# Patient Record
Sex: Male | Born: 1960 | Race: White | Hispanic: No | Marital: Single | Smoking: Never smoker
Health system: Southern US, Community
[De-identification: ages and names within clinical notes are randomized; demographics above are authoritative.]

## PROBLEM LIST (undated history)

## (undated) DIAGNOSIS — K859 Acute pancreatitis without necrosis or infection, unspecified: Secondary | ICD-10-CM

## (undated) DIAGNOSIS — Z86711 Personal history of pulmonary embolism: Secondary | ICD-10-CM

## (undated) DIAGNOSIS — E119 Type 2 diabetes mellitus without complications: Secondary | ICD-10-CM

## (undated) DIAGNOSIS — E785 Hyperlipidemia, unspecified: Secondary | ICD-10-CM

## (undated) DIAGNOSIS — N289 Disorder of kidney and ureter, unspecified: Secondary | ICD-10-CM

## (undated) DIAGNOSIS — R748 Abnormal levels of other serum enzymes: Secondary | ICD-10-CM

## (undated) DIAGNOSIS — I509 Heart failure, unspecified: Secondary | ICD-10-CM

## (undated) DIAGNOSIS — K219 Gastro-esophageal reflux disease without esophagitis: Secondary | ICD-10-CM

## (undated) DIAGNOSIS — I219 Acute myocardial infarction, unspecified: Secondary | ICD-10-CM

## (undated) DIAGNOSIS — E1142 Type 2 diabetes mellitus with diabetic polyneuropathy: Secondary | ICD-10-CM

## (undated) DIAGNOSIS — G4733 Obstructive sleep apnea (adult) (pediatric): Secondary | ICD-10-CM

## (undated) DIAGNOSIS — I10 Essential (primary) hypertension: Secondary | ICD-10-CM

## (undated) HISTORY — PX: VASECTOMY: SHX75

## (undated) HISTORY — DX: Disorder of kidney and ureter, unspecified: N28.9

## (undated) HISTORY — DX: Obstructive sleep apnea (adult) (pediatric): G47.33

## (undated) HISTORY — DX: Type 2 diabetes mellitus without complications: E11.9

## (undated) HISTORY — DX: Hyperlipidemia, unspecified: E78.5

## (undated) HISTORY — PX: CARDIAC SURGERY: SHX584

## (undated) HISTORY — DX: Personal history of pulmonary embolism: Z86.711

## (undated) HISTORY — DX: Acute pancreatitis without necrosis or infection, unspecified: K85.90

## (undated) HISTORY — DX: Gastro-esophageal reflux disease without esophagitis: K21.9

## (undated) HISTORY — DX: Type 2 diabetes mellitus with diabetic polyneuropathy: E11.42

## (undated) HISTORY — DX: Abnormal levels of other serum enzymes: R74.8

## (undated) HISTORY — DX: Essential (primary) hypertension: I10

---

## 2004-03-04 ENCOUNTER — Other Ambulatory Visit: Payer: Self-pay

## 2004-03-26 ENCOUNTER — Other Ambulatory Visit: Payer: Self-pay

## 2004-03-26 ENCOUNTER — Inpatient Hospital Stay: Payer: Self-pay | Admitting: Internal Medicine

## 2004-03-28 ENCOUNTER — Other Ambulatory Visit: Payer: Self-pay

## 2004-05-22 ENCOUNTER — Inpatient Hospital Stay: Payer: Self-pay

## 2004-06-01 ENCOUNTER — Inpatient Hospital Stay: Payer: Self-pay | Admitting: Psychiatry

## 2004-11-21 ENCOUNTER — Ambulatory Visit: Payer: Self-pay

## 2004-12-22 ENCOUNTER — Ambulatory Visit: Payer: Self-pay

## 2005-04-21 ENCOUNTER — Other Ambulatory Visit: Payer: Self-pay

## 2005-04-21 ENCOUNTER — Inpatient Hospital Stay: Payer: Self-pay | Admitting: Psychiatry

## 2005-04-30 ENCOUNTER — Ambulatory Visit: Payer: Self-pay | Admitting: Unknown Physician Specialty

## 2005-05-24 ENCOUNTER — Ambulatory Visit: Payer: Self-pay | Admitting: Unknown Physician Specialty

## 2005-06-24 HISTORY — PX: TRACHEOSTOMY: SUR1362

## 2005-07-05 ENCOUNTER — Inpatient Hospital Stay: Payer: Self-pay | Admitting: Internal Medicine

## 2005-10-03 ENCOUNTER — Ambulatory Visit: Payer: Self-pay | Admitting: Family Medicine

## 2005-10-15 ENCOUNTER — Ambulatory Visit: Payer: Self-pay | Admitting: Specialist

## 2005-10-22 ENCOUNTER — Ambulatory Visit: Payer: Self-pay | Admitting: Family Medicine

## 2005-10-31 ENCOUNTER — Ambulatory Visit: Payer: Self-pay | Admitting: Family Medicine

## 2006-03-12 ENCOUNTER — Inpatient Hospital Stay: Payer: Self-pay

## 2006-03-12 ENCOUNTER — Other Ambulatory Visit: Payer: Self-pay

## 2006-03-25 ENCOUNTER — Emergency Department: Payer: Self-pay | Admitting: Emergency Medicine

## 2006-04-09 ENCOUNTER — Ambulatory Visit: Payer: Self-pay | Admitting: Cardiothoracic Surgery

## 2006-05-29 ENCOUNTER — Ambulatory Visit: Payer: Self-pay | Admitting: Internal Medicine

## 2006-06-03 ENCOUNTER — Ambulatory Visit: Payer: Self-pay | Admitting: Unknown Physician Specialty

## 2006-06-09 ENCOUNTER — Inpatient Hospital Stay: Payer: Self-pay | Admitting: *Deleted

## 2006-06-30 ENCOUNTER — Ambulatory Visit: Payer: Self-pay | Admitting: Unknown Physician Specialty

## 2006-07-12 ENCOUNTER — Ambulatory Visit: Payer: Self-pay | Admitting: Otolaryngology

## 2006-10-27 ENCOUNTER — Other Ambulatory Visit: Payer: Self-pay

## 2006-10-28 ENCOUNTER — Observation Stay: Payer: Self-pay | Admitting: Internal Medicine

## 2007-09-06 ENCOUNTER — Inpatient Hospital Stay: Payer: Self-pay | Admitting: Internal Medicine

## 2007-09-30 ENCOUNTER — Ambulatory Visit: Payer: Self-pay | Admitting: Family Medicine

## 2007-10-05 ENCOUNTER — Ambulatory Visit: Payer: Self-pay | Admitting: Family Medicine

## 2008-01-29 ENCOUNTER — Ambulatory Visit: Payer: Self-pay | Admitting: Family Medicine

## 2008-06-11 ENCOUNTER — Emergency Department: Payer: Self-pay | Admitting: Emergency Medicine

## 2008-10-17 ENCOUNTER — Encounter: Payer: Self-pay | Admitting: Internal Medicine

## 2008-10-20 ENCOUNTER — Encounter: Payer: Self-pay | Admitting: Pulmonary Disease

## 2008-10-22 ENCOUNTER — Encounter: Payer: Self-pay | Admitting: Internal Medicine

## 2009-01-27 ENCOUNTER — Encounter: Payer: Self-pay | Admitting: Family Medicine

## 2009-02-01 ENCOUNTER — Inpatient Hospital Stay: Payer: Self-pay | Admitting: *Deleted

## 2009-02-01 ENCOUNTER — Ambulatory Visit: Payer: Self-pay | Admitting: Cardiovascular Disease

## 2009-08-13 ENCOUNTER — Emergency Department: Payer: Self-pay | Admitting: Unknown Physician Specialty

## 2010-01-22 ENCOUNTER — Ambulatory Visit: Payer: Self-pay

## 2010-05-01 ENCOUNTER — Inpatient Hospital Stay: Payer: Self-pay | Admitting: Internal Medicine

## 2011-12-24 ENCOUNTER — Ambulatory Visit: Payer: Self-pay | Admitting: Pain Medicine

## 2012-01-29 ENCOUNTER — Ambulatory Visit: Payer: Self-pay | Admitting: Pain Medicine

## 2012-04-03 ENCOUNTER — Inpatient Hospital Stay: Payer: Self-pay | Admitting: Internal Medicine

## 2012-04-03 ENCOUNTER — Inpatient Hospital Stay: Payer: Self-pay | Admitting: Psychiatry

## 2012-04-03 DIAGNOSIS — I10 Essential (primary) hypertension: Secondary | ICD-10-CM

## 2012-04-03 LAB — BEHAVIORAL MEDICINE 1 PANEL
Albumin: 3.5 g/dL (ref 3.4–5.0)
Alkaline Phosphatase: 58 U/L (ref 50–136)
BUN: 16 mg/dL (ref 7–18)
Basophil #: 0.1 10*3/uL (ref 0.0–0.1)
Calcium, Total: 8.1 mg/dL — ABNORMAL LOW (ref 8.5–10.1)
EGFR (Non-African Amer.): >60
Eosinophil #: 0.2 10*3/uL (ref 0.0–0.7)
Eosinophil %: 3.1 %
Glucose: 110 mg/dL — ABNORMAL HIGH (ref 65–99)
Lymphocyte #: 2 10*3/uL (ref 1.0–3.6)
MCHC: 35 g/dL (ref 32.0–36.0)
MCV: 95 fL (ref 80–100)
Monocyte %: 8.8 %
Neutrophil %: 58 %
Osmolality: 274 (ref 275–301)
Platelet: 218 10*3/uL (ref 150–440)
Potassium: 4.2 mmol/L (ref 3.5–5.1)
RBC: 3.56 10*6/uL — ABNORMAL LOW (ref 4.40–5.90)
SGOT(AST): 41 U/L — ABNORMAL HIGH (ref 15–37)
SGPT (ALT): 32 U/L (ref 12–78)
Sodium: 136 mmol/L (ref 136–145)
Thyroid Stimulating Horm: 1.24 u[IU]/mL
Total Protein: 6.6 g/dL (ref 6.4–8.2)
WBC: 6.8 10*3/uL (ref 3.8–10.6)

## 2012-04-03 LAB — CARBAMAZEPINE LEVEL, TOTAL: Carbamazepine: 10.8 ug/mL (ref 4.0–12.0)

## 2012-04-03 LAB — ETHANOL: Ethanol %: 0.099 % — ABNORMAL HIGH (ref 0.000–0.080)

## 2013-08-14 ENCOUNTER — Emergency Department: Payer: Self-pay | Admitting: Emergency Medicine

## 2013-08-14 LAB — COMPREHENSIVE METABOLIC PANEL
ALBUMIN: 3.6 g/dL (ref 3.4–5.0)
ALT: 34 U/L (ref 12–78)
AST: 26 U/L (ref 15–37)
Alkaline Phosphatase: 130 U/L — ABNORMAL HIGH
Anion Gap: 7 (ref 7–16)
BUN: 13 mg/dL (ref 7–18)
Bilirubin,Total: 0.3 mg/dL (ref 0.2–1.0)
CALCIUM: 8.8 mg/dL (ref 8.5–10.1)
CO2: 25 mmol/L (ref 21–32)
CREATININE: 0.68 mg/dL (ref 0.60–1.30)
Chloride: 95 mmol/L — ABNORMAL LOW (ref 98–107)
EGFR (African American): >60
Glucose: 267 mg/dL — ABNORMAL HIGH (ref 65–99)
Osmolality: 265 (ref 275–301)
POTASSIUM: 4.4 mmol/L (ref 3.5–5.1)
Sodium: 127 mmol/L — ABNORMAL LOW (ref 136–145)
Total Protein: 7.3 g/dL (ref 6.4–8.2)

## 2013-08-14 LAB — URINALYSIS, COMPLETE
BLOOD: NEGATIVE
Bacteria: NONE SEEN
Bilirubin,UR: NEGATIVE
Glucose,UR: >=500 mg/dL (ref 0–75)
KETONE: NEGATIVE
LEUKOCYTE ESTERASE: NEGATIVE
NITRITE: NEGATIVE
Ph: 5 (ref 4.5–8.0)
Protein: NEGATIVE
RBC,UR: <1 /HPF (ref 0–5)
Specific Gravity: 1.013 (ref 1.003–1.030)
Squamous Epithelial: NONE SEEN
WBC UR: 1 /HPF (ref 0–5)

## 2013-08-14 LAB — CBC
HCT: 38 % — ABNORMAL LOW (ref 40.0–52.0)
HGB: 13.5 g/dL (ref 13.0–18.0)
MCH: 30.1 pg (ref 26.0–34.0)
MCHC: 35.4 g/dL (ref 32.0–36.0)
MCV: 85 fL (ref 80–100)
PLATELETS: 223 10*3/uL (ref 150–440)
RBC: 4.48 10*6/uL (ref 4.40–5.90)
RDW: 13.4 % (ref 11.5–14.5)
WBC: 6.7 10*3/uL (ref 3.8–10.6)

## 2013-08-14 LAB — PRO B NATRIURETIC PEPTIDE: B-Type Natriuretic Peptide: 257 pg/mL — ABNORMAL HIGH (ref 0–125)

## 2013-08-14 LAB — TROPONIN I

## 2013-09-15 ENCOUNTER — Ambulatory Visit: Payer: Self-pay

## 2014-03-29 ENCOUNTER — Ambulatory Visit: Payer: Self-pay | Admitting: Family Medicine

## 2014-04-18 ENCOUNTER — Ambulatory Visit: Payer: Self-pay | Admitting: Internal Medicine

## 2014-05-02 ENCOUNTER — Institutional Professional Consult (permissible substitution): Payer: Medicare Other | Admitting: Internal Medicine

## 2014-05-13 ENCOUNTER — Encounter: Payer: Self-pay | Admitting: Pulmonary Disease

## 2014-05-16 ENCOUNTER — Encounter: Payer: Self-pay | Admitting: Pulmonary Disease

## 2014-05-16 ENCOUNTER — Ambulatory Visit (INDEPENDENT_AMBULATORY_CARE_PROVIDER_SITE_OTHER): Payer: Medicare Other | Admitting: Pulmonary Disease

## 2014-05-16 VITALS — BP 122/70 | HR 67 | Temp 98.2°F | Ht 75.0 in | Wt 360.6 lb

## 2014-05-16 DIAGNOSIS — J398 Other specified diseases of upper respiratory tract: Secondary | ICD-10-CM | POA: Insufficient documentation

## 2014-05-16 DIAGNOSIS — J9610 Chronic respiratory failure, unspecified whether with hypoxia or hypercapnia: Secondary | ICD-10-CM

## 2014-05-16 DIAGNOSIS — E662 Morbid (severe) obesity with alveolar hypoventilation: Secondary | ICD-10-CM | POA: Insufficient documentation

## 2014-05-16 DIAGNOSIS — Z9911 Dependence on respirator [ventilator] status: Secondary | ICD-10-CM | POA: Insufficient documentation

## 2014-05-16 NOTE — Assessment & Plan Note (Signed)
The patient has a history of tracheal stenosis dating back to his episode of chronic vent dependence after acute respiratory failure. He has been evaluated [facility] at [facility], and is felt to need a permanent trach unless he wishes to try more aggressive tracheal resection surgery.

## 2014-05-16 NOTE — Assessment & Plan Note (Signed)
I do not have specific records on the patient, but he did have an elevated serum bicarbonate in August of this year, and has the body habitus that would predispose him to obesity hypoventilation. I suspect this is why he requires mechanical ventilation at night. I would like to get his settings from his home care company, and also check an arterial blood gas first thing in the morning to see if we need to make any adjustments. I suspect he will no longer need the ventilator if he is able to lose significant weight. He obviously is going to need aggressive diuresis, and I will leave that to his primary care physician in [place] who can better monitor his renal function and weights.

## 2014-05-16 NOTE — Progress Notes (Signed)
   Subjective:    Patient ID: [name], male    DOB: 02-Nov-1960, 53 y.o.   MRN: [id number]  HPI The patient is a 53 year old male who I've been asked to see for management of chronic respiratory failure. The patient has a history of severe obstructive sleep apnea, and approximately 7 years ago had an episode of acute respiratory failure that required endotracheal intubation and mechanical ventilation. Those records are not available for my review, and the patient was ventilator dependent 4 months and sent to a chronic care facility. He was ultimately sent home without a tracheostomy, and was evaluated by otolaryngology in [place] and found to have tracheal stenosis. It was felt that he would require a permanent tracheostomy. He was ultimately seen at [facility] where the possibility of a tracheal resection was discussed. These records are unavailable as well, but the patient states they decided on more conservative surgery first. These were unsuccessful, and now the patient is left with a permanent trach unless he decides to go with aggressive tracheal resection surgery. During this time he developed acute on chronic respiratory failure, and was started on mechanical ventilation during sleep only. From his description, it sounds like he was diagnosed with obesity hypoventilation syndrome, and he did have an elevated serum bicarbonate to support this. There are no arterial blood gases available. He has had a recent echocardiogram that showed a normal ejection fraction with left ventricular hypertrophy. He was found to have a normal right ventricle, but the report mentions a membranous VSD. He had mild left atrial enlargement and mitral regurgitation. The patient has never smoked. He gets his ventilator care through [facility], and we will get his current settings sent to us.   Review of Systems  Constitutional: Positive for unexpected weight change. Negative for fever.  HENT: Negative for congestion, dental  problem, ear pain, nosebleeds, postnasal drip, rhinorrhea, sinus pressure, sneezing, sore throat and trouble swallowing.   Eyes: Negative for redness and itching.  Respiratory: Positive for shortness of breath. Negative for cough, chest tightness and wheezing.   Cardiovascular: Negative for palpitations and leg swelling.  Gastrointestinal: Negative for nausea and vomiting.  Genitourinary: Negative for dysuria.  Musculoskeletal: Negative for joint swelling.  Skin: Negative for rash.  Neurological: Negative for headaches.  Hematological: Does not bruise/bleed easily.  Psychiatric/Behavioral: Positive for dysphoric mood. The patient is not nervous/anxious.        Objective:   Physical Exam Constitutional:  Morbidly obese male, no acute distress  HENT:  Nares patent without discharge  Oropharynx without exudate, palate and uvula are thick and elongated.  Eyes:  Perrla, eomi, no scleral icterus  Neck:  No JVD, no TMG, clean trach site with no drainage.   Cardiovascular:  Normal rate, regular rhythm, no rubs or gallops.  No murmurs        Intact distal pulses but decreased.   Pulmonary :  Normal breath sounds, no stridor or respiratory distress   No rales, rhonchi, or wheezing  Abdominal:  Soft, nondistended, bowel sounds present.  No tenderness noted.   Musculoskeletal:  3+ lower extremity edema noted.  Lymph Nodes:  No cervical lymphadenopathy noted  Skin:  No cyanosis noted  Neurologic:  Alert, appropriate, moves all 4 extremities without obvious deficit.         Assessment & Plan:

## 2014-05-16 NOTE — Patient Instructions (Signed)
Will get your information from [facility] regarding your ventilator settings. Will check early morning blood gas at [facility] to see if your carbon dioxide level is overly elevated.  If so, we can make appropriate ventilator changes. Will send a note to your primary physician recommending more aggressive fluid removal, but will need to watch your kidney function closely.  Work on weight loss.  If you are able to lose weight, you will not require the ventilator at night.  followup with me again in 6mos.

## 2014-05-18 ENCOUNTER — Telehealth: Payer: Self-pay | Admitting: Pulmonary Disease

## 2014-05-18 NOTE — Telephone Encounter (Signed)
Noted  

## 2014-05-25 ENCOUNTER — Ambulatory Visit: Payer: Self-pay | Admitting: Pulmonary Disease

## 2014-05-27 ENCOUNTER — Telehealth: Payer: Self-pay | Admitting: Pulmonary Disease

## 2014-05-27 NOTE — Telephone Encounter (Signed)
Let pt know that his ABG looks very good.  No vent setting changes are needed.

## 2014-05-30 ENCOUNTER — Telehealth: Payer: Self-pay | Admitting: Pulmonary Disease

## 2014-05-30 NOTE — Telephone Encounter (Signed)
Pt states he needs to know where he stands as far as [facility] contacting his PCP to discuss fluid retention treatment and to be more aggressive with it to get and keep off the extra fluids. Pt states he has not heard from his PCP and would like to know if and when [facility] contacted the PCP. Pt aware I will send message to [facility] to advise.

## 2014-05-30 NOTE — Telephone Encounter (Signed)
A note has been sent to PCP.  He needs to keep track of his fluid status with daily weights as we discussed, and let his PCP know when weight or swelling is increasing.  Needs to CALL PCP

## 2014-05-30 NOTE — Telephone Encounter (Signed)
Called, spoke with pt.  Discussed below per Dr. Shelle Ironlance with pt.  He verbalized understanding, is in agreement with plan, and reports he will call PCP and voiced no further questions or concerns at this time.

## 2014-05-30 NOTE — Telephone Encounter (Signed)
I spoke with patient about results and he verbalized understanding and had no questions 

## 2014-07-27 ENCOUNTER — Ambulatory Visit: Payer: Self-pay | Admitting: Family Medicine

## 2014-07-29 ENCOUNTER — Emergency Department: Payer: Self-pay | Admitting: Emergency Medicine

## 2014-07-29 LAB — CBC
HCT: 38.3 % — ABNORMAL LOW (ref 40.0–52.0)
HGB: 12.8 g/dL — AB (ref 13.0–18.0)
MCH: 29.6 pg (ref 26.0–34.0)
MCHC: 33.4 g/dL (ref 32.0–36.0)
MCV: 88 fL (ref 80–100)
PLATELETS: 234 10*3/uL (ref 150–440)
RBC: 4.33 10*6/uL — ABNORMAL LOW (ref 4.40–5.90)
RDW: 13.4 % (ref 11.5–14.5)
WBC: 6.5 10*3/uL (ref 3.8–10.6)

## 2014-07-29 LAB — BASIC METABOLIC PANEL
ANION GAP: 10 (ref 7–16)
BUN: 27 mg/dL — ABNORMAL HIGH (ref 7–18)
CHLORIDE: 93 mmol/L — AB (ref 98–107)
CO2: 27 mmol/L (ref 21–32)
CREATININE: 1.36 mg/dL — AB (ref 0.60–1.30)
Calcium, Total: 9.1 mg/dL (ref 8.5–10.1)
EGFR (African American): >60
EGFR (Non-African Amer.): 58 — ABNORMAL LOW
Glucose: 335 mg/dL — ABNORMAL HIGH (ref 65–99)
Osmolality: 279 (ref 275–301)
POTASSIUM: 4.4 mmol/L (ref 3.5–5.1)
Sodium: 130 mmol/L — ABNORMAL LOW (ref 136–145)

## 2014-07-29 LAB — TROPONIN I: Troponin-I: <0.02 ng/mL

## 2014-07-29 LAB — PRO B NATRIURETIC PEPTIDE: B-TYPE NATIURETIC PEPTID: 110 pg/mL (ref 0–125)

## 2014-07-29 LAB — D-DIMER(ARMC): D-DIMER: 252 ng/mL

## 2014-10-11 NOTE — H&P (Signed)
PATIENT NAME:  [name], [name] MR#:  [id number] DATE OF BIRTH:  [birth year]  DATE OF ADMISSION:  04/03/2012  PRIMARY CARE PHYSICIAN: [name] Aldo, MD  HISTORY OF PRESENT ILLNESS: [name] is a 54 year old morbidly obese Caucasian gentleman with multiple medical problems who comes to the Emergency Room for alcohol detox. He was admitted on the Behavior Medicine unit, however, after admission, Internal Medicine was called and requested to transfer the patient to the medical floor given his multiple medical problems including chronic tracheostomy requiring suctioning as needed and felt the patient would be served better on the medical floor. Hence, the patient is being transferred to the medical floor for alcohol detox which will be managed by Dr. Toni [name], his psychiatrist. The patient currently denies any medical symptoms. He did have a couple of falls after he had drank too much alcohol. He is complaining of some aches and pains. He is now being admitted on the medical floor.   PAST MEDICAL HISTORY:  1. Chronic obstructive pulmonary disease with chronic respiratory failure, chronic tracheostomy.  2. Obstructive sleep apnea and history of tracheal stenosis status post tracheostomy. The patient uses BiPAP at home.  3. Diabetes type 2, insulin requiring.  4. Gastroesophageal reflux disease.  5. Bipolar disorder and depression.  6. History of methicillin-resistant Staphylococcus aureus.  7. History of pancreatitis.  8. Coronary artery disease status post stent placement.  9. History of congestive heart failure and pulmonary hypertension.  10. Chronic pain. The patient was seen at the pain clinic by [name] in June of 2013. Opioids are not recommended for him.  ALLERGIES:  Keflex, hydrochlorothiazide, metolazone, penicillin, pine nuts.  MEDICATIONS:  1. Venlafaxine extended-release 1 capsule once a day.  2. Omeprazole 20 mg daily.  3. Insulin sliding scale.  4. Insulin N long-acting 45 units twice  a day. 5. Metoprolol tartrate 100 mg twice a day. 6. Metformin 1000 mg extended-release twice a day.  7. Ibuprofen 200 mg 1 tablet as needed. 8. Gabapentin 1600 mg twice a day.  9. Furosemide 80 mg twice a day and as needed.  10. Enalapril 10 mg twice a day. 11. Crestor 10 mg at bedtime.  12. Plavix 75 mg daily.  13. Carbamazepine 200 mg once in the morning and 2 tablets at bedtime.   SOCIAL HISTORY: The patient lives at home with his girlfriend. He does not smoke but drinks alcohol routinely He did not quantify. He does not use any other drugs.   FAMILY HISTORY: Positive for hypertension.   REVIEW OF SYSTEMS: CONSTITUTIONAL: No fever, fatigue, or weakness. EYES: No blurred or double vision. ENT: No tinnitus, ear pain, or hearing loss. RESPIRATORY: No cough, wheeze, or hemoptysis. Chronic obstructive pulmonary disease/chronic respiratory failure status post tracheostomy. CARDIOVASCULAR: No chest pain, orthopnea, or edema. Positive for hypertension. GI: No nausea, vomiting, diarrhea, or abdominal pain. GU: No dysuria or hematuria. ENDOCRINE: No polyuria or nocturia. HEMATOLOGY: No anemia or easy bruising. SKIN: No acne or rash. MUSCULOSKELETAL: Positive for arthritis. NEUROLOGIC: No cerebrovascular accident or transient ischemic attack. PSYCH: No anxiety. Positive for depression. All other systems reviewed and negative.   RESULTS: White count 6.8, hemoglobin and hematocrit 11.9 and 33.9, and platelet count 218. Glucose 110, BUN 16, creatinine 1.05, sodium 136, potassium 4.2, chloride 97, and bicarbonate 274. Bilirubin is 8.1, SGOT 41, and albumin 3.5. TSH 1.24. Serum Tegretol 10.8.   CT of the head without contrast shows no evidence of acute or focal abnormality.  Serum ethanol level was 0.099.  ASSESSMENT: 54 year old [name] with multiple medical problems who comes to the Emergency Room with alcohol abuse and voluntarily was admitted for alcohol detox. He was initially on the Behavioral  Medicine unit, however, given his comorbidities and medical issues it was felt the patient would be better served on the medicine floor. He is being admitted with:  1. Chronic respiratory failure, with tracheostomy, remains stable. We will continue the patient's p.r.n. suctioning as needed.  2. Obstructive sleep apnea, on BiPAP at home at bedtime. I discussed with a respiratory therapist. We will set up BiPAP as per the patient's home settings. The patient is asked to have a family member bring his own BiPAP machine.  3. Hypertension. Continue home medications.  4. Type 2 diabetes, on Novolin N and sliding scale. We will continue along with metformin.  5. Alcohol intoxication and dependence. Orders to be placed by Dr. Toni Amendlapacs. Initially the patient was admitted on the Behavioral Medicine unit. I did inform the psych supervisor, [name], to inform Dr. Toni Amendlapacs to place alcohol detox orders.  6. History of bipolar disorder, depression, and obsessive-compulsive disorder. We will continue inpatient psych medications which are Tegretol and Effexor.  7. Morbid Obesity. 8. Deep vein thrombosis prophylaxis with heparin subcutaneous three times daily.  Further work-up according to the patient's clinical course. No family members were present in the hospital.   TIME SPENT: 50 minutes.  ____________________________ [name] HailSona A. Allena KatzPatel, MD sap:slb D: 04/03/2012 16:42:25 ET T: 04/03/2012 17:18:26 ET JOB#: [id number]  cc: [name] Zieske A. Allena KatzPatel, MD, <Dictator> [name] "Maryruth HancockSallie" Ean Gettel, MD [name] OraSONA A Roney Youtz MD ELECTRONICALLY SIGNED 04/03/2012 21:00

## 2014-10-11 NOTE — Consult Note (Signed)
Details:    - Psychiatry: Met with patient again after speaking with his girlfriend. Patient is through with detox. I have explained that we do not have rehab services in our hospital and that at this point his goal should be to continue to say off alcohol, which can be done with outpt assistance. This would be best in his case given his continued physical limitations and needs which will make it very diffacult for him to be cared for except in a nursing facility or in his home where they have home health and his home equipment. Patient states he is motivated to stop drinking and that he understands that continued drinking puts him at risk of dying. He has been urged to participate in [facility] programs and to get help through [facility] [place].They have an intensive outpatient program or regular counceling for SA treatment. Patient denies any suicidal ideation and is calm and lucid and expresses optimism about staaying sober.   Electronic Signatures: Gonzella Lex (MD)  (Signed 15-Oct-13 17:40)  Authored: Details   Last Updated: 15-Oct-13 17:40 by Gonzella Lex (MD)

## 2014-10-11 NOTE — Consult Note (Signed)
Brief Consult Note: Diagnosis: alcohol dep.   Patient was seen by consultant.   Recommend further assessment or treatment.   Orders entered.   Discussed with Attending MD.   Comments: Psychiatry: Thank you to Dr Allena KatzPatel for taking this patient to medical service. Primarily in the hospital for alcohol withdrawl but with multiple severe medical problems making management difficult on behavior health. Patient denies suicidal ideation and is not psychotic. Insight is fairly good and he is cooperative with treatment. No evidence at this time to change his baaseline medications for treatment of his bipolar disorder . Patient has a place to live and outpt medical care in place. Unlikely to need [facility]. Will follow. Please call us if needed during hospitalization. Detox orders for CIWA protocol done.  Electronic Signatures: Audery Amellapacs, Jahmere Bramel T (MD)  (Signed 12-Oct-13 02:34)  Authored: Brief Consult Note   Last Updated: 12-Oct-13 02:34 by Audery Amellapacs, Arvis Zwahlen T (MD)

## 2014-10-11 NOTE — Consult Note (Signed)
Brief Consult Note: Diagnosis: alcohol dep.   Patient was seen by consultant.   Consult note dictated.   Recommend further assessment or treatment.   Orders entered.   Discussed with Attending MD.   Comments: Psychiatry: Pt seen. Note done for H&P. Admit voluntary to psych for alcohol detox.  Electronic Signatures: Audery Amellapacs, John T (MD)  (Signed 11-Oct-13 11:06)  Authored: Brief Consult Note   Last Updated: 11-Oct-13 11:06 by Audery Amellapacs, John T (MD)

## 2014-10-11 NOTE — H&P (Signed)
PATIENT NAME:  [name], [name] MR#:  [id number] DATE OF BIRTH:  06-26-1960  DATE OF ADMISSION:  04/03/2012  IDENTIFYING INFORMATION AND CHIEF COMPLAINT: This is a 54 year old man with a history of multiple medical problems and substance abuse and possible bipolar disorder who is admitted to the Emergency Room initially for treatment of falls. Evaluation for alcohol abuse.   CHIEF COMPLAINT: "Yeah, I am an alcoholic."   HISTORY OF PRESENT ILLNESS: Information obtained from the patient and from the chart. Patient reports that his drinking has been increasing recently. He says that he drinks about a pint of gin every day. He feels like it is causing increasing problem for him, making it more difficult for him to walk and take care of himself. He feels like it is probably making his mood worse. He has felt helpless to try and stop it. Recently he has had several falls at home and he admits that the alcohol is probably at least partially to blame. He came into the Emergency Room today because he had fallen while trying to get up and go to the bathroom at home.   PAST PSYCHIATRIC HISTORY: Patient has a long history of alcohol dependence. He denies having had seizures or delirium tremens but reports having had sickness, nausea and tremor with stopping. He says he has been able to stop for up to three years at a time and does have a past history of residential treatment. He denies that he abuses other drugs. Additionally, he has a history of mood disorder. Possibly bipolar disorder. He claims that he had a full-blown psychotic manic episode many years ago when [place]. Since then he has not had a repeat of a manic episode but has had episodes of depression. He has been treated by Dr. Sherin QuarryWeissman and currently is on Tegretol for his mood stabilization as well as Effexor. He denies a history of suicide attempts, denies a history of violence. He has been on Depakote in the past as well.   PAST MEDICAL  HISTORY: Multiple medical problems. Patient is morbidly obese. Has diabetes, insulin-dependent. Also has a history of coronary artery disease status post stents. He has gastric reflux symptoms and chronic pain and dyslipidemia. He also has a history of tracheal stenosis which has resulted in his having a permanent tracheostomy. He needs a BiPAP machine to sleep at night. Patient lives with his girlfriend and says that at home they are able to do all of his home care. He says normally he is able to get around with a walker at home although he has gotten weaker recently.   SOCIAL HISTORY: Lives with girlfriend. Not employed. Social life currently minimal.   MEDICATIONS ON ADMISSION:  1. Effexor plain 75 mg per day.  2. Omeprazole 20 mg per day.  3. Novolin insulin 45 units twice a day.  4. Metoprolol 100 mg twice a day.  5. Metformin 500 mg twice a day.  6. Motrin 200 mg per day as needed for pain.  7. Gabapentin 1600 mg twice a day.  8. Lasix 80 mg twice a day.  9. Enalapril 10 mg twice a day.  10. Crestor 10 mg per day.  11. Plavix 75 mg per day.  12. Carbamazepine 200 mg per day.   ALLERGIES: Cephalexin, hydrochlorothiazide, metolazone, penicillin.   REVIEW OF SYSTEMS: Patient complains of chronic pain primarily in his hands and feet. He says that he has a burning pain and also a numbness in a stocking and glove  distribution. It has been gradually getting worse. He has been going to the pain clinic and now more recently going for narcotic pain management through the [facility]. He says his mood is mildly depressed. Denies suicidal ideation. Denies psychotic symptoms. Says his sleep is poor and interrupted. He feels weak. Chronic numbness and pain in his hands and feet. Feels somewhat hopeless.   MENTAL STATUS EXAM: Chronically ill looking man interviewed in the Emergency Room. He was easy to awake and was cooperative for the most part with the interview. He is only able to speak soft  and with some difficulty covering up his tracheostomy but made a good attempt. Psychomotor activity normal given his medical condition. Affect slightly blunted. Mood stated as being down. Thoughts are lucid with no evidence of loosening of associations or delusional thinking. Denies hallucinations. Denies suicidal or homicidal thinking. Short and long-term memory grossly intact. Insight and judgment adequate. Baseline intelligence normal.   PHYSICAL EXAMINATION:  GENERAL: Patient is overweight at 335 pounds. He is chronically ill appearing. Moves with a great deal of difficulty. He has no identifiable acute skin lesions. He obviously has a tracheostomy in place, it does not appear to be infected.   HEENT: Pupils are equal and reactive. Face is symmetric. Oral mucosa dry.   NECK: Nontender.   MUSCULOSKELETAL: Patient has decreased range of motion at all extremities. Upper extremities are a little bit better. He has really minimal active use of his legs. His strength in his upper extremities is largely normal. Reflexes difficult to obtain. In lower extremities he has decreased muscle strength throughout. Decreased to absent reflexes. He has decreased to minimal sensation to touch in both of his hands and his feet midway up through the calf in a symmetric pattern.    NEUROLOGICAL: His cranial nerves are symmetric and normal.   LUNGS: Clear with no wheezes.   HEART: Regular rate and rhythm.   ABDOMEN: Soft, nontender, normal bowel sounds.   CURRENT VITAL SIGNS: Temperature 98.9, pulse 77, respirations 24, blood pressure 145/83.   ASSESSMENT: This is a 54 year old man with a history of alcohol dependence who has been escalating his drinking. He also is using narcotic pain medicine and while it is clear that he is prescribed it I also see some information in his old chart suggesting that the pain clinic doctors have felt that he probably should not be using narcotics. He is having more difficulty  functioning at home with frequent falls. In addition to his medical problems this is probably at least partially related to his substance abuse. Needs hospitalization for detox and stabilization.   TREATMENT PLAN: Admit to psychiatry. Continue all of his previous medicines. After thinking about it I have decided to cut down on his narcotics to 5 mg Percocet every four hours instead of the 10 mg that he was taking. He will be continued on other psychiatric medicine. Detox protocol is in place. Patient currently is requiring a great deal of nursing care. Nursing staff on the behavioral health unit are concerned about the safety and appropriateness of his treatment on behavioral health. We will get a consult from medicine to evaluate whether he would be better served on the medical ward. Because of his history of MRSA and his limited physical activity he will probably not be able to participate actively in groups outside the unit. Daily therapy and evaluation will be done and we work then on discharge planning.   DIAGNOSIS PRINCIPLE AND PRIMARY:  AXIS I:  Alcohol dependence.   SECONDARY DIAGNOSES:  AXIS I: Bipolar disorder, not otherwise specified    AXIS II: Deferred.   AXIS III:  1. Obesity. 2. Coronary artery disease. 3. Diabetes on insulin. 4. Chronic pain. 5. High blood pressure. 6. Dyslipidemia.  7. Gastric reflux symptoms.  8. Tracheostomy. 9. History of being MRSA positive.   AXIS IV: Severe from his burden of multiple illnesses and limited support.   AXIS V: Functioning at time of evaluation is 40.  ____________________________ Audery Amel, MD jtc:cms D: 04/03/2012 14:50:03 ET T: 04/03/2012 15:07:53 ET JOB#: [id number]  cc: Audery Amel, MD, <Dictator> Audery Amel MD ELECTRONICALLY SIGNED 04/03/2012 17:02

## 2014-10-11 NOTE — Consult Note (Signed)
Details:    - Psychiatry: Patient seen. He is showing normal vital signs and no sign of delirium. Not trenulous. He has a slightly blunted affect but does not appear severely depressed. Denies any suicidal ideation. Is able to sit out of med.   Patient says he does not feel like he is ready to be discharged. He feels like he needs to stay in the hospital longer "to recover from my injuries". He complains of still being sore all over and not able to ambulate as he normally would. I do note that he is still getting fairly frequent doses of prn ativan given per the detox protocol. The protocol runs through the end of today.  As he is still getting detox meds he is not ready for discharge home yet. After being off ativan there will be no psychiatric need for him to still be in the hospital. If he really can't physically care for himself normally in a safe manner at home I'd suggest consideration be given to sending him to a rehab facility. No other acute change to psych treatment. Showing no sign of mania or depression.   Electronic Signatures: Audery Amellapacs, John T (MD)  (Signed 15-Oct-13 11:26)  Authored: Details   Last Updated: 15-Oct-13 11:26 by Audery Amellapacs, John T (MD)

## 2014-10-11 NOTE — Discharge Summary (Signed)
PATIENT NAME:  [name], [name] MR#:  [id number] DATE OF BIRTH:  [birth year]  DATE OF ADMISSION:  04/03/2012 DATE OF DISCHARGE:  04/07/2012  PRIMARY CARE PHYSICIAN: None local.  CONSULTANTS: [name] RasmussenJohn Clapacs, MD - Behavior Medicine.   DISCHARGE DIAGNOSES:  1. Alcohol intoxication and dependence status post detox.  2. Obstructive sleep apnea.  3. Chronic respiratory failure with tracheostomy. 4. Obesity. 5. Weakness.   CONDITION: Stable.   HOME MEDICATIONS:  1. Gabapentin 800 mg p.o. 2 tablets twice a day. 2. Lasix 80 mg p.o. twice a day p.r.n. for weight gain.  3. Metformin 500 mg p.o. 2 tablets twice a day. 4. Enalapril 10 mg p.o. twice a day. 5. Venlafaxine 75 mg p.o. daily. 6. Ibuprofen 200 mg p.o. p.r.n.  7. Crestor 10 mg p.o. at bedtime.  8. Lopressor 100 mg p.o. twice a day. 9. Carbamazepine 200 mg p.o. one tablet in the morning and two tablets at bedtime.  10. Plavix 75 mg p.o. daily.  11. Omeprazole 20 mg p.o. daily.  12. Novolin N 45 units subcutaneous in the morning and at bedtime.  13. Percocet 10/325 mg p.o. 5 times a day p.r.n. for pain.  REFERRALS: The patient needs home physical therapy due to weakness and enabling him to walk.  DIET: Low fat, low cholesterol diet.   ACTIVITY: As tolerated.   FOLLOW-UP CARE: Follow-up with PCP within 1 to 2 weeks. Follow-up with Dr. Toni [name] within 1 week.   HOSPITAL COURSE: The patient is a 54 year old Caucasian male with history of morbid obesity and obstructive sleep apnea who came to the ED for alcohol detox. He was admitted to the Behavior Medicine unit; however, after admission internal medicine was called and requested to transfer the patient to the medical floor due to his multiple medical problems including chronic tracheostomy requiring suctioning p.r.n. So the patient was admitted to the medical service. After admission the patient was treated for detox by Dr. Toni [name]. The patient also has chronic respiratory failure with  tracheostomy, which is stable. The patient has OSA and is on BiPAP at home at bedtime and the patient was treated with the same.  Hypertension has been stable.   Diabetes has been treated with Novolin and sliding scale and metformin.   After admission, the patient complained of weakness and difficulty walking. Physical therapy evaluated the patient and suggested home physical therapy. The patient is clinically stable, is status post detox. I talked with Dr. Toni [name]. He suggested the patient can be discharged today. The patient will be discharged to home with home PT today.   I discussed the patient's discharge plan with the patient, case manager, and nurse.   TIME SPENT: About 36 minutes.  ____________________________ [name] PollackQing Delpha Perko, MD qc:slb D: 04/07/2012 15:18:00 ET     T: 04/08/2012 10:50:09 ET      JOB#: [id number] cc: [name] PollackQing Mikalia Fessel, MD, <Dictator> [name] PollackQING Kellie Murrill MD ELECTRONICALLY SIGNED 04/08/2012 13:56

## 2014-10-11 NOTE — H&P (Signed)
PATIENT NAME:  [name], [name] MR#:  [id number] DATE OF BIRTH:  08/19/1960  DATE OF ADMISSION:  04/03/2012  PRIMARY CARE PHYSICIAN: Sarah Alexa Golebiewski, MD  HISTORY OF PRESENT ILLNESS: [name] is a 54-year-old morbidly obese Caucasian gentleman with multiple medical problems who comes to the Emergency Room for alcohol detox. He was admitted on the Behavior Medicine unit, however, after admission, Internal Medicine was called and requested to transfer the patient to the medical floor given his multiple medical problems including chronic tracheostomy requiring suctioning as needed and felt the patient would be served better on the medical floor. Hence, the patient is being transferred to the medical floor for alcohol detox which will be managed by Dr. Clapacs, his psychiatrist. The patient currently denies any medical symptoms. He did have a couple of falls after he had drank too much alcohol. He is complaining of some aches and pains. He is now being admitted on the medical floor.   PAST MEDICAL HISTORY:  1. Chronic obstructive pulmonary disease with chronic respiratory failure, chronic tracheostomy.  2. Obstructive sleep apnea and history of tracheal stenosis status post tracheostomy. The patient uses BiPAP at home.  3. Diabetes type 2, insulin requiring.  4. Gastroesophageal reflux disease.  5. Bipolar disorder and depression.  6. History of methicillin-resistant Staphylococcus aureus.  7. History of pancreatitis.  8. Coronary artery disease status post stent placement.  9. History of congestive heart failure and pulmonary hypertension.  10. Chronic pain. The patient was seen at the pain clinic by Dr. Naveira in June of 2013. Opioids are not recommended for him.  ALLERGIES:  Keflex, hydrochlorothiazide, metolazone, penicillin, pine nuts.  MEDICATIONS:  1. Venlafaxine extended-release 1 capsule once a day.  2. Omeprazole 20 mg daily.  3. Insulin sliding scale.  4. Insulin N long-acting 45 units twice  a day. 5. Metoprolol tartrate 100 mg twice a day. 6. Metformin 1000 mg extended-release twice a day.  7. Ibuprofen 200 mg 1 tablet as needed. 8. Gabapentin 1600 mg twice a day.  9. Furosemide 80 mg twice a day and as needed.  10. Enalapril 10 mg twice a day. 11. Crestor 10 mg at bedtime.  12. Plavix 75 mg daily.  13. Carbamazepine 200 mg once in the morning and 2 tablets at bedtime.   SOCIAL HISTORY: The patient lives at home with his girlfriend. He does not smoke but drinks alcohol routinely He did not quantify. He does not use any other drugs.   FAMILY HISTORY: Positive for hypertension.   REVIEW OF SYSTEMS: CONSTITUTIONAL: No fever, fatigue, or weakness. EYES: No blurred or double vision. ENT: No tinnitus, ear pain, or hearing loss. RESPIRATORY: No cough, wheeze, or hemoptysis. Chronic obstructive pulmonary disease/chronic respiratory failure status post tracheostomy. CARDIOVASCULAR: No chest pain, orthopnea, or edema. Positive for hypertension. GI: No nausea, vomiting, diarrhea, or abdominal pain. GU: No dysuria or hematuria. ENDOCRINE: No polyuria or nocturia. HEMATOLOGY: No anemia or easy bruising. SKIN: No acne or rash. MUSCULOSKELETAL: Positive for arthritis. NEUROLOGIC: No cerebrovascular accident or transient ischemic attack. PSYCH: No anxiety. Positive for depression. All other systems reviewed and negative.   RESULTS: White count 6.8, hemoglobin and hematocrit 11.9 and 33.9, and platelet count 218. Glucose 110, BUN 16, creatinine 1.05, sodium 136, potassium 4.2, chloride 97, and bicarbonate 274. Bilirubin is 8.1, SGOT 41, and albumin 3.5. TSH 1.24. Serum Tegretol 10.8.   CT of the head without contrast shows no evidence of acute or focal abnormality.  Serum ethanol level was 0.099.     ASSESSMENT: 54-year-old [name] with multiple medical problems who comes to the Emergency Room with alcohol abuse and voluntarily was admitted for alcohol detox. He was initially on the Behavioral  Medicine unit, however, given his comorbidities and medical issues it was felt the patient would be better served on the medicine floor. He is being admitted with:  1. Chronic respiratory failure, with tracheostomy, remains stable. We will continue the patient's p.r.n. suctioning as needed.  2. Obstructive sleep apnea, on BiPAP at home at bedtime. I discussed with a respiratory therapist. We will set up BiPAP as per the patient's home settings. The patient is asked to have a family member bring his own BiPAP machine.  3. Hypertension. Continue home medications.  4. Type 2 diabetes, on Novolin N and sliding scale. We will continue along with metformin.  5. Alcohol intoxication and dependence. Orders to be placed by Dr. Clapacs. Initially the patient was admitted on the Behavioral Medicine unit. I did inform the psych supervisor, Marie, to inform Dr. Clapacs to place alcohol detox orders.  6. History of bipolar disorder, depression, and obsessive-compulsive disorder. We will continue inpatient psych medications which are Tegretol and Effexor.  7. Morbid Obesity. 8. Deep vein thrombosis prophylaxis with heparin subcutaneous three times daily.  Further work-up according to the patient's clinical course. No family members were present in the hospital.   TIME SPENT: 50 minutes.  ____________________________ Kelleen Stolze A. Sherine Cortese, MD sap:slb D: 04/03/2012 16:42:25 ET T: 04/03/2012 17:18:26 ET JOB#: [id number]  cc: Ouida Abeyta A. Amesha Bailey, MD, <Dictator> Sarah "Sallie" Kolden Dupee, MD Adore Kithcart A Zander Ingham MD ELECTRONICALLY SIGNED 04/03/2012 21:00 

## 2014-11-14 ENCOUNTER — Ambulatory Visit: Payer: Medicare Other | Admitting: Pulmonary Disease

## 2015-06-08 ENCOUNTER — Encounter: Payer: Medicare Other | Attending: Surgery | Admitting: Surgery

## 2015-06-08 ENCOUNTER — Other Ambulatory Visit: Payer: Self-pay | Admitting: Surgery

## 2015-06-08 ENCOUNTER — Ambulatory Visit
Admission: RE | Admit: 2015-06-08 | Discharge: 2015-06-08 | Disposition: A | Discharge status: Home / Self Care | Payer: Medicare Other | Source: Ambulatory Visit | Attending: Surgery | Admitting: Surgery

## 2015-06-08 DIAGNOSIS — X58XXXA Exposure to other specified factors, initial encounter: Secondary | ICD-10-CM | POA: Diagnosis not present

## 2015-06-08 DIAGNOSIS — I10 Essential (primary) hypertension: Secondary | ICD-10-CM | POA: Diagnosis not present

## 2015-06-08 DIAGNOSIS — Z794 Long term (current) use of insulin: Secondary | ICD-10-CM | POA: Insufficient documentation

## 2015-06-08 DIAGNOSIS — M7989 Other specified soft tissue disorders: Secondary | ICD-10-CM | POA: Diagnosis present

## 2015-06-08 DIAGNOSIS — E11622 Type 2 diabetes mellitus with other skin ulcer: Secondary | ICD-10-CM | POA: Insufficient documentation

## 2015-06-08 DIAGNOSIS — T25292A Burn of second degree of multiple sites of left ankle and foot, initial encounter: Secondary | ICD-10-CM | POA: Diagnosis not present

## 2015-06-08 DIAGNOSIS — J398 Other specified diseases of upper respiratory tract: Secondary | ICD-10-CM | POA: Insufficient documentation

## 2015-06-08 DIAGNOSIS — I509 Heart failure, unspecified: Secondary | ICD-10-CM | POA: Insufficient documentation

## 2015-06-08 DIAGNOSIS — T25222A Burn of second degree of left foot, initial encounter: Secondary | ICD-10-CM | POA: Insufficient documentation

## 2015-06-08 DIAGNOSIS — E1140 Type 2 diabetes mellitus with diabetic neuropathy, unspecified: Secondary | ICD-10-CM | POA: Insufficient documentation

## 2015-06-08 DIAGNOSIS — J449 Chronic obstructive pulmonary disease, unspecified: Secondary | ICD-10-CM | POA: Diagnosis not present

## 2015-06-08 DIAGNOSIS — Z930 Tracheostomy status: Secondary | ICD-10-CM | POA: Diagnosis not present

## 2015-06-08 DIAGNOSIS — I824Y2 Acute embolism and thrombosis of unspecified deep veins of left proximal lower extremity: Secondary | ICD-10-CM | POA: Diagnosis not present

## 2015-06-08 DIAGNOSIS — I82402 Acute embolism and thrombosis of unspecified deep veins of left lower extremity: Secondary | ICD-10-CM

## 2015-06-08 DIAGNOSIS — M79605 Pain in left leg: Secondary | ICD-10-CM

## 2015-06-09 NOTE — Progress Notes (Signed)
[name], [name] ([id number]) Visit Report for 06/08/2015 Allergy List Details Patient Name: [name], [name]. Date of Service: 06/08/2015 3:00 PM Medical Record Number: [id number] Patient Account Number: [id number] Date of Birth/Sex: 1961-01-13 (54 y.o. Male) Treating RN: Clover Mealy, RN, BSN, [place] Sink Primary Care Physician: Hillery Aldo Other Clinician: Referring Physician: Fulton Mole Treating Physician/Extender: Rudene Re in Treatment: 0 Allergies Active Allergies PCN Severity: Severe cephalexin Severity: Severe hydrochlorothiazide Severity: Moderate Allergy Notes Electronic Signature(s) Signed: 06/08/2015 4:59:39 PM By: Elpidio Eric BSN, RN Entered By: Elpidio Eric on 06/08/2015 16:59:39 [name], [name] ([id number]) -------------------------------------------------------------------------------- Arrival Information Details Patient Name: [name], [name] Date of Service: 06/08/2015 3:00 PM Medical Record Number: [id number] Patient Account Number: [id number] Date of Birth/Sex: [birth year] (54 y.o. Male) Treating RN: Clover Mealy, RN, BSN, [place] Sink Primary Care Physician: Hillery Aldo Other Clinician: Referring Physician: Fulton Mole Treating Physician/Extender: Rudene Re in Treatment: 0 Visit Information Patient Arrived: Ambulatory Arrival Time: 15:15 Accompanied By: SELF Transfer Assistance: None Patient Identification Verified: Yes Secondary Verification Process Yes Completed: Patient Requires Transmission- No Based Precautions: Patient Has Alerts: Yes Patient Alerts: Patient on Blood Thinner plavix, DM 2 ABI [place] BILATERAL >220 Electronic Signature(s) Signed: 06/08/2015 3:46:50 PM By: Curtis Sites Entered By: Curtis Sites on 06/08/2015 15:46:50 [name], [name] ([id number]) -------------------------------------------------------------------------------- Clinic Level of Care Assessment Details Patient Name: [name], [name] Date of Service: 06/08/2015 3:00  PM Medical Record Number: [id number] Patient Account Number: [id number] Date of Birth/Sex: July 08, 1960 (54 y.o. Male) Treating RN: Clover Mealy, RN, BSN, [place] Sink Primary Care Physician: Hillery Aldo Other Clinician: Referring Physician: Fulton Mole Treating Physician/Extender: Rudene Re in Treatment: 0 Clinic Level of Care Assessment Items TOOL 2 Quantity Score  - Use when only an EandM is performed on the INITIAL visit 0 ASSESSMENTS - Nursing Assessment / Reassessment X - General Physical Exam (combine w/ comprehensive assessment (listed just 1 20 below) when performed on new pt. evals) X - Comprehensive Assessment (HX, ROS, Risk Assessments, Wounds Hx, etc.) 1 25 ASSESSMENTS - Wound and Skin Assessment / Reassessment  - Simple Wound Assessment / Reassessment - one wound 0 X - Complex Wound Assessment / Reassessment - multiple wounds 10 5  - Dermatologic / Skin Assessment (not related to wound area) 0 ASSESSMENTS - Ostomy and/or Continence Assessment and Care  - Incontinence Assessment and Management 0  - Ostomy Care Assessment and Management (repouching, etc.) 0 PROCESS - Coordination of Care X - Simple Patient / Family Education for ongoing care 1 15  - Complex (extensive) Patient / Family Education for ongoing care 0  - Staff obtains Chiropractor, Records, Test Results / Process Orders 0  - Staff telephones HHA, Nursing Homes / Clarify orders / etc 0  - Routine Transfer to another Facility (non-emergent condition) 0  - Routine Hospital Admission (non-emergent condition) 0 X - New Admissions / Manufacturing engineer / Ordering NPWT, Apligraf, etc. 1 15  - Emergency Hospital Admission (emergent condition) 0  - Simple Discharge Coordination 0 [name], [name] ([id number])  - Complex (extensive) Discharge Coordination 0 PROCESS - Special Needs  - Pediatric / Minor Patient Management 0  - Isolation Patient Management 0  - Hearing / Language / Visual  special needs 0  - Assessment of Community assistance (transportation, D/C planning, etc.) 0  - Additional assistance / Altered mentation 0  - Support Surface(s) Assessment (bed, cushion, seat, etc.) 0 INTERVENTIONS - Wound Cleansing / Measurement X - Wound Imaging (photographs - any number of wounds) 1 5  - Wound Tracing (  instead of photographs) 0 []  - Simple Wound Measurement - one wound 0 X - Complex Wound Measurement - multiple wounds 10 5 []  - Simple Wound Cleansing - one wound 0 X - Complex Wound Cleansing - multiple wounds 10 5 INTERVENTIONS - Wound Dressings []  - Small Wound Dressing one or multiple wounds 0 X - Medium Wound Dressing one or multiple wounds 10 15 []  - Large Wound Dressing one or multiple wounds 0 []  - Application of Medications - injection 0 INTERVENTIONS - Miscellaneous []  - External ear exam 0 []  - Specimen Collection (cultures, biopsies, blood, body fluids, etc.) 0 []  - Specimen(s) / Culture(s) sent or taken to Lab for analysis 0 []  - Patient Transfer (multiple staff / Nurse, adult / Similar devices) 0 []  - Simple Staple / Suture removal (25 or less) 0 []  - Complex Staple / Suture removal (26 or more) 0 [name], [name] ([id number]) []  - Hypo / Hyperglycemic Management (close monitor of Blood Glucose) 0 X - Ankle / Brachial Index (ABI) - do not check if billed separately 1 15 Has the patient been seen at the hospital within the last three years: Yes Total Score: 395 Level Of Care: New/Established - Level 5 Electronic Signature(s) Signed: 06/08/2015 5:19:40 PM By: Elpidio Eric BSN, RN Entered By: Elpidio Eric on 06/08/2015 16:13:42 [name], [name] ([id number]) -------------------------------------------------------------------------------- Encounter Discharge Information Details Patient Name: [name], [name] Date of Service: 06/08/2015 3:00 PM Medical Record Number: [id number] Patient Account Number: [id number] Date of Birth/Sex: [birth year] (55 y.o.  Male) Treating RN: Clover Mealy, RN, BSN, [place] Sink Primary Care Physician: Hillery Aldo Other Clinician: Referring Physician: Fulton Mole Treating Physician/Extender: Rudene Re in Treatment: 0 Encounter Discharge Information Items Discharge Pain Level: 0 Discharge Condition: Stable Ambulatory Status: Ambulatory Discharge Destination: Home Transportation: Private Auto Accompanied By: self Schedule Follow-up Appointment: No Medication Reconciliation completed and provided to Patient/Care No [name]: Provided on Clinical Summary of Care: 06/08/2015 Form Type Recipient Paper Patient JC Electronic Signature(s) Signed: 06/08/2015 5:19:40 PM By: Elpidio Eric BSN, RN Previous Signature: 06/08/2015 4:13:48 PM Version By: Gwenlyn Perking Entered By: Elpidio Eric on 06/08/2015 16:17:02 Cuellar, [name] ([id number]) -------------------------------------------------------------------------------- Lower Extremity Assessment Details Patient Name: Fangman, Tydus P. Date of Service: 06/08/2015 3:00 PM Medical Record Number: [id number] Patient Account Number: [id number] Date of Birth/Sex: 07-13-1960 (54 y.o. Male) Treating RN: Curtis Sites Primary Care Physician: Hillery Aldo Other Clinician: Referring Physician: Fulton Mole Treating Physician/Extender: Rudene Re in Treatment: 0 Edema Assessment Assessed: [Left: No] [Right: No] Edema: [Left: Yes] [Right: Yes] Calf Left: Right: Point of Measurement: 37 cm From Medial Instep 51 cm 44.5 cm Ankle Left: Right: Point of Measurement: 12 cm From Medial Instep 34.3 cm 26.5 cm Vascular Assessment Pulses: Posterior Tibial Palpable: [Left:No] [Right:No] Doppler: [Left:Inaudible] [Right:Monophasic] Dorsalis Pedis Palpable: [Left:No] [Right:Yes] Doppler: [Left:Monophasic] Extremity colors, hair growth, and conditions: Extremity Color: [Left:Red] [Right:Normal] Hair Growth on Extremity: [Left:No] [Right:No] Temperature of Extremity:  [Left:Warm] [Right:Warm] Capillary Refill: [Left:< 3 seconds] [Right:< 3 seconds] Toe Nail Assessment Left: Right: Thick: No No Discolored: No No Deformed: No No Improper Length and Hygiene: No No Notes ABI [place] BILATERAL >220 [name], Koray P. ([id number]) Electronic Signature(s) Signed: 06/08/2015 3:47:10 PM By: Curtis Sites Previous Signature: 06/08/2015 3:46:28 PM Version By: Curtis Sites Entered By: Curtis Sites on 06/08/2015 15:47:10 [name], [name] ([id number]) -------------------------------------------------------------------------------- Multi Wound Chart Details Patient Name: [name], [name] Logan P. Date of Service: 06/08/2015 3:00 PM Medical Record Number: [id number] Patient Account Number: [id number] Date of Birth/Sex: Oct 26, 1960 (54 y.o.  Male) Treating RN: Afful, RN, BSN, [place] Sink Primary Care Physician: Hillery Aldo Other Clinician: Referring Physician: Fulton Mole Treating Physician/Extender: Rudene Re in Treatment: 0 Vital Signs Height(in): 75 Pulse(bpm): 62 Weight(lbs): 321 Blood Pressure 126/66 (mmHg): Body Mass Index(BMI): 40 Temperature(F): 98.1 Respiratory Rate 20 (breaths/min): Photos: [1:No Photos] [10:No Photos] [2:No Photos] Wound Location: [1:Left Lower Leg - Proximal Left Lower Leg - Lateral Left Lower Leg - Distal] Wounding Event: [1:Thermal Burn] [10:Thermal Burn] [2:Thermal Burn] Primary Etiology: [1:2nd degree Burn] [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] Comorbid History: [1:Arrhythmia, Hypertension, Arrhythmia, Hypertension, Arrhythmia, Hypertension, Type II Diabetes, Neuropathy] [10:Type II Diabetes, Neuropathy] [2:Type II Diabetes, Neuropathy] Date Acquired: [1:05/29/2015] [10:05/29/2015] [2:05/29/2015] Weeks of Treatment: [1:0] [10:0] [2:0] Wound Status: [1:Open] [10:Open] [2:Open] Measurements L x W x D 2.4x3.6x0.1 [10:5x1.5x0.1] [2:3.4x12.2x0.2] (cm) Area (cm) : [1:6.786] [10:5.89] [2:32.578] Volume (cm)  : [1:0.679] [10:0.589] [2:6.516] Classification: [1:Full Thickness Without Exposed Support Structures] [10:Grade 1] [2:Full Thickness Without Exposed Support Structures] HBO Classification: [1:Grade 1] [10:N/A] [2:Grade 1] Exudate Amount: [1:Medium] [10:Medium] [2:Medium] Exudate Type: [1:Serosanguineous] [10:Serosanguineous] [2:Serosanguineous] Exudate Color: [1:red, brown] [10:red, brown] [2:red, brown] Wound Margin: [1:Distinct, outline attached Distinct, outline attached Distinct, outline attached] Granulation Amount: [1:Medium (34-66%)] [10:Small (1-33%)] [2:Large (67-100%)] Granulation Quality: [1:Pink, Pale] [10:Pink, Pale] [2:Red, Pink] Necrotic Amount: [1:Medium (34-66%)] [10:Medium (34-66%)] [2:None Present (0%)] Necrotic Tissue: [1:Adherent Slough] [10:Adherent Slough] [2:N/A] Exposed Structures: [1:Fascia: No Fat: No Tendon: No] [10:Fascia: No Fat: No Tendon: No] [2:Fascia: No Fat: No Tendon: No] Muscle: No Muscle: No Muscle: No Joint: No Joint: No Joint: No Bone: No Bone: No Bone: No Limited to Skin Limited to Skin Limited to Skin Breakdown Breakdown Breakdown Epithelialization: None None None Periwound Skin Texture: Edema: Yes Edema: No Edema: No Excoriation: No Excoriation: No Excoriation: No Induration: No Induration: No Induration: No Callus: No Callus: No Callus: No Crepitus: No Crepitus: No Crepitus: No Fluctuance: No Fluctuance: No Fluctuance: No Friable: No Friable: No Friable: No Rash: No Rash: No Rash: No Scarring: No Scarring: No Scarring: No Periwound Skin Moist: Yes Moist: Yes Moist: Yes Moisture: Maceration: No Maceration: No Maceration: No Dry/Scaly: No Dry/Scaly: No Dry/Scaly: No Periwound Skin Color: Erythema: Yes Atrophie Blanche: No Atrophie Blanche: No Atrophie Blanche: No Cyanosis: No Cyanosis: No Cyanosis: No Ecchymosis: No Ecchymosis: No Ecchymosis: No Erythema: No Erythema: No Hemosiderin Staining:  No Hemosiderin Staining: No Hemosiderin Staining: No Mottled: No Mottled: No Mottled: No Pallor: No Pallor: No Pallor: No Rubor: No Rubor: No Rubor: No Erythema Location: Circumferential N/A N/A Erythema Measurement: Marked N/A N/A Temperature: No Abnormality No Abnormality No Abnormality Tenderness on No No No Palpation: Wound Preparation: Ulcer Cleansing: Ulcer Cleansing: Ulcer Cleansing: Rinsed/Irrigated with Rinsed/Irrigated with Rinsed/Irrigated with Saline Saline Saline Topical Anesthetic Topical Anesthetic Topical Anesthetic Applied: Other: lidocaine Applied: Other: lidocaine Applied: Other: lidocaine 4% 4% 4% Wound Number: [id number]  Photos: No Photos No Photos No Photos Wound Location: Left Toe Great Left Toe Second Left Toe Third Wounding Event: Thermal Burn Thermal Burn Thermal Burn Primary Etiology: Diabetic Wound/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 ([id number]) Wound Status: Open Open Open Measurements L x W x D 3.4x2.2x0.1 1.2x1.3x0.1 3x1.5x0.1 (cm) Area (cm) : 5.875 1.225 3.534 Volume (cm) : 0.587 0.123 0.353 Classification: Grade 1  Grade 1 Grade 1 HBO Classification: N/A N/A N/A Exudate Amount: Small Medium Medium Exudate Type: Serosanguineous N/A Serosanguineous Exudate Color: red, brown N/A red, brown Wound Margin: Distinct, outline attached Distinct, outline attached Distinct, outline attached Granulation Amount: Medium (34-66%) None Present (0%) None Present (0%) Granulation Quality: Pink, Pale N/A N/A Necrotic Amount: Medium (34-66%) Large (67-100%) Large (67-100%) Necrotic Tissue: Adherent Slough  Adherent [employer] Exposed Structures: Fascia: No Fascia: No Fascia: No Fat: No Fat: No Fat: No Tendon: No Tendon: No Tendon: No Muscle: No Muscle: No Muscle: No Joint: No Joint: No Joint: No Bone: No Bone: No Bone: No Limited to Skin Limited to Skin Limited to Skin Breakdown Breakdown Breakdown Epithelialization: None None None Periwound Skin Texture: Edema: Yes Edema: No Edema: No Excoriation: No Excoriation: No Excoriation: No Induration: No Induration: No Induration: No Callus: No Callus: No Callus: No Crepitus: No Crepitus: No Crepitus: No Fluctuance: No Fluctuance: No Fluctuance: No Friable: No Friable: No Friable: No Rash: No Rash: No Rash: No Scarring: No Scarring: No Scarring: No Periwound Skin Moist: Yes Moist: Yes Moist: Yes Moisture: Maceration: No Maceration: No Maceration: No Dry/Scaly: No Dry/Scaly: No Dry/Scaly: No Periwound Skin Color: Atrophie Blanche: No Atrophie Blanche: No Atrophie Blanche: No Cyanosis: No Cyanosis: No Cyanosis: No Ecchymosis: No Ecchymosis: No Ecchymosis: No Erythema: No Erythema: No Erythema: No Hemosiderin Staining: No Hemosiderin Staining: No Hemosiderin Staining: No Mottled: No Mottled: No Mottled: No Pallor: No Pallor: No Pallor: No Rubor: No Rubor: No Rubor: No Erythema Location: N/A N/A N/A Erythema Measurement: N/A N/A N/A Temperature: No Abnormality No Abnormality No Abnormality Tenderness on No No No Palpation: Wound Preparation: Liz, Herson P. 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 the Lower Extremity the Lower Extremity Comorbid History: Arrhythmia, Hypertension, Arrhythmia, Hypertension, Arrhythmia, Hypertension, Type II Diabetes, Type II Diabetes, Type II Diabetes, Neuropathy Neuropathy Neuropathy Date Acquired: 05/29/2015 05/29/2015 05/29/2015 Weeks of Treatment: 0 0 0 Wound Status: Open Open Open Measurements L x W x D 2x1.1x0.1 2x0.4x0.1 1.1x0.3x0.1 (cm) Area (cm) : 1.728 0.628 0.259 Volume (cm) : 0.173 0.063 0.026 Classification: Grade 1 Grade 1 Grade 1 HBO Classification: N/A N/A N/A Exudate Amount: Small None Present None Present Exudate Type: Serosanguineous N/A N/A Exudate Color: red, brown N/A N/A Wound Margin: Distinct, outline attached Distinct, outline attached Distinct, outline attached Granulation Amount: None Present (0%) None Present (0%) None Present (0%) Granulation Quality: N/A N/A N/A Necrotic Amount: Large (67-100%) Large (67-100%) Large (67-100%) Necrotic Tissue: Adherent Slough Eschar Eschar Exposed Structures: Fascia: No Fascia: No Fascia: No Fat: No Fat: No Fat: No Tendon: No Tendon: No Tendon: No Muscle: No Muscle: No Muscle: No Joint: No Joint: No Joint: No Bone: No Bone: No Bone: No Limited to Skin Limited to Skin Limited to Skin Breakdown Breakdown Breakdown Epithelialization: None None None Periwound Skin Texture: Edema: Yes Edema: No Edema: No Excoriation: No Excoriation: No Excoriation: No Induration: No Induration: No Induration: No Callus: No Callus: No Callus: No Crepitus: No Crepitus: No Crepitus: No Fluctuance: No Fluctuance: No Fluctuance: No Friable: No Friable: No Friable:  No Wisener, Jeramine P. ([id number]) Rash: No Rash: No Rash: No Scarring: No Scarring: No Scarring: No Periwound Skin Moist: Yes Dry/Scaly:  Yes Dry/Scaly: Yes Moisture: Maceration: No Maceration: No Maceration: No Dry/Scaly: No Moist: No Moist: No Periwound Skin Color: Atrophie Blanche: No Atrophie Blanche: No Atrophie Blanche: No Cyanosis: No Cyanosis: No Cyanosis: No Ecchymosis: No Ecchymosis: No Ecchymosis: No Erythema: No Erythema: No Erythema: No Hemosiderin Staining: No Hemosiderin Staining: No Hemosiderin Staining: No Mottled: No Mottled: No Mottled: No Pallor: No Pallor: No Pallor: No Rubor: No Rubor: No Rubor: No Erythema Location: N/A N/A N/A Erythema Measurement: N/A N/A N/A Temperature: No Abnormality No Abnormality No Abnormality Tenderness on No No No Palpation: Wound Preparation: Ulcer Cleansing: Ulcer Cleansing: Ulcer Cleansing: Rinsed/Irrigated with Rinsed/Irrigated with Rinsed/Irrigated with Saline Saline Saline Topical Anesthetic Topical Anesthetic Topical Anesthetic Applied: Other: lidocaine Applied: Other: Applied: Other: 4% LIDOCAINE 4% LIDOCAINE 4% Wound Number: 9 N/A N/A Photos: No Photos N/A N/A Wound Location: Right Toe Fourth N/A N/A Wounding Event: Thermal Burn N/A N/A Primary Etiology: Diabetic Wound/Ulcer of N/A N/A the Lower Extremity Comorbid History: Arrhythmia, Hypertension, N/A N/A Type II Diabetes, Neuropathy Date Acquired: 05/28/2015 N/A N/A Weeks of Treatment: 0 N/A N/A Wound Status: Open N/A N/A Measurements L x W x D 1.3x0.4x0.1 N/A N/A (cm) Area (cm) : 0.408 N/A N/A Volume (cm) : 0.041 N/A N/A Classification: Grade 1 N/A N/A HBO Classification: N/A N/A N/A Exudate Amount: None Present N/A N/A Exudate Type: N/A N/A N/A Exudate Color: N/A N/A N/A Wound Margin: Distinct, outline attached N/A N/A Granulation Amount: None Present (0%) N/A N/A Boatman, Terel P. ([id number]) Granulation Quality: N/A N/A N/A Necrotic Amount: Large (67-100%) N/A N/A Necrotic Tissue: Eschar N/A N/A Exposed Structures: Fascia: No N/A N/A Fat: No Tendon: No Muscle:  No Joint: No Bone: No Limited to Skin Breakdown Epithelialization: None N/A N/A Periwound Skin Texture: Edema: No N/A N/A Excoriation: No Induration: No Callus: No Crepitus: No Fluctuance: No Friable: No Rash: No Scarring: No Periwound Skin Dry/Scaly: Yes N/A N/A Moisture: Maceration: No Moist: No Periwound Skin Color: Atrophie Blanche: No N/A N/A Cyanosis: No Ecchymosis: No Erythema: No Hemosiderin Staining: No Mottled: No Pallor: No Rubor: No Erythema Location: N/A N/A N/A Erythema Measurement: N/A N/A N/A Temperature: No Abnormality N/A N/A Tenderness on No N/A N/A Palpation: Wound Preparation: Ulcer Cleansing: N/A N/A Rinsed/Irrigated with Saline Topical Anesthetic Applied: Other: lidocaine 4% Treatment Notes Electronic Signature(s) Signed: 06/08/2015 5:19:40 PM By: Elpidio Eric BSN, RN Errickson, Jorden Demetrius Charity ([id number]) Entered By: Elpidio Eric on 06/08/2015 16:01:44 [name], [name] ([id number]) -------------------------------------------------------------------------------- Multi-Disciplinary Care Plan Details Patient Name: Minus, Rhian P. Date of Service: 06/08/2015 3:00 PM Medical Record Number: [id number] Patient Account Number: [id number] Date of Birth/Sex: 1960/07/14 (54 y.o. Male) Treating RN: Clover Mealy, RN, BSN, [place] Sink Primary Care Physician: Hillery Aldo Other Clinician: Referring Physician: Fulton Mole Treating Physician/Extender: Rudene Re in Treatment: 0 Active Inactive Orientation to the Wound Care Program Nursing Diagnoses: Knowledge deficit related to the wound healing center program Goals: Patient/caregiver will verbalize understanding of the Wound Healing Center Program Date Initiated: 06/08/2015 Goal Status: Active Interventions: Provide education on orientation to the wound center Notes: Wound/Skin Impairment Nursing Diagnoses: Impaired tissue integrity Knowledge deficit related to ulceration/compromised skin  integrity Goals: Patient/caregiver will verbalize understanding of skin care regimen Date Initiated: 06/08/2015 Goal Status: Active Ulcer/skin breakdown will have a volume reduction of 30% by week 4 Date Initiated: 06/08/2015 Goal Status: Active Ulcer/skin breakdown will have a volume reduction of 50%  by week 8 Date Initiated: 06/08/2015 Goal Status: Active Ulcer/skin breakdown will have a volume reduction of 80% by week 12 Date Initiated: 06/08/2015 Goal Status: Active Ulcer/skin breakdown will heal within 14 weeks Date Initiated: 06/08/2015 KAMERIN, GRUMBINE ([id number]) Goal Status: Active Interventions: Assess patient/caregiver ability to obtain necessary supplies Assess patient/caregiver ability to perform ulcer/skin care regimen upon admission and as needed Assess ulceration(s) every visit Provide education on smoking Provide education on ulcer and skin care Notes: Electronic Signature(s) Signed: 06/08/2015 5:19:40 PM By: Elpidio Eric BSN, RN Entered By: Elpidio Eric on 06/08/2015 16:01:31 Metsker, [name] ([id number]) -------------------------------------------------------------------------------- Pain Assessment Details Patient Name: Fendrick, Raynor P. Date of Service: 06/08/2015 3:00 PM Medical Record Number: [id number] Patient Account Number: [id number] Date of Birth/Sex: 06/16/1961 (54 y.o. Male) Treating RN: Clover Mealy, RN, BSN, [place] Sink Primary Care Physician: Hillery Aldo Other Clinician: Referring Physician: Fulton Mole Treating Physician/Extender: Rudene Re in Treatment: 0 Active Problems Location of Pain Severity and Description of Pain Patient Has Paino No Site Locations Pain Management and Medication Current Pain Management: Electronic Signature(s) Signed: 06/08/2015 5:19:40 PM By: Elpidio Eric BSN, RN Entered By: Elpidio Eric on 06/08/2015 15:18:41 Stetson, [name]  ([id number]) -------------------------------------------------------------------------------- Patient/Caregiver Education Details Patient Name: [name]. Date of Service: 06/08/2015 3:00 PM Medical Record Number: [id number] Patient Account Number: [id number] Date of Birth/Gender: [birth year] (54 y.o. Male) Treating RN: Clover Mealy, RN, BSN, [place] Sink Primary Care Physician: Hillery Aldo Other Clinician: Referring Physician: Fulton Mole Treating Physician/Extender: Rudene Re in Treatment: 0 Education Assessment Education Provided To: Patient Education Topics Provided Basic Hygiene: Methods: Explain/Verbal Responses: State content correctly Smoking and Wound Healing: Methods: Explain/Verbal Responses: State content correctly Welcome To The [facility]: Methods: Explain/Verbal Responses: State content correctly Wound/Skin Impairment: Methods: Explain/Verbal Responses: State content correctly Electronic Signature(s) Signed: 06/08/2015 5:19:40 PM By: Elpidio Eric BSN, RN Entered By: Elpidio Eric on 06/08/2015 16:17:23 Costlow, [name] ([id number]) -------------------------------------------------------------------------------- Wound Assessment Details Patient Name: Renz, [name] P. Date of Service: 06/08/2015 3:00 PM Medical Record Number: [id number] Patient Account Number: [id number] Date of Birth/Sex: 1961/03/25 (54 y.o. Male) Treating RN: Clover Mealy, RN, BSN, [place] Sink Primary Care Physician: Hillery Aldo Other Clinician: Referring Physician: Fulton Mole Treating Physician/Extender: Rudene Re in Treatment: 0 Wound Status Wound Number: 1 Primary 2nd degree Burn Etiology: Wound Location: Left Lower Leg - Proximal Wound Open Wounding Event: Thermal Burn Status: Date Acquired: 05/29/2015 Comorbid Arrhythmia, Hypertension, Type II Weeks Of Treatment: 0 History: Diabetes, Neuropathy Clustered Wound: No Photos Photo Uploaded By: Curtis Sites on 06/08/2015  16:50:50 Wound Measurements Length: (cm) 2.4 Width: (cm) 3.6 Depth: (cm) 0.1 Area: (cm) 6.786 Volume: (cm) 0.679 % Reduction in Area: % Reduction in Volume: Epithelialization: None Tunneling: No Undermining: No Wound Description Full Thickness Without Exposed Foul Odor Classification: Support Structures Diabetic Severity Grade 1 (Wagner): Wound Margin: Distinct, outline attached Exudate Amount: Medium Exudate Type: Serosanguineous Exudate Color: red, brown After Cleansing: No Wound Bed Granulation Amount: Medium (34-66%) Exposed Structure Granulation Quality: Pink, Pale Fascia Exposed: No Weilbacher, Dandra P. ([id number]) Necrotic Amount: Medium (34-66%) Fat Layer Exposed: No Necrotic Quality: Adherent Slough Tendon Exposed: No Muscle Exposed: No Joint Exposed: No Bone Exposed: No Limited to Skin Breakdown Periwound Skin Texture Texture Color No Abnormalities Noted: No No Abnormalities Noted: No Callus: No Atrophie Blanche: No Crepitus: No Cyanosis: No Excoriation: No Ecchymosis: No Fluctuance: No Erythema: Yes Friable: No Erythema Location: Circumferential Induration: No Erythema Measurement: Marked Localized Edema: Yes Hemosiderin Staining: No Rash: No Mottled: No Scarring: No Pallor:  No Rubor: No Moisture No Abnormalities Noted: No Temperature / Pain Dry / Scaly: No Temperature: No Abnormality Maceration: No Moist: Yes Wound Preparation Ulcer Cleansing: Rinsed/Irrigated with Saline Topical Anesthetic Applied: Other: lidocaine 4%, Treatment Notes Wound #1 (Left, Proximal Lower Leg) 1. Cleansed with: Clean wound with Normal Saline 4. Dressing Applied: Silvadene Cream 5. Secondary Dressing Applied Gauze and Kerlix/Conform 7. Secured with Secretary/administrator) Signed: 06/08/2015 5:19:40 PM By: Elpidio Eric BSN, RN Entered By: Elpidio Eric on 06/08/2015 15:33:28 Bussey, [name]  ([id number]) -------------------------------------------------------------------------------- Wound Assessment Details Patient Name: Dimarco, Rachard P. Date of Service: 06/08/2015 3:00 PM Medical Record Number: [id number] Patient Account Number: [id number] Date of Birth/Sex: 06-27-1960 (54 y.o. Male) Treating RN: Clover Mealy, RN, BSN, [place] Sink Primary Care Physician: Hillery Aldo Other Clinician: Referring Physician: Fulton Mole Treating Physician/Extender: Rudene Re in Treatment: 0 Wound Status Wound Number: 10 Primary Diabetic Wound/Ulcer of the Lower Etiology: Extremity Wound Location: Left Lower Leg - Lateral Wound Open Wounding Event: Thermal Burn Status: Date Acquired: 05/29/2015 Comorbid Arrhythmia, Hypertension, Type II Weeks Of Treatment: 0 History: Diabetes, Neuropathy Clustered Wound: No Photos Photo Uploaded By: Curtis Sites on 06/08/2015 16:50:51 Wound Measurements Length: (cm) 5 Width: (cm) 1.5 Depth: (cm) 0.1 Area: (cm) 5.89 Volume: (cm) 0.589 % Reduction in Area: % Reduction in Volume: Epithelialization: None Tunneling: No Undermining: No Wound Description Classification: Grade 1 Wound Margin: Distinct, outline attached Exudate Amount: Medium Exudate Type: Serosanguineous Exudate Color: red, brown Foul Odor After Cleansing: No Wound Bed Granulation Amount: Small (1-33%) Exposed Structure Granulation Quality: Pink, Pale Fascia Exposed: No Necrotic Amount: Medium (34-66%) Fat Layer Exposed: No Necrotic Quality: Adherent Slough Tendon Exposed: No Varnell, Kavi P. ([id number]) Muscle Exposed: No Joint Exposed: No Bone Exposed: No Limited to Skin Breakdown Periwound Skin Texture Texture Color No Abnormalities Noted: No No Abnormalities Noted: No Callus: No Atrophie Blanche: No Crepitus: No Cyanosis: No Excoriation: No Ecchymosis: No Fluctuance: No Erythema: No Friable: No Hemosiderin Staining: No Induration: No Mottled: No Localized  Edema: No Pallor: No Rash: No Rubor: No Scarring: No Temperature / Pain Moisture Temperature: No Abnormality No Abnormalities Noted: No Dry / Scaly: No Maceration: No Moist: Yes Wound Preparation Ulcer Cleansing: Rinsed/Irrigated with Saline Topical Anesthetic Applied: Other: lidocaine 4%, Treatment Notes Wound #10 (Left, Lateral Lower Leg) 1. Cleansed with: Clean wound with Normal Saline 4. Dressing Applied: Silvadene Cream 5. Secondary Dressing Applied Gauze and Kerlix/Conform 7. Secured with Secretary/administrator) Signed: 06/08/2015 5:19:40 PM By: Elpidio Eric BSN, RN Entered By: Elpidio Eric on 06/08/2015 15:59:57 Castellana, [name] ([id number]) -------------------------------------------------------------------------------- Wound Assessment Details Patient Name: Gerald, Amaru P. Date of Service: 06/08/2015 3:00 PM Medical Record Number: [id number] Patient Account Number: [id number] Date of Birth/Sex: Jun 28, 1960 (54 y.o. Male) Treating RN: Clover Mealy, RN, BSN,  Sink Primary Care Physician: Hillery Aldo Other Clinician: Referring Physician: Fulton Mole Treating Physician/Extender: Rudene Re in Treatment: 0 Wound Status Wound Number: 2 Primary 2nd degree Burn Etiology: Wound Location: Left Lower Leg - Distal Wound Open Wounding Event: Thermal Burn Status: Date Acquired: 05/29/2015 Comorbid Arrhythmia, Hypertension, Type II Weeks Of Treatment: 0 History: Diabetes, Neuropathy Clustered Wound: No Photos Photo Uploaded By: Curtis Sites on 06/08/2015 16:51:25 Wound Measurements Length: (cm) 3.4 Width: (cm) 12.2 Depth: (cm) 0.2 Area: (cm) 32.578 Volume: (cm) 6.516 % Reduction in Area: % Reduction in Volume: Epithelialization: None Tunneling: No Undermining: No Wound Description Full Thickness Without Exposed Foul Odor Classification: Support Structures Diabetic Severity Grade 1 (Wagner): Wound Margin: Distinct, outline attached Exudate  Amount:  Medium Exudate Type: Serosanguineous Exudate Color: red, brown After Cleansing: No Wound Bed Granulation Amount: Large (67-100%) Exposed Structure Granulation Quality: Red, Pink Fascia Exposed: No Vonderhaar, Deklin P. ([id number]) Necrotic Amount: None Present (0%) Fat Layer Exposed: No Tendon Exposed: No Muscle Exposed: No Joint Exposed: No Bone Exposed: No Limited to Skin Breakdown Periwound Skin Texture Texture Color No Abnormalities Noted: No No Abnormalities Noted: No Callus: No Atrophie Blanche: No Crepitus: No Cyanosis: No Excoriation: No Ecchymosis: No Fluctuance: No Erythema: No Friable: No Hemosiderin Staining: No Induration: No Mottled: No Localized Edema: No Pallor: No Rash: No Rubor: No Scarring: No Temperature / Pain Moisture Temperature: No Abnormality No Abnormalities Noted: No Dry / Scaly: No Maceration: No Moist: Yes Wound Preparation Ulcer Cleansing: Rinsed/Irrigated with Saline Topical Anesthetic Applied: Other: lidocaine 4%, Treatment Notes Wound #2 (Left, Distal Lower Leg) 1. Cleansed with: Clean wound with Normal Saline 4. Dressing Applied: Silvadene Cream 5. Secondary Dressing Applied Gauze and Kerlix/Conform 7. Secured with Secretary/administrator) Signed: 06/08/2015 5:19:40 PM By: Elpidio Eric BSN, RN Entered By: Elpidio Eric on 06/08/2015 15:34:42 Holsapple, [name] ([id number]) -------------------------------------------------------------------------------- Wound Assessment Details Patient Name: Morten, Jaicion P. Date of Service: 06/08/2015 3:00 PM Medical Record Number: [id number] Patient Account Number: [id number] Date of Birth/Sex: 03/06/1961 (54 y.o. Male) Treating RN: Clover Mealy, RN, BSN, [place] Sink Primary Care Physician: Hillery Aldo Other Clinician: Referring Physician: Fulton Mole Treating Physician/Extender: Rudene Re in Treatment: 0 Wound Status Wound Number: 3 Primary Diabetic Wound/Ulcer of the  Lower Etiology: Extremity Wound Location: Left Toe Great Wound Open Wounding Event: Thermal Burn Status: Date Acquired: 05/29/2015 Comorbid Arrhythmia, Hypertension, Type II Weeks Of Treatment: 0 History: Diabetes, Neuropathy Clustered Wound: No Photos Photo Uploaded By: Curtis Sites on 06/08/2015 16:51:25 Wound Measurements Length: (cm) 3.4 Width: (cm) 2.2 Depth: (cm) 0.1 Area: (cm) 5.875 Volume: (cm) 0.587 % Reduction in Area: % Reduction in Volume: Epithelialization: None Tunneling: No Undermining: No Wound Description Classification: Grade 1 Wound Margin: Distinct, outline attached Exudate Amount: Small Exudate Type: Serosanguineous Exudate Color: red, brown Foul Odor After Cleansing: No Wound Bed Granulation Amount: Medium (34-66%) Exposed Structure Granulation Quality: Pink, Pale Fascia Exposed: No Necrotic Amount: Medium (34-66%) Fat Layer Exposed: No Necrotic Quality: Adherent Slough Tendon Exposed: No Housewright, Wilberth P. ([id number]) Muscle Exposed: No Joint Exposed: No Bone Exposed: No Limited to Skin Breakdown Periwound Skin Texture Texture Color No Abnormalities Noted: No No Abnormalities Noted: No Callus: No Atrophie Blanche: No Crepitus: No Cyanosis: No Excoriation: No Ecchymosis: No Fluctuance: No Erythema: No Friable: No Hemosiderin Staining: No Induration: No Mottled: No Localized Edema: Yes Pallor: No Rash: No Rubor: No Scarring: No Temperature / Pain Moisture Temperature: No Abnormality No Abnormalities Noted: No Dry / Scaly: No Maceration: No Moist: Yes Wound Preparation Ulcer Cleansing: Rinsed/Irrigated with Saline Topical Anesthetic Applied: Other: lidocaine 4%, Treatment Notes Wound #3 (Left Toe Great) 1. Cleansed with: Clean wound with Normal Saline 4. Dressing Applied: Silvadene Cream 5. Secondary Dressing Applied Gauze and Kerlix/Conform 7. Secured with Secretary/administrator) Signed: 06/08/2015 5:19:40  PM By: Elpidio Eric BSN, RN Entered By: Elpidio Eric on 06/08/2015 15:36:28 Rooke, [name] ([id number]) -------------------------------------------------------------------------------- Wound Assessment Details Patient Name: Moser, Tynell P. Date of Service: 06/08/2015 3:00 PM Medical Record Number: [id number] Patient Account Number: [id number] Date of Birth/Sex: Aug 16, 1960 (54 y.o. Male) Treating RN: Clover Mealy, RN, BSN, [place] Sink Primary Care Physician: Hillery Aldo Other Clinician: Referring Physician: Fulton Mole Treating Physician/Extender: Rudene Re in Treatment: 0 Wound Status Wound Number: 4 Primary Diabetic  Wound/Ulcer of the Lower Etiology: Extremity Wound Location: Left Toe Second Wound Open Wounding Event: Thermal Burn Status: Date Acquired: 05/29/2015 Comorbid Arrhythmia, Hypertension, Type II Weeks Of Treatment: 0 History: Diabetes, Neuropathy Clustered Wound: No Photos Photo Uploaded By: Curtis Sitesorthy, Joanna on 06/08/2015 16:51:54 Wound Measurements Length: (cm) 1.2 Width: (cm) 1.3 Depth: (cm) 0.1 Area: (cm) 1.225 Volume: (cm) 0.123 % Reduction in Area: % Reduction in Volume: Epithelialization: None Tunneling: No Undermining: No Wound Description Classification: Grade 1 Wound Margin: Distinct, outline attached Exudate Amount: Medium Foul Odor After Cleansing: No Wound Bed Granulation Amount: None Present (0%) Exposed Structure Necrotic Amount: Large (67-100%) Fascia Exposed: No Necrotic Quality: Adherent Slough Fat Layer Exposed: No Tendon Exposed: No Muscle Exposed: No Joint Exposed: No Fichera, Nymir P. ([id number]) Bone Exposed: No Limited to Skin Breakdown Periwound Skin Texture Texture Color No Abnormalities Noted: No No Abnormalities Noted: No Callus: No Atrophie Blanche: No Crepitus: No Cyanosis: No Excoriation: No Ecchymosis: No Fluctuance: No Erythema: No Friable: No Hemosiderin Staining: No Induration: No Mottled: No Localized  Edema: No Pallor: No Rash: No Rubor: No Scarring: No Temperature / Pain Moisture Temperature: No Abnormality No Abnormalities Noted: No Dry / Scaly: No Maceration: No Moist: Yes Wound Preparation Ulcer Cleansing: Rinsed/Irrigated with Saline Topical Anesthetic Applied: Other: lidocaine 4%, Treatment Notes Wound #4 (Left Toe Second) 1. Cleansed with: Clean wound with Normal Saline 4. Dressing Applied: Silvadene Cream 5. Secondary Dressing Applied Gauze and Kerlix/Conform 7. Secured with Secretary/administratorTape Electronic Signature(s) Signed: 06/08/2015 5:19:40 PM By: Elpidio EricAfful, [name] BSN, RN Entered By: Elpidio EricAfful, [name] on 06/08/2015 15:38:02 Lajara, [name] EvertsJIM P. ([id number]) -------------------------------------------------------------------------------- Wound Assessment Details Patient Name: Norwood, Xabi P. Date of Service: 06/08/2015 3:00 PM Medical Record Number: [id number] Patient Account Number: [id number] Date of Birth/Sex: 1961-03-28 32(54 y.o. Male) Treating RN: Clover MealyAfful, RN, BSN, [place]inkita Primary Care Physician: Hillery AldoPATEL, SARAH Other Clinician: Referring Physician: Fulton MoleBurns, Harriett Treating Physician/Extender: Rudene ReBritto, Errol Weeks in Treatment: 0 Wound Status Wound Number: 5 Primary Diabetic Wound/Ulcer of the Lower Etiology: Extremity Wound Location: Left Toe Third Wound Open Wounding Event: Thermal Burn Status: Date Acquired: 05/29/2015 Comorbid Arrhythmia, Hypertension, Type II Weeks Of Treatment: 0 History: Diabetes, Neuropathy Clustered Wound: No Photos Photo Uploaded By: Curtis Sitesorthy, Joanna on 06/08/2015 16:51:55 Wound Measurements Length: (cm) 3 Width: (cm) 1.5 Depth: (cm) 0.1 Area: (cm) 3.534 Volume: (cm) 0.353 % Reduction in Area: % Reduction in Volume: Epithelialization: None Tunneling: No Undermining: No Wound Description Classification: Grade 1 Wound Margin: Distinct, outline attached Exudate Amount: Medium Exudate Type: Serosanguineous Exudate Color: red, brown Foul Odor After  Cleansing: No Wound Bed Granulation Amount: None Present (0%) Exposed Structure Necrotic Amount: Large (67-100%) Fascia Exposed: No Necrotic Quality: Adherent Slough Fat Layer Exposed: No Tendon Exposed: No Westergren, Sanav P. ([id number]) Muscle Exposed: No Joint Exposed: No Bone Exposed: No Limited to Skin Breakdown Periwound Skin Texture Texture Color No Abnormalities Noted: No No Abnormalities Noted: No Callus: No Atrophie Blanche: No Crepitus: No Cyanosis: No Excoriation: No Ecchymosis: No Fluctuance: No Erythema: No Friable: No Hemosiderin Staining: No Induration: No Mottled: No Localized Edema: No Pallor: No Rash: No Rubor: No Scarring: No Temperature / Pain Moisture Temperature: No Abnormality No Abnormalities Noted: No Dry / Scaly: No Maceration: No Moist: Yes Wound Preparation Ulcer Cleansing: Rinsed/Irrigated with Saline Topical Anesthetic Applied: Other: lidocaine 4%, Treatment Notes Wound #5 (Left Toe Third) 1. Cleansed with: Clean wound with Normal Saline 4. Dressing Applied: Silvadene Cream 5. Secondary Dressing Applied Gauze and Kerlix/Conform 7. Secured with Secretary/administratorTape Electronic Signature(s) Signed: 06/08/2015 5:19:40  PM By: Elpidio Eric BSN, RN Entered By: Elpidio Eric on 06/08/2015 15:39:14 Jalloh, [name] ([id number]) -------------------------------------------------------------------------------- Wound Assessment Details Patient Name: Nickles, Josiyah P. Date of Service: 06/08/2015 3:00 PM Medical Record Number: [id number] Patient Account Number: [id number] Date of Birth/Sex: 08-Apr-1961 (54 y.o. Male) Treating RN: Clover Mealy, RN, BSN, [place] Sink Primary Care Physician: Hillery Aldo Other Clinician: Referring Physician: Fulton Mole Treating Physician/Extender: Rudene Re in Treatment: 0 Wound Status Wound Number: 6 Primary Diabetic Wound/Ulcer of the Lower Etiology: Extremity Wound Location: Left Toe Fourth Wound Open Wounding Event: Thermal  Burn Status: Date Acquired: 05/29/2015 Comorbid Arrhythmia, Hypertension, Type II Weeks Of Treatment: 0 History: Diabetes, Neuropathy Clustered Wound: No Photos Photo Uploaded By: Curtis Sites on 06/08/2015 16:52:28 Wound Measurements Length: (cm) 2 Width: (cm) 1.1 Depth: (cm) 0.1 Area: (cm) 1.728 Volume: (cm) 0.173 % Reduction in Area: % Reduction in Volume: Epithelialization: None Tunneling: No Undermining: No Wound Description Classification: Grade 1 Wound Margin: Distinct, outline attached Exudate Amount: Small Exudate Type: Serosanguineous Exudate Color: red, brown Foul Odor After Cleansing: No Wound Bed Granulation Amount: None Present (0%) Exposed Structure Necrotic Amount: Large (67-100%) Fascia Exposed: No Necrotic Quality: Adherent Slough Fat Layer Exposed: No Tendon Exposed: No Mangine, Vedansh P. ([id number]) Muscle Exposed: No Joint Exposed: No Bone Exposed: No Limited to Skin Breakdown Periwound Skin Texture Texture Color No Abnormalities Noted: No No Abnormalities Noted: No Callus: No Atrophie Blanche: No Crepitus: No Cyanosis: No Excoriation: No Ecchymosis: No Fluctuance: No Erythema: No Friable: No Hemosiderin Staining: No Induration: No Mottled: No Localized Edema: Yes Pallor: No Rash: No Rubor: No Scarring: No Temperature / Pain Moisture Temperature: No Abnormality No Abnormalities Noted: No Dry / Scaly: No Maceration: No Moist: Yes Wound Preparation Ulcer Cleansing: Rinsed/Irrigated with Saline Topical Anesthetic Applied: Other: lidocaine 4%, Treatment Notes Wound #6 (Left Toe Fourth) 1. Cleansed with: Clean wound with Normal Saline 4. Dressing Applied: Silvadene Cream 5. Secondary Dressing Applied Gauze and Kerlix/Conform 7. Secured with Secretary/administrator) Signed: 06/08/2015 5:19:40 PM By: Elpidio Eric BSN, RN Entered By: Elpidio Eric on 06/08/2015 15:40:30 Roehrs, [name]  ([id number]) -------------------------------------------------------------------------------- Wound Assessment Details Patient Name: Sheffer, Rien P. Date of Service: 06/08/2015 3:00 PM Medical Record Number: [id number] Patient Account Number: [id number] Date of Birth/Sex: 19-Sep-1960 (54 y.o. Male) Treating RN: Clover Mealy, RN, BSN, [name] Sink Primary Care Physician: Hillery Aldo Other Clinician: Referring Physician: Fulton Mole Treating Physician/Extender: Rudene Re in Treatment: 0 Wound Status Wound Number: 7 Primary Diabetic Wound/Ulcer of the Lower Etiology: Extremity Wound Location: Right Toe Second Wound Open Wounding Event: Thermal Burn Status: Date Acquired: 05/29/2015 Comorbid Arrhythmia, Hypertension, Type II Weeks Of Treatment: 0 History: Diabetes, Neuropathy Clustered Wound: No Photos Photo Uploaded By: Curtis Sites on 06/08/2015 16:52:30 Wound Measurements Length: (cm) 2 Width: (cm) 0.4 Depth: (cm) 0.1 Area: (cm) 0.628 Volume: (cm) 0.063 % Reduction in Area: % Reduction in Volume: Epithelialization: None Tunneling: No Undermining: No Wound Description Classification: Grade 1 Wound Margin: Distinct, outline attached Exudate Amount: None Present Foul Odor After Cleansing: No Wound Bed Granulation Amount: None Present (0%) Exposed Structure Necrotic Amount: Large (67-100%) Fascia Exposed: No Necrotic Quality: Eschar Fat Layer Exposed: No Tendon Exposed: No Muscle Exposed: No Joint Exposed: No Nieland, Chaitanya P. ([id number]) Bone Exposed: No Limited to Skin Breakdown Periwound Skin Texture Texture Color No Abnormalities Noted: No No Abnormalities Noted: No Callus: No Atrophie Blanche: No Crepitus: No Cyanosis: No Excoriation: No Ecchymosis: No Fluctuance: No Erythema: No Friable: No Hemosiderin Staining: No Induration: No Mottled: No  Localized Edema: No Pallor: No Rash: No Rubor: No Scarring: No Temperature / Pain Moisture Temperature:  No Abnormality No Abnormalities Noted: No Dry / Scaly: Yes Maceration: No Moist: No Wound Preparation Ulcer Cleansing: Rinsed/Irrigated with Saline Topical Anesthetic Applied: Other: LIDOCAINE 4%, Treatment Notes Wound #7 (Right Toe Second) 1. Cleansed with: Clean wound with Normal Saline 4. Dressing Applied: Silvadene Cream 5. Secondary Dressing Applied Gauze and Kerlix/Conform 7. Secured with Secretary/administrator) Signed: 06/08/2015 5:19:40 PM By: Elpidio Eric BSN, RN Entered By: Elpidio Eric on 06/08/2015 15:42:36 Pollet, [name] ([id number]) -------------------------------------------------------------------------------- Wound Assessment Details Patient Name: Fouse, Markcus P. Date of Service: 06/08/2015 3:00 PM Medical Record Number: [id number] Patient Account Number: [id number] Date of Birth/Sex: January 30, 1961 (54 y.o. Male) Treating RN: Clover Mealy, RN, BSN, [place] Sink Primary Care Physician: Hillery Aldo Other Clinician: Referring Physician: Fulton Mole Treating Physician/Extender: Rudene Re in Treatment: 0 Wound Status Wound Number: 8 Primary Diabetic Wound/Ulcer of the Lower Etiology: Extremity Wound Location: Right Toe Third Wound Open Wounding Event: Thermal Burn Status: Date Acquired: 05/29/2015 Comorbid Arrhythmia, Hypertension, Type II Weeks Of Treatment: 0 History: Diabetes, Neuropathy Clustered Wound: No Photos Photo Uploaded By: Curtis Sites on 06/08/2015 16:53:01 Wound Measurements Length: (cm) 1.1 Width: (cm) 0.3 Depth: (cm) 0.1 Area: (cm) 0.259 Volume: (cm) 0.026 % Reduction in Area: % Reduction in Volume: Epithelialization: None Tunneling: No Undermining: No Wound Description Classification: Grade 1 Wound Margin: Distinct, outline attached Exudate Amount: None Present Foul Odor After Cleansing: No Wound Bed Granulation Amount: None Present (0%) Exposed Structure Necrotic Amount: Large (67-100%) Fascia Exposed: No Necrotic  Quality: Eschar Fat Layer Exposed: No Tendon Exposed: No Muscle Exposed: No Joint Exposed: No Harbaugh, Shonte P. ([id number]) Bone Exposed: No Limited to Skin Breakdown Periwound Skin Texture Texture Color No Abnormalities Noted: No No Abnormalities Noted: No Callus: No Atrophie Blanche: No Crepitus: No Cyanosis: No Excoriation: No Ecchymosis: No Fluctuance: No Erythema: No Friable: No Hemosiderin Staining: No Induration: No Mottled: No Localized Edema: No Pallor: No Rash: No Rubor: No Scarring: No Temperature / Pain Moisture Temperature: No Abnormality No Abnormalities Noted: No Dry / Scaly: Yes Maceration: No Moist: No Wound Preparation Ulcer Cleansing: Rinsed/Irrigated with Saline Topical Anesthetic Applied: Other: LIDOCAINE 4%, Treatment Notes Wound #8 (Right Toe Third) 1. Cleansed with: Clean wound with Normal Saline 4. Dressing Applied: Silvadene Cream 5. Secondary Dressing Applied Gauze and Kerlix/Conform 7. Secured with Secretary/administrator) Signed: 06/08/2015 5:19:40 PM By: Elpidio Eric BSN, RN Entered By: Elpidio Eric on 06/08/2015 15:43:45 Charpentier, [name] ([id number]) -------------------------------------------------------------------------------- Wound Assessment Details Patient Name: Treto, Ankush P. Date of Service: 06/08/2015 3:00 PM Medical Record Number: [id number] Patient Account Number: [id number] Date of Birth/Sex: Dec 14, 1960 (54 y.o. Male) Treating RN: Clover Mealy, RN, BSN, [place] Sink Primary Care Physician: Hillery Aldo Other Clinician: Referring Physician: Fulton Mole Treating Physician/Extender: Rudene Re in Treatment: 0 Wound Status Wound Number: 9 Primary Diabetic Wound/Ulcer of the Lower Etiology: Extremity Wound Location: Right Toe Fourth Wound Open Wounding Event: Thermal Burn Status: Date Acquired: 05/28/2015 Comorbid Arrhythmia, Hypertension, Type II Weeks Of Treatment: 0 History: Diabetes, Neuropathy Clustered Wound:  No Photos Photo Uploaded By: Curtis Sites on 06/08/2015 16:53:02 Wound Measurements Length: (cm) 1.3 Width: (cm) 0.4 Depth: (cm) 0.1 Area: (cm) 0.408 Volume: (cm) 0.041 % Reduction in Area: % Reduction in Volume: Epithelialization: None Tunneling: No Undermining: No Wound Description Classification: Grade 1 Wound Margin: Distinct, outline attached Exudate Amount: None Present Foul Odor After Cleansing: No Wound Bed Granulation Amount: None Present (0%) Exposed Structure  Necrotic Amount: Large (67-100%) Fascia Exposed: No Necrotic Quality: Eschar Fat Layer Exposed: No Tendon Exposed: No Muscle Exposed: No Joint Exposed: No Persad, Diangelo P. ([id number]) Bone Exposed: No Limited to Skin Breakdown Periwound Skin Texture Texture Color No Abnormalities Noted: No No Abnormalities Noted: No Callus: No Atrophie Blanche: No Crepitus: No Cyanosis: No Excoriation: No Ecchymosis: No Fluctuance: No Erythema: No Friable: No Hemosiderin Staining: No Induration: No Mottled: No Localized Edema: No Pallor: No Rash: No Rubor: No Scarring: No Temperature / Pain Moisture Temperature: No Abnormality No Abnormalities Noted: No Dry / Scaly: Yes Maceration: No Moist: No Wound Preparation Ulcer Cleansing: Rinsed/Irrigated with Saline Topical Anesthetic Applied: Other: lidocaine 4%, Treatment Notes Wound #9 (Right Toe Fourth) 1. Cleansed with: Clean wound with Normal Saline 4. Dressing Applied: Silvadene Cream 5. Secondary Dressing Applied Gauze and Kerlix/Conform 7. Secured with Secretary/administrator) Signed: 06/08/2015 5:19:40 PM By: Elpidio Eric BSN, RN Entered By: Elpidio Eric on 06/08/2015 15:45:02 Hamberger, [name] ([id number]) -------------------------------------------------------------------------------- Vitals Details Patient Name: Forbess, Isaish P. Date of Service: 06/08/2015 3:00 PM Medical Record Number: [id number] Patient Account Number:  [id number] Date of Birth/Sex: [birth year] (54 y.o. Male) Treating RN: Clover Mealy, RN, BSN, [name] Primary Care Physician: Hillery Aldo Other Clinician: Referring Physician: Fulton Mole Treating Physician/Extender: Rudene Re in Treatment: 0 Vital Signs Time Taken: 15:19 Temperature (F): 98.1 Height (in): 75 Pulse (bpm): 62 Source: Stated Respiratory Rate (breaths/min): 20 Weight (lbs): 321 Blood Pressure (mmHg): 126/66 Source: Measured Reference Range: 80 - 120 mg / dl Body Mass Index (BMI): 40.1 Electronic Signature(s) Signed: 06/08/2015 5:19:40 PM By: Elpidio Eric BSN, RN Entered By: Elpidio Eric on 06/08/2015 15:19:50

## 2015-06-09 NOTE — Progress Notes (Addendum)
[name], [name] ([id number]) Visit Report for 06/08/2015 Chief Complaint Document Details Patient Name: [name], [name]. Date of Service: 06/08/2015 3:00 PM Medical Record Number: [id number] Patient Account Number: [id number] Date of Birth/Sex: 1961/03/21 (54 y.o. Male) Treating RN: Clover Mealy, RN, BSN, [place] Sink Primary Care Physician: Hillery Aldo Other Clinician: Referring Physician: Fulton Mole Treating Physician/Extender: Rudene Re in Treatment: 0 Information Obtained from: Patient Chief Complaint Patient presents to the wound care center with burn wound(s)left lower extremity ankle and foot for one week Electronic Signature(s) Signed: 06/08/2015 4:11:50 PM By: Evlyn Kanner MD, FACS Entered By: Evlyn Kanner on 06/08/2015 16:11:49 Mungin, [name] ([id number]) -------------------------------------------------------------------------------- HPI Details Patient Name: [name], [name] Date of Service: 06/08/2015 3:00 PM Medical Record Number: [id number] Patient Account Number: [id number] Date of Birth/Sex: Jan 26, 1961 (54 y.o. Male) Treating RN: Clover Mealy, RN, BSN, [place] Sink Primary Care Physician: Hillery Aldo Other Clinician: Referring Physician: Fulton Mole Treating Physician/Extender: Rudene Re in Treatment: 0 History of Present Illness Location: left lower extremity ankle and foot Quality: Patient reports No Pain. Severity: Patient states wound are getting worse. Duration: Patient has had the wound for < 1 weeks prior to presenting for treatment Context: The wound occurred when the patient was having a pedicure and due to his neuropathy didn't feel the temperature of the hot water Modifying Factors: Other treatment(s) tried include:on Bactrim and Silvadene Associated Signs and Symptoms: Patient reports having increase swelling office left lower extremity.Marland Kitchen HPI Description: 54 year old gentleman who was referred to as for a burn of the left foot which she has had for about  a week. He apparently was getting a pedicure at a salon and they used water which was hot and the patient did not feel it because of his diabetic neuropathy. After he got home he developed blisters on the left foot which then opened up and continued to lose fluid. Past medical history is significant for diabetes mellitus, alcohol abuse, neuropathy, depression, COPD, CHF, hypertension, tracheal stenosis, status post cardiac stent placement, history of PE. He has never been a smoker and as noted has been an alcoholic in the past but given up alcohol for the last 3 years. He was put on Bactrim, Silvadene and asked to go to either a burn center wound center. of note he has not had a significant discrepancy in the size of his left lower extremity and right lower extremity in the past. Electronic Signature(s) Signed: 06/08/2015 4:13:33 PM By: Evlyn Kanner MD, FACS Previous Signature: 06/08/2015 3:42:31 PM Version By: Evlyn Kanner MD, FACS Entered By: Evlyn Kanner on 06/08/2015 16:13:33 [name], [name] ([id number]) -------------------------------------------------------------------------------- Physical Exam Details Patient Name: [name], [name] Date of Service: 06/08/2015 3:00 PM Medical Record Number: [id number] Patient Account Number: [id number] Date of Birth/Sex: 01-16-1961 (54 y.o. Male) Treating RN: Clover Mealy, RN, BSN, [place] Sink Primary Care Physician: Hillery Aldo Other Clinician: Referring Physician: Fulton Mole Treating Physician/Extender: Rudene Re in Treatment: 0 Eyes Nonicteric. Reactive to light. Ears, Nose, Mouth, and Throat Lips, teeth, and gums WNL.Marland Kitchen Moist mucosa without lesions . Neck supple and nontender. No palpable supraclavicular or cervical adenopathy. Normal sized without goiter. Respiratory he has a tracheostomy in place. Cardiovascular Pedal Pulses WNL. ABIs noncompressible. his left calf is 7 cm larger than the right and his left ankle is 9 cm larger than the  right. Chest Breasts symmetical and no nipple discharge.. Breast tissue WNL, no masses, lumps, or tenderness.. Gastrointestinal (GI) Abdomen without masses or tenderness.. No liver or spleen enlargement or tenderness.. Lymphatic No adneopathy.  No adenopathy. No adenopathy. Musculoskeletal Adexa without tenderness or enlargement.. Digits and nails w/o clubbing, cyanosis, infection, petechiae, ischemia, or inflammatory conditions.. Integumentary (Hair, Skin) No suspicious lesions. No crepitus or fluctuance. No peri-wound warmth or erythema. No masses.Marland Kitchen Psychiatric Judgement and insight Intact.. No evidence of depression, anxiety, or agitation.. Notes He has second-degree burns of the toes on his left foot and ankle region and the lower distal lower extremity. Most significant is the massive increase in size of his calf and ankle as compared to the right. Homans sign is negative. Electronic Signature(s) Signed: 06/08/2015 4:15:14 PM By: Evlyn Kanner MD, FACS Entered By: Evlyn Kanner on 06/08/2015 16:15:13 [name], [name] ([id number]) -------------------------------------------------------------------------------- Physician Orders Details Patient Name: [name], [name] Date of Service: 06/08/2015 3:00 PM Medical Record Number: [id number] Patient Account Number: [id number] Date of Birth/Sex: [birth year] (54 y.o. Male) Treating RN: Clover Mealy, RN, BSN, [place] Sink Primary Care Physician: Hillery Aldo Other Clinician: Referring Physician: Fulton Mole Treating Physician/Extender: Rudene Re in Treatment: 0 Verbal / Phone Orders: Yes Clinician: Afful, RN, BSN, [name] Read Back and Verified: Yes Diagnosis Coding Wound Cleansing Wound #1 Left,Proximal Lower Leg o Cleanse wound with mild soap and water o May Shower, gently pat wound dry prior to applying new dressing. o May shower with protection. Wound #10 Left,Lateral Lower Leg o Cleanse wound with mild soap and water o May  Shower, gently pat wound dry prior to applying new dressing. o May shower with protection. Wound #2 Left,Distal Lower Leg o Cleanse wound with mild soap and water o May Shower, gently pat wound dry prior to applying new dressing. o May shower with protection. Wound #3 Left Toe Great o Cleanse wound with mild soap and water o May Shower, gently pat wound dry prior to applying new dressing. o May shower with protection. Wound #4 Left Toe Second o Cleanse wound with mild soap and water o May Shower, gently pat wound dry prior to applying new dressing. o May shower with protection. Wound #5 Left Toe Third o Cleanse wound with mild soap and water o May Shower, gently pat wound dry prior to applying new dressing. o May shower with protection. Wound #6 Left Toe Fourth o Cleanse wound with mild soap and water o May Shower, gently pat wound dry prior to applying new dressing. o May shower with protection. Wound #7 Right Toe Second o Cleanse wound with mild soap and water [place], Danyael P. ([id number]) o May Shower, gently pat wound dry prior to applying new dressing. o May shower with protection. Wound #8 Right Toe Third o Cleanse wound with mild soap and water o May Shower, gently pat wound dry prior to applying new dressing. o May shower with protection. Wound #9 Right Toe Fourth o Cleanse wound with mild soap and water o May Shower, gently pat wound dry prior to applying new dressing. o May shower with protection. Skin Barriers/Peri-Wound Care Wound #1 Left,Proximal Lower Leg o Moisturizing lotion Wound #10 Left,Lateral Lower Leg o Moisturizing lotion Wound #2 Left,Distal Lower Leg o Moisturizing lotion Wound #3 Left Toe Great o Moisturizing lotion Wound #4 Left Toe Second o Moisturizing lotion Wound #5 Left Toe Third o Moisturizing lotion Wound #6 Left Toe Fourth o Moisturizing lotion Wound #7 Right Toe  Second o Moisturizing lotion Wound #8 Right Toe Third o Moisturizing lotion Wound #9 Right Toe Fourth o Moisturizing lotion Primary Wound Dressing Wound #1 Left,Proximal Lower Leg o Silvadene Cream Wound #10 Left,Lateral Lower Leg [name], [name] ([id number]) o  Silvadene Cream Wound #2 Left,Distal Lower Leg o Silvadene Cream Wound #3 Left Toe Great o Silvadene Cream Wound #4 Left Toe Second o Silvadene Cream Wound #5 Left Toe Third o Silvadene Cream Wound #6 Left Toe Fourth o Silvadene Cream Wound #7 Right Toe Second o Silvadene Cream Wound #8 Right Toe Third o Silvadene Cream Wound #9 Right Toe Fourth o Silvadene Cream Secondary Dressing Wound #1 Left,Proximal Lower Leg o Gauze and Kerlix/Conform Wound #10 Left,Lateral Lower Leg o Gauze and Kerlix/Conform Wound #2 Left,Distal Lower Leg o Gauze and Kerlix/Conform Wound #3 Left Toe Great o Gauze and Kerlix/Conform Wound #4 Left Toe Second o Gauze and Kerlix/Conform Wound #5 Left Toe Third o Gauze and Kerlix/Conform Wound #6 Left Toe Fourth o Gauze and Kerlix/Conform Wound #7 Right Toe Second [name], [name] ([id number]) o Gauze and Kerlix/Conform Wound #8 Right Toe Third o Gauze and Kerlix/Conform Wound #9 Right Toe Fourth o Gauze and Kerlix/Conform Dressing Change Frequency Wound #1 Left,Proximal Lower Leg o Change dressing every day. Wound #10 Left,Lateral Lower Leg o Change dressing every day. Wound #2 Left,Distal Lower Leg o Change dressing every day. Wound #3 Left Toe Great o Change dressing every day. Wound #4 Left Toe Second o Change dressing every day. Wound #5 Left Toe Third o Change dressing every day. Wound #6 Left Toe Fourth o Change dressing every day. Wound #7 Right Toe Second o Change dressing every day. Wound #8 Right Toe Third o Change dressing every day. Wound #9 Right Toe Fourth o Change dressing every day. Follow-up  Appointments Wound #1 Left,Proximal Lower Leg o Return Appointment in 1 week. Wound #10 Left,Lateral Lower Leg o Return Appointment in 1 week. Wound #2 Left,Distal Lower Leg o Return Appointment in 1 week. [name], [name] ([id number]) Wound #3 Left Toe Great o Return Appointment in 1 week. Wound #4 Left Toe Second o Return Appointment in 1 week. Wound #5 Left Toe Third o Return Appointment in 1 week. Wound #6 Left Toe Fourth o Return Appointment in 1 week. Wound #7 Right Toe Second o Return Appointment in 1 week. Wound #8 Right Toe Third o Return Appointment in 1 week. Wound #9 Right Toe Fourth o Return Appointment in 1 week. Additional Orders / Instructions Wound #1 Left,Proximal Lower Leg o Other: - Patient to go to hospital ASAP for a scan to rule out DVT. Per MD instructions, if scan is positive, he is supposed to seek medical attention in the ER. Wound #10 Left,Lateral Lower Leg o Other: - Patient to go to hospital ASAP for a scan to rule out DVT. Per MD instructions, if scan is positive, he is supposed to seek medical attention in the ER. Wound #2 Left,Distal Lower Leg o Other: - Patient to go to hospital ASAP for a scan to rule out DVT. Per MD instructions, if scan is positive, he is supposed to seek medical attention in the ER. Wound #3 Left Toe Great o Other: - Patient to go to hospital ASAP for a scan to rule out DVT. Per MD instructions, if scan is positive, he is supposed to seek medical attention in the ER. Wound #4 Left Toe Second o Other: - Patient to go to hospital ASAP for a scan to rule out DVT. Per MD instructions, if scan is positive, he is supposed to seek medical attention in the ER. Wound #5 Left Toe Third o Other: - Patient to go to hospital ASAP for a scan to rule out DVT. Per MD instructions,  if scan is positive, he is supposed to seek medical attention in the ER. Wound #6 Left Toe Fourth [name], Elijah P. ([id number]) o  Other: - Patient to go to hospital ASAP for a scan to rule out DVT. Per MD instructions, if scan is positive, he is supposed to seek medical attention in the ER. Wound #7 Right Toe Second o Other: - Patient to go to hospital ASAP for a scan to rule out DVT. Per MD instructions, if scan is positive, he is supposed to seek medical attention in the ER. Wound #8 Right Toe Third o Other: - Patient to go to hospital ASAP for a scan to rule out DVT. Per MD instructions, if scan is positive, he is supposed to seek medical attention in the ER. Wound #9 Right Toe Fourth o Other: - Patient to go to hospital ASAP for a scan to rule out DVT. Per MD instructions, if scan is positive, he is supposed to seek medical attention in the ER. Electronic Signature(s) Signed: 06/08/2015 4:24:56 PM By: Evlyn Kanner MD, FACS Signed: 06/08/2015 5:19:40 PM By: Elpidio Eric BSN, RN Entered By: Elpidio Eric on 06/08/2015 16:05:51 [name], [name] ([id number]) -------------------------------------------------------------------------------- Problem List Details Patient Name: Masoud, Jasmond P. Date of Service: 06/08/2015 3:00 PM Medical Record Number: [id number] Patient Account Number: [id number] Date of Birth/Sex: May 24, 1961 (54 y.o. Male) Treating RN: Clover Mealy, RN, BSN, [place] Sink Primary Care Physician: Hillery Aldo Other Clinician: Referring Physician: Fulton Mole Treating Physician/Extender: Rudene Re in Treatment: 0 Active Problems ICD-10 Encounter Code Description Active Date Diagnosis E11.622 Type 2 diabetes mellitus with other skin ulcer 06/08/2015 Yes T25.222A Burn of second degree of left foot, initial encounter 06/08/2015 Yes T25.292A Burn of second degree of multiple sites of left ankle and 06/08/2015 Yes foot, initial encounter I82.4Y2 Acute embolism and thrombosis of unspecified deep veins 06/08/2015 Yes of left proximal lower extremity Inactive Problems Resolved Problems Electronic  Signature(s) Signed: 06/08/2015 4:11:25 PM By: Evlyn Kanner MD, FACS Previous Signature: 06/08/2015 4:11:18 PM Version By: Evlyn Kanner MD, FACS Entered By: Evlyn Kanner on 06/08/2015 16:11:25 [name], [name] ([id number]) -------------------------------------------------------------------------------- Progress Note Details Patient Name: Buske, Garner P. Date of Service: 06/08/2015 3:00 PM Medical Record Number: [id number] Patient Account Number: [id number] Date of Birth/Sex: 29-Jan-1961 (54 y.o. Male) Treating RN: Clover Mealy, RN, BSN, [place] Sink Primary Care Physician: Hillery Aldo Other Clinician: Referring Physician: Fulton Mole Treating Physician/Extender: Rudene Re in Treatment: 0 Subjective Chief Complaint Information obtained from Patient Patient presents to the wound care center with burn wound(s)left lower extremity ankle and foot for one week History of Present Illness (HPI) The following HPI elements were documented for the patient's wound: Location: left lower extremity ankle and foot Quality: Patient reports No Pain. Severity: Patient states wound are getting worse. Duration: Patient has had the wound for < 1 weeks prior to presenting for treatment Context: The wound occurred when the patient was having a pedicure and due to his neuropathy didn't feel the temperature of the hot water Modifying Factors: Other treatment(s) tried include:on Bactrim and Silvadene Associated Signs and Symptoms: Patient reports having increase swelling office left lower extremity.. 54 year old gentleman who was referred to as for a burn of the left foot which she has had for about a week. He apparently was getting a pedicure at a salon and they used water which was hot and the patient did not feel it because of his diabetic neuropathy. After he got home he developed blisters on the left foot which then opened up  and continued to lose fluid. Past medical history is significant for diabetes  mellitus, alcohol abuse, neuropathy, depression, COPD, CHF, hypertension, tracheal stenosis, status post cardiac stent placement, history of PE. He has never been a smoker and as noted has been an alcoholic in the past but given up alcohol for the last 3 years. He was put on Bactrim, Silvadene and asked to go to either a burn center wound center. of note he has not had a significant discrepancy in the size of his left lower extremity and right lower extremity in the past. Wound History Patient presents with 9 open wounds that have been present for approximately 1 week. Patient has been treating wounds in the following manner: gauze, zinc oxide. Laboratory tests have not been performed in the last month. Patient reportedly has not tested positive for an antibiotic resistant organism. Patient reportedly has not tested positive for osteomyelitis. Patient reportedly has not had testing performed to evaluate circulation in the legs. Patient experiences the following problems associated with their wounds: infection, swelling. Patient History GOPAL, MALTER ([id number]) Information obtained from Patient, Chart. Allergies PCN (Severity: Severe), cephalexin (Severity: Severe), hydrochlorothiazide (Severity: Moderate) Family History Cancer - Siblings, Heart Disease - Siblings, Father, Mother, Paternal Grandparents, Maternal Grandparents, Hypertension - Child, Siblings, Father, Paternal Grandparents, Maternal Grandparents, Tuberculosis - Maternal Grandparents, No family history of Diabetes, Hereditary Spherocytosis, Kidney Disease, Lung Disease, Seizures, Stroke, Thyroid Problems. Social History Never smoker, Marital Status - Divorced, Alcohol Use - Never, Drug Use - No History, Caffeine Use - Daily - coffee. Medical History Eyes Denies history of Cataracts, Glaucoma, Optic Neuritis Ear/Nose/Mouth/Throat Denies history of Chronic sinus problems/congestion, Middle ear  problems Hematologic/Lymphatic Denies history of Anemia, Hemophilia, Human Immunodeficiency Virus, Lymphedema, Sickle Cell Disease Respiratory Denies history of Aspiration, Asthma, Chronic Obstructive Pulmonary Disease (COPD), Pneumothorax, Sleep Apnea, Tuberculosis Cardiovascular Patient has history of Arrhythmia, Hypertension Denies history of Hypotension, Myocardial Infarction, Peripheral Arterial Disease, Peripheral Venous Disease, Phlebitis Gastrointestinal Denies history of Cirrhosis , Colitis, Crohn s, Hepatitis A, Hepatitis B, Hepatitis C Endocrine Patient has history of Type II Diabetes Genitourinary Denies history of End Stage Renal Disease Immunological Denies history of Lupus Erythematosus, Raynaud s, Scleroderma Musculoskeletal Denies history of Gout, Rheumatoid Arthritis, Osteoarthritis, Osteomyelitis Neurologic Patient has history of Neuropathy Denies history of Quadriplegia, Paraplegia, Seizure Disorder Oncologic Denies history of Received Chemotherapy, Received Radiation Psychiatric Denies history of Anorexia/bulimia, Confinement Anxiety Patient is treated with Insulin. Blood sugar is not tested. Blood sugar results noted at the following times: Lunch - 271. Drewes, Jeven P. ([id number]) Review of Systems (ROS) Constitutional Symptoms (General Health) The patient has no complaints or symptoms. Eyes Complains or has symptoms of Glasses / Contacts. Ear/Nose/Mouth/Throat The patient has no complaints or symptoms. Hematologic/Lymphatic The patient has no complaints or symptoms. Respiratory The patient has no complaints or symptoms, Tracheostomy secondary to ARF Cardiovascular The patient has no complaints or symptoms. Gastrointestinal The patient has no complaints or symptoms. Endocrine The patient has no complaints or symptoms. Genitourinary The patient has no complaints or symptoms. Immunological The patient has no complaints or symptoms. Integumentary  (Skin) Complains or has symptoms of Wounds. Musculoskeletal The patient has no complaints or symptoms. Neurologic Complains or has symptoms of Numbness/parasthesias. Oncologic The patient has no complaints or symptoms. Psychiatric The patient has no complaints or symptoms. Objective Constitutional Vitals Time Taken: 3:19 PM, Height: 75 in, Source: Stated, Weight: 321 lbs, Source: Measured, BMI: 40.1, Temperature: 98.1 F, Pulse: 62 bpm, Respiratory Rate: 20 breaths/min, Blood Pressure: 126/66  mmHg. Eyes Nonicteric. Reactive to light. Ears, Nose, Mouth, and Throat Lips, teeth, and gums WNL.Marland Kitchen Moist mucosa without lesions . W[place]t, Arrin P. ([id number]) Neck supple and nontender. No palpable supraclavicular or cervical adenopathy. Normal sized without goiter. Respiratory he has a tracheostomy in place. Cardiovascular Pedal Pulses WNL. ABIs noncompressible. his left calf is 7 cm larger than the right and his left ankle is 9 cm larger than the right. Chest Breasts symmetical and no nipple discharge.. Breast tissue WNL, no masses, lumps, or tenderness.. Gastrointestinal (GI) Abdomen without masses or tenderness.. No liver or spleen enlargement or tenderness.. Lymphatic No adneopathy. No adenopathy. No adenopathy. Musculoskeletal Adexa without tenderness or enlargement.. Digits and nails w/o clubbing, cyanosis, infection, petechiae, ischemia, or inflammatory conditions.Marland Kitchen Psychiatric Judgement and insight Intact.. No evidence of depression, anxiety, or agitation.. General Notes: He has second-degree burns of the toes on his left foot and ankle region and the lower distal lower extremity. Most significant is the massive increase in size of his calf and ankle as compared to the right. Homans sign is negative. Integumentary (Hair, Skin) No suspicious lesions. No crepitus or fluctuance. No peri-wound warmth or erythema. No masses.. Wound #1 status is Open. Original cause of wound was  Thermal Burn. The wound is located on the Left,Proximal Lower Leg. The wound measures 2.4cm length x 3.6cm width x 0.1cm depth; 6.786cm^2 area and 0.679cm^3 volume. The wound is limited to skin breakdown. There is no tunneling or undermining noted. There is a medium amount of serosanguineous drainage noted. The wound margin is distinct with the outline attached to the wound base. There is medium (34-66%) pink, pale granulation within the wound bed. There is a medium (34-66%) amount of necrotic tissue within the wound bed including Adherent Slough. The periwound skin appearance exhibited: Localized Edema, Moist, Erythema. The periwound skin appearance did not exhibit: Callus, Crepitus, Excoriation, Fluctuance, Friable, Induration, Rash, Scarring, Dry/Scaly, Maceration, Atrophie Blanche, Cyanosis, Ecchymosis, Hemosiderin Staining, Mottled, Pallor, Rubor. The surrounding wound skin color is noted with erythema which is circumferential. Erythema is marked. Periwound temperature was noted as No Abnormality. Wound #10 status is Open. Original cause of wound was Thermal Burn. The wound is located on the Left,Lateral Lower Leg. The wound measures 5cm length x 1.5cm width x 0.1cm depth; 5.89cm^2 area and 0.589cm^3 volume. The wound is limited to skin breakdown. There is no tunneling or undermining noted. There is a medium amount of serosanguineous drainage noted. The wound margin is distinct with the Parisien, Plummer P. ([id number]) outline attached to the wound base. There is small (1-33%) pink, pale granulation within the wound bed. There is a medium (34-66%) amount of necrotic tissue within the wound bed including Adherent Slough. The periwound skin appearance exhibited: Moist. The periwound skin appearance did not exhibit: Callus, Crepitus, Excoriation, Fluctuance, Friable, Induration, Localized Edema, Rash, Scarring, Dry/Scaly, Maceration, Atrophie Blanche, Cyanosis, Ecchymosis, Hemosiderin Staining,  Mottled, Pallor, Rubor, Erythema. Periwound temperature was noted as No Abnormality. Wound #2 status is Open. Original cause of wound was Thermal Burn. The wound is located on the Left,Distal Lower Leg. The wound measures 3.4cm length x 12.2cm width x 0.2cm depth; 32.578cm^2 area and 6.516cm^3 volume. The wound is limited to skin breakdown. There is no tunneling or undermining noted. There is a medium amount of serosanguineous drainage noted. The wound margin is distinct with the outline attached to the wound base. There is large (67-100%) red, pink granulation within the wound bed. There is no necrotic tissue within the wound bed. The periwound  skin appearance exhibited: Moist. The periwound skin appearance did not exhibit: Callus, Crepitus, Excoriation,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

## 2015-06-09 NOTE — Progress Notes (Signed)
[name], [name] ([id number]) Visit Report for 06/08/2015 Abuse/Suicide Risk Screen Details Patient Name: [name], [name] EvertsJIM P. Date of Service: 06/08/2015 3:00 PM Medical Record Number: [id number] Patient Account Number: [id number] Date of Birth/Sex: 30-Jan-1961 [age] y.o. Male) Treating RN: [name], [place] [name] Primary Care Physician: [name] Other Clinician: Referring Physician: Fulton MoleBurns, [name] Treating Physician/Extender: [name] in Treatment: 0 Abuse/Suicide Risk Screen Items Answer ABUSE/SUICIDE RISK SCREEN: Has anyone close to you tried to hurt or harm you recentlyo No Do you feel uncomfortable with anyone in your familyo No Has anyone forced you do things that you didnot want to doo No Do you have any thoughts of harming yourselfo No Patient displays signs or symptoms of abuse and/or neglect. No Electronic Signature(s) Signed: 06/08/2015 5:19:40 PM By: [name] BSN, RN Entered By: [name] on 06/08/2015 15:21:23 [name], [name] EvertsJIM P. ([id number]) -------------------------------------------------------------------------------- Activities of Daily Living Details Patient Name: [name], [name] Date of Service: 06/08/2015 3:00 PM Medical Record Number: [id number] Patient Account Number: [id number] Date of Birth/Sex: 30-Jan-1961 [age] y.o. Male) Treating RN: [name], [place] [name] Primary Care Physician: [name] Other Clinician: Referring Physician: Fulton MoleBurns, [name] Treating Physician/Extender: [name] in Treatment: 0 Activities of Daily Living Items Answer Activities of Daily Living (Please select one for each item) Drive Automobile Completely Able Take Medications Completely Able Use Telephone Completely Able Care for Appearance Completely Able Use Toilet Completely Able Bath / Shower Completely Able Dress Self Completely Able Feed Self Completely Able Walk Completely Able Get In / Out Bed Completely Able Housework Completely Able Prepare Meals Completely  Able Handle Money Completely Able Shop for Self Completely Able Electronic Signature(s) Signed: 06/08/2015 5:19:40 PM By: [name] BSN, RN Entered By: [name] on 06/08/2015 15:22:00 [name], [name] EvertsJIM P. ([id number]) -------------------------------------------------------------------------------- Education Assessment Details Patient Name: [name], [name] Date of Service: 06/08/2015 3:00 PM Medical Record Number: [id number] Patient Account Number: [id number] Date of Birth/Sex: 30-Jan-1961 [age] y.o. Male) Treating RN: [name], [name] Primary Care Physician: [name] Other Clinician: Referring Physician: Fulton MoleBurns, [name] Treating Physician/Extender: [name] in Treatment: 0 Primary Learner Assessed: Patient Learning Preferences/Education Level/Primary Language Learning Preference: Explanation Highest Education Level: College or Above Preferred Language: English Cognitive Barrier Assessment/Beliefs Language Barrier: No Physical Barrier Assessment Impaired Vision: Yes Glasses Impaired Hearing: No Decreased Hand dexterity: No Knowledge/Comprehension Assessment Knowledge Level: High Comprehension Level: High Ability to understand written High instructions: Ability to understand verbal High instructions: Motivation Assessment Anxiety Level: Calm Cooperation: Cooperative Education Importance: Acknowledges Need Interest in Health Problems: Asks Questions Perception: Coherent Willingness to Engage in Self- High Management Activities: Readiness to Engage in Self- High Management Activities: Electronic Signature(s) Signed: 06/08/2015 5:19:40 PM By: [name] BSN, RN Entered By: [name] on 06/08/2015 15:22:28 [name], [name] EvertsJIM P. ([id number]) -------------------------------------------------------------------------------- Fall Risk Assessment Details Patient Name: [name], [name] Date of Service: 06/08/2015 3:00 PM Medical Record Number: [id number] Patient  Account Number: [id number] Date of Birth/Sex: 30-Jan-1961 [age] y.o. Male) Treating RN: [name], [name] Primary Care Physician: [name] Other Clinician: Referring Physician: Fulton MoleBurns, [name] Treating Physician/Extender: [name] in Treatment: 0 Fall Risk Assessment Items Have you had 2 or more falls in the last 12 monthso 0 No Have you had any fall that resulted in injury in the last 12 monthso 0 No FALL RISK ASSESSMENT: History of falling - immediate or within 3 months 0 No Secondary diagnosis 0 No Ambulatory aid None/bed rest/wheelchair/nurse 0 Yes Crutches/cane/walker 0 No Furniture 0 No IV Access/Saline Lock  0 No Gait/Training Normal/bed rest/immobile 0 Yes Weak 0 No Impaired 0 No Mental Status Oriented to own ability 0 Yes Electronic Signature(s) Signed: 06/08/2015 5:19:40 PM By: [name] Eric BSN, RN Entered By: [name] Eric on 06/08/2015 15:22:56 [name], [name] ([id number]) -------------------------------------------------------------------------------- Foot Assessment Details Patient Name: [name], [name] Date of Service: 06/08/2015 3:00 PM Medical Record Number: [id number] Patient Account Number: [id number] Date of Birth/Sex: [birth year] (54 y.o. Male) Treating RN: [name] Mealy, RN, [name], [place] Primary Care Physician: [name] Aldo Other Clinician: Referring Physician: Fulton Mole Treating Physician/Extender: [name] Re in Treatment: 0 Foot Assessment Items Site Locations + = Sensation present, - = Sensation absent, C = Callus, U = Ulcer R = Redness, W = Warmth, M = Maceration, PU = Pre-ulcerative lesion F = Fissure, S = Swelling, D = Dryness Assessment Right: Left: Other Deformity: No No Prior Foot Ulcer: No No Prior Amputation: No No Charcot Joint: No No Ambulatory Status: Ambulatory Without Help Gait: Steady Electronic Signature(s) Signed: 06/08/2015 5:19:40 PM By: [name] Eric BSN, RN Entered By: [name] Eric on 06/08/2015 15:23:18 [name],  [name] ([id number]) -------------------------------------------------------------------------------- Nutrition Risk Assessment Details Patient Name: Noorani, Kein P. Date of Service: 06/08/2015 3:00 PM Medical Record Number: [id number] Patient Account Number: [id number] Date of Birth/Sex: 05-Jun-1961 (54 y.o. Male) Treating RN: [name] Mealy, RN, [name], [name] Primary Care Physician: [name] Aldo Other Clinician: Referring Physician: Fulton Mole Treating Physician/Extender: [name] Re in Treatment: 0 Height (in): 75 Weight (lbs): 321 Body Mass Index (BMI): 40.1 Nutrition Risk Assessment Items NUTRITION RISK SCREEN: I have an illness or condition that made me change the kind and/or 0 No amount of food I eat I eat fewer than two meals per day 0 No I eat few fruits and vegetables, or milk products 0 No I have three or more drinks of beer, liquor or wine almost every day 0 No I have tooth or mouth problems that make it hard for me to eat 0 No I don't always have enough money to buy the food I need 0 No I eat alone most of the time 0 No I take three or more different prescribed or over-the-counter drugs a 0 No day Without wanting to, I have lost or gained 10 pounds in the last six 0 No months I am not always physically able to shop, cook and/or feed myself 0 No Nutrition Protocols Good Risk Protocol 0 No interventions needed Moderate Risk Protocol Electronic Signature(s) Signed: 06/08/2015 5:19:40 PM By: [name] Eric BSN, RN Entered By: [name] Eric on 06/08/2015 15:23:02

## 2015-06-15 ENCOUNTER — Encounter: Payer: Medicare Other | Admitting: Surgery

## 2015-06-15 DIAGNOSIS — E11622 Type 2 diabetes mellitus with other skin ulcer: Secondary | ICD-10-CM | POA: Diagnosis not present

## 2015-06-15 NOTE — Progress Notes (Addendum)
Hilliard ClarkCAHILL, Sabien P. ([id number]) Visit Report for 06/15/2015 Chief Complaint Document Details Patient Name: [name], [name] EvertsJIM P. Date of Service: 06/15/2015 9:00 AM Medical Record Number: [id number] Patient Account Number: [id number] Date of Birth/Sex: 1960-12-23 31(54 y.o. Male) Treating RN: Phillis HaggisPinkerton, Debi Primary Care Physician: Hillery AldoPATEL, SARAH Other Clinician: Referring Physician: Hillery AldoPATEL, SARAH Treating Physician/Extender: Rudene ReBritto, Gentri Guardado Weeks in Treatment: 1 Information Obtained from: Patient Chief Complaint Patient presents to the wound care center with burn wound(s)left lower extremity ankle and foot for one week Electronic Signature(s) Signed: 06/15/2015 9:46:34 AM By: Evlyn KannerBritto, Donne Baley MD, FACS Entered By: Evlyn KannerBritto, Huxton Glaus on 06/15/2015 09:46:34 Ouellet, [name] EvertsJIM P. ([id number]) -------------------------------------------------------------------------------- HPI Details Patient Name: [name], [name] Date of Service: 06/15/2015 9:00 AM Medical Record Number: [id number] Patient Account Number: [id number] Date of Birth/Sex: 1960-12-23 46(54 y.o. Male) Treating RN: Phillis HaggisPinkerton, Debi Primary Care Physician: Hillery AldoPATEL, SARAH Other Clinician: Referring Physician: Hillery AldoPATEL, SARAH Treating Physician/Extender: Rudene ReBritto, Akhil Piscopo Weeks in Treatment: 1 History of Present Illness Location: left lower extremity ankle and foot Quality: Patient reports No Pain. Severity: Patient states wound are getting worse. Duration: Patient has had the wound for < 1 weeks prior to presenting for treatment Context: The wound occurred when the patient was having a pedicure and due to his neuropathy didn't feel the temperature of the hot water Modifying Factors: Other treatment(s) tried include:on Bactrim and Silvadene Associated Signs and Symptoms: Patient reports having increase swelling office left lower extremity.Marland Kitchen. HPI Description: 54 year old gentleman who was referred to as for a burn of the left foot which she has had for about a week. He  apparently was getting a pedicure at a salon and they used water which was hot and the patient did not feel it because of his diabetic neuropathy. After he got home he developed blisters on the left foot which then opened up and continued to lose fluid. Past medical history is significant for diabetes mellitus, alcohol abuse, neuropathy, depression, COPD, CHF, hypertension, tracheal stenosis, status post cardiac stent placement, history of PE. He has never been a smoker and as noted has been an alcoholic in the past but given up alcohol for the last 3 years. He was put on Bactrim, Silvadene and asked to go to either a burn center wound center. of note he has not had a significant discrepancy in the size of his left lower extremity and right lower extremity in the past. 06/15/2015 -- he had a left lower extremity venous Dopplers ultrasound done on 06/08/2015 -- IMPRESSION:1. No evidence of lower extremity deep vein thrombosis, LEFT. he still continues to have redness and swelling of his left lower extremity and has moderate amount of discharge. Electronic Signature(s) Signed: 06/15/2015 9:47:03 AM By: Evlyn KannerBritto, Jager Koska MD, FACS Previous Signature: 06/15/2015 9:18:39 AM Version By: Evlyn KannerBritto, Minka Knight MD, FACS Entered By: Evlyn KannerBritto, Kolson Chovanec on 06/15/2015 09:47:03 Benally, [name] EvertsJIM P. ([id number]) -------------------------------------------------------------------------------- Physical Exam Details Patient Name: [name], [name] Date of Service: 06/15/2015 9:00 AM Medical Record Number: [id number] Patient Account Number: [id number] Date of Birth/Sex: 1960-12-23 6(54 y.o. Male) Treating RN: Phillis HaggisPinkerton, Debi Primary Care Physician: Hillery AldoPATEL, SARAH Other Clinician: Referring Physician: Hillery AldoPATEL, SARAH Treating Physician/Extender: Rudene ReBritto, Mikki Ziff Weeks in Treatment: 1 Constitutional . Pulse regular. Respirations normal and unlabored. Afebrile. . Eyes Nonicteric. Reactive to light. Ears, Nose, Mouth, and Throat Lips, teeth,  and gums WNL.Marland Kitchen. Moist mucosa without lesions. Neck supple and nontender. No palpable supraclavicular or cervical adenopathy. Normal sized without goiter. Respiratory WNL. No retractions.. Cardiovascular Pedal Pulses WNL. No clubbing, cyanosis or edema. Genitourinary (GU) No hydrocele, spermatocele, tenderness  of the cord, or testicular mass.Marland Kitchen Penis without lesions.Renetta Chalk without lesions. No cystocele, or rectocele. Pelvic support intact, no discharge.Marland Kitchen Urethra without masses, tenderness or scarring.Marland Kitchen Lymphatic No adneopathy. No adenopathy. No adenopathy. Musculoskeletal Adexa without tenderness or enlargement.. Digits and nails w/o clubbing, cyanosis, infection, petechiae, ischemia, or inflammatory conditions.. Integumentary (Hair, Skin) No suspicious lesions. No crepitus or fluctuance. No peri-wound warmth or erythema. No masses.Marland Kitchen Psychiatric Judgement and insight Intact.. No evidence of depression, anxiety, or agitation.. Notes the second-degree burns on his toes are looking fairly clean and the area on the ankle is clean to. The swelling has gone down significantly but continues to have some amount of cellulitis. Electronic Signature(s) Signed: 06/15/2015 9:47:39 AM By: Evlyn Kanner MD, FACS Entered By: Evlyn Kanner on 06/15/2015 09:47:37 Klingensmith, [name] ([id number]) -------------------------------------------------------------------------------- Physician Orders Details Patient Name: [name], [name] Date of Service: 06/15/2015 9:00 AM Medical Record Number: [id number] Patient Account Number: [id number] Date of Birth/Sex: 1961/04/14 (54 y.o. Male) Treating RN: Phillis Haggis Primary Care Physician: Hillery Aldo Other Clinician: Referring Physician: Hillery Aldo Treating Physician/Extender: Rudene Re in Treatment: 1 Verbal / Phone Orders: Yes Clinician: Pinkerton, Debi Read Back and Verified: Yes Diagnosis Coding Wound Cleansing Wound #1 Left,Proximal Lower  Leg o Cleanse wound with mild soap and water o May Shower, gently pat wound dry prior to applying new dressing. o May shower with protection. Wound #10 Left,Lateral Lower Leg o Cleanse wound with mild soap and water o May Shower, gently pat wound dry prior to applying new dressing. o May shower with protection. Wound #2 Left,Distal Lower Leg o Cleanse wound with mild soap and water o May Shower, gently pat wound dry prior to applying new dressing. o May shower with protection. Wound #3 Left Toe Great o Cleanse wound with mild soap and water o May Shower, gently pat wound dry prior to applying new dressing. o May shower with protection. Wound #4 Left Toe Second o Cleanse wound with mild soap and water o May Shower, gently pat wound dry prior to applying new dressing. o May shower with protection. Wound #5 Left Toe Third o Cleanse wound with mild soap and water o May Shower, gently pat wound dry prior to applying new dressing. o May shower with protection. Wound #6 Left Toe Fourth o Cleanse wound with mild soap and water o May Shower, gently pat wound dry prior to applying new dressing. o May shower with protection. Wound #7 Right Toe Second o Cleanse wound with mild soap and water Whitford, Barron P. ([id number]) o May Shower, gently pat wound dry prior to applying new dressing. o May shower with protection. Wound #8 Right Toe Third o Cleanse wound with mild soap and water o May Shower, gently pat wound dry prior to applying new dressing. o May shower with protection. Wound #9 Right Toe Fourth o Cleanse wound with mild soap and water o May Shower, gently pat wound dry prior to applying new dressing. o May shower with protection. Skin Barriers/Peri-Wound Care Wound #1 Left,Proximal Lower Leg o Moisturizing lotion Wound #10 Left,Lateral Lower Leg o Moisturizing lotion Wound #2 Left,Distal Lower Leg o Moisturizing  lotion Wound #3 Left Toe Great o Moisturizing lotion Wound #4 Left Toe Second o Moisturizing lotion Wound #5 Left Toe Third o Moisturizing lotion Wound #6 Left Toe Fourth o Moisturizing lotion Wound #7 Right Toe Second o Moisturizing lotion Wound #8 Right Toe Third o Moisturizing lotion Wound #9 Right Toe Fourth o Moisturizing lotion Primary Wound Dressing Wound #  1 Left,Proximal Lower Leg o Silvadene Cream Wound #10 Left,Lateral Lower Leg Morro, Julias P. ([id number]) o Silvadene Cream Wound #2 Left,Distal Lower Leg o Silvadene Cream Wound #3 Left Toe Great o Silvadene Cream Wound #4 Left Toe Second o Silvadene Cream Wound #5 Left Toe Third o Silvadene Cream Wound #6 Left Toe Fourth o Silvadene Cream Wound #7 Right Toe Second o Silvadene Cream Wound #8 Right Toe Third o Silvadene Cream Wound #9 Right Toe Fourth o Silvadene Cream Secondary Dressing Wound #1 Left,Proximal Lower Leg o Gauze and Kerlix/Conform Wound #10 Left,Lateral Lower Leg o Gauze and Kerlix/Conform Wound #2 Left,Distal Lower Leg o Gauze and Kerlix/Conform Wound #3 Left Toe Great o Gauze and Kerlix/Conform Wound #4 Left Toe Second o Gauze and Kerlix/Conform Wound #5 Left Toe Third o Gauze and Kerlix/Conform Wound #6 Left Toe Fourth o Gauze and Kerlix/Conform Wound #7 Right Toe Second Faries, Trevin P. ([id number]) o Gauze and Kerlix/Conform Wound #8 Right Toe Third o Gauze and Kerlix/Conform Wound #9 Right Toe Fourth o Gauze and Kerlix/Conform Dressing Change Frequency Wound #1 Left,Proximal Lower Leg o Change dressing every day. Wound #10 Left,Lateral Lower Leg o Change dressing every day. Wound #2 Left,Distal Lower Leg o Change dressing every day. Wound #3 Left Toe Great o Change dressing every day. Wound #4 Left Toe Second o Change dressing every day. Wound #5 Left Toe Third o Change dressing every day. Wound #6 Left  Toe Fourth o Change dressing every day. Wound #7 Right Toe Second o Change dressing every day. Wound #8 Right Toe Third o Change dressing every day. Wound #9 Right Toe Fourth o Change dressing every day. Follow-up Appointments Wound #1 Left,Proximal Lower Leg o Return Appointment in 1 week. Wound #10 Left,Lateral Lower Leg o Return Appointment in 1 week. Wound #2 Left,Distal Lower Leg o Return Appointment in 1 week. Grieshop, Jonavin P. ([id number]) Wound #3 Left Toe Great o Return Appointment in 1 week. Wound #4 Left Toe Second o Return Appointment in 1 week. Wound #5 Left Toe Third o Return Appointment in 1 week. Wound #6 Left Toe Fourth o Return Appointment in 1 week. Wound #7 Right Toe Second o Return Appointment in 1 week. Wound #8 Right Toe Third o Return Appointment in 1 week. Wound #9 Right Toe Fourth o Return Appointment in 1 week. Additional Orders / Instructions Wound #1 Left,Proximal Lower Leg o Other: - Patient to go to hospital ASAP for a scan to rule out DVT. Per MD instructions, if scan is positive, he is supposed to seek medical attention in the ER. Wound #10 Left,Lateral Lower Leg o Other: - Patient to go to hospital ASAP for a scan to rule out DVT. Per MD instructions, if scan is positive, he is supposed to seek medical attention in the ER. Wound #2 Left,Distal Lower Leg o Other: - Patient to go to hospital ASAP for a scan to rule out DVT. Per MD instructions, if scan is positive, he is supposed to seek medical attention in the ER. Wound #3 Left Toe Great o Other: - Patient to go to hospital ASAP for a scan to rule out DVT. Per MD instructions, if scan is positive, he is supposed to seek medical attention in the ER. Wound #4 Left Toe Second o Other: - Patient to go to hospital ASAP for a scan to rule out DVT. Per MD instructions, if scan is positive, he is supposed to seek medical attention in the ER. Wound #5 Left Toe  Third o  Other: - Patient to go to hospital ASAP for a scan to rule out DVT. Per MD instructions, if scan is positive, he is supposed to seek medical attention in the ER. Wound #6 Left Toe Fourth Doughman, Phuoc P. ([id number]) o Other: - Patient to go to hospital ASAP for a scan to rule out DVT. Per MD instructions, if scan is positive, he is supposed to seek medical attention in the ER. Wound #7 Right Toe Second o Other: - Patient to go to hospital ASAP for a scan to rule out DVT. Per MD instructions, if scan is positive, he is supposed to seek medical attention in the ER. Wound #8 Right Toe Third o Other: - Patient to go to hospital ASAP for a scan to rule out DVT. Per MD instructions, if scan is positive, he is supposed to seek medical attention in the ER. Wound #9 Right Toe Fourth o Other: - Patient to go to hospital ASAP for a scan to rule out DVT. Per MD instructions, if scan is positive, he is supposed to seek medical attention in the ER. Medications-please add to medication list. Wound #1 Left,Proximal Lower Leg o DominguezO. Antibiotics - Doxycycline Wound #10 Left,Lateral Lower Leg o DominguezO. Antibiotics - Doxycycline Wound #2 Left,Distal Lower Leg o DominguezO. Antibiotics - Doxycycline Wound #3 Left Toe Great o DominguezO. Antibiotics - Doxycycline Wound #4 Left Toe Second o DominguezO. Antibiotics - Doxycycline Wound #5 Left Toe Third o DominguezO. Antibiotics - Doxycycline Wound #6 Left Toe Fourth o DominguezO. Antibiotics - Doxycycline Wound #7 Right Toe Second o DominguezO. Antibiotics - Doxycycline Wound #8 Right Toe Third o DominguezO. Antibiotics - Doxycycline Wound #9 Right Toe Fourth o DominguezO. Antibiotics - Doxycycline Patient Medications Allergies: PCN, cephalexin, hydrochlorothiazide Doetsch, Axiel P. ([id number]) Notifications Medication Indication Start End doxycycline hyclate 06/15/2015 DOSE 1 - oral 100 mg capsule - 1 capsule oral twice a day Electronic Signature(s) Signed: 06/15/2015  5:05:23 PM By: Evlyn Kanner MD, FACS Signed: 06/15/2015 5:45:13 PM By: Alejandro Mulling Previous Signature: 06/15/2015 9:46:03 AM Version By: Evlyn Kanner MD, FACS Entered By: Alejandro Mulling on 06/15/2015 10:11:00 Ikeda, [name] ([id number]) -------------------------------------------------------------------------------- Problem List Details Patient Name: Starkman, Chloe P. Date of Service: 06/15/2015 9:00 AM Medical Record Number: [id number] Patient Account Number: [id number] Date of Birth/Sex: 06-Jun-1961 (54 y.o. Male) Treating RN: Phillis Haggis Primary Care Physician: Hillery Aldo Other Clinician: Referring Physician: Hillery Aldo Treating Physician/Extender: Rudene Re in Treatment: 1 Active Problems ICD-10 Encounter Code Description Active Date Diagnosis E11.622 Type 2 diabetes mellitus with other skin ulcer 06/08/2015 Yes T25.222A Burn of second degree of left foot, initial encounter 06/08/2015 Yes T25.292A Burn of second degree of multiple sites of left ankle and 06/08/2015 Yes foot, initial encounter I82.4Y2 Acute embolism and thrombosis of unspecified deep veins 06/08/2015 Yes of left proximal lower extremity Inactive Problems Resolved Problems Electronic Signature(s) Signed: 06/15/2015 9:46:28 AM By: Evlyn Kanner MD, FACS Entered By: Evlyn Kanner on 06/15/2015 09:46:28 Chow, [name] ([id number]) -------------------------------------------------------------------------------- Progress Note Details Patient Name: Godbee, Rayane P. Date of Service: 06/15/2015 9:00 AM Medical Record Number: [id number] Patient Account Number: [id number] Date of Birth/Sex: 1961/02/01 (54 y.o. Male) Treating RN: Phillis Haggis Primary Care Physician: Hillery Aldo Other Clinician: Referring Physician: Hillery Aldo Treating Physician/Extender: Rudene Re in Treatment: 1 Subjective Chief Complaint Information obtained from Patient Patient presents to the wound care center  with burn wound(s)left lower extremity ankle and foot for one week History of Present Illness (HPI) The following HPI elements were documented  for the patient's wound: Location: left lower extremity ankle and foot Quality: Patient reports No Pain. Severity: Patient states wound are getting worse. Duration: Patient has had the wound for < 1 weeks prior to presenting for treatment Context: The wound occurred when the patient was having a pedicure and due to his neuropathy didn't feel the temperature of the hot water Modifying Factors: Other treatment(s) tried include:on Bactrim and Silvadene Associated Signs and Symptoms: Patient reports having increase swelling office left lower extremity.. 54 year old gentleman who was referred to as for a burn of the left foot which she has had for about a week. He apparently was getting a pedicure at a salon and they used water which was hot and the patient did not feel it because of his diabetic neuropathy. After he got home he developed blisters on the left foot which then opened up and continued to lose fluid. Past medical history is significant for diabetes mellitus, alcohol abuse, neuropathy, depression, COPD, CHF, hypertension, tracheal stenosis, status post cardiac stent placement, history of PE. He has never been a smoker and as noted has been an alcoholic in the past but given up alcohol for the last 3 years. He was put on Bactrim, Silvadene and asked to go to either a burn center wound center. of note he has not had a significant discrepancy in the size of his left lower extremity and right lower extremity in the past. 06/15/2015 -- he had a left lower extremity venous Dopplers ultrasound done on 06/08/2015 -- IMPRESSION:1. No evidence of lower extremity deep vein thrombosis, LEFT. he still continues to have redness and swelling of his left lower extremity and has moderate amount of discharge. Objective Jesus, Emeka P.  ([id number]) Constitutional Pulse regular. Respirations normal and unlabored. Afebrile. Vitals Time Taken: 9:04 AM, Height: 75 in, Weight: 321 lbs, BMI: 40.1, Temperature: 98.1 F, Pulse: 88 bpm, Respiratory Rate: 20 breaths/min, Blood Pressure: 136/86 mmHg. Eyes Nonicteric. Reactive to light. Ears, Nose, Mouth, and Throat Lips, teeth, and gums WNL.Marland Kitchen Moist mucosa without lesions. Neck supple and nontender. No palpable supraclavicular or cervical adenopathy. Normal sized without goiter. Respiratory WNL. No retractions.. Cardiovascular Pedal Pulses WNL. No clubbing, cyanosis or edema. Genitourinary (GU) No hydrocele, spermatocele, tenderness of the cord, or testicular mass.Marland Kitchen Penis without lesions.Renetta Chalk without lesions. No cystocele, or rectocele. Pelvic support intact, no discharge.Marland Kitchen Urethra without masses, tenderness or scarring.Marland Kitchen Lymphatic No adneopathy. No adenopathy. No adenopathy. Musculoskeletal Adexa without tenderness or enlargement.. Digits and nails w/o clubbing, cyanosis, infection, petechiae, ischemia, or inflammatory conditions.Marland Kitchen Psychiatric Judgement and insight Intact.. No evidence of depression, anxiety, or agitation.. General Notes: the second-degree burns on his toes are looking fairly clean and the area on the ankle is clean to. The swelling has gone down significantly but continues to have some amount of cellulitis. Integumentary (Hair, Skin) No suspicious lesions. No crepitus or fluctuance. No peri-wound warmth or erythema. No masses.. Wound #1 status is Open. Original cause of wound was Thermal Burn. The wound is located on the Left,Proximal Lower Leg. The wound measures 2cm length x 3.1cm width x 0.1cm depth; 4.869cm^2 area and 0.487cm^3 volume. The wound is limited to skin breakdown. There is no tunneling or undermining noted. There is a medium amount of serosanguineous drainage noted. The wound margin is distinct with the outline attached to the wound  base. There is no granulation within the wound bed. There is a large (67- 100%) amount of necrotic tissue within the wound bed including Adherent Slough. The periwound skin  Fontanez, Ganesh P. ([id number]) appearance exhibited: Localized Edema, Erythema. The periwound skin appearance did not exhibit: Callus, Crepitus, Excoriation, Fluctuance, Friable, Induration, Rash, Scarring, Dry/Scaly, Maceration, Moist, Atrophie Blanche, Cyanosis, Ecchymosis, Hemosiderin Staining, Mottled, Pallor, Rubor. The surrounding wound skin color is noted with erythema which is circumferential. Erythema is marked. Periwound temperature was noted as No Abnormality. Wound #10 status is Open. Original cause of wound was Thermal Burn. The wound is located on the Left,Lateral Lower Leg. The wound measures 4.1cm length x 1.5cm width x 0.1cm depth; 4.83cm^2 area and 0.483cm^3 volume. The wound is limited to skin breakdown. There is no tunneling or undermining noted. There is a medium amount of serosanguineous drainage noted. The wound margin is distinct with the outline attached to the wound base. There is small (1-33%) pink, pale granulation within the wound bed. There is a medium (34-66%) amount of necrotic tissue within the wound bed including Adherent Slough. The periwound skin appearance exhibited: Localized Edema, Moist. The periwound skin appearance did not exhibit: Callus, Crepitus, Excoriation, Fluctuance, Friable, Induration, Rash, Scarring, Dry/Scaly, Maceration, Atrophie Blanche, Cyanosis, Ecchymosis, Hemosiderin Staining, Mottled, Pallor, Rubor, Erythema. Periwound temperature was noted as No Abnormality. Wound #2 status is Open. Original cause of wound was Thermal Burn. The wound is located on the Left,Distal Lower Leg. The wound measures 4.3cm length x 12.8cm width x 0.2cm depth; 43.228cm^2 area and 8.646cm^3 volume. The wound is limited to skin breakdown. There is no tunneling or undermining noted. There is a  medium amount of serosanguineous drainage noted. The wound margin is distinct with the outline attached to the wound base. There is small (1-33%) red, pink granulation within the wound bed. There is a large (67-100%) amount of necrotic tissue within the wound bed including Adherent Slough. The periwound skin appearance exhibited: Moist. The periwound skin appearance did not exhibit: Callus, Crepitus, Excoriation, Fluctuance, Friable, Induration, Localized Edema, Rash, Scarring, Dry/Scaly, Maceration, Atrophie Blanche, Cyanosis, Ecchymosis, Hemosiderin Staining, Mottled, Pallor, Rubor, Erythema. Periwound temperature was noted as No Abnormality. Wound #3 status is Open. Original cause of wound was Thermal Burn. The wound is located on the Left Toe Great. The wound measures 2.8cm length x 1.8cm width x 0.1cm depth; 3.958cm^2 area and 0.396cm^3 volume. The wound is limited to skin breakdown. There is no tunneling or undermining noted. There is a small amount of serosanguineous drainage noted. The wound margin is distinct with the outline attached to the wound base. There is small (1-33%) pink, pale granulation within the wound bed. There is a large (67-100%) amount of necrotic tissue within the wound bed including Adherent Slough. The periwound skin appearance exhibited: Localized Edema, Moist. The periwound skin appearance did not exhibit: Callus, Crepitus, Excoriation, Fluctuance, Friable, Induration, Rash, Scarring, Dry/Scaly, Maceration, Atrophie Blanche, Cyanosis, Ecchymosis, Hemosiderin Staining, Mottled, Pallor, Rubor, Erythema. Periwound temperature was noted as No Abnormality. Wound #4 status is Open. Original cause of wound was Thermal Burn. The wound is located on the Left Toe Second. The wound measures 0.9cm length x 1.1cm width x 0.1cm depth; 0.778cm^2 area and 0.078cm^3 volume. The wound is limited to skin breakdown. There is no tunneling or undermining noted. There is a medium amount  of drainage noted. The wound margin is distinct with the outline attached to the wound base. There is small (1-33%) red granulation within the wound bed. There is a large (67-100%) amount of necrotic tissue within the wound bed including Adherent Slough. The periwound skin appearance exhibited: Moist. The periwound skin appearance did not exhibit: Callus, Crepitus,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

## 2015-06-16 NOTE — Progress Notes (Signed)
BRAXSTON, QUINTER ([id number]) Visit Report for 06/15/2015 Arrival Information Details Patient Name: [name], [name]. Date of Service: 06/15/2015 9:00 AM Medical Record Number: [id number] Patient Account Number: [id number] Date of Birth/Sex: Mar 02, 1961 (54 y.o. Male) Treating RN: Phillis Haggis Primary Care Physician: Hillery Aldo Other Clinician: Referring Physician: Hillery Aldo Treating Physician/Extender: Rudene Re in Treatment: 1 Visit Information History Since Last Visit All ordered tests and consults were completed: No Patient Arrived: Ambulatory Added or deleted any medications: No Arrival Time: 09:02 Signs or symptoms of abuse/neglect since last No Accompanied By: self visito Transfer Assistance: None Hospitalized since last visit: No Patient Identification Verified: Yes Pain Present Now: No Secondary Verification Process Yes Completed: Patient Requires Transmission- No Based Precautions: Patient Has Alerts: Yes Patient Alerts: Patient on Blood Thinner plavix, DM 2 ABI [place] BILATERAL >220 Electronic Signature(s) Signed: 06/15/2015 5:45:13 PM By: Alejandro Mulling Entered By: Alejandro Mulling on 06/15/2015 09:04:34 Carandang, [name] ([id number]) -------------------------------------------------------------------------------- Clinic Level of Care Assessment Details Patient Name: [name]. Date of Service: 06/15/2015 9:00 AM Medical Record Number: [id number] Patient Account Number: [id number] Date of Birth/Sex: 11-24-1960 (54 y.o. Male) Treating RN: Phillis Haggis Primary Care Physician: Hillery Aldo Other Clinician: Referring Physician: Hillery Aldo Treating Physician/Extender: Rudene Re in Treatment: 1 Clinic Level of Care Assessment Items TOOL 4 Quantity Score  - Use when only an EandM is performed on FOLLOW-UP visit 0 ASSESSMENTS - Nursing Assessment / Reassessment  - Reassessment of Co-morbidities (includes updates in patient status) 0 X  - Reassessment of Adherence to Treatment Plan 1 5 ASSESSMENTS - Wound and Skin Assessment / Reassessment  - Simple Wound Assessment / Reassessment - one wound 0 X - Complex Wound Assessment / Reassessment - multiple wounds 1 5  - Dermatologic / Skin Assessment (not related to wound area) 0 ASSESSMENTS - Focused Assessment  - Circumferential Edema Measurements - multi extremities 0  - Nutritional Assessment / Counseling / Intervention 0  - Lower Extremity Assessment (monofilament, tuning fork, pulses) 0  - Peripheral Arterial Disease Assessment (using hand held doppler) 0 ASSESSMENTS - Ostomy and/or Continence Assessment and Care  - Incontinence Assessment and Management 0  - Ostomy Care Assessment and Management (repouching, etc.) 0 PROCESS - Coordination of Care X - Simple Patient / Family Education for ongoing care 1 15  - Complex (extensive) Patient / Family Education for ongoing care 0  - Staff obtains Chiropractor, Records, Test Results / Process Orders 0  - Staff telephones HHA, Nursing Homes / Clarify orders / etc 0  - Routine Transfer to another Facility (non-emergent condition) 0 Luff, Subhan P. ([id number])  - Routine Hospital Admission (non-emergent condition) 0  - New Admissions / Manufacturing engineer / Ordering NPWT, Apligraf, etc. 0  - Emergency Hospital Admission (emergent condition) 0 X - Simple Discharge Coordination 1 10  - Complex (extensive) Discharge Coordination 0 PROCESS - Special Needs  - Pediatric / Minor Patient Management 0  - Isolation Patient Management 0  - Hearing / Language / Visual special needs 0  - Assessment of Community assistance (transportation, D/C planning, etc.) 0  - Additional assistance / Altered mentation 0  - Support Surface(s) Assessment (bed, cushion, seat, etc.) 0 INTERVENTIONS - Wound Cleansing / Measurement  - Simple Wound Cleansing - one wound 0 X - Complex Wound Cleansing - multiple  wounds 1 5 X - Wound Imaging (photographs - any number of wounds) 1 5  - Wound Tracing (instead of photographs) 0  - Simple Wound Measurement -  one wound 0 []  - Complex Wound Measurement - multiple wounds 0 INTERVENTIONS - Wound Dressings X - Small Wound Dressing one or multiple wounds 1 10 X - Medium Wound Dressing one or multiple wounds 1 15 []  - Large Wound Dressing one or multiple wounds 0 []  - Application of Medications - topical 0 []  - Application of Medications - injection 0 INTERVENTIONS - Miscellaneous []  - External ear exam 0 Mcmillion, Martez P. ([id number]) []  - Specimen Collection (cultures, biopsies, blood, body fluids, etc.) 0 []  - Specimen(s) / Culture(s) sent or taken to Lab for analysis 0 []  - Patient Transfer (multiple staff / Michiel Sites Lift / Similar devices) 0 []  - Simple Staple / Suture removal (25 or less) 0 []  - Complex Staple / Suture removal (26 or more) 0 []  - Hypo / Hyperglycemic Management (close monitor of Blood Glucose) 0 []  - Ankle / Brachial Index (ABI) - do not check if billed separately 0 X - Vital Signs 1 5 Has the patient been seen at the hospital within the last three years: Yes Total Score: 75 Level Of Care: New/Established - Level 2 Electronic Signature(s) Signed: 06/15/2015 5:45:13 PM By: Alejandro Mulling Entered By: Alejandro Mulling on 06/15/2015 09:57:59 Fentress, [name] ([id number]) -------------------------------------------------------------------------------- Encounter Discharge Information Details Patient Name: [name], [name] Date of Service: 06/15/2015 9:00 AM Medical Record Number: [id number] Patient Account Number: [id number] Date of Birth/Sex: 11/18/1960 (54 y.o. Male) Treating RN: Phillis Haggis Primary Care Physician: Hillery Aldo Other Clinician: Referring Physician: Hillery Aldo Treating Physician/Extender: Rudene Re in Treatment: 1 Encounter Discharge Information Items Discharge Pain Level: 0 Discharge Condition:  Stable Ambulatory Status: Ambulatory Discharge Destination: Home Transportation: Private Auto Accompanied By: self Schedule Follow-up Appointment: Yes Medication Reconciliation completed and provided to Patient/Care Yes Brand Siever: Provided on Clinical Summary of Care: 06/15/2015 Form Type Recipient Paper Patient JC Electronic Signature(s) Signed: 06/15/2015 5:45:13 PM By: Alejandro Mulling Previous Signature: 06/15/2015 9:58:20 AM Version By: Gwenlyn Perking Entered By: Alejandro Mulling on 06/15/2015 09:59:27 Aguayo, [name] ([id number]) -------------------------------------------------------------------------------- Lower Extremity Assessment Details Patient Name: Sobecki, Malin P. Date of Service: 06/15/2015 9:00 AM Medical Record Number: [id number] Patient Account Number: [id number] Date of Birth/Sex: 08-11-1960 (54 y.o. Male) Treating RN: Phillis Haggis Primary Care Physician: Hillery Aldo Other Clinician: Referring Physician: Hillery Aldo Treating Physician/Extender: Rudene Re in Treatment: 1 Edema Assessment Assessed: [Left: No] [Right: No] [Left: Edema] [Right: :] Calf Left: Right: Point of Measurement: cm From Medial Instep 52.8 cm 45 cm Ankle Left: Right: Point of Measurement: cm From Medial Instep 33.4 cm 27 cm Vascular Assessment Pulses: Posterior Tibial Dorsalis Pedis Palpable: [Left:No] [Right:No] Doppler: [Left:Multiphasic] [Right:Multiphasic] Extremity colors, hair growth, and conditions: Extremity Color: [Left:Red] [Right:Normal] Hair Growth on Extremity: [Left:No] [Right:Yes] Temperature of Extremity: [Left:Warm] [Right:Warm] Capillary Refill: [Left:< 3 seconds] [Right:< 3 seconds] Toe Nail Assessment Left: Right: Thick: No No Discolored: No No Deformed: No No Improper Length and Hygiene: No No Electronic Signature(s) Signed: 06/15/2015 5:45:13 PM By: Alejandro Mulling Entered By: Alejandro Mulling on 06/15/2015 09:15:32 Polhamus, [name]  ([id number]) Renk, Jovon P. ([id number]) -------------------------------------------------------------------------------- Multi Wound Chart Details Patient Name: Sulton, Elin P. Date of Service: 06/15/2015 9:00 AM Medical Record Number: [id number] Patient Account Number: [id number] Date of Birth/Sex: 31-Oct-1960 (54 y.o. Male) Treating RN: Phillis Haggis Primary Care Physician: Hillery Aldo Other Clinician: Referring Physician: Hillery Aldo Treating Physician/Extender: Rudene Re in Treatment: 1 Vital Signs Height(in): 75 Pulse(bpm): 88 Weight(lbs): 321 Blood Pressure 136/86 (mmHg): Body Mass Index(BMI): 40 Temperature(F): 98.1 Respiratory Rate  20 (breaths/min): Photos: [1:No Photos] [10:No Photos] [2:No Photos] Wound Location: [1:Left Lower Leg - Proximal Left Lower Leg - Lateral Left, Distal Lower Leg] Wounding Event: [1:Thermal Burn] [10:Thermal Burn] [2:Thermal Burn] Primary Etiology: [1:2nd degree Burn] [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] Comorbid History: [1:Arrhythmia, Hypertension, Arrhythmia, Hypertension, Arrhythmia, Hypertension, Type II Diabetes, Neuropathy] [10:Type II Diabetes, Neuropathy] [2:Type II Diabetes, Neuropathy] Date Acquired: [1:05/29/2015] [10:05/29/2015] [2:05/29/2015] Weeks of Treatment: [1:1] [10:1] [2:1] Wound Status: [1:Open] [10:Open] [2:Open] Measurements L x W x D 2x3.1x0.1 [10:4.1x1.5x0.1] [2:4.3x12.8x0.2] (cm) Area (cm) : [1:4.869] [10:4.83] [2:43.228] Volume (cm) : [1:0.487] [10:0.483] [2:8.646] % Reduction in Area: [1:28.20%] [10:18.00%] [2:-32.70%] % Reduction in Volume: 28.30% [10:18.00%] [2:-32.70%] Classification: [1:Full Thickness Without Exposed Support Structures] [10:Grade 1] [2:Full Thickness Without Exposed Support Structures] HBO Classification: [1:Grade 1] [10:N/A] [2:Grade 1] Exudate Amount: [1:Medium] [10:Medium] [2:Medium] Exudate Type: [1:Serosanguineous] [10:Serosanguineous]  [2:Serosanguineous] Exudate Color: [1:red, brown] [10:red, brown] [2:red, brown] Wound Margin: [1:Distinct, outline attached Distinct, outline attached Distinct, outline attached] Granulation Amount: [1:None Present (0%)] [10:Small (1-33%)] [2:Small (1-33%)] Granulation Quality: [1:N/A] [10:Pink, Pale] [2:Red, Pink] Necrotic Amount: [1:Large (67-100%)] [10:Medium (34-66%)] [2:Large (67-100%)] Necrotic Tissue: [1:Adherent Slough] [10:Adherent Slough] [2:Adherent Slough] Exposed Structures: Konen, Koichi P. ([id number]) Fascia: No Fascia: No Fascia: No Fat: No Fat: No Fat: No Tendon: No Tendon: No Tendon: No Muscle: No Muscle: No Muscle: No Joint: No Joint: No Joint: No Bone: No Bone: No Bone: No Limited to Skin Limited to Skin Limited to Skin Breakdown Breakdown Breakdown Epithelialization: None None None Periwound Skin Texture: Edema: Yes Edema: Yes Edema: No Excoriation: No Excoriation: No Excoriation: No Induration: No Induration: No Induration: No Callus: No Callus: No Callus: No Crepitus: No Crepitus: No Crepitus: No Fluctuance: No Fluctuance: No Fluctuance: No Friable: No Friable: No Friable: No Rash: No Rash: No Rash: No Scarring: No Scarring: No Scarring: No Periwound Skin Maceration: No Moist: Yes Moist: Yes Moisture: Moist: No Maceration: No Maceration: No Dry/Scaly: No Dry/Scaly: No Dry/Scaly: No Periwound Skin Color: Erythema: Yes Atrophie Blanche: No Atrophie Blanche: No Atrophie Blanche: No Cyanosis: No Cyanosis: No Cyanosis: No Ecchymosis: No Ecchymosis: No Ecchymosis: No Erythema: No Erythema: No Hemosiderin Staining: No Hemosiderin Staining: No Hemosiderin Staining: No Mottled: No Mottled: No Mottled: No Pallor: No Pallor: No Pallor: No Rubor: No Rubor: No Rubor: No Erythema Location: Circumferential N/A N/A Erythema Measurement: Marked N/A N/A Temperature: No Abnormality No Abnormality No Abnormality Tenderness on  No No No Palpation: Wound Preparation: Ulcer Cleansing: Ulcer Cleansing: Ulcer Cleansing: Rinsed/Irrigated with Rinsed/Irrigated with Rinsed/Irrigated with Saline Saline Saline Topical Anesthetic Topical Anesthetic Topical Anesthetic Applied: Other: lidocaine Applied: Other: lidocaine Applied: Other: lidocaine 4% 4% 4% Wound Number: [id number]  Photos: No Photos No Photos No Photos Wound Location: Left Toe Great Left Toe Second Left Toe Third Wounding Event: Thermal Burn Thermal Burn Thermal Burn Primary Etiology: Diabetic Wound/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 the Lower Extremity the Lower Extremity Comorbid History: CAHILLDwon, Sky ([id number]) Arrhythmia, Hypertension, Arrhythmia, Hypertension, Arrhythmia, Hypertension, Type II Diabetes, Type II Diabetes, Type II Diabetes, Neuropathy Neuropathy Neuropathy Date Acquired: 05/29/2015 05/29/2015 05/29/2015 Weeks of Treatment: 1 1 1  Wound Status: Open Open Open Measurements L x W x D 2.8x1.8x0.1 0.9x1.1x0.1 2.5x1.8x0.1 (cm) Area (cm) : 3.958 0.778 3.534 Volume (cm) : 0.396 0.078 0.353 % Reduction in Area: 32.60% 36.50% 0.00% % Reduction in Volume: 32.50% 36.60% 0.00% Classification: Grade 1 Grade 1 Grade 1 HBO Classification: N/A N/A N/A Exudate Amount: Small Medium Medium Exudate  Type: Serosanguineous N/A Serosanguineous Exudate Color: red, brown N/A red, brown Wound Margin: Distinct, outline attached Distinct, outline attached Distinct, outline attached Granulation Amount: Small (1-33%) Small (1-33%) None Present (0%) Granulation Quality: Pink, Pale Red N/A Necrotic Amount: Large (67-100%) Large (67-100%) Large (67-100%) Necrotic Tissue: Adherent Slough Adherent [employer] Exposed Structures: Fascia: No Fascia: No Fascia: No Fat: No Fat: No Fat: No Tendon: No Tendon: No Tendon: No Muscle: No Muscle: No Muscle: No Joint: No Joint: No Joint: No Bone: No Bone:  No Bone: No Limited to Skin Limited to Skin Limited to Skin Breakdown Breakdown Breakdown Epithelialization: None None None Periwound Skin Texture: Edema: Yes Edema: No Edema: No Excoriation: No Excoriation: No Excoriation: No Induration: No Induration: No Induration: No Callus: No Callus: No Callus: No Crepitus: No Crepitus: No Crepitus: No Fluctuance: No Fluctuance: No Fluctuance: No Friable: No Friable: No Friable: No Rash: No Rash: No Rash: No Scarring: No Scarring: No Scarring: No Periwound Skin Moist: Yes Moist: Yes Moist: Yes Moisture: Maceration: No Maceration: No Maceration: No Dry/Scaly: No Dry/Scaly: No Dry/Scaly: No Periwound Skin Color: Atrophie Blanche: No Atrophie Blanche: No Atrophie Blanche: No Cyanosis: No Cyanosis: No Cyanosis: No Ecchymosis: No Ecchymosis: No Ecchymosis: No Erythema: No Erythema: No Erythema: No Hemosiderin Staining: No Hemosiderin Staining: No Hemosiderin Staining: No Mottled: No Mottled: No Mottled: No Leisinger, Sota P. ([id number]) Pallor: No Pallor: No Pallor: No Rubor: No Rubor: No Rubor: No Erythema Location: N/A N/A N/A Erythema Measurement: N/A N/A N/A Temperature: No Abnormality No Abnormality No Abnormality Tenderness on No No No Palpation: Wound Preparation: Ulcer Cleansing: Ulcer Cleansing: Ulcer Cleansing: Rinsed/Irrigated with Rinsed/Irrigated with Rinsed/Irrigated with Saline Saline Saline Topical Anesthetic Topical Anesthetic Topical Anesthetic Applied: Other: lidocaine Applied: Other: lidocaine Applied: Other: lidocaine 4% 4% 4% Wound Number: [id number]  Photos: No Photos No Photos No Photos Wound Location: Left Toe Fourth Right Toe Second Right Toe Third Wounding Event: Thermal Burn Thermal Burn Thermal Burn Primary Etiology: Diabetic Wound/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 the Lower Extremity the Lower Extremity Comorbid History: Arrhythmia,  Hypertension, Arrhythmia, Hypertension, Arrhythmia, Hypertension, Type II Diabetes, Type II Diabetes, Type II Diabetes, Neuropathy Neuropathy Neuropathy Date Acquired: 05/29/2015 05/29/2015 05/29/2015 Weeks of Treatment: 1 1 1  Wound Status: Open Open Open Measurements L x W x D 1.6x1x0.1 1.8x0.4x0.1 1.3x0.3x0.1 (cm) Area (cm) : 1.257 0.565 0.306 Volume (cm) : 0.126 0.057 0.031 % Reduction in Area: 27.30% 10.00% -18.10% % Reduction in Volume: 27.20% 9.50% -19.20% Classification: Grade 1 Grade 1 Grade 1 HBO Classification: N/A N/A N/A Exudate Amount: Small None Present None Present Exudate Type: Serosanguineous N/A N/A Exudate Color: red, brown N/A N/A Wound Margin: Distinct, outline attached Distinct, outline attached Distinct, outline attached Granulation Amount: None Present (0%) None Present (0%) None Present (0%) Granulation Quality: N/A N/A N/A Necrotic Amount: Large (67-100%) Large (67-100%) Large (67-100%) Necrotic Tissue: Eschar, Adherent Slough Eschar Eschar Exposed Structures: Fascia: No Fascia: No Fascia: No Fat: No Fat: No Fat: No Tendon: No Tendon: No Tendon: No Muscle: No Muscle: No Muscle: No Joint: No Joint: No Joint: No Bone: No Bone: No Bone: No Simmonds, Blayke P. ([id number]) Limited to Skin Limited to Skin Limited to Skin Breakdown Breakdown Breakdown Epithelialization: None None None Periwound Skin Texture: Edema: Yes Edema: No Edema: No Excoriation: No Excoriation: No Excoriation: No Induration: No Induration: No Induration: No Callus: No Callus: No Callus: No Crepitus: No Crepitus: No Crepitus: No Fluctuance: No Fluctuance: No Fluctuance: No Friable: No  Friable: No Friable: No Rash: No Rash: No Rash: No Scarring: No Scarring: No Scarring: No Periwound Skin Moist: Yes Dry/Scaly: Yes Dry/Scaly: Yes Moisture: Maceration: No Maceration: No Maceration: No Dry/Scaly: No Moist: No Moist: No Periwound Skin Color: Atrophie  Blanche: No Atrophie Blanche: No Atrophie Blanche: No Cyanosis: No Cyanosis: No Cyanosis: No Ecchymosis: No Ecchymosis: No Ecchymosis: No Erythema: No Erythema: No Erythema: No Hemosiderin Staining: No Hemosiderin Staining: No Hemosiderin Staining: No Mottled: No Mottled: No Mottled: No Pallor: No Pallor: No Pallor: No Rubor: No Rubor: No Rubor: No Erythema Location: N/A N/A N/A Erythema Measurement: N/A N/A N/A Temperature: No Abnormality No Abnormality No Abnormality Tenderness on No No No Palpation: Wound Preparation: Ulcer Cleansing: Ulcer Cleansing: Ulcer Cleansing: Rinsed/Irrigated with Rinsed/Irrigated with Rinsed/Irrigated with Saline Saline Saline Topical Anesthetic Topical Anesthetic Topical Anesthetic Applied: Other: lidocaine Applied: Other: Applied: Other: 4% LIDOCAINE 4% LIDOCAINE 4% Wound Number: 9 N/A N/A Photos: No Photos N/A N/A Wound Location: Right Toe Fourth N/A N/A Wounding Event: Thermal Burn N/A N/A Primary Etiology: Diabetic Wound/Ulcer of N/A N/A the Lower Extremity Comorbid History: Arrhythmia, Hypertension, N/A N/A Type II Diabetes, Neuropathy Date Acquired: 05/28/2015 N/A N/A Weeks of Treatment: 1 N/A N/A Wound Status: Open N/A N/A Measurements L x W x D 1.3x0.3x0.1 N/A N/A (cm) Remedios, Sherill P. ([id number]) Area (cm) : 0.306 N/A N/A Volume (cm) : 0.031 N/A N/A % Reduction in Area: 25.00% N/A N/A % Reduction in Volume: 24.40% N/A N/A Classification: Grade 1 N/A N/A HBO Classification: N/A N/A N/A Exudate Amount: None Present N/A N/A Exudate Type: N/A N/A N/A Exudate Color: N/A N/A N/A Wound Margin: Distinct, outline attached N/A N/A Granulation Amount: None Present (0%) N/A N/A Granulation Quality: N/A N/A N/A Necrotic Amount: Large (67-100%) N/A N/A Necrotic Tissue: Eschar N/A N/A Exposed Structures: Fascia: No N/A N/A Fat: No Tendon: No Muscle: No Joint: No Bone: No Limited to Skin Breakdown Epithelialization:  None N/A N/A Periwound Skin Texture: Edema: No N/A N/A Excoriation: No Induration: No Callus: No Crepitus: No Fluctuance: No Friable: No Rash: No Scarring: No Periwound Skin Dry/Scaly: Yes N/A N/A Moisture: Maceration: No Moist: No Periwound Skin Color: Atrophie Blanche: No N/A N/A Cyanosis: No Ecchymosis: No Erythema: No Hemosiderin Staining: No Mottled: No Pallor: No Rubor: No Erythema Location: N/A N/A N/A Erythema Measurement: N/A N/A N/A Temperature: No Abnormality N/A N/A Tenderness on No N/A N/A Palpation: Wound Preparation: Ulcer Cleansing: N/A N/A Rinsed/Irrigated with Snapp, Osvaldo P. ([id number]) Saline Topical Anesthetic Applied: Other: lidocaine 4% Treatment Notes Electronic Signature(s) Signed: 06/15/2015 5:45:13 PM By: Alejandro Mulling Entered By: Alejandro Mulling on 06/15/2015 09:31:50 Sida, [name] ([id number]) -------------------------------------------------------------------------------- Multi-Disciplinary Care Plan Details Patient Name: Alvizo, Aqib P. Date of Service: 06/15/2015 9:00 AM Medical Record Number: [id number] Patient Account Number: [id number] Date of Birth/Sex: 26-Jul-1960 (54 y.o. Male) Treating RN: Phillis Haggis Primary Care Physician: Hillery Aldo Other Clinician: Referring Physician: Hillery Aldo Treating Physician/Extender: Rudene Re in Treatment: 1 Active Inactive Orientation to the Wound Care Program Nursing Diagnoses: Knowledge deficit related to the wound healing center program Goals: Patient/caregiver will verbalize understanding of the Wound Healing Center Program Date Initiated: 06/08/2015 Goal Status: Active Interventions: Provide education on orientation to the wound center Notes: Wound/Skin Impairment Nursing Diagnoses: Impaired tissue integrity Knowledge deficit related to ulceration/compromised skin integrity Goals: Patient/caregiver will verbalize understanding of skin care regimen Date  Initiated: 06/08/2015 Goal Status: Active Ulcer/skin breakdown will have a volume reduction of 30% by week 4 Date Initiated: 06/08/2015 Goal Status: Active  Ulcer/skin breakdown will have a volume reduction of 50% by week 8 Date Initiated: 06/08/2015 Goal Status: Active Ulcer/skin breakdown will have a volume reduction of 80% by week 12 Date Initiated: 06/08/2015 Goal Status: Active Ulcer/skin breakdown will heal within 14 weeks Date Initiated: 06/08/2015 NEEDHAM, BIGGINS ([id number]) Goal Status: Active Interventions: Assess patient/caregiver ability to obtain necessary supplies Assess patient/caregiver ability to perform ulcer/skin care regimen upon admission and as needed Assess ulceration(s) every visit Provide education on smoking Provide education on ulcer and skin care Notes: Electronic Signature(s) Signed: 06/15/2015 5:45:13 PM By: Alejandro Mulling Entered By: Alejandro Mulling on 06/15/2015 09:31:42 Hollar, Henery P. ([id number]) -------------------------------------------------------------------------------- Pain Assessment Details Patient Name: Deltoro, Muzamil P. Date of Service: 06/15/2015 9:00 AM Medical Record Number: [id number] Patient Account Number: [id number] Date of Birth/Sex: 05/19/1961 (54 y.o. Male) Treating RN: Phillis Haggis Primary Care Physician: Hillery Aldo Other Clinician: Referring Physician: Hillery Aldo Treating Physician/Extender: Rudene Re in Treatment: 1 Active Problems Location of Pain Severity and Description of Pain Patient Has Paino No Site Locations Pain Management and Medication Current Pain Management: Electronic Signature(s) Signed: 06/15/2015 5:45:13 PM By: Alejandro Mulling Entered By: Alejandro Mulling on 06/15/2015 09:04:41 Geraci, [name] ([id number]) -------------------------------------------------------------------------------- Patient/Caregiver Education Details Patient Name: [name]. Date of Service: 06/15/2015 9:00  AM Medical Record Number: [id number] Patient Account Number: [id number] Date of Birth/Gender: 1960-11-28 (54 y.o. Male) Treating RN: Phillis Haggis Primary Care Physician: Hillery Aldo Other Clinician: Referring Physician: Hillery Aldo Treating Physician/Extender: Rudene Re in Treatment: 1 Education Assessment Education Provided To: Patient Education Topics Provided Wound/Skin Impairment: Handouts: Other: change dressing as ordered Methods: Demonstration, Explain/Verbal Responses: State content correctly Electronic Signature(s) Signed: 06/15/2015 5:45:13 PM By: Alejandro Mulling Entered By: Alejandro Mulling on 06/15/2015 09:59:44 Spearman, Sanjiv P. ([id number]) -------------------------------------------------------------------------------- Wound Assessment Details Patient Name: Yeatman, Dawid P. Date of Service: 06/15/2015 9:00 AM Medical Record Number: [id number] Patient Account Number: [id number] Date of Birth/Sex: Nov 17, 1960 (54 y.o. Male) Treating RN: Phillis Haggis Primary Care Physician: Hillery Aldo Other Clinician: Referring Physician: Hillery Aldo Treating Physician/Extender: Rudene Re in Treatment: 1 Wound Status Wound Number: 1 Primary 2nd degree Burn Etiology: Wound Location: Left Lower Leg - Proximal Wound Open Wounding Event: Thermal Burn Status: Date Acquired: 05/29/2015 Comorbid Arrhythmia, Hypertension, Type II Weeks Of Treatment: 1 History: Diabetes, Neuropathy Clustered Wound: No Photos Photo Uploaded By: Alejandro Mulling on 06/15/2015 16:47:35 Wound Measurements Length: (cm) 2 Width: (cm) 3.1 Depth: (cm) 0.1 Area: (cm) 4.869 Volume: (cm) 0.487 % Reduction in Area: 28.2% % Reduction in Volume: 28.3% Epithelialization: None Tunneling: No Undermining: No Wound Description Full Thickness Without Exposed Foul Odor Classification: Support Structures Diabetic Severity Grade 1 (Wagner): Wound Margin: Distinct, outline  attached Exudate Amount: Medium Exudate Type: Serosanguineous Exudate Color: red, brown After Cleansing: No Wound Bed Granulation Amount: None Present (0%) Exposed Structure Necrotic Amount: Large (67-100%) Fascia Exposed: No Kinn, Colbin P. ([id number]) Necrotic Quality: Adherent Slough Fat Layer Exposed: No Tendon Exposed: No Muscle Exposed: No Joint Exposed: No Bone Exposed: No Limited to Skin Breakdown Periwound Skin Texture Texture Color No Abnormalities Noted: No No Abnormalities Noted: No Callus: No Atrophie Blanche: No Crepitus: No Cyanosis: No Excoriation: No Ecchymosis: No Fluctuance: No Erythema: Yes Friable: No Erythema Location: Circumferential Induration: No Erythema Measurement: Marked Localized Edema: Yes Hemosiderin Staining: No Rash: No Mottled: No Scarring: No Pallor: No Rubor: No Moisture No Abnormalities Noted: No Temperature / Pain Dry / Scaly: No Temperature: No Abnormality Maceration: No Moist: No Wound Preparation Ulcer Cleansing: Rinsed/Irrigated  with Saline Topical Anesthetic Applied: Other: lidocaine 4%, Treatment Notes Wound #1 (Left, Proximal Lower Leg) 1. Cleansed with: Clean wound with Normal Saline 2. Anesthetic Topical Lidocaine 4% cream to wound bed prior to debridement 4. Dressing Applied: Silvadene Cream 5. Secondary Dressing Applied Gauze and Kerlix/Conform Notes netting Electronic Signature(s) Signed: 06/15/2015 5:45:13 PM By: Alejandro Mulling Entered By: Alejandro Mulling on 06/15/2015 09:18:16 Rosemeyer, [name] ([id number]) Iwan, [name] ([id number]) -------------------------------------------------------------------------------- Wound Assessment Details Patient Name: Winger, Brandonlee P. Date of Service: 06/15/2015 9:00 AM Medical Record Number: [id number] Patient Account Number: [id number] Date of Birth/Sex: 06-Mar-1961 (54 y.o. Male) Treating RN: Phillis Haggis Primary Care Physician: Hillery Aldo Other  Clinician: Referring Physician: Hillery Aldo Treating Physician/Extender: Rudene Re in Treatment: 1 Wound Status Wound Number: 10 Primary Diabetic Wound/Ulcer of the Lower Etiology: Extremity Wound Location: Left Lower Leg - Lateral Wound Open Wounding Event: Thermal Burn Status: Date Acquired: 05/29/2015 Comorbid Arrhythmia, Hypertension, Type II Weeks Of Treatment: 1 History: Diabetes, Neuropathy Clustered Wound: No Photos Photo Uploaded By: Alejandro Mulling on 06/15/2015 16:48:09 Wound Measurements Length: (cm) 4.1 Width: (cm) 1.5 Depth: (cm) 0.1 Area: (cm) 4.83 Volume: (cm) 0.483 % Reduction in Area: 18% % Reduction in Volume: 18% Epithelialization: None Tunneling: No Undermining: No Wound Description Classification: Grade 1 Wound Margin: Distinct, outline attached Exudate Amount: Medium Exudate Type: Serosanguineous Exudate Color: red, brown Foul Odor After Cleansing: No Wound Bed Granulation Amount: Small (1-33%) Exposed Structure Granulation Quality: Pink, Pale Fascia Exposed: No Necrotic Amount: Medium (34-66%) Fat Layer Exposed: No Necrotic Quality: Adherent Slough Tendon Exposed: No Bellino, Leiland P. ([id number]) Muscle Exposed: No Joint Exposed: No Bone Exposed: No Limited to Skin Breakdown Periwound Skin Texture Texture Color No Abnormalities Noted: No No Abnormalities Noted: No Callus: No Atrophie Blanche: No Crepitus: No Cyanosis: No Excoriation: No Ecchymosis: No Fluctuance: No Erythema: No Friable: No Hemosiderin Staining: No Induration: No Mottled: No Localized Edema: Yes Pallor: No Rash: No Rubor: No Scarring: No Temperature / Pain Moisture Temperature: No Abnormality No Abnormalities Noted: No Dry / Scaly: No Maceration: No Moist: Yes Wound Preparation Ulcer Cleansing: Rinsed/Irrigated with Saline Topical Anesthetic Applied: Other: lidocaine 4%, Treatment Notes Wound #10 (Left, Lateral Lower Leg) 1. Cleansed  with: Clean wound with Normal Saline 2. Anesthetic Topical Lidocaine 4% cream to wound bed prior to debridement 4. Dressing Applied: Silvadene Cream 5. Secondary Dressing Applied Gauze and Kerlix/Conform Notes netting Electronic Signature(s) Signed: 06/15/2015 5:45:13 PM By: Alejandro Mulling Entered By: Alejandro Mulling on 06/15/2015 09:23:27 Hauk, Aztlan Demetrius Charity ([id number]) -------------------------------------------------------------------------------- Wound Assessment Details Patient Name: Dino, Daton P. Date of Service: 06/15/2015 9:00 AM Medical Record Number: [id number] Patient Account Number: [id number] Date of Birth/Sex: 28-Oct-1960 (54 y.o. Male) Treating RN: Phillis Haggis Primary Care Physician: Hillery Aldo Other Clinician: Referring Physician: Hillery Aldo Treating Physician/Extender: Rudene Re in Treatment: 1 Wound Status Wound Number: 2 Primary 2nd degree Burn Etiology: Wound Location: Left, Distal Lower Leg Wound Open Wounding Event: Thermal Burn Status: Date Acquired: 05/29/2015 Comorbid Arrhythmia, Hypertension, Type II Weeks Of Treatment: 1 History: Diabetes, Neuropathy Clustered Wound: No Photos Photo Uploaded By: Alejandro Mulling on 06/15/2015 16:48:09 Wound Measurements Length: (cm) 4.3 Width: (cm) 12.8 Depth: (cm) 0.2 Area: (cm) 43.228 Volume: (cm) 8.646 % Reduction in Area: -32.7% % Reduction in Volume: -32.7% Epithelialization: None Tunneling: No Undermining: No Wound Description Full Thickness Without Exposed Foul Odor Classification: Support Structures Diabetic Severity Grade 1 (Wagner): Wound Margin: Distinct, outline attached Exudate Amount: Medium Exudate Type: Serosanguineous Exudate Color: red, brown After  Cleansing: No Wound Bed Granulation Amount: Small (1-33%) Exposed Structure Granulation Quality: Red, Pink Fascia Exposed: No Gerald, Shonte P. ([id number]) Necrotic Amount: Large (67-100%) Fat Layer Exposed:  No Necrotic Quality: Adherent Slough Tendon Exposed: No Muscle Exposed: No Joint Exposed: No Bone Exposed: No Limited to Skin Breakdown Periwound Skin Texture Texture Color No Abnormalities Noted: No No Abnormalities Noted: No Callus: No Atrophie Blanche: No Crepitus: No Cyanosis: No Excoriation: No Ecchymosis: No Fluctuance: No Erythema: No Friable: No Hemosiderin Staining: No Induration: No Mottled: No Localized Edema: No Pallor: No Rash: No Rubor: No Scarring: No Temperature / Pain Moisture Temperature: No Abnormality No Abnormalities Noted: No Dry / Scaly: No Maceration: No Moist: Yes Wound Preparation Ulcer Cleansing: Rinsed/Irrigated with Saline Topical Anesthetic Applied: Other: lidocaine 4%, Treatment Notes Wound #2 (Left, Distal Lower Leg) 1. Cleansed with: Clean wound with Normal Saline 2. Anesthetic Topical Lidocaine 4% cream to wound bed prior to debridement 4. Dressing Applied: Silvadene Cream 5. Secondary Dressing Applied Gauze and Kerlix/Conform Notes netting Electronic Signature(s) Signed: 06/15/2015 5:45:13 PM By: Alejandro Mulling Entered By: Alejandro Mulling on 06/15/2015 09:26:26 Raisch, [name] ([id number]) Vantrease, [name] ([id number]) -------------------------------------------------------------------------------- Wound Assessment Details Patient Name: Blanford, Doug P. Date of Service: 06/15/2015 9:00 AM Medical Record Number: [id number] Patient Account Number: [id number] Date of Birth/Sex: Nov 23, 1960 (54 y.o. Male) Treating RN: Phillis Haggis Primary Care Physician: Hillery Aldo Other Clinician: Referring Physician: Hillery Aldo Treating Physician/Extender: Rudene Re in Treatment: 1 Wound Status Wound Number: 3 Primary Diabetic Wound/Ulcer of the Lower Etiology: Extremity Wound Location: Left Toe Great Wound Open Wounding Event: Thermal Burn Status: Date Acquired: 05/29/2015 Comorbid Arrhythmia, Hypertension, Type  II Weeks Of Treatment: 1 History: Diabetes, Neuropathy Clustered Wound: No Photos Photo Uploaded By: Alejandro Mulling on 06/15/2015 16:48:28 Wound Measurements Length: (cm) 2.8 Width: (cm) 1.8 Depth: (cm) 0.1 Area: (cm) 3.958 Volume: (cm) 0.396 % Reduction in Area: 32.6% % Reduction in Volume: 32.5% Epithelialization: None Tunneling: No Undermining: No Wound Description Classification: Grade 1 Wound Margin: Distinct, outline attached Exudate Amount: Small Exudate Type: Serosanguineous Exudate Color: red, brown Foul Odor After Cleansing: No Wound Bed Granulation Amount: Small (1-33%) Exposed Structure Granulation Quality: Pink, Pale Fascia Exposed: No Necrotic Amount: Large (67-100%) Fat Layer Exposed: No Necrotic Quality: Adherent Slough Tendon Exposed: No Muela, Lexus P. ([id number]) Muscle Exposed: No Joint Exposed: No Bone Exposed: No Limited to Skin Breakdown Periwound Skin Texture Texture Color No Abnormalities Noted: No No Abnormalities Noted: No Callus: No Atrophie Blanche: No Crepitus: No Cyanosis: No Excoriation: No Ecchymosis: No Fluctuance: No Erythema: No Friable: No Hemosiderin Staining: No Induration: No Mottled: No Localized Edema: Yes Pallor: No Rash: No Rubor: No Scarring: No Temperature / Pain Moisture Temperature: No Abnormality No Abnormalities Noted: No Dry / Scaly: No Maceration: No Moist: Yes Wound Preparation Ulcer Cleansing: Rinsed/Irrigated with Saline Topical Anesthetic Applied: Other: lidocaine 4%, Treatment Notes Wound #3 (Left Toe Great) 1. Cleansed with: Clean wound with Normal Saline 2. Anesthetic Topical Lidocaine 4% cream to wound bed prior to debridement 4. Dressing Applied: Silvadene Cream 5. Secondary Dressing Applied Gauze and Kerlix/Conform Notes netting Electronic Signature(s) Signed: 06/15/2015 5:45:13 PM By: Alejandro Mulling Entered By: Alejandro Mulling on 06/15/2015 09:24:06 Kerwood, [name]  ([id number]) -------------------------------------------------------------------------------- Wound Assessment Details Patient Name: Huttner, Machael P. Date of Service: 06/15/2015 9:00 AM Medical Record Number: [id number] Patient Account Number: [id number] Date of Birth/Sex: 23-Jul-1960 (54 y.o. Male) Treating RN: Phillis Haggis Primary Care Physician: Hillery Aldo Other Clinician: Referring Physician: Hillery Aldo Treating Physician/Extender:  Britto, Errol Weeks in Treatment: 1 Wound Status Wound Number: 4 Primary Diabetic Wound/Ulcer of the Lower Etiology: Extremity Wound Location: Left Toe Second Wound Open Wounding Event: Thermal Burn Status: Date Acquired: 05/29/2015 Comorbid Arrhythmia, Hypertension, Type II Weeks Of Treatment: 1 History: Diabetes, Neuropathy Clustered Wound: No Photos Photo Uploaded By: Alejandro MullingPinkerton, Debra on 06/15/2015 16:48:28 Wound Measurements Length: (cm) 0.9 Width: (cm) 1.1 Depth: (cm) 0.1 Area: (cm) 0.778 Volume: (cm) 0.078 % Reduction in Area: 36.5% % Reduction in Volume: 36.6% Epithelialization: None Tunneling: No Undermining: No Wound Description Classification: Grade 1 Wound Margin: Distinct, outline attached Exudate Amount: Medium Foul Odor After Cleansing: No Wound Bed Granulation Amount: Small (1-33%) Exposed Structure Granulation Quality: Red Fascia Exposed: No Necrotic Amount: Large (67-100%) Fat Layer Exposed: No Necrotic Quality: Adherent Slough Tendon Exposed: No Muscle Exposed: No Joint Exposed: No Lefkowitz, Shia P. ([id number]) Bone Exposed: No Limited to Skin Breakdown Periwound Skin Texture Texture Color No Abnormalities Noted: No No Abnormalities Noted: No Callus: No Atrophie Blanche: No Crepitus: No Cyanosis: No Excoriation: No Ecchymosis: No Fluctuance: No Erythema: No Friable: No Hemosiderin Staining: No Induration: No Mottled: No Localized Edema: No Pallor: No Rash: No Rubor: No Scarring: No  Temperature / Pain Moisture Temperature: No Abnormality No Abnormalities Noted: No Dry / Scaly: No Maceration: No Moist: Yes Wound Preparation Ulcer Cleansing: Rinsed/Irrigated with Saline Topical Anesthetic Applied: Other: lidocaine 4%, Treatment Notes Wound #4 (Left Toe Second) 1. Cleansed with: Clean wound with Normal Saline 2. Anesthetic Topical Lidocaine 4% cream to wound bed prior to debridement 4. Dressing Applied: Silvadene Cream 5. Secondary Dressing Applied Gauze and Kerlix/Conform Notes netting Electronic Signature(s) Signed: 06/15/2015 5:45:13 PM By: Alejandro MullingPinkerton, Debra Entered By: Alejandro MullingPinkerton, Debra on 06/15/2015 09:24:39 Mizrahi, Laren EvertsJIM P. ([id number]) -------------------------------------------------------------------------------- Wound Assessment Details Patient Name: Ponciano, Markes P. Date of Service: 06/15/2015 9:00 AM Medical Record Number: [id number] Patient Account Number: [id number] Date of Birth/Sex: 1961/04/01 51(54 y.o. Male) Treating RN: Phillis HaggisPinkerton, Debi Primary Care Physician: Hillery AldoPATEL, SARAH Other Clinician: Referring Physician: Hillery AldoPATEL, SARAH Treating Physician/Extender: Rudene ReBritto, Errol Weeks in Treatment: 1 Wound Status Wound Number: 5 Primary Diabetic Wound/Ulcer of the Lower Etiology: Extremity Wound Location: Left Toe Third Wound Open Wounding Event: Thermal Burn Status: Date Acquired: 05/29/2015 Comorbid Arrhythmia, Hypertension, Type II Weeks Of Treatment: 1 History: Diabetes, Neuropathy Clustered Wound: No Photos Photo Uploaded By: Alejandro MullingPinkerton, Debra on 06/15/2015 16:48:54 Wound Measurements Length: (cm) 2.5 Width: (cm) 1.8 Depth: (cm) 0.1 Area: (cm) 3.534 Volume: (cm) 0.353 % Reduction in Area: 0% % Reduction in Volume: 0% Epithelialization: None Tunneling: No Undermining: No Wound Description Classification: Grade 1 Wound Margin: Distinct, outline attached Exudate Amount: Medium Exudate Type: Serosanguineous Exudate Color: red,  brown Foul Odor After Cleansing: No Wound Bed Granulation Amount: None Present (0%) Exposed Structure Necrotic Amount: Large (67-100%) Fascia Exposed: No Necrotic Quality: Adherent Slough Fat Layer Exposed: No Tendon Exposed: No Howze, Denney P. ([id number]) Muscle Exposed: No Joint Exposed: No Bone Exposed: No Limited to Skin Breakdown Periwound Skin Texture Texture Color No Abnormalities Noted: No No Abnormalities Noted: No Callus: No Atrophie Blanche: No Crepitus: No Cyanosis: No Excoriation: No Ecchymosis: No Fluctuance: No Erythema: No Friable: No Hemosiderin Staining: No Induration: No Mottled: No Localized Edema: No Pallor: No Rash: No Rubor: No Scarring: No Temperature / Pain Moisture Temperature: No Abnormality No Abnormalities Noted: No Dry / Scaly: No Maceration: No Moist: Yes Wound Preparation Ulcer Cleansing: Rinsed/Irrigated with Saline Topical Anesthetic Applied: Other: lidocaine 4%, Treatment Notes Wound #5 (Left Toe Third) 1. Cleansed with: Clean  wound with Normal Saline 2. Anesthetic Topical Lidocaine 4% cream to wound bed prior to debridement 4. Dressing Applied: Silvadene Cream 5. Secondary Dressing Applied Gauze and Kerlix/Conform Notes netting Electronic Signature(s) Signed: 06/15/2015 5:45:13 PM By: Alejandro Mulling Entered By: Alejandro Mulling on 06/15/2015 09:25:13 Degraffenreid, [name] ([id number]) -------------------------------------------------------------------------------- Wound Assessment Details Patient Name: Zaugg, [name] Date of Service: 06/15/2015 9:00 AM Medical Record Number: [id number] Patient Account Number: [id number] Date of Birth/Sex: [birth year] (54 y.o. Male) Treating RN: Phillis Haggis Primary Care Physician: Hillery Aldo Other Clinician: Referring Physician: Hillery Aldo Treating Physician/Extender: Rudene Re in Treatment: 1 Wound Status Wound Number: 6 Primary Diabetic Wound/Ulcer of the  Lower Etiology: Extremity Wound Location: Left Toe Fourth Wound Open Wounding Event: Thermal Burn Status: Date Acquired: 05/29/2015 Comorbid Arrhythmia, Hypertension, Type II Weeks Of Treatment: 1 History: Diabetes, Neuropathy Clustered Wound: No Photos Photo Uploaded By: Alejandro Mulling on 06/15/2015 16:48:54 Wound Measurements Length: (cm) 1.6 Width: (cm) 1 Depth: (cm) 0.1 Area: (cm) 1.257 Volume: (cm) 0.126 % Reduction in Area: 27.3% % Reduction in Volume: 27.2% Epithelialization: None Tunneling: No Undermining: No Wound Description Classification: Grade 1 Wound Margin: Distinct, outline attached Exudate Amount: Small Exudate Type: Serosanguineous Exudate Color: red, brown Foul Odor After Cleansing: No Wound Bed Granulation Amount: None Present (0%) Exposed Structure Necrotic Amount: Large (67-100%) Fascia Exposed: No Necrotic Quality: Eschar, Adherent Slough Fat Layer Exposed: No Tendon Exposed: No Teall, Densil P. ([id number]) Muscle Exposed: No Joint Exposed: No Bone Exposed: No Limited to Skin Breakdown Periwound Skin Texture Texture Color No Abnormalities Noted: No No Abnormalities Noted: No Callus: No Atrophie Blanche: No Crepitus: No Cyanosis: No Excoriation: No Ecchymosis: No Fluctuance: No Erythema: No Friable: No Hemosiderin Staining: No Induration: No Mottled: No Localized Edema: Yes Pallor: No Rash: No Rubor: No Scarring: No Temperature / Pain Moisture Temperature: No Abnormality No Abnormalities Noted: No Dry / Scaly: No Maceration: No Moist: Yes Wound Preparation Ulcer Cleansing: Rinsed/Irrigated with Saline Topical Anesthetic Applied: Other: lidocaine 4%, Treatment Notes Wound #6 (Left Toe Fourth) 1. Cleansed with: Clean wound with Normal Saline 2. Anesthetic Topical Lidocaine 4% cream to wound bed prior to debridement 4. Dressing Applied: Silvadene Cream 5. Secondary Dressing Applied Gauze and  Kerlix/Conform Notes netting Electronic Signature(s) Signed: 06/15/2015 5:45:13 PM By: Alejandro Mulling Entered By: Alejandro Mulling on 06/15/2015 09:27:01 Edelstein, [name] ([id number]) -------------------------------------------------------------------------------- Wound Assessment Details Patient Name: Kamp, Carnie P. Date of Service: 06/15/2015 9:00 AM Medical Record Number: [id number] Patient Account Number: [id number] Date of Birth/Sex: [birth year] (54 y.o. Male) Treating RN: Phillis Haggis Primary Care Physician: Hillery Aldo Other Clinician: Referring Physician: Hillery Aldo Treating Physician/Extender: Rudene Re in Treatment: 1 Wound Status Wound Number: 7 Primary Diabetic Wound/Ulcer of the Lower Etiology: Extremity Wound Location: Right Toe Second Wound Open Wounding Event: Thermal Burn Status: Date Acquired: 05/29/2015 Comorbid Arrhythmia, Hypertension, Type II Weeks Of Treatment: 1 History: Diabetes, Neuropathy Clustered Wound: No Photos Photo Uploaded By: Alejandro Mulling on 06/15/2015 16:49:16 Wound Measurements Length: (cm) 1.8 Width: (cm) 0.4 Depth: (cm) 0.1 Area: (cm) 0.565 Volume: (cm) 0.057 % Reduction in Area: 10% % Reduction in Volume: 9.5% Epithelialization: None Tunneling: No Undermining: No Wound Description Classification: Grade 1 Wound Margin: Distinct, outline attached Exudate Amount: None Present Foul Odor After Cleansing: No Wound Bed Granulation Amount: None Present (0%) Exposed Structure Necrotic Amount: Large (67-100%) Fascia Exposed: No Necrotic Quality: Eschar Fat Layer Exposed: No Tendon Exposed: No Muscle Exposed: No Joint Exposed: No Mcafee, Kimber P. ([id number]) Bone Exposed: No Limited  to Skin Breakdown Periwound Skin Texture Texture Color No Abnormalities Noted: No No Abnormalities Noted: No Callus: No Atrophie Blanche: No Crepitus: No Cyanosis: No Excoriation: No Ecchymosis: No Fluctuance: No Erythema:  No Friable: No Hemosiderin Staining: No Induration: No Mottled: No Localized Edema: No Pallor: No Rash: No Rubor: No Scarring: No Temperature / Pain Moisture Temperature: No Abnormality No Abnormalities Noted: No Dry / Scaly: Yes Maceration: No Moist: No Wound Preparation Ulcer Cleansing: Rinsed/Irrigated with Saline Topical Anesthetic Applied: Other: LIDOCAINE 4%, Treatment Notes Wound #7 (Right Toe Second) 1. Cleansed with: Clean wound with Normal Saline 2. Anesthetic Topical Lidocaine 4% cream to wound bed prior to debridement 4. Dressing Applied: Silvadene Cream 5. Secondary Dressing Applied Gauze and Kerlix/Conform Notes netting Electronic Signature(s) Signed: 06/15/2015 5:45:13 PM By: Alejandro Mulling Entered By: Alejandro Mulling on 06/15/2015 09:27:29 Sandate, [name] ([id number]) -------------------------------------------------------------------------------- Wound Assessment Details Patient Name: Morency, Keyontay P. Date of Service: 06/15/2015 9:00 AM Medical Record Number: [id number] Patient Account Number: [id number] Date of Birth/Sex: [birth year] (54 y.o. Male) Treating RN: Phillis Haggis Primary Care Physician: Hillery Aldo Other Clinician: Referring Physician: Hillery Aldo Treating Physician/Extender: Rudene Re in Treatment: 1 Wound Status Wound Number: 8 Primary Diabetic Wound/Ulcer of the Lower Etiology: Extremity Wound Location: Right Toe Third Wound Open Wounding Event: Thermal Burn Status: Date Acquired: 05/29/2015 Comorbid Arrhythmia, Hypertension, Type II Weeks Of Treatment: 1 History: Diabetes, Neuropathy Clustered Wound: No Photos Photo Uploaded By: Alejandro Mulling on 06/15/2015 16:49:17 Wound Measurements Length: (cm) 1.3 Width: (cm) 0.3 Depth: (cm) 0.1 Area: (cm) 0.306 Volume: (cm) 0.031 % Reduction in Area: -18.1% % Reduction in Volume: -19.2% Epithelialization: None Tunneling: No Undermining: No Wound  Description Classification: Grade 1 Wound Margin: Distinct, outline attached Exudate Amount: None Present Foul Odor After Cleansing: No Wound Bed Granulation Amount: None Present (0%) Exposed Structure Necrotic Amount: Large (67-100%) Fascia Exposed: No Necrotic Quality: Eschar Fat Layer Exposed: No Tendon Exposed: No Muscle Exposed: No Joint Exposed: No Orrego, Sudeep P. ([id number]) Bone Exposed: No Limited to Skin Breakdown Periwound Skin Texture Texture Color No Abnormalities Noted: No No Abnormalities Noted: No Callus: No Atrophie Blanche: No Crepitus: No Cyanosis: No Excoriation: No Ecchymosis: No Fluctuance: No Erythema: No Friable: No Hemosiderin Staining: No Induration: No Mottled: No Localized Edema: No Pallor: No Rash: No Rubor: No Scarring: No Temperature / Pain Moisture Temperature: No Abnormality No Abnormalities Noted: No Dry / Scaly: Yes Maceration: No Moist: No Wound Preparation Ulcer Cleansing: Rinsed/Irrigated with Saline Topical Anesthetic Applied: Other: LIDOCAINE 4%, Treatment Notes Wound #8 (Right Toe Third) 1. Cleansed with: Clean wound with Normal Saline 2. Anesthetic Topical Lidocaine 4% cream to wound bed prior to debridement 4. Dressing Applied: Silvadene Cream 5. Secondary Dressing Applied Gauze and Kerlix/Conform Notes netting Electronic Signature(s) Signed: 06/15/2015 5:45:13 PM By: Alejandro Mulling Entered By: Alejandro Mulling on 06/15/2015 09:27:53 Mysliwiec, [name] ([id number]) -------------------------------------------------------------------------------- Wound Assessment Details Patient Name: Ramthun, Nayden P. Date of Service: 06/15/2015 9:00 AM Medical Record Number: [id number] Patient Account Number: [id number] Date of Birth/Sex: 01/26/1961 (54 y.o. Male) Treating RN: Phillis Haggis Primary Care Physician: Hillery Aldo Other Clinician: Referring Physician: Hillery Aldo Treating Physician/Extender: Rudene Re  in Treatment: 1 Wound Status Wound Number: 9 Primary Diabetic Wound/Ulcer of the Lower Etiology: Extremity Wound Location: Right Toe Fourth Wound Open Wounding Event: Thermal Burn Status: Date Acquired: 05/28/2015 Comorbid Arrhythmia, Hypertension, Type II Weeks Of Treatment: 1 History: Diabetes, Neuropathy Clustered Wound: No Photos Photo Uploaded By: Alejandro Mulling on 06/15/2015 16:49:27 Wound Measurements Length: (  cm) 1.3 Width: (cm) 0.3 Depth: (cm) 0.1 Area: (cm) 0.306 Volume: (cm) 0.031 % Reduction in Area: 25% % Reduction in Volume: 24.4% Epithelialization: None Tunneling: No Undermining: No Wound Description Classification: Grade 1 Wound Margin: Distinct, outline attached Exudate Amount: None Present Foul Odor After Cleansing: No Wound Bed Granulation Amount: None Present (0%) Exposed Structure Necrotic Amount: Large (67-100%) Fascia Exposed: No Necrotic Quality: Eschar Fat Layer Exposed: No Tendon Exposed: No Muscle Exposed: No Joint Exposed: No Dilks, Jervon P. ([id number]) Bone Exposed: No Limited to Skin Breakdown Periwound Skin Texture Texture Color No Abnormalities Noted: No No Abnormalities Noted: No Callus: No Atrophie Blanche: No Crepitus: No Cyanosis: No Excoriation: No Ecchymosis: No Fluctuance: No Erythema: No Friable: No Hemosiderin Staining: No Induration: No Mottled: No Localized Edema: No Pallor: No Rash: No Rubor: No Scarring: No Temperature / Pain Moisture Temperature: No Abnormality No Abnormalities Noted: No Dry / Scaly: Yes Maceration: No Moist: No Wound Preparation Ulcer Cleansing: Rinsed/Irrigated with Saline Topical Anesthetic Applied: Other: lidocaine 4%, Treatment Notes Wound #9 (Right Toe Fourth) 1. Cleansed with: Clean wound with Normal Saline 2. Anesthetic Topical Lidocaine 4% cream to wound bed prior to debridement 4. Dressing Applied: Silvadene Cream 5. Secondary Dressing Applied Gauze and  Kerlix/Conform Notes netting Electronic Signature(s) Signed: 06/15/2015 5:45:13 PM By: Alejandro Mulling Entered By: Alejandro Mulling on 06/15/2015 09:28:15 Zelenak, [name] ([id number]) -------------------------------------------------------------------------------- Vitals Details Patient Name: Mullet, Breion P. Date of Service: 06/15/2015 9:00 AM Medical Record Number: [id number] Patient Account Number: [id number] Date of Birth/Sex: 10-09-1960 (54 y.o. Male) Treating RN: Phillis Haggis Primary Care Physician: Hillery Aldo Other Clinician: Referring Physician: Hillery Aldo Treating Physician/Extender: Rudene Re in Treatment: 1 Vital Signs Time Taken: 09:04 Temperature (F): 98.1 Height (in): 75 Pulse (bpm): 88 Weight (lbs): 321 Respiratory Rate (breaths/min): 20 Body Mass Index (BMI): 40.1 Blood Pressure (mmHg): 136/86 Reference Range: 80 - 120 mg / dl Electronic Signature(s) Signed: 06/15/2015 5:45:13 PM By: Alejandro Mulling Entered By: Alejandro Mulling on 06/15/2015 09:07:10

## 2015-06-22 ENCOUNTER — Encounter: Payer: Self-pay | Admitting: General Surgery

## 2015-06-22 ENCOUNTER — Encounter (HOSPITAL_BASED_OUTPATIENT_CLINIC_OR_DEPARTMENT_OTHER): Payer: Medicare Other | Admitting: General Surgery

## 2015-06-22 DIAGNOSIS — E11622 Type 2 diabetes mellitus with other skin ulcer: Secondary | ICD-10-CM | POA: Diagnosis not present

## 2015-06-22 DIAGNOSIS — L97821 Non-pressure chronic ulcer of other part of left lower leg limited to breakdown of skin: Secondary | ICD-10-CM

## 2015-06-22 NOTE — Progress Notes (Signed)
seeiheal 

## 2015-06-23 NOTE — Progress Notes (Signed)
GUINN, DELAROSA ([id number]) Visit Report for 06/22/2015 Chief Complaint Document Details Patient Name: [name], [name]. Date of Service: 06/22/2015 2:15 PM Medical Record Number: [id number] Patient Account Number: [id number] Date of Birth/Sex: 1960/09/24 (54 y.o. Male) Treating RN: Phillis Haggis Primary Care Physician: Hillery Aldo Other Clinician: Referring Physician: Hillery Aldo Treating Physician/Extender: Elayne Snare in Treatment: 2 Information Obtained from: Patient Chief Complaint Patient presents to the wound care center with burn wound(s)left lower extremity ankle and foot for one week Electronic Signature(s) Signed: 06/22/2015 2:53:44 PM By: Ardath Sax MD Entered By: Ardath Sax on 06/22/2015 14:53:44 [name], [name] ([id number]) -------------------------------------------------------------------------------- Debridement Details Patient Name: [name], [name] Date of Service: 06/22/2015 2:15 PM Medical Record Number: [id number] Patient Account Number: [id number] Date of Birth/Sex: 1961/03/16 (54 y.o. Male) Treating RN: Phillis Haggis Primary Care Physician: Hillery Aldo Other Clinician: Referring Physician: Hillery Aldo Treating Physician/Extender: Elayne Snare in Treatment: 2 Debridement Performed for Wound #4 Left Toe Second Assessment: Performed By: Physician Ardath Sax, MD Debridement: Debridement Pre-procedure Yes Verification/Time Out Taken: Start Time: 14:45 Pain Control: Other : lidocaine 4% cream Level: Skin/Subcutaneous Tissue Total Area Debrided (L x 0.7 (cm) x 1.5 (cm) = 1.05 (cm) W): Tissue and other Viable, Non-Viable, Exudate, Fibrin/Slough, Subcutaneous material debrided: Instrument: Forceps Bleeding: Minimum Hemostasis Achieved: Pressure End Time: 14:55 Procedural Pain: 0 Post Procedural Pain: 0 Response to Treatment: Procedure was tolerated well Post Debridement Measurements of Total Wound Length: (cm) 0.1 Width: (cm)  0.1 Depth: (cm) 0.1 Volume: (cm) 0.001 Post Procedure Diagnosis Same as Pre-procedure Electronic Signature(s) Signed: 06/22/2015 4:28:27 PM By: Alejandro Mulling Signed: 06/23/2015 8:01:58 AM By: Ardath Sax MD Entered By: Alejandro Mulling on 06/22/2015 14:52:56 [name], [name] ([id number]) -------------------------------------------------------------------------------- Debridement Details Patient Name: [name], [name] Date of Service: 06/22/2015 2:15 PM Medical Record Number: [id number] Patient Account Number: [id number] Date of Birth/Sex: 10-06-1960 (54 y.o. Male) Treating RN: Phillis Haggis Primary Care Physician: Hillery Aldo Other Clinician: Referring Physician: Hillery Aldo Treating Physician/Extender: Elayne Snare in Treatment: 2 Debridement Performed for Wound #5 Left Toe Third Assessment: Performed By: Physician Ardath Sax, MD Debridement: Debridement Pre-procedure Yes Verification/Time Out Taken: Start Time: 14:45 Pain Control: Other : lidocaine 4% cream Level: Skin/Subcutaneous Tissue Total Area Debrided (L x 1.6 (cm) x 1.8 (cm) = 2.88 (cm) W): Tissue and other Viable, Non-Viable, Exudate, Fibrin/Slough, Subcutaneous material debrided: Instrument: Forceps Bleeding: Minimum Hemostasis Achieved: Pressure End Time: 14:55 Procedural Pain: 0 Post Procedural Pain: 0 Response to Treatment: Procedure was tolerated well Post Debridement Measurements of Total Wound Length: (cm) 0.5 Width: (cm) 0.5 Depth: (cm) 0.1 Volume: (cm) 0.02 Post Procedure Diagnosis Same as Pre-procedure Electronic Signature(s) Signed: 06/22/2015 4:28:27 PM By: Alejandro Mulling Signed: 06/23/2015 8:01:58 AM By: Ardath Sax MD Entered By: Alejandro Mulling on 06/22/2015 14:54:04 [name], [name] ([id number]) -------------------------------------------------------------------------------- Debridement Details Patient Name: [name], [name] Date of Service: 06/22/2015 2:15  PM Medical Record Number: [id number] Patient Account Number: [id number] Date of Birth/Sex: 09/30/1960 (54 y.o. Male) Treating RN: Phillis Haggis Primary Care Physician: Hillery Aldo Other Clinician: Referring Physician: Hillery Aldo Treating Physician/Extender: Elayne Snare in Treatment: 2 Debridement Performed for Wound #1 Left,Proximal Lower Leg Assessment: Performed By: Physician Ardath Sax, MD Debridement: Debridement Pre-procedure Yes Verification/Time Out Taken: Start Time: 14:45 Pain Control: Other : lidocaine 4% cream Level: Skin/Subcutaneous Tissue Total Area Debrided (L x 1.9 (cm) x 3 (cm) = 5.7 (cm) W): Tissue and other Viable, Non-Viable, Exudate, Fibrin/Slough, Subcutaneous material debrided: Instrument: Forceps Bleeding: Minimum Hemostasis Achieved: Pressure  End Time: 14:55 Procedural Pain: 0 Post Procedural Pain: 0 Response to Treatment: Procedure was tolerated well Post Debridement Measurements of Total Wound Length: (cm) 0.1 Width: (cm) 0.1 Depth: (cm) 0.1 Volume: (cm) 0.001 Post Procedure Diagnosis Same as Pre-procedure Electronic Signature(s) Signed: 06/22/2015 4:28:27 PM By: Alejandro Mulling Signed: 06/23/2015 8:01:58 AM By: Ardath Sax MD Entered By: Alejandro Mulling on 06/22/2015 14:57:05 [name], [name] ([id number]) -------------------------------------------------------------------------------- Debridement Details Patient Name: [name], [name] Date of Service: 06/22/2015 2:15 PM Medical Record Number: [id number] Patient Account Number: [id number] Date of Birth/Sex: August 29, 1960 (54 y.o. Male) Treating RN: Phillis Haggis Primary Care Physician: Hillery Aldo Other Clinician: Referring Physician: Hillery Aldo Treating Physician/Extender: Elayne Snare in Treatment: 2 Debridement Performed for Wound #7 Right Toe Second Assessment: Performed By: Physician Ardath Sax, MD Debridement: Debridement Pre-procedure  Yes Verification/Time Out Taken: Start Time: 14:45 Pain Control: Other : lidocaine 4% cream Level: Skin/Subcutaneous Tissue Total Area Debrided (L x 2 (cm) x 0.6 (cm) = 1.2 (cm) W): Tissue and other Viable, Non-Viable, Exudate, Fibrin/Slough, Subcutaneous material debrided: Instrument: Forceps Bleeding: Minimum Hemostasis Achieved: Pressure End Time: 14:55 Procedural Pain: 0 Post Procedural Pain: 0 Response to Treatment: Procedure was tolerated well Post Debridement Measurements of Total Wound Length: (cm) 2 Width: (cm) 0.6 Depth: (cm) 0.2 Volume: (cm) 0.188 Post Procedure Diagnosis Same as Pre-procedure Electronic Signature(s) Signed: 06/22/2015 4:28:27 PM By: Alejandro Mulling Signed: 06/23/2015 8:01:58 AM By: Ardath Sax MD Entered By: Alejandro Mulling on 06/22/2015 14:58:01 [name], [name] ([id number]) -------------------------------------------------------------------------------- Debridement Details Patient Name: [name], [name] Date of Service: 06/22/2015 2:15 PM Medical Record Number: [id number] Patient Account Number: [id number] Date of Birth/Sex: 01/23/1961 (54 y.o. Male) Treating RN: Phillis Haggis Primary Care Physician: Hillery Aldo Other Clinician: Referring Physician: Hillery Aldo Treating Physician/Extender: Elayne Snare in Treatment: 2 Debridement Performed for Wound #9 Right Toe Fourth Assessment: Performed By: Physician Ardath Sax, MD Debridement: Debridement Pre-procedure Yes Verification/Time Out Taken: Start Time: 14:45 Pain Control: Other : lidocaine 4% cream Level: Skin/Subcutaneous Tissue Total Area Debrided (L x 1.3 (cm) x 0.4 (cm) = 0.52 (cm) W): Tissue and other Viable, Non-Viable, Exudate, Fibrin/Slough, Subcutaneous material debrided: Instrument: Forceps Bleeding: Minimum Hemostasis Achieved: Pressure End Time: 14:55 Procedural Pain: 0 Post Procedural Pain: 0 Response to Treatment: Procedure was tolerated  well Post Debridement Measurements of Total Wound Length: (cm) 1.3 Width: (cm) 0.4 Depth: (cm) 0.2 Volume: (cm) 0.082 Post Procedure Diagnosis Same as Pre-procedure Electronic Signature(s) Signed: 06/22/2015 4:28:27 PM By: Alejandro Mulling Signed: 06/23/2015 8:01:58 AM By: Ardath Sax MD Entered By: Alejandro Mulling on 06/22/2015 14:58:42 [name], [name] ([id number]) -------------------------------------------------------------------------------- HPI Details Patient Name: [name], [name] Date of Service: 06/22/2015 2:15 PM Medical Record Number: [id number] Patient Account Number: [id number] Date of Birth/Sex: Jan 13, 1961 (54 y.o. Male) Treating RN: Phillis Haggis Primary Care Physician: Hillery Aldo Other Clinician: Referring Physician: Hillery Aldo Treating Physician/Extender: Elayne Snare in Treatment: 2 History of Present Illness Location: left lower extremity ankle and foot Quality: Patient reports No Pain. Severity: Patient states wound are getting worse. Duration: Patient has had the wound for < 1 weeks prior to presenting for treatment Context: The wound occurred when the patient was having a pedicure and due to his neuropathy didn't feel the temperature of the hot water Modifying Factors: Other treatment(s) tried include:on Bactrim and Silvadene Associated Signs and Symptoms: Patient reports having increase swelling office left lower extremity.Marland Kitchen HPI Description: 54 year old gentleman who was referred to as for a burn of the left foot which she has  had for about a week. He apparently was getting a pedicure at a salon and they used water which was hot and the patient did not feel it because of his diabetic neuropathy. After he got home he developed blisters on the left foot which then opened up and continued to lose fluid. Past medical history is significant for diabetes mellitus, alcohol abuse, neuropathy, depression, COPD, CHF, hypertension, tracheal stenosis,  status post cardiac stent placement, history of PE. He has never been a smoker and as noted has been an alcoholic in the past but given up alcohol for the last 3 years. He was put on Bactrim, Silvadene and asked to go to either a burn center wound center. of note he has not had a significant discrepancy in the size of his left lower extremity and right lower extremity in the past. 06/15/2015 -- he had a left lower extremity venous Dopplers ultrasound done on 06/08/2015 -- IMPRESSION:1. No evidence of lower extremity deep vein thrombosis, LEFT. he still continues to have redness and swelling of his left lower extremity and has moderate amount of discharge. Electronic Signature(s) Signed: 06/22/2015 2:53:52 PM By: Ardath Sax MD Entered By: Ardath Sax on 06/22/2015 14:53:52 [name], [name] ([id number]) -------------------------------------------------------------------------------- Physical Exam Details Patient Name: [name], [name] Date of Service: 06/22/2015 2:15 PM Medical Record Number: [id number] Patient Account Number: [id number] Date of Birth/Sex: [birth year] (54 y.o. Male) Treating RN: Phillis Haggis Primary Care Physician: Hillery Aldo Other Clinician: Referring Physician: Hillery Aldo Treating Physician/Extender: Elayne Snare in Treatment: 2 Electronic Signature(s) Signed: 06/22/2015 2:54:01 PM By: Ardath Sax MD Entered By: Ardath Sax on 06/22/2015 14:54:00 [name], [name] ([id number]) -------------------------------------------------------------------------------- Physician Orders Details Patient Name: Hulsey, Abed P. Date of Service: 06/22/2015 2:15 PM Medical Record Number: [id number] Patient Account Number: [id number] Date of Birth/Sex: 1960-09-03 (54 y.o. Male) Treating RN: Phillis Haggis Primary Care Physician: Hillery Aldo Other Clinician: Referring Physician: Hillery Aldo Treating Physician/Extender: Elayne Snare in Treatment: 2 Verbal / Phone  Orders: Yes ClinicianAshok Cordia, Debi Read Back and Verified: Yes Diagnosis Coding ICD-10 Coding Code Description E11.622 Type 2 diabetes mellitus with other skin ulcer T25.222A Burn of second degree of left foot, initial encounter T25.292A Burn of second degree of multiple sites of left ankle and foot, initial encounter I82.4Y2 Acute embolism and thrombosis of unspecified deep veins of left proximal lower extremity Wound Cleansing Wound #1 Left,Proximal Lower Leg o Cleanse wound with mild soap and water o May Shower, gently pat wound dry prior to applying new dressing. o May shower with protection. Wound #10 Left,Lateral Lower Leg o Cleanse wound with mild soap and water o May Shower, gently pat wound dry prior to applying new dressing. o May shower with protection. Wound #2 Left,Distal Lower Leg o Cleanse wound with mild soap and water o May Shower, gently pat wound dry prior to applying new dressing. o May shower with protection. Wound #3 Left Toe Great o Cleanse wound with mild soap and water o May Shower, gently pat wound dry prior to applying new dressing. o May shower with protection. Wound #4 Left Toe Second o Cleanse wound with mild soap and water o May Shower, gently pat wound dry prior to applying new dressing. o May shower with protection. Wound #5 Left Toe Third o Cleanse wound with mild soap and water o May Shower, gently pat wound dry prior to applying new dressing. o May shower with protection. Scherzinger, Dimas P. ([id number]) Wound #6 Left Toe Fourth o Cleanse wound with  mild soap and water o May Shower, gently pat wound dry prior to applying new dressing. o May shower with protection. Wound #7 Right Toe Second o Cleanse wound with mild soap and water o May Shower, gently pat wound dry prior to applying new dressing. o May shower with protection. Wound #8 Right Toe Third o Cleanse wound with mild soap and  water o May Shower, gently pat wound dry prior to applying new dressing. o May shower with protection. Wound #9 Right Toe Fourth o Cleanse wound with mild soap and water o May Shower, gently pat wound dry prior to applying new dressing. o May shower with protection. Skin Barriers/Peri-Wound Care Wound #1 Left,Proximal Lower Leg o Moisturizing lotion Wound #10 Left,Lateral Lower Leg o Moisturizing lotion Wound #2 Left,Distal Lower Leg o Moisturizing lotion Wound #3 Left Toe Great o Moisturizing lotion Wound #4 Left Toe Second o Moisturizing lotion Wound #5 Left Toe Third o Moisturizing lotion Wound #6 Left Toe Fourth o Moisturizing lotion Wound #7 Right Toe Second o Moisturizing lotion Wound #8 Right Toe Third o Moisturizing lotion Scholes, Lamin P. ([id number]) Wound #9 Right Toe Fourth o Moisturizing lotion Primary Wound Dressing Wound #1 Left,Proximal Lower Leg o Silvadene Cream Wound #10 Left,Lateral Lower Leg o Silvadene Cream Wound #2 Left,Distal Lower Leg o Silvadene Cream Wound #3 Left Toe Great o Silvadene Cream Wound #4 Left Toe Second o Silvadene Cream Wound #5 Left Toe Third o Silvadene Cream Wound #6 Left Toe Fourth o Silvadene Cream Wound #7 Right Toe Second o Silvadene Cream Wound #8 Right Toe Third o Silvadene Cream Wound #9 Right Toe Fourth o Silvadene Cream Secondary Dressing Wound #1 Left,Proximal Lower Leg o Gauze and Kerlix/Conform Wound #10 Left,Lateral Lower Leg o Gauze and Kerlix/Conform Wound #2 Left,Distal Lower Leg o Gauze and Kerlix/Conform Wound #3 Left Toe Great o Gauze and Kerlix/Conform Wound #4 Left Toe Second Whiteman, Jsoeph P. ([id number]) o Gauze and Kerlix/Conform Wound #5 Left Toe Third o Gauze and Kerlix/Conform Wound #6 Left Toe Fourth o Gauze and Kerlix/Conform Wound #7 Right Toe Second o Gauze and Kerlix/Conform Wound #8 Right Toe Third o Gauze and  Kerlix/Conform Wound #9 Right Toe Fourth o Gauze and Kerlix/Conform Dressing Change Frequency Wound #1 Left,Proximal Lower Leg o Change dressing every day. Wound #10 Left,Lateral Lower Leg o Change dressing every day. Wound #2 Left,Distal Lower Leg o Change dressing every day. Wound #3 Left Toe Great o Change dressing every day. Wound #4 Left Toe Second o Change dressing every day. Wound #5 Left Toe Third o Change dressing every day. Wound #6 Left Toe Fourth o Change dressing every day. Wound #7 Right Toe Second o Change dressing every day. Wound #8 Right Toe Third o Change dressing every day. Wound #9 Right Toe Fourth o Change dressing every day. Follow-up Appointments Fay, [name] EvertsJIM P. ([id number]) Wound #1 Left,Proximal Lower Leg o Return Appointment in 1 week. Wound #10 Left,Lateral Lower Leg o Return Appointment in 1 week. Wound #2 Left,Distal Lower Leg o Return Appointment in 1 week. Wound #3 Left Toe Great o Return Appointment in 1 week. Wound #4 Left Toe Second o Return Appointment in 1 week. Wound #5 Left Toe Third o Return Appointment in 1 week. Wound #6 Left Toe Fourth o Return Appointment in 1 week. Wound #7 Right Toe Second o Return Appointment in 1 week. Wound #8 Right Toe Third o Return Appointment in 1 week. Wound #9 Right Toe Fourth o Return Appointment in 1 week. Additional Orders /  Instructions Wound #1 Left,Proximal Lower Leg o Other: - Patient to go to hospital ASAP for a scan to rule out DVT. Per MD instructions, if scan is positive, he is supposed to seek medical attention in the ER. Wound #10 Left,Lateral Lower Leg o Other: - Patient to go to hospital ASAP for a scan to rule out DVT. Per MD instructions, if scan is positive, he is supposed to seek medical attention in the ER. Wound #2 Left,Distal Lower Leg o Other: - Patient to go to hospital ASAP for a scan to rule out DVT. Per MD instructions,  if scan is positive, he is supposed to seek medical attention in the ER. Wound #3 Left Toe Great o Other: - Patient to go to hospital ASAP for a scan to rule out DVT. Per MD instructions, if scan is positive, he is supposed to seek medical attention in the ER. Wound #4 Left Toe Second Magwood, Fadel P. ([id number]) o Other: - Patient to go to hospital ASAP for a scan to rule out DVT. Per MD instructions, if scan is positive, he is supposed to seek medical attention in the ER. Wound #5 Left Toe Third o Other: - Patient to go to hospital ASAP for a scan to rule out DVT. Per MD instructions, if scan is positive, he is supposed to seek medical attention in the ER. Wound #6 Left Toe Fourth o Other: - Patient to go to hospital ASAP for a scan to rule out DVT. Per MD instructions, if scan is positive, he is supposed to seek medical attention in the ER. Wound #7 Right Toe Second o Other: - Patient to go to hospital ASAP for a scan to rule out DVT. Per MD instructions, if scan is positive, he is supposed to seek medical attention in the ER. Wound #8 Right Toe Third o Other: - Patient to go to hospital ASAP for a scan to rule out DVT. Per MD instructions, if scan is positive, he is supposed to seek medical attention in the ER. Wound #9 Right Toe Fourth o Other: - Patient to go to hospital ASAP for a scan to rule out DVT. Per MD instructions, if scan is positive, he is supposed to seek medical attention in the ER. Medications-please add to medication list. Wound #1 Left,Proximal Lower Leg o DominguezO. Antibiotics - Doxycycline Wound #10 Left,Lateral Lower Leg o DominguezO. Antibiotics - Doxycycline Wound #2 Left,Distal Lower Leg o DominguezO. Antibiotics - Doxycycline Wound #3 Left Toe Great o DominguezO. Antibiotics - Doxycycline Wound #4 Left Toe Second o DominguezO. Antibiotics - Doxycycline Wound #5 Left Toe Third o DominguezO. Antibiotics - Doxycycline Wound #6 Left Toe Fourth o DominguezO. Antibiotics -  Doxycycline Wound #7 Right Toe Second o DominguezO. Antibiotics - Doxycycline Wound #8 Right Toe Third Lyster, Tydarius P. ([id number]) o DominguezO. Antibiotics - Doxycycline Wound #9 Right Toe Fourth o DominguezO. Antibiotics - Doxycycline Electronic Signature(s) Signed: 06/22/2015 4:28:27 PM By: Alejandro Mulling Signed: 06/23/2015 8:01:58 AM By: Ardath Sax MD Entered By: Alejandro Mulling on 06/22/2015 15:16:32 Ojala, [name] ([id number]) -------------------------------------------------------------------------------- Problem List Details Patient Name: Acuna, Mutasim P. Date of Service: 06/22/2015 2:15 PM Medical Record Number: [id number] Patient Account Number: [id number] Date of Birth/Sex: 06/29/1960 (54 y.o. Male) Treating RN: Phillis Haggis Primary Care Physician: Hillery Aldo Other Clinician: Referring Physician: Hillery Aldo Treating Physician/Extender: Elayne Snare in Treatment: 2 Active Problems ICD-10 Encounter Code Description Active Date Diagnosis E11.622 Type 2 diabetes mellitus with other skin ulcer 06/08/2015 Yes T25.222A Burn of  second degree of left foot, initial encounter 06/08/2015 Yes T25.292A Burn of second degree of multiple sites of left ankle and 06/08/2015 Yes foot, initial encounter I82.4Y2 Acute embolism and thrombosis of unspecified deep veins 06/08/2015 Yes of left proximal lower extremity Inactive Problems Resolved Problems Electronic Signature(s) Signed: 06/22/2015 2:53:32 PM By: Ardath Sax MD Entered By: Ardath Sax on 06/22/2015 14:53:31 Fenstermaker, [name] ([id number]) -------------------------------------------------------------------------------- Progress Note Details Patient Name: Protzman, Eugenio P. Date of Service: 06/22/2015 2:15 PM Medical Record Number: [id number] Patient Account Number: [id number] Date of Birth/Sex: 1961-03-15 (54 y.o. Male) Treating RN: Phillis Haggis Primary Care Physician: Hillery Aldo Other Clinician: Referring Physician:  Hillery Aldo Treating Physician/Extender: Elayne Snare in Treatment: 2 Subjective Chief Complaint Information obtained from Patient Patient presents to the wound care center with burn wound(s)left lower extremity ankle and foot for one week History of Present Illness (HPI) The following HPI elements were documented for the patient's wound: Location: left lower extremity ankle and foot Quality: Patient reports No Pain. Severity: Patient states wound are getting worse. Duration: Patient has had the wound for < 1 weeks prior to presenting for treatment Context: The wound occurred when the patient was having a pedicure and due to his neuropathy didn't feel the temperature of the hot water Modifying Factors: Other treatment(s) tried include:on Bactrim and Silvadene Associated Signs and Symptoms: Patient reports having increase swelling office left lower extremity.. 54 year old gentleman who was referred to as for a burn of the left foot which she has had for about a week. He apparently was getting a pedicure at a salon and they used water which was hot and the patient did not feel it because of his diabetic neuropathy. After he got home he developed blisters on the left foot which then opened up and continued to lose fluid. Past medical history is significant for diabetes mellitus, alcohol abuse, neuropathy, depression, COPD, CHF, hypertension, tracheal stenosis, status post cardiac stent placement, history of PE. He has never been a smoker and as noted has been an alcoholic in the past but given up alcohol for the last 3 years. He was put on Bactrim, Silvadene and asked to go to either a burn center wound center. of note he has not had a significant discrepancy in the size of his left lower extremity and right lower extremity in the past. 06/15/2015 -- he had a left lower extremity venous Dopplers ultrasound done on 06/08/2015 -- IMPRESSION:1. No evidence of lower extremity deep vein  thrombosis, LEFT. he still continues to have redness and swelling of his left lower extremity and has moderate amount of discharge. Objective Dhingra, Emin P. ([id number]) Constitutional Vitals Time Taken: 2:19 PM, Height: 75 in, Weight: 321 lbs, BMI: 40.1, Temperature: 98.1 F, Pulse: 112 bpm, Respiratory Rate: 20 breaths/min, Blood Pressure: 122/86 mmHg, Pulse Oximetry: 96 %. Integumentary (Hair, Skin) Wound #1 status is Open. Original cause of wound was Thermal Burn. The wound is located on the Left,Proximal Lower Leg. The wound measures 4.8cm length x 12.2cm width x 0.1cm depth; 45.993cm^2 area and 4.599cm^3 volume. The wound is limited to skin breakdown. There is no tunneling or undermining noted. There is a medium amount of serosanguineous drainage noted. The wound margin is distinct with the outline attached to the wound base.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

## 2015-06-23 NOTE — Progress Notes (Signed)
DWANE, ANDRES ([id number]) Visit Report for 06/22/2015 Arrival Information Details Patient Name: [name], [name]. Date of Service: 06/22/2015 2:15 PM Medical Record Number: [id number] Patient Account Number: [id number] Date of Birth/Sex: 01/06/1961 (54 y.o. Male) Treating RN: Phillis Haggis Primary Care Physician: Hillery Aldo Other Clinician: Referring Physician: Hillery Aldo Treating Physician/Extender: Elayne Snare in Treatment: 2 Visit Information History Since Last Visit All ordered tests and consults were completed: No Patient Arrived: Ambulatory Added or deleted any medications: No Arrival Time: 14:18 Any new allergies or adverse reactions: No Accompanied By: self Had a fall or experienced change in No Transfer Assistance: None activities of daily living that may affect Patient Identification Verified: Yes risk of falls: Secondary Verification Process Yes Signs or symptoms of abuse/neglect since last No Completed: visito Patient Requires Transmission- No Hospitalized since last visit: No Based Precautions: Pain Present Now: No Patient Has Alerts: Yes Patient Alerts: Patient on Blood Thinner plavix, DM 2 ABI [name] BILATERAL >220 Electronic Signature(s) Signed: 06/22/2015 4:28:27 PM By: Alejandro Mulling Entered By: Alejandro Mulling on 06/22/2015 14:19:16 [name], [name] ([id number]) -------------------------------------------------------------------------------- Encounter Discharge Information Details Patient Name: [name], [name] Date of Service: 06/22/2015 2:15 PM Medical Record Number: [id number] Patient Account Number: [id number] Date of Birth/Sex: 03-May-1961 (54 y.o. Male) Treating RN: Phillis Haggis Primary Care Physician: Hillery Aldo Other Clinician: Referring Physician: Hillery Aldo Treating Physician/Extender: Elayne Snare in Treatment: 2 Encounter Discharge Information Items Discharge Pain Level: 0 Discharge Condition: Stable Ambulatory  Status: Ambulatory Discharge Destination: Home Private Transportation: Auto Accompanied By: self Schedule Follow-up Appointment: Yes Medication Reconciliation completed and Yes provided to Patient/Care Samael Blades: Clinical Summary of Care: Electronic Signature(s) Signed: 06/22/2015 4:28:27 PM By: Alejandro Mulling Previous Signature: 06/22/2015 2:56:24 PM Version By: Ardath Sax MD Entered By: Alejandro Mulling on 06/22/2015 15:17:35 [name] ([id number]) -------------------------------------------------------------------------------- Lower Extremity Assessment Details Patient Name: Holohan, [name] Date of Service: 06/22/2015 2:15 PM Medical Record Number: [id number] Patient Account Number: [id number] Date of Birth/Sex: 01-Jul-1960 (54 y.o. Male) Treating RN: Phillis Haggis Primary Care Physician: Hillery Aldo Other Clinician: Referring Physician: Hillery Aldo Treating Physician/Extender: Elayne Snare in Treatment: 2 Edema Assessment Assessed: [Left: No] [Right: No] [Left: Edema] [Right: :] Calf Left: Right: Point of Measurement: 37 cm From Medial Instep 53 cm 45.8 cm Ankle Left: Right: Point of Measurement: 12 cm From Medial Instep 34.7 cm 28.5 cm Vascular Assessment Pulses: Posterior Tibial Dorsalis Pedis Palpable: [Left:Yes] [Right:Yes] Extremity colors, hair growth, and conditions: Extremity Color: [Left:Red] [Right:Red] Hair Growth on Extremity: [Left:Yes] [Right:Yes] Temperature of Extremity: [Left:Warm] [Right:Warm] Capillary Refill: [Left:< 3 seconds] [Right:< 3 seconds] Toe Nail Assessment Left: Right: Thick: No No Discolored: No No Deformed: No No Improper Length and Hygiene: No No Electronic Signature(s) Signed: 06/22/2015 4:28:27 PM By: Alejandro Mulling Entered By: Alejandro Mulling on 06/22/2015 14:25:00 [name] ([id number]) -------------------------------------------------------------------------------- Multi Wound Chart  Details Patient Name: [name], [name] Date of Service: 06/22/2015 2:15 PM Medical Record Number: [id number] Patient Account Number: [id number] Date of Birth/Sex: [birth year] (54 y.o. Male) Treating RN: Phillis Haggis Primary Care Physician: Hillery Aldo Other Clinician: Referring Physician: Hillery Aldo Treating Physician/Extender: Elayne Snare in Treatment: 2 Vital Signs Height(in): 75 Pulse(bpm): 112 Weight(lbs): 321 Blood Pressure 122/86 (mmHg): Body Mass Index(BMI): 40 Temperature(F): 98.1 Respiratory Rate 20 (breaths/min): Photos: [1:No Photos] [10:No Photos] [2:No Photos] Wound Location: [1:Left Lower Leg - Proximal Left Lower Leg - Lateral Left Lower Leg - Distal] Wounding Event: [1:Thermal Burn] [10:Thermal Burn] [2:Thermal Burn] Primary Etiology: [1:2nd degree Burn] [  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] Comorbid History: [1:Arrhythmia, Hypertension, Arrhythmia, Hypertension, Arrhythmia, Hypertension, Type II Diabetes, Neuropathy] [10:Type II Diabetes, Neuropathy] [2:Type II Diabetes, Neuropathy] Date Acquired: [1:05/29/2015] [10:05/29/2015] [2:05/29/2015] Weeks of Treatment: [1:2] [10:2] [2:2] Wound Status: [1:Open] [10:Open] [2:Open] Measurements L x W x D 4.8x12.2x0.1 [10:4.8x1.8x0.1] [2:1.9x3x0.1] (cm) Area (cm) : [1:45.993] [10:6.786] [2:4.477] Volume (cm) : [1:4.599] [10:0.679] [2:0.448] % Reduction in Area: [1:-577.80%] [10:-15.20%] [2:86.30%] % Reduction in Volume: -577.30% [10:-15.30%] [2:93.10%] Classification: [1:Full Thickness Without Exposed Support Structures] [10:Grade 1] [2:Full Thickness Without Exposed Support Structures] HBO Classification: [1:Grade 1] [10:N/A] [2:Grade 1] Exudate Amount: [1:Medium] [10:Medium] [2:Medium] Exudate Type: [1:Serosanguineous] [10:Serosanguineous] [2:Serosanguineous] Exudate Color: [1:red, brown] [10:red, brown] [2:red, brown] Wound Margin: [1:Distinct, outline attached Distinct, outline  attached Distinct, outline attached] Granulation Amount: [1:Medium (34-66%)] [10:Large (67-100%)] [2:None Present (0%)] Granulation Quality: [1:Red, Pink] [10:Pink, Pale] [2:N/A] Necrotic Amount: [1:Medium (34-66%)] [10:Small (1-33%)] [2:Large (67-100%)] Necrotic Tissue: [1:Adherent Slough] [10:Adherent Slough] [2:Adherent Slough] Exposed Structures: Mccubbin, Advith P. ([id number]) Fascia: No Fascia: No Fascia: No Fat: No Fat: No Fat: No Tendon: No Tendon: No Tendon: No Muscle: No Muscle: No Muscle: No Joint: No Joint: No Joint: No Bone: No Bone: No Bone: No Limited to Skin Limited to Skin Limited to Skin Breakdown Breakdown Breakdown Epithelialization: None None None Periwound Skin Texture: Edema: Yes Edema: Yes Edema: No Excoriation: No Excoriation: No Excoriation: No Induration: No Induration: No Induration: No Callus: No Callus: No Callus: No Crepitus: No Crepitus: No Crepitus: No Fluctuance: No Fluctuance: No Fluctuance: No Friable: No Friable: No Friable: No Rash: No Rash: No Rash: No Scarring: No Scarring: No Scarring: No Periwound Skin Maceration: No Moist: Yes Moist: Yes Moisture: Moist: No Maceration: No Maceration: No Dry/Scaly: No Dry/Scaly: No Dry/Scaly: No Periwound Skin Color: Erythema: Yes Atrophie Blanche: No Atrophie Blanche: No Atrophie Blanche: No Cyanosis: No Cyanosis: No Cyanosis: No Ecchymosis: No Ecchymosis: No Ecchymosis: No Erythema: No Erythema: No Hemosiderin Staining: No Hemosiderin Staining: No Hemosiderin Staining: No Mottled: No Mottled: No Mottled: No Pallor: No Pallor: No Pallor: No Rubor: No Rubor: No Rubor: No Erythema Location: Circumferential N/A N/A Erythema Measurement: Marked N/A N/A Temperature: No Abnormality No Abnormality No Abnormality Tenderness on No No No Palpation: Wound Preparation: Ulcer Cleansing: Ulcer Cleansing: Ulcer Cleansing: Rinsed/Irrigated with Rinsed/Irrigated with  Rinsed/Irrigated with Saline Saline Saline Topical Anesthetic Topical Anesthetic Topical Anesthetic Applied: Other: lidocaine Applied: Other: lidocaine Applied: Other: lidocaine 4% 4% 4% Wound Number: Photos: No Photos No Photos No Photos Wound Location: Left Toe Great Left Toe Second Left Toe Third Wounding Event: Thermal Burn Thermal Burn Thermal Burn Primary Etiology: Diabetic Wound/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 ([id number]) Arrhythmia, Hypertension, Arrhythmia, Hypertension, Arrhythmia, Hypertension, Type II Diabetes, Type II Diabetes, Type II Diabetes, Neuropathy Neuropathy Neuropathy Date Acquired: 05/29/2015 05/29/2015 05/29/2015 Weeks of Treatment: Wound Status: Open Open Open Measurements L x W x D 2.7x1.4x0.1 0.7x1.5x0.1 1.6x1.8x0.1 (cm) Area (cm) : 2.969 0.825 2.262 Volume (cm) : 0.297 0.082 0.226 % Reduction in Area: 49.50% 32.70% 36.00% % Reduction in Volume: 49.40% 33.30% 36.00% Classification: Grade 1 Grade 1 Grade 1 HBO Classification: N/A N/A N/A Exudate Amount: Small Medium Medium Exudate Type: Serosanguineous N/A Serosanguineous Exudate Color: red, brown N/A red, brown Wound Margin: Distinct, outline attached Distinct, outline attached Distinct, outline attached Granulation Amount: Small (1-33%) None Present (0%) None Present (0%) Granulation Quality: Pink, Pale N/A N/A  Necrotic Amount: Large (67-100%) Large (67-100%) Large (67-100%) Necrotic Tissue: Adherent Slough Eschar Adherent Slough Exposed Structures: Fascia: No Fascia: No Fascia: No Fat: No Fat: No Fat: No Tendon: No Tendon: No Tendon: No Muscle: No Muscle: No Muscle: No Joint: No Joint: No Joint: No Bone: No Bone: No Bone: No Limited to Skin Limited to Skin Limited to Skin Breakdown Breakdown Breakdown Epithelialization: None None None Periwound Skin  Texture: Edema: Yes Edema: No Edema: No Excoriation: No Excoriation: No Excoriation: No Induration: No Induration: No Induration: No Callus: No Callus: No Callus: No Crepitus: No Crepitus: No Crepitus: No Fluctuance: No Fluctuance: No Fluctuance: No Friable: No Friable: No Friable: No Rash: No Rash: No Rash: No Scarring: No Scarring: No Scarring: No Periwound Skin Moist: Yes Moist: Yes Moist: Yes Moisture: Maceration: No Maceration: No Maceration: No Dry/Scaly: No Dry/Scaly: No Dry/Scaly: No Periwound Skin Color: Atrophie Blanche: No Atrophie Blanche: No Atrophie Blanche: No Cyanosis: No Cyanosis: No Cyanosis: No Ecchymosis: No Ecchymosis: No Ecchymosis: No Erythema: No Erythema: No Erythema: No Hemosiderin Staining: No Hemosiderin Staining: No Hemosiderin Staining: No Mottled: No Mottled: No Mottled: No Reaser, Wendel P. ([id number]) Pallor: No Pallor: No Pallor: No Rubor: No Rubor: No Rubor: No Erythema Location: N/A N/A N/A Erythema Measurement: N/A N/A N/A Temperature: No Abnormality No Abnormality No Abnormality Tenderness on No No No Palpation: Wound Preparation: Ulcer Cleansing: Ulcer Cleansing: Ulcer Cleansing: Rinsed/Irrigated with Rinsed/Irrigated with Rinsed/Irrigated with Saline Saline Saline Topical Anesthetic Topical Anesthetic Topical Anesthetic Applied: Other: lidocaine Applied: Other: lidocaine Applied: Other: lidocaine 4% 4% 4% Wound Number: [id number]  Photos: No Photos No Photos No Photos Wound Location: Left Toe Fourth Right Toe Second Right Toe Third Wounding Event: Thermal Burn Thermal Burn Thermal Burn Primary Etiology: Diabetic Wound/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 the Lower Extremity the Lower Extremity Comorbid History: Arrhythmia, Hypertension, Arrhythmia, Hypertension, Arrhythmia, Hypertension, Type II Diabetes, Type II Diabetes, Type II Diabetes, Neuropathy Neuropathy  Neuropathy Date Acquired: 05/29/2015 05/29/2015 05/29/2015 Weeks of Treatment: 2 2 2  Wound Status: Open Open Open Measurements L x W x D 0.5x0.5x0.1 2x0.6x0.1 0.3x0.3x0.1 (cm) Area (cm) : 0.196 0.942 0.071 Volume (cm) : 0.02 0.094 0.007 % Reduction in Area: 88.70% -50.00% 72.60% % Reduction in Volume: 88.40% -49.20% 73.10% Classification: Grade 1 Grade 1 Grade 1 HBO Classification: N/A N/A N/A Exudate Amount: Small None Present None Present Exudate Type: Serosanguineous N/A N/A Exudate Color: red, brown N/A N/A Wound Margin: Distinct, outline attached Distinct, outline attached Distinct, outline attached Granulation Amount: None Present (0%) None Present (0%) None Present (0%) Granulation Quality: N/A N/A N/A Necrotic Amount: Large (67-100%) Large (67-100%) Large (67-100%) Necrotic Tissue: Eschar, Adherent Slough Eschar Eschar Exposed Structures: Fascia: No Fascia: No Fascia: No Fat: No Fat: No Fat: No Tendon: No Tendon: No Tendon: No Muscle: No Muscle: No Muscle: No Joint: No Joint: No Joint: No Bone: No Bone: No Bone: No Egolf, Neco P. ([id number]) Limited to Skin Limited to Skin Limited to Skin Breakdown Breakdown Breakdown Epithelialization: None None None Periwound Skin Texture: Edema: Yes Edema: No Edema: No Excoriation: No Excoriation: No Excoriation: No Induration: No Induration: No Induration: No Callus: No Callus: No Callus: No Crepitus: No Crepitus: No Crepitus: No Fluctuance: No Fluctuance: No Fluctuance: No Friable: No Friable: No Friable: No Rash: No Rash: No Rash: No Scarring: No Scarring: No Scarring: No Periwound Skin Moist: Yes Dry/Scaly: Yes Dry/Scaly: Yes Moisture: Maceration: No Maceration: No Maceration: No Dry/Scaly: No Moist: No Moist: No Periwound  Skin Color: Atrophie Blanche: No Atrophie Blanche: No Atrophie Blanche: No Cyanosis: No Cyanosis: No Cyanosis: No Ecchymosis: No Ecchymosis: No Ecchymosis:  No Erythema: No Erythema: No Erythema: No Hemosiderin Staining: No Hemosiderin Staining: No Hemosiderin Staining: No Mottled: No Mottled: No Mottled: No Pallor: No Pallor: No Pallor: No Rubor: No Rubor: No Rubor: No Erythema Location: N/A N/A N/A Erythema Measurement: N/A N/A N/A Temperature: No Abnormality No Abnormality No Abnormality Tenderness on No No No Palpation: Wound Preparation: Ulcer Cleansing: Ulcer Cleansing: Ulcer Cleansing: Rinsed/Irrigated with Rinsed/Irrigated with Rinsed/Irrigated with Saline Saline Saline Topical Anesthetic Topical Anesthetic Topical Anesthetic Applied: Other: lidocaine Applied: Other: Applied: Other: 4% LIDOCAINE 4% LIDOCAINE 4% Wound Number: 9 N/A N/A Photos: No Photos N/A N/A Wound Location: Right Toe Fourth N/A N/A Wounding Event: Thermal Burn N/A N/A Primary Etiology: Diabetic Wound/Ulcer of N/A N/A the Lower Extremity Comorbid History: Arrhythmia, Hypertension, N/A N/A Type II Diabetes, Neuropathy Date Acquired: 05/28/2015 N/A N/A Weeks of Treatment: 2 N/A N/A Wound Status: Open N/A N/A Measurements L x W x D 1.3x0.4x0.1 N/A N/A (cm) Fargnoli, Shia P. ([id number]) Area (cm) : 0.408 N/A N/A Volume (cm) : 0.041 N/A N/A % Reduction in Area: 0.00% N/A N/A % Reduction in Volume: 0.00% N/A N/A Classification: Grade 1 N/A N/A HBO Classification: N/A N/A N/A Exudate Amount: None Present N/A N/A Exudate Type: N/A N/A N/A Exudate Color: N/A N/A N/A Wound Margin: Distinct, outline attached N/A N/A Granulation Amount: None Present (0%) N/A N/A Granulation Quality: N/A N/A N/A Necrotic Amount: Large (67-100%) N/A N/A Necrotic Tissue: Eschar N/A N/A Exposed Structures: Fascia: No N/A N/A Fat: No Tendon: No Muscle: No Joint: No Bone: No Limited to Skin Breakdown Epithelialization: None N/A N/A Periwound Skin Texture: Edema: No N/A N/A Excoriation: No Induration: No Callus: No Crepitus: No Fluctuance: No Friable:  No Rash: No Scarring: No Periwound Skin Dry/Scaly: Yes N/A N/A Moisture: Maceration: No Moist: No Periwound Skin Color: Atrophie Blanche: No N/A N/A Cyanosis: No Ecchymosis: No Erythema: No Hemosiderin Staining: No Mottled: No Pallor: No Rubor: No Erythema Location: N/A N/A N/A Erythema Measurement: N/A N/A N/A Temperature: No Abnormality N/A N/A Tenderness on No N/A N/A Palpation: Wound Preparation: Ulcer Cleansing: N/A N/A Rinsed/Irrigated with Dutkiewicz, Harce P. ([id number]) Saline Topical Anesthetic Applied: Other: lidocaine 4% Treatment Notes Electronic Signature(s) Signed: 06/22/2015 4:28:27 PM By: Alejandro Mulling Entered By: Alejandro Mulling on 06/22/2015 14:32:13 Spainhower, [name] ([id number]) -------------------------------------------------------------------------------- Multi-Disciplinary Care Plan Details Patient Name: [name], [name] Date of Service: 06/22/2015 2:15 PM Medical Record Number: [id number] Patient Account Number: [id number] Date of Birth/Sex: 04-15-1961 (54 y.o. Male) Treating RN: Phillis Haggis Primary Care Physician: Hillery Aldo Other Clinician: Referring Physician: Hillery Aldo Treating Physician/Extender: Elayne Snare in Treatment: 2 Active Inactive Orientation to the Wound Care Program Nursing Diagnoses: Knowledge deficit related to the wound healing center program Goals: Patient/caregiver will verbalize understanding of the Wound Healing Center Program Date Initiated: 06/08/2015 Goal Status: Active Interventions: Provide education on orientation to the wound center Notes: Wound/Skin Impairment Nursing Diagnoses: Impaired tissue integrity Knowledge deficit related to ulceration/compromised skin integrity Goals: Patient/caregiver will verbalize understanding of skin care regimen Date Initiated: 06/08/2015 Goal Status: Active Ulcer/skin breakdown will have a volume reduction of 30% by week 4 Date Initiated: 06/08/2015 Goal  Status: Active Ulcer/skin breakdown will have a volume reduction of 50% by week 8 Date Initiated: 06/08/2015 Goal Status: Active Ulcer/skin breakdown will have a volume reduction of 80% by week 12 Date Initiated: 06/08/2015 Goal Status: Active Ulcer/skin breakdown  will heal within 14 weeks Date Initiated: 06/08/2015 [name], [name] ([id number]) Goal Status: Active Interventions: Assess patient/caregiver ability to obtain necessary supplies Assess patient/caregiver ability to perform ulcer/skin care regimen upon admission and as needed Assess ulceration(s) every visit Provide education on smoking Provide education on ulcer and skin care Notes: Electronic Signature(s) Signed: 06/22/2015 4:28:27 PM By: Alejandro Mulling Entered By: Alejandro Mulling on 06/22/2015 14:31:58 Georgi, [name] ([id number]) -------------------------------------------------------------------------------- Pain Assessment Details Patient Name: [name], [name] Date of Service: 06/22/2015 2:15 PM Medical Record Number: [id number] Patient Account Number: [id number] Date of Birth/Sex: 1960-09-30 (54 y.o. Male) Treating RN: Phillis Haggis Primary Care Physician: Hillery Aldo Other Clinician: Referring Physician: Hillery Aldo Treating Physician/Extender: Elayne Snare in Treatment: 2 Active Problems Location of Pain Severity and Description of Pain Patient Has Paino No Site Locations Pain Management and Medication Current Pain Management: Electronic Signature(s) Signed: 06/22/2015 4:28:27 PM By: Alejandro Mulling Entered By: Alejandro Mulling on 06/22/2015 14:19:23 Salvaggio, [name] ([id number]) -------------------------------------------------------------------------------- Patient/Caregiver Education Details Patient Name: [name] Clark. Date of Service: 06/22/2015 2:15 PM Medical Record Number: [id number] Patient Account Number: [id number] Date of Birth/Gender: [birth year] (54 y.o. Male) Treating RN: Phillis Haggis Primary Care Physician: Hillery Aldo Other Clinician: Referring Physician: Hillery Aldo Treating Physician/Extender: Elayne Snare in Treatment: 2 Education Assessment Education Provided To: Patient Education Topics Provided Wound/Skin Impairment: Handouts: Other: change dressings as ordered Methods: Demonstration, Explain/Verbal Responses: State content correctly Electronic Signature(s) Signed: 06/22/2015 4:28:27 PM By: Alejandro Mulling Previous Signature: 06/22/2015 2:56:39 PM Version By: Ardath Sax MD Entered By: Alejandro Mulling on 06/22/2015 15:17:52 Dolman, [name] ([id number]) -------------------------------------------------------------------------------- Wound Assessment Details Patient Name: [name], [name] Date of Service: 06/22/2015 2:15 PM Medical Record Number: [id number] Patient Account Number: [id number] Date of Birth/Sex: [birth year] (54 y.o. Male) Treating RN: Phillis Haggis Primary Care Physician: Hillery Aldo Other Clinician: Referring Physician: Hillery Aldo Treating Physician/Extender: Elayne Snare in Treatment: 2 Wound Status Wound Number: 1 Primary 2nd degree Burn Etiology: Wound Location: Left, Proximal Lower Leg Wound Open Wounding Event: Thermal Burn Status: Date Acquired: 05/29/2015 Comorbid Arrhythmia, Hypertension, Type II Weeks Of Treatment: 2 History: Diabetes, Neuropathy Clustered Wound: No Photos Photo Uploaded By: Alejandro Mulling on 06/22/2015 16:14:50 Wound Measurements Length: (cm) 1.9 Width: (cm) 3 Depth: (cm) 0.1 Area: (cm) 4.477 Volume: (cm) 0.448 % Reduction in Area: 34% % Reduction in Volume: 34% Epithelialization: None Tunneling: No Undermining: No Wound Description Full Thickness Without Exposed Foul Odor Classification: Support Structures Diabetic Severity Grade 1 (Wagner): Wound Margin: Distinct, outline attached Exudate Amount: Medium Exudate Type: Serosanguineous Exudate Color: red,  brown After Cleansing: No Wound Bed Granulation Amount: Medium (34-66%) Exposed Structure Granulation Quality: Red, Pink Fascia Exposed: No Steinhauser, Donovin P. ([id number]) Necrotic Amount: Medium (34-66%) Fat Layer Exposed: No Necrotic Quality: Adherent Slough Tendon Exposed: No Muscle Exposed: No Joint Exposed: No Bone Exposed: No Limited to Skin Breakdown Periwound Skin Texture Texture Color No Abnormalities Noted: No No Abnormalities Noted: No Callus: No Atrophie Blanche: No Crepitus: No Cyanosis: No Excoriation: No Ecchymosis: No Fluctuance: No Erythema: Yes Friable: No Erythema Location: Circumferential Induration: No Erythema Measurement: Marked Localized Edema: Yes Hemosiderin Staining: No Rash: No Mottled: No Scarring: No Pallor: No Rubor: No Moisture No Abnormalities Noted: No Temperature / Pain Dry / Scaly: No Temperature: No Abnormality Maceration: No Moist: No Wound Preparation Ulcer Cleansing: Rinsed/Irrigated with Saline Topical Anesthetic Applied: Other: lidocaine 4%, Treatment Notes Wound #1 (Left, Proximal Lower Leg) 1. Cleansed with: Clean wound with Normal Saline 2. Anesthetic  Topical Lidocaine 4% cream to wound bed prior to debridement 4. Dressing Applied: Silvadene Cream 5. Secondary Dressing Applied Gauze and Kerlix/Conform Electronic Signature(s) Signed: 06/22/2015 4:28:27 PM By: Alejandro MullingPinkerton, Debra Entered By: Alejandro MullingPinkerton, Debra on 06/22/2015 14:56:19 Weingarten, [name] EvertsJIM P. ([id number]) -------------------------------------------------------------------------------- Wound Assessment Details Patient Name: Bara, Nainoa P. Date of Service: 06/22/2015 2:15 PM Medical Record Number: [id number] Patient Account Number: [id number] Date of Birth/Sex: 1961/04/13 47(54 y.o. Male) Treating RN: Phillis HaggisPinkerton, Debi Primary Care Physician: Hillery AldoPATEL, SARAH Other Clinician: Referring Physician: Hillery AldoPATEL, SARAH Treating Physician/Extender: Elayne SnarePARKER, PETER Weeks in Treatment:  2 Wound Status Wound Number: 10 Primary Diabetic Wound/Ulcer of the Lower Etiology: Extremity Wound Location: Left Lower Leg - Lateral Wound Open Wounding Event: Thermal Burn Status: Date Acquired: 05/29/2015 Comorbid Arrhythmia, Hypertension, Type II Weeks Of Treatment: 2 History: Diabetes, Neuropathy Clustered Wound: No Photos Photo Uploaded By: Alejandro MullingPinkerton, Debra on 06/22/2015 16:15:12 Wound Measurements Length: (cm) 4.8 Width: (cm) 1.8 Depth: (cm) 0.1 Area: (cm) 6.786 Volume: (cm) 0.679 % Reduction in Area: -15.2% % Reduction in Volume: -15.3% Epithelialization: None Tunneling: No Undermining: No Wound Description Classification: Grade 1 Wound Margin: Distinct, outline attached Exudate Amount: Medium Exudate Type: Serosanguineous Exudate Color: red, brown Foul Odor After Cleansing: No Wound Bed Granulation Amount: Large (67-100%) Exposed Structure Granulation Quality: Pink, Pale Fascia Exposed: No Necrotic Amount: Small (1-33%) Fat Layer Exposed: No Necrotic Quality: Adherent Slough Tendon Exposed: No Pevehouse, Keaston P. ([id number]) Muscle Exposed: No Joint Exposed: No Bone Exposed: No Limited to Skin Breakdown Periwound Skin Texture Texture Color No Abnormalities Noted: No No Abnormalities Noted: No Callus: No Atrophie Blanche: No Crepitus: No Cyanosis: No Excoriation: No Ecchymosis: No Fluctuance: No Erythema: No Friable: No Hemosiderin Staining: No Induration: No Mottled: No Localized Edema: Yes Pallor: No Rash: No Rubor: No Scarring: No Temperature / Pain Moisture Temperature: No Abnormality No Abnormalities Noted: No Dry / Scaly: No Maceration: No Moist: Yes Wound Preparation Ulcer Cleansing: Rinsed/Irrigated with Saline Topical Anesthetic Applied: Other: lidocaine 4%, Treatment Notes Wound #10 (Left, Lateral Lower Leg) 1. Cleansed with: Clean wound with Normal Saline 2. Anesthetic Topical Lidocaine 4% cream to wound bed prior to  debridement 4. Dressing Applied: Silvadene Cream 5. Secondary Dressing Applied Gauze and Kerlix/Conform Electronic Signature(s) Signed: 06/22/2015 4:28:27 PM By: Alejandro MullingPinkerton, Debra Entered By: Alejandro MullingPinkerton, Debra on 06/22/2015 14:30:36 Backer, [name] EvertsJIM P. ([id number]) -------------------------------------------------------------------------------- Wound Assessment Details Patient Name: Burnsworth, Kipper P. Date of Service: 06/22/2015 2:15 PM Medical Record Number: [id number] Patient Account Number: [id number] Date of Birth/Sex: 1961/04/13 [age] y.o. Male) Treating RN: Phillis HaggisPinkerton, Debi Primary Care Physician: Hillery AldoPATEL, SARAH Other Clinician: Referring Physician: Hillery AldoPATEL, SARAH Treating Physician/Extender: Elayne SnarePARKER, PETER Weeks in Treatment: 2 Wound Status Wound Number: 2 Primary 2nd degree Burn Etiology: Wound Location: Left, Distal Lower Leg Wound Open Wounding Event: Thermal Burn Status: Date Acquired: 05/29/2015 Comorbid Arrhythmia, Hypertension, Type II Weeks Of Treatment: 2 History: Diabetes, Neuropathy Clustered Wound: No Photos Photo Uploaded By: Alejandro MullingPinkerton, Debra on 06/22/2015 16:15:27 Wound Measurements Length: (cm) 4.8 Width: (cm) 12.2 Depth: (cm) 0.1 Area: (cm) 45.993 Volume: (cm) 4.599 % Reduction in Area: -41.2% % Reduction in Volume: 29.4% Epithelialization: None Tunneling: No Undermining: No Wound Description Full Thickness Without Exposed Foul Odor Classification: Support Structures Diabetic Severity Grade 1 (Wagner): Wound Margin: Distinct, outline attached Exudate Amount: Medium Exudate Type: Serosanguineous Exudate Color: red, brown After Cleansing: No Wound Bed Granulation Amount: None Present (0%) Exposed Structure Necrotic Amount: Large (67-100%) Fascia Exposed: No Schrier, Gleb P. ([id number]) Necrotic Quality: Adherent Slough Fat Layer Exposed: No Tendon Exposed: No Muscle  Exposed: No Joint Exposed: No Bone Exposed: No Limited to Skin Breakdown Periwound  Skin Texture Texture Color No Abnormalities Noted: No No Abnormalities Noted: No Callus: No Atrophie Blanche: No Crepitus: No Cyanosis: No Excoriation: No Ecchymosis: No Fluctuance: No Erythema: No Friable: No Hemosiderin Staining: No Induration: No Mottled: No Localized Edema: No Pallor: No Rash: No Rubor: No Scarring: No Temperature / Pain Moisture Temperature: No Abnormality No Abnormalities Noted: No Dry / Scaly: No Maceration: No Moist: Yes Wound Preparation Ulcer Cleansing: Rinsed/Irrigated with Saline Topical Anesthetic Applied: Other: lidocaine 4%, Treatment Notes Wound #2 (Left, Distal Lower Leg) 1. Cleansed with: Clean wound with Normal Saline 2. Anesthetic Topical Lidocaine 4% cream to wound bed prior to debridement 4. Dressing Applied: Silvadene Cream 5. Secondary Dressing Applied Gauze and Kerlix/Conform Electronic Signature(s) Signed: 06/22/2015 4:28:27 PM By: Alejandro Mulling Entered By: Alejandro Mulling on 06/22/2015 14:56:02 [name], [name] ([id number]) -------------------------------------------------------------------------------- Wound Assessment Details Patient Name: Stabler, Olie P. Date of Service: 06/22/2015 2:15 PM Medical Record Number: [id number] Patient Account Number: [id number] Date of Birth/Sex: 1960-12-27 (54 y.o. Male) Treating RN: Phillis Haggis Primary Care Physician: Hillery Aldo Other Clinician: Referring Physician: Hillery Aldo Treating Physician/Extender: Elayne Snare in Treatment: 2 Wound Status Wound Number: 3 Primary Diabetic Wound/Ulcer of the Lower Etiology: Extremity Wound Location: Left Toe Great Wound Open Wounding Event: Thermal Burn Status: Date Acquired: 05/29/2015 Comorbid Arrhythmia, Hypertension, Type II Weeks Of Treatment: 2 History: Diabetes, Neuropathy Clustered Wound: No Photos Photo Uploaded By: Alejandro Mulling on 06/22/2015 16:16:00 Wound Measurements Length: (cm) 2.7 Width: (cm)  1.4 Depth: (cm) 0.1 Area: (cm) 2.969 Volume: (cm) 0.297 % Reduction in Area: 49.5% % Reduction in Volume: 49.4% Epithelialization: None Tunneling: No Undermining: No Wound Description Classification: Grade 1 Wound Margin: Distinct, outline attached Exudate Amount: Small Exudate Type: Serosanguineous Exudate Color: red, brown Foul Odor After Cleansing: No Wound Bed Granulation Amount: Small (1-33%) Exposed Structure Granulation Quality: Pink, Pale Fascia Exposed: No Necrotic Amount: Large (67-100%) Fat Layer Exposed: No Necrotic Quality: Adherent Slough Tendon Exposed: No Bhavsar, Lacharles P. ([id number]) Muscle Exposed: No Joint Exposed: No Bone Exposed: No Limited to Skin Breakdown Periwound Skin Texture Texture Color No Abnormalities Noted: No No Abnormalities Noted: No Callus: No Atrophie Blanche: No Crepitus: No Cyanosis: No Excoriation: No Ecchymosis: No Fluctuance: No Erythema: No Friable: No Hemosiderin Staining: No Induration: No Mottled: No Localized Edema: Yes Pallor: No Rash: No Rubor: No Scarring: No Temperature / Pain Moisture Temperature: No Abnormality No Abnormalities Noted: No Dry / Scaly: No Maceration: No Moist: Yes Wound Preparation Ulcer Cleansing: Rinsed/Irrigated with Saline Topical Anesthetic Applied: Other: lidocaine 4%, Treatment Notes Wound #3 (Left Toe Great) 1. Cleansed with: Clean wound with Normal Saline 2. Anesthetic Topical Lidocaine 4% cream to wound bed prior to debridement 4. Dressing Applied: Silvadene Cream 5. Secondary Dressing Applied Gauze and Kerlix/Conform Electronic Signature(s) Signed: 06/22/2015 4:28:27 PM By: Alejandro Mulling Entered By: Alejandro Mulling on 06/22/2015 14:29:06 [name], [name] ([id number]) -------------------------------------------------------------------------------- Wound Assessment Details Patient Name: Kass, Matteo P. Date of Service: 06/22/2015 2:15 PM Medical Record Number:  [id number] Patient Account Number: [id number] Date of Birth/Sex: [birth year] (54 y.o. Male) Treating RN: Phillis Haggis Primary Care Physician: Hillery Aldo Other Clinician: Referring Physician: Hillery Aldo Treating Physician/Extender: Elayne Snare in Treatment: 2 Wound Status Wound Number: 4 Primary Diabetic Wound/Ulcer of the Lower Etiology: Extremity Wound Location: Left Toe Second Wound Open Wounding Event: Thermal Burn Status: Date Acquired: 05/29/2015 Comorbid Arrhythmia, Hypertension, Type II Weeks Of Treatment: 2 History: Diabetes,  Neuropathy Clustered Wound: No Photos Photo Uploaded By: Alejandro Mulling on 06/22/2015 16:16:43 Wound Measurements Length: (cm) 0.7 Width: (cm) 1.5 Depth: (cm) 0.1 Area: (cm) 0.825 Volume: (cm) 0.082 % Reduction in Area: 32.7% % Reduction in Volume: 33.3% Epithelialization: None Tunneling: No Undermining: No Wound Description Classification: Grade 1 Wound Margin: Distinct, outline attached Exudate Amount: Medium Foul Odor After Cleansing: No Wound Bed Granulation Amount: None Present (0%) Exposed Structure Necrotic Amount: Large (67-100%) Fascia Exposed: No Necrotic Quality: Eschar Fat Layer Exposed: No Tendon Exposed: No Muscle Exposed: No Joint Exposed: No Tallarico, Orey P. ([id number]) Bone Exposed: No Limited to Skin Breakdown Periwound Skin Texture Texture Color No Abnormalities Noted: No No Abnormalities Noted: No Callus: No Atrophie Blanche: No Crepitus: No Cyanosis: No Excoriation: No Ecchymosis: No Fluctuance: No Erythema: No Friable: No Hemosiderin Staining: No Induration: No Mottled: No Localized Edema: No Pallor: No Rash: No Rubor: No Scarring: No Temperature / Pain Moisture Temperature: No Abnormality No Abnormalities Noted: No Dry / Scaly: No Maceration: No Moist: Yes Wound Preparation Ulcer Cleansing: Rinsed/Irrigated with Saline Topical Anesthetic Applied: Other: lidocaine  4%, Treatment Notes Wound #4 (Left Toe Second) 1. Cleansed with: Clean wound with Normal Saline 2. Anesthetic Topical Lidocaine 4% cream to wound bed prior to debridement 4. Dressing Applied: Silvadene Cream 5. Secondary Dressing Applied Gauze and Kerlix/Conform Electronic Signature(s) Signed: 06/22/2015 4:28:27 PM By: Alejandro Mulling Entered By: Alejandro Mulling on 06/22/2015 14:29:37 Morrical, [name] ([id number]) -------------------------------------------------------------------------------- Wound Assessment Details Patient Name: Pultz, Dantonio P. Date of Service: 06/22/2015 2:15 PM Medical Record Number: [id number] Patient Account Number: [id number] Date of Birth/Sex: [birth year] (54 y.o. Male) Treating RN: Phillis Haggis Primary Care Physician: Hillery Aldo Other Clinician: Referring Physician: Hillery Aldo Treating Physician/Extender: Elayne Snare in Treatment: 2 Wound Status Wound Number: 5 Primary Diabetic Wound/Ulcer of the Lower Etiology: Extremity Wound Location: Left Toe Third Wound Open Wounding Event: Thermal Burn Status: Date Acquired: 05/29/2015 Comorbid Arrhythmia, Hypertension, Type II Weeks Of Treatment: 2 History: Diabetes, Neuropathy Clustered Wound: No Photos Photo Uploaded By: Alejandro Mulling on 06/22/2015 16:17:06 Wound Measurements Length: (cm) 1.6 Width: (cm) 1.8 Depth: (cm) 0.1 Area: (cm) 2.262 Volume: (cm) 0.226 % Reduction in Area: 36% % Reduction in Volume: 36% Epithelialization: None Tunneling: No Undermining: No Wound Description Classification: Grade 1 Wound Margin: Distinct, outline attached Exudate Amount: Medium Exudate Type: Serosanguineous Exudate Color: red, brown Foul Odor After Cleansing: No Wound Bed Granulation Amount: None Present (0%) Exposed Structure Necrotic Amount: Large (67-100%) Fascia Exposed: No Necrotic Quality: Adherent Slough Fat Layer Exposed: No Tendon Exposed: No Squibb, Owen P.  ([id number]) Muscle Exposed: No Joint Exposed: No Bone Exposed: No Limited to Skin Breakdown Periwound Skin Texture Texture Color No Abnormalities Noted: No No Abnormalities Noted: No Callus: No Atrophie Blanche: No Crepitus: No Cyanosis: No Excoriation: No Ecchymosis: No Fluctuance: No Erythema: No Friable: No Hemosiderin Staining: No Induration: No Mottled: No Localized Edema: No Pallor: No Rash: No Rubor: No Scarring: No Temperature / Pain Moisture Temperature: No Abnormality No Abnormalities Noted: No Dry / Scaly: No Maceration: No Moist: Yes Wound Preparation Ulcer Cleansing: Rinsed/Irrigated with Saline Topical Anesthetic Applied: Other: lidocaine 4%, Treatment Notes Wound #5 (Left Toe Third) 1. Cleansed with: Clean wound with Normal Saline 2. Anesthetic Topical Lidocaine 4% cream to wound bed prior to debridement 4. Dressing Applied: Silvadene Cream 5. Secondary Dressing Applied Gauze and Kerlix/Conform Electronic Signature(s) Signed: 06/22/2015 4:28:27 PM By: Alejandro Mulling Entered By: Alejandro Mulling on 06/22/2015 14:30:09 Grandison, [name] ([id number]) -------------------------------------------------------------------------------- Wound  Assessment Details Patient Name: [name], [name] ARRONA. Date of Service: 06/22/2015 2:15 PM Medical Record Number: [id number] Patient Account Number: [id number] Date of Birth/Sex: January 13, 1961 (54 y.o. Male) Treating RN: Phillis Haggis Primary Care Physician: Hillery Aldo Other Clinician: Referring Physician: Hillery Aldo Treating Physician/Extender: Elayne Snare in Treatment: 2 Wound Status Wound Number: 6 Primary Diabetic Wound/Ulcer of the Lower Etiology: Extremity Wound Location: Left Toe Fourth Wound Open Wounding Event: Thermal Burn Status: Date Acquired: 05/29/2015 Comorbid Arrhythmia, Hypertension, Type II Weeks Of Treatment: 2 History: Diabetes, Neuropathy Clustered Wound: No Photos Photo Uploaded  By: Alejandro Mulling on 06/22/2015 16:17:57 Wound Measurements Length: (cm) 0.5 Width: (cm) 0.5 Depth: (cm) 0.1 Area: (cm) 0.196 Volume: (cm) 0.02 % Reduction in Area: 88.7% % Reduction in Volume: 88.4% Epithelialization: None Tunneling: No Undermining: No Wound Description Classification: Grade 1 Wound Margin: Distinct, outline attached Exudate Amount: Small Exudate Type: Serosanguineous Exudate Color: red, brown Foul Odor After Cleansing: No Wound Bed Granulation Amount: None Present (0%) Exposed Structure Necrotic Amount: Large (67-100%) Fascia Exposed: No Necrotic Quality: Eschar, Adherent Slough Fat Layer Exposed: No Tendon Exposed: No Lupton, Odyn P. ([id number]) Muscle Exposed: No Joint Exposed: No Bone Exposed: No Limited to Skin Breakdown Periwound Skin Texture Texture Color No Abnormalities Noted: No No Abnormalities Noted: No Callus: No Atrophie Blanche: No Crepitus: No Cyanosis: No Excoriation: No Ecchymosis: No Fluctuance: No Erythema: No Friable: No Hemosiderin Staining: No Induration: No Mottled: No Localized Edema: Yes Pallor: No Rash: No Rubor: No Scarring: No Temperature / Pain Moisture Temperature: No Abnormality No Abnormalities Noted: No Dry / Scaly: No Maceration: No Moist: Yes Wound Preparation Ulcer Cleansing: Rinsed/Irrigated with Saline Topical Anesthetic Applied: Other: lidocaine 4%, Treatment Notes Wound #6 (Left Toe Fourth) 1. Cleansed with: Clean wound with Normal Saline 2. Anesthetic Topical Lidocaine 4% cream to wound bed prior to debridement 4. Dressing Applied: Silvadene Cream 5. Secondary Dressing Applied Gauze and Kerlix/Conform Electronic Signature(s) Signed: 06/22/2015 4:28:27 PM By: Alejandro Mulling Entered By: Alejandro Mulling on 06/22/2015 14:28:24 Cumby, [name] ([id number]) -------------------------------------------------------------------------------- Wound Assessment Details Patient Name:  Sider, Keandre P. Date of Service: 06/22/2015 2:15 PM Medical Record Number: [id number] Patient Account Number: [id number] Date of Birth/Sex: 1961/05/20 (54 y.o. Male) Treating RN: Phillis Haggis Primary Care Physician: Hillery Aldo Other Clinician: Referring Physician: Hillery Aldo Treating Physician/Extender: Elayne Snare in Treatment: 2 Wound Status Wound Number: 7 Primary Diabetic Wound/Ulcer of the Lower Etiology: Extremity Wound Location: Right Toe Second Wound Open Wounding Event: Thermal Burn Status: Date Acquired: 05/29/2015 Comorbid Arrhythmia, Hypertension, Type II Weeks Of Treatment: 2 History: Diabetes, Neuropathy Clustered Wound: No Photos Photo Uploaded By: Alejandro Mulling on 06/22/2015 16:18:18 Wound Measurements Length: (cm) 2 Width: (cm) 0.6 Depth: (cm) 0.1 Area: (cm) 0.942 Volume: (cm) 0.094 % Reduction in Area: -50% % Reduction in Volume: -49.2% Epithelialization: None Tunneling: No Undermining: No Wound Description Classification: Grade 1 Wound Margin: Distinct, outline attached Exudate Amount: None Present Foul Odor After Cleansing: No Wound Bed Granulation Amount: None Present (0%) Exposed Structure Necrotic Amount: Large (67-100%) Fascia Exposed: No Necrotic Quality: Eschar Fat Layer Exposed: No Tendon Exposed: No Muscle Exposed: No Joint Exposed: No Keay, Erhardt P. ([id number]) Bone Exposed: No Limited to Skin Breakdown Periwound Skin Texture Texture Color No Abnormalities Noted: No No Abnormalities Noted: No Callus: No Atrophie Blanche: No Crepitus: No Cyanosis: No Excoriation: No Ecchymosis: No Fluctuance: No Erythema: No Friable: No Hemosiderin Staining: No Induration: No Mottled: No Localized Edema: No Pallor: No Rash: No Rubor: No Scarring: No Temperature /  Pain Moisture Temperature: No Abnormality No Abnormalities Noted: No Dry / Scaly: Yes Maceration: No Moist: No Wound Preparation Ulcer Cleansing:  Rinsed/Irrigated with Saline Topical Anesthetic Applied: Other: LIDOCAINE 4%, Treatment Notes Wound #7 (Right Toe Second) 1. Cleansed with: Clean wound with Normal Saline 2. Anesthetic Topical Lidocaine 4% cream to wound bed prior to debridement 4. Dressing Applied: Silvadene Cream 5. Secondary Dressing Applied Gauze and Kerlix/Conform Electronic Signature(s) Signed: 06/22/2015 4:28:27 PM By: Alejandro Mulling Entered By: Alejandro Mulling on 06/22/2015 14:26:52 Lagman, [name] ([id number]) -------------------------------------------------------------------------------- Wound Assessment Details Patient Name: Spacek, Geran P. Date of Service: 06/22/2015 2:15 PM Medical Record Number: [id number] Patient Account Number: [id number] Date of Birth/Sex: 08-30-1960 (54 y.o. Male) Treating RN: Phillis Haggis Primary Care Physician: Hillery Aldo Other Clinician: Referring Physician: Hillery Aldo Treating Physician/Extender: Elayne Snare in Treatment: 2 Wound Status Wound Number: 8 Primary Diabetic Wound/Ulcer of the Lower Etiology: Extremity Wound Location: Right Toe Third Wound Open Wounding Event: Thermal Burn Status: Date Acquired: 05/29/2015 Comorbid Arrhythmia, Hypertension, Type II Weeks Of Treatment: 2 History: Diabetes, Neuropathy Clustered Wound: No Photos Photo Uploaded By: Alejandro Mulling on 06/22/2015 16:18:34 Wound Measurements Length: (cm) 0.3 Width: (cm) 0.3 Depth: (cm) 0.1 Area: (cm) 0.071 Volume: (cm) 0.007 % Reduction in Area: 72.6% % Reduction in Volume: 73.1% Epithelialization: None Tunneling: No Undermining: No Wound Description Classification: Grade 1 Wound Margin: Distinct, outline attached Exudate Amount: None Present Foul Odor After Cleansing: No Wound Bed Granulation Amount: None Present (0%) Exposed Structure Necrotic Amount: Large (67-100%) Fascia Exposed: No Necrotic Quality: Eschar Fat Layer Exposed: No Tendon Exposed: No Muscle  Exposed: No Joint Exposed: No Clymer, Noelle P. ([id number]) Bone Exposed: No Limited to Skin Breakdown Periwound Skin Texture Texture Color No Abnormalities Noted: No No Abnormalities Noted: No Callus: No Atrophie Blanche: No Crepitus: No Cyanosis: No Excoriation: No Ecchymosis: No Fluctuance: No Erythema: No Friable: No Hemosiderin Staining: No Induration: No Mottled: No Localized Edema: No Pallor: No Rash: No Rubor: No Scarring: No Temperature / Pain Moisture Temperature: No Abnormality No Abnormalities Noted: No Dry / Scaly: Yes Maceration: No Moist: No Wound Preparation Ulcer Cleansing: Rinsed/Irrigated with Saline Topical Anesthetic Applied: Other: LIDOCAINE 4%, Treatment Notes Wound #8 (Right Toe Third) 1. Cleansed with: Clean wound with Normal Saline 2. Anesthetic Topical Lidocaine 4% cream to wound bed prior to debridement 4. Dressing Applied: Silvadene Cream 5. Secondary Dressing Applied Gauze and Kerlix/Conform Electronic Signature(s) Signed: 06/22/2015 4:28:27 PM By: Alejandro Mulling Entered By: Alejandro Mulling on 06/22/2015 14:27:32 Moctezuma, [name] ([id number]) -------------------------------------------------------------------------------- Wound Assessment Details Patient Name: Wittke, Halvor P. Date of Service: 06/22/2015 2:15 PM Medical Record Number: [id number] Patient Account Number: [id number] Date of Birth/Sex: Feb 25, 1961 (54 y.o. Male) Treating RN: Phillis Haggis Primary Care Physician: Hillery Aldo Other Clinician: Referring Physician: Hillery Aldo Treating Physician/Extender: Elayne Snare in Treatment: 2 Wound Status Wound Number: 9 Primary Diabetic Wound/Ulcer of the Lower Etiology: Extremity Wound Location: Right Toe Fourth Wound Open Wounding Event: Thermal Burn Status: Date Acquired: 05/28/2015 Comorbid Arrhythmia, Hypertension, Type II Weeks Of Treatment: 2 History: Diabetes, Neuropathy Clustered Wound: No Photos Photo  Uploaded By: Alejandro Mulling on 06/22/2015 16:19:14 Wound Measurements Length: (cm) 1.3 Width: (cm) 0.4 Depth: (cm) 0.1 Area: (cm) 0.408 Volume: (cm) 0.041 % Reduction in Area: 0% % Reduction in Volume: 0% Epithelialization: None Tunneling: No Undermining: No Wound Description Classification: Grade 1 Wound Margin: Distinct, outline attached Exudate Amount: None Present Foul Odor After Cleansing: No Wound Bed Granulation Amount: None Present (0%) Exposed Structure Necrotic  Amount: Large (67-100%) Fascia Exposed: No Necrotic Quality: Eschar Fat Layer Exposed: No Tendon Exposed: No Muscle Exposed: No Joint Exposed: No Notch, Jamicah P. ([id number]) Bone Exposed: No Limited to Skin Breakdown Periwound Skin Texture Texture Color No Abnormalities Noted: No No Abnormalities Noted: No Callus: No Atrophie Blanche: No Crepitus: No Cyanosis: No Excoriation: No Ecchymosis: No Fluctuance: No Erythema: No Friable: No Hemosiderin Staining: No Induration: No Mottled: No Localized Edema: No Pallor: No Rash: No Rubor: No Scarring: No Temperature / Pain Moisture Temperature: No Abnormality No Abnormalities Noted: No Dry / Scaly: Yes Maceration: No Moist: No Wound Preparation Ulcer Cleansing: Rinsed/Irrigated with Saline Topical Anesthetic Applied: Other: lidocaine 4%, Treatment Notes Wound #9 (Right Toe Fourth) 1. Cleansed with: Clean wound with Normal Saline 2. Anesthetic Topical Lidocaine 4% cream to wound bed prior to debridement 4. Dressing Applied: Silvadene Cream 5. Secondary Dressing Applied Gauze and Kerlix/Conform Electronic Signature(s) Signed: 06/22/2015 4:28:27 PM By: Alejandro Mulling Entered By: Alejandro Mulling on 06/22/2015 14:27:57 Ratz, [name] ([id number]) -------------------------------------------------------------------------------- Vitals Details Patient Name: Piccini, Jahari P. Date of Service: 06/22/2015 2:15 PM Medical Record Number:  [id number] Patient Account Number: [id number] Date of Birth/Sex: 1960-11-02 (54 y.o. Male) Treating RN: Phillis Haggis Primary Care Physician: Hillery Aldo Other Clinician: Referring Physician: Hillery Aldo Treating Physician/Extender: Elayne Snare in Treatment: 2 Vital Signs Time Taken: 14:19 Temperature (F): 98.1 Height (in): 75 Pulse (bpm): 112 Weight (lbs): 321 Respiratory Rate (breaths/min): 20 Body Mass Index (BMI): 40.1 Blood Pressure (mmHg): 122/86 Reference Range: 80 - 120 mg / dl Pulse Oximetry (%): 96 Electronic Signature(s) Signed: 06/22/2015 4:28:27 PM By: Alejandro Mulling Entered By: Alejandro Mulling on 06/22/2015 14:20:21

## 2015-06-28 ENCOUNTER — Encounter: Payer: Medicare HMO | Attending: Surgery | Admitting: Surgery

## 2015-06-28 DIAGNOSIS — S99922A Unspecified injury of left foot, initial encounter: Secondary | ICD-10-CM | POA: Diagnosis not present

## 2015-06-28 DIAGNOSIS — J449 Chronic obstructive pulmonary disease, unspecified: Secondary | ICD-10-CM | POA: Insufficient documentation

## 2015-06-28 DIAGNOSIS — R600 Localized edema: Secondary | ICD-10-CM | POA: Insufficient documentation

## 2015-06-28 DIAGNOSIS — E11621 Type 2 diabetes mellitus with foot ulcer: Secondary | ICD-10-CM | POA: Insufficient documentation

## 2015-06-28 DIAGNOSIS — E11622 Type 2 diabetes mellitus with other skin ulcer: Secondary | ICD-10-CM | POA: Insufficient documentation

## 2015-06-28 DIAGNOSIS — F1010 Alcohol abuse, uncomplicated: Secondary | ICD-10-CM | POA: Insufficient documentation

## 2015-06-28 DIAGNOSIS — E1140 Type 2 diabetes mellitus with diabetic neuropathy, unspecified: Secondary | ICD-10-CM | POA: Diagnosis not present

## 2015-06-28 DIAGNOSIS — I824Y2 Acute embolism and thrombosis of unspecified deep veins of left proximal lower extremity: Secondary | ICD-10-CM | POA: Insufficient documentation

## 2015-06-28 DIAGNOSIS — T25222A Burn of second degree of left foot, initial encounter: Secondary | ICD-10-CM | POA: Diagnosis not present

## 2015-06-28 DIAGNOSIS — I10 Essential (primary) hypertension: Secondary | ICD-10-CM | POA: Insufficient documentation

## 2015-06-28 DIAGNOSIS — I509 Heart failure, unspecified: Secondary | ICD-10-CM | POA: Diagnosis not present

## 2015-06-28 DIAGNOSIS — T25231A Burn of second degree of right toe(s) (nail), initial encounter: Secondary | ICD-10-CM | POA: Diagnosis not present

## 2015-06-28 DIAGNOSIS — Z955 Presence of coronary angioplasty implant and graft: Secondary | ICD-10-CM | POA: Insufficient documentation

## 2015-06-28 DIAGNOSIS — F329 Major depressive disorder, single episode, unspecified: Secondary | ICD-10-CM | POA: Diagnosis not present

## 2015-06-28 DIAGNOSIS — T25292A Burn of second degree of multiple sites of left ankle and foot, initial encounter: Secondary | ICD-10-CM | POA: Diagnosis not present

## 2015-06-28 DIAGNOSIS — J45909 Unspecified asthma, uncomplicated: Secondary | ICD-10-CM | POA: Diagnosis not present
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
## 2015-06-29 NOTE — Progress Notes (Signed)
[name], [name] ([id number]) Visit Report for 06/28/2015 Arrival Information Details Patient Name: [name], [name]. Date of Service: 06/28/2015 2:45 PM Medical Record Number: [id number] Patient Account Number: [id number] Date of Birth/Sex: May 06, 1961 (55 y.o. Male) Treating RN: Clover Mealy, RN, BSN, Grandfather Sink Primary Care Physician: Hillery Aldo Other Clinician: Referring Physician: Hillery Aldo Treating Physician/Extender: BURNS III, Regis Bill in Treatment: 2 Visit Information History Since Last Visit Added or deleted any medications: No Patient Arrived: Ambulatory Any new allergies or adverse reactions: No Arrival Time: 14:50 Had a fall or experienced change in No Accompanied By: self activities of daily living that may affect Transfer Assistance: None risk of falls: Patient Identification Verified: Yes Signs or symptoms of abuse/neglect since last No Secondary Verification Process Yes visito Completed: Hospitalized since last visit: No Patient Requires Transmission- No Has Dressing in Place as Prescribed: Yes Based Precautions: Pain Present Now: No Patient Has Alerts: Yes Patient Alerts: Patient on Blood Thinner plavix, DM 2 ABI [place] BILATERAL >220 Electronic Signature(s) Signed: 06/28/2015 4:44:03 PM By: Elpidio Eric BSN, RN Entered By: Elpidio Eric on 06/28/2015 14:50:31 Mehringer, [name] ([id number]) -------------------------------------------------------------------------------- Encounter Discharge Information Details Patient Name: Salaam, [name] Date of Service: 06/28/2015 2:45 PM Medical Record Number: [id number] Patient Account Number: [id number] Date of Birth/Sex: May 22, 1961 (55 y.o. Male) Treating RN: Primary Care Physician: Hillery Aldo Other Clinician: Referring Physician: Hillery Aldo Treating Physician/Extender: BURNS III, Regis Bill in Treatment: 2 Encounter Discharge Information Items Discharge Pain Level: 0 Discharge Condition: Stable Ambulatory Status:  Ambulatory Discharge Destination: Home Transportation: Private Auto Accompanied By: self Schedule Follow-up Appointment: No Medication Reconciliation completed and provided to Patient/Care No Kaizlee Carlino: Provided on Clinical Summary of Care: 06/28/2015 Form Type Recipient Paper Patient JC Electronic Signature(s) Signed: 06/28/2015 4:44:03 PM By: Elpidio Eric BSN, RN Previous Signature: 06/28/2015 3:34:34 PM Version By: Gwenlyn Perking Entered By: Elpidio Eric on 06/28/2015 15:35:28 Brannum, [name] ([id number]) -------------------------------------------------------------------------------- Lower Extremity Assessment Details Patient Name: [name], [name] Date of Service: 06/28/2015 2:45 PM Medical Record Number: [id number] Patient Account Number: [id number] Date of Birth/Sex: 03/09/1961 (55 y.o. Male) Treating RN: Clover Mealy, RN, BSN, [name] Primary Care Physician: Hillery Aldo Other Clinician: Referring Physician: Hillery Aldo Treating Physician/Extender: BURNS III, Regis Bill in Treatment: 2 Edema Assessment Assessed: [Left: No] [Right: No] [Left: Edema] [Right: :] Calf Left: Right: Point of Measurement: 37 cm From Medial Instep 53 cm 46.7 cm Ankle Left: Right: Point of Measurement: 12 cm From Medial Instep 34.7 cm 28.2 cm Vascular Assessment Claudication: Claudication Assessment [Left:None] [Right:None] Pulses: Posterior Tibial Dorsalis Pedis Palpable: [Left:Yes] [Right:Yes] Extremity colors, hair growth, and conditions: Extremity Color: [Left:Normal] [Right:Normal] Hair Growth on Extremity: [Left:Yes] [Right:Yes] Temperature of Extremity: [Left:Warm] [Right:Warm] Capillary Refill: [Left:< 3 seconds] [Right:< 3 seconds] Toe Nail Assessment Left: Right: Thick: Yes Discolored: No No Deformed: No No Improper Length and Hygiene: No No Electronic Signature(s) Signed: 06/28/2015 4:44:03 PM By: Elpidio Eric BSN, RN Entered By: Elpidio Eric on 06/28/2015 14:58:59 Koos, [name] Lofgren, Jennifer Demetrius Charity ([id number]) -------------------------------------------------------------------------------- Multi Wound Chart Details Patient Name: [name], [name] Date of Service: 06/28/2015 2:45 PM Medical Record Number: [id number] Patient Account Number: [id number] Date of Birth/Sex: [birth year] (55 y.o. Male) Treating RN: Clover Mealy, RN, BSN, [place] Sink Primary Care Physician: Hillery Aldo Other Clinician: Referring Physician: Hillery Aldo Treating Physician/Extender: BURNS III, Regis Bill in Treatment: 2 Vital Signs Height(in): 75 Pulse(bpm): 77 Weight(lbs): 321 Blood Pressure 120/73 (mmHg): Body Mass Index(BMI): 40 Temperature(F): 98.2 Respiratory Rate 20 (breaths/min): Photos: [1:No Photos] [10:No Photos] [2:No Photos] Wound  Location: [1:Left Lower Leg - Proximal Left Lower Leg - Lateral Left Lower Leg - Distal] Wounding Event: [1:Thermal Burn] [10:Thermal Burn] [2:Thermal Burn] Primary Etiology: [1:2nd degree Burn] [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] Comorbid History: [1:Arrhythmia, Hypertension, Arrhythmia, Hypertension, Arrhythmia, Hypertension, Type II Diabetes, Neuropathy] [10:Type II Diabetes, Neuropathy] [2:Type II Diabetes, Neuropathy] Date Acquired: [1:05/29/2015] [10:05/29/2015] [2:05/29/2015] Weeks of Treatment: [1:2] [10:2] [2:2] Wound Status: [1:Healed - Epithelialized] [10:Open] [2:Open] Measurements L x W x D 0x0x0 [10:4x1.5x0.1] [2:1.2x3.5x0.1] (cm) Area (cm) : [1:0] [10:4.712] [2:3.299] Volume (cm) : [1:0] [10:0.471] [2:0.33] % Reduction in Area: [1:100.00%] [10:20.00%] [2:89.90%] % Reduction in Volume: 100.00% [10:20.00%] [2:94.90%] Classification: [1:Full Thickness Without Exposed Support Structures] [10:Grade 1] [2:Full Thickness Without Exposed Support Structures] HBO Classification: [1:Grade 1] [10:N/A] [2:Grade 1] Exudate Amount: [1:None Present] [10:Small] [2:Small] Exudate Type: [1:N/A] [10:Serosanguineous]  [2:Serosanguineous] Exudate Color: [1:N/A] [10:red, brown] [2:red, brown] Wound Margin: [1:Distinct, outline attached Distinct, outline attached Distinct, outline attached] Granulation Amount: [1:None Present (0%)] [10:Large (67-100%)] [2:None Present (0%)] Granulation Quality: [1:N/A] [10:Pink, Pale] [2:N/A] Necrotic Amount: [1:None Present (0%)] [10:Small (1-33%)] [2:Large (67-100%)] Necrotic Tissue: [1:N/A] [10:Adherent Slough] [2:Adherent Slough] Exposed Structures: Wolfinger, Nathanel P. ([id number]) Fascia: No Fascia: No Fascia: No Fat: No Fat: No Fat: No Tendon: No Tendon: No Tendon: No Muscle: No Muscle: No Muscle: No Joint: No Joint: No Joint: No Bone: No Bone: No Bone: No Limited to Skin Limited to Skin Limited to Skin Breakdown Breakdown Breakdown Epithelialization: None None Medium (34-66%) Periwound Skin Texture: Edema: Yes Edema: Yes Edema: Yes Excoriation: No Excoriation: No Excoriation: No Induration: No Induration: No Induration: No Callus: No Callus: No Callus: No Crepitus: No Crepitus: No Crepitus: No Fluctuance: No Fluctuance: No Fluctuance: No Friable: No Friable: No Friable: No Rash: No Rash: No Rash: No Scarring: No Scarring: No Scarring: No Periwound Skin Dry/Scaly: Yes Moist: Yes Moist: Yes Moisture: Maceration: No Maceration: No Maceration: No Moist: No Dry/Scaly: No Dry/Scaly: No Periwound Skin Color: Atrophie Blanche: No Atrophie Blanche: No Atrophie Blanche: No Cyanosis: No Cyanosis: No Cyanosis: No Ecchymosis: No Ecchymosis: No Ecchymosis: No Erythema: No Erythema: No Erythema: No Hemosiderin Staining: No Hemosiderin Staining: No Hemosiderin Staining: No Mottled: No Mottled: No Mottled: No Pallor: No Pallor: No Pallor: No Rubor: No Rubor: No Rubor: No Temperature: No Abnormality No Abnormality No Abnormality Tenderness on No No No Palpation: Wound Preparation: Ulcer Cleansing: Ulcer Cleansing: Ulcer  Cleansing: Rinsed/Irrigated with Rinsed/Irrigated with Rinsed/Irrigated with Saline Saline Saline Topical Anesthetic Topical Anesthetic Topical Anesthetic Applied: None Applied: Other: lidocaine Applied: Other: lidocaine 4% 4% Wound Number: [id number]  Photos: No Photos No Photos No Photos Wound Location: Left Toe Great Left Toe Second Left Toe Third Wounding Event: Thermal Burn Thermal Burn Thermal Burn Primary Etiology: Diabetic Wound/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 the Lower Extremity the Lower Extremity Comorbid History: Arrhythmia, Hypertension, N/A N/A Type II Diabetes, Neuropathy Date Acquired: 05/29/2015 05/29/2015 05/29/2015 VINEET, KINNEY ([id number]) Weeks of Treatment: 2 2 2  Wound Status: Open Healed - Epithelialized Healed - Epithelialized Measurements L x W x D 2.2x1.2x0.1 0x0x0 0x0x0 (cm) Area (cm) : 2.073 0 0 Volume (cm) : 0.207 0 0 % Reduction in Area: 64.70% 100.00% 100.00% % Reduction in Volume: 64.70% 100.00% 100.00% Classification: Grade 1 Grade 1 Grade 1 HBO Classification: N/A N/A N/A Exudate Amount: None Present N/A N/A Exudate Type: N/A N/A N/A Exudate Color: N/A N/A N/A Wound Margin: Distinct, outline attached N/A N/A Granulation Amount: None Present (0%) N/A N/A  Granulation Quality: N/A N/A N/A Necrotic Amount: Large (67-100%) N/A N/A Necrotic Tissue: Adherent Slough N/A N/A Exposed Structures: Fascia: No N/A N/A Fat: No Tendon: No Muscle: No Joint: No Bone: No Limited to Skin Breakdown Epithelialization: None N/A N/A Periwound Skin Texture: Edema: Yes No Abnormalities Noted No Abnormalities Noted Excoriation: No Induration: No Callus: No Crepitus: No Fluctuance: No Friable: No Rash: No Scarring: No Periwound Skin Dry/Scaly: Yes No Abnormalities Noted No Abnormalities Noted Moisture: Maceration: No Moist: No Periwound Skin Color: Atrophie Blanche: No No Abnormalities Noted No Abnormalities  Noted Cyanosis: No Ecchymosis: No Erythema: No Hemosiderin Staining: No Mottled: No Pallor: No Rubor: No Temperature: No Abnormality N/A N/A Tenderness on No No No Palpation: Grave, Renell P. 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 the Lower Extremity the Lower Extremity Comorbid History: N/A Arrhythmia, Hypertension, Arrhythmia, Hypertension, Type II Diabetes, Type II Diabetes, Neuropathy Neuropathy Date Acquired: 05/29/2015 05/29/2015 05/29/2015 Weeks of Treatment: 2 2 2  Wound Status: Healed - Epithelialized Open Open Measurements L x W x D 0x0x0 1.8x0.4x0.1 0x0x0 (cm) Area (cm) : 0 0.565 0 Volume (cm) : 0 0.057 0 % Reduction in Area: 100.00% 10.00% 100.00% % Reduction in Volume: 100.00% 9.50% 100.00% Classification: Grade 1 Grade 1 Grade 1 HBO Classification: N/A N/A N/A Exudate Amount: N/A None Present None Present Exudate Type: N/A N/A N/A Exudate Color: N/A N/A N/A Wound Margin: N/A Distinct, outline attached Distinct, outline attached Granulation Amount: N/A None Present (0%) None Present (0%) Granulation Quality: N/A N/A N/A Necrotic Amount: N/A Large (67-100%) None Present (0%) Necrotic Tissue: N/A Eschar N/A Exposed Structures: N/A Fascia: No Fascia: No Fat: No Fat: No Tendon: No Tendon: No Muscle: No Muscle: No Joint: No Joint: No Bone: No Bone: No Limited to Skin Limited to Skin Breakdown Breakdown Epithelialization: N/A None Large (67-100%) Periwound Skin Texture: No Abnormalities Noted Edema: No Edema: No Excoriation: No Excoriation: No Induration:  No Induration: No Callus: No Callus: No Burgen, Morgon P. ([id number]) Crepitus: No Crepitus: No Fluctuance: No Fluctuance: No Friable: No Friable: No Rash: No Rash: No Scarring: No Scarring: No Periwound Skin No Abnormalities Noted Dry/Scaly: Yes Dry/Scaly: Yes Moisture: Maceration: No Maceration: No Moist: No Moist: No Periwound Skin Color: No Abnormalities Noted Atrophie Blanche: No Atrophie Blanche: No Cyanosis: No Cyanosis: No Ecchymosis: No Ecchymosis: No Erythema: No Erythema: No Hemosiderin Staining: No Hemosiderin Staining: No Mottled: No Mottled: No Pallor: No Pallor: No Rubor: No Rubor: No Temperature: N/A No Abnormality No Abnormality Tenderness on No No No Palpation: Wound Preparation: N/A Ulcer Cleansing: Ulcer Cleansing: Rinsed/Irrigated with Rinsed/Irrigated with Saline Saline Topical Anesthetic Topical Anesthetic Applied: Other: Applied: None LIDOCAINE 4% Wound Number: 9 N/A N/A Photos: No Photos N/A N/A Wound Location: Right Toe Fourth N/A N/A Wounding Event: Thermal Burn N/A N/A Primary Etiology: Diabetic Wound/Ulcer of N/A N/A the Lower Extremity Comorbid History: Arrhythmia, Hypertension, N/A N/A Type II Diabetes, Neuropathy Date Acquired: 05/28/2015 N/A N/A Weeks of Treatment: 2 N/A N/A Wound Status: Open N/A N/A Measurements L x W x D 1x0.2x0.1 N/A N/A (cm) Area (cm) : 0.157 N/A N/A Volume (cm) : 0.016 N/A N/A % Reduction in Area: 61.50% N/A N/A % Reduction in Volume:  61.00% N/A N/A Classification: Grade 1 N/A N/A HBO Classification: N/A N/A N/A Exudate Amount: None Present N/A N/A Exudate Type: N/A N/A N/A Exudate Color: N/A N/A N/A Terrero, Kaiel P. ([id number]) Wound Margin: Distinct, outline attached N/A N/A Granulation Amount: None Present (0%) N/A N/A Granulation Quality: N/A N/A N/A Necrotic Amount: Large (67-100%) N/A N/A Necrotic Tissue: Eschar N/A N/A Exposed Structures: Fascia: No N/A N/A Fat: No Tendon:  No Muscle: No Joint: No Bone: No Limited to Skin Breakdown Epithelialization: None N/A N/A Periwound Skin Texture: Edema: No N/A N/A Excoriation: No Induration: No Callus: No Crepitus: No Fluctuance: No Friable: No Rash: No Scarring: No Periwound Skin Dry/Scaly: Yes N/A N/A Moisture: Maceration: No Moist: No Periwound Skin Color: Atrophie Blanche: No N/A N/A Cyanosis: No Ecchymosis: No Erythema: No Hemosiderin Staining: No Mottled: No Pallor: No Rubor: No Temperature: No Abnormality N/A N/A Tenderness on No N/A N/A Palpation: Wound Preparation: Ulcer Cleansing: N/A N/A Rinsed/Irrigated with Saline Topical Anesthetic Applied: Other: lidocaine 4% Treatment Notes Electronic Signature(s) Signed: 06/28/2015 4:44:03 PM By: Elpidio Eric BSN, RN Gueye, Cambren Demetrius Charity ([id number]) Entered By: Elpidio Eric on 06/28/2015 15:16:48 Debell, [name] ([id number]) -------------------------------------------------------------------------------- Multi-Disciplinary Care Plan Details Patient Name: Deboer, Jen P. Date of Service: 06/28/2015 2:45 PM Medical Record Number: [id number] Patient Account Number: [id number] Date of Birth/Sex: Jan 22, 1961 (55 y.o. Male) Treating RN: Clover Mealy, RN, BSN, [place] Sink Primary Care Physician: Hillery Aldo Other Clinician: Referring Physician: Hillery Aldo Treating Physician/Extender: BURNS III, Regis Bill in Treatment: 2 Active Inactive Orientation to the Wound Care Program Nursing Diagnoses: Knowledge deficit related to the wound healing center program Goals: Patient/caregiver will verbalize understanding of the Wound Healing Center Program Date Initiated: 06/08/2015 Goal Status: Active Interventions: Provide education on orientation to the wound center Notes: Wound/Skin Impairment Nursing Diagnoses: Impaired tissue integrity Knowledge deficit related to ulceration/compromised skin integrity Goals: Patient/caregiver will verbalize understanding of skin care  regimen Date Initiated: 06/08/2015 Goal Status: Active Ulcer/skin breakdown will have a volume reduction of 30% by week 4 Date Initiated: 06/08/2015 Goal Status: Active Ulcer/skin breakdown will have a volume reduction of 50% by week 8 Date Initiated: 06/08/2015 Goal Status: Active Ulcer/skin breakdown will have a volume reduction of 80% by week 12 Date Initiated: 06/08/2015 Goal Status: Active Ulcer/skin breakdown will heal within 14 weeks Date Initiated: 06/08/2015 RONDLE, LOHSE ([id number]) Goal Status: Active Interventions: Assess patient/caregiver ability to obtain necessary supplies Assess patient/caregiver ability to perform ulcer/skin care regimen upon admission and as needed Assess ulceration(s) every visit Provide education on smoking Provide education on ulcer and skin care Notes: Electronic Signature(s) Signed: 06/28/2015 4:44:03 PM By: Elpidio Eric BSN, RN Entered By: Elpidio Eric on 06/28/2015 15:16:33 Mcconnell, [name] ([id number]) -------------------------------------------------------------------------------- Pain Assessment Details Patient Name: Sumpter, Kden P. Date of Service: 06/28/2015 2:45 PM Medical Record Number: [id number] Patient Account Number: [id number] Date of Birth/Sex: Dec 07, 1960 (55 y.o. Male) Treating RN: Clover Mealy, RN, BSN, [place] Sink Primary Care Physician: Hillery Aldo Other Clinician: Referring Physician: Hillery Aldo Treating Physician/Extender: BURNS III, Regis Bill in Treatment: 2 Active Problems Location of Pain Severity and Description of Pain Patient Has Paino No Site Locations Pain Management and Medication Current Pain Management: Electronic Signature(s) Signed: 06/28/2015 4:44:03 PM By: Elpidio Eric BSN, RN Entered By: Elpidio Eric on 06/28/2015 14:50:39 Manner, [name] ([id number]) -------------------------------------------------------------------------------- Patient/Caregiver Education Details Patient Name: [name]. Date of Service:  06/28/2015 2:45 PM Medical Record Number: [id number] Patient Account Number: [id number] Date of Birth/Gender: 1961/04/28 (55 y.o. Male) Treating RN: Clover Mealy, RN, BSN, [employer]  Primary Care Physician: Hillery Aldo Other Clinician: Referring Physician: Hillery Aldo Treating Physician/Extender: BURNS III, Regis Bill in Treatment: 2 Education Assessment Education Provided To: Patient Education Topics Provided Smoking and Wound Healing: Methods: Explain/Verbal Responses: State content correctly Welcome To The Wound Care Center: Methods: Explain/Verbal Responses: State content correctly Wound/Skin Impairment: Methods: Explain/Verbal Responses: State content correctly Electronic Signature(s) Signed: 06/28/2015 4:44:03 PM By: Elpidio Eric BSN, RN Entered By: Elpidio Eric on 06/28/2015 15:35:44 Weyenberg, [name] ([id number]) -------------------------------------------------------------------------------- Wound Assessment Details Patient Name: Garvey, Greg P. Date of Service: 06/28/2015 2:45 PM Medical Record Number: [id number] Patient Account Number: [id number] Date of Birth/Sex: 12/20/1960 (55 y.o. Male) Treating RN: Clover Mealy, RN, BSN, [place] Sink Primary Care Physician: Hillery Aldo Other Clinician: Referring Physician: Hillery Aldo Treating Physician/Extender: BURNS III, Regis Bill in Treatment: 2 Wound Status Wound Number: 1 Primary 2nd degree Burn Etiology: Wound Location: Left Lower Leg - Proximal Wound Healed - Epithelialized Wounding Event: Thermal Burn Status: Date Acquired: 05/29/2015 Comorbid Arrhythmia, Hypertension, Type II Weeks Of Treatment: 2 History: Diabetes, Neuropathy Clustered Wound: No Photos Photo Uploaded By: Elpidio Eric on 06/28/2015 16:22:13 Wound Measurements Length: (cm) 0 % Reducti Width: (cm) 0 % Reducti Depth: (cm) 0 Epithelia Area: (cm) 0 Tunnelin Volume: (cm) 0 Undermin on in Area: 100% on in Volume: 100% lization: None g: No ing: No Wound Description Full  Thickness Without Classification: Exposed Support Structures Diabetic Severity Grade 1 (Wagner): Wound Margin: Distinct, outline attached Exudate Amount: None Present Foul Odor After Cleansing: No Wound Bed Granulation Amount: None Present (0%) Exposed Structure Necrotic Amount: None Present (0%) Fascia Exposed: No Fat Layer Exposed: No Tendon Exposed: No Buhman, Nichlas P. ([id number]) Muscle Exposed: No Joint Exposed: No Bone Exposed: No Limited to Skin Breakdown Periwound Skin Texture Texture Color No Abnormalities Noted: No No Abnormalities Noted: No Callus: No Atrophie Blanche: No Crepitus: No Cyanosis: No Excoriation: No Ecchymosis: No Fluctuance: No Erythema: No Friable: No Hemosiderin Staining: No Induration: No Mottled: No Localized Edema: Yes Pallor: No Rash: No Rubor: No Scarring: No Temperature / Pain Moisture Temperature: No Abnormality No Abnormalities Noted: No Dry / Scaly: Yes Maceration: No Moist: No Wound Preparation Ulcer Cleansing: Rinsed/Irrigated with Saline Topical Anesthetic Applied: None Electronic Signature(s) Signed: 06/28/2015 4:44:03 PM By: Elpidio Eric BSN, RN Entered By: Elpidio Eric on 06/28/2015 15:09:37 Weesner, [name] ([id number]) -------------------------------------------------------------------------------- Wound Assessment Details Patient Name: Ericsson, Kamrin P. Date of Service: 06/28/2015 2:45 PM Medical Record Number: [id number] Patient Account Number: [id number] Date of Birth/Sex: 08/02/1960 (55 y.o. Male) Treating RN: Clover Mealy, RN, BSN, [employer] [place] Sink Primary Care Physician: Hillery Aldo Other Clinician: Referring Physician: Hillery Aldo Treating Physician/Extender: BURNS III, WALTER Weeks in Treatment: 2 Wound Status Wound Number: 10 Primary Diabetic Wound/Ulcer of the Lower Etiology: Extremity Wound Location: Left Lower Leg - Lateral Wound Open Wounding Event: Thermal Burn Status: Date Acquired: 05/29/2015 Comorbid Arrhythmia,  Hypertension, Type II Weeks Of Treatment: 2 History: Diabetes, Neuropathy Clustered Wound: No Photos Photo Uploaded By: Elpidio Eric on 06/28/2015 16:22:13 Wound Measurements Length: (cm) 4 Width: (cm) 1.5 Depth: (cm) 0.1 Area: (cm) 4.712 Volume: (cm) 0.471 % Reduction in Area: 20% % Reduction in Volume: 20% Epithelialization: None Tunneling: No Undermining: No Wound Description Classification: Grade 1 Wound Margin: Distinct, outline attached Exudate Amount: Small Exudate Type: Serosanguineous Exudate Color: red, brown Gilani, Raeden P. ([id number]) Foul Odor After Cleansing: No Wound Bed Granulation Amount: Large (67-100%) Exposed Structure Granulation Quality: Pink, Pale Fascia Exposed: No Necrotic Amount: Small (1-33%) Fat Layer Exposed: No Necrotic Quality: Adherent Slough  Tendon Exposed: No Muscle Exposed: No Joint Exposed: No Bone Exposed: No Limited to Skin Breakdown Periwound Skin Texture Texture Color No Abnormalities Noted: No No Abnormalities Noted: No Callus: No Atrophie Blanche: No Crepitus: No Cyanosis: No Excoriation: No Ecchymosis: No Fluctuance: No Erythema: No Friable: No Hemosiderin Staining: No Induration: No Mottled: No Localized Edema: Yes Pallor: No Rash: No Rubor: No Scarring: No Temperature / Pain Moisture Temperature: No Abnormality No Abnormalities Noted: No Dry / Scaly: No Maceration: No Moist: Yes Wound Preparation Ulcer Cleansing: Rinsed/Irrigated with Saline Topical Anesthetic Applied: Other: lidocaine 4%, Treatment Notes Wound #10 (Left, Lateral Lower Leg) 1. Cleansed with: Clean wound with Normal Saline 4. Dressing Applied: Silvadene Cream 5. Secondary Dressing Applied Gauze and Kerlix/Conform 7. Secured with Tubigrip Electronic Signature(s) Signed: 06/28/2015 3:11:48 PM By: Elpidio Eric BSN, RN Rogstad, Lerry Demetrius Charity ([id number]) Entered By: Elpidio Eric on 06/28/2015 15:11:47 Irani, [name]  ([id number]) -------------------------------------------------------------------------------- Wound Assessment Details Patient Name: Claytor, Nimrod P. Date of Service: 06/28/2015 2:45 PM Medical Record Number: [id number] Patient Account Number: [id number] Date of Birth/Sex: 07-24-1960 (55 y.o. Male) Treating RN: Clover Mealy, RN, BSN, [place] Sink Primary Care Physician: Hillery Aldo Other Clinician: Referring Physician: Hillery Aldo Treating Physician/Extender: BURNS III, Regis Bill in Treatment: 2 Wound Status Wound Number: 2 Primary 2nd degree Burn Etiology: Wound Location: Left Lower Leg - Distal Wound Open Wounding Event: Thermal Burn Status: Date Acquired: 05/29/2015 Comorbid Arrhythmia, Hypertension, Type II Weeks Of Treatment: 2 History: Diabetes, Neuropathy Clustered Wound: No Photos Photo Uploaded By: Elpidio Eric on 06/28/2015 16:22:14 Wound Measurements Length: (cm) 1.2 Width: (cm) 3.5 Depth: (cm) 0.1 Area: (cm) 3.299 Volume: (cm) 0.33 % Reduction in Area: 89.9% % Reduction in Volume: 94.9% Epithelialization: Medium (34-66%) Tunneling: No Undermining: No Wound Description Full Thickness Without Exposed Foul Odor Classification: Support Structures Diabetic Severity Grade 1 (Wagner): Wound Margin: Distinct, outline attached Ange, Tige P. ([id number]) After Cleansing: No Exudate Amount: Small Exudate Type: Serosanguineous Exudate Color: red, brown Wound Bed Granulation Amount: None Present (0%) Exposed Structure Necrotic Amount: Large (67-100%) Fascia Exposed: No Necrotic Quality: Adherent Slough Fat Layer Exposed: No Tendon Exposed: No Muscle Exposed: No Joint Exposed: No Bone Exposed: No Limited to Skin Breakdown Periwound Skin Texture Texture Color No Abnormalities Noted: No No Abnormalities Noted: No Callus: No Atrophie Blanche: No Crepitus: No Cyanosis: No Excoriation: No Ecchymosis: No Fluctuance: No Erythema: No Friable: No Hemosiderin Staining:  No Induration: No Mottled: No Localized Edema: Yes Pallor: No Rash: No Rubor: No Scarring: No Temperature / Pain Moisture Temperature: No Abnormality No Abnormalities Noted: No Dry / Scaly: No Maceration: No Moist: Yes Wound Preparation Ulcer Cleansing: Rinsed/Irrigated with Saline Topical Anesthetic Applied: Other: lidocaine 4%, Treatment Notes Wound #2 (Left, Distal Lower Leg) 1. Cleansed with: Clean wound with Normal Saline 4. Dressing Applied: Silvadene Cream 5. Secondary Dressing Applied Gauze and Kerlix/Conform 7. Secured with [employer] TALTON, DELPRIORE ([id number]) Electronic Signature(s) Signed: 06/28/2015 3:11:22 PM By: Elpidio Eric BSN, RN Entered By: Elpidio Eric on 06/28/2015 15:11:22 [place]sip, [name] ([id number]) -------------------------------------------------------------------------------- Wound Assessment Details Patient Name: Sohn, Paola P. Date of Service: 06/28/2015 2:45 PM Medical Record Number: [id number] Patient Account Number: [id number] Date of Birth/Sex: Feb 13, 1961 (55 y.o. Male) Treating RN: Clover Mealy, RN, BSN,  Sink Primary Care Physician: Hillery Aldo Other Clinician: Referring Physician: Hillery Aldo Treating Physician/Extender: BURNS III, Regis Bill in Treatment: 2 Wound Status Wound Number: 3 Primary Diabetic Wound/Ulcer of the Lower Etiology: Extremity Wound Location: Left Toe Great Wound Open Wounding Event: Thermal Burn Status: Date Acquired:  05/29/2015 Comorbid Arrhythmia, Hypertension, Type II Weeks Of Treatment: 2 History: Diabetes, Neuropathy Clustered Wound: No Photos Photo Uploaded By: Elpidio Eric on 06/28/2015 16:22:54 Wound Measurements Length: (cm) 2.2 Width: (cm) 1.2 Depth: (cm) 0.1 Area: (cm) 2.073 Volume: (cm) 0.207 % Reduction in Area: 64.7% % Reduction in Volume: 64.7% Epithelialization: None Tunneling: No Undermining: No Wound Description Classification: Grade 1 Wound Margin: Distinct, outline attached Exudate  Amount: None Present Foul Odor After Cleansing: No Wound Bed Granulation Amount: None Present (0%) Exposed Structure Necrotic Amount: Large (67-100%) Fascia Exposed: No Necrotic Quality: Adherent Slough Fat Layer Exposed: No Tendon Exposed: No Muscle Exposed: No Joint Exposed: No Rozzell, Issac P. ([id number]) Bone Exposed: No Limited to Skin Breakdown Periwound Skin Texture Texture Color No Abnormalities Noted: No No Abnormalities Noted: No Callus: No Atrophie Blanche: No Crepitus: No Cyanosis: No Excoriation: No Ecchymosis: No Fluctuance: No Erythema: No Friable: No Hemosiderin Staining: No Induration: No Mottled: No Localized Edema: Yes Pallor: No Rash: No Rubor: No Scarring: No Temperature / Pain Moisture Temperature: No Abnormality No Abnormalities Noted: No Dry / Scaly: Yes Maceration: No Moist: No Wound Preparation Ulcer Cleansing: Rinsed/Irrigated with Saline Topical Anesthetic Applied: Other: lidocaine 4%, Treatment Notes Wound #3 (Left Toe Great) 1. Cleansed with: Clean wound with Normal Saline 4. Dressing Applied: Silvadene Cream 5. Secondary Dressing Applied Gauze and Kerlix/Conform 7. Secured with Tubig[employer] Electronic Signature(s) Signed: 06/28/2015 3:12:22 PM By: Elpidio Eric BSN, RN Entered By: Elpidio Eric on 06/28/2015 15:12:21 Hebb, [name] ([id number]) -------------------------------------------------------------------------------- Wound Assessment Details Patient Name: Jollie, Jaquese P. Date of Service: 06/28/2015 2:45 PM Medical Record Number: [id number] Patient Account Number: [id number] Date of Birth/Sex: 11-30-1960 (55 y.o. Male) Treating RN: Clover Mealy, RN, BSN, [place] Sink Primary Care Physician: Hillery Aldo Other Clinician: Referring Physician: Hillery Aldo Treating Physician/Extender: BURNS III, WALTER Weeks in Treatment: 2 Wound Status Wound Number: 4 Primary Diabetic Wound/Ulcer of the Lower Etiology: Extremity Wound Location: Left Toe  Second Wound Status: Healed - Epithelialized Wounding Event: Thermal Burn Date Acquired: 05/29/2015 Weeks Of Treatment: 2 Clustered Wound: No Photos Photo Uploaded By: Elpidio Eric on 06/28/2015 16:22:55 Wound Measurements Length: (cm) 0 % Reduction Width: (cm) 0 % Reduction Depth: (cm) 0 Area: (cm) 0 Volume: (cm) 0 in Area: 100% in Volume: 100% Wound Description Classification: Grade 1 Periwound Skin Texture Texture Color No Abnormalities Noted: No No Abnormalities Noted: No Moisture No Abnormalities Noted: No Electronic Signature(s) Signed: 06/28/2015 4:44:03 PM By: Elpidio Eric BSN, RN [place], Stephenson Demetrius Charity ([id number]) Entered By: Elpidio Eric on 06/28/2015 15:06:55 Licausi, [name] ([id number]) -------------------------------------------------------------------------------- Wound Assessment Details Patient Name: Hinderer, Camdan P. Date of Service: 06/28/2015 2:45 PM Medical Record Number: [id number] Patient Account Number: [id number] Date of Birth/Sex: 1961/02/15 (55 y.o. Male) Treating RN: Clover Mealy, RN, BSN,  Sink Primary Care Physician: Hillery Aldo Other Clinician: Referring Physician: Hillery Aldo Treating Physician/Extender: BURNS III, WALTER Weeks in Treatment: 2 Wound Status Wound Number: 5 Primary Diabetic Wound/Ulcer of the Lower Etiology: Extremity Wound Location: Left Toe Third Wound Status: Healed - Epithelialized Wounding Event: Thermal Burn Date Acquired: 05/29/2015 Weeks Of Treatment: 2 Clustered Wound: No Photos Photo Uploaded By: Elpidio Eric on 06/28/2015 16:22:55 Wound Measurements Length: (cm) 0 % Reduction i Width: (cm) 0 % Reduction i Depth: (cm) 0 Area: (cm) 0 Volume: (cm) 0 n Area: 100% n Volume: 100% Wound Description Classification: Grade 1 Periwound Skin Texture Texture Color No Abnormalities Noted: No No Abnormalities Noted: No Schum, Jabreel P. ([id number]) Moisture No Abnormalities Noted: No Electronic Signature(s) Signed: 06/28/2015 4:44:03  PM By: Elpidio Eric BSN, RN Entered By: Elpidio Eric on 06/28/2015 15:06:56 Okerlund, [name] ([id number]) -------------------------------------------------------------------------------- Wound Assessment Details Patient Name: Dicocco, Damarco P. Date of Service: 06/28/2015 2:45 PM Medical Record Number: [id number] Patient Account Number: [id number] Date of Birth/Sex: Feb 05, 1961 (55 y.o. Male) Treating RN: Clover Mealy, RN, BSN, [place] Sink Primary Care Physician: Hillery Aldo Other Clinician: Referring Physician: Hillery Aldo Treating Physician/Extender: BURNS III, WALTER Weeks in Treatment: 2 Wound Status Wound Number: 6 Primary Diabetic Wound/Ulcer of the Lower Etiology: Extremity Wound Location: Left Toe Fourth Wound Status: Healed - Epithelialized Wounding Event: Thermal Burn Date Acquired: 05/29/2015 Weeks Of Treatment: 2 Clustered Wound: No Photos Photo Uploaded By: Elpidio Eric on 06/28/2015 16:23:52 Wound Measurements Length: (cm) 0 % Reduction Width: (cm) 0 % Reduction Depth: (cm) 0 Area: (cm) 0 Volume: (cm) 0 in Area: 100% in Volume: 100% Wound Description Classification: Grade 1 Periwound Skin Texture Texture Color No Abnormalities Noted: No No Abnormalities Noted: No Moisture No Abnormalities Noted: No Electronic Signature(s) Signed: 06/28/2015 4:44:03 PM By: Elpidio Eric BSN, RN Vankirk, Jermine Demetrius Charity ([id number]) Entered By: Elpidio Eric on 06/28/2015 15:06:57 Stanko, [name] ([id number]) -------------------------------------------------------------------------------- Wound Assessment Details Patient Name: Quirk, Jahmier P. Date of Service: 06/28/2015 2:45 PM Medical Record Number: [id number] Patient Account Number: [id number] Date of Birth/Sex: [birth year] (55 y.o. Male) Treating RN: Clover Mealy, RN, BSN, [place] Sink Primary Care Physician: Hillery Aldo Other Clinician: Referring Physician: Hillery Aldo Treating Physician/Extender: BURNS III, WALTER Weeks in Treatment: 2 Wound Status Wound Number: 7  Primary Diabetic Wound/Ulcer of the Lower Etiology: Extremity Wound Location: Right Toe Second Wound Open Wounding Event: Thermal Burn Status: Date Acquired: 05/29/2015 Comorbid Arrhythmia, Hypertension, Type II Weeks Of Treatment: 2 History: Diabetes, Neuropathy Clustered Wound: No Photos Photo Uploaded By: Elpidio Eric on 06/28/2015 16:23:52 Wound Measurements Length: (cm) 1.8 Width: (cm) 0.4 Depth: (cm) 0.1 Area: (cm) 0.565 Volume: (cm) 0.057 % Reduction in Area: 10% % Reduction in Volume: 9.5% Epithelialization: None Tunneling: No Undermining: No Wound Description Classification: Grade 1 Wound Margin: Distinct, outline attached Exudate Amount: None Present Foul Odor After Cleansing: No Wound Bed Granulation Amount: None Present (0%) Exposed Structure Necrotic Amount: Large (67-100%) Fascia Exposed: No Necrotic Quality: Eschar Fat Layer Exposed: No Tendon Exposed: No Muscle Exposed: No Joint Exposed: No Nath, Zaidyn P. ([id number]) Bone Exposed: No Limited to Skin Breakdown Periwound Skin Texture Texture Color No Abnormalities Noted: No No Abnormalities Noted: No Callus: No Atrophie Blanche: No Crepitus: No Cyanosis: No Excoriation: No Ecchymosis: No Fluctuance: No Erythema: No Friable: No Hemosiderin Staining: No Induration: No Mottled: No Localized Edema: No Pallor: No Rash: No Rubor: No Scarring: No Temperature / Pain Moisture Temperature: No Abnormality No Abnormalities Noted: No Dry / Scaly: Yes Maceration: No Moist: No Wound Preparation Ulcer Cleansing: Rinsed/Irrigated with Saline Topical Anesthetic Applied: Other: LIDOCAINE 4%, Treatment Notes Wound #7 (Right Toe Second) 1. Cleansed with: Clean wound with Normal Saline 4. Dressing Applied: Silvadene Cream 5. Secondary Dressing Applied Gauze and Kerlix/Conform 7. Secured with [employer] Electronic Signature(s) Signed: 06/28/2015 3:13:01 PM By: Elpidio Eric BSN, RN Entered By:  Elpidio Eric on 06/28/2015 15:13:01 Fultz, [name] ([id number]) -------------------------------------------------------------------------------- Wound Assessment Details Patient Name: Hedges, Orvile P. Date of Service: 06/28/2015 2:45 PM Medical Record Number: [id number] Patient Account Number: [id number] Date of Birth/Sex: 23-Aug-1960 (55 y.o. Male) Treating RN: Clover Mealy, RN, BSN, [place] Sink Primary Care Physician: Hillery Aldo Other Clinician: Referring Physician: Hillery Aldo Treating Physician/Extender: BURNS III, Regis Bill in Treatment: 2 Wound Status Wound Number: 8 Primary Diabetic Wound/Ulcer of  the Lower Etiology: Extremity Wound Location: Right Toe Third Wound Open Wounding Event: Thermal Burn Status: Date Acquired: 05/29/2015 Comorbid Arrhythmia, Hypertension, Type II Weeks Of Treatment: 2 History: Diabetes, Neuropathy Clustered Wound: No Photos Photo Uploaded By: Elpidio Eric on 06/28/2015 16:24:27 Wound Measurements Length: (cm) Width: (cm) Depth: (cm) Area: (cm) Volume: (cm) 0 % Reduction in Area: 100% 0 % Reduction in Volume: 100% 0 Epithelialization: Large (67-100%) 0 Tunneling: No 0 Undermining: No Wound Description Classification: Grade 1 Wound Margin: Distinct, outline attached Exudate Amount: None Present Foul Odor After Cleansing: No Wound Bed Granulation Amount: None Present (0%) Exposed Structure Necrotic Amount: None Present (0%) Fascia Exposed: No Fat Layer Exposed: No Tendon Exposed: No Muscle Exposed: No Joint Exposed: No [place]oway, Taren P. ([id number]) Bone Exposed: No Limited to Skin Breakdown Periwound Skin Texture Texture Color No Abnormalities Noted: No No Abnormalities Noted: No Callus: No Atrophie Blanche: No Crepitus: No Cyanosis: No Excoriation: No Ecchymosis: No Fluctuance: No Erythema: No Friable: No Hemosiderin Staining: No Induration: No Mottled: No Localized Edema: No Pallor: No Rash: No Rubor: No Scarring: No Temperature  / Pain Moisture Temperature: No Abnormality No Abnormalities Noted: No Dry / Scaly: Yes Maceration: No Moist: No Wound Preparation Ulcer Cleansing: Rinsed/Irrigated with Saline Topical Anesthetic Applied: None Treatment Notes Wound #8 (Right Toe Third) 1. Cleansed with: Clean wound with Normal Saline 4. Dressing Applied: Silvadene Cream 5. Secondary Dressing Applied Gauze and Kerlix/Conform 7. Secured with Tubigrip Electronic Signature(s) Signed: 06/28/2015 3:13:38 PM By: Elpidio Eric BSN, RN Entered By: Elpidio Eric on 06/28/2015 15:13:37 Joe, [name] ([id number]) -------------------------------------------------------------------------------- Wound Assessment Details Patient Name: Schippers, Anselm P. Date of Service: 06/28/2015 2:45 PM Medical Record Number: [id number] Patient Account Number: [id number] Date of Birth/Sex: 12/04/1960 (55 y.o. Male) Treating RN: Clover Mealy, RN, BSN, [place] Sink Primary Care Physician: Hillery Aldo Other Clinician: Referring Physician: Hillery Aldo Treating Physician/Extender: BURNS III, WALTER Weeks in Treatment: 2 Wound Status Wound Number: 9 Primary Diabetic Wound/Ulcer of the Lower Etiology: Extremity Wound Location: Right Toe Fourth Wound Open Wounding Event: Thermal Burn Status: Date Acquired: 05/28/2015 Comorbid Arrhythmia, Hypertension, Type II Weeks Of Treatment: 2 History: Diabetes, Neuropathy Clustered Wound: No Photos Photo Uploaded By: Elpidio Eric on 06/28/2015 16:25:21 Wound Measurements Length: (cm) 1 Width: (cm) 0.2 Depth: (cm) 0.1 Area: (cm) 0.157 Volume: (cm) 0.016 % Reduction in Area: 61.5% % Reduction in Volume: 61% Epithelialization: None Tunneling: No Undermining: No Wound Description Classification: Grade 1 Wound Margin: Distinct, outline attached Exudate Amount: None Present Foul Odor After Cleansing: No Wound Bed Granulation Amount: None Present (0%) Exposed Structure Necrotic Amount: Large (67-100%) Fascia  Exposed: No Necrotic Quality: Eschar Fat Layer Exposed: No Tendon Exposed: No Muscle Exposed: No Joint Exposed: No Bachar, Zackerie P. ([id number]) Bone Exposed: No Limited to Skin Breakdown Periwound Skin Texture Texture Color No Abnormalities Noted: No No Abnormalities Noted: No Callus: No Atrophie Blanche: No Crepitus: No Cyanosis: No Excoriation: No Ecchymosis: No Fluctuance: No Erythema: No Friable: No Hemosiderin Staining: No Induration: No Mottled: No Localized Edema: No Pallor: No Rash: No Rubor: No Scarring: No Temperature / Pain Moisture Temperature: No Abnormality No Abnormalities Noted: No Dry / Scaly: Yes Maceration: No Moist: No Wound Preparation Ulcer Cleansing: Rinsed/Irrigated with Saline Topical Anesthetic Applied: Other: lidocaine 4%, Treatment Notes Wound #9 (Right Toe Fourth) 1. Cleansed with: Clean wound with Normal Saline 4. Dressing Applied: Silvadene Cream 5. Secondary Dressing Applied Gauze and Kerlix/Conform 7. Secured with [employer] aid/development worker) Signed: 06/28/2015 3:14:11 PM By: Elpidio Eric BSN, RN Entered  By: Elpidio EricAfful, [name] on 06/28/2015 15:14:11 Remsen, Laren EvertsJIM P. ([id number]) -------------------------------------------------------------------------------- Vitals Details Patient Name: Robers, Wing P. Date of Service: 06/28/2015 2:45 PM Medical Record Number: [id number] Patient Account Number: [id number] Date of Birth/Sex: February 24, 1961 [age] y.o. Male) Treating RN: Clover MealyAfful, RN, BSN, [name] Primary Care Physician: Hillery AldoPATEL, SARAH Other Clinician: Referring Physician: Hillery AldoPATEL, SARAH Treating Physician/Extender: BURNS III, Regis BillWALTER Weeks in Treatment: 2 Vital Signs Time Taken: 14:54 Temperature (F): 98.2 Height (in): 75 Pulse (bpm): 77 Weight (lbs): 321 Respiratory Rate (breaths/min): 20 Body Mass Index (BMI): 40.1 Blood Pressure (mmHg): 120/73 Reference Range: 80 - 120 mg / dl Electronic Signature(s) Signed: 06/28/2015 4:44:03 PM By: Elpidio EricAfful,  [name] BSN, RN Entered By: Elpidio EricAfful, [name] on 06/28/2015 14:55:00

## 2015-07-05 ENCOUNTER — Encounter: Payer: Medicare HMO | Admitting: Surgery

## 2015-07-05 DIAGNOSIS — E11622 Type 2 diabetes mellitus with other skin ulcer: Secondary | ICD-10-CM | POA: Diagnosis not present

## 2015-07-06 NOTE — Progress Notes (Addendum)
KENSHIN, SPLAWN ([id number]) Visit Report for 07/05/2015 Arrival Information Details Patient Name: [name], [name]. Date of Service: 07/05/2015 3:45 PM Medical Record Number: [id number] Patient Account Number: [id number] Date of Birth/Sex: 10-15-1960 (55 y.o. Male) Treating RN: Curtis Sites Primary Care Physician: Hillery Aldo Other Clinician: Referring Physician: Hillery Aldo Treating Physician/Extender: BURNS III, Regis Bill in Treatment: 3 Visit Information History Since Last Visit Added or deleted any medications: No Patient Arrived: Ambulatory Any new allergies or adverse reactions: No Arrival Time: 15:44 Had a fall or experienced change in No Accompanied By: self activities of daily living that may affect Transfer Assistance: None risk of falls: Patient Identification Verified: Yes Signs or symptoms of abuse/neglect since last No Secondary Verification Process Yes visito Completed: Hospitalized since last visit: No Patient Requires Transmission- No Pain Present Now: No Based Precautions: Patient Has Alerts: Yes Patient Alerts: Patient on Blood Thinner plavix, DM 2 ABI [place] BILATERAL >220 Electronic Signature(s) Signed: 07/05/2015 5:21:59 PM By: Curtis Sites Entered By: Curtis Sites on 07/05/2015 15:45:42 Zimmerle, [name] ([id number]) -------------------------------------------------------------------------------- Encounter Discharge Information Details Patient Name: [name], [name] Date of Service: 07/05/2015 3:45 PM Medical Record Number: [id number] Patient Account Number: [id number] Date of Birth/Sex: 1961/06/14 (55 y.o. Male) Treating RN: Curtis Sites Primary Care Physician: Hillery Aldo Other Clinician: Referring Physician: Hillery Aldo Treating Physician/Extender: BURNS III, Regis Bill in Treatment: 3 Encounter Discharge Information Items Discharge Pain Level: 0 Discharge Condition: Stable Ambulatory Status: Ambulatory Discharge Destination:  Home Transportation: Private Auto Accompanied By: self Schedule Follow-up Appointment: Yes Medication Reconciliation completed and provided to Patient/Care No Dorthy Magnussen: Provided on Clinical Summary of Care: 07/05/2015 Form Type Recipient Paper Patient JC Electronic Signature(s) Signed: 07/05/2015 4:28:14 PM By: Gwenlyn Perking Entered By: Gwenlyn Perking on 07/05/2015 16:28:14 Pallas, [name] ([id number]) -------------------------------------------------------------------------------- Lower Extremity Assessment Details Patient Name: [name], [name] Date of Service: 07/05/2015 3:45 PM Medical Record Number: [id number] Patient Account Number: [id number] Date of Birth/Sex: December 11, 1960 (55 y.o. Male) Treating RN: Curtis Sites Primary Care Physician: Hillery Aldo Other Clinician: Referring Physician: Hillery Aldo Treating Physician/Extender: BURNS III, Regis Bill in Treatment: 3 Edema Assessment Assessed: [Left: No] [Right: No] Edema: [Left: Yes] [Right: Yes] Vascular Assessment Pulses: Posterior Tibial Dorsalis Pedis Palpable: [Left:Yes] [Right:Yes] Extremity colors, hair growth, and conditions: Extremity Color: [Left:Hyperpigmented] [Right:Hyperpigmented] Hair Growth on Extremity: [Left:No] [Right:No] Temperature of Extremity: [Left:Warm] [Right:Warm] Capillary Refill: [Left:< 3 seconds] [Right:< 3 seconds] Electronic Signature(s) Signed: 07/05/2015 4:02:22 PM By: Curtis Sites Entered By: Curtis Sites on 07/05/2015 16:02:21 Bulson, [name] ([id number]) -------------------------------------------------------------------------------- Multi Wound Chart Details Patient Name: [name], [name] Date of Service: 07/05/2015 3:45 PM Medical Record Number: [id number] Patient Account Number: [id number] Date of Birth/Sex: [birth year] (56 y.o. 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] [3:Diabetic Wound/Ulcer of the Lower Extremity] Comorbid History: [10:Arrhythmia, Hypertension, N/A Type II Diabetes, Neuropathy] [3:N/A] Date Acquired: [10:05/29/2015] [2:05/29/2015] [3:05/29/2015] Weeks of Treatment: [10:3] [2:3] [3:3] Wound Status: [10:Open] [2:Open] [3:Open] Measurements L x W x D 3.2x0.9x0.1 [2:0x0x0] [3:0x0x0] (cm) Area (cm) : [10:2.262] [2:0] [3:0] Volume (cm) : [  10:0.226] [2:0] [3:0] % Reduction in Area: [10:61.60%] [2:100.00%] [3:100.00%] % Reduction in Volume: 61.60% [2:100.00%] [3:100.00%] Classification: [10:Grade 1] [2:Full Thickness Without Exposed Support Structures] [3:Grade 1] Exudate Amount: [10:Small] [2:N/A] [3:N/A] Exudate Type: [10:Serosanguineous] [2:N/A] [3:N/A] Exudate Color: [10:red, brown] [2:N/A] [3:N/A] Wound Margin: [10:Distinct, outline attached N/A] [3:N/A] Granulation Amount: [10:Large (67-100%)] [2:N/A] [3:N/A] Granulation Quality: [10:Pink, Pale] [2:N/A] [3:N/A] Necrotic Amount: [10:Small (1-33%)] [2:N/A] [3:N/A] Necrotic Tissue: [10:Adherent Slough] [2:N/A] [3:N/A] Exposed Structures: [10:Fascia: No Fat: No] [2:N/A] [3:N/A] Tendon: No Muscle: No Joint: No Bone: No Limited to Skin Breakdown Epithelialization: None N/A N/A Periwound Skin Texture: Edema: Yes No Abnormalities Noted No Abnormalities Noted Excoriation: No Induration: No Callus: No Crepitus: No Fluctuance: No Friable: No Rash: No Scarring: No Periwound Skin  Moist: Yes No Abnormalities Noted No Abnormalities Noted Moisture: Maceration: No Dry/Scaly: No Periwound Skin Color: Atrophie Blanche: No No Abnormalities Noted No Abnormalities Noted Cyanosis: No Ecchymosis: No Erythema: No Hemosiderin Staining: No Mottled: No Pallor: No Rubor: No Temperature: No Abnormality N/A N/A Tenderness on No No No Palpation: Wound Preparation: Ulcer Cleansing: N/A N/A Rinsed/Irrigated with Saline Topical Anesthetic Applied: Other: lidocaine 4% Wound Number: 7 [id number]  Photos: No Photos No Photos No Photos Wound Location: Right Toe Second Right Toe Third Right Toe Fourth Wounding Event: Thermal Burn Thermal Burn Thermal Burn Primary Etiology: Diabetic Wound/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 the Lower Extremity the Lower Extremity Comorbid History: Arrhythmia, Hypertension, N/A Arrhythmia, Hypertension, Type II Diabetes, Type II Diabetes, Neuropathy Neuropathy Date Acquired: 05/29/2015 05/29/2015 05/28/2015 Weeks of Treatment: 3 3 3  Wound Status: Open Healed - Epithelialized Open [name], [name] ([id number]) Measurements L x W x D 1.5x0.3x0.1 0x0x0 1x0.2x0.1 (cm) Area (cm) : 0.353 0 0.157 Volume (cm) : 0.035 0 0.016 % Reduction in Area: 43.80% 100.00% 61.50% % Reduction in Volume: 44.40% 100.00% 61.00% Classification: Grade 1 Grade 1 Grade 1 Exudate Amount: None Present N/A None Present Exudate Type: N/A N/A N/A Exudate Color: N/A N/A N/A Wound Margin: Distinct, outline attached N/A Distinct, outline attached Granulation Amount: None Present (0%) N/A None Present (0%) Granulation Quality: N/A N/A N/A Necrotic Amount: Large (67-100%) N/A Large (67-100%) Necrotic Tissue: Eschar N/A Eschar Exposed Structures: Fascia: No N/A Fascia: No Fat: No Fat: No Tendon: No Tendon: No Muscle: No Muscle: No Joint: No Joint: No Bone: No Bone: No Limited to Skin Limited to Skin Breakdown  Breakdown Epithelialization: None N/A None Periwound Skin Texture: Edema: No No Abnormalities Noted Edema: No Excoriation: No Excoriation: No Induration: No Induration: No Callus: No Callus: No Crepitus: No Crepitus: No Fluctuance: No Fluctuance: No Friable: No Friable: No Rash: No Rash: No Scarring: No Scarring: No Periwound Skin Dry/Scaly: Yes No Abnormalities Noted Dry/Scaly: Yes Moisture: Maceration: No Maceration: No Moist: No Moist: No Periwound Skin Color: Atrophie Blanche: No No Abnormalities Noted Atrophie Blanche: No Cyanosis: No Cyanosis: No Ecchymosis: No Ecchymosis: No Erythema: No Erythema: No Hemosiderin Staining: No Hemosiderin Staining: No Mottled: No Mottled: No Pallor: No Pallor: No Rubor: No Rubor: No Temperature: No Abnormality N/A No Abnormality Tenderness on No No No Palpation: Wound Preparation: Ulcer Cleansing: N/A Ulcer Cleansing: Rinsed/Irrigated with Rinsed/Irrigated with Saline Saline [name], [name] ([id number]) Topical Anesthetic Topical Anesthetic Applied: None Applied: None Treatment Notes Electronic Signature(s) Signed: 07/05/2015 4:02:36 PM By: Curtis Sites Entered By: Curtis Sites on 07/05/2015 16:02:35 Jaffe, [name] ([id number]) -------------------------------------------------------------------------------- Multi-Disciplinary Care Plan Details Patient Name: [name]. Date of Service: 07/05/2015 3:45 PM Medical Record Number: [id number] Patient  Account Number: [id number] Date of Birth/Sex: 1961/04/30 7(54 y.o. Male) Treating RN: Curtis Sitesorthy, Joanna Primary Care Physician: Hillery AldoPATEL, SARAH Other Clinician: Referring Physician: Hillery AldoPATEL, SARAH Treating Physician/Extender: BURNS III, Regis BillWALTER Weeks in Treatment: 3 Active Inactive Orientation to the Wound Care Program Nursing Diagnoses: Knowledge deficit related to the wound healing center program Goals: Patient/caregiver will verbalize understanding of the Wound Healing  Center Program Date Initiated: 06/08/2015 Goal Status: Active Interventions: Provide education on orientation to the wound center Notes: Wound/Skin Impairment Nursing Diagnoses: Impaired tissue integrity Knowledge deficit related to ulceration/compromised skin integrity Goals: Patient/caregiver will verbalize understanding of skin care regimen Date Initiated: 06/08/2015 Goal Status: Active Ulcer/skin breakdown will have a volume reduction of 30% by week 4 Date Initiated: 06/08/2015 Goal Status: Active Ulcer/skin breakdown will have a volume reduction of 50% by week 8 Date Initiated: 06/08/2015 Goal Status: Active Ulcer/skin breakdown will have a volume reduction of 80% by week 12 Date Initiated: 06/08/2015 Goal Status: Active Ulcer/skin breakdown will heal within 14 weeks Date Initiated: 06/08/2015 Hilliard ClarkCAHILL, Jamare P. ([id number]) Goal Status: Active Interventions: Assess patient/caregiver ability to obtain necessary supplies Assess patient/caregiver ability to perform ulcer/skin care regimen upon admission and as needed Assess ulceration(s) every visit Provide education on ulcer and skin care Notes: Electronic Signature(s) Signed: 07/06/2015 1:56:04 PM By: Curtis Sitesorthy, Joanna Previous Signature: 07/05/2015 4:02:28 PM Version By: Curtis Sitesorthy, Joanna Entered By: Curtis Sitesorthy, Joanna on 07/06/2015 13:56:04 Hogland, [name] EvertsJIM P. ([id number]) -------------------------------------------------------------------------------- Patient/Caregiver Education Details Patient Name: Hilliard ClarkAHILL, Wadell P. Date of Service: 07/05/2015 3:45 PM Medical Record Number: [id number] Patient Account Number: [id number] Date of Birth/Gender: 1961/04/30 38(54 y.o. Male) Treating RN: Curtis Sitesorthy, Joanna Primary Care Physician: Hillery AldoPATEL, SARAH Other Clinician: Referring Physician: Hillery AldoPATEL, SARAH Treating Physician/Extender: BURNS III, Regis BillWALTER Weeks in Treatment: 3 Education Assessment Education Provided To: Patient Education Topics  Provided Wound/Skin Impairment: Handouts: Other: wound care as ordered Methods: Demonstration, Explain/Verbal Responses: State content correctly Electronic Signature(s) Signed: 07/05/2015 5:21:59 PM By: Curtis Sitesorthy, Joanna Entered By: Curtis Sitesorthy, Joanna on 07/05/2015 16:31:15 Levan, [name] EvertsJIM P. ([id number]) -------------------------------------------------------------------------------- Wound Assessment Details Patient Name: Ellerson, Angelgabriel P. Date of Service: 07/05/2015 3:45 PM Medical Record Number: [id number] Patient Account Number: [id number] Date of Birth/Sex: 1961/04/30 28(54 y.o. Male) Treating RN: Curtis Sitesorthy, Joanna Primary Care Physician: Hillery AldoPATEL, SARAH Other Clinician: Referring Physician: Hillery AldoPATEL, SARAH Treating Physician/Extender: BURNS III, WALTER Weeks in Treatment: 3 Wound Status Wound Number: 10 Primary Diabetic Wound/Ulcer of the Lower Etiology: Extremity Wound Location: Left Lower Leg - Lateral Wound Open Wounding Event: Thermal Burn Status: Date Acquired: 05/29/2015 Comorbid Arrhythmia, Hypertension, Type II Weeks Of Treatment: 3 History: Diabetes, Neuropathy Clustered Wound: No Photos Photo Uploaded By: Curtis Sitesorthy, Joanna on 07/05/2015 16:41:57 Wound Measurements Length: (cm) 3.2 Width: (cm) 0.9 Depth: (cm) 0.1 Area: (cm) 2.262 Volume: (cm) 0.226 % Reduction in Area: 61.6% % Reduction in Volume: 61.6% Epithelialization: None Tunneling: No Undermining: No Wound Description Classification: Grade 1 Wound Margin: Distinct, outline attached Exudate Amount: Small Exudate Type: Serosanguineous Exudate Color: red, brown Foul Odor After Cleansing: No Wound Bed Granulation Amount: Large (67-100%) Exposed Structure Granulation Quality: Pink, Pale Fascia Exposed: No Necrotic Amount: Small (1-33%) Fat Layer Exposed: No Necrotic Quality: Adherent Slough Tendon Exposed: No Mccune, Rudell P. ([id number]) Muscle Exposed: No Joint Exposed: No Bone Exposed: No Limited to Skin  Breakdown Periwound Skin Texture Texture Color No Abnormalities Noted: No No Abnormalities Noted: No Callus: No Atrophie Blanche: No Crepitus: No Cyanosis: No Excoriation: No Ecchymosis: No Fluctuance: No Erythema: No Friable: No Hemosiderin Staining: No Induration: No Mottled: No Localized Edema: Yes Pallor: No  Rash: No Rubor: No Scarring: No Temperature / Pain Moisture Temperature: No Abnormality No Abnormalities Noted: No Dry / Scaly: No Maceration: No Moist: Yes Wound Preparation Ulcer Cleansing: Rinsed/Irrigated with Saline Topical Anesthetic Applied: Other: lidocaine 4%, Treatment Notes Wound #10 (Left, Lateral Lower Leg) 1. Cleansed with: Clean wound with Normal Saline 2. Anesthetic Topical Lidocaine 4% cream to wound bed prior to debridement 4. Dressing Applied: Silvadene Cream 5. Secondary Dressing Applied Gauze and Kerlix/Conform 7. Secured with Tape Tubigrip Electronic Signature(s) Signed: 07/05/2015 5:21:59 PM By: Curtis Sites Entered By: Curtis Sites on 07/05/2015 16:00:53 Nau, [name] ([id number]) -------------------------------------------------------------------------------- Wound Assessment Details Patient Name: [name], [name] Date of Service: 07/05/2015 3:45 PM Medical Record Number: [id number] Patient Account Number: [id number] Date of Birth/Sex: August 18, 1960 (55 y.o. Male) Treating RN: Curtis Sites Primary Care Physician: Hillery Aldo Other Clinician: Referring Physician: Hillery Aldo Treating Physician/Extender: BURNS III, Regis Bill in Treatment: 3 Wound Status Wound Number: 2 Primary 2nd degree Burn Etiology: Wound Location: Left Lower Leg - Distal Wound Open Wounding Event: Thermal Burn Status: Date Acquired: 05/29/2015 Comorbid Arrhythmia, Hypertension, Type II Weeks Of Treatment: 3 History: Diabetes, Neuropathy Clustered Wound: No Photos Photo Uploaded By: Curtis Sites on 07/05/2015 16:41:58 Wound  Measurements Length: (cm) 0.1 Width: (cm) 0.1 Depth: (cm) 0.1 Area: (cm) 0.008 Volume: (cm) 0.001 % Reduction in Area: 100% % Reduction in Volume: 100% Epithelialization: Medium (34-66%) Tunneling: No Undermining: No Wound Description Full Thickness Without Exposed Foul Odor Classification: Support Structures Diabetic Severity Grade 1 (Wagner): Wound Margin: Distinct, outline attached Exudate Amount: Small Exudate Type: Serosanguineous Exudate Color: red, brown After Cleansing: No Wound Bed Granulation Amount: Large (67-100%) Exposed Structure Granulation Quality: Pink Fascia Exposed: No [name], [name] ([id number]) Necrotic Amount: None Present (0%) Fat Layer Exposed: No Tendon Exposed: No Muscle Exposed: No Joint Exposed: No Bone Exposed: No Limited to Skin Breakdown Periwound Skin Texture Texture Color No Abnormalities Noted: No No Abnormalities Noted: No Callus: No Atrophie Blanche: No Crepitus: No Cyanosis: No Excoriation: No Ecchymosis: No Fluctuance: No Erythema: No Friable: No Hemosiderin Staining: No Induration: No Mottled: No Localized Edema: Yes Pallor: No Rash: No Rubor: No Scarring: No Temperature / Pain Moisture Temperature: No Abnormality No Abnormalities Noted: No Dry / Scaly: No Maceration: No Moist: Yes Wound Preparation Ulcer Cleansing: Rinsed/Irrigated with Saline Topical Anesthetic Applied: Other: lidocaine 4%, Treatment Notes Wound #2 (Left, Distal Lower Leg) 1. Cleansed with: Clean wound with Normal Saline 2. Anesthetic Topical Lidocaine 4% cream to wound bed prior to debridement 4. Dressing Applied: Silvadene Cream 5. Secondary Dressing Applied Gauze and Kerlix/Conform 7. Secured with Tape Tubigrip Electronic Signature(s) Signed: 07/05/2015 5:21:59 PM By: Curtis Sites Entered By: Curtis Sites on 07/05/2015 16:06:07 Moch, [name] ([id number]) Grizzle, [name]  ([id number]) -------------------------------------------------------------------------------- Wound Assessment Details Patient Name: [name], [name] Date of Service: 07/05/2015 3:45 PM Medical Record Number: [id number] Patient Account Number: [id number] Date of Birth/Sex: 05-27-1961 (55 y.o. Male) Treating RN: Curtis Sites Primary Care Physician: Hillery Aldo Other Clinician: Referring Physician: Hillery Aldo Treating Physician/Extender: BURNS III, WALTER Weeks in Treatment: 3 Wound Status Wound Number: 3 Primary Diabetic Wound/Ulcer of the Lower Etiology: Extremity Wound Location: Left Toe Great Wound Status: Open Wounding Event: Thermal Burn Date Acquired: 05/29/2015 Weeks Of Treatment: 3 Clustered Wound: No Photos Photo Uploaded By: Curtis Sites on 07/05/2015 16:42:31 Wound Measurements Length: (cm) 0.1 Width: (cm) 0.1 Depth: (cm) 0.1 Area: (cm) 0.008 Volume: (cm) 0.001 % Reduction in Area: 99.9% % Reduction in Volume: 99.8% Wound Description Classification:  Grade 1 Periwound Skin Texture Texture Color No Abnormalities Noted: No No Abnormalities Noted: No Moisture No Abnormalities Noted: No Treatment Notes Wound #3 (Left Toe Great) 1. Cleansed with: Bowns, Tri P. ([id number]) Clean wound with Normal Saline 2. Anesthetic Topical Lidocaine 4% cream to wound bed prior to debridement 4. Dressing Applied: Silvadene Cream 5. Secondary Dressing Applied Gauze and Kerlix/Conform 7. Secured with Tape Tubigrip Electronic Signature(s) Signed: 07/05/2015 5:21:59 PM By: Curtis Sites Entered By: Curtis Sites on 07/05/2015 16:08:31 Vicens, [name] ([id number]) -------------------------------------------------------------------------------- Wound Assessment Details Patient Name: [name], [name] Date of Service: 07/05/2015 3:45 PM Medical Record Number: [id number] Patient Account Number: [id number] Date of Birth/Sex: September 15, 1960 (55 y.o. Male) Treating RN: Curtis Sites Primary Care Physician: Hillery Aldo Other Clinician: Referring Physician: Hillery Aldo Treating Physician/Extender: BURNS III, WALTER Weeks in Treatment: 3 Wound Status Wound Number: 7 Primary Diabetic Wound/Ulcer of the Lower Etiology: Extremity Wound Location: Right Toe Second Wound Open Wounding Event: Thermal Burn Status: Date Acquired: 05/29/2015 Comorbid Arrhythmia, Hypertension, Type II Weeks Of Treatment: 3 History: Diabetes, Neuropathy Clustered Wound: No Photos Photo Uploaded By: Curtis Sites on 07/05/2015 16:42:32 Wound Measurements Length: (cm) 1.5 Width: (cm) 0.3 Depth: (cm) 0.1 Area: (cm) 0.353 Volume: (cm) 0.035 % Reduction in Area: 43.8% % Reduction in Volume: 44.4% Epithelialization: None Tunneling: No Undermining: No Wound Description Classification: Grade 1 Wound Margin: Distinct, outline attached Exudate Amount: None Present Foul Odor After Cleansing: No Wound Bed Granulation Amount: None Present (0%) Exposed Structure Necrotic Amount: Large (67-100%) Fascia Exposed: No Necrotic Quality: Eschar Fat Layer Exposed: No Tendon Exposed: No Muscle Exposed: No Joint Exposed: No [name], [name] ([id number]) Bone Exposed: No Limited to Skin Breakdown Periwound Skin Texture Texture Color No Abnormalities Noted: No No Abnormalities Noted: No Callus: No Atrophie Blanche: No Crepitus: No Cyanosis: No Excoriation: No Ecchymosis: No Fluctuance: No Erythema: No Friable: No Hemosiderin Staining: No Induration: No Mottled: No Localized Edema: No Pallor: No Rash: No Rubor: No Scarring: No Temperature / Pain Moisture Temperature: No Abnormality No Abnormalities Noted: No Dry / Scaly: Yes Maceration: No Moist: No Wound Preparation Ulcer Cleansing: Rinsed/Irrigated with Saline Topical Anesthetic Applied: None Treatment Notes Wound #7 (Right Toe Second) 1. Cleansed with: Clean wound with Normal Saline 2. Anesthetic Topical  Lidocaine 4% cream to wound bed prior to debridement 4. Dressing Applied: Silvadene Cream 5. Secondary Dressing Applied Gauze and Kerlix/Conform 7. Secured with Tape Tubigrip Electronic Signature(s) Signed: 07/05/2015 4:01:42 PM By: Curtis Sites Entered By: Curtis Sites on 07/05/2015 16:01:42 Oblinger, [name] ([id number]) -------------------------------------------------------------------------------- Wound Assessment Details Patient Name: [name], [name] Date of Service: 07/05/2015 3:45 PM Medical Record Number: [id number] Patient Account Number: [id number] Date of Birth/Sex: [birth year] (55 y.o. Male) Treating RN: Curtis Sites Primary Care Physician: Hillery Aldo Other Clinician: Referring Physician: Hillery Aldo Treating Physician/Extender: BURNS III, WALTER Weeks in Treatment: 3 Wound Status Wound Number: 8 Primary Diabetic Wound/Ulcer of the Lower Etiology: Extremity Wound Location: Right Toe Third Wound Status: Healed - Epithelialized Wounding Event: Thermal Burn Date Acquired: 05/29/2015 Weeks Of Treatment: 3 Clustered Wound: No Photos Photo Uploaded By: Curtis Sites on 07/05/2015 16:43:17 Wound Measurements Length: (cm) 0 % Reduction i Width: (cm) 0 % Reduction i Depth: (cm) 0 Area: (cm) 0 Volume: (cm) 0 n Area: 100% n Volume: 100% Wound Description Classification: Grade 1 Periwound Skin Texture Texture Color No Abnormalities Noted: No No Abnormalities Noted: No Moisture No Abnormalities Noted: No Electronic Signature(s) Signed: 07/05/2015 5:21:59 PM By: Rana Snare, Lucan Demetrius Charity ([id number])  Entered By: Curtis Sites on 07/05/2015 15:57:51 Neer, [name] ([id number]) -------------------------------------------------------------------------------- Wound Assessment Details Patient Name: [name], [name] Date of Service: 07/05/2015 3:45 PM Medical Record Number: [id number] Patient Account Number: [id number] Date of Birth/Sex: Nov 13, 1960 (55 y.o.  Male) Treating RN: Curtis Sites Primary Care Physician: Hillery Aldo Other Clinician: Referring Physician: Hillery Aldo Treating Physician/Extender: BURNS III, WALTER Weeks in Treatment: 3 Wound Status Wound Number: 9 Primary Diabetic Wound/Ulcer of the Lower Etiology: Extremity Wound Location: Right Toe Fourth Wound Open Wounding Event: Thermal Burn Status: Date Acquired: 05/28/2015 Comorbid Arrhythmia, Hypertension, Type II Weeks Of Treatment: 3 History: Diabetes, Neuropathy Clustered Wound: No Photos Photo Uploaded By: Curtis Sites on 07/05/2015 16:43:18 Wound Measurements Length: (cm) 1 Width: (cm) 0.2 Depth: (cm) 0.1 Area: (cm) 0.157 Volume: (cm) 0.016 % Reduction in Area: 61.5% % Reduction in Volume: 61% Epithelialization: None Tunneling: No Undermining: No Wound Description Classification: Grade 1 Wound Margin: Distinct, outline attached Exudate Amount: None Present Foul Odor After Cleansing: No Wound Bed Granulation Amount: None Present (0%) Exposed Structure Necrotic Amount: Large (67-100%) Fascia Exposed: No Necrotic Quality: Eschar Fat Layer Exposed: No Tendon Exposed: No Muscle Exposed: No Joint Exposed: No Mcconahy, Keijuan P. ([id number]) Bone Exposed: No Limited to Skin Breakdown Periwound Skin Texture Texture Color No Abnormalities Noted: No No Abnormalities Noted: No Callus: No Atrophie Blanche: No Crepitus: No Cyanosis: No Excoriation: No Ecchymosis: No Fluctuance: No Erythema: No Friable: No Hemosiderin Staining: No Induration: No Mottled: No Localized Edema: No Pallor: No Rash: No Rubor: No Scarring: No Temperature / Pain Moisture Temperature: No Abnormality No Abnormalities Noted: No Dry / Scaly: Yes Maceration: No Moist: No Wound Preparation Ulcer Cleansing: Rinsed/Irrigated with Saline Topical Anesthetic Applied: None Treatment Notes Wound #9 (Right Toe Fourth) 1. Cleansed with: Clean wound with Normal Saline 2.  Anesthetic Topical Lidocaine 4% cream to wound bed prior to debridement 4. Dressing Applied: Silvadene Cream 5. Secondary Dressing Applied Gauze and Kerlix/Conform 7. Secured with Tape Tubigrip Electronic Signature(s) Signed: 07/05/2015 4:01:58 PM By: Curtis Sites Entered By: Curtis Sites on 07/05/2015 16:01:57 January, [name] ([id number]) -------------------------------------------------------------------------------- Vitals Details Patient Name: Hackel, Mubarak P. Date of Service: 07/05/2015 3:45 PM Medical Record Number: [id number] Patient Account Number: [id number] Date of Birth/Sex: 06-02-1961 (55 y.o. Male) Treating RN: Curtis Sites Primary Care Physician: Hillery Aldo Other Clinician: Referring Physician: Hillery Aldo Treating Physician/Extender: BURNS III, Regis Bill in Treatment: 3 Vital Signs Time Taken: 15:48 Temperature (F): 98.5 Height (in): 75 Pulse (bpm): 120 Weight (lbs): 321 Respiratory Rate (breaths/min): 22 Body Mass Index (BMI): 40.1 Blood Pressure (mmHg): 118/75 Reference Range: 80 - 120 mg / dl Electronic Signature(s) Signed: 07/05/2015 5:21:59 PM By: Curtis Sites Entered By: Curtis Sites on 07/05/2015 15:48:07
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
## 2015-07-12 ENCOUNTER — Ambulatory Visit: Payer: Medicare HMO | Admitting: Surgery

## 2015-07-19 ENCOUNTER — Ambulatory Visit: Payer: Medicare HMO | Admitting: Surgery

## 2015-07-20 ENCOUNTER — Ambulatory Visit: Payer: Medicare HMO | Admitting: Surgery

## 2015-07-25 ENCOUNTER — Encounter: Payer: Medicare HMO | Admitting: Surgery

## 2015-07-25 DIAGNOSIS — E11622 Type 2 diabetes mellitus with other skin ulcer: Secondary | ICD-10-CM | POA: Diagnosis not present
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
## 2015-07-26 NOTE — Progress Notes (Addendum)
ASHFORD, CLOUSE ([id number]) Visit Report for 07/25/2015 Arrival Information Details Patient Name: [name], [name]. Date of Service: 07/25/2015 1:45 PM Medical Record Number: [id number] Patient Account Number: [id number] Date of Birth/Sex: Apr 15, 1961 (55 y.o. Male) Treating RN: Clover Mealy, RN, BSN, [place] Sink Primary Care Physician: Hillery Aldo Other Clinician: Referring Physician: Hillery Aldo Treating Physician/Extender: BURNS III, Regis Bill in Treatment: 6 Visit Information History Since Last Visit Added or deleted any medications: No Patient Arrived: Ambulatory Any new allergies or adverse reactions: No Arrival Time: 13:57 Had a fall or experienced change in No Accompanied By: self activities of daily living that may affect Transfer Assistance: None risk of falls: Patient Identification Verified: Yes Signs or symptoms of abuse/neglect since last No Secondary Verification Process Yes visito Completed: Hospitalized since last visit: No Patient Requires Transmission- No Has Dressing in Place as Prescribed: Yes Based Precautions: Pain Present Now: No Patient Has Alerts: Yes Patient Alerts: Patient on Blood Thinner plavix, DM 2 ABI [place] BILATERAL >220 Electronic Signature(s) Signed: 07/25/2015 4:04:36 PM By: Elpidio Eric BSN, RN Entered By: Elpidio Eric on 07/25/2015 13:58:13 [name], [name] ([id number]) -------------------------------------------------------------------------------- Clinic Level of Care Assessment Details Patient Name: [name], [name] Date of Service: 07/25/2015 1:45 PM Medical Record Number: [id number] Patient Account Number: [id number] Date of Birth/Sex: [birth year] (55 y.o. Male) Treating RN: Clover Mealy, RN, BSN, [name] Primary Care Physician: Hillery Aldo Other Clinician: Referring Physician: Hillery Aldo Treating Physician/Extender: Rudene Re in Treatment: 6 Clinic Level of Care Assessment Items TOOL 4 Quantity Score []  - Use when only an EandM is performed on  FOLLOW-UP visit 0 ASSESSMENTS - Nursing Assessment / Reassessment X - Reassessment of Co-morbidities (includes updates in patient status) 1 10 X - Reassessment of Adherence to Treatment Plan 1 5 ASSESSMENTS - Wound and Skin Assessment / Reassessment X - Simple Wound Assessment / Reassessment - one wound 1 5 []  - Complex Wound Assessment / Reassessment - multiple wounds 0 []  - Dermatologic / Skin Assessment (not related to wound area) 0 ASSESSMENTS - Focused Assessment []  - Circumferential Edema Measurements - multi extremities 0 []  - Nutritional Assessment / Counseling / Intervention 0 X - Lower Extremity Assessment (monofilament, tuning fork, pulses) 1 5 []  - Peripheral Arterial Disease Assessment (using hand held doppler) 0 ASSESSMENTS - Ostomy and/or Continence Assessment and Care []  - Incontinence Assessment and Management 0 []  - Ostomy Care Assessment and Management (repouching, etc.) 0 PROCESS - Coordination of Care X - Simple Patient / Family Education for ongoing care 1 15 []  - Complex (extensive) Patient / Family Education for ongoing care 0 []  - Haematologist obtains Chiropractor, Records, Test Results / Process Orders 0 []  - Staff telephones HHA, Nursing Homes / Clarify orders / etc 0 []  - Routine Transfer to another Facility (non-emergent condition) 0 Borgerding, Willford P. ([id number]) []  - Routine Hospital Admission (non-emergent condition) 0 []  - New Admissions / Manufacturing engineer / Ordering NPWT, Apligraf, etc. 0 []  - Emergency Hospital Admission (emergent condition) 0 []  - Simple Discharge Coordination 0 []  - Complex (extensive) Discharge Coordination 0 PROCESS - Special Needs []  - Pediatric / Minor Patient Management 0 []  - Isolation Patient Management 0 []  - Hearing / Language / Visual special needs 0 []  - Assessment of Community assistance (transportation, D/C planning, etc.) 0 []  - Additional assistance / Altered mentation 0 []  - Support Surface(s) Assessment (bed, cushion,  seat, etc.) 0 INTERVENTIONS - Wound Cleansing / Measurement X - Simple Wound Cleansing - one wound 1 5 []  - Complex  Wound Cleansing - multiple wounds 0 X - Wound Imaging (photographs - any number of wounds) 1 5  - Wound Tracing (instead of photographs) 0  - Simple Wound Measurement - one wound 0  - Complex Wound Measurement - multiple wounds 0 INTERVENTIONS - Wound Dressings X - Small Wound Dressing one or multiple wounds 1 10  - Medium Wound Dressing one or multiple wounds 0  - Large Wound Dressing one or multiple wounds 0  - Application of Medications - topical 0  - Application of Medications - injection 0 INTERVENTIONS - Miscellaneous  - External ear exam 0 Chaudhuri, Nguyen P. ([id number])  - Specimen Collection (cultures, biopsies, blood, body fluids, etc.) 0  - Specimen(s) / Culture(s) sent or taken to Lab for analysis 0  - Patient Transfer (multiple staff / Michiel Sites Lift / Similar devices) 0  - Simple Staple / Suture removal (25 or less) 0  - Complex Staple / Suture removal (26 or more) 0  - Hypo / Hyperglycemic Management (close monitor of Blood Glucose) 0  - Ankle / Brachial Index (ABI) - do not check if billed separately 0 X - Vital Signs 1 5 Has the patient been seen at the hospital within the last three years: Yes Total Score: 65 Level Of Care: New/Established - Level 2 Electronic Signature(s) Signed: 07/25/2015 4:04:36 PM By: Elpidio Eric BSN, RN Entered By: Elpidio Eric on 07/25/2015 14:24:35 Kaupp, [name] ([id number]) -------------------------------------------------------------------------------- Encounter Discharge Information Details Patient Name: [name], [name] Date of Service: 07/25/2015 1:45 PM Medical Record Number: [id number] Patient Account Number: [id number] Date of Birth/Sex: 1961/05/28 (55 y.o. Male) Treating RN: Clover Mealy, RN, BSN, [place] Sink Primary Care Physician: Hillery Aldo Other Clinician: Referring Physician: Hillery Aldo Treating  Physician/Extender: Rudene Re in Treatment: 6 Encounter Discharge Information Items Discharge Pain Level: 0 Discharge Condition: Stable Ambulatory Status: Ambulatory Discharge Destination: Home Transportation: Private Auto Accompanied By: self Schedule Follow-up Appointment: No Medication Reconciliation completed and provided to Patient/Care No Jana Swartzlander: Provided on Clinical Summary of Care: 07/25/2015 Form Type Recipient Paper Patient JC Electronic Signature(s) Signed: 07/25/2015 4:04:36 PM By: Elpidio Eric BSN, RN Previous Signature: 07/25/2015 2:22:23 PM Version By: Gwenlyn Perking Entered By: Elpidio Eric on 07/25/2015 14:22:33 Isidoro, [name] ([id number]) -------------------------------------------------------------------------------- Lower Extremity Assessment Details Patient Name: Stansbery, Nova P. Date of Service: 07/25/2015 1:45 PM Medical Record Number: [id number] Patient Account Number: [id number] Date of Birth/Sex: Jun 27, 1960 (55 y.o. Male) Treating RN: Clover Mealy, RN, BSN, [place] Sink Primary Care Physician: Hillery Aldo Other Clinician: Referring Physician: Hillery Aldo Treating Physician/Extender: BURNS III, Regis Bill in Treatment: 6 Vascular Assessment Pulses: Posterior Tibial Dorsalis Pedis Palpable: [Left:Yes] Extremity colors, hair growth, and conditions: Extremity Color: [Left:Hyperpigmented] Hair Growth on Extremity: [Left:No] Temperature of Extremity: [Left:Warm] Capillary Refill: [Left:< 3 seconds] Toe Nail Assessment Left: Right: Thick: No Discolored: No Deformed: No Improper Length and Hygiene: No Electronic Signature(s) Signed: 07/25/2015 4:04:36 PM By: Elpidio Eric BSN, RN Entered By: Elpidio Eric on 07/25/2015 14:02:23 Slezak, [name] ([id number]) -------------------------------------------------------------------------------- Multi Wound Chart Details Patient Name: Coby, Hiawatha P. Date of Service: 07/25/2015 1:45 PM Medical Record Number:  [id number] Patient Account Number: [id number] Date of Birth/Sex: 03/23/1961 (55 y.o. 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] [3:Diabetic Wound/Ulcer of the Lower Extremity] Comorbid History: [10:Arrhythmia, Hypertension, Arrhythmia, Hypertension, Arrhythmia, Hypertension, Type II Diabetes, Neuropathy] [2:Type II Diabetes, Neuropathy] [3:Type II Diabetes, Neuropathy] Date Acquired: [10:05/29/2015] [2:05/29/2015] [3:05/29/2015] Weeks of Treatment: [10:6] [2:6] [3:6] Wound Status: [10:Open] [2:Healed - Epithelialized] [3:Healed - Epithelialized] Measurements L x W x D 0.4x0.3x0.1 [2:0x0x0] [3:0x0x0] (cm) Area (cm) : [10:0.094] [2:0] [3:0] Volume (cm) : [10:0.009] [2:0] [3:0] % Reduction in Area: [10:98.40%] [2:100.00%] [3:100.00%] % Reduction in Volume: 98.50% [2:100.00%] [3:100.00%] Classification: [10:Grade 1] [2:Full Thickness Without Exposed Support Structures] [3:Grade 1] HBO Classification: [10:N/A] [2:Grade 0] [3:N/A] Exudate Amount: [10:Small] [2:None Present] [3:None Present] Exudate Type: [10:Serosanguineous] [2:N/A] [3:N/A] Exudate Color: [10:red, brown] [2:N/A] [3:N/A] Wound Margin: [10:Distinct, outline attached Distinct, outline attached Distinct, outline attached] Granulation Amount: [10:Large (67-100%)] [2:None Present (0%)] [3:None Present (0%)] Granulation  Quality: [10:Pink, Pale] [2:N/A] [3:N/A] Necrotic Amount: [10:Small (1-33%)] [2:None Present (0%)] [3:None Present (0%)] Exposed Structures: [10:Fascia: No Fat: No] [2:Fascia: No Fat: No] [3:Fascia: No Fat: No] Tendon: No Tendon: No Tendon: No Muscle: No Muscle: No Muscle: No Joint: No Joint: No Joint: No Bone: No Bone: No Bone: No Limited to Skin Limited to Skin Limited to Skin Breakdown Breakdown Breakdown Epithelialization: None Large (67-100%) Large (67-100%) Periwound Skin Texture: Edema: Yes Edema: No Edema: No Excoriation: No Excoriation: No Excoriation: No Induration: No Induration: No Induration: No Callus: No Callus: No Callus: No Crepitus: No Crepitus: No Crepitus: No Fluctuance: No Fluctuance: No Fluctuance: No Friable: No Friable: No Friable: No Rash: No Rash: No Rash: No Scarring: No Scarring: No Scarring: No Periwound Skin Moist: Yes Dry/Scaly: Yes Dry/Scaly: Yes Moisture: Maceration: No Maceration: No Maceration: No Dry/Scaly: No Moist: No Moist: No Periwound Skin Color: Atrophie Blanche: No Atrophie Blanche: No Atrophie Blanche: No Cyanosis: No Cyanosis: No Cyanosis: No Ecchymosis: No Ecchymosis: No Ecchymosis: No Erythema: No Erythema: No Erythema: No Hemosiderin Staining: No Hemosiderin Staining: No Hemosiderin Staining: No Mottled: No Mottled: No Mottled: No Pallor: No Pallor: No Pallor: No Rubor: No Rubor: No Rubor: No Temperature: No Abnormality No Abnormality No Abnormality Tenderness on No No No Palpation: Wound Preparation: Ulcer Cleansing: Ulcer Cleansing: Ulcer Cleansing: Rinsed/Irrigated with Rinsed/Irrigated with Rinsed/Irrigated with Saline Saline Saline Topical Anesthetic Topical Anesthetic Topical Anesthetic Applied: None Applied: None Applied: None Wound Number: 7 9 N/A Photos: No Photos No Photos N/A Wound Location: Right Toe Second Right Toe Fourth N/A Wounding Event: Thermal Burn Thermal  Burn N/A Primary Etiology: 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 ([id number]) Measurements L x W x D (cm) Area (cm) : 0 0 N/A Volume (cm) : 0 0 N/A % Reduction in Area: 100.00% 100.00% N/A % Reduction in Volume: 100.00% 100.00% N/A Classification: Grade 1 Grade 1 N/A HBO Classification: N/A N/A N/A Exudate Amount: None Present None Present N/A Exudate Type: N/A N/A N/A Exudate Color: N/A N/A N/A Wound Margin: Distinct, outline attached Distinct, outline attached N/A Granulation Amount: None Present (0%) None Present (0%) N/A Granulation Quality: N/A N/A N/A Necrotic Amount: None Present (0%) None Present (0%) N/A Exposed Structures: Fascia: No  Fascia: No N/A Fat: No Fat: No Tendon: No Tendon: No Muscle: No Muscle: No Joint: No Joint: No Bone: No Bone: No Limited to Skin Limited to Skin Breakdown Breakdown Epithelialization: None None N/A Periwound Skin Texture: Edema: No Edema: No N/A Excoriation: No Excoriation: No Induration: No Induration: No Callus: No Callus: No Crepitus: No Crepitus: No Fluctuance: No Fluctuance: No Friable: No Friable: No Rash: No Rash: No Scarring: No Scarring: No Periwound Skin Dry/Scaly: Yes Dry/Scaly: Yes N/A Moisture: Maceration: No Maceration: No Moist: No Moist: No Periwound Skin Color: Atrophie Blanche: No Atrophie Blanche: No N/A Cyanosis: No Cyanosis: No Ecchymosis: No Ecchymosis: No Erythema: No Erythema: No Hemosiderin Staining: No Hemosiderin Staining: No Mottled: No Mottled: No Pallor: No Pallor: No Rubor: No Rubor: No Temperature: No Abnormality No  Abnormality N/A Tenderness on No No N/A Palpation: Wound Preparation: Ulcer Cleansing: Ulcer Cleansing: N/A Rinsed/Irrigated with Rinsed/Irrigated with Saline Saline Fitzner, Jayr P. ([id number]) Topical Anesthetic Topical Anesthetic Applied: None Applied: None Treatment Notes Wound #10 (Left, Lateral Lower Leg) 1. Cleansed with: Clean wound with Normal Saline 4. Dressing Applied: Silvadene Cream 5. Secondary Dressing Applied [place] 7. Secured with International aid/development worker) Signed: 07/25/2015 4:04:36 PM By: Elpidio Eric BSN, RN Entered By: Elpidio Eric on 07/25/2015 14:23:48 Boucher, [name] ([id number]) -------------------------------------------------------------------------------- Multi-Disciplinary Care Plan Details Patient Name: Epler, Dyllin P. Date of Service: 07/25/2015 1:45 PM Medical Record Number: [id number] Patient Account Number: [id number] Date of Birth/Sex: 1961/04/28 (55 y.o. Male) Treating RN: Clover Mealy, RN, BSN, [place] Sink Primary Care Physician: Hillery Aldo Other Clinician: Referring Physician: Hillery Aldo Treating Physician/Extender: Rudene Re in Treatment: 6 Active Inactive Electronic Signature(s) Signed: 08/18/2015 10:28:01 AM By: Elpidio Eric BSN, RN Previous Signature: 07/25/2015 4:04:36 PM Version By: Elpidio Eric BSN, RN Entered By: Elpidio Eric on 08/18/2015 10:28:00 Villa, [name] ([id number]) -------------------------------------------------------------------------------- Patient/Caregiver Education Details Patient Name: [name]. Date of Service: 07/25/2015 1:45 PM Medical Record Number: [id number] Patient Account Number: [id number] Date of Birth/Gender: [birth year] (55 y.o. Male) Treating RN: Clover Mealy, RN, BSN, [place] Sink Primary Care Physician: Hillery Aldo Other Clinician: Referring Physician: Hillery Aldo Treating Physician/Extender: Rudene Re in Treatment: 6 Education Assessment Education Provided To: Patient Education Topics  Provided [facility]: Methods: Explain/Verbal Responses: State content correctly Wound/Skin Impairment: Methods: Explain/Verbal Responses: State content correctly Electronic Signature(s) Signed: 07/25/2015 4:04:36 PM By: Elpidio Eric BSN, RN Entered By: Elpidio Eric on 07/25/2015 14:22:17 Burd, [name] ([id number]) -------------------------------------------------------------------------------- Wound Assessment Details Patient Name: Kreps, Kharson P. Date of Service: 07/25/2015 1:45 PM Medical Record Number: [id number] Patient Account Number: [id number] Date of Birth/Sex: 27-May-1961 (55 y.o. Male) Treating RN: Clover Mealy, RN, BSN, [place] Sink Primary Care Physician: Hillery Aldo Other Clinician: Referring Physician: Hillery Aldo Treating Physician/Extender: Rudene Re in Treatment: 6 Wound Status Wound Number: 10 Primary Diabetic Wound/Ulcer of the Lower Etiology: Extremity Wound Location: Left Lower Leg - Lateral Wound Healed - Epithelialized Wounding Event: Thermal Burn Status: Date Acquired: 05/29/2015 Comorbid Arrhythmia, Hypertension, Type II Weeks Of Treatment: 6 History: Diabetes, Neuropathy Clustered Wound: No Photos Wound Measurements Length: (cm) Width: (cm) Depth: (cm) Area: (cm) Volume: (cm) 0 % Reduction in Area: 100% 0 % Reduction in Volume: 100% 0 Epithelialization: None 0 Tunneling: No 0 Undermining: No Wound Description Classification: Grade 1 Wound Margin: Distinct, outline attached Exudate Amount: None Present Foul Odor After Cleansing: No Wound Bed Granulation Amount: Large (67-100%) Exposed Structure Finerty, Rodel P. ([id number]) Granulation Quality: Pink, Pale Fascia Exposed: No Necrotic Amount: Small (1-33%) Fat Layer  Exposed: No Necrotic Quality: Adherent Slough Tendon Exposed: No Muscle Exposed: No Joint Exposed: No Bone Exposed: No Limited to Skin Breakdown Periwound Skin Texture Texture Color No Abnormalities Noted:  No No Abnormalities Noted: No Callus: No Atrophie Blanche: No Crepitus: No Cyanosis: No Excoriation: No Ecchymosis: No Fluctuance: No Erythema: No Friable: No Hemosiderin Staining: No Induration: No Mottled: No Localized Edema: Yes Pallor: No Rash: No Rubor: No Scarring: No Temperature / Pain Moisture Temperature: No Abnormality No Abnormalities Noted: No Dry / Scaly: No Maceration: No Moist: Yes Wound Preparation Ulcer Cleansing: Rinsed/Irrigated with Saline Topical Anesthetic Applied: None Assessment Notes Patient called on 08/08/15 stating his wounds are all healed , cleared and dried up. Treatment Notes Wound #10 (Left, Lateral Lower Leg) 1. Cleansed with: Clean wound with Normal Saline 4. Dressing Applied: Silvadene Cream 5. Secondary Dressing Applied [place] 7. Secured with International aid/development worker) Signed: 08/18/2015 10:31:47 AM By: Elpidio Eric BSN, RN Previous Signature: 07/25/2015 2:06:25 PM Version By: Elpidio Eric BSN, RN Eustache, [name] ([id number]) Entered By: Elpidio Eric on 08/18/2015 10:31:46 Calmes, [name] ([id number]) -------------------------------------------------------------------------------- Wound Assessment Details Patient Name: Rautio, Neziah P. Date of Service: 07/25/2015 1:45 PM Medical Record Number: [id number] Patient Account Number: [id number] Date of Birth/Sex: May 15, 1961 (55 y.o. Male) Treating RN: Clover Mealy, RN, BSN, [place] Sink Primary Care Physician: Hillery Aldo Other Clinician: Referring Physician: Hillery Aldo Treating Physician/Extender: BURNS III, Regis Bill in Treatment: 6 Wound Status Wound Number: 2 Primary 2nd degree Burn Etiology: Wound Location: Left Lower Leg - Distal Wound Healed - Epithelialized Wounding Event: Thermal Burn Status: Date Acquired: 05/29/2015 Comorbid Arrhythmia, Hypertension, Type II Weeks Of Treatment: 6 History: Diabetes, Neuropathy Clustered Wound: No Photos Photo Uploaded By: Elpidio Eric on  07/25/2015 15:19:52 Wound Measurements Length: (cm) 0 % Reduction Width: (cm) 0 % Reduction Depth: (cm) 0 Epitheliali Area: (cm) 0 Volume: (cm) 0 in Area: 100% in Volume: 100% zation: Large (67-100%) Wound Description Full Thickness Without Classification: Exposed Support Structures Diabetic Severity Grade 0 (Wagner): Wound Margin: Distinct, outline attached Laflam, Lonzy P. ([id number]) Foul Odor After Cleansing: No Exudate Amount: None Present Wound Bed Granulation Amount: None Present (0%) Exposed Structure Necrotic Amount: None Present (0%) Fascia Exposed: No Fat Layer Exposed: No Tendon Exposed: No Muscle Exposed: No Joint Exposed: No Bone Exposed: No Limited to Skin Breakdown Periwound Skin Texture Texture Color No Abnormalities Noted: No No Abnormalities Noted: No Callus: No Atrophie Blanche: No Crepitus: No Cyanosis: No Excoriation: No Ecchymosis: No Fluctuance: No Erythema: No Friable: No Hemosiderin Staining: No Induration: No Mottled: No Localized Edema: No Pallor: No Rash: No Rubor: No Scarring: No Temperature / Pain Moisture Temperature: No Abnormality No Abnormalities Noted: No Dry / Scaly: Yes Maceration: No Moist: No Wound Preparation Ulcer Cleansing: Rinsed/Irrigated with Saline Topical Anesthetic Applied: None Electronic Signature(s) Signed: 07/25/2015 2:07:07 PM By: Elpidio Eric BSN, RN Entered By: Elpidio Eric on 07/25/2015 14:07:07 Douglas, [name] ([id number]) -------------------------------------------------------------------------------- Wound Assessment Details Patient Name: Police, Shahzaib P. Date of Service: 07/25/2015 1:45 PM Medical Record Number: [id number] Patient Account Number: [id number] Date of Birth/Sex: 10/16/1960 (55 y.o. Male) Treating RN: Clover Mealy, RN, BSN, [place] Sink Primary Care Physician: Hillery Aldo Other Clinician: Referring Physician: Hillery Aldo Treating Physician/Extender: BURNS III, Regis Bill in Treatment:  6 Wound Status Wound Number: 3 Primary Diabetic Wound/Ulcer of the Lower Etiology: Extremity Wound Location: Left Toe Great Wound Healed - Epithelialized Wounding Event: Thermal Burn Status: Date Acquired: 05/29/2015 Comorbid Arrhythmia, Hypertension, Type II Weeks Of Treatment: 6 History: Diabetes, Neuropathy Clustered  Wound: No Photos Photo Uploaded By: Elpidio Eric on 07/25/2015 15:19:54 Wound Measurements Length: (cm) Width: (cm) Depth: (cm) Area: (cm) Volume: (cm) 0 % Reduction in Area: 100% 0 % Reduction in Volume: 100% 0 Epithelialization: Large (67-100%) 0 Tunneling: No 0 Undermining: No Wound Description Classification: Grade 1 Wound Margin: Distinct, outline attached Exudate Amount: None Present Foul Odor After Cleansing: No Wound Bed Granulation Amount: None Present (0%) Exposed Structure Necrotic Amount: None Present (0%) Fascia Exposed: No Fat Layer Exposed: No Tendon Exposed: No Muscle Exposed: No Joint Exposed: No Vicknair, Jami P. ([id number]) Bone Exposed: No Limited to Skin Breakdown Periwound Skin Texture Texture Color No Abnormalities Noted: No No Abnormalities Noted: No Callus: No Atrophie Blanche: No Crepitus: No Cyanosis: No Excoriation: No Ecchymosis: No Fluctuance: No Erythema: No Friable: No Hemosiderin Staining: No Induration: No Mottled: No Localized Edema: No Pallor: No Rash: No Rubor: No Scarring: No Temperature / Pain Moisture Temperature: No Abnormality No Abnormalities Noted: No Dry / Scaly: Yes Maceration: No Moist: No Wound Preparation Ulcer Cleansing: Rinsed/Irrigated with Saline Topical Anesthetic Applied: None Electronic Signature(s) Signed: 07/25/2015 2:07:40 PM By: Elpidio Eric BSN, RN Entered By: Elpidio Eric on 07/25/2015 14:07:40 Ripp, [name] ([id number]) -------------------------------------------------------------------------------- Wound Assessment Details Patient Name: Eastham, Nathanyl P. Date of  Service: 07/25/2015 1:45 PM Medical Record Number: [id number] Patient Account Number: [id number] Date of Birth/Sex: [birth year] (55 y.o. Male) Treating RN: Clover Mealy, RN, BSN, [place] Sink Primary Care Physician: Hillery Aldo Other Clinician: Referring Physician: Hillery Aldo Treating Physician/Extender: BURNS III, WALTER Weeks in Treatment: 6 Wound Status Wound Number: 7 Primary Diabetic Wound/Ulcer of the Lower Etiology: Extremity Wound Location: Right Toe Second Wound Healed - Epithelialized Wounding Event: Thermal Burn Status: Date Acquired: 05/29/2015 Comorbid Arrhythmia, Hypertension, Type II Weeks Of Treatment: 6 History: Diabetes, Neuropathy Clustered Wound: No Photos Photo Uploaded By: Elpidio Eric on 07/25/2015 15:20:28 Wound Measurements Length: (cm) 0 % Reducti Width: (cm) 0 % Reducti Depth: (cm) 0 Epithelia Area: (cm) 0 Tunnelin Volume: (cm) 0 Undermin on in Area: 100% on in Volume: 100% lization: None g: No ing: No Wound Description Classification: Grade 1 Wound Margin: Distinct, outline attached Exudate Amount: None Present Foul Odor After Cleansing: No Wound Bed Granulation Amount: None Present (0%) Exposed Structure Necrotic Amount: None Present (0%) Fascia Exposed: No Fat Layer Exposed: No Tendon Exposed: No Muscle Exposed: No Joint Exposed: No Sawyers, Kalep P. ([id number]) Bone Exposed: No Limited to Skin Breakdown Periwound Skin Texture Texture Color No Abnormalities Noted: No No Abnormalities Noted: No Callus: No Atrophie Blanche: No Crepitus: No Cyanosis: No Excoriation: No Ecchymosis: No Fluctuance: No Erythema: No Friable: No Hemosiderin Staining: No Induration: No Mottled: No Localized Edema: No Pallor: No Rash: No Rubor: No Scarring: No Temperature / Pain Moisture Temperature: No Abnormality No Abnormalities Noted: No Dry / Scaly: Yes Maceration: No Moist: No Wound Preparation Ulcer Cleansing: Rinsed/Irrigated with Saline Topical  Anesthetic Applied: None Electronic Signature(s) Signed: 07/25/2015 2:08:07 PM By: Elpidio Eric BSN, RN Entered By: Elpidio Eric on 07/25/2015 14:08:07 Malanowski, [name] ([id number]) -------------------------------------------------------------------------------- Wound Assessment Details Patient Name: Chittenden, Alante P. Date of Service: 07/25/2015 1:45 PM Medical Record Number: [id number] Patient Account Number: [id number] Date of Birth/Sex: 1961/05/17 (55 y.o. Male) Treating RN: Clover Mealy, RN, BSN, [place] Sink Primary Care Physician: Hillery Aldo Other Clinician: Referring Physician: Hillery Aldo Treating Physician/Extender: BURNS III, WALTER Weeks in Treatment: 6 Wound Status Wound Number: [id number] Primary Diabetic Wound/Ulcer of the Lower Etiology: Extremity Wound Location: Right Toe Fourth Wound Healed - Epithelialized Wounding Event: Thermal  Burn Status: Date Acquired: 05/28/2015 Comorbid Arrhythmia, Hypertension, Type II Weeks Of Treatment: 6 History: Diabetes, Neuropathy Clustered Wound: No Photos Photo Uploaded By: Elpidio Eric on 07/25/2015 15:20:29 Wound Measurements Length: (cm) 0 % Reduction Width: (cm) 0 % Reduction Depth: (cm) 0 Epitheliali Area: (cm) 0 Tunneling: Volume: (cm) 0 Underminin in Area: 100% in Volume: 100% zation: None No g: No Wound Description Classification: Grade 1 Wound Margin: Distinct, outline attached Exudate Amount: None Present Foul Odor After Cleansing: No Wound Bed Termini, Roark P. ([id number]) Granulation Amount: None Present (0%) Exposed Structure Necrotic Amount: None Present (0%) Fascia Exposed: No Fat Layer Exposed: No Tendon Exposed: No Muscle Exposed: No Joint Exposed: No Bone Exposed: No Limited to Skin Breakdown Periwound Skin Texture Texture Color No Abnormalities Noted: No No Abnormalities Noted: No Callus: No Atrophie Blanche: No Crepitus: No Cyanosis: No Excoriation: No Ecchymosis: No Fluctuance: No Erythema: No Friable:  No Hemosiderin Staining: No Induration: No Mottled: No Localized Edema: No Pallor: No Rash: No Rubor: No Scarring: No Temperature / Pain Moisture Temperature: No Abnormality No Abnormalities Noted: No Dry / Scaly: Yes Maceration: No Moist: No Wound Preparation Ulcer Cleansing: Rinsed/Irrigated with Saline Topical Anesthetic Applied: None Electronic Signature(s) Signed: 07/25/2015 2:08:56 PM By: Elpidio Eric BSN, RN Previous Signature: 07/25/2015 2:08:31 PM Version By: Elpidio Eric BSN, RN Entered By: Elpidio Eric on 07/25/2015 14:08:55 Fitz, [name] ([id number]) -------------------------------------------------------------------------------- Vitals Details Patient Name: Barrette, Merek P. Date of Service: 07/25/2015 1:45 PM Medical Record Number: [id number] Patient Account Number: [id number] Date of Birth/Sex: [birth year] (55 y.o. Male) Treating RN: Clover Mealy, RN, BSN, [name] Primary Care Physician: Hillery Aldo Other Clinician: Referring Physician: Hillery Aldo Treating Physician/Extender: BURNS III, Regis Bill in Treatment: 6 Vital Signs Time Taken: 13:59 Temperature (F): 98 Height (in): 75 Pulse (bpm): 98 Weight (lbs): 321 Respiratory Rate (breaths/min): 21 Body Mass Index (BMI): 40.1 Blood Pressure (mmHg): 118/80 Reference Range: 80 - 120 mg / dl Electronic Signature(s) Signed: 07/25/2015 4:04:36 PM By: Elpidio Eric BSN, RN Entered By: Elpidio Eric on 07/25/2015 14:00:07

## 2015-08-09 ENCOUNTER — Ambulatory Visit: Payer: Medicare HMO | Admitting: Internal Medicine

## 2016-11-02 ENCOUNTER — Inpatient Hospital Stay
Admission: EM | Admit: 2016-11-02 | Discharge: 2016-11-04 | DRG: 292 | Disposition: A | Discharge status: Home / Self Care | Payer: Medicare HMO | Attending: Internal Medicine | Admitting: Internal Medicine

## 2016-11-02 ENCOUNTER — Emergency Department: Payer: Medicare HMO

## 2016-11-02 DIAGNOSIS — K219 Gastro-esophageal reflux disease without esophagitis: Secondary | ICD-10-CM | POA: Diagnosis present

## 2016-11-02 DIAGNOSIS — Z7902 Long term (current) use of antithrombotics/antiplatelets: Secondary | ICD-10-CM

## 2016-11-02 DIAGNOSIS — R0600 Dyspnea, unspecified: Secondary | ICD-10-CM

## 2016-11-02 DIAGNOSIS — Z794 Long term (current) use of insulin: Secondary | ICD-10-CM

## 2016-11-02 DIAGNOSIS — I5033 Acute on chronic diastolic (congestive) heart failure: Secondary | ICD-10-CM | POA: Diagnosis present

## 2016-11-02 DIAGNOSIS — Z86711 Personal history of pulmonary embolism: Secondary | ICD-10-CM | POA: Diagnosis not present

## 2016-11-02 DIAGNOSIS — E662 Morbid (severe) obesity with alveolar hypoventilation: Secondary | ICD-10-CM | POA: Diagnosis present

## 2016-11-02 DIAGNOSIS — R079 Chest pain, unspecified: Secondary | ICD-10-CM

## 2016-11-02 DIAGNOSIS — Z79899 Other long term (current) drug therapy: Secondary | ICD-10-CM

## 2016-11-02 DIAGNOSIS — I252 Old myocardial infarction: Secondary | ICD-10-CM | POA: Diagnosis not present

## 2016-11-02 DIAGNOSIS — Z930 Tracheostomy status: Secondary | ICD-10-CM | POA: Diagnosis not present

## 2016-11-02 DIAGNOSIS — E871 Hypo-osmolality and hyponatremia: Secondary | ICD-10-CM | POA: Diagnosis present

## 2016-11-02 DIAGNOSIS — I110 Hypertensive heart disease with heart failure: Principal | ICD-10-CM | POA: Diagnosis present

## 2016-11-02 DIAGNOSIS — Z955 Presence of coronary angioplasty implant and graft: Secondary | ICD-10-CM | POA: Diagnosis not present

## 2016-11-02 DIAGNOSIS — Z8249 Family history of ischemic heart disease and other diseases of the circulatory system: Secondary | ICD-10-CM | POA: Diagnosis not present

## 2016-11-02 DIAGNOSIS — I482 Chronic atrial fibrillation: Secondary | ICD-10-CM | POA: Diagnosis not present

## 2016-11-02 DIAGNOSIS — Z6841 Body Mass Index (BMI) 40.0 and over, adult: Secondary | ICD-10-CM

## 2016-11-02 DIAGNOSIS — E1142 Type 2 diabetes mellitus with diabetic polyneuropathy: Secondary | ICD-10-CM | POA: Diagnosis present

## 2016-11-02 DIAGNOSIS — J9611 Chronic respiratory failure with hypoxia: Secondary | ICD-10-CM | POA: Diagnosis present

## 2016-11-02 DIAGNOSIS — I4891 Unspecified atrial fibrillation: Secondary | ICD-10-CM

## 2016-11-02 DIAGNOSIS — K760 Fatty (change of) liver, not elsewhere classified: Secondary | ICD-10-CM | POA: Diagnosis present

## 2016-11-02 HISTORY — DX: Heart failure, unspecified: I50.9

## 2016-11-02 HISTORY — DX: Acute myocardial infarction, unspecified: I21.9

## 2016-11-02 LAB — CBC WITH DIFFERENTIAL/PLATELET
BASOS PCT: 1 %
Basophils Absolute: 0.1 10*3/uL (ref 0–0.1)
EOS ABS: 0.1 10*3/uL (ref 0–0.7)
EOS PCT: 2 %
HCT: 37.7 % — ABNORMAL LOW (ref 40.0–52.0)
HEMOGLOBIN: 13.3 g/dL (ref 13.0–18.0)
Lymphocytes Relative: 22 %
Lymphs Abs: 1.1 10*3/uL (ref 1.0–3.6)
MCH: 31.4 pg (ref 26.0–34.0)
MCHC: 35.4 g/dL (ref 32.0–36.0)
MCV: 88.8 fL (ref 80.0–100.0)
MONOS PCT: 8 %
Monocytes Absolute: 0.4 10*3/uL (ref 0.2–1.0)
NEUTROS PCT: 67 %
Neutro Abs: 3.3 10*3/uL (ref 1.4–6.5)
PLATELETS: 168 10*3/uL (ref 150–440)
RBC: 4.24 MIL/uL — ABNORMAL LOW (ref 4.40–5.90)
RDW: 12.6 % (ref 11.5–14.5)
WBC: 4.9 10*3/uL (ref 3.8–10.6)

## 2016-11-02 LAB — GLUCOSE, CAPILLARY
Glucose-Capillary: 93 mg/dL (ref 65–99)
Glucose-Capillary: 222 mg/dL — ABNORMAL HIGH (ref 65–99)

## 2016-11-02 LAB — COMPREHENSIVE METABOLIC PANEL
ALBUMIN: 4 g/dL (ref 3.5–5.0)
ALK PHOS: 96 U/L (ref 38–126)
ALT: 23 U/L (ref 17–63)
ANION GAP: 11 (ref 5–15)
AST: 32 U/L (ref 15–41)
BUN: 20 mg/dL (ref 6–20)
CALCIUM: 8.4 mg/dL — AB (ref 8.9–10.3)
CHLORIDE: 87 mmol/L — AB (ref 101–111)
CO2: 20 mmol/L — AB (ref 22–32)
Creatinine, Ser: 0.75 mg/dL (ref 0.61–1.24)
GFR calc Af Amer: >60 mL/min (ref >60)
GFR calc non Af Amer: >60 mL/min (ref >60)
GLUCOSE: 167 mg/dL — AB (ref 65–99)
POTASSIUM: 4 mmol/L (ref 3.5–5.1)
SODIUM: 118 mmol/L — AB (ref 135–145)
Total Bilirubin: 1 mg/dL (ref 0.3–1.2)
Total Protein: 7.4 g/dL (ref 6.5–8.1)

## 2016-11-02 LAB — TROPONIN I
Troponin I: <0.03 ng/mL (ref <0.03)
Troponin I: <0.03 ng/mL (ref <0.03)

## 2016-11-02 LAB — PROTIME-INR
INR: 1.02
Prothrombin Time: 13.4 seconds (ref 11.4–15.2)

## 2016-11-02 MED ORDER — IPRATROPIUM-ALBUTEROL 0.5-2.5 (3) MG/3ML IN SOLN
3.0000 mL | Freq: Four times a day (QID) | RESPIRATORY_TRACT | Status: DC
  Administered 2016-11-02 – 2016-11-04 (×6): 3 mL via RESPIRATORY_TRACT
  Filled 2016-11-02 (×6): qty 3

## 2016-11-02 MED ORDER — ONDANSETRON HCL 4 MG/2ML IJ SOLN
INTRAMUSCULAR | Status: AC
  Administered 2016-11-02: 4 mg via INTRAVENOUS
  Filled 2016-11-02: qty 2

## 2016-11-02 MED ORDER — MORPHINE SULFATE (PF) 2 MG/ML IV SOLN
2.0000 mg | Freq: Once | INTRAVENOUS | Status: AC
  Administered 2016-11-02: 2 mg via INTRAVENOUS

## 2016-11-02 MED ORDER — METOPROLOL TARTRATE 50 MG PO TABS
100.0000 mg | ORAL_TABLET | Freq: Two times a day (BID) | ORAL | Status: DC
  Administered 2016-11-02 – 2016-11-04 (×4): 100 mg via ORAL
  Filled 2016-11-02 (×2): qty 2
  Filled 2016-11-02: qty 1
  Filled 2016-11-02: qty 2

## 2016-11-02 MED ORDER — MORPHINE SULFATE (PF) 2 MG/ML IV SOLN
INTRAVENOUS | Status: AC
Start: 2016-11-02 — End: 2016-11-02
  Administered 2016-11-02: 2 mg via INTRAVENOUS
  Filled 2016-11-02: qty 1

## 2016-11-02 MED ORDER — GABAPENTIN 400 MG PO CAPS
800.0000 mg | ORAL_CAPSULE | Freq: Two times a day (BID) | ORAL | Status: DC
  Administered 2016-11-02 – 2016-11-04 (×4): 800 mg via ORAL
  Filled 2016-11-02 (×4): qty 2

## 2016-11-02 MED ORDER — IOPAMIDOL (ISOVUE-370) INJECTION 76%
75.0000 mL | Freq: Once | INTRAVENOUS | Status: AC | PRN
  Administered 2016-11-02: 75 mL via INTRAVENOUS

## 2016-11-02 MED ORDER — MORPHINE SULFATE (PF) 2 MG/ML IV SOLN
2.0000 mg | INTRAVENOUS | Status: DC | PRN
  Administered 2016-11-02 – 2016-11-03 (×3): 2 mg via INTRAVENOUS
  Filled 2016-11-02 (×3): qty 1

## 2016-11-02 MED ORDER — CLOPIDOGREL BISULFATE 75 MG PO TABS
75.0000 mg | ORAL_TABLET | Freq: Every day | ORAL | Status: DC
  Administered 2016-11-03 – 2016-11-04 (×2): 75 mg via ORAL
  Filled 2016-11-02 (×2): qty 1

## 2016-11-02 MED ORDER — SODIUM CHLORIDE 0.9 % IV BOLUS (SEPSIS)
1000.0000 mL | Freq: Once | INTRAVENOUS | Status: AC
  Administered 2016-11-02: 1000 mL via INTRAVENOUS

## 2016-11-02 MED ORDER — INSULIN DETEMIR 100 UNIT/ML ~~LOC~~ SOLN
80.0000 [IU] | Freq: Two times a day (BID) | SUBCUTANEOUS | Status: DC
  Filled 2016-11-02: qty 0.8

## 2016-11-02 MED ORDER — ENOXAPARIN SODIUM 40 MG/0.4ML ~~LOC~~ SOLN
40.0000 mg | Freq: Two times a day (BID) | SUBCUTANEOUS | Status: DC
  Administered 2016-11-02 – 2016-11-04 (×4): 40 mg via SUBCUTANEOUS
  Filled 2016-11-02 (×4): qty 0.4

## 2016-11-02 MED ORDER — INSULIN DETEMIR 100 UNIT/ML ~~LOC~~ SOLN
50.0000 [IU] | Freq: Two times a day (BID) | SUBCUTANEOUS | Status: DC
  Administered 2016-11-03 – 2016-11-04 (×3): 50 [IU] via SUBCUTANEOUS
  Filled 2016-11-02 (×5): qty 0.5

## 2016-11-02 MED ORDER — PANTOPRAZOLE SODIUM 40 MG PO TBEC
40.0000 mg | DELAYED_RELEASE_TABLET | Freq: Every day | ORAL | Status: DC
  Administered 2016-11-03 – 2016-11-04 (×2): 40 mg via ORAL
  Filled 2016-11-02 (×2): qty 1

## 2016-11-02 MED ORDER — ATORVASTATIN CALCIUM 20 MG PO TABS
40.0000 mg | ORAL_TABLET | Freq: Every day | ORAL | Status: DC
  Administered 2016-11-03: 40 mg via ORAL
  Filled 2016-11-02: qty 2

## 2016-11-02 MED ORDER — ACETAMINOPHEN 325 MG PO TABS
650.0000 mg | ORAL_TABLET | Freq: Four times a day (QID) | ORAL | Status: DC | PRN
  Administered 2016-11-03 (×2): 650 mg via ORAL
  Filled 2016-11-02 (×2): qty 2

## 2016-11-02 MED ORDER — CARBAMAZEPINE 200 MG PO TABS
400.0000 mg | ORAL_TABLET | Freq: Every day | ORAL | Status: DC
  Administered 2016-11-02 – 2016-11-03 (×2): 400 mg via ORAL
  Filled 2016-11-02 (×2): qty 2

## 2016-11-02 MED ORDER — NITROGLYCERIN 0.4 MG SL SUBL
0.4000 mg | SUBLINGUAL_TABLET | SUBLINGUAL | Status: DC | PRN
Start: 2016-11-02 — End: 2016-11-04

## 2016-11-02 MED ORDER — DOCUSATE SODIUM 100 MG PO CAPS
100.0000 mg | ORAL_CAPSULE | Freq: Two times a day (BID) | ORAL | Status: DC
  Administered 2016-11-02 – 2016-11-04 (×3): 100 mg via ORAL
  Filled 2016-11-02 (×4): qty 1

## 2016-11-02 MED ORDER — CARBAMAZEPINE 200 MG PO TABS
200.0000 mg | ORAL_TABLET | Freq: Every day | ORAL | Status: DC
  Administered 2016-11-03 – 2016-11-04 (×2): 200 mg via ORAL
  Filled 2016-11-02 (×2): qty 1

## 2016-11-02 MED ORDER — ONDANSETRON HCL 4 MG/2ML IJ SOLN
4.0000 mg | Freq: Once | INTRAMUSCULAR | Status: AC
  Administered 2016-11-02: 4 mg via INTRAVENOUS

## 2016-11-02 MED ORDER — ONDANSETRON HCL 4 MG/2ML IJ SOLN
4.0000 mg | Freq: Four times a day (QID) | INTRAMUSCULAR | Status: DC | PRN
  Administered 2016-11-03: 4 mg via INTRAVENOUS
  Filled 2016-11-02: qty 2

## 2016-11-02 MED ORDER — SODIUM CHLORIDE 0.9 % IV SOLN
INTRAVENOUS | Status: DC
  Administered 2016-11-02 – 2016-11-03 (×2): via INTRAVENOUS

## 2016-11-02 MED ORDER — VENLAFAXINE HCL ER 75 MG PO CP24
150.0000 mg | ORAL_CAPSULE | Freq: Every day | ORAL | Status: DC
  Administered 2016-11-03 – 2016-11-04 (×2): 150 mg via ORAL
  Filled 2016-11-02 (×2): qty 2

## 2016-11-02 MED ORDER — ENALAPRIL MALEATE 10 MG PO TABS
10.0000 mg | ORAL_TABLET | Freq: Two times a day (BID) | ORAL | Status: DC
  Administered 2016-11-02 – 2016-11-04 (×4): 10 mg via ORAL
  Filled 2016-11-02 (×4): qty 1

## 2016-11-02 MED ORDER — TORSEMIDE 20 MG PO TABS
100.0000 mg | ORAL_TABLET | Freq: Two times a day (BID) | ORAL | Status: DC
  Administered 2016-11-03 – 2016-11-04 (×3): 100 mg via ORAL
  Filled 2016-11-02 (×3): qty 5

## 2016-11-02 MED ORDER — ONDANSETRON HCL 4 MG PO TABS: 4.0000 mg | ORAL_TABLET | Freq: Four times a day (QID) | ORAL | Status: DC | PRN

## 2016-11-02 MED ORDER — CARBAMAZEPINE 200 MG PO TABS: 400.0000 mg | ORAL_TABLET | Freq: Every day | ORAL | Status: DC

## 2016-11-02 MED ORDER — INSULIN ASPART 100 UNIT/ML ~~LOC~~ SOLN
0.0000 [IU] | Freq: Three times a day (TID) | SUBCUTANEOUS | Status: DC
  Administered 2016-11-03 (×2): 2 [IU] via SUBCUTANEOUS
  Administered 2016-11-03: 1 [IU] via SUBCUTANEOUS
  Administered 2016-11-04: 2 [IU] via SUBCUTANEOUS
  Filled 2016-11-02 (×2): qty 2
  Filled 2016-11-02: qty 1
  Filled 2016-11-02: qty 2

## 2016-11-02 MED ORDER — BISACODYL 10 MG RE SUPP: 10.0000 mg | Freq: Every day | RECTAL | Status: DC | PRN

## 2016-11-02 MED ORDER — ACETAMINOPHEN 650 MG RE SUPP: 650.0000 mg | Freq: Four times a day (QID) | RECTAL | Status: DC | PRN

## 2016-11-02 NOTE — Progress Notes (Signed)
Pt. Arrived via stretcher and walked to bed with standby assist. Pt. Alert and oriented x4. Skin clean, warm and dry.  Abrasion to right ear with dried blood, pt. Refuses to have it washed off. Scabbed abrasion to left inner ankle. 3+ edema to BLE. Trach with no inner cannula, number 8 shiley. Respiratory therapist notified.  Skin assessed with Crystal, RN. Tele applied and called in to CCMD with 2nd verifier Danielle, RN. General room orientation given. Instruction on how to use call bell and ascom system given. Pt. On room air and covers trach with fingers to talk. Pt. Given cranberry juice and jello to eat. Will continue to monitor pt.

## 2016-11-02 NOTE — Progress Notes (Addendum)
[facility]. Pt. Informed of report given and that he would be transferring to room 19. CCMD notified of transfer.

## 2016-11-02 NOTE — ED Provider Notes (Signed)
[facility] Emergency Department Provider Note   Time Seen: 3:45PM  I have reviewed the triage vital signs and the nursing notes.   HISTORY  Chief Complaint Chest Pain and Shortness of Breath    HPI [name] is a 56 y.o. male below list of chronic medical conditions including CHF myocardial infarct, hypertension pulmonary emboli not currently on anti-cartilage and presents to the emergency department with onset of central chest tightness with a score of currently 8 out of 10. Patient denies any radiation of the pain. In addition patient admits to dyspnea has progressively worsened since onset.   Past Medical History:  Diagnosis Date  . CHF (congestive heart failure) (HCC)   . Diabetes mellitus (HCC)   . Diabetic peripheral neuropathy (HCC)   . Elevated liver enzymes    fatty liver per [facility]  . GERD (gastroesophageal reflux disease)   . History of pulmonary embolus (PE)   . Hyperlipidemia   . Hypertension   . Myocardial infarct (HCC)   . OSA (obstructive sleep apnea)   . [facility]    [facility] with ARDS  . Renal insufficiency     Patient Active Problem List   Diagnosis Date Noted  . Atrial fibrillation (HCC) 11/02/2016  . Chest pain 11/02/2016  . Hyponatremia 11/02/2016  . Chronic respiratory failure, unspecified whether with hypoxia or hypercapnia (HCC) 05/16/2014  . Ventilator dependence (HCC) 05/16/2014  . Obesity hypoventilation syndrome (HCC) 05/16/2014  . 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 CARDIAC SURGERY     With stent placement  . TRACHEOSTOMY  2007  . VASECTOMY      Prior to Admission medications   Medication Sig Start Date End Date Taking? Authorizing Provider  atorvastatin (LIPITOR) 40 MG tablet Take 40 mg by mouth daily at 6 PM. Once daily   Yes [provider]  carbamazepine (TEGRETOL) 200 MG tablet Take by  mouth. 1 in the morning 2 at night   Yes [provider]  clopidogrel (PLAVIX) 75 MG tablet Take 75 mg by mouth daily. Once daily   Yes [provider]  enalapril (VASOTEC) 10 MG tablet Take by mouth. 1 tablet twice daily   Yes [provider]  gabapentin (NEURONTIN) 800 MG tablet Take 800 mg by mouth 2 (two) times daily. 1 tablet twice daily 01/24/14  Yes [provider]  insulin detemir (LEVEMIR) 100 unit/ml SOLN Inject 50 Units into the skin.    Yes [provider]  insulin lispro (HUMALOG) 100 UNIT/ML injection Inject 15 Units into the skin 3 (three) times daily with meals. 40 units 3 times daily   Yes [provider]  metoprolol (LOPRESSOR) 100 MG tablet Take by mouth. 100 mg twice daily   Yes [provider]  omeprazole (PRILOSEC) 20 MG capsule Take 20 mg by mouth daily. Once daily   Yes [provider]  metFORMIN (GLUCOPHAGE-XR) 500 MG 24 hr tablet Take 1,000 mg by mouth 2 (two) times daily. 500 mg twice daily 01/24/14   [provider]    Allergies Cephalexin; Hctz  [hydrochlorothiazide]; Hydralazine; Penicillins; Reserpine; and Zaroxolyn [metolazone]  Family History  Problem Relation Age of Onset  . Heart attack Mother   . Coronary artery disease Father   . Heart attack Father     Social History Social History  Substance Use Topics  . Smoking status: Never Smoker  . Smokeless tobacco: Never  Used  . Alcohol use 3.6 - 4.2 oz/week    6 - 7 Cans of beer per week     Comment: Pt states 6-7 a night    Review of Systems Constitutional: No fever/chills Eyes: No visual changes. ENT: No sore throat. Cardiovascular: Positive for chest pain. Respiratory: Positive for shortness of breath. Gastrointestinal: No abdominal pain.  No nausea, no vomiting.  No diarrhea.  No constipation. Genitourinary: Negative for dysuria. Musculoskeletal: Negative for back pain. Integumentary: Negative for rash. Neurological:  Negative for headaches, focal weakness or numbness.   ____________________________________________   PHYSICAL EXAM:  VITAL SIGNS: ED Triage Vitals  Enc Vitals Group     BP 11/02/16 1547 132/77     Pulse Rate 11/02/16 1547 73     Resp 11/02/16 1547 13     Temp 11/02/16 1547 98.8 F (37.1 C)     Temp Source 11/02/16 1547 Oral     SpO2 11/02/16 1547 95 %     Weight 11/02/16 1548 (!) 305 lb (138.3 kg)     Height 11/02/16 1548 6\' 2"  (1.88 m)     Head Circumference --      Peak Flow --      Pain Score 11/02/16 1546 8     Pain Loc --      Pain Edu? --      Excl. in GC? --     Constitutional: Alert and oriented. Well appearing and in no acute distress. Eyes: Conjunctivae are normal. PERRL. EOMI. Head: Atraumatic. Mouth/Throat: Mucous membranes are moist.  Oropharynx non-erythematous. Neck: No stridor.  Cardiovascular: Irregular regular rhythm. Good peripheral circulation. Grossly normal heart sounds. Respiratory: Normal respiratory effort.  No retractions. Lungs CTAB. Gastrointestinal: Soft and nontender. No distention.  Musculoskeletal: No lower extremity tenderness nor edema. No gross deformities of extremities. Neurologic:  Normal speech and language. No gross focal neurologic deficits are appreciated.  Skin:  Skin is warm, dry and intact. No rash noted. Psychiatric: Mood and affect are normal. Speech and behavior are normal.  ____________________________________________   LABS (all labs ordered are listed, but only abnormal results are displayed)  Labs Reviewed  CBC WITH DIFFERENTIAL/PLATELET - Abnormal; Notable for the following:       Result Value   RBC 4.24 (*)    HCT 37.7 (*)    All other components within normal limits  COMPREHENSIVE METABOLIC PANEL - Abnormal; Notable for the following:    Sodium 118 (*)    Chloride 87 (*)    CO2 20 (*)    Glucose, Bld 167 (*)    Calcium 8.4 (*)    All other components within normal limits  TROPONIN I  PROTIME-INR  CBC    THYROID PANEL   ____________________________________________  EKG  ED ECG REPORT I, [place] N Joel Cowin, the attending physician, personally viewed and interpreted this ECG.   Date: 11/02/2016  EKG Time: 3:48 PM  Rate: 81  Rhythm: Atrial fibrillation  Axis: Normal  Intervals: Normal  ST&T Change: None  ____________________________________________  RADIOLOGY I, [place] N Graycen Degan, personally viewed and evaluated these images (plain radiographs) as part of my medical decision making, as well as reviewing the written report by the radiologist.  Dg Chest 2 View  Result Date: 11/02/2016 CLINICAL DATA:  Chest pain and shortness of breath today. EXAM: CHEST  2 VIEW COMPARISON:  07/29/2014 and prior radiographs FINDINGS: Cardiomegaly and mild peribronchial thickening/ interstitial prominence again noted. Mediastinal fullness is unchanged from 2011. A tracheostomy tube is again identified.  There is no evidence of focal airspace disease, pulmonary edema, suspicious pulmonary nodule/mass, pleural effusion, or pneumothorax. No acute bony abnormalities are identified. IMPRESSION: Cardiomegaly without evidence of acute cardiopulmonary disease. Electronically Signed   By: Harmon Pier M.D.   On: 11/02/2016 16:59   Ct Angio Chest Pe W Or Wo Contrast  Result Date: 11/02/2016 CLINICAL DATA:  Chest pain EXAM: CT ANGIOGRAPHY CHEST WITH CONTRAST TECHNIQUE: Multidetector CT imaging of the chest was performed using the standard protocol during bolus administration of intravenous contrast. Multiplanar CT image reconstructions and MIPs were obtained to evaluate the vascular anatomy. CONTRAST:  75 mL Isovue 370 COMPARISON:  Chest radiographs dated 11/02/2016 FINDINGS: Cardiovascular: Slight preferential opacification of the aortic arch. Satisfactory opacification of the bilateral pulmonary arteries to the segmental level. Evaluation is mildly constrained by respiratory motion. No evidence of pulmonary embolism. The  heart is normal in size.  No pericardial effusion. Three vessel coronary atherosclerosis. Mediastinum/Nodes: No suspicious mediastinal lymphadenopathy. Thyroid is gross unremarkable. Lungs/Pleura: Tracheostomy in satisfactory position. Evaluation of lung parenchyma is mildly constrained by respiratory motion. No suspicious pulmonary nodules. Mild mosaic attenuation. No focal consolidation. No pleural effusion or pneumothorax. Upper Abdomen: Visualized upper abdomen is notable for mild hepatic steatosis and layering tiny gallstones (series 4/ image 102). Musculoskeletal: Visualized osseous structures are within normal limits. Review of the MIP images confirms the above findings. IMPRESSION: No evidence of pulmonary embolism. No evidence of acute cardiopulmonary disease. Cholelithiasis.  Mild hepatic steatosis. Electronically Signed   By: Charline Bills M.D.   On: 11/02/2016 18:06     Procedures  Critical care:CRITICAL CARE Performed by: Darci Current   Total critical care time: 40 minutes  Critical care time was exclusive of separately billable procedures and treating other patients.  Critical care was necessary to treat or prevent imminent or life-threatening deterioration.  Critical care was time spent personally by me on the following activities: development of treatment plan with patient and/or surrogate as well as nursing, discussions with consultants, evaluation of patient's response to treatment, examination of patient, obtaining history from patient or surrogate, ordering and performing treatments and interventions, ordering and review of laboratory studies, ordering and review of radiographic studies, pulse oximetry and re-evaluation of patient's condition.  ____________________________________________   INITIAL IMPRESSION / ASSESSMENT AND PLAN / ED COURSE  Pertinent labs & imaging results that were available during my care of the patient were reviewed by me and considered in my  medical decision making (see chart for details).  Patient noted to be markedly hyponatremic with a sodium of 118 and a such normal saline was given IV. In addition patient noted to have new onset atrial fibrillation and a such will receive anticoagulation in the emergency department. Patient discussed with Dr. Renae Gloss hospitalist for admission for further evaluation and management.      ____________________________________________  FINAL CLINICAL IMPRESSION(S) / ED DIAGNOSES  Final diagnoses:  Atrial fibrillation (HCC)  Dyspnea  Chest pain  New onset atrial fibrillation (HCC)  Hyponatremia     MEDICATIONS GIVEN DURING THIS VISIT:  Medications  0.9 %  sodium chloride infusion (not administered)  insulin aspart (novoLOG) injection 0-9 Units (not administered)  ipratropium-albuterol (DUONEB) 0.5-2.5 (3) MG/3ML nebulizer solution 3 mL (not administered)  morphine 2 MG/ML injection 2 mg (not administered)  nitroGLYCERIN (NITROSTAT) SL tablet 0.4 mg (not administered)  morphine 2 MG/ML injection 2 mg (2 mg Intravenous Given 11/02/16 1611)  ondansetron (ZOFRAN) injection 4 mg (4 mg Intravenous Given 11/02/16 1611)  sodium  chloride 0.9 % bolus 1,000 mL (1,000 mLs Intravenous New Bag/Given 11/02/16 1657)  iopamidol (ISOVUE-370) 76 % injection 75 mL (75 mLs Intravenous Contrast Given 11/02/16 1747)     NEW OUTPATIENT MEDICATIONS STARTED DURING THIS VISIT:  New Prescriptions   No medications on file    Modified Medications   No medications on file    Discontinued Medications   TORSEMIDE (DEMADEX) 100 MG TABLET    1 tablet twice daily   VENLAFAXINE XR (EFFEXOR-XR) 150 MG 24 HR CAPSULE    Take by mouth. Once daily     Note:  This document was prepared using Dragon voice recognition software and may include unintentional dictation errors.    Darci Current, MD 11/02/16 [phone]

## 2016-11-02 NOTE — Progress Notes (Signed)
Patient seen earlier for svn tx. Patient has #8 shiley cuffless trach. He has stated he has wears ventilator at night however he has no idea of vent settings. Spoke with Dr. Hilton SinclairWeiting, he has transferred patient to unit.  [facility] physician has suggested simv/ps as a starting point for this patient.  Patient is on room air with saturation in 90's in no distress.

## 2016-11-02 NOTE — Progress Notes (Signed)
Pt. States he uses a ventilator at night. Respiratory therapist informed and will investigate.

## 2016-11-02 NOTE — Progress Notes (Signed)
Patient ID: [name], male   DOB: [birth year], 56 y.o.   MRN: [id number]  Respiratory therapist came up to me that the patient wears a ventilator at night. This will have to be done in the CCU stepdown. Respiratory therapist still trying to figure out settings. Case discussed with [facility] nurse. Physician will call me back shortly.  Dr. Carola Frostichard Whiting

## 2016-11-02 NOTE — ED Notes (Signed)
Pt again took himself off the monitor. Pt wanting to get hold of the ems to make sure they locked his house. Medic paulette helping him with that.

## 2016-11-02 NOTE — ED Triage Notes (Signed)
Per EMS, pt reports chest pain that began today and shortness of breath that began a couple of weeks ago per pt. Pt has hx of tracheostomy for 9 years and was using oxygen when EMS arrived (pt reports he uses a Shiley #8). Pt states the chest pain is mid to right side and is described as "heaviness." Pt also reports hx of MI and CHF, pt denies hx of irregular heartbeat. Pt reports being on Plavix as well. EMS reports giving the pt 324 of aspirin and 1 nitro spray. Pt A&O at this time.

## 2016-11-02 NOTE — ED Notes (Signed)
Pt back from ct and fluids restarted and vs taken

## 2016-11-02 NOTE — H&P (Signed)
History and Physical    [name] PoleJim Paul Hand ZOX:[id number] DOB: 1961-03-20 DOA: 11/02/2016  Referring physician: Dr. Manson PasseyBrown PCP: Hillery AldoPatel, Sarah, MD  Specialists: none  Chief Complaint: CP and dizziness  HPI: [name] is a 56 y.o. male has a past medical history significant for OSA, obesity, HTN, DM, and diastolic CHF now with recurrent dizziness and worsening SSCP/pressure. No fever. No N/V/D. Denies SOB. In ER, sodium=118/ Also found to be in  A-fib which is new. He is now admitted. Has remote hx of MI  Review of Systems: The patient denies anorexia, fever, weight loss,, vision loss, decreased hearing, hoarseness, syncope, dyspnea on exertion, peripheral edema, balance deficits, hemoptysis, abdominal pain, melena, hematochezia, severe indigestion/heartburn, hematuria, incontinence, genital sores, muscle weakness, suspicious skin lesions, transient blindness, difficulty walking, depression, unusual weight change, abnormal bleeding, enlarged lymph nodes, angioedema, and breast masses.   Past Medical History:  Diagnosis Date  . CHF (congestive heart failure) (HCC)   . Diabetes mellitus (HCC)   . Diabetic peripheral neuropathy (HCC)   . Elevated liver enzymes    fatty liver per [facility]  . GERD (gastroesophageal reflux disease)   . History of pulmonary embolus (PE)   . Hyperlipidemia   . Hypertension   . Myocardial infarct (HCC)   . OSA (obstructive sleep apnea)   . [facility]    [facility] induced with ARDS  . Renal insufficiency    Past Surgical History:  Procedure Laterality Date  . CARDIAC SURGERY     With stent placement  . TRACHEOSTOMY  2007  . VASECTOMY     Social History:  reports that he has never smoked. He has never used smokeless tobacco. He reports that he drinks about 3.6 - 4.2 oz of alcohol per week . He reports that he does not use drugs.  Allergies  Allergen Reactions  . Cephalexin     Other reaction(s): Unknown  . Hctz   [Hydrochlorothiazide]     Other reaction(s): Other (See Comments) hyponatremia  . Hydralazine   . Penicillins Hives  . Reserpine   . Zaroxolyn [Metolazone]     Other reaction(s): Unknown    Family History  Problem Relation Age of Onset  . Heart attack Mother   . Coronary artery disease Father   . Heart attack Father     Prior to Admission medications   Medication Sig Start Date End Date Taking? Authorizing Provider  atorvastatin (LIPITOR) 40 MG tablet Take by mouth. Once daily    [provider]  carbamazepine (TEGRETOL) 200 MG tablet Take by mouth. 400 mg in PM    [provider]  clopidogrel (PLAVIX) 75 MG tablet Take by mouth. Once daily    [provider]  enalapril (VASOTEC) 10 MG tablet Take by mouth. 1 tablet twice daily    [provider]  gabapentin (NEURONTIN) 800 MG tablet Take by mouth. 1 tablet twice daily 01/24/14   [provider]  insulin detemir (LEVEMIR) 100 unit/ml SOLN Inject into the skin. 80 units twice daily    [provider]  insulin lispro (HUMALOG) 100 UNIT/ML injection Inject into the skin. 40 units 3 times daily    [provider]  metFORMIN (GLUCOPHAGE-XR) 500 MG 24 hr tablet Take 1,000 mg by mouth 2 (two) times daily. 500 mg twice daily 01/24/14   [provider]  metoprolol (LOPRESSOR) 100 MG tablet Take by mouth. 100 mg twice daily    [provider]  omeprazole (  PRILOSEC) 20 MG capsule Take by mouth. Once daily    [provider]  torsemide (DEMADEX) 100 MG tablet 1 tablet twice daily 05/12/14   [provider]  venlafaxine XR (EFFEXOR-XR) 150 MG 24 hr capsule Take by mouth. Once daily    [provider]   Physical Exam: Vitals:   11/02/16 1547 11/02/16 1548  BP: 132/77   Pulse: 73   Resp: 13   Temp: 98.8 F (37.1 C)   TempSrc: Oral   SpO2: 95%   Weight:  (!) 138.3 kg (305 lb)  Height:  6\' 2"  (1.88 m)     General:  No apparent  distress, obese, [place]/AT  Eyes: PERRL, EOMI, no scleral icterus, conjunctiva clear  ENT: moist oropharynx without exudate, TM's benign, dentition good  Neck: supple, no lymphadenopathy. No bruits or thyromegaly  Cardiovascular: irregularly irregular without MRG; 2+ peripheral pulses, no JVD, 3+ peripheral edema  Respiratory: CTA biL, good air movement without wheezing, rhonchi or crackled. Respiratory effort normal  Abdomen: soft, non tender to palpation, positive bowel sounds, no guarding, no rebound  Skin: no lesions. Stasis dermatitis noted  Musculoskeletal: normal bulk and tone, no joint swelling  Psychiatric: normal mood and affect, A&OX3  Neurologic: CN 2-12 grossly intact, Motor strength 5/5 in all 4 groups with symmetric DTR's and non-focal sensory exam  Labs on Admission:  Basic Metabolic Panel:  Recent Labs Lab 11/02/16 1554  NA 118*  K 4.0  CL 87*  CO2 20*  GLUCOSE 167*  BUN 20  CREATININE 0.75  CALCIUM 8.4*   Liver Function Tests:  Recent Labs Lab 11/02/16 1554  AST 32  ALT 23  ALKPHOS 96  BILITOT 1.0  PROT 7.4  ALBUMIN 4.0   No results for input(s): LIPASE, AMYLASE in the last 168 hours. No results for input(s): AMMONIA in the last 168 hours. CBC:  Recent Labs Lab 11/02/16 1554  WBC 4.9  NEUTROABS 3.3  HGB 13.3  HCT 37.7*  MCV 88.8  PLT 168   Cardiac Enzymes:  Recent Labs Lab 11/02/16 1554  TROPONINI <0.03    BNP (last 3 results) No results for input(s): BNP in the last 8760 hours.  ProBNP (last 3 results) No results for input(s): PROBNP in the last 8760 hours.  CBG: No results for input(s): GLUCAP in the last 168 hours.  Radiological Exams on Admission: Dg Chest 2 View  Result Date: 11/02/2016 CLINICAL DATA:  Chest pain and shortness of breath today. EXAM: CHEST  2 VIEW COMPARISON:  07/29/2014 and prior radiographs FINDINGS: Cardiomegaly and mild peribronchial thickening/ interstitial prominence again noted. Mediastinal  fullness is unchanged from 2011. A tracheostomy tube is again identified. There is no evidence of focal airspace disease, pulmonary edema, suspicious pulmonary nodule/mass, pleural effusion, or pneumothorax. No acute bony abnormalities are identified. IMPRESSION: Cardiomegaly without evidence of acute cardiopulmonary disease. Electronically Signed   By: Harmon Pier M.D.   On: 11/02/2016 16:59    EKG: Independently reviewed.  Assessment/Plan Principal Problem:   Atrial fibrillation (HCC) Active Problems:   Obesity hypoventilation syndrome (HCC)   Chest pain   Hyponatremia   Will admit to telemetry and follow enzymes and sugars. Check echo and TSH. CT chest pending. Consult Cardiology. IV NS. Repeat labs in AM.  Diet: clear liquids Fluids: NS@100  DVT Prophylaxis: Lovenox  Code Status: FULL  Family Communication: yes  Disposition Plan: home  Time spent: 55 min

## 2016-11-02 NOTE — Progress Notes (Signed)
eLink Physician-Brief Progress Note Patient Name: [name] PoleJim Paul [name] DOB: [birth year] MRN: [id number]   Date of Service  11/02/2016  HPI/Events of Note  Patient has chronic trach, home settings not available, call received for consult and transfer to icu  eICU Interventions  Informed [place], nightitme PA . Advised to use SIMV so patient has a back up rate. TV 400, RR 8, PS 10, PEEP 5        Raine Blodgett 11/02/2016, 9:33 PM

## 2016-11-02 NOTE — Progress Notes (Signed)
Pt. Will be transferred to ICU for use of vent per MD order.

## 2016-11-02 NOTE — Consult Note (Addendum)
PULMONARY / CRITICAL CARE MEDICINE   Name: [name] MRN: [id number] DOB: 10-28-1960    ADMISSION DATE:  11/02/2016   CONSULTATION DATE:  11/02/16  REFERRING MD:  Dr. Renae GlossWieting  REASON: Nocturnal vent use  CHIEF COMPLAINT:  Chronic trach  HISTORY OF PRESENT ILLNESS:   This is a 56 y/o male with multiple comorbidities admitted with chest pain and dizziness and found to be in Afib and hyponatremic. CT angio negative for PE. He is transferred to the ICU for chronic nocturnal vent management. He has a cuffless trach. He is on RA during the day but requires PS at HS. He does not know what vent settings he uses at home. PCCM was consulted for chronic vent management. Patient si currently awake, able to cap trach and speak. States that he wants some morphine and soda. Offers no other complaints  PAST MEDICAL HISTORY :  He  has a past medical history of CHF (congestive heart failure) (HCC); Diabetes mellitus (HCC); Diabetic peripheral neuropathy (HCC); Elevated liver enzymes; GERD (gastroesophageal reflux disease); History of pulmonary embolus (PE); Hyperlipidemia; Hypertension; Myocardial infarct (HCC); OSA (obstructive sleep apnea); Pancreatitis; and Renal insufficiency.  PAST SURGICAL HISTORY: He  has a past surgical history that includes Vasectomy; Tracheostomy (2007); and Cardiac surgery.  Allergies  Allergen Reactions  . Cephalexin     Other reaction(s): Unknown  . Hctz  [Hydrochlorothiazide]     Other reaction(s): Other (See Comments) hyponatremia  . Hydralazine   . Penicillins Hives  . Reserpine   . Zaroxolyn [Metolazone]     Other reaction(s): Unknown    No current facility-administered medications on file prior to encounter.    Current Outpatient Prescriptions on File Prior to Encounter  Medication Sig  . atorvastatin (LIPITOR) 40 MG tablet Take 40 mg by mouth daily at 6 PM. Once daily  . carbamazepine (TEGRETOL) 200 MG tablet Take by mouth. 1 in the morning 2 at night   . clopidogrel (PLAVIX) 75 MG tablet Take 75 mg by mouth daily. Once daily  . enalapril (VASOTEC) 10 MG tablet Take by mouth. 1 tablet twice daily  . gabapentin (NEURONTIN) 800 MG tablet Take 800 mg by mouth 2 (two) times daily. 1 tablet twice daily  . insulin detemir (LEVEMIR) 100 unit/ml SOLN Inject 50 Units into the skin.   Marland Kitchen. insulin lispro (HUMALOG) 100 UNIT/ML injection Inject 15 Units into the skin 3 (three) times daily with meals. 40 units 3 times daily  . metoprolol (LOPRESSOR) 100 MG tablet Take by mouth. 100 mg twice daily  . omeprazole (PRILOSEC) 20 MG capsule Take 20 mg by mouth daily. Once daily  . metFORMIN (GLUCOPHAGE-XR) 500 MG 24 hr tablet Take 1,000 mg by mouth 2 (two) times daily. 500 mg twice daily    FAMILY HISTORY:  His indicated that the status of his mother is unknown. He indicated that the status of his father is unknown.    SOCIAL HISTORY: He  reports that he has never smoked. He has never used smokeless tobacco. He reports that he drinks about 3.6 - 4.2 oz of alcohol per week . He reports that he does not use drugs.  REVIEW OF SYSTEMS:   All systems reviewed. Pertinent positive include generalized chronic pain, and chronic trach. All other systems are negative  SUBJECTIVE:   VITAL SIGNS: BP (!) 152/95 (BP Location: Left Arm)   Pulse 98   Temp 98 F (36.7 C) (Oral)   Resp 18   Ht 6\' 2"  (1.88 m)  Wt (!) 331 lb (150.1 kg)   SpO2 100%   BMI 42.50 kg/m   HEMODYNAMICS:    VENTILATOR SETTINGS:    INTAKE / OUTPUT: No intake/output data recorded.  PHYSICAL EXAMINATION: General:  obese Neuro:  intact HEENT:  PERRLA, tracheostomy site clean with no redness Cardiovascular:  Irregular, S1/S2, no MRG, + 3 EDEMA, +2 PULSES Lungs: Normal WOB, CTAB Abdomen: Obese, Soft non-tender, + BS X4 Musculoskeletal:  Hoyer for transfers Skin: venous stasis discoloration in lower extremities  LABS:  BMET  Recent Labs Lab 11/02/16 1554  NA 118*  K 4.0  CL  87*  CO2 20*  BUN 20  CREATININE 0.75  GLUCOSE 167*    Electrolytes  Recent Labs Lab 11/02/16 1554  CALCIUM 8.4*    CBC  Recent Labs Lab 11/02/16 1554  WBC 4.9  HGB 13.3  HCT 37.7*  PLT 168    Coag's  Recent Labs Lab 11/02/16 1554  INR 1.02    Sepsis Markers No results for input(s): LATICACIDVEN, PROCALCITON, O2SATVEN in the last 168 hours.  ABG No results for input(s): PHART, PCO2ART, PO2ART in the last 168 hours.  Liver Enzymes  Recent Labs Lab 11/02/16 1554  AST 32  ALT 23  ALKPHOS 96  BILITOT 1.0  ALBUMIN 4.0    Cardiac Enzymes  Recent Labs Lab 11/02/16 1554 11/02/16 2019  TROPONINI <0.03 <0.03    Glucose  Recent Labs Lab 11/02/16 2006  GLUCAP 93    Imaging Dg Chest 2 View  Result Date: 11/02/2016 CLINICAL DATA:  Chest pain and shortness of breath today. EXAM: CHEST  2 VIEW COMPARISON:  07/29/2014 and prior radiographs FINDINGS: Cardiomegaly and mild peribronchial thickening/ interstitial prominence again noted. Mediastinal fullness is unchanged from 2011. A tracheostomy tube is again identified. There is no evidence of focal airspace disease, pulmonary edema, suspicious pulmonary nodule/mass, pleural effusion, or pneumothorax. No acute bony abnormalities are identified. IMPRESSION: Cardiomegaly without evidence of acute cardiopulmonary disease. Electronically Signed   By: Harmon Pier M.D.   On: 11/02/2016 16:59   Ct Angio Chest Pe W Or Wo Contrast  Result Date: 11/02/2016 CLINICAL DATA:  Chest pain EXAM: CT ANGIOGRAPHY CHEST WITH CONTRAST TECHNIQUE: Multidetector CT imaging of the chest was performed using the standard protocol during bolus administration of intravenous contrast. Multiplanar CT image reconstructions and MIPs were obtained to evaluate the vascular anatomy. CONTRAST:  75 mL Isovue 370 COMPARISON:  Chest radiographs dated 11/02/2016 FINDINGS: Cardiovascular: Slight preferential opacification of the aortic arch.  Satisfactory opacification of the bilateral pulmonary arteries to the segmental level. Evaluation is mildly constrained by respiratory motion. No evidence of pulmonary embolism. The heart is normal in size.  No pericardial effusion. Three vessel coronary atherosclerosis. Mediastinum/Nodes: No suspicious mediastinal lymphadenopathy. Thyroid is gross unremarkable. Lungs/Pleura: Tracheostomy in satisfactory position. Evaluation of lung parenchyma is mildly constrained by respiratory motion. No suspicious pulmonary nodules. Mild mosaic attenuation. No focal consolidation. No pleural effusion or pneumothorax. Upper Abdomen: Visualized upper abdomen is notable for mild hepatic steatosis and layering tiny gallstones (series 4/ image 102). Musculoskeletal: Visualized osseous structures are within normal limits. Review of the MIP images confirms the above findings. IMPRESSION: No evidence of pulmonary embolism. No evidence of acute cardiopulmonary disease. Cholelithiasis.  Mild hepatic steatosis. Electronically Signed   By: Charline Bills M.D.   On: 11/02/2016 18:06     ASSESSMENT: Chronic respiratory failure with chronic trach OHS/OSA Hyponatremia Chest pain Afib-rate controlled  Plan Home vent settings unknown>place on SIMV with PS-RR 8,  FiO2 40%, PEEP 5 and PS 10 QHS Trach care Nebulized bronchodilator Rest of the treatment plan unchanged   FAMILY  - Updates: No family at bedside  - Inter-disciplinary family meet or Palliative Care meeting due by:  day 7   Magdalene S. [facility] ANP-BC Pulmonary and Critical Care Medicine [facility] Pager [phone] or [phone] 11/02/2016, 10:12 PM   Pt seen and examined with NP, agree with findings, assessment, and plan as amended above. 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 He was admitted to the hospital  due to dyspnea and chest heaviness. Pt is known to have a 8 Shiley Cuffless trach. Pt was admitted with afib; now on metoprolol. Significant hyponatremia multiple electrolyte abnormalites.  Will restrict free water.  Continue metoprolol. Ct chest images personally reviewed, restrictive lung disease, unremarkable lungs, no PE.   Wells Guiles, M.D.  11/03/2016

## 2016-11-03 ENCOUNTER — Inpatient Hospital Stay
Admit: 2016-11-03 | Discharge: 2016-11-03 | Disposition: A | Discharge status: Home / Self Care | Payer: Medicare HMO | Attending: Internal Medicine | Admitting: Internal Medicine

## 2016-11-03 DIAGNOSIS — I482 Chronic atrial fibrillation: Secondary | ICD-10-CM

## 2016-11-03 DIAGNOSIS — I4891 Unspecified atrial fibrillation: Secondary | ICD-10-CM

## 2016-11-03 DIAGNOSIS — E871 Hypo-osmolality and hyponatremia: Secondary | ICD-10-CM

## 2016-11-03 LAB — COMPREHENSIVE METABOLIC PANEL
ALT: 22 U/L (ref 17–63)
AST: 32 U/L (ref 15–41)
Albumin: 3.9 g/dL (ref 3.5–5.0)
Alkaline Phosphatase: 87 U/L (ref 38–126)
Anion gap: 9 (ref 5–15)
BILIRUBIN TOTAL: 1 mg/dL (ref 0.3–1.2)
BUN: 13 mg/dL (ref 6–20)
CHLORIDE: 92 mmol/L — AB (ref 101–111)
CO2: 24 mmol/L (ref 22–32)
Calcium: 8.3 mg/dL — ABNORMAL LOW (ref 8.9–10.3)
Creatinine, Ser: 0.49 mg/dL — ABNORMAL LOW (ref 0.61–1.24)
Glucose, Bld: 120 mg/dL — ABNORMAL HIGH (ref 65–99)
Potassium: 3.7 mmol/L (ref 3.5–5.1)
Sodium: 125 mmol/L — ABNORMAL LOW (ref 135–145)
TOTAL PROTEIN: 7 g/dL (ref 6.5–8.1)

## 2016-11-03 LAB — CBC
HCT: 37.1 % — ABNORMAL LOW (ref 40.0–52.0)
Hemoglobin: 13.2 g/dL (ref 13.0–18.0)
MCH: 31.3 pg (ref 26.0–34.0)
MCHC: 35.5 g/dL (ref 32.0–36.0)
MCV: 88.1 fL (ref 80.0–100.0)
PLATELETS: 156 10*3/uL (ref 150–440)
RBC: 4.22 MIL/uL — ABNORMAL LOW (ref 4.40–5.90)
RDW: 12.4 % (ref 11.5–14.5)
WBC: 4.3 10*3/uL (ref 3.8–10.6)

## 2016-11-03 LAB — GLUCOSE, CAPILLARY
Glucose-Capillary: 122 mg/dL — ABNORMAL HIGH (ref 65–99)
Glucose-Capillary: 172 mg/dL — ABNORMAL HIGH (ref 65–99)
Glucose-Capillary: 171 mg/dL — ABNORMAL HIGH (ref 65–99)
Glucose-Capillary: 177 mg/dL — ABNORMAL HIGH (ref 65–99)

## 2016-11-03 LAB — MRSA PCR SCREENING: MRSA BY PCR: NEGATIVE

## 2016-11-03 LAB — TROPONIN I

## 2016-11-03 LAB — MAGNESIUM: MAGNESIUM: 1.7 mg/dL (ref 1.7–2.4)

## 2016-11-03 LAB — PHOSPHORUS: PHOSPHORUS: 2.2 mg/dL — AB (ref 2.5–4.6)

## 2016-11-03 MED ORDER — OXYCODONE-ACETAMINOPHEN 5-325 MG PO TABS
1.0000 | ORAL_TABLET | Freq: Three times a day (TID) | ORAL | Status: DC | PRN
  Administered 2016-11-03 – 2016-11-04 (×3): 1 via ORAL
  Filled 2016-11-03 (×3): qty 1

## 2016-11-03 MED ORDER — CHLORHEXIDINE GLUCONATE 0.12% ORAL RINSE (MEDLINE KIT): 15.0000 mL | Freq: Two times a day (BID) | OROMUCOSAL | Status: DC

## 2016-11-03 MED ORDER — PERFLUTREN LIPID MICROSPHERE
1.0000 mL | INTRAVENOUS | Status: AC | PRN
  Administered 2016-11-03: 2 mL via INTRAVENOUS
  Filled 2016-11-03: qty 10

## 2016-11-03 MED ORDER — ORAL CARE MOUTH RINSE
15.0000 mL | Freq: Four times a day (QID) | OROMUCOSAL | Status: DC
  Administered 2016-11-03: 15 mL via OROMUCOSAL

## 2016-11-03 NOTE — Progress Notes (Signed)
Pt uses LTV vent nocturnally at home. [facility] [place]. I spoke to her RT who was on call but she did not have access to the specific settings other than he's in SIMV with tidal vol of "maybe 500 and a low RR"  I initiated the pt on those settings with pres. Support and he seems comfortable at this time.

## 2016-11-03 NOTE — Progress Notes (Signed)
E-Link called for patients request to restart home medication of oxycodone 10/325. Pt states " I take it 3-4 times a day.". Pt states morphine does not help with his nerve pain the same way his home pain medicine does.  Will continue to assess.

## 2016-11-03 NOTE — Progress Notes (Signed)
Pt remains in stable condition at this time. Pt has tolerated room air most of shift. Pt currently back on vent at previous settings. Pt remains alert and oriented. Full assessment in EPIC. Report given to [facility], Charity fundraiserN.

## 2016-11-03 NOTE — Progress Notes (Signed)
[facility] - [place] [facility]   PATIENT NAME: [name]    MR#:  [id number]  DATE OF BIRTH:  20-Apr-1961  SUBJECTIVE:  CHIEF COMPLAINT:   Chief Complaint  Patient presents with  . Chest Pain  . Shortness of Breath   Still has some shortness of breath and cough. Wants to eat. Heart rate controlled in 70s to 80s.  REVIEW OF SYSTEMS:    Review of Systems  Constitutional: Positive for malaise/fatigue. Negative for chills and fever.  HENT: Negative for sore throat.   Eyes: Negative for blurred vision, double vision and pain.  Respiratory: Positive for cough and shortness of breath. Negative for hemoptysis and wheezing.   Cardiovascular: Negative for chest pain, palpitations, orthopnea and leg swelling.  Gastrointestinal: Negative for abdominal pain, constipation, diarrhea, heartburn, nausea and vomiting.  Genitourinary: Negative for dysuria and hematuria.  Musculoskeletal: Negative for back pain and joint pain.  Skin: Negative for rash.  Neurological: Positive for dizziness and weakness. Negative for sensory change, speech change, focal weakness and headaches.  Endo/Heme/Allergies: Does not bruise/bleed easily.  Psychiatric/Behavioral: Negative for depression. The patient is not nervous/anxious.     DRUG ALLERGIES:   Allergies  Allergen Reactions  . Cephalexin     Other reaction(s): Unknown  . Hctz  [Hydrochlorothiazide]     Other reaction(s): Other (See Comments) hyponatremia  . Hydralazine   . Penicillins Hives  . Reserpine   . Zaroxolyn [Metolazone]     Other reaction(s): Unknown    VITALS:  Blood pressure 128/87, pulse 94, temperature 98.4 F (36.9 C), temperature source Oral, resp. rate 19, height 6\' 2"  (1.88 m), weight (!) 146.7 kg (323 lb 6.6 oz), SpO2 96 %.  PHYSICAL EXAMINATION:   Physical Exam  GENERAL:  56 y.o.-year-old patient lying in the bed with no acute distress.  EYES: Pupils equal, round, reactive to light and accommodation.  No scleral icterus. Extraocular muscles intact.  HEENT: Head atraumatic, normocephalic. Oropharynx and nasopharynx clear.  NECK:  Supple, no jugular venous distention. No thyroid enlargement, no tenderness. Tracheostomy in place LUNGS: Normal breath sounds bilaterally, no wheezing, rales, rhonchi. No use of accessory muscles of respiration.  CARDIOVASCULAR: S1, S2 irregular. No murmurs, rubs, or gallops.  ABDOMEN: Soft, nontender, nondistended. Bowel sounds present. No organomegaly or mass.  EXTREMITIES: No cyanosis, clubbing or edema b/l.    NEUROLOGIC: Cranial nerves II through XII are intact. No focal Motor or sensory deficits b/l.   PSYCHIATRIC: [name].  SKIN: No obvious rash, lesion, or ulcer.   LABORATORY PANEL:   CBC  Recent Labs Lab 11/03/16 0806  WBC 4.3  HGB 13.2  HCT 37.1*  PLT 156   ------------------------------------------------------------------------------------------------------------------ Chemistries   Recent Labs Lab 11/03/16 0806  NA 125*  K 3.7  CL 92*  CO2 24  GLUCOSE 120*  BUN 13  CREATININE 0.49*  CALCIUM 8.3*  MG 1.7  AST 32  ALT 22  ALKPHOS 87  BILITOT 1.0   ------------------------------------------------------------------------------------------------------------------  Cardiac Enzymes  Recent Labs Lab 11/03/16 0806  TROPONINI <0.03   ------------------------------------------------------------------------------------------------------------------  RADIOLOGY:  Dg Chest 2 View  Result Date: 11/02/2016 CLINICAL DATA:  Chest pain and shortness of breath today. EXAM: CHEST  2 VIEW COMPARISON:  07/29/2014 and prior radiographs FINDINGS: Cardiomegaly and mild peribronchial thickening/ interstitial prominence again noted. Mediastinal fullness is unchanged from 2011. A tracheostomy tube is again identified. There is no evidence of focal airspace disease, pulmonary edema, suspicious pulmonary nodule/mass,  pleural effusion, or pneumothorax. No acute bony abnormalities are identified. IMPRESSION: Cardiomegaly without evidence of acute cardiopulmonary disease. Electronically Signed   By: Harmon Pier M.D.   On: 11/02/2016 16:59   Ct Angio Chest Pe W Or Wo Contrast  Result Date: 11/02/2016 CLINICAL DATA:  Chest pain EXAM: CT ANGIOGRAPHY CHEST WITH CONTRAST TECHNIQUE: Multidetector CT imaging of the chest was performed using the standard protocol during bolus administration of intravenous contrast. Multiplanar CT image reconstructions and MIPs were obtained to evaluate the vascular anatomy. CONTRAST:  75 mL Isovue 370 COMPARISON:  Chest radiographs dated 11/02/2016 FINDINGS: Cardiovascular: Slight preferential opacification of the aortic arch. Satisfactory opacification of the bilateral pulmonary arteries to the segmental level. Evaluation is mildly constrained by respiratory motion. No evidence of pulmonary embolism. The heart is normal in size.  No pericardial effusion. Three vessel coronary atherosclerosis. Mediastinum/Nodes: No suspicious mediastinal lymphadenopathy. Thyroid is gross unremarkable. Lungs/Pleura: Tracheostomy in satisfactory position. Evaluation of lung parenchyma is mildly constrained by respiratory motion. No suspicious pulmonary nodules. Mild mosaic attenuation. No focal consolidation. No pleural effusion or pneumothorax. Upper Abdomen: Visualized upper abdomen is notable for mild hepatic steatosis and layering tiny gallstones (series 4/ image 102). Musculoskeletal: Visualized osseous structures are within normal limits. Review of the MIP images confirms the above findings. IMPRESSION: No evidence of pulmonary embolism. No evidence of acute cardiopulmonary disease. Cholelithiasis.  Mild hepatic steatosis. Electronically Signed   By: Charline Bills M.D.   On: 11/02/2016 18:06   ASSESSMENT AND PLAN:   * New onset Atrial fibrillation On metoprolol and rate controlled at this time. Cardiology  consulted. Echocardiogram pending.  * Severe hyponatremia at 118. Improved to 125 with IV fluids. Continue fluids.  * Tracheostomy with bedtime ventilator. Pulmonary on board.  * Hypertension. Continue home medications.  * Diabetes mellitus. On Levemir and sliding scale.  * DVT prophylaxis with Lovenox  All the records are reviewed and case discussed with Care Management/Social Worker Management plans discussed with the patient, family and they are in agreement.  CODE STATUS: FULL CODE  DVT Prophylaxis: SCDs  TOTAL TIME TAKING CARE OF THIS PATIENT: 30 minutes.   POSSIBLE D/C IN 2-3 DAYS, DEPENDING ON CLINICAL CONDITION.  Milagros Loll R M.D on 11/03/2016 at 10:17 AM  Between 7am to 6pm - Pager - [phone]  After 6pm go to [email or web address] - password [email or web address]  [facility] [place] [facility]  Office  [phone]  CC: Primary care physician; Hillery Aldo, MD  Note: This dictation was prepared with Dragon dictation along with smaller phrase technology. Any transcriptional errors that result from this process are unintentional.

## 2016-11-03 NOTE — Plan of Care (Signed)
Problem: Safety: Goal: Ability to remain free from injury will improve Outcome: Progressing Pt has remained free from injury on my shift. Pt has remained free from falls.  Problem: Activity: Goal: Ability to tolerate increased activity will improve Outcome: Progressing Pt is now ambulatory in short intervals with one person assist.

## 2016-11-04 LAB — BASIC METABOLIC PANEL
ANION GAP: 9 (ref 5–15)
BUN: 16 mg/dL (ref 6–20)
CHLORIDE: 92 mmol/L — AB (ref 101–111)
CO2: 29 mmol/L (ref 22–32)
Calcium: 8.7 mg/dL — ABNORMAL LOW (ref 8.9–10.3)
Creatinine, Ser: 0.77 mg/dL (ref 0.61–1.24)
GFR calc Af Amer: >60 mL/min (ref >60)
GLUCOSE: 132 mg/dL — AB (ref 65–99)
POTASSIUM: 3.8 mmol/L (ref 3.5–5.1)
Sodium: 130 mmol/L — ABNORMAL LOW (ref 135–145)

## 2016-11-04 LAB — CBC
HCT: 38.3 % — ABNORMAL LOW (ref 40.0–52.0)
Hemoglobin: 13.5 g/dL (ref 13.0–18.0)
MCH: 31.6 pg (ref 26.0–34.0)
MCHC: 35.1 g/dL (ref 32.0–36.0)
MCV: 90 fL (ref 80.0–100.0)
PLATELETS: 181 10*3/uL (ref 150–440)
RBC: 4.26 MIL/uL — ABNORMAL LOW (ref 4.40–5.90)
RDW: 12.6 % (ref 11.5–14.5)
WBC: 5.3 10*3/uL (ref 3.8–10.6)

## 2016-11-04 LAB — ECHOCARDIOGRAM COMPLETE
Height: 74 in
Weight: 5174.64 oz

## 2016-11-04 LAB — THYROID PANEL
FREE THYROXINE INDEX: 1.3 (ref 1.2–4.9)
T3 Uptake Ratio: 27 % (ref 24–39)
T4 TOTAL: 4.9 ug/dL (ref 4.5–12.0)

## 2016-11-04 LAB — GLUCOSE, CAPILLARY
GLUCOSE-CAPILLARY: 120 mg/dL — AB (ref 65–99)
Glucose-Capillary: 184 mg/dL — ABNORMAL HIGH (ref 65–99)

## 2016-11-04 LAB — HIV ANTIBODY (ROUTINE TESTING W REFLEX): HIV SCREEN 4TH GENERATION: NONREACTIVE

## 2016-11-04 MED ORDER — CARBAMAZEPINE 200 MG PO TABS: 400.0000 mg | ORAL_TABLET | Freq: Every day | ORAL | 0 refills | Status: DC

## 2016-11-04 MED ORDER — TORSEMIDE 100 MG PO TABS: 100.0000 mg | ORAL_TABLET | Freq: Two times a day (BID) | ORAL | 0 refills | Status: DC

## 2016-11-04 MED ORDER — ATORVASTATIN CALCIUM 40 MG PO TABS: 40.0000 mg | ORAL_TABLET | Freq: Every day | ORAL | 1 refills | Status: DC

## 2016-11-04 MED ORDER — METOPROLOL TARTRATE 50 MG PO TABS: 50.0000 mg | ORAL_TABLET | Freq: Two times a day (BID) | ORAL | Status: DC

## 2016-11-04 MED ORDER — TORSEMIDE 100 MG PO TABS: 100.0000 mg | ORAL_TABLET | Freq: Two times a day (BID) | ORAL | 1 refills | Status: DC

## 2016-11-04 MED ORDER — VENLAFAXINE HCL ER 150 MG PO CP24: 150.0000 mg | ORAL_CAPSULE | Freq: Every day | ORAL | 0 refills | Status: DC

## 2016-11-04 NOTE — Discharge Summary (Signed)
PULMONARY / CRITICAL CARE MEDICINE   Name: Masyn Rostro MRN: [id number] DOB: 1961/02/06    ADMISSION DATE:  11/02/2016   CONSULTATION DATE:  11/02/16  REFERRING MD:  Dr. Leslye Peer  REASON: Nocturnal vent use  CHIEF COMPLAINT:  Chronic trach  HISTORY OF PRESENT ILLNESS:   This is a 56 y/o male with multiple comorbidities admitted with chest pain and dizziness and found to be in Afib and hyponatremic. CT angio negative for PE. He is transferred to the ICU for chronic nocturnal vent management. He has a cuffless trach. He is on RA during the day but requires PS at HS. He does not know what vent settings he uses at home. PCCM was consulted for chronic vent management. Patient si currently awake, able to cap trach and speak. States that he wants some morphine and soda. Offers no other complaints  Patient admitted for afib with RVR with CHF exacerbation Cardiology consulted and was asked to assess patient for discharge Patient does NOT meet any ICU criteria, patient has chronic Vent that he uses as needed [facility] did NOT accept him because of Chronic Vent which the patient uses at home  Cardiology Consulted Dr Nehemiah Massed stated to increase patient to Metoprolol 150 BID and follow up with Dr Clayborn Bigness as outpatient I next 1-2 days, further tests can be done as outpatient.     No current facility-administered medications on file prior to encounter.    Current Outpatient Prescriptions on File Prior to Encounter  Medication Sig  . atorvastatin (LIPITOR) 40 MG tablet Take 40 mg by mouth daily at 6 PM. Once daily  . carbamazepine (TEGRETOL) 200 MG tablet Take by mouth. 1 in the morning 2 at night  . clopidogrel (PLAVIX) 75 MG tablet Take 75 mg by mouth daily. Once daily  . enalapril (VASOTEC) 10 MG tablet Take by mouth. 1 tablet twice daily  . gabapentin (NEURONTIN) 800 MG tablet Take 800 mg by mouth 2 (two) times daily. 1 tablet twice daily  . insulin detemir (LEVEMIR) 100 unit/ml  SOLN Inject 50 Units into the skin.   Marland Kitchen insulin lispro (HUMALOG) 100 UNIT/ML injection Inject 15 Units into the skin 3 (three) times daily with meals. 40 units 3 times daily  . metoprolol (LOPRESSOR) 100 MG tablet Take by mouth. 100 mg twice daily  . omeprazole (PRILOSEC) 20 MG capsule Take 20 mg by mouth daily. Once daily  . metFORMIN (GLUCOPHAGE-XR) 500 MG 24 hr tablet Take 1,000 mg by mouth 2 (two) times daily. 500 mg twice daily    VITAL SIGNS: BP 112/85   Pulse 97   Temp 98.2 F (36.8 C) (Oral)   Resp (!) 23   Ht _0  (1.88 m)   Wt (!) 323 lb 6.6 oz (146.7 kg)   SpO2 98%   BMI 41.52 kg/m   HEMODYNAMICS:    VENTILATOR SETTINGS: Vent Mode: SIMV/PC/PS FiO2 (%):  [40 %] 40 % Set Rate:  [8 bmp] 8 bmp Vt Set:  [500 mL] 500 mL PEEP:  [5 cmH20] 5 cmH20 Pressure Support:  [10 cmH20] 10 cmH20  INTAKE / OUTPUT: I/O last 3 completed shifts: In: 6387 [P.O.:600; I.V.:1165] Out: 4676 [Urine:4676]  PHYSICAL EXAMINATION: General:  obese Neuro:  intact HEENT:  PERRLA, tracheostomy site clean with no redness Cardiovascular:  Irregular, S1/S2, no MRG, + 3 EDEMA, +2 PULSES Lungs: Normal WOB, CTAB Abdomen: Obese, Soft non-tender, + BS X4 Musculoskeletal:  Hoyer for transfers Skin: venous stasis discoloration in lower extremities  LABS:  BMET  Recent Labs Lab 11/02/16 1554 11/03/16 0806 11/04/16 0344  NA 118* 125* 130*  K 4.0 3.7 3.8  CL 87* 92* 92*  CO2 20* 24 29  BUN _0 CREATININE 0.75 0.49* 0.77  GLUCOSE 167* 120* 132*    Electrolytes  Recent Labs Lab 11/02/16 1554 11/03/16 0806 11/04/16 0344  CALCIUM 8.4* 8.3* 8.7*  MG  --  1.7  --   PHOS  --  2.2*  --     CBC  Recent Labs Lab 11/02/16 1554 11/03/16 0806 11/04/16 0344  WBC 4.9 4.3 5.3  HGB 13.3 13.2 13.5  HCT 37.7* 37.1* 38.3*  PLT 168 156 181    Coag's  Recent Labs Lab 11/02/16 1554  INR 1.02    Sepsis Markers No results for input(s): LATICACIDVEN, PROCALCITON, O2SATVEN in  the last 168 hours.  ABG No results for input(s): PHART, PCO2ART, PO2ART in the last 168 hours.  Liver Enzymes  Recent Labs Lab 11/02/16 1554 11/03/16 0806  AST 32 32  ALT 23 22  ALKPHOS 96 87  BILITOT 1.0 1.0  ALBUMIN 4.0 3.9    Cardiac Enzymes  Recent Labs Lab 11/02/16 2019 11/03/16 0213 11/03/16 0806  TROPONINI <0.03 <0.03 <0.03    Glucose  Recent Labs Lab 11/03/16 0757 11/03/16 1158 11/03/16 1703 11/03/16 2144 11/04/16 0705 11/04/16 1115  GLUCAP 122* 172* 171* 177* 120* 184*    Imaging No results found.   ASSESSMENT: Chronic respiratory failure with chronic trach OHS/OSA Hyponatremia Chest pain Afib-rate controlled  Hospital Course Rate control, Chronic vent use  Current Facility-Administered Medications:  .  acetaminophen (TYLENOL) tablet 650 mg, 650 mg, Oral, Q6H PRN, 650 mg at 11/03/16 2150 **OR** acetaminophen (TYLENOL) suppository 650 mg, 650 mg, Rectal, Q6H PRN, Idelle Crouch, MD .  atorvastatin (LIPITOR) tablet 40 mg, 40 mg, Oral, q1800, Idelle Crouch, MD, 40 mg at 11/03/16 1736 .  bisacodyl (DULCOLAX) suppository 10 mg, 10 mg, Rectal, Daily PRN, Idelle Crouch, MD .  carbamazepine (TEGRETOL) tablet 200 mg, 200 mg, Oral, Daily, 200 mg at 11/04/16 0955 **AND** carbamazepine (TEGRETOL) tablet 400 mg, 400 mg, Oral, Q2200, Idelle Crouch, MD, 400 mg at 11/03/16 2151 .  chlorhexidine gluconate (MEDLINE KIT) (PERIDEX) 0.12 % solution 15 mL, 15 mL, Mouth Rinse, BID, Ramachandran, Pradeep, MD .  clopidogrel (PLAVIX) tablet 75 mg, 75 mg, Oral, Daily, Idelle Crouch, MD, 75 mg at 11/04/16 0956 .  docusate sodium (COLACE) capsule 100 mg, 100 mg, Oral, BID, Idelle Crouch, MD, 100 mg at 11/04/16 0956 .  enalapril (VASOTEC) tablet 10 mg, 10 mg, Oral, BID, Idelle Crouch, MD, 10 mg at 11/04/16 0954 .  enoxaparin (LOVENOX) injection 40 mg, 40 mg, Subcutaneous, BID, Idelle Crouch, MD, 40 mg at 11/04/16 0953 .  gabapentin  (NEURONTIN) capsule 800 mg, 800 mg, Oral, BID, Idelle Crouch, MD, 800 mg at 11/04/16 0955 .  insulin aspart (novoLOG) injection 0-9 Units, 0-9 Units, Subcutaneous, TID WC, Idelle Crouch, MD, 2 Units at 11/04/16 1217 .  insulin detemir (LEVEMIR) injection 50 Units, 50 Units, Subcutaneous, BID AC, Sharia Reeve, MD, 50 Units at 11/04/16 0802 .  ipratropium-albuterol (DUONEB) 0.5-2.5 (3) MG/3ML nebulizer solution 3 mL, 3 mL, Nebulization, QID, Sparks, Leonie Douglas, MD, 3 mL at 11/04/16 1113 .  MEDLINE mouth rinse, 15 mL, Mouth Rinse, QID, [name], MD, 15 mL at 11/03/16 1231 .  metoprolol (LOPRESSOR) tablet 100 mg, 100 mg, Oral, BID, Sparks,  Leonie Douglas, MD, 100 mg at 11/04/16 0955 .  metoprolol tartrate (LOPRESSOR) tablet 50 mg, 50 mg, Oral, BID, Corey Skains, MD .  nitroGLYCERIN (NITROSTAT) SL tablet 0.4 mg, 0.4 mg, Sublingual, Q5 min PRN, Idelle Crouch, MD .  ondansetron (ZOFRAN) tablet 4 mg, 4 mg, Oral, Q6H PRN **OR** ondansetron (ZOFRAN) injection 4 mg, 4 mg, Intravenous, Q6H PRN, Idelle Crouch, MD, 4 mg at 11/03/16 2050 .  oxyCODONE-acetaminophen (PERCOCET/ROXICET) 5-325 MG per tablet 1 tablet, 1 tablet, Oral, Q8H PRN, Sharia Reeve, MD, 1 tablet at 11/04/16 0956 .  pantoprazole (PROTONIX) EC tablet 40 mg, 40 mg, Oral, QAC breakfast, Idelle Crouch, MD, 40 mg at 11/04/16 0800 .  torsemide (DEMADEX) tablet 100 mg, 100 mg, Oral, BID, Idelle Crouch, MD, 100 mg at 11/04/16 0800 .  venlafaxine XR (EFFEXOR-XR) 24 hr capsule 150 mg, 150 mg, Oral, Q breakfast, Sparks, Leonie Douglas, MD, 150 mg at 11/04/16 0801   Metoprolol increased to 150 mg PO BID Demedex 100 mg by mouth BID   Patient to be discharged home today. Patient does NOT meet ICU criteria.     Corrin Parker, M.D.  [facility]  Medical Director [place] Director [facility] Cardio-Pulmonary Department

## 2016-11-04 NOTE — Progress Notes (Signed)
[facility] - [place] [facility]   PATIENT NAME: [name]    MR#:  [id number]  DATE OF BIRTH:  15-Oct-1960  SUBJECTIVE:  CHIEF COMPLAINT:   Chief Complaint  Patient presents with  . Chest Pain  . Shortness of Breath   Some nausea, no other complains. Heart rate controlled in 70s to 80s.  REVIEW OF SYSTEMS:    Review of Systems  Constitutional: Positive for malaise/fatigue. Negative for chills and fever.  HENT: Negative for sore throat.   Eyes: Negative for blurred vision, double vision and pain.  Respiratory: Positive for cough and shortness of breath. Negative for hemoptysis and wheezing.   Cardiovascular: Negative for chest pain, palpitations, orthopnea and leg swelling.  Gastrointestinal: Negative for abdominal pain, constipation, diarrhea, heartburn, nausea and vomiting.  Genitourinary: Negative for dysuria and hematuria.  Musculoskeletal: Negative for back pain and joint pain.  Skin: Negative for rash.  Neurological: Positive for dizziness and weakness. Negative for sensory change, speech change, focal weakness and headaches.  Endo/Heme/Allergies: Does not bruise/bleed easily.  Psychiatric/Behavioral: Negative for depression. The patient is not nervous/anxious.     DRUG ALLERGIES:   Allergies  Allergen Reactions  . Cephalexin     Other reaction(s): Unknown  . Hctz  [Hydrochlorothiazide]     Other reaction(s): Other (See Comments) hyponatremia  . Hydralazine   . Penicillins Hives  . Reserpine   . Zaroxolyn [Metolazone]     Other reaction(s): Unknown    VITALS:  Blood pressure 105/75, pulse 99, temperature 98.2 F (36.8 C), temperature source Oral, resp. rate 19, height 6\' 2"  (1.88 m), weight (!) 146.7 kg (323 lb 6.6 oz), SpO2 95 %.  PHYSICAL EXAMINATION:   Physical Exam  GENERAL:  56 y.o.-year-old patient lying in the bed with no acute distress.  EYES: Pupils equal, round, reactive to light and accommodation. No scleral icterus.  Extraocular muscles intact.  HEENT: Head atraumatic, normocephalic. Oropharynx and nasopharynx clear.  NECK:  Supple, no jugular venous distention. No thyroid enlargement, no tenderness. Tracheostomy in place LUNGS: Normal breath sounds bilaterally, no wheezing, rales, rhonchi. No use of accessory muscles of respiration.  CARDIOVASCULAR: S1, S2 irregular. No murmurs, rubs, or gallops.  ABDOMEN: Soft, nontender, nondistended. Bowel sounds present. No organomegaly or mass.  EXTREMITIES: No cyanosis, clubbing or edema b/l.    NEUROLOGIC: Cranial nerves II through XII are intact. No focal Motor or sensory deficits b/l.   PSYCHIATRIC: [name].  SKIN: No obvious rash, lesion, or ulcer.   LABORATORY PANEL:   CBC  Recent Labs Lab 11/04/16 0344  WBC 5.3  HGB 13.5  HCT 38.3*  PLT 181   ------------------------------------------------------------------------------------------------------------------ Chemistries   Recent Labs Lab 11/03/16 0806 11/04/16 0344  NA 125* 130*  K 3.7 3.8  CL 92* 92*  CO2 24 29  GLUCOSE 120* 132*  BUN 13 16  CREATININE 0.49* 0.77  CALCIUM 8.3* 8.7*  MG 1.7  --   AST 32  --   ALT 22  --   ALKPHOS 87  --   BILITOT 1.0  --    ------------------------------------------------------------------------------------------------------------------  Cardiac Enzymes  Recent Labs Lab 11/03/16 0806  TROPONINI <0.03   ------------------------------------------------------------------------------------------------------------------  RADIOLOGY:  Dg Chest 2 View  Result Date: 11/02/2016 CLINICAL DATA:  Chest pain and shortness of breath today. EXAM: CHEST  2 VIEW COMPARISON:  07/29/2014 and prior radiographs FINDINGS: Cardiomegaly and mild peribronchial thickening/ interstitial prominence again noted. Mediastinal fullness is unchanged from 2011.  A tracheostomy tube is again identified. There is no evidence of focal airspace disease,  pulmonary edema, suspicious pulmonary nodule/mass, pleural effusion, or pneumothorax. No acute bony abnormalities are identified. IMPRESSION: Cardiomegaly without evidence of acute cardiopulmonary disease. Electronically Signed   By: Harmon PierJeffrey  Hu M.D.   On: 11/02/2016 16:59   Ct Angio Chest Pe W Or Wo Contrast  Result Date: 11/02/2016 CLINICAL DATA:  Chest pain EXAM: CT ANGIOGRAPHY CHEST WITH CONTRAST TECHNIQUE: Multidetector CT imaging of the chest was performed using the standard protocol during bolus administration of intravenous contrast. Multiplanar CT image reconstructions and MIPs were obtained to evaluate the vascular anatomy. CONTRAST:  75 mL Isovue 370 COMPARISON:  Chest radiographs dated 11/02/2016 FINDINGS: Cardiovascular: Slight preferential opacification of the aortic arch. Satisfactory opacification of the bilateral pulmonary arteries to the segmental level. Evaluation is mildly constrained by respiratory motion. No evidence of pulmonary embolism. The heart is normal in size.  No pericardial effusion. Three vessel coronary atherosclerosis. Mediastinum/Nodes: No suspicious mediastinal lymphadenopathy. Thyroid is gross unremarkable. Lungs/Pleura: Tracheostomy in satisfactory position. Evaluation of lung parenchyma is mildly constrained by respiratory motion. No suspicious pulmonary nodules. Mild mosaic attenuation. No focal consolidation. No pleural effusion or pneumothorax. Upper Abdomen: Visualized upper abdomen is notable for mild hepatic steatosis and layering tiny gallstones (series 4/ image 102). Musculoskeletal: Visualized osseous structures are within normal limits. Review of the MIP images confirms the above findings. IMPRESSION: No evidence of pulmonary embolism. No evidence of acute cardiopulmonary disease. Cholelithiasis.  Mild hepatic steatosis. Electronically Signed   By: Charline BillsSriyesh  Krishnan M.D.   On: 11/02/2016 18:06   ASSESSMENT AND PLAN:   * New onset Atrial fibrillation On  metoprolol and rate controlled at this time. Cardiology consulted. Echocardiogram done- EF 60%, no thrombus. As rate is controlled, may d/c after cardiac recommendations today. Pt is physically in ICU, so as per hospital policies- will be d/c by ICU team.  * Severe hyponatremia at 118. Improved to 130 with IV fluids. Continue fluids.  * Tracheostomy with bedtime ventilator. Pulmonary on board.  * Hypertension. Continue home medications.  * Diabetes mellitus. On Levemir and sliding scale.  * DVT prophylaxis with Lovenox  All the records are reviewed and case discussed with Care Management/Social Worker Management plans discussed with the patient, family and they are in agreement.  CODE STATUS: FULL CODE  DVT Prophylaxis: SCDs  TOTAL TIME TAKING CARE OF THIS PATIENT: 30 minutes.   POSSIBLE D/C IN 1 DAYS, DEPENDING ON CLINICAL CONDITION.  Altamese DillingVACHHANI, Ranita Stjulien M.D on 11/04/2016 at 4:06 PM  Between 7am to 6pm - Pager - [phone]  After 6pm go to [email or web address] - password [email or web address]  [facility]  Office  [phone]  CC: Primary care physician; Hillery AldoPatel, Sarah, MD  Note: This dictation was prepared with Dragon dictation along with smaller phrase technology. Any transcriptional errors that result from this process are unintentional.

## 2016-11-04 NOTE — Consult Note (Signed)
[facility] Consultation Note  Patient ID: [name], MRN: [id number], DOB/AGE: 1961-05-15 56 y.o. Admit date: 11/02/2016   Date of Consult: 11/04/2016 Primary Physician: Denton Lank, MD Primary Cardiologist: [facility]  Chief Complaint:  Chief Complaint  Patient presents with  . Chest Pain  . Shortness of Breath   Reason for Consult: atrial flutter with chest pain  HPI: 56 y.o. male with known diastolic dysfunction congestive heart failure diabetes with complication sleep apnea status post tracheotomy essential hypertension mixed hyperlipidemia having acute episodes of irregular heart beat shortness of breath palpitations and significant left chest pain and pressure. This was resolved when the patient got additional medication management and oxygenation. The patient had an EKG showing atrial flutter with rapid ventricular rate for which is now slightly improved with current medication management. The patient also had a normal troponin without evidence of significant myocardial infarction. There was no evidence currently of worsening heart failure although hypoxia could have contributed to significant symptoms. The patient has previously been on appropriate medication management for hypertension control and this included metoprolol. The patient has diabetes which was relatively well controlled but he also had noted a low sodium upon admission which has improved. Additionally the patient has had previous vascular disease on Plavix and anti-lipid medication management. Currently the patient significantly improved but still feels somewhat funny. There is no current evidence of true angina and/or significant symptoms of atrial flutter  Past Medical History:  Diagnosis Date  . CHF (congestive heart failure) ([place])   . Diabetes mellitus ([place])   . Diabetic peripheral neuropathy ([place])   . Elevated liver enzymes    fatty liver per [facility]  . GERD (gastroesophageal reflux  disease)   . History of pulmonary embolus (PE)   . Hyperlipidemia   . Hypertension   . Myocardial infarct ([place])   . OSA (obstructive sleep apnea)   . 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 Renal insufficiency       Surgical History:  Past Surgical History:  Procedure Laterality Date  . CARDIAC SURGERY     With stent placement  . TRACHEOSTOMY  2007  . VASECTOMY       Home Meds: Prior to Admission medications   Medication Sig Start Date End Date Taking? Authorizing Provider  atorvastatin (LIPITOR) 40 MG tablet Take 40 mg by mouth daily at 6 PM. Once daily   Yes [provider]  carbamazepine (TEGRETOL) 200 MG tablet Take by mouth. 1 in the morning 2 at night   Yes [provider]  clopidogrel (PLAVIX) 75 MG tablet Take 75 mg by mouth daily. Once daily   Yes [provider]  enalapril (VASOTEC) 10 MG tablet Take by mouth. 1 tablet twice daily   Yes [provider]  gabapentin (NEURONTIN) 800 MG tablet Take 800 mg by mouth 2 (two) times daily. 1 tablet twice daily 01/24/14  Yes [provider]  insulin detemir (LEVEMIR) 100 unit/ml SOLN Inject 50 Units into the skin.    Yes [provider]  insulin lispro (HUMALOG) 100 UNIT/ML injection Inject 15 Units into the skin 3 (three) times daily with meals. 40 units 3 times daily   Yes [provider]  metoprolol (LOPRESSOR) 100 MG tablet Take by mouth. 100 mg twice daily   Yes [provider]  omeprazole (PRILOSEC) 20 MG capsule Take 20 mg by mouth daily. Once daily   Yes [provider]  metFORMIN (  GLUCOPHAGE-XR) 500 MG 24 hr tablet Take 1,000 mg by mouth 2 (two) times daily. 500 mg twice daily 01/24/14   [provider]    Inpatient Medications:  . atorvastatin  40 mg Oral q1800  . carbamazepine  200 mg Oral Daily   And  . carbamazepine  400 mg Oral Q2200  . chlorhexidine gluconate (MEDLINE KIT)  15 mL Mouth Rinse BID   . clopidogrel  75 mg Oral Daily  . docusate sodium  100 mg Oral BID  . enalapril  10 mg Oral BID  . enoxaparin (LOVENOX) injection  40 mg Subcutaneous BID  . gabapentin  800 mg Oral BID  . insulin aspart  0-9 Units Subcutaneous TID WC  . insulin detemir  50 Units Subcutaneous BID AC  . ipratropium-albuterol  3 mL Nebulization QID  . mouth rinse  15 mL Mouth Rinse QID  . metoprolol tartrate  100 mg Oral BID  . pantoprazole  40 mg Oral QAC breakfast  . torsemide  100 mg Oral BID  . venlafaxine XR  150 mg Oral Q breakfast     Allergies:  Allergies  Allergen Reactions  . Cephalexin     Other reaction(s): Unknown  . Hctz  [Hydrochlorothiazide]     Other reaction(s): Other (See Comments) hyponatremia  . Hydralazine   . Penicillins Hives  . Reserpine   . Zaroxolyn [Metolazone]     Other reaction(s): Unknown    Social History   Social History  . Marital status: Single    Spouse name: N/A  . Number of children: N/A  . Years of education: N/A   Occupational History  . retired    Social History Main Topics  . Smoking status: Never Smoker  . Smokeless tobacco: Never Used  . Alcohol use 3.6 - 4.2 oz/week    6 - 7 Cans of beer per week     Comment: Pt states 6-7 a night  . Drug use: No  . Sexual activity: Not on file   Other Topics Concern  . Not on file   Social History Narrative  . No narrative on file     Family History  Problem Relation Age of Onset  . Heart attack Mother   . Coronary artery disease Father   . Heart attack Father      Review of Systems Positive for Weakness fatigue and palpitations chest pain Negative for: General:  chills, fever, night sweats or weight changes.  Cardiovascular: PND orthopnea syncope dizziness  Dermatological skin lesions rashes Respiratory: Cough congestion Urologic: Frequent urination urination at night and hematuria Abdominal: negative for nausea, vomiting, diarrhea, bright red blood per rectum, melena, or  hematemesis Neurologic: negative for visual changes, and/or hearing changes  All other systems reviewed and are otherwise negative except as noted above.  Labs:  Recent Labs  11/02/16 1554 11/02/16 2019 11/03/16 0213 11/03/16 0806  TROPONINI <0.03 <0.03 <0.03 <0.03   Lab Results  Component Value Date   WBC 5.3 11/04/2016   HGB 13.5 11/04/2016   HCT 38.3 (L) 11/04/2016   MCV 90.0 11/04/2016   PLT 181 11/04/2016    Recent Labs Lab 11/03/16 0806 11/04/16 0344  NA 125* 130*  K 3.7 3.8  CL 92* 92*  CO2 24 29  BUN 13 16  CREATININE 0.49* 0.77  CALCIUM 8.3* 8.7*  PROT 7.0  --   BILITOT 1.0  --   ALKPHOS 87  --   ALT 22  --   AST 32  --  GLUCOSE 120* 132*   No results found for: CHOL, HDL, LDLCALC, TRIG No results found for: DDIMER  Radiology/Studies:  Dg Chest 2 View  Result Date: 11/02/2016 CLINICAL DATA:  Chest pain and shortness of breath today. EXAM: CHEST  2 VIEW COMPARISON:  07/29/2014 and prior radiographs FINDINGS: Cardiomegaly and mild peribronchial thickening/ interstitial prominence again noted. Mediastinal fullness is unchanged from 2011. A tracheostomy tube is again identified. There is no evidence of focal airspace disease, pulmonary edema, suspicious pulmonary nodule/mass, pleural effusion, or pneumothorax. No acute bony abnormalities are identified. IMPRESSION: Cardiomegaly without evidence of acute cardiopulmonary disease. Electronically Signed   By: Margarette Canada M.D.   On: 11/02/2016 16:59   Ct Angio Chest Pe W Or Wo Contrast  Result Date: 11/02/2016 CLINICAL DATA:  Chest pain EXAM: CT ANGIOGRAPHY CHEST WITH CONTRAST TECHNIQUE: Multidetector CT imaging of the chest was performed using the standard protocol during bolus administration of intravenous contrast. Multiplanar CT image reconstructions and MIPs were obtained to evaluate the vascular anatomy. CONTRAST:  75 mL Isovue 370 COMPARISON:  Chest radiographs dated 11/02/2016 FINDINGS: Cardiovascular:  Slight preferential opacification of the aortic arch. Satisfactory opacification of the bilateral pulmonary arteries to the segmental level. Evaluation is mildly constrained by respiratory motion. No evidence of pulmonary embolism. The heart is normal in size.  No pericardial effusion. Three vessel coronary atherosclerosis. Mediastinum/Nodes: No suspicious mediastinal lymphadenopathy. Thyroid is gross unremarkable. Lungs/Pleura: Tracheostomy in satisfactory position. Evaluation of lung parenchyma is mildly constrained by respiratory motion. No suspicious pulmonary nodules. Mild mosaic attenuation. No focal consolidation. No pleural effusion or pneumothorax. Upper Abdomen: Visualized upper abdomen is notable for mild hepatic steatosis and layering tiny gallstones (series 4/ image 102). Musculoskeletal: Visualized osseous structures are within normal limits. Review of the MIP images confirms the above findings. IMPRESSION: No evidence of pulmonary embolism. No evidence of acute cardiopulmonary disease. Cholelithiasis.  Mild hepatic steatosis. Electronically Signed   By: Julian Hy M.D.   On: 11/02/2016 18:06    EKG: Atrial flutter with nonspecific status ST and T-wave changes  Weights: Filed Weights   11/02/16 1548 11/02/16 2004 11/02/16 2358  Weight: (!) 138.3 kg (305 lb) (!) 150.1 kg (331 lb) (!) 146.7 kg (323 lb 6.6 oz)     Physical Exam: Blood pressure 112/85, pulse 97, temperature 98.2 F (36.8 C), temperature source Oral, resp. rate (!) 23, height '6\' 2"'  (1.88 m), weight (!) 146.7 kg (323 lb 6.6 oz), SpO2 98 %. Body mass index is 41.52 kg/m. General: Well developed, well nourished, in no acute distress. Head eyes ears nose throat: Normocephalic, atraumatic, sclera non-icteric, no xanthomas, nares are without discharge. No apparent thyromegaly and/or mass  Lungs: Normal respiratory effort.  Diffuse wheezes, no rales, no rhonchi.  Heart: Irregular with normal S1 S2. no murmur gallop, no  rub, PMI is normal size and placement, carotid upstroke normal without bruit, jugular venous pressure is normal Abdomen: Soft, non-tender,  distended with normoactive bowel sounds. No hepatomegaly. No rebound/guarding. No obvious abdominal masses. Abdominal aorta is normal size without bruit Extremities: Trace to 1+ edema. no cyanosis, no clubbing, no ulcers  Peripheral : 2+ bilateral upper extremity pulses, 2+ bilateral femoral pulses, 2+ bilateral dorsal pedal pulse Neuro: Alert and oriented. No facial asymmetry. No focal deficit. Moves all extremities spontaneously. Musculoskeletal: Normal muscle tone without kyphosis Psych:  Responds to questions appropriately with a normal affect.    Assessment: 56 year old male with severe sleep apnea status post tracheostomy essential hypertension diabetes with complication and chest  pain without current evidence of myocardial infarction but continued rapid heartbeat of atrial flutter  Plan: 1. Continue heart rate control with metoprolol 100 mg twice per day and consider increasing to 150 mg twice per day for spontaneous conversion to normal sinus rhythm after correction of the hyponatremia and other concerns 2. Recommendations for anticoagulation for further risk reduction in stroke with atrial fibrillation with a high CHA DS score and would use oral anticoagulation as outpatient until conversion to normal sinus rhythm 3. Continue hypertension control with metoprolol and enalapril 4. Echocardiogram pending 5. High intensity cholesterol therapy with atorvastatin 6. No further diagnostic testing necessary at this time but continued medication management of multiple risk factors listed above  Signed, Corey Skains M.D. [facility] 11/04/2016, 12:47 PM

## 2016-11-04 NOTE — Care Management (Addendum)
Patient admitted with new onset atrial fibrillation and hyponatremia. Patient has history of chronic trach and home ventilator which he uses prn.  Independent in his trach care and home vent management.  Able to vocalize.  Patient is able to perform his adls and denies access to transportation, medical care or medications.  Patient at present patient it is not anticipated that patient will discharge home on cost prohibitive anticoagulants. patient's mother will transport home

## 2016-11-04 NOTE — Progress Notes (Signed)
Pt discharged in stable condition by Martie LeeSabrina, RN. Pt educated on importance of following up with cardiologist. Pt verbalized to discharging nurse he will not see Dr. Juliann Paresallwood because he has a good relationship with his general practitioner. Pt re-educated about importance of following up with Dr.Callwood.  Escorted to visitor entrance by volunteers all belongings with patient. Pt's mother to transport patient home.

## 2016-11-04 NOTE — Care Management Important Message (Signed)
Important Message  Patient Details  Name: Mauri PoleJim Paul Diem MRN: [id number] Date of Birth: [birth year]   Medicare Important Message Given:  Yes Signed IM notice given    Eber HongGreene, Azelyn Batie R, RN 11/04/2016, 11:52 AM

## 2017-04-09 ENCOUNTER — Ambulatory Visit (INDEPENDENT_AMBULATORY_CARE_PROVIDER_SITE_OTHER): Payer: Medicare HMO | Admitting: Urology

## 2017-04-09 ENCOUNTER — Encounter: Payer: Self-pay | Admitting: Urology

## 2017-04-09 VITALS — BP 113/76 | HR 89 | Ht 74.0 in | Wt 301.1 lb

## 2017-04-09 DIAGNOSIS — N529 Male erectile dysfunction, unspecified: Secondary | ICD-10-CM | POA: Diagnosis not present

## 2017-04-09 NOTE — Progress Notes (Signed)
04/09/2017 3:42 PM   [name] 23-Aug-1960 [id number]  Referring provider: Hillery Aldo, MD [address] [place], [place] [address]  Chief Complaint  Patient presents with  . Erectile Dysfunction    HPI: The patient is a 56 year old gentleman with significant complex medical comorbidities including diabetes, chronic pain syndrome on opiates, CHF, pulmonary hypertension, ethanol abuse history, cardiac stent on Plavix, history of pulmonary embolism, anemia, COPD, severe peripheral neuropathy, OSA, tracheostomy on chronic vent at night, and atrial fibrillation and presents today to discuss his erectile dysfunction. He has tried Viagra in the past without much success.  He is not able to obtain erections sufficient for intercourse. He and his partner from this both very bothersome. He is very interested in finding way to obtain an erection again at this time.   PMH: Past Medical History:  Diagnosis Date  . CHF (congestive heart failure) ([facility])   . Diabetes mellitus ([facility])   . Diabetic peripheral neuropathy ([facility])   . Elevated liver enzymes    fatty liver per [facility]  . GERD (gastroesophageal reflux disease)   . History of pulmonary embolus (PE)   . Hyperlipidemia   . Hypertension   . Myocardial infarct ([facility])   . OSA (obstructive sleep apnea)   . 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 Renal insufficiency     Surgical History: Past Surgical History:  Procedure Laterality Date  . CARDIAC SURGERY     With stent placement  . TRACHEOSTOMY  2007  . VASECTOMY      Home Medications:  Allergies as of 04/09/2017      Reactions   Cephalexin    Other reaction(s): Unknown   Hctz  [hydrochlorothiazide]    Other reaction(s): Other (See Comments) hyponatremia   Hydralazine    Penicillins Hives   Reserpine    Zaroxolyn [metolazone]    Other reaction(s): Unknown      Medication List       Accurate as of 04/09/17  3:42 PM.  Always use your most recent med list.          atorvastatin 40 MG tablet Commonly known as:  LIPITOR Take 40 mg by mouth daily at 6 PM. Once daily   atorvastatin 40 MG tablet Commonly known as:  LIPITOR Take 1 tablet (40 mg total) by mouth daily at 6 PM.   carbamazepine 200 MG tablet Commonly known as:  TEGRETOL Take by mouth. 1 in the morning 2 at night   carbamazepine 200 MG tablet Commonly known as:  TEGRETOL Take 2 tablets (400 mg total) by mouth daily at 10 pm.   clopidogrel 75 MG tablet Commonly known as:  PLAVIX Take 75 mg by mouth daily. Once daily   enalapril 10 MG tablet Commonly known as:  VASOTEC Take by mouth. 1 tablet twice daily   furosemide 20 MG tablet Commonly known as:  LASIX Take 20 mg by mouth.   gabapentin 800 MG tablet Commonly known as:  NEURONTIN Take 800 mg by mouth 2 (two) times daily. 1 tablet twice daily   HUMALOG 100 UNIT/ML injection Generic drug:  insulin lispro Inject 15 Units into the skin 3 (three) times daily with meals. 40 units 3 times daily   insulin detemir 100 unit/ml Soln Commonly known as:  LEVEMIR Inject 50 Units into the skin.   metFORMIN 500 MG 24 hr tablet Commonly known as:  GLUCOPHAGE-XR Take 1,000 mg by mouth 2 (two) times  daily. 500 mg twice daily   metoprolol tartrate 100 MG tablet Commonly known as:  LOPRESSOR Take by mouth. 100 mg twice daily   omeprazole 20 MG capsule Commonly known as:  PRILOSEC Take 20 mg by mouth daily. Once daily   oxyCODONE-acetaminophen 10-325 MG tablet Commonly known as:  PERCOCET Take 1 tablet by mouth every 4 (four) hours as needed for pain.   torsemide 100 MG tablet Commonly known as:  DEMADEX Take 1 tablet (100 mg total) by mouth 2 (two) times daily.   torsemide 100 MG tablet Commonly known as:  DEMADEX Take 1 tablet (100 mg total) by mouth 2 (two) times daily. 1 tablet twice daily   venlafaxine XR 150 MG 24 hr capsule Commonly known as:  EFFEXOR-XR Take 1 capsule  (150 mg total) by mouth daily with breakfast. Once daily       Allergies:  Allergies  Allergen Reactions  . Cephalexin     Other reaction(s): Unknown  . Hctz  [Hydrochlorothiazide]     Other reaction(s): Other (See Comments) hyponatremia  . Hydralazine   . Penicillins Hives  . Reserpine   . Zaroxolyn [Metolazone]     Other reaction(s): Unknown    Family History: Family History  Problem Relation Age of Onset  . Heart attack Mother   . Coronary artery disease Father   . Heart attack Father   . Kidney cancer Neg Hx   . Kidney disease Neg Hx   . Prostate cancer Neg Hx     Social History:  reports that he has never smoked. He has never used smokeless tobacco. He reports that he drinks about 3.6 - 4.2 oz of alcohol per week . He reports that he does not use drugs.  ROS: UROLOGY Frequent Urination?: No Hard to postpone urination?: No Burning/pain with urination?: No Get up at night to urinate?: Yes Leakage of urine?: No Urine stream starts and stops?: No Trouble starting stream?: No Do you have to strain to urinate?: No Blood in urine?: No Urinary tract infection?: No Sexually transmitted disease?: No Injury to kidneys or bladder?: No Painful intercourse?: No Weak stream?: No Erection problems?: Yes Penile pain?: No  Gastrointestinal Nausea?: No Vomiting?: No Indigestion/heartburn?: No Diarrhea?: No Constipation?: Yes  Constitutional Fever: No Night sweats?: No Weight loss?: No Fatigue?: Yes  Skin Skin rash/lesions?: No Itching?: No  Eyes Blurred vision?: Yes Double vision?: No  Ears/Nose/Throat Sore throat?: No Sinus problems?: No  Hematologic/Lymphatic Swollen glands?: No Easy bruising?: No  Cardiovascular Leg swelling?: Yes Chest pain?: No  Respiratory Cough?: No Shortness of breath?: Yes  Endocrine Excessive thirst?: No  Musculoskeletal Back pain?: No Joint pain?: No  Neurological Headaches?: No Dizziness?:  Yes  Psychologic Depression?: Yes Anxiety?: Yes  Physical Exam: BP 113/76   Pulse 89   Ht 6\' 2"  (1.88 m)   Wt (!) 301 lb 1.6 oz (136.6 kg)   BMI 38.66 kg/m   Constitutional:  Alert and oriented, No acute distress. HEENT: [place] AT, moist mucus membranes.  Trachea midline, no masses. Cardiovascular: No clubbing, cyanosis, or edema. Respiratory: Normal respiratory effort, no increased work of breathing. GI: Abdomen is soft, nontender, nondistended, no abdominal masses GU: No CVA tenderness.  Skin: No rashes, bruises or suspicious lesions. Lymph: No cervical or inguinal adenopathy. Neurologic: Grossly intact, no focal deficits, moving all 4 extremities. Psychiatric: Normal mood and affect.  Laboratory Data: Lab Results  Component Value Date   WBC 5.3 11/04/2016   HGB 13.5 11/04/2016  HCT 38.3 (L) 11/04/2016   MCV 90.0 11/04/2016   PLT 181 11/04/2016    Lab Results  Component Value Date   CREATININE 0.77 11/04/2016    No results found for: PSA  No results found for: TESTOSTERONE  No results found for: HGBA1C  Urinalysis    Component Value Date/Time   COLORURINE Straw 08/14/2013 1556   APPEARANCEUR Clear 08/14/2013 1556   LABSPEC 1.013 08/14/2013 1556   PHURINE 5.0 08/14/2013 1556   GLUCOSEU >=500 08/14/2013 1556   HGBUR Negative 08/14/2013 1556   BILIRUBINUR Negative 08/14/2013 1556   KETONESUR Negative 08/14/2013 1556   PROTEINUR Negative 08/14/2013 1556   NITRITE Negative 08/14/2013 1556   LEUKOCYTESUR Negative 08/14/2013 1556     Assessment & Plan:    1. Erectile dysfunction I had a very frank conversation with the patient regarding his severe and significant cardiac and overall comorbidities. At this time, I do not think he is a candidate for any form of therapy for his erectile dysfunction until he is cleared by his cardiologist as I am not sure his heart is healthy enough for sex. At this time, the patient will speak with his cardiologist and obtain a  clearance that he is a candidate for this medication. If he is cleared, the patient would like to try Cialis 20 mg daily. We'll call us informed. If this is too expensive, we have discussed penile injection therapy, and he is interested in pursuing trimix. We would be able to order that for him and have him follow-up for teaching. He is aware of the risk for priapism from all medications and the need for emergent intervention. Again, the patient does understand will not provide any medication for his ED until he has been cleared by his cardiologist.  Return for Patient will call when he gets cardiac clearance.  Hildred Laser, MD  [facility] [address], [address] [place], [place] [address] [phone]

## 2017-06-24 HISTORY — PX: OTHER SURGICAL HISTORY: SHX169

## 2017-07-17 ENCOUNTER — Inpatient Hospital Stay
Admission: EM | Admit: 2017-07-17 | Discharge: 2017-07-20 | DRG: 641 | Disposition: A | Discharge status: Home / Self Care | Payer: Medicare HMO | Attending: Internal Medicine | Admitting: Internal Medicine

## 2017-07-17 ENCOUNTER — Other Ambulatory Visit: Payer: Self-pay | Admitting: Family Medicine

## 2017-07-17 ENCOUNTER — Encounter: Payer: Self-pay | Admitting: Emergency Medicine

## 2017-07-17 ENCOUNTER — Other Ambulatory Visit: Payer: Self-pay

## 2017-07-17 ENCOUNTER — Emergency Department: Payer: Medicare HMO

## 2017-07-17 DIAGNOSIS — Z880 Allergy status to penicillin: Secondary | ICD-10-CM | POA: Diagnosis not present

## 2017-07-17 DIAGNOSIS — R0602 Shortness of breath: Secondary | ICD-10-CM

## 2017-07-17 DIAGNOSIS — I4892 Unspecified atrial flutter: Secondary | ICD-10-CM | POA: Diagnosis present

## 2017-07-17 DIAGNOSIS — I25119 Atherosclerotic heart disease of native coronary artery with unspecified angina pectoris: Secondary | ICD-10-CM | POA: Diagnosis present

## 2017-07-17 DIAGNOSIS — J9610 Chronic respiratory failure, unspecified whether with hypoxia or hypercapnia: Secondary | ICD-10-CM | POA: Diagnosis present

## 2017-07-17 DIAGNOSIS — I252 Old myocardial infarction: Secondary | ICD-10-CM | POA: Diagnosis not present

## 2017-07-17 DIAGNOSIS — Z6839 Body mass index (BMI) 39.0-39.9, adult: Secondary | ICD-10-CM

## 2017-07-17 DIAGNOSIS — R079 Chest pain, unspecified: Secondary | ICD-10-CM

## 2017-07-17 DIAGNOSIS — I480 Paroxysmal atrial fibrillation: Secondary | ICD-10-CM | POA: Diagnosis present

## 2017-07-17 DIAGNOSIS — Z23 Encounter for immunization: Secondary | ICD-10-CM | POA: Diagnosis present

## 2017-07-17 DIAGNOSIS — Z955 Presence of coronary angioplasty implant and graft: Secondary | ICD-10-CM

## 2017-07-17 DIAGNOSIS — Z888 Allergy status to other drugs, medicaments and biological substances status: Secondary | ICD-10-CM

## 2017-07-17 DIAGNOSIS — E662 Morbid (severe) obesity with alveolar hypoventilation: Secondary | ICD-10-CM | POA: Diagnosis present

## 2017-07-17 DIAGNOSIS — Z930 Tracheostomy status: Secondary | ICD-10-CM | POA: Diagnosis not present

## 2017-07-17 DIAGNOSIS — E871 Hypo-osmolality and hyponatremia: Principal | ICD-10-CM | POA: Diagnosis present

## 2017-07-17 DIAGNOSIS — F1010 Alcohol abuse, uncomplicated: Secondary | ICD-10-CM | POA: Diagnosis present

## 2017-07-17 DIAGNOSIS — I130 Hypertensive heart and chronic kidney disease with heart failure and stage 1 through stage 4 chronic kidney disease, or unspecified chronic kidney disease: Secondary | ICD-10-CM | POA: Diagnosis present

## 2017-07-17 DIAGNOSIS — L03119 Cellulitis of unspecified part of limb: Secondary | ICD-10-CM | POA: Diagnosis present

## 2017-07-17 DIAGNOSIS — Z79899 Other long term (current) drug therapy: Secondary | ICD-10-CM | POA: Diagnosis not present

## 2017-07-17 DIAGNOSIS — Z86711 Personal history of pulmonary embolism: Secondary | ICD-10-CM | POA: Diagnosis not present

## 2017-07-17 DIAGNOSIS — E875 Hyperkalemia: Secondary | ICD-10-CM | POA: Diagnosis present

## 2017-07-17 DIAGNOSIS — Z881 Allergy status to other antibiotic agents status: Secondary | ICD-10-CM | POA: Diagnosis not present

## 2017-07-17 DIAGNOSIS — E1142 Type 2 diabetes mellitus with diabetic polyneuropathy: Secondary | ICD-10-CM | POA: Diagnosis present

## 2017-07-17 DIAGNOSIS — Z8249 Family history of ischemic heart disease and other diseases of the circulatory system: Secondary | ICD-10-CM | POA: Diagnosis not present

## 2017-07-17 DIAGNOSIS — Z7901 Long term (current) use of anticoagulants: Secondary | ICD-10-CM

## 2017-07-17 DIAGNOSIS — Z794 Long term (current) use of insulin: Secondary | ICD-10-CM | POA: Diagnosis not present

## 2017-07-17 DIAGNOSIS — I5032 Chronic diastolic (congestive) heart failure: Secondary | ICD-10-CM | POA: Diagnosis present

## 2017-07-17 DIAGNOSIS — I878 Other specified disorders of veins: Secondary | ICD-10-CM | POA: Diagnosis present

## 2017-07-17 DIAGNOSIS — Z7902 Long term (current) use of antithrombotics/antiplatelets: Secondary | ICD-10-CM | POA: Diagnosis not present

## 2017-07-17 DIAGNOSIS — I890 Lymphedema, not elsewhere classified: Secondary | ICD-10-CM | POA: Diagnosis present

## 2017-07-17 LAB — CBC
HEMATOCRIT: 38.1 % — AB (ref 40.0–52.0)
Hemoglobin: 13.1 g/dL (ref 13.0–18.0)
MCH: 31.5 pg (ref 26.0–34.0)
MCHC: 34.4 g/dL (ref 32.0–36.0)
MCV: 91.5 fL (ref 80.0–100.0)
Platelets: 212 10*3/uL (ref 150–440)
RBC: 4.16 MIL/uL — ABNORMAL LOW (ref 4.40–5.90)
RDW: 13 % (ref 11.5–14.5)
WBC: 7.4 10*3/uL (ref 3.8–10.6)

## 2017-07-17 LAB — TROPONIN I
Troponin I: <0.03 ng/mL (ref <0.03)
Troponin I: <0.03 ng/mL (ref <0.03)

## 2017-07-17 LAB — BASIC METABOLIC PANEL
Anion gap: 14 (ref 5–15)
BUN: 19 mg/dL (ref 6–20)
CHLORIDE: 83 mmol/L — AB (ref 101–111)
CO2: 25 mmol/L (ref 22–32)
Calcium: 9.5 mg/dL (ref 8.9–10.3)
Creatinine, Ser: 0.89 mg/dL (ref 0.61–1.24)
GFR calc Af Amer: >60 mL/min (ref >60)
GFR calc non Af Amer: >60 mL/min (ref >60)
Glucose, Bld: 104 mg/dL — ABNORMAL HIGH (ref 65–99)
POTASSIUM: 5.7 mmol/L — AB (ref 3.5–5.1)
Sodium: 122 mmol/L — ABNORMAL LOW (ref 135–145)

## 2017-07-17 LAB — LIPID PANEL
CHOL/HDL RATIO: 2.2 ratio
Cholesterol: 125 mg/dL (ref 0–200)
HDL: 56 mg/dL (ref >40)
LDL CALC: 53 mg/dL (ref 0–99)
TRIGLYCERIDES: 82 mg/dL (ref <150)
VLDL: 16 mg/dL (ref 0–40)

## 2017-07-17 LAB — SODIUM: Sodium: 126 mmol/L — ABNORMAL LOW (ref 135–145)

## 2017-07-17 LAB — HEMOGLOBIN A1C
HEMOGLOBIN A1C: 6.1 % — AB (ref 4.8–5.6)
Mean Plasma Glucose: 128.37 mg/dL

## 2017-07-17 LAB — MRSA PCR SCREENING: MRSA by PCR: NEGATIVE

## 2017-07-17 LAB — GLUCOSE, CAPILLARY: GLUCOSE-CAPILLARY: 85 mg/dL (ref 65–99)

## 2017-07-17 MED ORDER — SODIUM CHLORIDE 0.9 % IV BOLUS (SEPSIS)
1000.0000 mL | Freq: Once | INTRAVENOUS | Status: AC
  Administered 2017-07-17: 1000 mL via INTRAVENOUS

## 2017-07-17 MED ORDER — DEXTROSE 50 % IV SOLN
25.0000 mL | Freq: Once | INTRAVENOUS | Status: AC
  Administered 2017-07-17: 25 mL via INTRAVENOUS
  Filled 2017-07-17: qty 50

## 2017-07-17 MED ORDER — VITAMIN B-1 100 MG PO TABS
100.0000 mg | ORAL_TABLET | Freq: Every day | ORAL | Status: DC
  Administered 2017-07-18 – 2017-07-20 (×3): 100 mg via ORAL
  Filled 2017-07-17 (×3): qty 1

## 2017-07-17 MED ORDER — PATIROMER SORBITEX CALCIUM 8.4 G PO PACK
8.4000 g | PACK | Freq: Every day | ORAL | Status: DC
  Filled 2017-07-17 (×2): qty 4

## 2017-07-17 MED ORDER — PANTOPRAZOLE SODIUM 40 MG PO TBEC
40.0000 mg | DELAYED_RELEASE_TABLET | Freq: Every day | ORAL | Status: DC
  Administered 2017-07-18 – 2017-07-20 (×3): 40 mg via ORAL
  Filled 2017-07-17 (×3): qty 1

## 2017-07-17 MED ORDER — ACETAMINOPHEN 650 MG RE SUPP: 650.0000 mg | Freq: Four times a day (QID) | RECTAL | Status: DC | PRN

## 2017-07-17 MED ORDER — INSULIN ASPART 100 UNIT/ML ~~LOC~~ SOLN
0.0000 [IU] | Freq: Three times a day (TID) | SUBCUTANEOUS | Status: DC
  Administered 2017-07-18 (×2): 1 [IU] via SUBCUTANEOUS
  Administered 2017-07-18: 2 [IU] via SUBCUTANEOUS
  Administered 2017-07-19: 1 [IU] via SUBCUTANEOUS
  Administered 2017-07-19 – 2017-07-20 (×2): 2 [IU] via SUBCUTANEOUS
  Filled 2017-07-17 (×6): qty 1

## 2017-07-17 MED ORDER — PNEUMOCOCCAL VAC POLYVALENT 25 MCG/0.5ML IJ INJ
0.5000 mL | INJECTION | INTRAMUSCULAR | Status: AC
  Administered 2017-07-18: 0.5 mL via INTRAMUSCULAR
  Filled 2017-07-17: qty 0.5

## 2017-07-17 MED ORDER — MORPHINE SULFATE (PF) 4 MG/ML IV SOLN
4.0000 mg | Freq: Once | INTRAVENOUS | Status: AC
  Administered 2017-07-17: 4 mg via INTRAVENOUS
  Filled 2017-07-17: qty 1

## 2017-07-17 MED ORDER — ONDANSETRON HCL 4 MG/2ML IJ SOLN
4.0000 mg | Freq: Once | INTRAMUSCULAR | Status: AC
  Administered 2017-07-17: 4 mg via INTRAVENOUS
  Filled 2017-07-17: qty 2

## 2017-07-17 MED ORDER — ACETAMINOPHEN 325 MG PO TABS: 650.0000 mg | ORAL_TABLET | Freq: Four times a day (QID) | ORAL | Status: DC | PRN

## 2017-07-17 MED ORDER — SODIUM POLYSTYRENE SULFONATE 15 GM/60ML PO SUSP
30.0000 g | Freq: Once | ORAL | Status: AC
  Administered 2017-07-17: 30 g via ORAL
  Filled 2017-07-17: qty 120

## 2017-07-17 MED ORDER — METOPROLOL TARTRATE 50 MG PO TABS
100.0000 mg | ORAL_TABLET | Freq: Two times a day (BID) | ORAL | Status: DC
  Administered 2017-07-17 – 2017-07-20 (×6): 100 mg via ORAL
  Filled 2017-07-17 (×6): qty 2

## 2017-07-17 MED ORDER — SODIUM CHLORIDE 0.9 % IV SOLN
INTRAVENOUS | Status: DC
  Administered 2017-07-17 – 2017-07-18 (×2): via INTRAVENOUS

## 2017-07-17 MED ORDER — INSULIN ASPART 100 UNIT/ML ~~LOC~~ SOLN
10.0000 [IU] | Freq: Once | SUBCUTANEOUS | Status: AC
  Administered 2017-07-17: 10 [IU] via INTRAVENOUS
  Filled 2017-07-17: qty 1

## 2017-07-17 MED ORDER — ONDANSETRON HCL 4 MG PO TABS: 4.0000 mg | ORAL_TABLET | Freq: Four times a day (QID) | ORAL | Status: DC | PRN

## 2017-07-17 MED ORDER — OXYCODONE HCL 5 MG PO TABS
5.0000 mg | ORAL_TABLET | ORAL | Status: DC | PRN
  Administered 2017-07-17 – 2017-07-20 (×9): 5 mg via ORAL
  Filled 2017-07-17 (×9): qty 1

## 2017-07-17 MED ORDER — LORAZEPAM 2 MG/ML IJ SOLN: 1.0000 mg | Freq: Four times a day (QID) | INTRAMUSCULAR | Status: DC | PRN

## 2017-07-17 MED ORDER — GABAPENTIN 400 MG PO CAPS
800.0000 mg | ORAL_CAPSULE | Freq: Two times a day (BID) | ORAL | Status: DC
  Administered 2017-07-17 – 2017-07-20 (×6): 800 mg via ORAL
  Filled 2017-07-17 (×8): qty 2

## 2017-07-17 MED ORDER — ASPIRIN EC 81 MG PO TBEC
81.0000 mg | DELAYED_RELEASE_TABLET | Freq: Every day | ORAL | Status: DC
  Administered 2017-07-18 – 2017-07-20 (×3): 81 mg via ORAL
  Filled 2017-07-17 (×3): qty 1

## 2017-07-17 MED ORDER — LORAZEPAM 2 MG PO TABS: 0.0000 mg | ORAL_TABLET | Freq: Two times a day (BID) | ORAL | Status: DC

## 2017-07-17 MED ORDER — LORAZEPAM 1 MG PO TABS
1.0000 mg | ORAL_TABLET | Freq: Four times a day (QID) | ORAL | Status: DC | PRN
  Administered 2017-07-20: 1 mg via ORAL
  Filled 2017-07-17: qty 1

## 2017-07-17 MED ORDER — POLYETHYLENE GLYCOL 3350 17 G PO PACK: 17.0000 g | PACK | Freq: Every day | ORAL | Status: DC | PRN

## 2017-07-17 MED ORDER — ATORVASTATIN CALCIUM 20 MG PO TABS
40.0000 mg | ORAL_TABLET | Freq: Every day | ORAL | Status: DC
  Administered 2017-07-18 – 2017-07-19 (×2): 40 mg via ORAL
  Filled 2017-07-17 (×2): qty 2

## 2017-07-17 MED ORDER — ADULT MULTIVITAMIN W/MINERALS CH
1.0000 | ORAL_TABLET | Freq: Every day | ORAL | Status: DC
  Administered 2017-07-18 – 2017-07-20 (×3): 1 via ORAL
  Filled 2017-07-17 (×3): qty 1

## 2017-07-17 MED ORDER — THIAMINE HCL 100 MG/ML IJ SOLN
100.0000 mg | Freq: Every day | INTRAMUSCULAR | Status: DC
  Filled 2017-07-17 (×2): qty 2

## 2017-07-17 MED ORDER — APIXABAN 5 MG PO TABS
5.0000 mg | ORAL_TABLET | Freq: Two times a day (BID) | ORAL | Status: DC
  Administered 2017-07-17 – 2017-07-20 (×6): 5 mg via ORAL
  Filled 2017-07-17 (×6): qty 1

## 2017-07-17 MED ORDER — LORAZEPAM 2 MG PO TABS
0.0000 mg | ORAL_TABLET | Freq: Four times a day (QID) | ORAL | Status: AC
  Administered 2017-07-19: 2 mg via ORAL
  Filled 2017-07-17: qty 1

## 2017-07-17 MED ORDER — ONDANSETRON HCL 4 MG/2ML IJ SOLN: 4.0000 mg | Freq: Four times a day (QID) | INTRAMUSCULAR | Status: DC | PRN

## 2017-07-17 MED ORDER — FOLIC ACID 1 MG PO TABS
1.0000 mg | ORAL_TABLET | Freq: Every day | ORAL | Status: DC
  Administered 2017-07-18 – 2017-07-20 (×3): 1 mg via ORAL
  Filled 2017-07-17 (×3): qty 1

## 2017-07-17 MED ORDER — INSULIN DETEMIR 100 UNIT/ML ~~LOC~~ SOLN
15.0000 [IU] | Freq: Every day | SUBCUTANEOUS | Status: DC
  Administered 2017-07-17 – 2017-07-19 (×3): 15 [IU] via SUBCUTANEOUS
  Filled 2017-07-17 (×4): qty 0.15

## 2017-07-17 NOTE — Progress Notes (Signed)
Patient is complaining of chest pain. Administered oxycodone but denies relief requests morphine. Notified Dr.Willis about current state of the patient. Received orders for morphine.

## 2017-07-17 NOTE — ED Notes (Signed)
Blue top sent down to lab with save label present.

## 2017-07-17 NOTE — ED Triage Notes (Signed)
Pt comes into the ED via [facility] from [facility] where he was being seen for chest pain.  Patient is recovering from a sinus infection and he thought it was due to that .  Patient states the pain is on the left side of the ribs under his arm.  Denies any shortness of breath, dizziness, or nausea.  Patient has received 324 aspirin in route with EMS and he does have a h/o a-fib. VSS with EMS and patient in NAD at this time with even and unlabored respiration.  Patient states it is a "snagging pain when I take a deep breath".

## 2017-07-17 NOTE — Progress Notes (Signed)
ANTICOAGULATION CONSULT NOTE - Initial Consult  Pharmacy Consult for Eliquis Indication: atrial fibrillation, CHADS2VASc at least 3  Allergies  Allergen Reactions  . Cephalexin     Other reaction(s): Unknown  . Hctz  [Hydrochlorothiazide]     Other reaction(s): Other (See Comments) hyponatremia  . Hydralazine   . Penicillins Hives  . Reserpine   . Zaroxolyn [Metolazone]     Other reaction(s): Unknown    Patient Measurements: Height: 6\' 2"  (188 cm) Weight: (!) 315 lb (142.9 kg) IBW/kg (Calculated) : 82.2 Heparin Dosing Weight:   Vital Signs: Temp: 98.2 F (36.8 C) (01/24 1636) Temp Source: Oral (01/24 1636) BP: 132/69 (01/24 1636) Pulse Rate: 113 (01/24 1636)  Labs: Recent Labs    07/17/17 1635  HGB 13.1  HCT 38.1*  PLT 212  CREATININE 0.89  TROPONINI <0.03    Estimated Creatinine Clearance: 141.3 mL/min (by C-G formula based on SCr of 0.89 mg/dL).   Medical History: Past Medical History:  Diagnosis Date  . CHF (congestive heart failure) ([facility])   . Diabetes mellitus ([facility])   . Diabetic peripheral neuropathy ([facility])   . Elevated liver enzymes    fatty liver per [facility]  . GERD (gastroesophageal reflux disease)   . History of pulmonary embolus (PE)   . Hyperlipidemia   . Hypertension   . Myocardial infarct ([facility])   . OSA (obstructive sleep apnea)   . Pancreatitis    [facility]/t [facility] induced with ARDS  . Renal insufficiency     Medications:  Infusions:  . sodium chloride      Assessment: 56 yom cc CP with PMH CHF, DM, HTN, hyponatremia, MI and PE on DAPT. ED ECG interpreted as atrial flutter d/t hyponatremia (similar presentation to last admission). Pharmacy consulted to dose Eliquis for atrial fibrillation.  Goal of Therapy:    Plan:  Eliquis 5 mg po BID. Will f/u with hospitalist plan for DAPT and OAC.  Carola FrostNathan A Tawna Alwin, Pharm.D., BCPS Clinical Pharmacist 07/17/2017,6:09 PM

## 2017-07-17 NOTE — H&P (Signed)
[employer] [facility] [place] [facility]   PATIENT NAME: [name]    MR#:  [id number]  DATE OF BIRTH:  [birth year]  DATE OF ADMISSION:  07/17/2017 CX PRIMARY CARE PHYSICIAN: Hillery Aldo, MD   REQUESTING/REFERRING PHYSICIAN:  Dr Langston Masker  CHIEF COMPLAINT:   Chest pain and weakness HISTORY OF PRESENT ILLNESS:  [name]  is a 57 y.o. male with a known history of chronic diastolic heart failure, PAF on Eliquis, diabetes, and CAD who presented to PCPs office due to chest pain. Patient was given 2 nitroglycerin and 4 baby aspirin at PCP office. He was asked to come to the ER for further evaluation.  Patient is chest pain-free at this time. Patient reports over the past week he has had on and off episodes of chest pain and shortness of breath. He has also been treated for upper respiratory infection and sinus infections over the past 12 days. Patient reports that his chest pain is substernally located without radiation or other associated symptoms except for shortness of breath.  In the emergency room sodium level is 122 and troponin is negative. Chest x-ray shows no acute infiltrate. EKG shows no acute changes  PAST MEDICAL HISTORY:   Past Medical History:  Diagnosis Date  . CHF (congestive heart failure) (HCC)   . Diabetes mellitus (HCC)   . Diabetic peripheral neuropathy (HCC)   . Elevated liver enzymes    fatty liver per [facility]  . GERD (gastroesophageal reflux disease)   . History of pulmonary embolus (PE)   . Hyperlipidemia   . Hypertension   . Myocardial infarct (HCC)   . OSA (obstructive sleep apnea)   . 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 Renal insufficiency     PAST SURGICAL HISTORY:   Past Surgical History:  Procedure Laterality Date  . CARDIAC SURGERY     With stent placement  . TRACHEOSTOMY  2007  . VASECTOMY      SOCIAL HISTORY:   Social History   Tobacco Use  . Smoking status: Never Smoker  .  Smokeless tobacco: Never Used  Substance Use Topics  . Alcohol use: Yes    Alcohol/week: 3.6 - 4.2 oz    Types: 6 - 7 Cans of beer per week    Comment: Pt states 6-7 a night    FAMILY HISTORY:   Family History  Problem Relation Age of Onset  . Heart attack Mother   . Coronary artery disease Father   . Heart attack Father   . Kidney cancer Neg Hx   . Kidney disease Neg Hx   . Prostate cancer Neg Hx     DRUG ALLERGIES:   Allergies  Allergen Reactions  . Cephalexin     Other reaction(s): Unknown  . Hctz  [Hydrochlorothiazide]     Other reaction(s): Other (See Comments) hyponatremia  . Hydralazine   . Penicillins Hives  . Reserpine   . Zaroxolyn [Metolazone]     Other reaction(s): Unknown    REVIEW OF SYSTEMS:   Review of Systems  Constitutional: Positive for malaise/fatigue. Negative for chills and fever.  HENT: Negative.  Negative for ear discharge, ear pain, hearing loss, nosebleeds and sore throat.   Eyes: Negative.  Negative for blurred vision and pain.  Respiratory: Negative.  Negative for cough, hemoptysis, shortness of breath and wheezing.   Cardiovascular: Positive for chest pain. Negative for palpitations and leg swelling.  Gastrointestinal: Negative.  Negative for abdominal  pain, blood in stool, diarrhea, nausea and vomiting.  Genitourinary: Negative.  Negative for dysuria.  Musculoskeletal: Negative.  Negative for back pain.  Skin: Negative.   Neurological: Positive for weakness. Negative for dizziness, tremors, speech change, focal weakness, seizures and headaches.  Endo/Heme/Allergies: Negative.  Does not bruise/bleed easily.  Psychiatric/Behavioral: Negative.  Negative for depression, hallucinations and suicidal ideas.    MEDICATIONS AT HOME:   Prior to Admission medications   Medication Sig Start Date End Date Taking? Authorizing Provider  atorvastatin (LIPITOR) 40 MG tablet Take 40 mg by mouth daily at 6 PM. Once daily    [provider]   atorvastatin (LIPITOR) 40 MG tablet Take 1 tablet (40 mg total) by mouth daily at 6 PM. 11/04/16   Erin Fulling, MD  carbamazepine (TEGRETOL) 200 MG tablet Take by mouth. 1 in the morning 2 at night    [provider]  carbamazepine (TEGRETOL) 200 MG tablet Take 2 tablets (400 mg total) by mouth daily at 10 pm. 11/04/16   Erin Fulling, MD  clopidogrel (PLAVIX) 75 MG tablet Take 75 mg by mouth daily. Once daily    [provider]  enalapril (VASOTEC) 10 MG tablet Take by mouth. 1 tablet twice daily    [provider]  furosemide (LASIX) 20 MG tablet Take 20 mg by mouth.    [provider]  gabapentin (NEURONTIN) 800 MG tablet Take 800 mg by mouth 2 (two) times daily. 1 tablet twice daily 01/24/14   [provider]  insulin detemir (LEVEMIR) 100 unit/ml SOLN Inject 50 Units into the skin.     [provider]  insulin lispro (HUMALOG) 100 UNIT/ML injection Inject 15 Units into the skin 3 (three) times daily with meals. 40 units 3 times daily    [provider]  metFORMIN (GLUCOPHAGE-XR) 500 MG 24 hr tablet Take 1,000 mg by mouth 2 (two) times daily. 500 mg twice daily 01/24/14   [provider]  metoprolol (LOPRESSOR) 100 MG tablet Take by mouth. 100 mg twice daily    [provider]  omeprazole (PRILOSEC) 20 MG capsule Take 20 mg by mouth daily. Once daily    [provider]  oxyCODONE-acetaminophen (PERCOCET) 10-325 MG tablet Take 1 tablet by mouth every 4 (four) hours as needed for pain.    [provider]  torsemide (DEMADEX) 100 MG tablet Take 1 tablet (100 mg total) by mouth 2 (two) times daily. Patient not taking: Reported on 04/09/2017 11/04/16   Erin Fulling, MD  torsemide (DEMADEX) 100 MG tablet Take 1 tablet (100 mg total) by mouth 2 (two) times daily. 1 tablet twice daily Patient not taking: Reported on 04/09/2017 11/04/16   Erin Fulling, MD  venlafaxine XR (EFFEXOR-XR) 150 MG 24 hr capsule Take 1  capsule (150 mg total) by mouth daily with breakfast. Once daily Patient not taking: Reported on 04/09/2017 11/04/16   Erin Fulling, MD      VITAL SIGNS:  Blood pressure 132/69, pulse (!) 113, temperature 98.2 F (36.8 C), temperature source Oral, resp. rate 20, height 6\' 2"  (1.88 m), weight (!) 142.9 kg (315 lb), SpO2 98 %.  PHYSICAL EXAMINATION:   Physical Exam  Constitutional: He is oriented to person, place, and time and well-developed, well-nourished, and in no distress. No distress.  HENT:  Head: Normocephalic.  Eyes: No scleral icterus.  Neck: Normal range of motion. Neck supple. No JVD present. No tracheal deviation present.  trach  Cardiovascular: Normal rate, regular rhythm and normal  heart sounds. Exam reveals no gallop and no friction rub.  No murmur heard. Pulmonary/Chest: Effort normal and breath sounds normal. No respiratory distress. He has no wheezes. He has no rales. He exhibits no tenderness.  Abdominal: Soft. Bowel sounds are normal. He exhibits no distension and no mass. There is no tenderness. There is no rebound and no guarding.  Musculoskeletal: Normal range of motion. He exhibits edema.  Neurological: He is alert and oriented to person, place, and time.  Skin: Skin is warm. No rash noted. There is erythema.  Redness both legs chronic changes  Psychiatric: Affect and judgment normal.      LABORATORY PANEL:   CBC Recent Labs  Lab 07/17/17 1635  WBC 7.4  HGB 13.1  HCT 38.1*  PLT 212   ------------------------------------------------------------------------------------------------------------------  Chemistries  Recent Labs  Lab 07/17/17 1635  NA 122*  K 5.7*  CL 83*  CO2 25  GLUCOSE 104*  BUN 19  CREATININE 0.89  CALCIUM 9.5   ------------------------------------------------------------------------------------------------------------------  Cardiac Enzymes Recent Labs  Lab 07/17/17 1635  TROPONINI <0.03    ------------------------------------------------------------------------------------------------------------------  RADIOLOGY:  Dg Chest 2 View  Result Date: 07/17/2017 CLINICAL DATA:  Left-sided rib pain. Patient is recovering from a sinus infection. EXAM: CHEST  2 VIEW COMPARISON:  None. FINDINGS: Borderline cardiomegaly with minimal aortic atherosclerosis. Tracheostomy tube tip is centered over the upper trachea. Lungs are clear without pneumonic consolidation. No acute osseous abnormality. IMPRESSION: Borderline cardiomegaly with aortic atherosclerosis. No active pulmonary disease. Electronically Signed   By: Tollie Ethavid  Kwon M.D.   On: 07/17/2017 17:21    EKG:  Incomplete right bundle branch block  IMPRESSION AND PLAN:   57 year old male with chronic diastolic heart failure, diabetes and PAF on anticoagulation who presented to PCPs office with chest pain and generalized weakness.  1. Severe hyponatremia with history of hyponatremia in the past to diuretics/Etoh abuse Sodium level every 6 hours Start IV fluids Nephrology consultation Discontinue Lasix for now  2. Hyperkalemia: Treat with Kayexalate, insulin and dextrose Repeat BMP in 6 hours  3. Chest pain with history of CAD: Follow telemetry and troponins [facility] consultation requested for further evaluation Continue Plavix, atorvastatin, metoprolol   4. PAF: Continue anticoagulation with Eliquis He reports that he has been off of his anticoagulation and was taking Plavix recently because [facility] ran out of Eliquis. But we will need to restart this.  5. Diabetes: Blood sugars are low currently and therefore I will start sliding scale with ADA diet and decreased dose of Levemir to 5 units daily. Diabetes nurse consultation requested  6. EtOh Abuse: CIWA protocol  7. OSA/obseity hypoventilation with chronic trach: He uses CPAP at night  8. Chronic lower extremity edema/lymphedema with chronic venous stasis  changes: Wound care consult requested.   All the records are reviewed and case discussed with ED provider. Management plans discussed with the patient and he is in agreement  CODE STATUS: FULL  TOTAL TIME TAKING CARE OF THIS PATIENT: 43 minutes.    Elgie Landino M.D on 07/17/2017 at 5:55 PM  Between 7am to 6pm - Pager - [phone]  After 6pm go to [email or web address] - Social research officer, governmentpassword EPAS [email or web address]  [facility] [facility]  Office  [phone]  CC: Primary care physician; Hillery AldoPatel, Sarah, MD

## 2017-07-17 NOTE — ED Notes (Signed)
Pt states has trouble with his sodium running low and has been admitted in the past for it. States went to pcp and the chest pain is the left axilla and is painful with pressing "like he has a bruise". Hx of afib.

## 2017-07-17 NOTE — Progress Notes (Addendum)
Patient admitted at [id number]. Patient has trach, safety equipment is at the bedside. Respiratory therapist have been notified about patient's admission. Patient has refused trach care. Patient does not have an inner cannula. Patient has diabetic ulcer to her lower legs, that have been cleaned and dressed. Notified Dr.Willis about patient's bipap requirement at bedtime, orders placed. Will continue to monitor and assess.

## 2017-07-17 NOTE — ED Provider Notes (Signed)
[facility] Emergency Department Provider Note  ___________________________________________   First MD Initiated Contact with Patient 07/17/17 1720     (approximate)  I have reviewed the triage vital signs and the nursing notes.   HISTORY  Chief Complaint Chest Pain   HPI [name] is a 57 y.o. male history of CHF, diabetes, hyponatremia as well as myocardial infarction and PE on Plavix who is presenting to the emergency department there with left-sided chest pain.  He says the pain is been ongoing for 2 days and comes and goes throughout the day.  He says that the pain is aching and an 8 out of 10 at this time to the left side of his chest.  He says it worsens with coughing.  Says that his cough is intermittently productive.  Has been placed on both steroids and Z-Pak 1 week ago without improvement.  Denies any nausea, vomiting or diaphoresis.  Says that he is concerned about heart attack.  Says that he was in atrial fibrillation once before when his sodium was low and he was admitted to the hospital.   Past Medical History:  Diagnosis Date  . CHF (congestive heart failure) (HCC)   . Diabetes mellitus (HCC)   . Diabetic peripheral neuropathy (HCC)   . Elevated liver enzymes    fatty liver per [facility]  . GERD (gastroesophageal reflux disease)   . History of pulmonary embolus (PE)   . Hyperlipidemia   . Hypertension   . Myocardial infarct (HCC)   . OSA (obstructive sleep apnea)   . 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 Renal insufficiency     Patient Active Problem List   Diagnosis Date Noted  . Atrial fibrillation (HCC) 11/02/2016  . Chest pain 11/02/2016  . Hyponatremia 11/02/2016  . Chronic respiratory failure, unspecified whether with hypoxia or hypercapnia (HCC) 05/16/2014  . Ventilator dependence (HCC) 05/16/2014  . Obesity hypoventilation syndrome (HCC) 05/16/2014  . 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 CARDIAC SURGERY     With stent placement  . TRACHEOSTOMY  2007  . VASECTOMY      Prior to Admission medications   Medication Sig Start Date End Date Taking? Authorizing Provider  atorvastatin (LIPITOR) 40 MG tablet Take 40 mg by mouth daily at 6 PM. Once daily    [provider]  atorvastatin (LIPITOR) 40 MG tablet Take 1 tablet (40 mg total) by mouth daily at 6 PM. 11/04/16   Erin Fulling, MD  carbamazepine (TEGRETOL) 200 MG tablet Take by mouth. 1 in the morning 2 at night    [provider]  carbamazepine (TEGRETOL) 200 MG tablet Take 2 tablets (400 mg total) by mouth daily at 10 pm. 11/04/16   Erin Fulling, MD  clopidogrel (PLAVIX) 75 MG tablet Take 75 mg by mouth daily. Once daily    [provider]  enalapril (VASOTEC) 10 MG tablet Take by mouth. 1 tablet twice daily    [provider]  furosemide (LASIX) 20 MG tablet Take 20 mg by mouth.    [provider]  gabapentin (NEURONTIN) 800 MG tablet Take 800 mg by mouth 2 (two) times daily. 1 tablet twice daily 01/24/14   [provider]  insulin detemir (LEVEMIR) 100 unit/ml SOLN Inject 50 Units into the skin.     [provider]  insulin lispro (HUMALOG) 100 UNIT/ML injection Inject 15  Units into the skin 3 (three) times daily with meals. 40 units 3 times daily    [provider]  metFORMIN (GLUCOPHAGE-XR) 500 MG 24 hr tablet Take 1,000 mg by mouth 2 (two) times daily. 500 mg twice daily 01/24/14   [provider]  metoprolol (LOPRESSOR) 100 MG tablet Take by mouth. 100 mg twice daily    [provider]  omeprazole (PRILOSEC) 20 MG capsule Take 20 mg by mouth daily. Once daily    [provider]  oxyCODONE-acetaminophen (PERCOCET) 10-325 MG tablet Take 1 tablet by mouth every 4 (four) hours as needed for pain.    [provider]  torsemide (DEMADEX) 100 MG tablet Take 1  tablet (100 mg total) by mouth 2 (two) times daily. Patient not taking: Reported on 04/09/2017 11/04/16   Erin Fulling, MD  torsemide (DEMADEX) 100 MG tablet Take 1 tablet (100 mg total) by mouth 2 (two) times daily. 1 tablet twice daily Patient not taking: Reported on 04/09/2017 11/04/16   Erin Fulling, MD  venlafaxine XR (EFFEXOR-XR) 150 MG 24 hr capsule Take 1 capsule (150 mg total) by mouth daily with breakfast. Once daily Patient not taking: Reported on 04/09/2017 11/04/16   Erin Fulling, MD    Allergies Cephalexin; Hctz  [hydrochlorothiazide]; Hydralazine; Penicillins; Reserpine; and Zaroxolyn [metolazone]  Family History  Problem Relation Age of Onset  . Heart attack Mother   . Coronary artery disease Father   . Heart attack Father   . Kidney cancer Neg Hx   . Kidney disease Neg Hx   . Prostate cancer Neg Hx     Social History Social History   Tobacco Use  . Smoking status: Never Smoker  . Smokeless tobacco: Never Used  Substance Use Topics  . Alcohol use: Yes    Alcohol/week: 3.6 - 4.2 oz    Types: 6 - 7 Cans of beer per week    Comment: Pt states 6-7 a night  . Drug use: No    Review of Systems  Constitutional: No fever/chills Eyes: No visual changes. ENT: No sore throat. Cardiovascular: As above Respiratory: Denies shortness of breath. Gastrointestinal: No abdominal pain.  No nausea, no vomiting.  No diarrhea.  No constipation. Genitourinary: Negative for dysuria. Musculoskeletal: Negative for back pain. Skin: Negative for rash. Neurological: Negative for headaches, focal weakness or numbness.   ____________________________________________   PHYSICAL EXAM:  VITAL SIGNS: ED Triage Vitals  Enc Vitals Group     BP 07/17/17 1636 132/69     Pulse Rate 07/17/17 1636 (!) 113     Resp 07/17/17 1636 20     Temp 07/17/17 1636 98.2 F (36.8 C)     Temp Source 07/17/17 1636 Oral     SpO2 07/17/17 1636 98 %     Weight 07/17/17 1634 (!) 315 lb (142.9 kg)      Height 07/17/17 1634 6\' 2"  (1.88 m)     Head Circumference --      Peak Flow --      Pain Score 07/17/17 1634 7     Pain Loc --      Pain Edu? --      Excl. in GC? --     Constitutional: Alert and oriented. Well appearing and in no acute distress. Eyes: Conjunctivae are normal.  Head: Atraumatic. Nose: No congestion/rhinnorhea. Mouth/Throat: Mucous membranes are moist.  Neck: No stridor.  Patient with tracheostomy without any surrounding erythema or pus. Cardiovascular: Irregularly irregular rhythm with a normal rate. Grossly  normal heart sounds.  Reproducible chest wall tenderness over the left pectoralis major muscle as well as the axillary line without any crepitus. Respiratory: Normal respiratory effort.  No retractions. Lungs CTAB. Gastrointestinal: Soft and nontender. No distention. No CVA tenderness. Musculoskeletal: Moderate to severe bilateral lower extremity edema with thickened and hyperpigmented skin with several areas of ulceration that are superficial and moist.  But no tenderness to palpation.  Patient says that the appearance of his bilateral lower extremity's is chronic. Neurologic:  Normal speech and language. No gross focal neurologic deficits are appreciated. Skin:  Skin is warm, dry and intact. No rash noted. Psychiatric: Mood and affect are normal. Speech and behavior are normal.  ____________________________________________   LABS (all labs ordered are listed, but only abnormal results are displayed)  Labs Reviewed  BASIC METABOLIC PANEL - Abnormal; Notable for the following components:      Result Value   Sodium 122 (*)    Potassium 5.7 (*)    Chloride 83 (*)    Glucose, Bld 104 (*)    All other components within normal limits  CBC - Abnormal; Notable for the following components:   RBC 4.16 (*)    HCT 38.1 (*)    All other components within normal limits  TROPONIN I   ____________________________________________  EKG  ED ECG REPORT I, Arelia Longestavid M  Alisabeth Selkirk, the attending physician, personally viewed and interpreted this ECG.   Date: 07/17/2017  EKG Time: 1634  Rate: 98  Rhythm: atrial flutter, rate 98  Axis: Normal  Intervals:right bundle branch block  ST&T Change: No ST segment elevation or depression.  No abnormal T wave inversion. Right bundle branch block is old.  Flutter waves on previous EKG from Nov 02, 2016.  Similar appearance with sawtooth pattern and multiple waves intermittently today as well. ____________________________________________  RADIOLOGY  No acute finding on the chest x-ray. ____________________________________________   PROCEDURES  Procedure(s) performed:   .Critical Care Performed by: Myrna BlazerSchaevitz, Ruffin Lada Matthew, MD Authorized by: Myrna BlazerSchaevitz, Rocio Wolak Matthew, MD   Critical care provider statement:    Critical care time (minutes):  35   Critical care time was exclusive of:  Separately billable procedures and treating other patients   Critical care was necessary to treat or prevent imminent or life-threatening deterioration of the following conditions:  Metabolic crisis   Critical care was time spent personally by me on the following activities:  Development of treatment plan with patient or surrogate, ordering and performing treatments and interventions and ordering and review of laboratory studies    Critical Care performed:   ____________________________________________   INITIAL IMPRESSION / ASSESSMENT AND PLAN / ED COURSE  Pertinent labs & imaging results that were available during my care of the patient were reviewed by me and considered in my medical decision making (see chart for details).  Differential diagnosis includes, but is not limited to, ACS, aortic dissection, pulmonary embolism, cardiac tamponade, pneumothorax, pneumonia, pericarditis, myocarditis, GI-related causes including esophagitis/gastritis, and musculoskeletal chest wall pain.   As part of my medical decision making, I  reviewed the following data within the electronic MEDICAL RECORD NUMBER Old chart reviewed and Notes from prior ED visits  ----------------------------------------- 6:10 PM on 07/17/2017 -----------------------------------------  Patient appears to be in atrial flutter today with hyponatremia.  Similar clinical scenario to previous admission this past May.  Patient's chest pain appears to be of chest wall origin.  Very reassuring cardiac workup thus far except for the flutter.  Possible relationship between the flutter and  the hyponatremia?  Patient to be admitted to the hospital.  We will also give patient pain control with morphine and Zofran.  Signed out to Dr. Tildon Husky.  Patient is understanding of the plan willing to comply.      ____________________________________________   FINAL CLINICAL IMPRESSION(S) / ED DIAGNOSES  Chest pain.  Hyponatremia.  Atrial flutter.    NEW MEDICATIONS STARTED DURING THIS VISIT:  New Prescriptions   No medications on file     Note:  This document was prepared using Dragon voice recognition software and may include unintentional dictation errors.     Myrna Blazer, MD 07/17/17 [phone]

## 2017-07-18 DIAGNOSIS — E871 Hypo-osmolality and hyponatremia: Principal | ICD-10-CM

## 2017-07-18 LAB — BASIC METABOLIC PANEL
Anion gap: 12 (ref 5–15)
Anion gap: 15 (ref 5–15)
BUN: 17 mg/dL (ref 6–20)
BUN: 17 mg/dL (ref 6–20)
CALCIUM: 9 mg/dL (ref 8.9–10.3)
CHLORIDE: 93 mmol/L — AB (ref 101–111)
CHLORIDE: 91 mmol/L — AB (ref 101–111)
CO2: 24 mmol/L (ref 22–32)
CO2: 26 mmol/L (ref 22–32)
CREATININE: 0.86 mg/dL (ref 0.61–1.24)
CREATININE: 0.83 mg/dL (ref 0.61–1.24)
Calcium: 8.9 mg/dL (ref 8.9–10.3)
GFR calc Af Amer: >60 mL/min (ref >60)
GFR calc non Af Amer: >60 mL/min (ref >60)
GFR calc non Af Amer: >60 mL/min (ref >60)
Glucose, Bld: 126 mg/dL — ABNORMAL HIGH (ref 65–99)
Glucose, Bld: 128 mg/dL — ABNORMAL HIGH (ref 65–99)
Potassium: 3.8 mmol/L (ref 3.5–5.1)
Potassium: 3.9 mmol/L (ref 3.5–5.1)
Sodium: 129 mmol/L — ABNORMAL LOW (ref 135–145)
Sodium: 132 mmol/L — ABNORMAL LOW (ref 135–145)

## 2017-07-18 LAB — TROPONIN I: Troponin I: <0.03 ng/mL (ref <0.03)

## 2017-07-18 LAB — SODIUM
SODIUM: 132 mmol/L — AB (ref 135–145)
SODIUM: 133 mmol/L — AB (ref 135–145)
Sodium: 129 mmol/L — ABNORMAL LOW (ref 135–145)

## 2017-07-18 LAB — GLUCOSE, CAPILLARY
GLUCOSE-CAPILLARY: 117 mg/dL — AB (ref 65–99)
GLUCOSE-CAPILLARY: 188 mg/dL — AB (ref 65–99)
GLUCOSE-CAPILLARY: 139 mg/dL — AB (ref 65–99)
Glucose-Capillary: 131 mg/dL — ABNORMAL HIGH (ref 65–99)
Glucose-Capillary: 128 mg/dL — ABNORMAL HIGH (ref 65–99)

## 2017-07-18 LAB — CBC
HEMATOCRIT: 36.9 % — AB (ref 40.0–52.0)
Hemoglobin: 12.4 g/dL — ABNORMAL LOW (ref 13.0–18.0)
MCH: 31.5 pg (ref 26.0–34.0)
MCHC: 33.6 g/dL (ref 32.0–36.0)
MCV: 93.7 fL (ref 80.0–100.0)
PLATELETS: 195 10*3/uL (ref 150–440)
RBC: 3.94 MIL/uL — ABNORMAL LOW (ref 4.40–5.90)
RDW: 13.4 % (ref 11.5–14.5)
WBC: 5.1 10*3/uL (ref 3.8–10.6)

## 2017-07-18 MED ORDER — COLLAGENASE 250 UNIT/GM EX OINT
TOPICAL_OINTMENT | Freq: Every day | CUTANEOUS | Status: DC
  Administered 2017-07-18: 2 via TOPICAL
  Administered 2017-07-19 – 2017-07-20 (×2): via TOPICAL
  Filled 2017-07-18: qty 30

## 2017-07-18 MED ORDER — LAMOTRIGINE 25 MG PO TABS
125.0000 mg | ORAL_TABLET | Freq: Two times a day (BID) | ORAL | Status: DC
  Administered 2017-07-18 – 2017-07-20 (×5): 125 mg via ORAL
  Filled 2017-07-18 (×5): qty 5

## 2017-07-18 MED ORDER — MORPHINE SULFATE (PF) 2 MG/ML IV SOLN
1.0000 mg | Freq: Four times a day (QID) | INTRAVENOUS | Status: DC | PRN
  Administered 2017-07-18 – 2017-07-19 (×4): 1 mg via INTRAVENOUS
  Filled 2017-07-18 (×5): qty 1

## 2017-07-18 MED ORDER — FUROSEMIDE 10 MG/ML IJ SOLN
40.0000 mg | Freq: Once | INTRAMUSCULAR | Status: AC
  Administered 2017-07-18: 40 mg via INTRAVENOUS
  Filled 2017-07-18: qty 4

## 2017-07-18 MED ORDER — DOXYCYCLINE HYCLATE 100 MG PO TABS
100.0000 mg | ORAL_TABLET | Freq: Two times a day (BID) | ORAL | Status: DC
  Administered 2017-07-18 – 2017-07-20 (×5): 100 mg via ORAL
  Filled 2017-07-18 (×5): qty 1

## 2017-07-18 NOTE — Consult Note (Signed)
Reason for Consult: Atrial fibrillation atrial flutter chest pain Referring Physician: Dr. Brigitte Pulse hospitalist, [place] primary Cardiologist [facility] [name] is an 57 y.o. male.  HPI: Patient a 57 year old obese white male with multiple medical problems including diastolic congestive heart failure diabetes peripheral neuropathy elevated liver enzymes GERD.  Hyperlipidemia hypertension obstructive sleep apnea renal insufficiency complaint of recent sinus congestion.  Patient did receive multiple courses of different antibiotics without significant improvement.  Patient was seen by primary physician EKG in the office suggested atrial flutter so the patient was advised to come to the emergency room for evaluation.  Patient also complains of lower extremity swelling redness and discomfort.  Patient complained of vague left-sided chest discomfort denies any worsening shortness of breath no fever chills or sweats.  Past Medical History:  Diagnosis Date  . CHF (congestive heart failure) ([place])   . Diabetes mellitus ([place])   . Diabetic peripheral neuropathy ([place])   . Elevated liver enzymes    [facility]  . GERD (gastroesophageal reflux disease)   . History of pulmonary embolus (PE)   . Hyperlipidemia   . Hypertension   . Myocardial infarct ([place])   . OSA (obstructive sleep apnea)   . 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 Renal insufficiency     Past Surgical History:  Procedure Laterality Date  . CARDIAC SURGERY     With stent placement  . TRACHEOSTOMY  2007  . VASECTOMY      Family History  Problem Relation Age of Onset  . Heart attack Mother   . Coronary artery disease Father   . Heart attack Father   . Kidney cancer Neg Hx   . Kidney disease Neg Hx   . Prostate cancer Neg Hx     Social History:  reports that  has never smoked. he has never used smokeless tobacco. He reports that he drinks about 3.6 - 4.2 oz of alcohol  per week. He reports that he does not use drugs.  Allergies:  Allergies  Allergen Reactions  . Cephalexin     Other reaction(s): Unknown  . Hctz  [Hydrochlorothiazide]     Other reaction(s): Other (See Comments) hyponatremia  . Hydralazine   . Penicillins Hives  . Reserpine   . Zaroxolyn [Metolazone]     Other reaction(s): Unknown    Medications: I have reviewed the patient's current medications.  Results for orders placed or performed during the hospital encounter of 07/17/17 (from the past 48 hour(s))  Basic metabolic panel     Status: Abnormal   Collection Time: 07/17/17  4:35 PM  Result Value Ref Range   Sodium 122 (L) 135 - 145 mmol/L   Potassium 5.7 (H) 3.5 - 5.1 mmol/L   Chloride 83 (L) 101 - 111 mmol/L   CO2 25 22 - 32 mmol/L   Glucose, Bld 104 (H) 65 - 99 mg/dL   BUN 19 6 - 20 mg/dL   Creatinine, Ser 0.89 0.61 - 1.24 mg/dL   Calcium 9.5 8.9 - 10.3 mg/dL   GFR calc non Af Amer >60 >60 mL/min   GFR calc Af Amer >60 >60 mL/min    Comment: (NOTE) The eGFR has been calculated using the CKD EPI equation. This calculation has not been validated in all clinical situations. eGFR's persistently <60 mL/min signify possible Chronic Kidney Disease.    Anion gap 14 5 - 15    Comment: Performed at [facility]  [facility], [place], [place] [address]  CBC     Status: Abnormal   Collection Time: 07/17/17  4:35 PM  Result Value Ref Range   WBC 7.4 3.8 - 10.6 K/uL   RBC 4.16 (L) 4.40 - 5.90 MIL/uL   Hemoglobin 13.1 13.0 - 18.0 g/dL   HCT 38.1 (L) 40.0 - 52.0 %   MCV 91.5 80.0 - 100.0 fL   MCH 31.5 26.0 - 34.0 pg   MCHC 34.4 32.0 - 36.0 g/dL   RDW 13.0 11.5 - 14.5 %   Platelets 212 150 - 440 K/uL    Comment: Performed at [facility], [address] [place], [place] [address]  Troponin I     Status: None   Collection Time: 07/17/17  4:35 PM  Result Value Ref Range   Troponin I <0.03 <0.03 ng/mL    Comment: Performed at [facility],  [address] [place], [place] [address]  Lipid panel     Status: None   Collection Time: 07/17/17  4:35 PM  Result Value Ref Range   Cholesterol 125 0 - 200 mg/dL   Triglycerides 82 <150 mg/dL   HDL 56 >40 mg/dL   Total CHOL/HDL Ratio 2.2 RATIO   VLDL 16 0 - 40 mg/dL   LDL Cholesterol 53 0 - 99 mg/dL    Comment:        Total Cholesterol/HDL:CHD Risk Coronary Heart Disease Risk Table                     Men   Women  1/2 Average Risk   3.4   3.3  Average Risk       5.0   4.4  2 X Average Risk   9.6   7.1  3 X Average Risk  23.4   11.0        Use the calculated Patient Ratio above and the CHD Risk Table to determine the patient's CHD Risk.        ATP III CLASSIFICATION (LDL):  <100     mg/dL   Optimal  100-129  mg/dL   Near or Above                    Optimal  130-159  mg/dL   Borderline  160-189  mg/dL   High  >190     mg/dL   Very High Performed at [facility], [address] [place], [place] [address]   Hemoglobin A1c     Status: Abnormal   Collection Time: 07/17/17  4:35 PM  Result Value Ref Range   Hgb A1c MFr Bld 6.1 (H) 4.8 - 5.6 %    Comment: (NOTE) Pre diabetes:          5.7%-6.4% Diabetes:              >6.4% Glycemic control for   <7.0% adults with diabetes    Mean Plasma Glucose 128.37 mg/dL    Comment: Performed at [facility] [address] [place], [place] [address]  Glucose, capillary     Status: None   Collection Time: 07/17/17  8:26 PM  Result Value Ref Range   Glucose-Capillary 85 65 - 99 mg/dL  Troponin I     Status: None   Collection Time: 07/17/17  8:39 PM  Result Value Ref Range   Troponin I <0.03 <0.03 ng/mL    Comment: Performed at [facility], [place]  [address] [place], [place] [address]  Sodium     Status: Abnormal   Collection Time: 07/17/17  8:39 PM  Result Value Ref Range   Sodium 126 (L) 135 - 145 mmol/L    Comment: Performed at [facility], [address] [place], [place] [address]   MRSA PCR Screening     Status: None   Collection Time: 07/17/17  8:42 PM  Result Value Ref Range   MRSA by PCR NEGATIVE NEGATIVE    Comment:        The GeneXpert MRSA Assay (FDA approved for NASAL specimens only), is one component of a comprehensive MRSA colonization surveillance program. It is not intended to diagnose MRSA infection nor to guide or monitor treatment for MRSA infections. Performed at [facility], [address] [place], [place] [address]   Glucose, capillary     Status: Abnormal   Collection Time: 07/18/17  2:22 AM  Result Value Ref Range   Glucose-Capillary 117 (H) 65 - 99 mg/dL  Troponin I     Status: None   Collection Time: 07/18/17  2:30 AM  Result Value Ref Range   Troponin I <0.03 <0.03 ng/mL    Comment: Performed at [facility], [address] [place], [place] [address]  Sodium     Status: Abnormal   Collection Time: 07/18/17  2:30 AM  Result Value Ref Range   Sodium 129 (L) 135 - 145 mmol/L    Comment: Performed at [facility], [address] [place], [place] [address]  Glucose, capillary     Status: Abnormal   Collection Time: 07/18/17  8:13 AM  Result Value Ref Range   Glucose-Capillary 131 (H) 65 - 99 mg/dL  Troponin I     Status: None   Collection Time: 07/18/17  8:40 AM  Result Value Ref Range   Troponin I <0.03 <0.03 ng/mL    Comment: Performed at [facility], [address] [place], [place] [address]  Basic metabolic panel     Status: Abnormal   Collection Time: 07/18/17  8:40 AM  Result Value Ref Range   Sodium 129 (L) 135 - 145 mmol/L   Potassium 3.8 3.5 - 5.1 mmol/L   Chloride 91 (L) 101 - 111 mmol/L   CO2 26 22 - 32 mmol/L   Glucose, Bld 128 (H) 65 - 99 mg/dL   BUN 17 6 - 20 mg/dL   Creatinine, Ser 0.83 0.61 - 1.24 mg/dL   Calcium 8.9 8.9 - 10.3 mg/dL   GFR calc non Af Amer >60 >60 mL/min   GFR calc Af Amer >60 >60 mL/min    Comment: (NOTE) The eGFR has been calculated using the  CKD EPI equation. This calculation has not been validated in all clinical situations. eGFR's persistently <60 mL/min signify possible Chronic Kidney Disease.    Anion gap 12 5 - 15    Comment: Performed at [facility], [address] [place], [place] [address]  CBC     Status: Abnormal   Collection Time: 07/18/17  8:40 AM  Result Value Ref Range   WBC 5.1 3.8 - 10.6 K/uL   RBC 3.94 (L) 4.40 - 5.90 MIL/uL   Hemoglobin 12.4 (L) 13.0 - 18.0 g/dL   HCT 36.9 (L) 40.0 - 52.0 %   MCV 93.7 80.0 - 100.0 fL   MCH 31.5 26.0 - 34.0 pg   MCHC 33.6 32.0 - 36.0 g/dL   RDW 13.4 11.5 - 14.5 %  Platelets 195 150 - 440 K/uL    Comment: Performed at [facility], [address] [place], [place] [address]  Basic metabolic panel     Status: Abnormal   Collection Time: 07/18/17  8:40 AM  Result Value Ref Range   Sodium 132 (L) 135 - 145 mmol/L   Potassium 3.9 3.5 - 5.1 mmol/L   Chloride 93 (L) 101 - 111 mmol/L   CO2 24 22 - 32 mmol/L   Glucose, Bld 126 (H) 65 - 99 mg/dL   BUN 17 6 - 20 mg/dL   Creatinine, Ser 0.86 0.61 - 1.24 mg/dL   Calcium 9.0 8.9 - 10.3 mg/dL   GFR calc non Af Amer >60 >60 mL/min   GFR calc Af Amer >60 >60 mL/min    Comment: (NOTE) The eGFR has been calculated using the CKD EPI equation. This calculation has not been validated in all clinical situations. eGFR's persistently <60 mL/min signify possible Chronic Kidney Disease.    Anion gap 15 5 - 15    Comment: Performed at [facility], [address] [place], [place] [address]  Glucose, capillary     Status: Abnormal   Collection Time: 07/18/17 11:50 AM  Result Value Ref Range   Glucose-Capillary 188 (H) 65 - 99 mg/dL  Sodium     Status: Abnormal   Collection Time: 07/18/17  2:26 PM  Result Value Ref Range   Sodium 132 (L) 135 - 145 mmol/L    Comment: Performed at [facility], [address] [place], [place] [address]    Dg Chest 2 View  Result Date: 07/17/2017 CLINICAL  DATA:  Left-sided rib pain. Patient is recovering from a sinus infection. EXAM: CHEST  2 VIEW COMPARISON:  None. FINDINGS: Borderline cardiomegaly with minimal aortic atherosclerosis. Tracheostomy tube tip is centered over the upper trachea. Lungs are clear without pneumonic consolidation. No acute osseous abnormality. IMPRESSION: Borderline cardiomegaly with aortic atherosclerosis. No active pulmonary disease. Electronically Signed   By: Ashley Royalty M.D.   On: 07/17/2017 17:21    Review of Systems  Constitutional: Positive for diaphoresis and malaise/fatigue.  HENT: Positive for congestion.   Eyes: Negative.   Respiratory: Positive for shortness of breath.   Cardiovascular: Positive for chest pain, orthopnea, leg swelling and PND.  Gastrointestinal: Negative.   Genitourinary: Negative.   Musculoskeletal: Positive for myalgias.  Skin: Negative.   Neurological: Positive for dizziness and weakness.  Endo/Heme/Allergies: Negative.   Psychiatric/Behavioral: Negative.    Blood pressure 113/68, pulse 100, temperature 98.4 F (36.9 C), temperature source Oral, resp. rate 16, height '6\' 2"'  (1.88 m), weight (!) 309 lb 4.9 oz (140.3 kg), SpO2 (!) 89 %. Physical Exam  Nursing note and vitals reviewed. Constitutional: He is oriented to person, place, and time. He appears well-developed and well-nourished.  HENT:  Head: Normocephalic and atraumatic.  Eyes: Conjunctivae and EOM are normal. Pupils are equal, round, and reactive to light.  Neck: Normal range of motion. Neck supple.  Cardiovascular: Normal rate and regular rhythm.  Murmur heard. Respiratory: Effort normal and breath sounds normal.  GI: Soft. Bowel sounds are normal.  Musculoskeletal: He exhibits edema and tenderness.  Redness rash  Neurological: He is alert and oriented to person, place, and time. He has normal reflexes.  Skin: Skin is warm and dry.  Psychiatric: He has a normal mood and affect.     Assessment/Plan: Congestion SOB Atrial fibrillation atrial flutter CHF DM Obesity GERD Sinus congestion Hyperlipidemia OSA Hypertension CRI  Trach permanent . Plan Agree with admission for further assessment Atypical chest pain do not recommend further assessment at this point Continue rate control for atrial fibrillation Agree with Eliquis for anticoagulation Continue trach management Consider antibiotic therapy lower extremity possible cellulitis Consider support stockings and elevation Continue Lipitor for lipid management   Sheena Simonis D Shanti Eichel 07/18/2017, 4:42 PM

## 2017-07-18 NOTE — Progress Notes (Signed)
Inpatient Diabetes Program Recommendations  AACE/ADA: New Consensus Statement on Inpatient Glycemic Control (2015)  Target Ranges:  Prepandial:   less than 140 mg/dL      Peak postprandial:   less than 180 mg/dL (1-2 hours)      Critically ill patients:  140 - 180 mg/dL   Lab Results  Component Value Date   GLUCAP 188 (H) 07/18/2017   HGBA1C 6.1 (H) 07/17/2017    Review of Glycemic Control  Results for [name], [name] (MRN [id number]) as of 07/18/2017 13:57  Ref. Range 07/17/2017 20:26 07/18/2017 02:22 07/18/2017 08:13 07/18/2017 11:50  Glucose-Capillary Latest Ref Range: 65 - 99 mg/dL 85 401117 (H) 027131 (H) 253188 (H)    Diabetes history: Type 2 Outpatient Diabetes medications: Levemir 30-35 units bid, Humalog 0-15 units tid (as needed), Glucophage1000mg  bid  Current orders for Inpatient glycemic control: Levemir 15 units qday, Novolog 0-9 units tid  Inpatient Diabetes Program Recommendations: Consider increasing Levemir to 15 units bid and continue Novolog 0-9 units tid as ordered.   If post prandial blood sugars are elevated tomorrow, consider adding Novolog 3 units tid with meals.     Susette RacerJulie Vivan Agostino, RN, BA, MHA, CDE Diabetes Coordinator Inpatient Diabetes Program  [phone] (Team Pager) [phone] [facility] Office) 07/18/2017 2:08 PM

## 2017-07-18 NOTE — Progress Notes (Signed)
Pt placed on bipap with setting of avaps 500 22 521% tolerating well family member at the [facility] , bipap plugged int red out let

## 2017-07-18 NOTE — Progress Notes (Signed)
ANTICOAGULATION CONSULT NOTE - Initial Consult  Pharmacy Consult for Eliquis Indication: atrial fibrillation, CHADS2VASc at least 3  Allergies  Allergen Reactions  . Cephalexin     Other reaction(s): Unknown  . Hctz  [Hydrochlorothiazide]     Other reaction(s): Other (See Comments) hyponatremia  . Hydralazine   . Penicillins Hives  . Reserpine   . Zaroxolyn [Metolazone]     Other reaction(s): Unknown    Patient Measurements: Height: 6\' 2"  (188 cm) Weight: (!) 309 lb 4.9 oz (140.3 kg) IBW/kg (Calculated) : 82.2 Heparin Dosing Weight:   Vital Signs: Temp: 98.4 F (36.9 C) (01/25 0221) Temp Source: Oral (01/25 0221) BP: 132/99 (01/25 0700) Pulse Rate: 107 (01/25 0700)  Labs: Recent Labs    07/17/17 1635 07/17/17 2039 07/18/17 0230  HGB 13.1  --   --   HCT 38.1*  --   --   PLT 212  --   --   CREATININE 0.89  --   --   TROPONINI <0.03 <0.03 <0.03    Estimated Creatinine Clearance: 139.8 mL/min (by C-G formula based on SCr of 0.89 mg/dL).   Medical History: Past Medical History:  Diagnosis Date  . CHF (congestive heart failure) ([facility])   . Diabetes mellitus ([facility])   . Diabetic peripheral neuropathy ([facility])   . Elevated liver enzymes    [facility]ty liver [facility]  . GERD (gastroesophageal reflux disease)   . History of pulmonary embolus (PE)   . Hyperlipidemia   . Hypertension   . Myocardial infarct ([facility])   . OSA (obstructive sleep apnea)   . [facility]reat[facility]    [facility]t [facility] induced with ARDS  . Renal insufficiency     Medications:  Infusions:  . sodium chloride 75 mL/hr at 07/18/17 0600    Assessment: 56 yom cc CP with PMH CHF, DM, HTN, hyponatremia, MI and PE on DAPT. ED ECG interpreted as atrial flutter d/t hyponatremia (similar presentation to last admission). Pharmacy consulted to dose Eliquis for atrial fibrillation.   Plan:  Continue Eliquis 5 mg po BID.   Kenzo Ozment M Blayn Whetsell, Pharm.D., BCPS Clinical  Pharmacist 07/18/2017,8:05 AM

## 2017-07-18 NOTE — Progress Notes (Signed)
[facility] [place] at [facility]   PATIENT NAME: [name]    MR#:  [id number]  DATE OF BIRTH:  [birth year]  SUBJECTIVE:  CHIEF COMPLAINT:   Chief Complaint  Patient presents with  . Chest Pain  some better, needs vent at night REVIEW OF SYSTEMS:  Review of Systems  Constitutional: Negative for chills, fever and weight loss.  HENT: Negative for nosebleeds and sore throat.   Eyes: Negative for blurred vision.  Respiratory: Negative for cough, shortness of breath and wheezing.   Cardiovascular: Negative for chest pain, orthopnea, leg swelling and PND.  Gastrointestinal: Negative for abdominal pain, constipation, diarrhea, heartburn, nausea and vomiting.  Genitourinary: Negative for dysuria and urgency.  Musculoskeletal: Negative for back pain.  Skin: Negative for rash.  Neurological: Negative for dizziness, speech change, focal weakness and headaches.  Endo/Heme/Allergies: Does not bruise/bleed easily.  Psychiatric/Behavioral: Negative for depression.    DRUG ALLERGIES:   Allergies  Allergen Reactions  . Cephalexin     Other reaction(s): Unknown  . Hctz  [Hydrochlorothiazide]     Other reaction(s): Other (See Comments) hyponatremia  . Hydralazine   . Penicillins Hives  . Reserpine   . Zaroxolyn [Metolazone]     Other reaction(s): Unknown   VITALS:  Blood pressure (!) 146/93, pulse (!) 110, temperature 98.4 F (36.9 C), temperature source Oral, resp. rate 16, height 6\' 2"  (1.88 m), weight (!) 140.3 kg (309 lb 4.9 oz), SpO2 99 %. PHYSICAL EXAMINATION:  Physical Exam  Constitutional: He is oriented to person, place, and time and well-developed, well-nourished, and in no distress.  HENT:  Head: Normocephalic and atraumatic.  Eyes: Conjunctivae and EOM are normal. Pupils are equal, round, and reactive to light.  Neck: Normal range of motion. Neck supple. No tracheal deviation present. No thyromegaly present.  Cardiovascular: Normal rate, regular  rhythm and normal heart sounds.  Pulmonary/Chest: Effort normal and breath sounds normal. No respiratory distress. He has no wheezes. He exhibits no tenderness.  Abdominal: Soft. Bowel sounds are normal. He exhibits no distension. There is no tenderness.  Musculoskeletal: Normal range of motion. He exhibits edema.  Neurological: He is alert and oriented to person, place, and time. No cranial nerve deficit.  Skin: Skin is warm and dry. No rash noted.  Legs wrapped. dressings dry and intact  Skin: bilateral lower extremities hyperpigmented with scattered ulcerations    Psychiatric: Mood and affect normal.   LABORATORY PANEL:  Male CBC Recent Labs  Lab 07/18/17 0840  WBC 5.1  HGB 12.4*  HCT 36.9*  PLT 195   ------------------------------------------------------------------------------------------------------------------ Chemistries  Recent Labs  Lab 07/18/17 0840 07/18/17 1426  NA 132*  129* 132*  K 3.9  3.8  --   CL 93*  91*  --   CO2 24  26  --   GLUCOSE 126*  128*  --   BUN 17  17  --   CREATININE 0.86  0.83  --   CALCIUM 9.0  8.9  --    RADIOLOGY:  No results found. ASSESSMENT AND PLAN:  57 year old male with chronic diastolic heart failure, diabetes and PAF on anticoagulation who presented to PCPs office with chest pain and generalized weakness.  1. Severe acute on chronic hyponatremia due to diuretics/ETOH abuse: improving Na now 132 Sodium level every 6 hours - IV fluids - Off Lasix for now  2. Hyperkalemia: resolved s/p Kayexalate, insulin and dextrose  3. Chest pain with history of CAD: - appreciate cardio input -  Continue Plavix, atorvastatin, metoprolol  4. PAF: Continue anticoagulation with Eliquis He reports that he has been off of his anticoagulation and was taking Plavix recently because [facility] ran out of Eliquis. But we will need to restart this.  5. Diabetes:  - Levemir 15 units daily. Diabetes nurse following  6. ETOH Abuse:  continue CIWA protocol  7. OSA/obseity hypoventilation with chronic trach: CPAP at night  8. Chronic lower extremity edema/lymphedema with chronic venous stasis changes: Wound care consult  9. Chronic nocturnal ventilation via cuffless tracheostomy     All the records are reviewed and case discussed with Care Management/Social Worker. Management plans discussed with the patient, family and they are in agreement.  CODE STATUS: Full Code  TOTAL TIME TAKING CARE OF THIS PATIENT: 15 minutes.   More than 50% of the time was spent in counseling/coordination of care: Gwendolyn LimaYES    Merrit Waugh M.D on 07/18/2017 at 7:37 PM  Between 7am to 6pm - Pager - [phone]  After 6pm go to [email or web address] - Social research officer, governmentpassword [email or web address] [facility]  [facility] [place] [facility]  Office  [phone]  CC: Primary care physician; Hillery AldoPatel, Sarah, MD  Note: This dictation was prepared with Dragon dictation along with smaller phrase technology. Any transcriptional errors that result from this process are unintentional.

## 2017-07-18 NOTE — Care Management Note (Signed)
Case Management Note  Patient Details  Name: [name] MRN: [id number] Date of Birth: Apr 21, 1961  Subjective/Objective:                  RNCM met with patient and his mother in ICU. RNCM referral for medication assistance however when I met with the patient he denied.  He said he can afford all medications at home including Eliquis and insulin.  He states when he came into the hospital he was not ordered his pain medication which he states has been resolved. He and his girlfriend live today. He uses a cane for ambulation. His vent is provided out of [facility]. It is portable and he can travel with it.  Action/Plan:  He denies RNCM needs.  Expected Discharge Date:                  Expected Discharge Plan:     In-House Referral:     Discharge planning Services  CM Consult  Post Acute Care Choice:    Choice offered to:     DME Arranged:    DME Agency:     HH Arranged:    [place] Agency:     Status of Service:  Completed, signed off  If discussed at [employer] of Stay Meetings, dates discussed:    Additional Comments:  Marshell Garfinkel, RN 07/18/2017, 2:49 PM

## 2017-07-18 NOTE — Plan of Care (Signed)
Explain to patient about the difference anxiety relief and pain relief.  Patient has bipap applied at bedtime.

## 2017-07-18 NOTE — Progress Notes (Signed)
Pt. Off ventilator and on room air sat 94,rr 18,no resp. Distress at this time.

## 2017-07-18 NOTE — Progress Notes (Signed)
Spoke with Dr.Willis about safety measures related to patient's trach and bipap application. Orders placed to be transferred to stepdown.  ICU nurse practitioner assessed patient's current conditions and safety needs.

## 2017-07-18 NOTE — Consult Note (Signed)
Name: [name] MRN: [id number] DOB: 09/28/1960    ADMISSION DATE:  07/17/2017 CONSULTATION DATE: 07/18/2017  REFERRING MD : Dr. Anne Hahn   CHIEF COMPLAINT: Chest Pain and Weakness   BRIEF PATIENT DESCRIPTION:  57 yo male with chronic tracheostomy admitted to telemetry unit with acute on chronic hyponatremia secondary to diuretic use and ETOH abuse, hyperkalemia, angina, and acute on chronic respiratory failure requiring transfer to the stepdown unit for chronic nocturnal ventilation via tracheostomy  SIGNIFICANT EVENTS  01/24-Pt admitted to telemetry unit  01/25-Pt transferred to stepdown unit for ventilator support qhs   STUDIES:  None   HISTORY OF PRESENT ILLNESS:   This is a 57 yo male with a PMH of Chronic Tracheostomy (Mechanical Ventilation qhs), Renal Insufficiency, Pancreatitis, OSA, MI, HTN, Hyperlipidemia, Pulmonary Embolism, GERD, Fatty Liver Disease, Diabetic Peripheral Neuropathy, Diabetes Mellitus, Atrial Fibrillation on Eliquis, and CHF.  He presented to [facility] ER 01/24 with c/o intermittent chest pain and shortness of breath onset of symptoms 1 week prior to presentation.  Per ER notes the pt was seen by his PCP on 01/24 with c/o chest pain and received 2 nitroglycerin and 4 baby aspirins.  He was instructed to proceed to the ER by his PCP for further evaluation.  Upon arrival to the ER he was chest pain free EKG revealed no acute changes and CXR negative.  He has been receiving antibiotics for a sinus infection and upper respiratory infection that started in December.  Lab results revealed K+ 5.7 and Na 122.  He was subsequently admitted to the telemetry unit, however required transfer to the [facility] Unit 01/25 for chronic nocturnal ventilation via cuffless trach and on RA during the day   PAST MEDICAL HISTORY :   has a past medical history of CHF (congestive heart failure) (HCC), Diabetes mellitus (HCC), Diabetic peripheral neuropathy (HCC), Elevated liver enzymes,  GERD (gastroesophageal reflux disease), History of pulmonary embolus (PE), Hyperlipidemia, Hypertension, Myocardial infarct (HCC), OSA (obstructive sleep apnea), Pancreatitis, and Renal insufficiency.  has a past surgical history that includes Vasectomy; Tracheostomy (2007); and Cardiac surgery. Prior to Admission medications   Medication Sig Start Date End Date Taking? Authorizing Provider  atorvastatin (LIPITOR) 40 MG tablet Take 1 tablet (40 mg total) by mouth daily at 6 PM. 11/04/16  Yes Kasa, Wallis Bamberg, MD  clopidogrel (PLAVIX) 75 MG tablet Take 75 mg by mouth daily. Once daily   Yes [provider]  ELIQUIS 5 MG TABS tablet Take 5 mg by mouth 2 (two) times daily. 07/07/17  Yes [provider]  enalapril (VASOTEC) 10 MG tablet Take by mouth. 1 tablet twice daily   Yes [provider]  furosemide (LASIX) 80 MG tablet Take 80-160 mg by mouth.    Yes [provider]  gabapentin (NEURONTIN) 800 MG tablet Take 800 mg by mouth 2 (two) times daily. 1 tablet twice daily 01/24/14  Yes [provider]  insulin detemir (LEVEMIR) 100 unit/ml SOLN Inject 30-35 Units into the skin 2 (two) times daily.    Yes [provider]  insulin lispro (HUMALOG) 100 UNIT/ML injection Inject 15 Units into the skin 3 (three) times daily with meals.    Yes [provider]  lamoTRIgine (LAMICTAL) 25 MG tablet Take 125 mg by mouth 2 (two) times daily. 07/02/17  Yes [provider]  metFORMIN (GLUCOPHAGE-XR) 500 MG 24 hr tablet Take 1,000 mg by mouth 2 (two) times daily.  01/24/14  Yes [provider]  metoprolol (LOPRESSOR) 100 MG  tablet Take by mouth. 100 mg twice daily   Yes [provider]  omeprazole (PRILOSEC) 20 MG capsule Take 20 mg by mouth daily. Once daily   Yes [provider]  oxyCODONE-acetaminophen (PERCOCET) 10-325 MG tablet Take 1 tablet by mouth 4 (four) times daily as needed for pain.    Yes [provider]    carbamazepine (TEGRETOL) 200 MG tablet Take 2 tablets (400 mg total) by mouth daily at 10 pm. Patient not taking: Reported on 07/17/2017 11/04/16   Erin FullingKasa, Kurian, MD  torsemide (DEMADEX) 100 MG tablet Take 1 tablet (100 mg total) by mouth 2 (two) times daily. Patient not taking: Reported on 04/09/2017 11/04/16   Erin FullingKasa, Kurian, MD  venlafaxine XR (EFFEXOR-XR) 150 MG 24 hr capsule Take 1 capsule (150 mg total) by mouth daily with breakfast. Once daily Patient not taking: Reported on 04/09/2017 11/04/16   Erin FullingKasa, Kurian, MD   Allergies  Allergen Reactions  . Cephalexin     Other reaction(s): Unknown  . Hctz  [Hydrochlorothiazide]     Other reaction(s): Other (See Comments) hyponatremia  . Hydralazine   . Penicillins Hives  . Reserpine   . Zaroxolyn [Metolazone]     Other reaction(s): Unknown    FAMILY HISTORY:  family history includes Coronary artery disease in his father; Heart attack in his father and mother. SOCIAL HISTORY:  reports that  has never smoked. he has never used smokeless tobacco. He reports that he drinks about 3.6 - 4.2 oz of alcohol per week. He reports that he does not use drugs.  REVIEW OF SYSTEMS: Positives in BOLD  Constitutional: Negative for fever, chills, weight loss, malaise/fatigue and diaphoresis.  HENT: Negative for hearing loss, ear pain, nosebleeds, congestion, sore throat, neck pain, tinnitus and ear discharge.   Eyes: Negative for blurred vision, double vision, photophobia, pain, discharge and redness.  Respiratory: cough, hemoptysis, sputum production, shortness of breath, wheezing and stridor.   Cardiovascular: intermittent chest pain, palpitations, orthopnea, claudication, leg swelling and PND.  Gastrointestinal: Negative for heartburn, nausea, vomiting, abdominal pain, diarrhea, constipation, blood in stool and melena.  Genitourinary: Negative for dysuria, urgency, frequency, hematuria and flank pain.  Musculoskeletal: Negative for myalgias, back pain,  joint pain and falls.  Skin: Negative for itching and rash.  Neurological: Negative for dizziness, tingling, tremors, sensory change, speech change, focal weakness, seizures, loss of consciousness, weakness and headaches.  Endo/Heme/Allergies: Negative for environmental allergies and polydipsia. Does not bruise/bleed easily.  SUBJECTIVE:  No complaints at this time   VITAL SIGNS: Temp:  [98.2 F (36.8 C)] 98.2 F (36.8 C) (01/24 2022) Pulse Rate:  [111-121] 121 (01/24 2022) Resp:  [18-20] 18 (01/24 1901) BP: (111-132)/(69-98) 119/98 (01/24 2022) SpO2:  [97 %-100 %] 97 % (01/25 0012) Weight:  [140.3 kg (309 lb 3.2 oz)-142.9 kg (315 lb)] 140.3 kg (309 lb 3.2 oz) (01/24 2022)  PHYSICAL EXAMINATION: General: well developed, well nourished male, NAD  Neuro: alert and oriented, follows commands  HEENT: supple, no JVD Cardiovascular: irregular irregular, rate controlled, no M/R/G Lungs: clear throughout, even, non labored, 8 shiley uncuffed  Abdomen: +BS x4, soft, obese, non tender, non distended Musculoskeletal: 3+ bilateral lower extremity edema currently wrapped dressings dry and intact  Skin: bilateral lower extremities hyperpigmented with scattered ulcerations  Recent Labs  Lab 07/17/17 1635 07/17/17 2039  NA 122* 126*  K 5.7*  --   CL 83*  --   CO2 25  --   BUN 19  --   CREATININE  0.89  --   GLUCOSE 104*  --    Recent Labs  Lab 07/17/17 1635  HGB 13.1  HCT 38.1*  WBC 7.4  PLT 212   Dg Chest 2 View  Result Date: 07/17/2017 CLINICAL DATA:  Left-sided rib pain. Patient is recovering from a sinus infection. EXAM: CHEST  2 VIEW COMPARISON:  None. FINDINGS: Borderline cardiomegaly with minimal aortic atherosclerosis. Tracheostomy tube tip is centered over the upper trachea. Lungs are clear without pneumonic consolidation. No acute osseous abnormality. IMPRESSION: Borderline cardiomegaly with aortic atherosclerosis. No active pulmonary disease. Electronically Signed   By:  Tollie Eth M.D.   On: 07/17/2017 17:21    ASSESSMENT / PLAN: Chronic Tracheostomy Acute on chronic hyponatremia secondary to diuretic use and ETOH abuse  Hyperkalemia  Chronic Atrial Fibrillation  Angina  Hx: OSA, CHF, Diabetes Mellitus, HTN, Obesity, Hyperlipidemia  P: Chronic nocturnal ventilation via cuffless tracheostomy Prn CXR  Continuous telemetry monitoring Trend troponin's  Cardiology, Nephrology, Diabetic Coordinator, and Wound Care consulted appreciate input Hold outpatient lasix for now continue all other outpatient cardiac medications  Eliquis for VTE prophylaxis dosing per pharmacy  Trend CBC Monitor for s/sx of bleeding Transfuse for hgb <7 Serial Na levels Trend BMP Replace electrolytes as indicated  Monitor UOP  SSI CIWA protocol  Continue folic acid, mvi, and thiamine  Sonda Rumble, AGNP  Pulmonary/Critical Care Pager [phone] (please enter 7 digits) PCCM Consult Pager [phone] (please enter 7 digits)

## 2017-07-18 NOTE — Progress Notes (Signed)
   07/18/17 1425  Clinical Encounter Type  Visited With Patient and family together  Visit Type Initial;Other (Comment) (advanced directive order request)   Patient declined advanced directive education.

## 2017-07-18 NOTE — Consult Note (Signed)
WOC Nurse wound consult note Reason for Consult:CHF exacerbation .  Chronic nonhealing wounds to bilateral lower legs.  Wife treats daily with antibiotic ointment.  Surrounding tissue with erythema and tenderness.  WBC 5.1 this AM. Patient states he cannot tolerate compression.  Will try light compression.  Would like to   Get referall to wound center to evaluate for Juxtalite removable compression.  Wound type:Chronic venous insufficiency Pressure Injury POA: NA Measurement:Left anterior lower leg:  2 cm x 3 cm  Area with scattered 0.2 cm nonintact lesions  100% slough Right anterior lower leg:  2 cmx  1 cm scattered 0.2 cm  nonintact lesions 100% slough Wound ZOX:WRUEAVbed:slough Drainage (amount, consistency, odor) minimal serosanguinous  Periwound:edema, erythema Dressing procedure/placement/frequency:Cleanse bilateral lower leg with soap and water.  Apply Santyl to wound bed. COver with 4x4 gauze and ,kerlix.  Wrap from below toes to below knee with kerlix and secure with ace wrap for modified light compression.  Change dressing daily and re-apply kerlix and ace.   Will not follow at this time.  Please re-consult if needed.  Maple HudsonKaren Cristyn Crossno RN BSN CWON Pager [phone]

## 2017-07-19 LAB — BASIC METABOLIC PANEL
Anion gap: 9 (ref 5–15)
BUN: 17 mg/dL (ref 6–20)
CHLORIDE: 94 mmol/L — AB (ref 101–111)
CO2: 30 mmol/L (ref 22–32)
CREATININE: 0.85 mg/dL (ref 0.61–1.24)
Calcium: 9.3 mg/dL (ref 8.9–10.3)
GFR calc Af Amer: >60 mL/min (ref >60)
GFR calc non Af Amer: >60 mL/min (ref >60)
GLUCOSE: 180 mg/dL — AB (ref 65–99)
Potassium: 3.6 mmol/L (ref 3.5–5.1)
SODIUM: 133 mmol/L — AB (ref 135–145)

## 2017-07-19 LAB — GLUCOSE, CAPILLARY
GLUCOSE-CAPILLARY: 164 mg/dL — AB (ref 65–99)
Glucose-Capillary: 131 mg/dL — ABNORMAL HIGH (ref 65–99)
Glucose-Capillary: 102 mg/dL — ABNORMAL HIGH (ref 65–99)
Glucose-Capillary: 103 mg/dL — ABNORMAL HIGH (ref 65–99)

## 2017-07-19 LAB — SODIUM
SODIUM: 131 mmol/L — AB (ref 135–145)
Sodium: 134 mmol/L — ABNORMAL LOW (ref 135–145)
Sodium: 132 mmol/L — ABNORMAL LOW (ref 135–145)

## 2017-07-19 LAB — PHOSPHORUS: Phosphorus: 2.3 mg/dL — ABNORMAL LOW (ref 2.5–4.6)

## 2017-07-19 LAB — MAGNESIUM: MAGNESIUM: 1.5 mg/dL — AB (ref 1.7–2.4)

## 2017-07-19 MED ORDER — MAGNESIUM SULFATE 2 GM/50ML IV SOLN
2.0000 g | Freq: Once | INTRAVENOUS | Status: AC
  Administered 2017-07-19: 2 g via INTRAVENOUS
  Filled 2017-07-19: qty 50

## 2017-07-19 MED ORDER — FUROSEMIDE 10 MG/ML IJ SOLN
40.0000 mg | Freq: Once | INTRAMUSCULAR | Status: AC
  Administered 2017-07-19: 40 mg via INTRAVENOUS
  Filled 2017-07-19: qty 4

## 2017-07-19 NOTE — Progress Notes (Signed)
[employer] [facility]   PATIENT NAME: [name]    MR#:  [id number]  DATE OF BIRTH:  1960-08-14  SUBJECTIVE:  CHIEF COMPLAINT:   Chief Complaint  Patient presents with  . Chest Pain  Patient with chronic trachea, needs ventilator  REVIEW OF SYSTEMS:  Review of Systems  Constitutional: Negative for chills, fever and weight loss.  HENT: Negative for nosebleeds and sore throat.   Eyes: Negative for blurred vision.  Respiratory: Negative for cough, shortness of breath and wheezing.   Cardiovascular: Negative for chest pain, orthopnea, leg swelling and PND.  Gastrointestinal: Negative for abdominal pain, constipation, diarrhea, heartburn, nausea and vomiting.  Genitourinary: Negative for dysuria and urgency.  Musculoskeletal: Negative for back pain.  Skin: Negative for rash.  Neurological: Negative for dizziness, speech change, focal weakness and headaches.  Endo/Heme/Allergies: Does not bruise/bleed easily.  Psychiatric/Behavioral: Negative for depression.    DRUG ALLERGIES:   Allergies  Allergen Reactions  . Cephalexin     Other reaction(s): Unknown  . Hctz  [Hydrochlorothiazide]     Other reaction(s): Other (See Comments) hyponatremia  . Hydralazine   . Penicillins Hives  . Reserpine   . Zaroxolyn [Metolazone]     Other reaction(s): Unknown   VITALS:  Blood pressure 135/83, pulse (!) 111, temperature (!) 97.5 F (36.4 C), temperature source Axillary, resp. rate (!) 29, height 6\' 2"  (1.88 m), weight (!) 309 lb 4.9 oz (140.3 kg), SpO2 (!) 89 %. PHYSICAL EXAMINATION:  Physical Exam  Constitutional: He is oriented to person, place, and time and well-developed, well-nourished, and in no distress.  HENT:  Head: Normocephalic and atraumatic.  Eyes: Conjunctivae and EOM are normal. Pupils are equal, round, and reactive to light.  Neck: Normal range of motion. Neck supple. No tracheal deviation present. No thyromegaly present.    Cardiovascular: Normal rate, regular rhythm and normal heart sounds.  Pulmonary/Chest: Effort normal and breath sounds normal. No respiratory distress. He has no wheezes. He exhibits no tenderness.  Abdominal: Soft. Bowel sounds are normal. He exhibits no distension. There is no tenderness.  Musculoskeletal: Normal range of motion. He exhibits edema.  Neurological: He is alert and oriented to person, place, and time. No cranial nerve deficit.  Skin: Skin is warm and dry. No rash noted.  Legs wrapped. dressings dry and intact  Skin: bilateral lower extremities hyperpigmented with scattered ulcerations    Psychiatric: Mood and affect normal.   LABORATORY PANEL:  Male CBC Recent Labs  Lab 07/18/17 0840  WBC 5.1  HGB 12.4*  HCT 36.9*  PLT 195   ------------------------------------------------------------------------------------------------------------------ Chemistries  Recent Labs  Lab 07/19/17 0213  07/19/17 1421  NA 133*   < > 132*  K 3.6  --   --   CL 94*  --   --   CO2 30  --   --   GLUCOSE 180*  --   --   BUN 17  --   --   CREATININE 0.85  --   --   CALCIUM 9.3  --   --   MG 1.5*  --   --    < > = values in this interval not displayed.   RADIOLOGY:  No results found. ASSESSMENT AND PLAN:  57 year old male with chronic diastolic heart failure, diabetes and PAF on anticoagulation who presented to PCPs office with chest pain and generalized weakness.  1. Severe acute on chronic hyponatremia due to diuretics/ETOH abuse:  Sodium currently stable continue  to monitor closely  2. Hyperkalemia: resolved s/p Kayexalate, insulin and dextrose  3. Chest pain with history of CAD: - appreciate cardio input - Continue Plavix, atorvastatin, metoprolol  4. PAF: Continue anticoagulation with Eliquis He reports that he has been off of his anticoagulation and was taking Plavix recently because [facility] ran out of Eliquis.   5. Diabetes:  - Levemir 15 units daily. Diabetes  nurse following  6. ETOH Abuse: continue CIWA protocol  7. OSA/obseity hypoventilation with chronic trach: CPAP at night  8. Chronic lower extremity edema/lymphedema with chronic venous stasis changes: Wound care consult  9. Chronic nocturnal ventilation via cuffless tracheostomy     All the records are reviewed and case discussed with Care Management/Social Worker. Management plans discussed with the patient, family and they are in agreement.  CODE STATUS: Full Code  TOTAL TIME TAKING CARE OF THIS PATIENT: 15 minutes.   More than 50% of the time was spent in counseling/coordination of care: Carleene MainsYES    Doll Frazee M.D on 07/19/2017 at 3:29 PM  Between 7am to 6pm - Pager - [phone]  After 6pm go to [email or web address] - Social research officer, governmentpassword [email or web address] [email or web address]  [facility]  Office  [phone]  CC: Primary care physician; Hillery AldoPatel, Sarah, MD  Note: This dictation was prepared with Dragon dictation along with smaller phrase technology. Any transcriptional errors that result from this process are unintentional.

## 2017-07-19 NOTE — Progress Notes (Signed)
Assumed care of patient at this time.  Received report from previous nurse.  Patient able to make needs known.   C/o tingling, pins and needles in fingers and toes.  PRN pain med given. CIWA completed. Will continue to monitor.

## 2017-07-19 NOTE — Progress Notes (Signed)
Patient ID: [name], male   DOB: 12/09/1960, 57 y.o.   MRN: [id number]  [facility]    SYNOPSIS   57 yo male with chronic tracheostomy admitted to telemetry unit with acute on chronic hyponatremia secondary to diuretic use and ETOH abuse, hyperkalemia, angina, and acute on chronic respiratory failure requiring transfer to the stepdown unit for chronic nocturnal ventilation via tracheostomy    ASSESSMENT/PLAN    Lower extremity cellulitis. Some subtle improvement. Presently on doxycycline. We'll continue for 10-14 days.  Hyperkalemia resolved  Hyponatremia significantly improved  Hypomagnesemia. We'll replace  Hypophosphatemia. We'll give K-Phos  OSA/OHS with chronic respiratory failure. Tracheostomy in place, nocturnal ventilation with daytime trach. He has a 8 cuff less Shiley in place  Critical care time 35 minutes   VENTILATOR SETTINGS: Vent Mode: PSV FiO2 (%):  [40 %] 40 % PEEP:  [5 cmH20] 5 cmH20 Pressure Support:  [10 cmH20] 10 cmH20 INTAKE / OUTPUT:  Intake/Output Summary (Last 24 hours) at 07/19/2017 0836 Last data filed at 07/19/2017 0630 Gross per 24 hour  Intake 480 ml  Output 2955 ml  Net -2475 ml   Name: [name] MRN: [id number] DOB: 12/09/1960    ADMISSION DATE:  07/17/2017  SUBJECTIVE:   VITAL SIGNS: Temp:  [98.2 F (36.8 C)-98.4 F (36.9 C)] 98.2 F (36.8 C) (01/26 0200) Pulse Rate:  [62-126] 64 (01/26 0400) Resp:  [12-22] 13 (01/26 0400) BP: (101-146)/(63-93) 111/76 (01/26 0400) SpO2:  [89 %-100 %] 97 % (01/26 0426) FiO2 (%):  [40 %] 40 % (01/26 0426)  PHYSICAL EXAMINATION: Physical Examination:   VS: BP 111/76   Pulse 64   Temp 98.2 F (36.8 C) (Oral)   Resp 13   Ht 6\' 2"  (1.88 m)   Wt (!) 309 lb 4.9 oz (140.3 kg)   SpO2 97%   BMI 39.71 kg/m   General Appearance: No distress  Neuro:without focal findings, mental status normal. HEENT: PERRLA, EOM intact. Pulmonary: normal  breath sounds   CardiovascularNormal S1,S2.  No m/r/g.   Abdomen: Benign, Soft, non-tender. Skin:   Bilateral lower extremity cellulitis, significant erythema noted on both legs, may be some subtle improvement from yesterday Extremities: normal, no cyanosis, clubbing.  LABORATORY PANEL:   CBC Recent Labs  Lab 07/18/17 0840  WBC 5.1  HGB 12.4*  HCT 36.9*  PLT 195    Chemistries  Recent Labs  Lab 07/19/17 0213  NA 133*  K 3.6  CL 94*  CO2 30  GLUCOSE 180*  BUN 17  CREATININE 0.85  CALCIUM 9.3  MG 1.5*  PHOS 2.3*    Recent Labs  Lab 07/18/17 0222 07/18/17 0813 07/18/17 1150 07/18/17 1655 07/18/17 2125 07/19/17 0754  GLUCAP 117* 131* 188* 139* 128* 131*   No results for input(s): PHART, PCO2ART, PO2ART in the last 168 hours. No results for input(s): AST, ALT, ALKPHOS, BILITOT, ALBUMIN in the last 168 hours.  Cardiac Enzymes Recent Labs  Lab 07/18/17 0840  TROPONINI <0.03    RADIOLOGY:  Dg Chest 2 View  Result Date: 07/17/2017 CLINICAL DATA:  Left-sided rib pain. Patient is recovering from a sinus infection. EXAM: CHEST  2 VIEW COMPARISON:  None. FINDINGS: Borderline cardiomegaly with minimal aortic atherosclerosis. Tracheostomy tube tip is centered over the upper trachea. Lungs are clear without pneumonic consolidation. No acute osseous abnormality. IMPRESSION: Borderline cardiomegaly with aortic atherosclerosis. No active pulmonary disease. Electronically Signed   By: Tollie Ethavid  Kwon M.D.   On: 07/17/2017  17:21   Tora Kindred, DO  07/19/2017

## 2017-07-19 NOTE — Progress Notes (Signed)
Patient has been sitting up in bedside recliner chair since this morning. No complaint of chest pain during shift.  Patient performs his own trach care per his request.  Tolerating diet with good appetite.  Visitor at bedside.

## 2017-07-19 NOTE — Progress Notes (Signed)
ANTICOAGULATION CONSULT NOTE - Initial Consult  Pharmacy Consult for Eliquis Indication: atrial fibrillation, CHADS2VASc at least 3  Allergies  Allergen Reactions  . Cephalexin     Other reaction(s): Unknown  . Hctz  [Hydrochlorothiazide]     Other reaction(s): Other (See Comments) hyponatremia  . Hydralazine   . Penicillins Hives  . Reserpine   . Zaroxolyn [Metolazone]     Other reaction(s): Unknown    Patient Measurements: Height: 6\' 2"  (188 cm) Weight: (!) 309 lb 4.9 oz (140.3 kg) IBW/kg (Calculated) : 82.2   Vital Signs: Temp: 97.5 F (36.4 C) (01/26 0900) Temp Source: Axillary (01/26 0900) BP: 135/83 (01/26 1500) Pulse Rate: 74 (01/26 1538)  Labs: Recent Labs    07/17/17 1635 07/17/17 2039 07/18/17 0230 07/18/17 0840 07/19/17 0213  HGB 13.1  --   --  12.4*  --   HCT 38.1*  --   --  36.9*  --   PLT 212  --   --  195  --   CREATININE 0.89  --   --  0.86  0.83 0.85  TROPONINI <0.03 <0.03 <0.03 <0.03  --     Estimated Creatinine Clearance: 146.4 mL/min (by C-G formula based on SCr of 0.85 mg/dL).   Medical History: Past Medical History:  Diagnosis Date  . CHF (congestive heart failure) (HCC)   . Diabetes mellitus (HCC)   . Diabetic peripheral neuropathy (HCC)   . Elevated liver enzymes    fatty liver per [facility]  . GERD (gastroesophageal reflux disease)   . History of pulmonary embolus (PE)   . Hyperlipidemia   . Hypertension   . Myocardial infarct (HCC)   . OSA (obstructive sleep apnea)   . [facility]    [facility] with ARDS  . Renal insufficiency     Medications:  Infusions:    Assessment: 56 yom cc CP with PMH CHF, DM, HTN, hyponatremia, MI and PE on DAPT. ED ECG interpreted as atrial flutter d/t hyponatremia (similar presentation to last admission). Pharmacy consulted to dose Eliquis for atrial fibrillation.   Plan:  Continue Eliquis 5 mg po BID.   Marty HeckWang, Savalas Monje L, Pharm.D., BCPS Clinical  Pharmacist 07/19/2017,4:50 PM

## 2017-07-19 NOTE — Progress Notes (Signed)
Relinquished care of patient to previous nurse.  No c/o at this time.  Patient asleep.

## 2017-07-19 NOTE — Progress Notes (Signed)
RN made Dr. Lonn Georgiaonforti aware that patient was asking about not getting lasix.  MD discussed with RN and gave order for one time dose of 40 mg IV lasix.

## 2017-07-19 NOTE — Progress Notes (Signed)
Pt. Off ventilator,with inner cannla out as pt. Preferred.No distress noted at this time.

## 2017-07-20 LAB — BASIC METABOLIC PANEL
Anion gap: 7 (ref 5–15)
BUN: 15 mg/dL (ref 6–20)
CALCIUM: 9.3 mg/dL (ref 8.9–10.3)
CO2: 32 mmol/L (ref 22–32)
CREATININE: 0.76 mg/dL (ref 0.61–1.24)
Chloride: 93 mmol/L — ABNORMAL LOW (ref 101–111)
GFR calc Af Amer: >60 mL/min (ref >60)
GFR calc non Af Amer: >60 mL/min (ref >60)
GLUCOSE: 181 mg/dL — AB (ref 65–99)
Potassium: 3.5 mmol/L (ref 3.5–5.1)
Sodium: 132 mmol/L — ABNORMAL LOW (ref 135–145)

## 2017-07-20 LAB — GLUCOSE, CAPILLARY: Glucose-Capillary: 155 mg/dL — ABNORMAL HIGH (ref 65–99)

## 2017-07-20 LAB — PHOSPHORUS: Phosphorus: 3.1 mg/dL (ref 2.5–4.6)

## 2017-07-20 LAB — MAGNESIUM: MAGNESIUM: 1.7 mg/dL (ref 1.7–2.4)

## 2017-07-20 MED ORDER — FUROSEMIDE 80 MG PO TABS: 80.0000 mg | ORAL_TABLET | Freq: Every day | ORAL | Status: DC

## 2017-07-20 MED ORDER — DOXYCYCLINE HYCLATE 100 MG PO TABS: 100.0000 mg | ORAL_TABLET | Freq: Two times a day (BID) | ORAL | 0 refills | Status: AC

## 2017-07-20 MED ORDER — COLLAGENASE 250 UNIT/GM EX OINT: TOPICAL_OINTMENT | Freq: Every day | CUTANEOUS | 0 refills | Status: AC

## 2017-07-20 MED ORDER — ELIQUIS 5 MG PO TABS: 5.0000 mg | ORAL_TABLET | Freq: Two times a day (BID) | ORAL | 0 refills | Status: DC

## 2017-07-20 NOTE — Progress Notes (Signed)
Patient remains alert and oriented. Wife at bedside with patient. Patient insists upon doing his own trach care in his own way. Refuses assistance. Complaints of pain and anxiety have been treated with PRN medications which seemed to help patient.

## 2017-07-20 NOTE — Progress Notes (Signed)
Pt being discharged home. Pt given morning meds and gathered belongings with the help of his wife. Pt escorted to vehicle and discharged without incident.

## 2017-07-20 NOTE — Discharge Summary (Signed)
[facility] -  at [facility], 57 y.o., DOB [birth year], MRN [id number]. Admission date: 07/17/2017 Discharge Date 07/20/2017 Primary MD Hillery Aldo, MD Admitting Physician Adrian Saran, MD  Admission Diagnosis  Hyponatremia [E87.1] Atrial flutter, unspecified type [facility]) [I48.92] Chest pain, unspecified type [R07.9]  Discharge Diagnosis   Active Problems: Severe acute on chronic hyponatremia due to diuresis and alcohol abuse Hyperkalemia Chest pain with history of coronary artery disease PAF Diabetes type 2 Alcohol abuse Obstructive sleep apnea/obesity hypoventilation syndrome Chronic lower extremity edema Chronic respiratory failure with trach      Hospital Course  57 year old male with chronic diastolic heart failure, diabetes and PAF on anticoagulation who presented to PCPs office with chest pain and generalized weakness.  Patient was sent to the emergency room and was noted to have hyponatremia which was severe.  He was admitted and thought to have hyponatremia due to alcohol abuse and diuretic therapy.  Patient diuretics were held and he was given IV fluids with these treatments his sodium started to improve and is much better.  Patient is feeling better sodium is improved.                Consults  None  Significant Tests:  See full reports for all details    Dg Chest 2 View  Result Date: 07/17/2017 CLINICAL DATA:  Left-sided rib pain. Patient is recovering from a sinus infection. EXAM: CHEST  2 VIEW COMPARISON:  None. FINDINGS: Borderline cardiomegaly with minimal aortic atherosclerosis. Tracheostomy tube tip is centered over the upper trachea. Lungs are clear without pneumonic consolidation. No acute osseous abnormality. IMPRESSION: Borderline cardiomegaly with aortic atherosclerosis. No active pulmonary disease. Electronically Signed   By: Tollie Eth M.D.   On: 07/17/2017 17:21       Today   Subjective:   [name]  feeling much better wants to go home  Objective:   Blood pressure 132/86, pulse 85, temperature 98.2 F (36.8 C), temperature source Oral, resp. rate 14, height 6\' 2"  (1.88 m), weight (!) 309 lb 4.9 oz (140.3 kg), SpO2 95 %.  .  Intake/Output Summary (Last 24 hours) at 07/20/2017 1518 Last data filed at 07/19/2017 2030 Gross per 24 hour  Intake 360 ml  Output 625 ml  Net -265 ml    Exam VITAL SIGNS: Blood pressure 132/86, pulse 85, temperature 98.2 F (36.8 C), temperature source Oral, resp. rate 14, height 6\' 2"  (1.88 m), weight (!) 309 lb 4.9 oz (140.3 kg), SpO2 95 %.  GENERAL:  57 y.o.-year-old patient lying in the bed with no acute distress.  EYES: Pupils equal, round, reactive to light and accommodation. No scleral icterus. Extraocular muscles intact.  HEENT: Head atraumatic, normocephalic. Oropharynx and nasopharynx clear.  NECK:  Supple, no jugular venous distention. No thyroid enlargement, no tenderness.  LUNGS: Normal breath sounds bilaterally, no wheezing, rales,rhonchi or crepitation. No use of accessory muscles of respiration.  CARDIOVASCULAR: S1, S2 normal. No murmurs, rubs, or gallops.  ABDOMEN: Soft, nontender, nondistended. Bowel sounds present. No organomegaly or mass.  EXTREMITIES: Positive pedal edema, cyanosis, or clubbing.  NEUROLOGIC: Cranial nerves II through XII are intact. Muscle strength 5/5 in all extremities. Sensation intact. Gait not checked.  PSYCHIATRIC: [name].  SKIN: No obvious rash, lesion, or ulcer.   Data Review     CBC w Diff:  Lab Results  Component Value Date   WBC 5.1 07/18/2017   HGB 12.4 (L) 07/18/2017  HGB 12.8 (L) 07/29/2014   HCT 36.9 (L) 07/18/2017   HCT 38.3 (L) 07/29/2014   PLT 195 07/18/2017   PLT 234 07/29/2014   LYMPHOPCT 22 11/02/2016   LYMPHOPCT 29.1 04/03/2012   MONOPCT 8 11/02/2016   MONOPCT 8.8 04/03/2012   EOSPCT 2 11/02/2016   EOSPCT 3.1 04/03/2012   BASOPCT 1 11/02/2016    BASOPCT 1.0 04/03/2012   CMP:  Lab Results  Component Value Date   NA 132 (L) 07/20/2017   NA 130 (L) 07/29/2014   K 3.5 07/20/2017   K 4.4 07/29/2014   CL 93 (L) 07/20/2017   CL 93 (L) 07/29/2014   CO2 32 07/20/2017   CO2 27 07/29/2014   BUN 15 07/20/2017   BUN 27 (H) 07/29/2014   CREATININE 0.76 07/20/2017   CREATININE 1.36 (H) 07/29/2014   PROT 7.0 11/03/2016   PROT 7.3 08/14/2013   ALBUMIN 3.9 11/03/2016   ALBUMIN 3.6 08/14/2013   BILITOT 1.0 11/03/2016   BILITOT 0.3 08/14/2013   ALKPHOS 87 11/03/2016   ALKPHOS 130 (H) 08/14/2013   AST 32 11/03/2016   AST 26 08/14/2013   ALT 22 11/03/2016   ALT 34 08/14/2013  .  Micro Results Recent Results (from the past 240 hour(s))  MRSA PCR Screening     Status: None   Collection Time: 07/17/17  8:42 PM  Result Value Ref Range Status   MRSA by PCR NEGATIVE NEGATIVE Final    Comment:        The GeneXpert MRSA Assay (FDA approved for NASAL specimens only), is one component of a comprehensive MRSA colonization surveillance program. It is not intended to diagnose MRSA infection nor to guide or monitor treatment for MRSA infections. Performed at [facility], [address] [place], [place] [address]      Code Status History    Date Active Date Inactive Code Status Order ID Comments User Context   07/17/2017 20:20 07/20/2017 13:25 Full Code [id number]  Adrian SaranMody, Sital, MD Inpatient   11/02/2016 20:06 11/04/2016 18:08 Full Code [id number]  Marguarite ArbourSparks, Jeffrey D, MD Inpatient          Follow-up Information    [facility] Follow up in 1 week(s).   Specialty:  Wound Care Why:  lower ext wounds Contact information: [address] [id number] ar [phone]       Hillery AldoPatel, Sarah, MD Follow up in 1 week(s).   Specialty:  Family Medicine Why:  hospital f/u Contact information: [address] [place] [address] [phone]        Mady HaagensenLateef, Munsoor, MD  Follow up in 2 week(s).   Specialty:  Internal Medicine Why:  hyponatremia Contact information: [address] [place] [id number] [phone]           Discharge Medications   Allergies as of 07/20/2017      Reactions   Cephalexin    Other reaction(s): Unknown   Hctz  [hydrochlorothiazide]    Other reaction(s): Other (See Comments) hyponatremia   Hydralazine    Penicillins Hives   Reserpine    Zaroxolyn [metolazone]    Other reaction(s): Unknown      Medication List    STOP taking these medications   clopidogrel 75 MG tablet Commonly known as:  PLAVIX   enalapril 10 MG tablet Commonly known as:  VASOTEC   torsemide 100 MG tablet Commonly known as:  DEMADEX     TAKE these medications  atorvastatin 40 MG tablet Commonly known as:  LIPITOR Take 1 tablet (40 mg total) by mouth daily at 6 PM.   carbamazepine 200 MG tablet Commonly known as:  TEGRETOL Take 2 tablets (400 mg total) by mouth daily at 10 pm.   collagenase ointment Commonly known as:  SANTYL Apply topically daily.   doxycycline 100 MG tablet Commonly known as:  VIBRA-TABS Take 1 tablet (100 mg total) by mouth every 12 (twelve) hours for 5 days.   ELIQUIS 5 MG Tabs tablet Generic drug:  apixaban Take 1 tablet (5 mg total) by mouth 2 (two) times daily.   furosemide 80 MG tablet Commonly known as:  LASIX Take 1-2 tablets (80-160 mg total) by mouth daily. What changed:  when to take this   gabapentin 800 MG tablet Commonly known as:  NEURONTIN Take 800 mg by mouth 2 (two) times daily. 1 tablet twice daily   HUMALOG 100 UNIT/ML injection Generic drug:  insulin lispro Inject 15 Units into the skin 3 (three) times daily with meals.   insulin detemir 100 unit/ml Soln Commonly known as:  LEVEMIR Inject 30-35 Units into the skin 2 (two) times daily.   lamoTRIgine 25 MG tablet Commonly known as:  LAMICTAL Take 125 mg by mouth 2 (two) times daily.   metFORMIN 500 MG 24 hr  tablet Commonly known as:  GLUCOPHAGE-XR Take 1,000 mg by mouth 2 (two) times daily.   metoprolol tartrate 100 MG tablet Commonly known as:  LOPRESSOR Take by mouth. 100 mg twice daily   omeprazole 20 MG capsule Commonly known as:  PRILOSEC Take 20 mg by mouth daily. Once daily   oxyCODONE-acetaminophen 10-325 MG tablet Commonly known as:  PERCOCET Take 1 tablet by mouth 4 (four) times daily as needed for pain.   venlafaxine XR 150 MG 24 hr capsule Commonly known as:  EFFEXOR-XR Take 1 capsule (150 mg total) by mouth daily with breakfast. Once daily          Total Time in preparing paper work, data evaluation and todays exam - 35 minutes  Auburn Bilberry M.D on 07/20/2017 at 3:18 PM  [facility]   Office  [phone]

## 2017-07-20 NOTE — Discharge Instructions (Addendum)
[facility] - [place] [facility]  DIET:  Cardiac diet,diaebetic  DISCHARGE CONDITION:  Stable  ACTIVITY:  Activity as tolerated  OXYGEN:  Home Oxygen: No.   Oxygen Delivery: continue vent as before  DISCHARGE LOCATION:  home    ADDITIONAL DISCHARGE INSTRUCTION: Apply santyl to leg wound bid   If you experience worsening of your admission symptoms, develop shortness of breath, life threatening emergency, suicidal or homicidal thoughts you must seek medical attention immediately by calling 911 or calling your MD immediately  if symptoms less severe.  You Must read complete instructions/literature along with all the possible adverse reactions/side effects for all the Medicines you take and that have been prescribed to you. Take any new Medicines after you have completely understood and accpet all the possible adverse reactions/side effects.   Please note  You were cared for by a hospitalist during your hospital stay. If you have any questions about your discharge medications or the care you received while you were in the hospital after you are discharged, you can call the unit and asked to speak with the hospitalist on call if the hospitalist that took care of you is not available. Once you are discharged, your primary care physician will handle any further medical issues. Please note that NO REFILLS for any discharge medications will be authorized once you are discharged, as it is imperative that you return to your primary care physician (or establish a relationship with a primary care physician if you do not have one) for your aftercare needs so that they can reassess your need for medications and monitor your lab values.

## 2017-07-20 NOTE — Progress Notes (Signed)
Patient ID: [name], male   DOB: [birth year], 57 y.o.   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 Patient is significantly improved on doxycycline.  Hyperkalemia resolved  Hyponatremia significantly improved  OSA/OHS with chronic respiratory failure. Tracheostomy in place, nocturnal ventilation with daytime trach. He has a 8 cuff less Shiley in place  Patient is doing very well. Has home ventilator and appropriate tubing and supplies. Only on oral doxycycline at this point. Discussed with Dr. Allena KatzPatel will discharge home. Would recommend pulmonary follow-up for chronic respiratory issues.   VENTILATOR SETTINGS: Vent Mode: PSV FiO2 (%):  [40 %] 40 % PEEP:  [5 cmH20] 5 cmH20 Pressure Support:  [10 cmH20] 10 cmH20 Plateau Pressure:  [16 cmH20] 16 cmH20 INTAKE / OUTPUT:  Intake/Output Summary (Last 24 hours) at 07/20/2017 0845 Last data filed at 07/19/2017 2030 Gross per 24 hour  Intake 600 ml  Output 1350 ml  Net -750 ml   Name: [name] MRN: [id number] DOB: [birth year]    ADMISSION DATE:  07/17/2017  SUBJECTIVE:   VITAL SIGNS: Temp:  [97.5 F (36.4 C)-98.2 F (36.8 C)] 98.2 F (36.8 C) (01/27 0800) Pulse Rate:  [61-111] 61 (01/27 0600) Resp:  [13-29] 15 (01/27 0600) BP: (113-155)/(69-111) 139/84 (01/27 0600) SpO2:  [87 %-100 %] 100 % (01/27 0600) FiO2 (%):  [40 %] 40 % (01/27 0144)  PHYSICAL EXAMINATION: Physical Examination:   VS: BP 139/84   Pulse 61   Temp 98.2 F (36.8 C)   Resp 15   Ht 6\' 2"  (1.88 m)   Wt (!) 309 lb 4.9 oz (140.3 kg)   SpO2 100%   BMI 39.71 kg/m     General Appearance: No distress  Neuro:without focal findings, mental status normal. HEENT: PERRLA, EOM intact. Pulmonary: normal breath sounds   CardiovascularNormal S1,S2.  No m/r/g.   Abdomen: Benign, Soft, non-tender. Skin:   Bilateral lower extremity cellulitis, significant erythema noted on both legs, may be some subtle improvement from yesterday Extremities: normal, no cyanosis, clubbing.  LABORATORY PANEL:   CBC Recent Labs  Lab 07/18/17 0840  WBC 5.1  HGB 12.4*  HCT 36.9*  PLT 195    Chemistries  Recent Labs  Lab 07/20/17 0156  NA 132*  K 3.5  CL 93*  CO2 32  GLUCOSE 181*  BUN 15  CREATININE 0.76  CALCIUM 9.3  MG 1.7  PHOS 3.1    Recent Labs  Lab 07/18/17 2125 07/19/17 0754 07/19/17 1200 07/19/17 1702 07/19/17 2210 07/20/17 0841  GLUCAP 128* 131* 164* 102* 103* 155*   No results for input(s): PHART, PCO2ART, PO2ART in the last 168 hours. No results for input(s): AST, ALT, ALKPHOS, BILITOT, ALBUMIN in the last 168 hours.  Cardiac Enzymes Recent Labs  Lab 07/18/17 0840  TROPONINI <0.03    RADIOLOGY:  No results found. Tora KindredJohn Taryne Kiger, DO  1/27/2019Patient ID: [name], male   DOB: [birth year], 57 y.o.   MRN: [id number]

## 2017-07-22 ENCOUNTER — Observation Stay
Admission: EM | Admit: 2017-07-22 | Discharge: 2017-07-23 | Disposition: A | Discharge status: Home / Self Care | Payer: Medicare HMO | Attending: Internal Medicine | Admitting: Internal Medicine

## 2017-07-22 DIAGNOSIS — Z955 Presence of coronary angioplasty implant and graft: Secondary | ICD-10-CM | POA: Insufficient documentation

## 2017-07-22 DIAGNOSIS — Z430 Encounter for attention to tracheostomy: Secondary | ICD-10-CM | POA: Diagnosis not present

## 2017-07-22 DIAGNOSIS — I110 Hypertensive heart disease with heart failure: Secondary | ICD-10-CM | POA: Diagnosis not present

## 2017-07-22 DIAGNOSIS — Z7901 Long term (current) use of anticoagulants: Secondary | ICD-10-CM | POA: Insufficient documentation

## 2017-07-22 DIAGNOSIS — Z9989 Dependence on other enabling machines and devices: Secondary | ICD-10-CM | POA: Diagnosis not present

## 2017-07-22 DIAGNOSIS — E785 Hyperlipidemia, unspecified: Secondary | ICD-10-CM | POA: Insufficient documentation

## 2017-07-22 DIAGNOSIS — I480 Paroxysmal atrial fibrillation: Secondary | ICD-10-CM | POA: Insufficient documentation

## 2017-07-22 DIAGNOSIS — I252 Old myocardial infarction: Secondary | ICD-10-CM | POA: Insufficient documentation

## 2017-07-22 DIAGNOSIS — E662 Morbid (severe) obesity with alveolar hypoventilation: Secondary | ICD-10-CM | POA: Insufficient documentation

## 2017-07-22 DIAGNOSIS — E1142 Type 2 diabetes mellitus with diabetic polyneuropathy: Secondary | ICD-10-CM | POA: Diagnosis not present

## 2017-07-22 DIAGNOSIS — J9611 Chronic respiratory failure with hypoxia: Secondary | ICD-10-CM | POA: Diagnosis not present

## 2017-07-22 DIAGNOSIS — J989 Respiratory disorder, unspecified: Secondary | ICD-10-CM | POA: Insufficient documentation

## 2017-07-22 DIAGNOSIS — F419 Anxiety disorder, unspecified: Secondary | ICD-10-CM | POA: Insufficient documentation

## 2017-07-22 DIAGNOSIS — Z86711 Personal history of pulmonary embolism: Secondary | ICD-10-CM | POA: Diagnosis not present

## 2017-07-22 DIAGNOSIS — E871 Hypo-osmolality and hyponatremia: Secondary | ICD-10-CM | POA: Insufficient documentation

## 2017-07-22 DIAGNOSIS — I509 Heart failure, unspecified: Secondary | ICD-10-CM | POA: Diagnosis not present

## 2017-07-22 DIAGNOSIS — Z794 Long term (current) use of insulin: Secondary | ICD-10-CM | POA: Insufficient documentation

## 2017-07-22 DIAGNOSIS — N289 Disorder of kidney and ureter, unspecified: Secondary | ICD-10-CM | POA: Diagnosis not present

## 2017-07-22 DIAGNOSIS — R079 Chest pain, unspecified: Secondary | ICD-10-CM | POA: Diagnosis present

## 2017-07-22 DIAGNOSIS — J9612 Chronic respiratory failure with hypercapnia: Secondary | ICD-10-CM | POA: Diagnosis not present

## 2017-07-22 DIAGNOSIS — K760 Fatty (change of) liver, not elsewhere classified: Secondary | ICD-10-CM | POA: Diagnosis not present

## 2017-07-22 DIAGNOSIS — G4733 Obstructive sleep apnea (adult) (pediatric): Secondary | ICD-10-CM | POA: Diagnosis not present

## 2017-07-22 DIAGNOSIS — Z79899 Other long term (current) drug therapy: Secondary | ICD-10-CM | POA: Insufficient documentation

## 2017-07-22 DIAGNOSIS — Z880 Allergy status to penicillin: Secondary | ICD-10-CM | POA: Insufficient documentation

## 2017-07-22 DIAGNOSIS — K219 Gastro-esophageal reflux disease without esophagitis: Secondary | ICD-10-CM | POA: Insufficient documentation

## 2017-07-22 DIAGNOSIS — R1011 Right upper quadrant pain: Secondary | ICD-10-CM

## 2017-07-22 DIAGNOSIS — Z881 Allergy status to other antibiotic agents status: Secondary | ICD-10-CM | POA: Insufficient documentation

## 2017-07-22 DIAGNOSIS — R0602 Shortness of breath: Secondary | ICD-10-CM

## 2017-07-22 DIAGNOSIS — Z888 Allergy status to other drugs, medicaments and biological substances status: Secondary | ICD-10-CM | POA: Insufficient documentation

## 2017-07-22 NOTE — ED Triage Notes (Signed)
Pt comes in via ems with c/o shortness of breath, heart palpatation, geneerally feeling bad all day. Pt has a trach and a dx of a fib and hypo sodium. Pt also has bilateral cellulitis and is n abx for them

## 2017-07-23 ENCOUNTER — Emergency Department: Payer: Medicare HMO

## 2017-07-23 ENCOUNTER — Other Ambulatory Visit: Payer: Self-pay

## 2017-07-23 ENCOUNTER — Observation Stay: Payer: Medicare HMO

## 2017-07-23 DIAGNOSIS — R1011 Right upper quadrant pain: Secondary | ICD-10-CM

## 2017-07-23 DIAGNOSIS — J9621 Acute and chronic respiratory failure with hypoxia: Secondary | ICD-10-CM

## 2017-07-23 LAB — HEPATIC FUNCTION PANEL
ALT: 18 U/L (ref 17–63)
AST: 26 U/L (ref 15–41)
Albumin: 3.8 g/dL (ref 3.5–5.0)
Alkaline Phosphatase: 88 U/L (ref 38–126)
Bilirubin, Direct: 0.1 mg/dL (ref 0.1–0.5)
Indirect Bilirubin: 0.5 mg/dL (ref 0.3–0.9)
TOTAL PROTEIN: 7 g/dL (ref 6.5–8.1)
Total Bilirubin: 0.6 mg/dL (ref 0.3–1.2)

## 2017-07-23 LAB — CBC
HEMATOCRIT: 38.7 % — AB (ref 40.0–52.0)
Hemoglobin: 13.3 g/dL (ref 13.0–18.0)
MCH: 31.9 pg (ref 26.0–34.0)
MCHC: 34.2 g/dL (ref 32.0–36.0)
MCV: 93.2 fL (ref 80.0–100.0)
Platelets: 241 10*3/uL (ref 150–440)
RBC: 4.16 MIL/uL — ABNORMAL LOW (ref 4.40–5.90)
RDW: 13.4 % (ref 11.5–14.5)
WBC: 6.6 10*3/uL (ref 3.8–10.6)

## 2017-07-23 LAB — TROPONIN I

## 2017-07-23 LAB — BASIC METABOLIC PANEL
ANION GAP: 9 (ref 5–15)
BUN: 15 mg/dL (ref 6–20)
CO2: 29 mmol/L (ref 22–32)
CREATININE: 0.7 mg/dL (ref 0.61–1.24)
Calcium: 9.6 mg/dL (ref 8.9–10.3)
Chloride: 94 mmol/L — ABNORMAL LOW (ref 101–111)
GFR calc non Af Amer: >60 mL/min (ref >60)
Glucose, Bld: 166 mg/dL — ABNORMAL HIGH (ref 65–99)
Potassium: 4.5 mmol/L (ref 3.5–5.1)
SODIUM: 132 mmol/L — AB (ref 135–145)

## 2017-07-23 MED ORDER — DOCUSATE SODIUM 100 MG PO CAPS: 100.0000 mg | ORAL_CAPSULE | Freq: Two times a day (BID) | ORAL | Status: DC

## 2017-07-23 MED ORDER — ACETAMINOPHEN 325 MG PO TABS: 650.0000 mg | ORAL_TABLET | Freq: Four times a day (QID) | ORAL | Status: DC | PRN

## 2017-07-23 MED ORDER — MORPHINE SULFATE (PF) 4 MG/ML IV SOLN
4.0000 mg | Freq: Once | INTRAVENOUS | Status: AC
  Administered 2017-07-23: 4 mg via INTRAVENOUS

## 2017-07-23 MED ORDER — ALPRAZOLAM 0.5 MG PO TABS: 0.5000 mg | ORAL_TABLET | Freq: Two times a day (BID) | ORAL | 0 refills | Status: AC | PRN

## 2017-07-23 MED ORDER — ACETAMINOPHEN 650 MG RE SUPP: 650.0000 mg | Freq: Four times a day (QID) | RECTAL | Status: DC | PRN

## 2017-07-23 MED ORDER — MORPHINE SULFATE (PF) 2 MG/ML IV SOLN
2.0000 mg | INTRAVENOUS | Status: DC | PRN
  Administered 2017-07-23: 2 mg via INTRAVENOUS
  Filled 2017-07-23: qty 1

## 2017-07-23 MED ORDER — COLLAGENASE 250 UNIT/GM EX OINT: TOPICAL_OINTMENT | Freq: Every day | CUTANEOUS | Status: DC

## 2017-07-23 MED ORDER — MORPHINE SULFATE (PF) 4 MG/ML IV SOLN
INTRAVENOUS | Status: AC
  Administered 2017-07-23: 4 mg via INTRAVENOUS
  Filled 2017-07-23: qty 1

## 2017-07-23 MED ORDER — FUROSEMIDE 40 MG PO TABS: 80.0000 mg | ORAL_TABLET | Freq: Every day | ORAL | Status: DC

## 2017-07-23 MED ORDER — IOPAMIDOL (ISOVUE-370) INJECTION 76%
100.0000 mL | Freq: Once | INTRAVENOUS | Status: AC | PRN
  Administered 2017-07-23: 100 mL via INTRAVENOUS

## 2017-07-23 MED ORDER — ONDANSETRON HCL 4 MG/2ML IJ SOLN
4.0000 mg | Freq: Once | INTRAMUSCULAR | Status: AC
  Administered 2017-07-23: 4 mg via INTRAVENOUS

## 2017-07-23 MED ORDER — APIXABAN 5 MG PO TABS
5.0000 mg | ORAL_TABLET | Freq: Two times a day (BID) | ORAL | Status: DC
  Administered 2017-07-23: 5 mg via ORAL
  Filled 2017-07-23: qty 1

## 2017-07-23 MED ORDER — LORAZEPAM 1 MG PO TABS
1.0000 mg | ORAL_TABLET | Freq: Four times a day (QID) | ORAL | Status: DC | PRN
Start: 2017-07-23 — End: 2017-07-23
  Administered 2017-07-23: 1 mg via ORAL
  Filled 2017-07-23: qty 1

## 2017-07-23 MED ORDER — LAMOTRIGINE 25 MG PO TABS: 125.0000 mg | ORAL_TABLET | Freq: Two times a day (BID) | ORAL | Status: DC

## 2017-07-23 MED ORDER — ASPIRIN 81 MG PO CHEW
324.0000 mg | CHEWABLE_TABLET | Freq: Once | ORAL | Status: AC
  Administered 2017-07-23: 324 mg via ORAL

## 2017-07-23 MED ORDER — ALPRAZOLAM 0.5 MG PO TABS: 0.5000 mg | ORAL_TABLET | Freq: Three times a day (TID) | ORAL | 0 refills | Status: AC | PRN

## 2017-07-23 MED ORDER — NITROGLYCERIN 2 % TD OINT
0.5000 [in_us] | TOPICAL_OINTMENT | TRANSDERMAL | Status: AC
  Administered 2017-07-23: 0.5 [in_us] via TOPICAL
  Filled 2017-07-23: qty 1

## 2017-07-23 MED ORDER — METOPROLOL TARTRATE 50 MG PO TABS
100.0000 mg | ORAL_TABLET | Freq: Two times a day (BID) | ORAL | Status: DC
  Administered 2017-07-23: 100 mg via ORAL
  Filled 2017-07-23: qty 2

## 2017-07-23 MED ORDER — ONDANSETRON HCL 4 MG PO TABS: 4.0000 mg | ORAL_TABLET | Freq: Four times a day (QID) | ORAL | Status: DC | PRN

## 2017-07-23 MED ORDER — GABAPENTIN 400 MG PO CAPS
800.0000 mg | ORAL_CAPSULE | Freq: Two times a day (BID) | ORAL | Status: DC
  Filled 2017-07-23 (×2): qty 2

## 2017-07-23 MED ORDER — INSULIN GLARGINE 100 UNIT/ML ~~LOC~~ SOLN: 20.0000 [IU] | Freq: Every day | SUBCUTANEOUS | Status: DC

## 2017-07-23 MED ORDER — ONDANSETRON HCL 4 MG/2ML IJ SOLN: 4.0000 mg | Freq: Four times a day (QID) | INTRAMUSCULAR | Status: DC | PRN

## 2017-07-23 MED ORDER — INSULIN GLARGINE 100 UNIT/ML ~~LOC~~ SOLN
15.0000 [IU] | Freq: Every day | SUBCUTANEOUS | Status: DC
  Filled 2017-07-23: qty 0.15

## 2017-07-23 MED ORDER — ASPIRIN 81 MG PO CHEW
CHEWABLE_TABLET | ORAL | Status: AC
  Administered 2017-07-23: 324 mg via ORAL
  Filled 2017-07-23: qty 4

## 2017-07-23 MED ORDER — DOXYCYCLINE HYCLATE 100 MG PO TABS: 100.0000 mg | ORAL_TABLET | Freq: Two times a day (BID) | ORAL | Status: DC

## 2017-07-23 MED ORDER — SODIUM CHLORIDE 3 % IN NEBU
4.0000 mL | INHALATION_SOLUTION | Freq: Every day | RESPIRATORY_TRACT | Status: DC
  Filled 2017-07-23: qty 4

## 2017-07-23 MED ORDER — INSULIN ASPART 100 UNIT/ML ~~LOC~~ SOLN: 0.0000 [IU] | Freq: Three times a day (TID) | SUBCUTANEOUS | Status: DC

## 2017-07-23 MED ORDER — ATORVASTATIN CALCIUM 20 MG PO TABS: 40.0000 mg | ORAL_TABLET | Freq: Every day | ORAL | Status: DC

## 2017-07-23 MED ORDER — ONDANSETRON HCL 4 MG/2ML IJ SOLN
INTRAMUSCULAR | Status: AC
  Administered 2017-07-23: 4 mg via INTRAVENOUS
  Filled 2017-07-23: qty 2

## 2017-07-23 MED ORDER — OXYCODONE-ACETAMINOPHEN 5-325 MG PO TABS: 2.0000 | ORAL_TABLET | Freq: Four times a day (QID) | ORAL | Status: DC | PRN

## 2017-07-23 MED ORDER — PANTOPRAZOLE SODIUM 40 MG PO TBEC
40.0000 mg | DELAYED_RELEASE_TABLET | Freq: Every day | ORAL | Status: DC
  Administered 2017-07-23: 40 mg via ORAL
  Filled 2017-07-23: qty 1

## 2017-07-23 NOTE — ED Notes (Signed)
Patient discharged to home per MD order. Patient in stable condition, and deemed medically cleared by ED provider for discharge. Discharge instructions reviewed with patient/family using "Teach Back"; verbalized understanding of medication education and administration, and information about follow-up care. Denies further concerns. ° °

## 2017-07-23 NOTE — H&P (View-Only) (Signed)
[facility] Consultation      Assessment and Plan:  Chronic hypoxic and hypercapnic respiratory failure with obesity hypoventilation syndrome s/p tracheostomy. Acute dyspnea --Continue current measures, continue nocturnal ventilation.  --Uncertain etiology of his current dyspnea, does not appear to have any acute respiratory failure. Discussed that the potential endobronchial lesion could cause some dyspnea, but it should be unchanging. Anxiety could play a component. I recommend that he get a home pulse oximeter, and as long as his sat is above 90% without any supplemental oxygen his breathing is doing ok.  --D/w Dr. Gouru, ok to dc from respiratory standpoint.   Left endobronchial lesion.  --Will need bronchoscopy to investigate, given lack of significant growth doubt malignancy, however may still require an intervention if it causing dyspnea.  --He will need to be off of anticoagulation for 48 hours.  --My office will be calling him to arrange outpatient bronchoscopy.    Date: 07/23/2017  MRN# [id number] [name] 05/18/1961  Referring Physician: Dr.Gouru   [name] is a 56 y.o. old male seen in consultation for chief complaint of:    Chief Complaint  Patient presents with  . Shortness of Breath  . Palpitations    HPI:   The patient is a 56-year-old male with history of CHF, obesity hypoventilation syndrome status post tracheostomy, atrial fibrillation, CHF on anticoagulation.  The patient was discharged 3 days ago after he presented to the hospital with chest pain found to have chronic hyponatremia likely secondary to diuresis and alcohol abuse.  I personally reviewed CT chest images from  today as well as 11/02/16 there is a left mainstem endobronchial lesion, this could represent impacted mucus versus endobronchial polyp.  There is no significant mediastinal lymphadenopathy.  The image was also present in May 2018, therefore making mucous plugging  less likely.  It appears to be minimally enlarged to unchanged when changed with previous scan.  Patient presented to the hospital today with complaints of dyspnea on exertion and chest pressure similar to his previous presentation.  He had a ultrasound which showed mildly dilated gallbladder, he was seen by surgery and it was felt that the patient does not appear to have acute cholecystitis.  Currently can continues to complain of dyspnea which has not resolved.  The patient is being any full sentences, he is able to His trach be conversational without any difficulty.  The patient is not currently on oxygen with an oxygen saturation of 95% or above.  He appears to be anxious, he is very upset as he feels that nothing has been done and he feels that he should be admitted to the hospital and undergo a cardiac catheterization. Review of chemistry panel shows elevated CO2 consistent with obesity hypoventilation syndrome.  PMHX:   Past Medical History:  Diagnosis Date  . CHF (congestive heart failure) (HCC)   . Diabetes mellitus (HCC)   . Diabetic peripheral neuropathy (HCC)   . Elevated liver enzymes    fatty liver per [facility]  . GERD (gastroesophageal reflux disease)   . History of pulmonary embolus (PE)   . Hyperlipidemia   . Hypertension   . Myocardial infarct (HCC)   . OSA (obstructive sleep apnea)   . 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 Renal insufficiency    Surgical Hx:  Past Surgical History:  Procedure Laterality Date  . CARDIAC SURGERY     With stent placement  . TRACHEOSTOMY    2007  . VASECTOMY     Family Hx:  Family History  Problem Relation Age of Onset  . Heart attack Mother   . Coronary artery disease Father   . Heart attack Father   . Kidney cancer Neg Hx   . Kidney disease Neg Hx   . Prostate cancer Neg Hx    Social Hx:   Social History   Tobacco Use  . Smoking status: Never Smoker  . Smokeless tobacco: Never Used    Substance Use Topics  . Alcohol use: Yes    Alcohol/week: 3.6 - 4.2 oz    Types: 6 - 7 Cans of beer per week    Comment: Pt states 6-7 a night  . Drug use: No   Medication:    Current Facility-Administered Medications:  .  acetaminophen (TYLENOL) tablet 650 mg, 650 mg, Oral, Q6H PRN **OR** acetaminophen (TYLENOL) suppository 650 mg, 650 mg, Rectal, Q6H PRN, Diamond, Michael S, MD .  apixaban (ELIQUIS) tablet 5 mg, 5 mg, Oral, BID, Diamond, Michael S, MD, 5 mg at 07/23/17 1445 .  atorvastatin (LIPITOR) tablet 40 mg, 40 mg, Oral, q1800, Diamond, Michael S, MD .  collagenase (SANTYL) ointment, , Topical, Daily, Diamond, Michael S, MD .  docusate sodium (COLACE) capsule 100 mg, 100 mg, Oral, BID, Diamond, Michael S, MD .  doxycycline (VIBRA-TABS) tablet 100 mg, 100 mg, Oral, Q12H, Diamond, Michael S, MD .  furosemide (LASIX) tablet 80-160 mg, 80-160 mg, Oral, Daily, Diamond, Michael S, MD .  gabapentin (NEURONTIN) capsule 800 mg, 800 mg, Oral, BID, Diamond, Michael S, MD .  insulin aspart (novoLOG) injection 0-15 Units, 0-15 Units, Subcutaneous, TID WC, Diamond, Michael S, MD .  insulin glargine (LANTUS) injection 15 Units, 15 Units, Subcutaneous, Daily, Diamond, Michael S, MD .  insulin glargine (LANTUS) injection 20 Units, 20 Units, Subcutaneous, QHS, Diamond, Michael S, MD .  lamoTRIgine (LAMICTAL) tablet 125 mg, 125 mg, Oral, BID, Diamond, Michael S, MD .  LORazepam (ATIVAN) tablet 1 mg, 1 mg, Oral, Q6H PRN, Diamond, Michael S, MD, 1 mg at 07/23/17 1445 .  metoprolol tartrate (LOPRESSOR) tablet 100 mg, 100 mg, Oral, BID, Diamond, Michael S, MD, 100 mg at 07/23/17 1445 .  morphine 2 MG/ML injection 2 mg, 2 mg, Intravenous, Q4H PRN, Gouru, Aruna, MD, 2 mg at 07/23/17 0916 .  ondansetron (ZOFRAN) tablet 4 mg, 4 mg, Oral, Q6H PRN **OR** ondansetron (ZOFRAN) injection 4 mg, 4 mg, Intravenous, Q6H PRN, Diamond, Michael S, MD .  oxyCODONE-acetaminophen (PERCOCET/ROXICET) 5-325 MG per tablet 2  tablet, 2 tablet, Oral, QID PRN, Diamond, Michael S, MD .  pantoprazole (PROTONIX) EC tablet 40 mg, 40 mg, Oral, Daily, Diamond, Michael S, MD, 40 mg at 07/23/17 1445 .  sodium chloride HYPERTONIC 3 % nebulizer solution 4 mL, 4 mL, Nebulization, Daily, Diamond, Michael S, MD  Current Outpatient Medications:  .  atorvastatin (LIPITOR) 40 MG tablet, Take 1 tablet (40 mg total) by mouth daily at 6 PM., Disp: 30 tablet, Rfl: 1 .  collagenase (SANTYL) ointment, Apply topically daily., Disp: 15 g, Rfl: 0 .  doxycycline (VIBRA-TABS) 100 MG tablet, Take 1 tablet (100 mg total) by mouth every 12 (twelve) hours for 5 days., Disp: 10 tablet, Rfl: 0 .  ELIQUIS 5 MG TABS tablet, Take 1 tablet (5 mg total) by mouth 2 (two) times daily., Disp: 60 tablet, Rfl: 0 .  furosemide (LASIX) 80 MG tablet, Take 1-2 tablets (80-160 mg total) by mouth daily., Disp: , Rfl:  .    gabapentin (NEURONTIN) 800 MG tablet, Take 800 mg by mouth 2 (two) times daily. 1 tablet twice daily, Disp: , Rfl:  .  insulin detemir (LEVEMIR) 100 unit/ml SOLN, Inject 30-35 Units into the skin 2 (two) times daily. , Disp: , Rfl:  .  insulin lispro (HUMALOG) 100 UNIT/ML injection, Inject 15 Units into the skin 3 (three) times daily with meals. , Disp: , Rfl:  .  lamoTRIgine (LAMICTAL) 25 MG tablet, Take 125 mg by mouth 2 (two) times daily., Disp: , Rfl:  .  metFORMIN (GLUCOPHAGE-XR) 500 MG 24 hr tablet, Take 1,000 mg by mouth 2 (two) times daily. , Disp: , Rfl:  .  metoprolol (LOPRESSOR) 100 MG tablet, Take by mouth. 100 mg twice daily, Disp: , Rfl:  .  omeprazole (PRILOSEC) 20 MG capsule, Take 20 mg by mouth daily. Once daily, Disp: , Rfl:  .  oxyCODONE-acetaminophen (PERCOCET) 10-325 MG tablet, Take 1 tablet by mouth 4 (four) times daily as needed for pain. , Disp: , Rfl:  .  ALPRAZolam (XANAX) 0.5 MG tablet, Take 1 tablet (0.5 mg total) by mouth 3 (three) times daily as needed for up to 12 days for sleep or anxiety., Disp: 12 tablet, Rfl: 0 .   carbamazepine (TEGRETOL) 200 MG tablet, Take 2 tablets (400 mg total) by mouth daily at 10 pm. (Patient not taking: Reported on 07/17/2017), Disp: 30 tablet, Rfl: 0 .  venlafaxine XR (EFFEXOR-XR) 150 MG 24 hr capsule, Take 1 capsule (150 mg total) by mouth daily with breakfast. Once daily (Patient not taking: Reported on 04/09/2017), Disp: 20 capsule, Rfl: 0   Allergies:  Cephalexin; Hctz  [hydrochlorothiazide]; Hydralazine; Penicillins; Reserpine; and Zaroxolyn [metolazone]  Review of Systems: Gen:  Denies  fever, sweats, chills HEENT: Denies blurred vision, double vision. bleeds, sore throat Cvc:  No dizziness, chest pain. Resp:   Denies cough or sputum production, shortness of breath Gi: Denies swallowing difficulty, stomach pain. Gu:  Denies bladder incontinence, burning urine Ext:   No Joint pain, stiffness. Skin: No skin rash,  hives  Endoc:  No polyuria, polydipsia. Psych: No depression, insomnia. Other:  All other systems were reviewed with the patient and were negative other that what is mentioned in the HPI.   Physical Examination:   VS: BP (!) 164/86   Pulse (!) 39   Temp 98.7 F (37.1 C) (Oral)   Resp 13   SpO2 93%   General Appearance: No distress  Neuro:without focal findings,  speech normal,  HEENT: PERRLA, EOM intact. Trach present  Pulmonary: normal breath sounds, clear to auscultation bilaterally no wheezing.  CardiovascularNormal S1,S2.  No m/r/g.   Abdomen: Benign, Soft, non-tender. Renal:  No costovertebral tenderness  GU:  No performed at this time. Endoc: No evident thyromegaly, no signs of acromegaly. Skin:   warm, no rashes, no ecchymosis  Extremities: normal, no cyanosis, clubbing.  Other findings:    LABORATORY PANEL:   CBC Recent Labs  Lab 07/23/17 0018  WBC 6.6  HGB 13.3  HCT 38.7*  PLT 241   ------------------------------------------------------------------------------------------------------------------  Chemistries  Recent Labs    Lab 07/20/17 0156 07/23/17 0153 07/23/17 0437  NA 132* 132*  --   K 3.5 4.5  --   CL 93* 94*  --   CO2 32 29  --   GLUCOSE 181* 166*  --   BUN 15 15  --   CREATININE 0.76 0.70  --   CALCIUM 9.3 9.6  --   MG 1.7  --   --     AST  --   --  26  ALT  --   --  18  ALKPHOS  --   --  88  BILITOT  --   --  0.6   ------------------------------------------------------------------------------------------------------------------  Cardiac Enzymes Recent Labs  Lab 07/23/17 0437  TROPONINI <0.03   ------------------------------------------------------------  RADIOLOGY:  Ct Angio Chest Pe W And/or Wo Contrast  Result Date: 07/23/2017 CLINICAL DATA:  Shortness of breath, heart palpitations, feels bad all day. Bilateral cellulitis. History of CHF, coronary stents, and pulmonary embolus. EXAM: CT ANGIOGRAPHY CHEST WITH CONTRAST TECHNIQUE: Multidetector CT imaging of the chest was performed using the standard protocol during bolus administration of intravenous contrast. Multiplanar CT image reconstructions and MIPs were obtained to evaluate the vascular anatomy. CONTRAST:  100mL ISOVUE-370 IOPAMIDOL (ISOVUE-370) INJECTION 76% COMPARISON:  11/02/2016 FINDINGS: Cardiovascular: Good opacification of the central and segmental pulmonary arteries. No focal filling defects. No evidence of significant pulmonary embolus. Normal caliber thoracic aorta with scattered calcifications. Mild cardiac enlargement with right heart prominence. No pericardial effusion. Coronary artery calcifications and stents. Mediastinum/Nodes: No significant lymphadenopathy in the chest. Esophagus is decompressed. A tracheostomy tube is present. Lungs/Pleura: Evaluation is limited due to motion artifact. No airspace disease or consolidation. No pleural effusions. No pneumothorax. There is a 5 mm polypoid filling defect in the left mainstem bronchus that appears to been present previously. While this could represent mucus, the persistence  of this lesion suggest a possible endobronchial lesion. Consider bronchoscopy for further evaluation. Prominent pleural fat. Upper Abdomen: Gallbladder is in completely included within the field of view but appears to demonstrate a thickened wall. This could indicate cholecystitis in the appropriate clinical setting. Liver disease or other etiologies could also have this appearance. Vascular calcifications in the upper abdomen. Musculoskeletal: No chest wall abnormality. No acute or significant osseous findings. Review of the MIP images confirms the above findings. IMPRESSION: 1. No evidence of significant pulmonary embolus. 2. No evidence of active pulmonary disease. 3. Small endobronchial polypoid lesion in the left mainstem bronchus appears to been present previously. Consider bronchoscopy to exclude significant lesion. 4. Aortic atherosclerosis.  Coronary artery calcification. 5. Suggestion of gallbladder wall thickening. Changes could indicate cholecystitis. Aortic Atherosclerosis (ICD10-I70.0). Electronically Signed   By: William  Stevens M.D.   On: 07/23/2017 03:12   Dg Chest Port 1 View  Result Date: 07/23/2017 CLINICAL DATA:  Shortness of breath. Heart palpitations. Feels bad all day. EXAM: PORTABLE CHEST 1 VIEW COMPARISON:  07/17/2017 FINDINGS: Tracheostomy. Shallow inspiration with elevation of the right hemidiaphragm. Mild cardiac enlargement. No vascular congestion or edema. Lungs are clear. No blunting of costophrenic angles. No pneumothorax. IMPRESSION: Cardiac enlargement.  No evidence of active pulmonary disease. Electronically Signed   By: William  Stevens M.D.   On: 07/23/2017 00:34   Us Abdomen Limited Ruq  Result Date: 07/23/2017 CLINICAL DATA:  Right upper quadrant pain EXAM: ULTRASOUND ABDOMEN LIMITED RIGHT UPPER QUADRANT COMPARISON:  None. FINDINGS: Gallbladder: No gallstones or wall thickening visualized. No sonographic Murphy sign noted by sonographer. Common bile duct: Diameter:  3.9 mm Liver: No focal lesion identified. Increased hepatic parenchymal echogenicity. Portal vein is patent on color Doppler imaging with normal direction of blood flow towards the liver. IMPRESSION: 1. No cholelithiasis or sonographic evidence of acute cholecystitis. 2. Increased hepatic echogenicity as can be seen with hepatic steatosis. Electronically Signed   By: Hetal  Patel   On: 07/23/2017 14:05       Thank  you for the consultation and for allowing [facility]   [facility] to assist in the care of your patient. Our recommendations are noted above.  Please contact us if we can be of further service.   Deep Anatole Apollo, MD.  Board Certified in Internal Medicine, Pulmonary Medicine, Critical Care Medicine, and Sleep Medicine.  [facility] Office Number: [phone]  David Kasa, M.D.  David Simonds, M.D  07/23/2017 

## 2017-07-23 NOTE — ED Notes (Signed)
Cardiology into see patient at this time.

## 2017-07-23 NOTE — H&P (Addendum)
[name] is an 57 y.o. male.   Chief Complaint: Chest pain HPI: The patient with past medical history of CHF, hypertension, diabetes and obesity hypoventilation syndrome status post trach placement presents to the emergency department with chest pain.  The patient states that he has had gradually worsening uneasy feeling with his breathing but he states is more labored and also associated with with a vague chest pain.  The pain started at rest and remains present despite aspirin.  The patient was discharged from the hospital 2 days ago after chest pain rule out.  However, due to ongoing chest pain the emergency department staff called the hospitalist service for further evaluation.  Past Medical History:  Diagnosis Date  . CHF (congestive heart failure) ([place])   . Diabetes mellitus ([place])   . Diabetic peripheral neuropathy ([place])   . Elevated liver enzymes    fatty liver per [facility]  . GERD (gastroesophageal reflux disease)   . History of pulmonary embolus (PE)   . Hyperlipidemia   . Hypertension   . Myocardial infarct ([place])   . OSA (obstructive sleep apnea)   . 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 Renal insufficiency     Past Surgical History:  Procedure Laterality Date  . CARDIAC SURGERY     With stent placement  . TRACHEOSTOMY  2007  . VASECTOMY      Family History  Problem Relation Age of Onset  . Heart attack Mother   . Coronary artery disease Father   . Heart attack Father   . Kidney cancer Neg Hx   . Kidney disease Neg Hx   . Prostate cancer Neg Hx    Social History:  reports that  has never smoked. he has never used smokeless tobacco. He reports that he drinks about 3.6 - 4.2 oz of alcohol per week. He reports that he does not use drugs.  Allergies:  Allergies  Allergen Reactions  . Cephalexin     Other reaction(s): Unknown  . Hctz  [Hydrochlorothiazide]     Other reaction(s): Other (See Comments) hyponatremia  .  Hydralazine   . Penicillins Hives  . Reserpine   . Zaroxolyn [Metolazone]     Other reaction(s): Unknown    Prior to Admission medications   Medication Sig Start Date End Date Taking? Authorizing Provider  atorvastatin (LIPITOR) 40 MG tablet Take 1 tablet (40 mg total) by mouth daily at 6 PM. 11/04/16  Yes Kasa, Maretta Bees, MD  collagenase (SANTYL) ointment Apply topically daily. 07/20/17  Yes Dustin Flock, MD  doxycycline (VIBRA-TABS) 100 MG tablet Take 1 tablet (100 mg total) by mouth every 12 (twelve) hours for 5 days. 07/20/17 07/25/17 Yes Dustin Flock, MD  ELIQUIS 5 MG TABS tablet Take 1 tablet (5 mg total) by mouth 2 (two) times daily. 07/20/17  Yes Dustin Flock, MD  furosemide (LASIX) 80 MG tablet Take 1-2 tablets (80-160 mg total) by mouth daily. 07/20/17  Yes Dustin Flock, MD  gabapentin (NEURONTIN) 800 MG tablet Take 800 mg by mouth 2 (two) times daily. 1 tablet twice daily 01/24/14  Yes [provider]  insulin detemir (LEVEMIR) 100 unit/ml SOLN Inject 30-35 Units into the skin 2 (two) times daily.    Yes [provider]  insulin lispro (HUMALOG) 100 UNIT/ML injection Inject 15 Units into the skin 3 (three) times daily with meals.    Yes [provider]  lamoTRIgine (LAMICTAL) 25 MG tablet Take 125 mg  by mouth 2 (two) times daily. 07/02/17  Yes [provider]  metFORMIN (GLUCOPHAGE-XR) 500 MG 24 hr tablet Take 1,000 mg by mouth 2 (two) times daily.  01/24/14  Yes [provider]  metoprolol (LOPRESSOR) 100 MG tablet Take by mouth. 100 mg twice daily   Yes [provider]  omeprazole (PRILOSEC) 20 MG capsule Take 20 mg by mouth daily. Once daily   Yes [provider]  oxyCODONE-acetaminophen (PERCOCET) 10-325 MG tablet Take 1 tablet by mouth 4 (four) times daily as needed for pain.    Yes [provider]  ALPRAZolam (XANAX) 0.5 MG tablet Take 1 tablet (0.5 mg total) by mouth 3 (three) times daily as needed for up to  12 days for sleep or anxiety. 07/23/17 08/04/17  Gregor Hams, MD  carbamazepine (TEGRETOL) 200 MG tablet Take 2 tablets (400 mg total) by mouth daily at 10 pm. Patient not taking: Reported on 07/17/2017 11/04/16   Flora Lipps, MD  venlafaxine XR (EFFEXOR-XR) 150 MG 24 hr capsule Take 1 capsule (150 mg total) by mouth daily with breakfast. Once daily Patient not taking: Reported on 04/09/2017 11/04/16   Flora Lipps, MD     Results for orders placed or performed during the hospital encounter of 07/22/17 (from the past 48 hour(s))  CBC     Status: Abnormal   Collection Time: 07/23/17 12:18 AM  Result Value Ref Range   WBC 6.6 3.8 - 10.6 K/uL   RBC 4.16 (L) 4.40 - 5.90 MIL/uL   Hemoglobin 13.3 13.0 - 18.0 g/dL   HCT 38.7 (L) 40.0 - 52.0 %   MCV 93.2 80.0 - 100.0 fL   MCH 31.9 26.0 - 34.0 pg   MCHC 34.2 32.0 - 36.0 g/dL   RDW 13.4 11.5 - 14.5 %   Platelets 241 150 - 440 K/uL    Comment: Performed at [facility], [address] [place], [place] [address]  Basic metabolic panel     Status: Abnormal   Collection Time: 07/23/17  1:53 AM  Result Value Ref Range   Sodium 132 (L) 135 - 145 mmol/L   Potassium 4.5 3.5 - 5.1 mmol/L   Chloride 94 (L) 101 - 111 mmol/L   CO2 29 22 - 32 mmol/L   Glucose, Bld 166 (H) 65 - 99 mg/dL   BUN 15 6 - 20 mg/dL   Creatinine, Ser 0.70 0.61 - 1.24 mg/dL   Calcium 9.6 8.9 - 10.3 mg/dL   GFR calc non Af Amer >60 >60 mL/min   GFR calc Af Amer >60 >60 mL/min    Comment: (NOTE) The eGFR has been calculated using the CKD EPI equation. This calculation has not been validated in all clinical situations. eGFR's persistently <60 mL/min signify possible Chronic Kidney Disease.    Anion gap 9 5 - 15    Comment: Performed at [facility], [address] [place], [place] [address]  Troponin I     Status: None   Collection Time: 07/23/17  1:53 AM  Result Value Ref Range   Troponin I <0.03 <0.03 ng/mL    Comment: Performed at [facility], [address] [place], [place] [address]  Troponin I     Status: None   Collection Time: 07/23/17  4:37 AM  Result Value Ref Range   Troponin I <0.03 <0.03 ng/mL    Comment: Performed at [facility], [address] [place], [place] [address]   Ct Angio Chest Pe  W And/or Wo Contrast  Result Date: 07/23/2017 CLINICAL DATA:  Shortness of breath, heart palpitations, feels bad all day. Bilateral cellulitis. History of CHF, coronary stents, and pulmonary embolus. EXAM: CT ANGIOGRAPHY CHEST WITH CONTRAST TECHNIQUE: Multidetector CT imaging of the chest was performed using the standard protocol during bolus administration of intravenous contrast. Multiplanar CT image reconstructions and MIPs were obtained to evaluate the vascular anatomy. CONTRAST:  154m ISOVUE-370 IOPAMIDOL (ISOVUE-370) INJECTION 76% COMPARISON:  11/02/2016 FINDINGS: Cardiovascular: Good opacification of the central and segmental pulmonary arteries. No focal filling defects. No evidence of significant pulmonary embolus. Normal caliber thoracic aorta with scattered calcifications. Mild cardiac enlargement with right heart prominence. No pericardial effusion. Coronary artery calcifications and stents. Mediastinum/Nodes: No significant lymphadenopathy in the chest. Esophagus is decompressed. A tracheostomy tube is present. Lungs/Pleura: Evaluation is limited due to motion artifact. No airspace disease or consolidation. No pleural effusions. No pneumothorax. There is a 5 mm polypoid filling defect in the left mainstem bronchus that appears to been present previously. While this could represent mucus, the persistence of this lesion suggest a possible endobronchial lesion. Consider bronchoscopy for further evaluation. Prominent pleural fat. Upper Abdomen: Gallbladder is in completely included within the field of view but appears to demonstrate a thickened wall. This could indicate cholecystitis in the appropriate clinical  setting. Liver disease or other etiologies could also have this appearance. Vascular calcifications in the upper abdomen. Musculoskeletal: No chest wall abnormality. No acute or significant osseous findings. Review of the MIP images confirms the above findings. IMPRESSION: 1. No evidence of significant pulmonary embolus. 2. No evidence of active pulmonary disease. 3. Small endobronchial polypoid lesion in the left mainstem bronchus appears to been present previously. Consider bronchoscopy to exclude significant lesion. 4. Aortic atherosclerosis.  Coronary artery calcification. 5. Suggestion of gallbladder wall thickening. Changes could indicate cholecystitis. Aortic Atherosclerosis (ICD10-I70.0). Electronically Signed   By: WLucienne CapersM.D.   On: 07/23/2017 03:12   Dg Chest Port 1 View  Result Date: 07/23/2017 CLINICAL DATA:  Shortness of breath. Heart palpitations. Feels bad all day. EXAM: PORTABLE CHEST 1 VIEW COMPARISON:  07/17/2017 FINDINGS: Tracheostomy. Shallow inspiration with elevation of the right hemidiaphragm. Mild cardiac enlargement. No vascular congestion or edema. Lungs are clear. No blunting of costophrenic angles. No pneumothorax. IMPRESSION: Cardiac enlargement.  No evidence of active pulmonary disease. Electronically Signed   By: WLucienne CapersM.D.   On: 07/23/2017 00:34    Review of Systems  Constitutional: Negative for chills and fever.  HENT: Negative for sore throat and tinnitus.   Eyes: Negative for blurred vision and redness.  Respiratory: Positive for sputum production. Negative for cough and shortness of breath.   Cardiovascular: Positive for chest pain. Negative for palpitations, orthopnea and PND.  Gastrointestinal: Negative for abdominal pain, diarrhea, nausea and vomiting.  Genitourinary: Negative for dysuria, frequency and urgency.  Musculoskeletal: Negative for joint pain and myalgias.  Skin: Negative for rash.       No lesions  Neurological: Negative for  speech change, focal weakness and weakness.  Endo/Heme/Allergies: Does not bruise/bleed easily.       No temperature intolerance  Psychiatric/Behavioral: Negative for depression and suicidal ideas.    Blood pressure (!) 142/79, pulse 60, temperature 98.7 F (37.1 C), temperature source Oral, resp. rate 16, SpO2 100 %. Physical Exam  Vitals reviewed. Constitutional: He is oriented to person, place, and time. He appears well-developed and well-nourished. No distress.  HENT:  Head: Normocephalic and atraumatic.  Mouth/Throat: Oropharynx is clear  and moist.  Eyes: Conjunctivae and EOM are normal. Pupils are equal, round, and reactive to light. No scleral icterus.  Neck: Normal range of motion. Neck supple. No JVD present. No tracheal deviation present. No thyromegaly present.  Cardiovascular: Normal rate, regular rhythm and normal heart sounds. Exam reveals no gallop and no friction rub.  No murmur heard. Respiratory: Effort normal and breath sounds normal. No respiratory distress.  Trach in place  GI: Soft. Bowel sounds are normal. He exhibits no distension. There is no tenderness.  Genitourinary:  Genitourinary Comments: Deferred  Musculoskeletal: Normal range of motion. He exhibits edema.  Lymphadenopathy:    He has no cervical adenopathy.  Neurological: He is alert and oriented to person, place, and time.  Skin: Skin is warm and dry. No rash noted. There is erythema (Venous stasis cellulitis of lower extremities).  Psychiatric: He has a normal mood and affect. His behavior is normal. Judgment and thought content normal.     Assessment/Plan This is a 57 year old male admitted for chest pain. 1.  Chest pain: Troponin negative so far.  EKG without indication of myocardial ischemia.  The patient continues to have chest pain although I think this is likely due in large part to anxiety secondary to respiratory effort.  Continue to follow cardiac biomarkers.  Consult cardiology for possible  stress test versus cardiac catheterization.  The patient is n.p.o. 2.  Atrial fibrillation: Paroxysmal; continue metoprolol for rate control.  Patient is on Eliquis 3.  Hyponatremia: Mild; I have ordered hypertonic saline nebulizers for the patient which may decrease thickness of secretions as well as less than the patient's respiratory anxiety and tendency to cough. 4.  Obesity hypoventilation syndrome: The patient is trached; he requires mechanical ventilation at night.  Consult to respiratory therapy for vent management. 5.  CHF: Most recent echo from May 2018 shows normal systolic ejection fraction and wall motion.  There are no valvular abnormalities or evidence of CHF. 6.  DVT prophylaxis: Full dose anticoagulant agent as above 7.  GI prophylaxis: None The patient is a full code.  Time spent on admission orders and patient care approximately 45 minutes  Harrie Foreman, MD 07/23/2017, 6:58 AM

## 2017-07-23 NOTE — Consult Note (Signed)
[facility] Consultation      Assessment and Plan:  Chronic hypoxic and hypercapnic respiratory failure with obesity hypoventilation syndrome s/p tracheostomy. Acute dyspnea --Continue current measures, continue nocturnal ventilation.  --Uncertain etiology of his current dyspnea, does not appear to have any acute respiratory failure. Discussed that the potential endobronchial lesion could cause some dyspnea, but it should be unchanging. Anxiety could play a component. I recommend that he get a home pulse oximeter, and as long as his sat is above 90% without any supplemental oxygen his breathing is doing ok.  --D/w Dr. Amado Coe, ok to dc from respiratory standpoint.   Left endobronchial lesion.  --Will need bronchoscopy to investigate, given lack of significant growth doubt malignancy, however may still require an intervention if it causing dyspnea.  --He will need to be off of anticoagulation for 48 hours.  --My office will be calling him to arrange outpatient bronchoscopy.    Date: 07/23/2017  MRN# [id number] [name] Jun 06, 1961  Referring Physician: Dr.Gouru   [name] is a 57 y.o. old male seen in consultation for chief complaint of:    Chief Complaint  Patient presents with  . Shortness of Breath  . Palpitations    HPI:   The patient is a 57 year old male with history of CHF, obesity hypoventilation syndrome status post tracheostomy, atrial fibrillation, CHF on anticoagulation.  The patient was discharged 3 days ago after he presented to the hospital with chest pain found to have chronic hyponatremia likely secondary to diuresis and alcohol abuse.  I personally reviewed CT chest images from  today as well as 11/02/16 there is a left mainstem endobronchial lesion, this could represent impacted mucus versus endobronchial polyp.  There is no significant mediastinal lymphadenopathy.  The image was also present in May 2018, therefore making mucous plugging  less likely.  It appears to be minimally enlarged to unchanged when changed with previous scan.  Patient presented to the hospital today with complaints of dyspnea on exertion and chest pressure similar to his previous presentation.  He had a ultrasound which showed mildly dilated gallbladder, he was seen by surgery and it was felt that the patient does not appear to have acute cholecystitis.  Currently can continues to complain of dyspnea which has not resolved.  The patient is being any full sentences, he is able to His trach be conversational without any difficulty.  The patient is not currently on oxygen with an oxygen saturation of 95% or above.  He appears to be anxious, he is very upset as he feels that nothing has been done and he feels that he should be admitted to the hospital and undergo a cardiac catheterization. Review of chemistry panel shows elevated CO2 consistent with obesity hypoventilation syndrome.  PMHX:   Past Medical History:  Diagnosis Date  . CHF (congestive heart failure) (HCC)   . Diabetes mellitus (HCC)   . Diabetic peripheral neuropathy (HCC)   . Elevated liver enzymes    fatty liver per [facility]  . GERD (gastroesophageal reflux disease)   . History of pulmonary embolus (PE)   . Hyperlipidemia   . Hypertension   . Myocardial infarct (HCC)   . OSA (obstructive sleep apnea)   . 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 Renal insufficiency    Surgical Hx:  Past Surgical History:  Procedure Laterality Date  . CARDIAC SURGERY     With stent placement  . TRACHEOSTOMY  2007  . VASECTOMY     Family Hx:  Family History  Problem Relation Age of Onset  . Heart attack Mother   . Coronary artery disease Father   . Heart attack Father   . Kidney cancer Neg Hx   . Kidney disease Neg Hx   . Prostate cancer Neg Hx    Social Hx:   Social History   Tobacco Use  . Smoking status: Never Smoker  . Smokeless tobacco: Never Used    Substance Use Topics  . Alcohol use: Yes    Alcohol/week: 3.6 - 4.2 oz    Types: 6 - 7 Cans of beer per week    Comment: Pt states 6-7 a night  . Drug use: No   Medication:    Current Facility-Administered Medications:  .  acetaminophen (TYLENOL) tablet 650 mg, 650 mg, Oral, Q6H PRN **OR** acetaminophen (TYLENOL) suppository 650 mg, 650 mg, Rectal, Q6H PRN, Arnaldo Nataliamond, Michael S, MD .  apixaban Everlene Balls(ELIQUIS) tablet 5 mg, 5 mg, Oral, BID, Arnaldo Nataliamond, Michael S, MD, 5 mg at 07/23/17 1445 .  atorvastatin (LIPITOR) tablet 40 mg, 40 mg, Oral, q1800, Arnaldo Nataliamond, Michael S, MD .  collagenase (SANTYL) ointment, , Topical, Daily, Arnaldo Nataliamond, Michael S, MD .  docusate sodium (COLACE) capsule 100 mg, 100 mg, Oral, BID, Arnaldo Nataliamond, Michael S, MD .  doxycycline (VIBRA-TABS) tablet 100 mg, 100 mg, Oral, Q12H, Arnaldo Nataliamond, Michael S, MD .  furosemide (LASIX) tablet 80-160 mg, 80-160 mg, Oral, Daily, Arnaldo Nataliamond, Michael S, MD .  gabapentin (NEURONTIN) capsule 800 mg, 800 mg, Oral, BID, Arnaldo Nataliamond, Michael S, MD .  insulin aspart (novoLOG) injection 0-15 Units, 0-15 Units, Subcutaneous, TID WC, Arnaldo Nataliamond, Michael S, MD .  insulin glargine (LANTUS) injection 15 Units, 15 Units, Subcutaneous, Daily, Arnaldo Nataliamond, Michael S, MD .  insulin glargine (LANTUS) injection 20 Units, 20 Units, Subcutaneous, QHS, Arnaldo Nataliamond, Michael S, MD .  lamoTRIgine (LAMICTAL) tablet 125 mg, 125 mg, Oral, BID, Arnaldo Nataliamond, Michael S, MD .  LORazepam (ATIVAN) tablet 1 mg, 1 mg, Oral, Q6H PRN, Arnaldo Nataliamond, Michael S, MD, 1 mg at 07/23/17 1445 .  metoprolol tartrate (LOPRESSOR) tablet 100 mg, 100 mg, Oral, BID, Arnaldo Nataliamond, Michael S, MD, 100 mg at 07/23/17 1445 .  morphine 2 MG/ML injection 2 mg, 2 mg, Intravenous, Q4H PRN, Gouru, Aruna, MD, 2 mg at 07/23/17 0916 .  ondansetron (ZOFRAN) tablet 4 mg, 4 mg, Oral, Q6H PRN **OR** ondansetron (ZOFRAN) injection 4 mg, 4 mg, Intravenous, Q6H PRN, Arnaldo Nataliamond, Michael S, MD .  oxyCODONE-acetaminophen (PERCOCET/ROXICET) 5-325 MG per tablet 2  tablet, 2 tablet, Oral, QID PRN, Arnaldo Nataliamond, Michael S, MD .  pantoprazole (PROTONIX) EC tablet 40 mg, 40 mg, Oral, Daily, Arnaldo Nataliamond, Michael S, MD, 40 mg at 07/23/17 1445 .  sodium chloride HYPERTONIC 3 % nebulizer solution 4 mL, 4 mL, Nebulization, Daily, Arnaldo Nataliamond, Michael S, MD  Current Outpatient Medications:  .  atorvastatin (LIPITOR) 40 MG tablet, Take 1 tablet (40 mg total) by mouth daily at 6 PM., Disp: 30 tablet, Rfl: 1 .  collagenase (SANTYL) ointment, Apply topically daily., Disp: 15 g, Rfl: 0 .  doxycycline (VIBRA-TABS) 100 MG tablet, Take 1 tablet (100 mg total) by mouth every 12 (twelve) hours for 5 days., Disp: 10 tablet, Rfl: 0 .  ELIQUIS 5 MG TABS tablet, Take 1 tablet (5 mg total) by mouth 2 (two) times daily., Disp: 60 tablet, Rfl: 0 .  furosemide (LASIX) 80 MG tablet, Take 1-2 tablets (80-160 mg total) by mouth daily., Disp: , Rfl:  .  gabapentin (NEURONTIN) 800 MG tablet, Take 800 mg by mouth 2 (two) times daily. 1 tablet twice daily, Disp: , Rfl:  .  insulin detemir (LEVEMIR) 100 unit/ml SOLN, Inject 30-35 Units into the skin 2 (two) times daily. , Disp: , Rfl:  .  insulin lispro (HUMALOG) 100 UNIT/ML injection, Inject 15 Units into the skin 3 (three) times daily with meals. , Disp: , Rfl:  .  lamoTRIgine (LAMICTAL) 25 MG tablet, Take 125 mg by mouth 2 (two) times daily., Disp: , Rfl:  .  metFORMIN (GLUCOPHAGE-XR) 500 MG 24 hr tablet, Take 1,000 mg by mouth 2 (two) times daily. , Disp: , Rfl:  .  metoprolol (LOPRESSOR) 100 MG tablet, Take by mouth. 100 mg twice daily, Disp: , Rfl:  .  omeprazole (PRILOSEC) 20 MG capsule, Take 20 mg by mouth daily. Once daily, Disp: , Rfl:  .  oxyCODONE-acetaminophen (PERCOCET) 10-325 MG tablet, Take 1 tablet by mouth 4 (four) times daily as needed for pain. , Disp: , Rfl:  .  ALPRAZolam (XANAX) 0.5 MG tablet, Take 1 tablet (0.5 mg total) by mouth 3 (three) times daily as needed for up to 12 days for sleep or anxiety., Disp: 12 tablet, Rfl: 0 .   carbamazepine (TEGRETOL) 200 MG tablet, Take 2 tablets (400 mg total) by mouth daily at 10 pm. (Patient not taking: Reported on 07/17/2017), Disp: 30 tablet, Rfl: 0 .  venlafaxine XR (EFFEXOR-XR) 150 MG 24 hr capsule, Take 1 capsule (150 mg total) by mouth daily with breakfast. Once daily (Patient not taking: Reported on 04/09/2017), Disp: 20 capsule, Rfl: 0   Allergies:  Cephalexin; Hctz  [hydrochlorothiazide]; Hydralazine; Penicillins; Reserpine; and Zaroxolyn [metolazone]  Review of Systems: Gen:  Denies  fever, sweats, chills HEENT: Denies blurred vision, double vision. bleeds, sore throat Cvc:  No dizziness, chest pain. Resp:   Denies cough or sputum production, shortness of breath Gi: Denies swallowing difficulty, stomach pain. Gu:  Denies bladder incontinence, burning urine Ext:   No Joint pain, stiffness. Skin: No skin rash,  hives  Endoc:  No polyuria, polydipsia. Psych: No depression, insomnia. Other:  All other systems were reviewed with the patient and were negative other that what is mentioned in the HPI.   Physical Examination:   VS: BP (!) 164/86   Pulse (!) 39   Temp 98.7 F (37.1 C) (Oral)   Resp 13   SpO2 93%   General Appearance: No distress  Neuro:without focal findings,  speech normal,  HEENT: PERRLA, EOM intact. Trach present  Pulmonary: normal breath sounds, clear to auscultation bilaterally no wheezing.  CardiovascularNormal S1,S2.  No m/r/g.   Abdomen: Benign, Soft, non-tender. Renal:  No costovertebral tenderness  GU:  No performed at this time. Endoc: No evident thyromegaly, no signs of acromegaly. Skin:   warm, no rashes, no ecchymosis  Extremities: normal, no cyanosis, clubbing.  Other findings:    LABORATORY PANEL:   CBC Recent Labs  Lab 07/23/17 0018  WBC 6.6  HGB 13.3  HCT 38.7*  PLT 241   ------------------------------------------------------------------------------------------------------------------  Chemistries  Recent Labs    Lab 07/20/17 0156 07/23/17 0153 07/23/17 0437  NA 132* 132*  --   K 3.5 4.5  --   CL 93* 94*  --   CO2 32 29  --   GLUCOSE 181* 166*  --   BUN 15 15  --   CREATININE 0.76 0.70  --   CALCIUM 9.3 9.6  --   MG 1.7  --   --  AST  --   --  26  ALT  --   --  18  ALKPHOS  --   --  88  BILITOT  --   --  0.6   ------------------------------------------------------------------------------------------------------------------  Cardiac Enzymes Recent Labs  Lab 07/23/17 0437  TROPONINI <0.03   ------------------------------------------------------------  RADIOLOGY:  Ct Angio Chest Pe W And/or Wo Contrast  Result Date: 07/23/2017 CLINICAL DATA:  Shortness of breath, heart palpitations, feels bad all day. Bilateral cellulitis. History of CHF, coronary stents, and pulmonary embolus. EXAM: CT ANGIOGRAPHY CHEST WITH CONTRAST TECHNIQUE: Multidetector CT imaging of the chest was performed using the standard protocol during bolus administration of intravenous contrast. Multiplanar CT image reconstructions and MIPs were obtained to evaluate the vascular anatomy. CONTRAST:  ISOVUE-370 IOPAMIDOL (ISOVUE-370) INJECTION 76% COMPARISON:  11/02/2016 FINDINGS: Cardiovascular: Good opacification of the central and segmental pulmonary arteries. No focal filling defects. No evidence of significant pulmonary embolus. Normal caliber thoracic aorta with scattered calcifications. Mild cardiac enlargement with right heart prominence. No pericardial effusion. Coronary artery calcifications and stents. Mediastinum/Nodes: No significant lymphadenopathy in the chest. Esophagus is decompressed. A tracheostomy tube is present. Lungs/Pleura: Evaluation is limited due to motion artifact. No airspace disease or consolidation. No pleural effusions. No pneumothorax. There is a 5 mm polypoid filling defect in the left mainstem bronchus that appears to been present previously. While this could represent mucus, the persistence  of this lesion suggest a possible endobronchial lesion. Consider bronchoscopy for further evaluation. Prominent pleural fat. Upper Abdomen: Gallbladder is in completely included within the field of view but appears to demonstrate a thickened wall. This could indicate cholecystitis in the appropriate clinical setting. Liver disease or other etiologies could also have this appearance. Vascular calcifications in the upper abdomen. Musculoskeletal: No chest wall abnormality. No acute or significant osseous findings. Review of the MIP images confirms the above findings. IMPRESSION: 1. No evidence of significant pulmonary embolus. 2. No evidence of active pulmonary disease. 3. Small endobronchial polypoid lesion in the left mainstem bronchus appears to been present previously. Consider bronchoscopy to exclude significant lesion. 4. Aortic atherosclerosis.  Coronary artery calcification. 5. Suggestion of gallbladder wall thickening. Changes could indicate cholecystitis. Aortic Atherosclerosis (ICD10-I70.0). Electronically Signed   By: Burman Nieves M.D.   On: 07/23/2017 03:12   Dg Chest Port 1 View  Result Date: 07/23/2017 CLINICAL DATA:  Shortness of breath. Heart palpitations. Feels bad all day. EXAM: PORTABLE CHEST 1 VIEW COMPARISON:  07/17/2017 FINDINGS: Tracheostomy. Shallow inspiration with elevation of the right hemidiaphragm. Mild cardiac enlargement. No vascular congestion or edema. Lungs are clear. No blunting of costophrenic angles. No pneumothorax. IMPRESSION: Cardiac enlargement.  No evidence of active pulmonary disease. Electronically Signed   By: Burman Nieves M.D.   On: 07/23/2017 00:34   US Abdomen Limited Ruq  Result Date: 07/23/2017 CLINICAL DATA:  Right upper quadrant pain EXAM: ULTRASOUND ABDOMEN LIMITED RIGHT UPPER QUADRANT COMPARISON:  None. FINDINGS: Gallbladder: No gallstones or wall thickening visualized. No sonographic Murphy sign noted by sonographer. Common bile duct: Diameter:  3.9 mm Liver: No focal lesion identified. Increased hepatic parenchymal echogenicity. Portal vein is patent on color Doppler imaging with normal direction of blood flow towards the liver. IMPRESSION: 1. No cholelithiasis or sonographic evidence of acute cholecystitis. 2. Increased hepatic echogenicity as can be seen with hepatic steatosis. Electronically Signed   By: Elige Ko   On: 07/23/2017 14:05       Thank  you for the consultation and for allowing [facility] to assist in the care of your patient. Our recommendations are noted above.  Please contact us if we can be of further service.   Wells Guiles, MD.  Board Certified in Internal Medicine, Pulmonary Medicine, Critical Care Medicine, and Sleep Medicine.  [facility] Office Number: [phone]  Santiago Glad, M.D.  Billy Fischer, M.D  07/23/2017

## 2017-07-23 NOTE — ED Notes (Signed)
Patient transported to Ultrasound 

## 2017-07-23 NOTE — Discharge Instructions (Signed)
Follow-up with primary care physician in 5-7 days Follow-up with cardiology Dr. Juliann Paresallwood in 1 week Follow-up with pulmonology Dr. Nicholos Johnsamachandran in 1 week

## 2017-07-23 NOTE — Consult Note (Signed)
[facility] Cardiology Consultation Note  Patient ID: [name], MRN: [id number], DOB/AGE: Jul 10, 1960 57 y.o. Admit date: 07/22/2017   Date of Consult: 07/23/2017 Primary Physician: Hillery Aldo, MD Primary Cardiologist: Call would  Chief Complaint:  Chief Complaint  Patient presents with  . Shortness of Breath  . Palpitations   Reason for Consult: Chest pressure  HPI: 57 y.o. male with known diabetes with diabetic peripheral neuropathy essential hypertension mixed hyperlipidemia obstructive sleep apnea status post tracheostomy with continued diastolic dysfunction congestive heart failure for which the patient has been on appropriate medication management.  Patient recently was in the hospital emergency room and had some respiratory issues although these have been improved.  Since his discharge she has had some generalized chest pressure and tightness of unknown etiology constant in nature over the last 12-24 hours.  This has resulted in no EKG changes and a troponin which is normal.  There is no change in his chest discomfort with activity and no productive sputum or other concerns of worsening COPD.  Chest x-ray she reveals no changes requiring further intervention at this time.  Past Medical History:  Diagnosis Date  . CHF (congestive heart failure) (HCC)   . Diabetes mellitus (HCC)   . Diabetic peripheral neuropathy (HCC)   . Elevated liver enzymes    fatty liver per [facility]  . GERD (gastroesophageal reflux disease)   . History of pulmonary embolus (PE)   . Hyperlipidemia   . Hypertension   . Myocardial infarct (HCC)   . OSA (obstructive sleep apnea)   . 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 Renal insufficiency       Surgical History:  Past Surgical History:  Procedure Laterality Date  . CARDIAC SURGERY     With stent placement  . TRACHEOSTOMY  2007  . VASECTOMY       Home Meds: Prior to Admission medications    Medication Sig Start Date End Date Taking? Authorizing Provider  atorvastatin (LIPITOR) 40 MG tablet Take 1 tablet (40 mg total) by mouth daily at 6 PM. 11/04/16  Yes Kasa, Wallis Bamberg, MD  collagenase (SANTYL) ointment Apply topically daily. 07/20/17  Yes Auburn Bilberry, MD  doxycycline (VIBRA-TABS) 100 MG tablet Take 1 tablet (100 mg total) by mouth every 12 (twelve) hours for 5 days. 07/20/17 07/25/17 Yes Auburn Bilberry, MD  ELIQUIS 5 MG TABS tablet Take 1 tablet (5 mg total) by mouth 2 (two) times daily. 07/20/17  Yes Auburn Bilberry, MD  furosemide (LASIX) 80 MG tablet Take 1-2 tablets (80-160 mg total) by mouth daily. 07/20/17  Yes Auburn Bilberry, MD  gabapentin (NEURONTIN) 800 MG tablet Take 800 mg by mouth 2 (two) times daily. 1 tablet twice daily 01/24/14  Yes [provider]  insulin detemir (LEVEMIR) 100 unit/ml SOLN Inject 30-35 Units into the skin 2 (two) times daily.    Yes [provider]  insulin lispro (HUMALOG) 100 UNIT/ML injection Inject 15 Units into the skin 3 (three) times daily with meals.    Yes [provider]  lamoTRIgine (LAMICTAL) 25 MG tablet Take 125 mg by mouth 2 (two) times daily. 07/02/17  Yes [provider]  metFORMIN (GLUCOPHAGE-XR) 500 MG 24 hr tablet Take 1,000 mg by mouth 2 (two) times daily.  01/24/14  Yes [provider]  metoprolol (LOPRESSOR) 100 MG tablet Take by mouth. 100 mg twice daily   Yes [provider]  omeprazole (PRILOSEC) 20 MG capsule Take  20 mg by mouth daily. Once daily   Yes [provider]  oxyCODONE-acetaminophen (PERCOCET) 10-325 MG tablet Take 1 tablet by mouth 4 (four) times daily as needed for pain.    Yes [provider]  ALPRAZolam (XANAX) 0.5 MG tablet Take 1 tablet (0.5 mg total) by mouth 3 (three) times daily as needed for up to 12 days for sleep or anxiety. 07/23/17 08/04/17  Darci Current, MD  carbamazepine (TEGRETOL) 200 MG tablet Take 2 tablets (400 mg total) by mouth  daily at 10 pm. Patient not taking: Reported on 07/17/2017 11/04/16   Erin Fulling, MD  venlafaxine XR (EFFEXOR-XR) 150 MG 24 hr capsule Take 1 capsule (150 mg total) by mouth daily with breakfast. Once daily Patient not taking: Reported on 04/09/2017 11/04/16   Erin Fulling, MD    Inpatient Medications:  . apixaban  5 mg Oral BID  . atorvastatin  40 mg Oral q1800  . collagenase   Topical Daily  . docusate sodium  100 mg Oral BID  . doxycycline  100 mg Oral Q12H  . furosemide  80-160 mg Oral Daily  . gabapentin  800 mg Oral BID  . insulin aspart  0-15 Units Subcutaneous TID WC  . insulin glargine  15 Units Subcutaneous Daily  . insulin glargine  20 Units Subcutaneous QHS  . lamoTRIgine  125 mg Oral BID  . metoprolol tartrate  100 mg Oral BID  . pantoprazole  40 mg Oral Daily  . sodium chloride HYPERTONIC  4 mL Nebulization Daily     Allergies:  Allergies  Allergen Reactions  . Cephalexin     Other reaction(s): Unknown  . Hctz  [Hydrochlorothiazide]     Other reaction(s): Other (See Comments) hyponatremia  . Hydralazine   . Penicillins Hives  . Reserpine   . Zaroxolyn [Metolazone]     Other reaction(s): Unknown    Social History   Socioeconomic History  . Marital status: Single    Spouse name: Not on file  . Number of children: Not on file  . Years of education: Not on file  . Highest education level: Not on file  Social Needs  . Financial resource strain: Not on file  . Food insecurity - worry: Not on file  . Food insecurity - inability: Not on file  . Transportation needs - medical: Not on file  . Transportation needs - non-medical: Not on file  Occupational History  . Occupation: retired  Tobacco Use  . Smoking status: Never Smoker  . Smokeless tobacco: Never Used  Substance and Sexual Activity  . Alcohol use: Yes    Alcohol/week: 3.6 - 4.2 oz    Types: 6 - 7 Cans of beer per week    Comment: Pt states 6-7 a night  . Drug use: No  . Sexual activity: Not  on file  Other Topics Concern  . Not on file  Social History Narrative  . Not on file     Family History  Problem Relation Age of Onset  . Heart attack Mother   . Coronary artery disease Father   . Heart attack Father   . Kidney cancer Neg Hx   . Kidney disease Neg Hx   . Prostate cancer Neg Hx      Review of Systems Positive for chest pressure shortness of breath Negative for: General:  chills, fever, night sweats or weight changes.  Cardiovascular: PND orthopnea syncope dizziness  Dermatological skin lesions rashes Respiratory: Cough congestion Urologic: Frequent  urination urination at night and hematuria Abdominal: negative for nausea, vomiting, diarrhea, bright red blood per rectum, melena, or hematemesis Neurologic: negative for visual changes, and/or hearing changes  All other systems reviewed and are otherwise negative except as noted above.  Labs: Recent Labs    07/23/17 0153 07/23/17 0437  TROPONINI <0.03 <0.03   Lab Results  Component Value Date   WBC 6.6 07/23/2017   HGB 13.3 07/23/2017   HCT 38.7 (L) 07/23/2017   MCV 93.2 07/23/2017   PLT 241 07/23/2017    Recent Labs  Lab 07/23/17 0153 07/23/17 0437  NA 132*  --   K 4.5  --   CL 94*  --   CO2 29  --   BUN 15  --   CREATININE 0.70  --   CALCIUM 9.6  --   PROT  --  7.0  BILITOT  --  0.6  ALKPHOS  --  88  ALT  --  18  AST  --  26  GLUCOSE 166*  --    Lab Results  Component Value Date   CHOL 125 07/17/2017   HDL 56 07/17/2017   LDLCALC 53 07/17/2017   TRIG 82 07/17/2017   No results found for: DDIMER  Radiology/Studies:  Dg Chest 2 View  Result Date: 07/17/2017 CLINICAL DATA:  Left-sided rib pain. Patient is recovering from a sinus infection. EXAM: CHEST  2 VIEW COMPARISON:  None. FINDINGS: Borderline cardiomegaly with minimal aortic atherosclerosis. Tracheostomy tube tip is centered over the upper trachea. Lungs are clear without pneumonic consolidation. No acute osseous abnormality.  IMPRESSION: Borderline cardiomegaly with aortic atherosclerosis. No active pulmonary disease. Electronically Signed   By: Tollie Ethavid  Kwon M.D.   On: 07/17/2017 17:21   Ct Angio Chest Pe W And/or Wo Contrast  Result Date: 07/23/2017 CLINICAL DATA:  Shortness of breath, heart palpitations, feels bad all day. Bilateral cellulitis. History of CHF, coronary stents, and pulmonary embolus. EXAM: CT ANGIOGRAPHY CHEST WITH CONTRAST TECHNIQUE: Multidetector CT imaging of the chest was performed using the standard protocol during bolus administration of intravenous contrast. Multiplanar CT image reconstructions and MIPs were obtained to evaluate the vascular anatomy. CONTRAST:  100mL ISOVUE-370 IOPAMIDOL (ISOVUE-370) INJECTION 76% COMPARISON:  11/02/2016 FINDINGS: Cardiovascular: Good opacification of the central and segmental pulmonary arteries. No focal filling defects. No evidence of significant pulmonary embolus. Normal caliber thoracic aorta with scattered calcifications. Mild cardiac enlargement with right heart prominence. No pericardial effusion. Coronary artery calcifications and stents. Mediastinum/Nodes: No significant lymphadenopathy in the chest. Esophagus is decompressed. A tracheostomy tube is present. Lungs/Pleura: Evaluation is limited due to motion artifact. No airspace disease or consolidation. No pleural effusions. No pneumothorax. There is a 5 mm polypoid filling defect in the left mainstem bronchus that appears to been present previously. While this could represent mucus, the persistence of this lesion suggest a possible endobronchial lesion. Consider bronchoscopy for further evaluation. Prominent pleural fat. Upper Abdomen: Gallbladder is in completely included within the field of view but appears to demonstrate a thickened wall. This could indicate cholecystitis in the appropriate clinical setting. Liver disease or other etiologies could also have this appearance. Vascular calcifications in the upper  abdomen. Musculoskeletal: No chest wall abnormality. No acute or significant osseous findings. Review of the MIP images confirms the above findings. IMPRESSION: 1. No evidence of significant pulmonary embolus. 2. No evidence of active pulmonary disease. 3. Small endobronchial polypoid lesion in the left mainstem bronchus appears to been present previously. Consider bronchoscopy to exclude significant  lesion. 4. Aortic atherosclerosis.  Coronary artery calcification. 5. Suggestion of gallbladder wall thickening. Changes could indicate cholecystitis. Aortic Atherosclerosis (ICD10-I70.0). Electronically Signed   By: Burman Nieves M.D.   On: 07/23/2017 03:12   Dg Chest Port 1 View  Result Date: 07/23/2017 CLINICAL DATA:  Shortness of breath. Heart palpitations. Feels bad all day. EXAM: PORTABLE CHEST 1 VIEW COMPARISON:  07/17/2017 FINDINGS: Tracheostomy. Shallow inspiration with elevation of the right hemidiaphragm. Mild cardiac enlargement. No vascular congestion or edema. Lungs are clear. No blunting of costophrenic angles. No pneumothorax. IMPRESSION: Cardiac enlargement.  No evidence of active pulmonary disease. Electronically Signed   By: Burman Nieves M.D.   On: 07/23/2017 00:34   US Abdomen Limited Ruq  Result Date: 07/23/2017 CLINICAL DATA:  Right upper quadrant pain EXAM: ULTRASOUND ABDOMEN LIMITED RIGHT UPPER QUADRANT COMPARISON:  None. FINDINGS: Gallbladder: No gallstones or wall thickening visualized. No sonographic Murphy sign noted by sonographer. Common bile duct: Diameter: 3.9 mm Liver: No focal lesion identified. Increased hepatic parenchymal echogenicity. Portal vein is patent on color Doppler imaging with normal direction of blood flow towards the liver. IMPRESSION: 1. No cholelithiasis or sonographic evidence of acute cholecystitis. 2. Increased hepatic echogenicity as can be seen with hepatic steatosis. Electronically Signed   By: Elige Ko   On: 07/23/2017 14:05    EKG: Normal  sinus rhythm  Weights: There were no vitals filed for this visit.   Physical Exam: Blood pressure (!) 164/86, pulse (!) 39, temperature 98.7 F (37.1 C), temperature source Oral, resp. rate 13, SpO2 93 %. There is no height or weight on file to calculate BMI. General: Well developed, well nourished, in no acute distress. Head eyes ears nose throat: Normocephalic, atraumatic, sclera non-icteric, no xanthomas, nares are without discharge. No apparent thyromegaly and/or mass  Lungs: Normal respiratory effort.  no wheezes, no rales, no rhonchi.  Heart: RRR with normal S1 S2. no murmur gallop, no rub, PMI is normal size and placement, carotid upstroke normal without bruit, jugular venous pressure is normal Abdomen: Soft, non-tender, non-distended with normoactive bowel sounds. No hepatomegaly. No rebound/guarding. No obvious abdominal masses. Abdominal aorta is normal size without bruit Extremities: No edema. no cyanosis, no clubbing, no ulcers  Peripheral : 2+ bilateral upper extremity pulses, 2+ bilateral femoral pulses, 2+ bilateral dorsal pedal pulse Neuro: Alert and oriented. No facial asymmetry. No focal deficit. Moves all extremities spontaneously. Musculoskeletal: Normal muscle tone without kyphosis Psych:  Responds to questions appropriately with a normal affect.    Assessment: 57 year old male with COPD and sleep apnea status post tracheostomy with diastolic dysfunction congestive heart failure essential hypertension mixed hyperlipidemia with atypical generalized chest pressure without evidence of exacerbation of heart failure or myocardial infarction  Plan: 1.  Continue medication management and supportive care respiratory condition 2.  No change in current medical regimen for treatment of risk factors for congestive heart failure hypertension 3.  Continue treatment for previous history of pulmonary embolism I 4.  High intensity cholesterol therapy 5.  Further ambulation and follow  for improvements of symptoms 6.  No further cardiac diagnostics necessary at this time  Signed, Lamar Blinks M.D. [facility] Card[facility] 07/23/2017, 5:21 PM

## 2017-07-23 NOTE — ED Provider Notes (Signed)
-----------------------------------------   7:35 PM on 07/23/2017 -----------------------------------------  The hospitalist has called me, they wish to discharge the patient from the emergency department.  They have asked that I would be willing to write a short course of Xanax and discharge with the paperwork provided by the hospitalist.   Minna AntisPaduchowski, Iyah Laguna, MD 07/23/17 [phone]

## 2017-07-23 NOTE — Discharge Summary (Signed)
[facility] - [place] [facility]   PATIENT NAME: [name]    MR#:  [id number]  DATE OF BIRTH:  03/31/1961  DATE OF ADMISSION:  07/22/2017 ADMITTING PHYSICIAN: Arnaldo Natal, MD  DATE OF DISCHARGE: 07/23/17  PRIMARY CARE PHYSICIAN: Hillery Aldo, MD    ADMISSION DIAGNOSIS:  SOB  DISCHARGE DIAGNOSIS:  Active Problems:   Chest pain  OSA  Obesity hypoventilation syndrome  ch atrial fibrillation  SECONDARY DIAGNOSIS:   Past Medical History:  Diagnosis Date  . CHF (congestive heart failure) (HCC)   . Diabetes mellitus (HCC)   . Diabetic peripheral neuropathy (HCC)   . Elevated liver enzymes    fatty liver per [facility]  . GERD (gastroesophageal reflux disease)   . History of pulmonary embolus (PE)   . Hyperlipidemia   . Hypertension   . Myocardial infarct (HCC)   . OSA (obstructive sleep apnea)   . 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 Renal insufficiency     HOSPITAL COURSE:   HPI: The patient with past medical history of CHF, hypertension, diabetes and obesity hypoventilation syndrome status post trach placement presents to the emergency department with chest pain.  The patient states that he has had gradually worsening uneasy feeling with his breathing but he states is more labored and also associated with with a vague chest pain.  The pain started at rest and remains present despite aspirin.  The patient was discharged from the hospital 2 days ago after chest pain rule out.  However, due to ongoing chest pain the emergency department staff called the hospitalist service for further evaluation.  1.  Chest pain:  Troponin negative so far.  EKG without indication of myocardial ischemia. Okay to discharge patient from cardiology standpoint  Chest pain is most likely related to anxiety CT angiogram is negative for pulmonary embolism  2.  Atrial fibrillation: Paroxysmal; continue metoprolol for rate  control.  Patient is on Eliquis  3.  Hyponatremia: Mild; I have ordered hypertonic saline nebulizers for the patient which may decrease thickness of secretions as well as less than the patient's respiratory anxiety and tendency to cough.  4.  Obesity hypoventilation syndrome: The patient is trached; he requires mechanical ventilation at night.  Consult to respiratory therapy for vent management.  5.  CHF: Most recent echo from May 2018 shows normal systolic ejection fraction and wall motion.  There are no valvular abnormalities or evidence of CHF.  6.  Left-sided endobronchial polypoid lesion  Seen on CT angiogram of the chest, seem to be chronic Outpatient follow-up with pulmonology for bronchoscopy Okay to discharge patient from  pulmonology standpoint  6.  Possible cholecystitis per CT chest Abdominal ultrasound is normal.  LFTs are normal.  Okay to discharge patient from surgical standpoint as this is an incidental finding   7.  Generalized anxiety Patient will be given Xanax to take as needed.  Prescription will be given by ED physician     DVT prophylaxis: Full dose anticoagulant agent as above  7.  GI prophylaxis: None The patient is a full code.       DISCHARGE CONDITIONS:   fair  CONSULTS OBTAINED:  Treatment Team:  Leafy Ro, MD   PROCEDURES  None   DRUG ALLERGIES:   Allergies  Allergen Reactions  . Cephalexin     Other reaction(s): Unknown  . Hctz  [Hydrochlorothiazide]     Other reaction(s): Other (See Comments)  hyponatremia  . Hydralazine   . Penicillins Hives  . Reserpine   . Zaroxolyn [Metolazone]     Other reaction(s): Unknown    DISCHARGE MEDICATIONS:   Allergies as of 07/23/2017      Reactions   Cephalexin    Other reaction(s): Unknown   Hctz  [hydrochlorothiazide]    Other reaction(s): Other (See Comments) hyponatremia   Hydralazine    Penicillins Hives   Reserpine    Zaroxolyn [metolazone]    Other reaction(s): Unknown       Medication List    TAKE these medications   ALPRAZolam 0.5 MG tablet Commonly known as:  XANAX Take 1 tablet (0.5 mg total) by mouth 3 (three) times daily as needed for up to 12 days for sleep or anxiety.   atorvastatin 40 MG tablet Commonly known as:  LIPITOR Take 1 tablet (40 mg total) by mouth daily at 6 PM.   carbamazepine 200 MG tablet Commonly known as:  TEGRETOL Take 2 tablets (400 mg total) by mouth daily at 10 pm.   collagenase ointment Commonly known as:  SANTYL Apply topically daily.   doxycycline 100 MG tablet Commonly known as:  VIBRA-TABS Take 1 tablet (100 mg total) by mouth every 12 (twelve) hours for 5 days.   ELIQUIS 5 MG Tabs tablet Generic drug:  apixaban Take 1 tablet (5 mg total) by mouth 2 (two) times daily.   furosemide 80 MG tablet Commonly known as:  LASIX Take 1-2 tablets (80-160 mg total) by mouth daily.   gabapentin 800 MG tablet Commonly known as:  NEURONTIN Take 800 mg by mouth 2 (two) times daily. 1 tablet twice daily   HUMALOG 100 UNIT/ML injection Generic drug:  insulin lispro Inject 15 Units into the skin 3 (three) times daily with meals.   insulin detemir 100 unit/ml Soln Commonly known as:  LEVEMIR Inject 30-35 Units into the skin 2 (two) times daily.   lamoTRIgine 25 MG tablet Commonly known as:  LAMICTAL Take 125 mg by mouth 2 (two) times daily.   metFORMIN 500 MG 24 hr tablet Commonly known as:  GLUCOPHAGE-XR Take 1,000 mg by mouth 2 (two) times daily.   metoprolol tartrate 100 MG tablet Commonly known as:  LOPRESSOR Take by mouth. 100 mg twice daily   omeprazole 20 MG capsule Commonly known as:  PRILOSEC Take 20 mg by mouth daily. Once daily   oxyCODONE-acetaminophen 10-325 MG tablet Commonly known as:  PERCOCET Take 1 tablet by mouth 4 (four) times daily as needed for pain.   venlafaxine XR 150 MG 24 hr capsule Commonly known as:  EFFEXOR-XR Take 1 capsule (150 mg total) by mouth daily with breakfast. Once  daily            Durable Medical Equipment  (From admission, onward)        Start     Ordered   07/23/17 1813  For home use only DME Pulse oximeter  Once     07/23/17 1812       DISCHARGE INSTRUCTIONS:   Follow-up with primary care physician in 5-7 days Follow-up with cardiology Dr. Juliann Pares in 1 week Follow-up with pulmonology Dr. Nicholos Johns in 1 week  DIET:  Cardiac diet  DISCHARGE CONDITION:  Fair  ACTIVITY:  Activity as tolerated  OXYGEN:  Home Oxygen: Yes.     Oxygen Delivery: Chronic tracheostomy liters/min via Patient connected to nasal cannula oxygen  DISCHARGE LOCATION:  home   If you experience worsening of your admission symptoms,  develop shortness of breath, life threatening emergency, suicidal or homicidal thoughts you must seek medical attention immediately by calling 911 or calling your MD immediately  if symptoms less severe.  You Must read complete instructions/literature along with all the possible adverse reactions/side effects for all the Medicines you take and that have been prescribed to you. Take any new Medicines after you have completely understood and accpet all the possible adverse reactions/side effects.   Please note  You were cared for by a hospitalist during your hospital stay. If you have any questions about your discharge medications or the care you received while you were in the hospital after you are discharged, you can call the unit and asked to speak with the hospitalist on call if the hospitalist that took care of you is not available. Once you are discharged, your primary care physician will handle any further medical issues. Please note that NO REFILLS for any discharge medications will be authorized once you are discharged, as it is imperative that you return to your primary care physician (or establish a relationship with a primary care physician if you do not have one) for your aftercare needs so that they can reassess your  need for medications and monitor your lab values.     Today  Chief Complaint  Patient presents with  . Shortness of Breath  . Palpitations   Patient chest pain or shortness of breath is better.  Okay to discharge patient from cardiology, pulmonology and general surgery standpoint  ROS:  CONSTITUTIONAL: Denies fevers, chills. Denies any fatigue, weakness.  EYES: Denies blurry vision, double vision, eye pain. EARS, NOSE, THROAT: Denies tinnitus, ear pain, hearing loss. RESPIRATORY: Denies cough, wheeze, shortness of breath.  Patient has chronic tracheostomy CARDIOVASCULAR: Denies chest pain, palpitations, edema.  GASTROINTESTINAL: Denies nausea, vomiting, diarrhea, abdominal pain. Denies bright red blood per rectum. GENITOURINARY: Denies dysuria, hematuria. ENDOCRINE: Denies nocturia or thyroid problems. HEMATOLOGIC AND LYMPHATIC: Denies easy bruising or bleeding. SKIN: Denies rash or lesion. MUSCULOSKELETAL: Denies pain in neck, back, shoulder, knees, hips or arthritic symptoms.  NEUROLOGIC: Denies paralysis, paresthesias.  PSYCHIATRIC: Denies anxiety or depressive symptoms.   VITAL SIGNS:  Blood pressure (!) 164/86, pulse (!) 39, temperature 98.7 F (37.1 C), temperature source Oral, resp. rate 13, SpO2 93 %.  I/O:  No intake or output data in the 24 hours ending 07/23/17 1840  PHYSICAL EXAMINATION:  GENERAL:  57 y.o.-year-old patient lying in the bed with no acute distress.  EYES: Pupils equal, round, reactive to light and accommodation. No scleral icterus. Extraocular muscles intact.  HEENT: Head atraumatic, normocephalic. Oropharynx and nasopharynx clear.  NECK:  Supple, no jugular venous distention. No thyroid enlargement, no tenderness.  LUNGS: Nml breath sounds bilaterally, no wheezing, rales,rhonchi or crepitation. No use of accessory muscles of respiration.  Chronic tracheostomy CARDIOVASCULAR: S1, S2 normal. No murmurs, rubs, or gallops.  ABDOMEN: Soft, non-tender,  non-distended. Bowel sounds present. No organomegaly or mass.  EXTREMITIES: No pedal edema, cyanosis, or clubbing.  NEUROLOGIC: Cranial nerves II through XII are intact. Muscle strength  at his baseline left-sided endobronchial lesion. Sensation intact. Gait not checked.  PSYCHIATRIC: [name].  SKIN: No obvious rash, lesion, or ulcer.   DATA REVIEW:   CBC Recent Labs  Lab 07/23/17 0018  WBC 6.6  HGB 13.3  HCT 38.7*  PLT 241    Chemistries  Recent Labs  Lab 07/20/17 0156 07/23/17 0153 07/23/17 0437  NA 132* 132*  --  K 3.5 4.5  --   CL 93* 94*  --   CO2 32 29  --   GLUCOSE 181* 166*  --   BUN 15 15  --   CREATININE 0.76 0.70  --   CALCIUM 9.3 9.6  --   MG 1.7  --   --   AST  --   --  26  ALT  --   --  18  ALKPHOS  --   --  88  BILITOT  --   --  0.6    Cardiac Enzymes Recent Labs  Lab 07/23/17 0437  TROPONINI <0.03    Microbiology Results  Results for orders placed or performed during the hospital encounter of 07/17/17  MRSA PCR Screening     Status: None   Collection Time: 07/17/17  8:42 PM  Result Value Ref Range Status   MRSA by PCR NEGATIVE NEGATIVE Final    Comment:        The GeneXpert MRSA Assay (FDA approved for NASAL specimens only), is one component of a comprehensive MRSA colonization surveillance program. It is not intended to diagnose MRSA infection nor to guide or monitor treatment for MRSA infections. Performed at [facility], [address] [place], [place] [address]     RADIOLOGY:  Ct Angio Chest Pe W And/or Wo Contrast  Result Date: 07/23/2017 CLINICAL DATA:  Shortness of breath, heart palpitations, feels bad all day. Bilateral cellulitis. History of CHF, coronary stents, and pulmonary embolus. EXAM: CT ANGIOGRAPHY CHEST WITH CONTRAST TECHNIQUE: Multidetector CT imaging of the chest was performed using the standard protocol during bolus administration of intravenous contrast. Multiplanar CT  image reconstructions and MIPs were obtained to evaluate the vascular anatomy. CONTRAST:  ISOVUE-370 IOPAMIDOL (ISOVUE-370) INJECTION 76% COMPARISON:  11/02/2016 FINDINGS: Cardiovascular: Good opacification of the central and segmental pulmonary arteries. No focal filling defects. No evidence of significant pulmonary embolus. Normal caliber thoracic aorta with scattered calcifications. Mild cardiac enlargement with right heart prominence. No pericardial effusion. Coronary artery calcifications and stents. Mediastinum/Nodes: No significant lymphadenopathy in the chest. Esophagus is decompressed. A tracheostomy tube is present. Lungs/Pleura: Evaluation is limited due to motion artifact. No airspace disease or consolidation. No pleural effusions. No pneumothorax. There is a 5 mm polypoid filling defect in the left mainstem bronchus that appears to been present previously. While this could represent mucus, the persistence of this lesion suggest a possible endobronchial lesion. Consider bronchoscopy for further evaluation. Prominent pleural fat. Upper Abdomen: Gallbladder is in completely included within the field of view but appears to demonstrate a thickened wall. This could indicate cholecystitis in the appropriate clinical setting. Liver disease or other etiologies could also have this appearance. Vascular calcifications in the upper abdomen. Musculoskeletal: No chest wall abnormality. No acute or significant osseous findings. Review of the MIP images confirms the above findings. IMPRESSION: 1. No evidence of significant pulmonary embolus. 2. No evidence of active pulmonary disease. 3. Small endobronchial polypoid lesion in the left mainstem bronchus appears to been present previously. Consider bronchoscopy to exclude significant lesion. 4. Aortic atherosclerosis.  Coronary artery calcification. 5. Suggestion of gallbladder wall thickening. Changes could indicate cholecystitis. Aortic Atherosclerosis  (ICD10-I70.0). Electronically Signed   By: Burman Nieves M.D.   On: 07/23/2017 03:12   Dg Chest Port 1 View  Result Date: 07/23/2017 CLINICAL DATA:  Shortness of breath. Heart palpitations. Feels bad all day. EXAM: PORTABLE CHEST 1 VIEW COMPARISON:  07/17/2017 FINDINGS: Tracheostomy. Shallow inspiration with elevation of the right  hemidiaphragm. Mild cardiac enlargement. No vascular congestion or edema. Lungs are clear. No blunting of costophrenic angles. No pneumothorax. IMPRESSION: Cardiac enlargement.  No evidence of active pulmonary disease. Electronically Signed   By: Burman Nieves M.D.   On: 07/23/2017 00:34   US Abdomen Limited Ruq  Result Date: 07/23/2017 CLINICAL DATA:  Right upper quadrant pain EXAM: ULTRASOUND ABDOMEN LIMITED RIGHT UPPER QUADRANT COMPARISON:  None. FINDINGS: Gallbladder: No gallstones or wall thickening visualized. No sonographic Murphy sign noted by sonographer. Common bile duct: Diameter: 3.9 mm Liver: No focal lesion identified. Increased hepatic parenchymal echogenicity. Portal vein is patent on color Doppler imaging with normal direction of blood flow towards the liver. IMPRESSION: 1. No cholelithiasis or sonographic evidence of acute cholecystitis. 2. Increased hepatic echogenicity as can be seen with hepatic steatosis. Electronically Signed   By: Elige Ko   On: 07/23/2017 14:05    EKG:   Orders placed or performed during the hospital encounter of 07/22/17  . ED EKG  . ED EKG  . EKG 12-Lead  . EKG 12-Lead      Management plans discussed with the patient, family and they are in agreement.  CODE STATUS:     Code Status Orders  (From admission, onward)        Start     Ordered   07/23/17 1337  Full code  Continuous     07/23/17 1337    Code Status History    Date Active Date Inactive Code Status Order ID Comments User Context   07/17/2017 20:20 07/20/2017 13:25 Full Code [id number]  Adrian Saran, MD Inpatient   11/02/2016 20:06 11/04/2016  18:08 Full Code [id number]  Marguarite Arbour, MD Inpatient      TOTAL TIME TAKING CARE OF THIS PATIENT: 43 minutes.   Note: This dictation was prepared with Dragon dictation along with smaller phrase technology. Any transcriptional errors that result from this process are unintentional.   @[facility] @  on 07/23/2017 at 6:40 PM  Between 7am to 6pm - Pager - [phone]  After 6pm go to [email or web address] - password [email or web address] [facility]  [facility]  Office  [phone]  CC: Primary care physician; Hillery Aldo, MD

## 2017-07-23 NOTE — ED Notes (Signed)
Pulmonologist here to see patient at this time.

## 2017-07-23 NOTE — Consult Note (Signed)
Patient ID: [name], male   DOB: 08/25/1960, 57 y.o.   MRN: [id number]  HPI [name] is a 57 y.o. male asked to see in consultation by Dr. Amado CoeGouru for possible cholecystitis.  Patient came in today to the emergency room complaining of dyspnea on exertion as well as at rest.  Some chest pressure type of symptoms.  Of note the patient was recently admitted for possible angina was rule out MI. I have personally reviewed his CT scan for pre-protocol.  The gallbladder is mildly dilated.  But no evidence of acute cholecystitis there is no evidence of free air or abscess. Patient denies any abdominal pain specifically no nausea no vomiting no fevers. He does have a tracheostomy for hypoventilation sy syndrome.  He does have severe sleep apnea get support with the ventilator at night.  HPI  Past Medical History:  Diagnosis Date  . CHF (congestive heart failure) (HCC)   . Diabetes mellitus (HCC)   . Diabetic peripheral neuropathy (HCC)   . Elevated liver enzymes    fatty liver per [facility]  . GERD (gastroesophageal reflux disease)   . History of pulmonary embolus (PE)   . Hyperlipidemia   . Hypertension   . Myocardial infarct (HCC)   . OSA (obstructive sleep apnea)   . [facility]reat[facility]    [facility]t [facility] induced with ARDS  . Renal insufficiency     Past Surgical History:  Procedure Laterality Date  . CARDIAC SURGERY     With stent placement  . TRACHEOSTOMY  2007  . VASECTOMY      Family History  Problem Relation Age of Onset  . Heart attack Mother   . Coronary artery disease Father   . Heart attack Father   . Kidney cancer Neg Hx   . Kidney disease Neg Hx   . Prostate cancer Neg Hx     Social History Social History   Tobacco Use  . Smoking status: Never Smoker  . Smokeless tobacco: Never Used  Substance Use Topics  . Alcohol use: Yes    Alcohol/week: 3.6 - 4.2 oz    Types: 6 - 7 Cans of beer per week    Comment: Pt states 6-7 a night  .  Drug use: No    Allergies  Allergen Reactions  . Cephalexin     Other reaction(s): Unknown  . Hctz  [Hydrochlorothiazide]     Other reaction(s): Other (See Comments) hyponatremia  . Hydralazine   . Penicillins Hives  . Reserpine   . Zaroxolyn [Metolazone]     Other reaction(s): Unknown    Current Facility-Administered Medications  Medication Dose Route Frequency Provider Last Rate Last Dose  . acetaminophen (TYLENOL) tablet 650 mg  650 mg Oral Q6H PRN Arnaldo Nataliamond, Michael S, MD       Or  . acetaminophen (TYLENOL) suppository 650 mg  650 mg Rectal Q6H PRN Arnaldo Nataliamond, Michael S, MD      . apixaban Everlene Balls(ELIQUIS) tablet 5 mg  5 mg Oral BID Arnaldo Nataliamond, Michael S, MD      . atorvastatin (LIPITOR) tablet 40 mg  40 mg Oral q1800 Arnaldo Nataliamond, Michael S, MD      . collagenase (SANTYL) ointment   Topical Daily Arnaldo Nataliamond, Michael S, MD      . docusate sodium (COLACE) capsule 100 mg  100 mg Oral BID Arnaldo Nataliamond, Michael S, MD      . doxycycline (VIBRA-TABS) tablet 100 mg  100 mg Oral Q12H Arnaldo Nataliamond, Michael S,  MD      . furosemide (LASIX) tablet 80-160 mg  80-160 mg Oral Daily Arnaldo Natal, MD      . gabapentin (NEURONTIN) tablet 800 mg  800 mg Oral BID Arnaldo Natal, MD      . insulin aspart (novoLOG) injection 0-15 Units  0-15 Units Subcutaneous TID WC Arnaldo Natal, MD      . insulin glargine (LANTUS) injection 15 Units  15 Units Subcutaneous Daily Arnaldo Natal, MD      . insulin glargine (LANTUS) injection 20 Units  20 Units Subcutaneous QHS Arnaldo Natal, MD      . lamoTRIgine (LAMICTAL) tablet 125 mg  125 mg Oral BID Arnaldo Natal, MD      . LORazepam (ATIVAN) tablet 1 mg  1 mg Oral Q6H PRN Arnaldo Natal, MD      . metoprolol tartrate (LOPRESSOR) tablet 100 mg  100 mg Oral BID Arnaldo Natal, MD      . morphine 2 MG/ML injection 2 mg  2 mg Intravenous Q4H PRN Ramonita Lab, MD   2 mg at 07/23/17 0916  . ondansetron (ZOFRAN) tablet 4 mg  4 mg Oral Q6H PRN Arnaldo Natal,  MD       Or  . ondansetron [facility]) injection 4 mg  4 mg Intravenous Q6H PRN Arnaldo Natal, MD      . oxyCODONE-acetaminophen (PERCOCET) 10-325 MG per tablet 1 tablet  1 tablet Oral QID PRN Arnaldo Natal, MD      . pantoprazole (PROTONIX) EC tablet 40 mg  40 mg Oral Daily Arnaldo Natal, MD      . sodium chloride HYPERTONIC 3 % nebulizer solution 4 mL  4 mL Nebulization Daily Arnaldo Natal, MD       Current Outpatient Medications  Medication Sig Dispense Refill  . atorvastatin (LIPITOR) 40 MG tablet Take 1 tablet (40 mg total) by mouth daily at 6 PM. 30 tablet 1  . collagenase (SANTYL) ointment Apply topically daily. 15 g 0  . doxycycline (VIBRA-TABS) 100 MG tablet Take 1 tablet (100 mg total) by mouth every 12 (twelve) hours for 5 days. 10 tablet 0  . ELIQUIS 5 MG TABS tablet Take 1 tablet (5 mg total) by mouth 2 (two) times daily. 60 tablet 0  . furosemide (LASIX) 80 MG tablet Take 1-2 tablets (80-160 mg total) by mouth daily.    Marland Kitchen gabapentin (NEURONTIN) 800 MG tablet Take 800 mg by mouth 2 (two) times daily. 1 tablet twice daily    . insulin detemir (LEVEMIR) 100 unit/ml SOLN Inject 30-35 Units into the skin 2 (two) times daily.     . insulin lispro (HUMALOG) 100 UNIT/ML injection Inject 15 Units into the skin 3 (three) times daily with meals.     . lamoTRIgine (LAMICTAL) 25 MG tablet Take 125 mg by mouth 2 (two) times daily.    . metFORMIN (GLUCOPHAGE-XR) 500 MG 24 hr tablet Take 1,000 mg by mouth 2 (two) times daily.     . metoprolol (LOPRESSOR) 100 MG tablet Take by mouth. 100 mg twice daily    . omeprazole (PRILOSEC) 20 MG capsule Take 20 mg by mouth daily. Once daily    . oxyCODONE-acetaminophen (PERCOCET) 10-325 MG tablet Take 1 tablet by mouth 4 (four) times daily as needed for pain.     Marland Kitchen ALPRAZolam (XANAX) 0.5 MG tablet Take 1 tablet (0.5 mg total) by mouth 3 (three) times daily as needed  for up to 12 days for sleep or anxiety. 12 tablet 0  . carbamazepine  (TEGRETOL) 200 MG tablet Take 2 tablets (400 mg total) by mouth daily at 10 pm. (Patient not taking: Reported on 07/17/2017) 30 tablet 0  . venlafaxine XR (EFFEXOR-XR) 150 MG 24 hr capsule Take 1 capsule (150 mg total) by mouth daily with breakfast. Once daily (Patient not taking: Reported on 04/09/2017) 20 capsule 0     Review of Systems Full ROS  was asked and was negative except for the information on the HPI  Physical Exam Blood pressure 131/68, pulse 61, temperature 98.7 F (37.1 C), temperature source Oral, resp. rate 14, SpO2 92 %. CONSTITUTIONAL: morbidly obese male in NAD. EYES: Pupils are equal, round, and reactive to light, Sclera are non-icteric. EARS, NOSE, MOUTH AND THROAT: The oropharynx is clear. The oral mucosa is pink and moist. Hearing is intact to voice. LYMPH NODES:  Lymph nodes in the neck are normal. Neck Trach in place,no infection RESPIRATORY:  Lungs are clear. There is normal respiratory effort, with equal breath sounds bilaterally, and without pathologic use of accessory muscles. CARDIOVASCULAR: Heart is regular without murmurs, gallops, or rubs. GI: The abdomen is  soft, nontender, and nondistended. There are no palpable masses. There is no hepatosplenomegaly. There are normal bowel sounds in all quadrants. GU: Rectal deferred.   MUSCULOSKELETAL: Normal muscle strength and tone. No cyanosis .   SKIN: Turgor is good and there are no pathologic skin lesions or ulcers. NEUROLOGIC: Motor and sensation is grossly normal. Cranial nerves are grossly intact. PSYCH:  Oriented to person, place and time. Affect is normal.  Data Reviewed  I have personally reviewed the patient's imaging, laboratory findings and medical records.    Assessment Plan   Incidental findings on CT scan.  Clinically there is no evidence of acute cholecystitis I will obtain right upper quadrant ultrasound to better assess his gallbladder but at this point I do think that most of his symptoms are  respiratory or cardiac related.  If his ultrasound is negative we will sign off.  No need for any surgical intervention at this time.  Case discussed with primary physician in detail as well as patient.  Sterling Big, MD FACS General Surgeon 07/23/2017, 2:25 PM

## 2017-07-23 NOTE — ED Notes (Signed)
Pt returned from US at this time.

## 2017-07-23 NOTE — ED Notes (Signed)
Pt to talk in complete sentences. Calling out to use phone. In no distress.

## 2017-07-23 NOTE — ED Provider Notes (Signed)
[facility] Emergency Department Provider Note    First MD Initiated Contact with Patient 07/22/17 2353     (approximate)  I have reviewed the triage vital signs and the nursing notes.   HISTORY  Chief Complaint Shortness of Breath and Palpitations    HPI [name] is a 57 y.o. male with below list of chronic medical conditions including atrial fibrillation CHF and current bilateral lower extremity cellulitis presents to the emergency department with acute onset of dyspnea and heart palpitation began approximately 2 hours before arrival.  Patient also admits to central nonradiating chest pain with current pain score of 8 out of 10 and described as squeezing.   Past Medical History:  Diagnosis Date  . CHF (congestive heart failure) (HCC)   . Diabetes mellitus (HCC)   . Diabetic peripheral neuropathy (HCC)   . Elevated liver enzymes    fatty liver [facility]  . GERD (gastroesophageal reflux disease)   . History of pulmonary embolus (PE)   . Hyperlipidemia   . Hypertension   . Myocardial infarct (HCC)   . OSA (obstructive sleep apnea)   . 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 Renal insufficiency     Patient Active Problem List   Diagnosis Date Noted  . Atrial fibrillation (HCC) 11/02/2016  . Chest pain 11/02/2016  . Hyponatremia 11/02/2016  . Chronic respiratory failure, unspecified whether with hypoxia or hypercapnia (HCC) 05/16/2014  . Ventilator dependence (HCC) 05/16/2014  . Obesity hypoventilation syndrome (HCC) 05/16/2014  . 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 CARDIAC SURGERY     With stent placement  . TRACHEOSTOMY  2007  . VASECTOMY      Prior to Admission medications   Medication Sig Start Date End Date Taking? Authorizing Provider  atorvastatin (LIPITOR) 40 MG tablet Take 1 tablet (40 mg total) by mouth daily at 6 PM. 11/04/16  Yes  Kasa, Wallis Bamberg, MD  collagenase (SANTYL) ointment Apply topically daily. 07/20/17  Yes Auburn Bilberry, MD  doxycycline (VIBRA-TABS) 100 MG tablet Take 1 tablet (100 mg total) by mouth every 12 (twelve) hours for 5 days. 07/20/17 07/25/17 Yes Auburn Bilberry, MD  ELIQUIS 5 MG TABS tablet Take 1 tablet (5 mg total) by mouth 2 (two) times daily. 07/20/17  Yes Auburn Bilberry, MD  furosemide (LASIX) 80 MG tablet Take 1-2 tablets (80-160 mg total) by mouth daily. 07/20/17  Yes Auburn Bilberry, MD  gabapentin (NEURONTIN) 800 MG tablet Take 800 mg by mouth 2 (two) times daily. 1 tablet twice daily 01/24/14  Yes [provider]  insulin detemir (LEVEMIR) 100 unit/ml SOLN Inject 30-35 Units into the skin 2 (two) times daily.    Yes [provider]  insulin lispro (HUMALOG) 100 UNIT/ML injection Inject 15 Units into the skin 3 (three) times daily with meals.    Yes [provider]  lamoTRIgine (LAMICTAL) 25 MG tablet Take 125 mg by mouth 2 (two) times daily. 07/02/17  Yes [provider]  metFORMIN (GLUCOPHAGE-XR) 500 MG 24 hr tablet Take 1,000 mg by mouth 2 (two) times daily.  01/24/14  Yes [provider]  metoprolol (LOPRESSOR) 100 MG tablet Take by mouth. 100 mg twice daily   Yes [provider]  omeprazole (PRILOSEC) 20 MG capsule Take 20 mg by mouth daily. Once daily   Yes [provider]  oxyCODONE-acetaminophen (PERCOCET) 10-325 MG tablet Take 1 tablet  by mouth 4 (four) times daily as needed for pain.    Yes [provider]  carbamazepine (TEGRETOL) 200 MG tablet Take 2 tablets (400 mg total) by mouth daily at 10 pm. Patient not taking: Reported on 07/17/2017 11/04/16   Erin FullingKasa, Kurian, MD  venlafaxine XR (EFFEXOR-XR) 150 MG 24 hr capsule Take 1 capsule (150 mg total) by mouth daily with breakfast. Once daily Patient not taking: Reported on 04/09/2017 11/04/16   Erin FullingKasa, Kurian, MD    Allergies Cephalexin; Hctz  [hydrochlorothiazide]; Hydralazine;  Penicillins; Reserpine; and Zaroxolyn [metolazone]  Family History  Problem Relation Age of Onset  . Heart attack Mother   . Coronary artery disease Father   . Heart attack Father   . Kidney cancer Neg Hx   . Kidney disease Neg Hx   . Prostate cancer Neg Hx     Social History Social History   Tobacco Use  . Smoking status: Never Smoker  . Smokeless tobacco: Never Used  Substance Use Topics  . Alcohol use: Yes    Alcohol/week: 3.6 - 4.2 oz    Types: 6 - 7 Cans of beer per week    Comment: Pt states 6-7 a night  . Drug use: No    Review of Systems Constitutional: No fever/chills Eyes: No visual changes. ENT: No sore throat. Cardiovascular: Positive for chest pain. Respiratory: Positive for shortness of breath. Gastrointestinal: No abdominal pain.  No nausea, no vomiting.  No diarrhea.  No constipation. Genitourinary: Negative for dysuria. Musculoskeletal: Negative for neck pain.  Negative for back pain. Integumentary: Negative for rash. Neurological: Negative for headaches, focal weakness or numbness. Psychiatric:Positive for "feeling anxious"   ____________________________________________   PHYSICAL EXAM:  VITAL SIGNS: ED Triage Vitals [07/22/17 2352]  Enc Vitals Group     BP      Pulse Rate (!) 123     Resp      Temp 98.7 F (37.1 C)     Temp Source Oral     SpO2 100 %     Weight      Height      Head Circumference      Peak Flow      Pain Score      Pain Loc      Pain Edu?      Excl. in GC?     Constitutional: Alert and oriented. Well appearing and in no acute distress. Eyes: Conjunctivae are normal. PERRL. EOMI. Head: Atraumatic. Mouth/Throat: Mucous membranes are moist.  Oropharynx non-erythematous. Neck: No stridor.   Cardiovascular: Normal rate, regular rhythm. Good peripheral circulation. Grossly normal heart sounds. Respiratory: Normal respiratory effort.  No retractions. Lungs CTAB. Gastrointestinal: Soft and nontender. No distention.    Musculoskeletal: No lower extremity tenderness nor edema. No gross deformities of extremities. Neurologic:  Normal speech and language. No gross focal neurologic deficits are appreciated.  Skin:  Skin is warm, dry and intact. No rash noted. Psychiatric: Mood and affect are normal. Speech and behavior are normal.  ____________________________________________   LABS (all labs ordered are listed, but only abnormal results are displayed)  Labs Reviewed  CBC - Abnormal; Notable for the following components:      Result Value   RBC 4.16 (*)    HCT 38.7 (*)    All other components within normal limits  BASIC METABOLIC PANEL  TROPONIN I   ____________________________________________  EKG  ED ECG REPORT I, [place] N BROWN, the attending physician, personally viewed and interpreted this ECG.  Date: 07/23/2017  EKG Time: 8:00 AM  Rate: 109  Rhythm: Atrial flutter with rapid ventricular response  Axis: Normal  Intervals: Irregular PR interval  ST&T Change: None  ____________________________________________  RADIOLOGY I, [place] N BROWN, personally viewed and evaluated these images (plain radiographs) as part of my medical decision making, as well as reviewing the written report by the radiologist.  ED MD interpretation: No acute intrathoracic process  Official radiology report(s): Dg Chest Port 1 View  Result Date: 07/23/2017 CLINICAL DATA:  Shortness of breath. Heart palpitations. Feels bad all day. EXAM: PORTABLE CHEST 1 VIEW COMPARISON:  07/17/2017 FINDINGS: Tracheostomy. Shallow inspiration with elevation of the right hemidiaphragm. Mild cardiac enlargement. No vascular congestion or edema. Lungs are clear. No blunting of costophrenic angles. No pneumothorax. IMPRESSION: Cardiac enlargement.  No evidence of active pulmonary disease. Electronically Signed   By: Burman Nieves M.D.   On: 07/23/2017 00:34       Procedures   ____________________________________________   INITIAL IMPRESSION / ASSESSMENT AND PLAN / ED COURSE  As part of my medical decision making, I reviewed the following data within the electronic MEDICAL RECORD [id number] year old male presented with above-stated history and physical exam secondary to chest pain and shortness of breath.  Consider to possibly pulmonary emboli especially given the fact the patient has had one before however CT scan of the chest revealed no acute pulmonary emboli.  Also consider possibly of CAD however troponin negative x2 EKG revealed no evidence of ischemia.  EKG did however revealed atrial flutter with rapid ventricular response patient given IV morphine and aspirin without improvement of pain.  As such patient discussed with Dr. Sheryle Hail for hospital admission for further given the fact the patient has ongoing chest pain ____________________________________________  FINAL CLINICAL IMPRESSION(S) / ED DIAGNOSES  Final diagnoses:  Shortness of breath  Chest pain, unspecified type     MEDICATIONS GIVEN DURING THIS VISIT:  Medications - No data to display   ED Discharge Orders    None       Note:  This document was prepared using Dragon voice recognition software and may include unintentional dictation errors.    Darci Current, MD 07/23/17 0600

## 2017-07-23 NOTE — ED Notes (Signed)
Patient transported to CT 

## 2017-07-24 ENCOUNTER — Telehealth: Payer: Self-pay | Admitting: *Deleted

## 2017-07-24 ENCOUNTER — Other Ambulatory Visit: Payer: Self-pay | Admitting: Internal Medicine

## 2017-07-24 NOTE — Telephone Encounter (Signed)
Bronch scheduled  For 07/28/17 at 1 pm. Pt aware NPO after midnight. Pt aware d/c anticoag therapy on Sat/Sun. Pt aware arrive medical mall 12 noon.

## 2017-07-24 NOTE — Telephone Encounter (Signed)
-----   Message from Shane CrutchPradeep Ramachandran, MD sent at 07/23/2017  5:31 PM EST ----- Regarding: needs outpatient bronch.  Pt seen in ED, ordered outpatient bronch (non-EBUS, non-Fluoro) for left endobronchial lesion. Pt has a trach so needs smallest available bronchoscopy.  On anticoagulation, needs to be off for 48 hours.

## 2017-07-25 NOTE — Telephone Encounter (Signed)
Morrie Sheldonshley from [facility] calling in regards to procedure for Monday Has questions to be clarified Please call at [phone]

## 2017-07-25 NOTE — Telephone Encounter (Signed)
Confirmed that Bronch is a regular bronch. Morrie Sheldonshley states it is posted as if to be done in OR. Please confirm?

## 2017-07-25 NOTE — Telephone Encounter (Signed)
Regular bronch, not in the OR.

## 2017-07-28 ENCOUNTER — Ambulatory Visit
Admission: RE | Admit: 2017-07-28 | Discharge: 2017-07-28 | Disposition: A | Discharge status: Home / Self Care | Payer: Medicare HMO | Source: Ambulatory Visit | Attending: Internal Medicine | Admitting: Internal Medicine

## 2017-07-28 ENCOUNTER — Encounter: Payer: Self-pay | Admitting: *Deleted

## 2017-07-28 ENCOUNTER — Encounter
Admission: RE | Disposition: A | Discharge status: Home / Self Care | Payer: Self-pay | Source: Ambulatory Visit | Attending: Internal Medicine

## 2017-07-28 DIAGNOSIS — E1142 Type 2 diabetes mellitus with diabetic polyneuropathy: Secondary | ICD-10-CM | POA: Diagnosis not present

## 2017-07-28 DIAGNOSIS — I509 Heart failure, unspecified: Secondary | ICD-10-CM | POA: Insufficient documentation

## 2017-07-28 DIAGNOSIS — Z880 Allergy status to penicillin: Secondary | ICD-10-CM | POA: Diagnosis not present

## 2017-07-28 DIAGNOSIS — K219 Gastro-esophageal reflux disease without esophagitis: Secondary | ICD-10-CM | POA: Insufficient documentation

## 2017-07-28 DIAGNOSIS — Z79899 Other long term (current) drug therapy: Secondary | ICD-10-CM | POA: Diagnosis not present

## 2017-07-28 DIAGNOSIS — Z794 Long term (current) use of insulin: Secondary | ICD-10-CM | POA: Diagnosis not present

## 2017-07-28 DIAGNOSIS — G4733 Obstructive sleep apnea (adult) (pediatric): Secondary | ICD-10-CM | POA: Diagnosis not present

## 2017-07-28 DIAGNOSIS — I252 Old myocardial infarction: Secondary | ICD-10-CM | POA: Diagnosis not present

## 2017-07-28 DIAGNOSIS — I4891 Unspecified atrial fibrillation: Secondary | ICD-10-CM | POA: Diagnosis not present

## 2017-07-28 DIAGNOSIS — R0602 Shortness of breath: Secondary | ICD-10-CM | POA: Insufficient documentation

## 2017-07-28 DIAGNOSIS — E785 Hyperlipidemia, unspecified: Secondary | ICD-10-CM | POA: Diagnosis not present

## 2017-07-28 DIAGNOSIS — Z881 Allergy status to other antibiotic agents status: Secondary | ICD-10-CM | POA: Insufficient documentation

## 2017-07-28 DIAGNOSIS — I110 Hypertensive heart disease with heart failure: Secondary | ICD-10-CM | POA: Insufficient documentation

## 2017-07-28 DIAGNOSIS — Z86711 Personal history of pulmonary embolism: Secondary | ICD-10-CM | POA: Insufficient documentation

## 2017-07-28 DIAGNOSIS — F419 Anxiety disorder, unspecified: Secondary | ICD-10-CM | POA: Insufficient documentation

## 2017-07-28 DIAGNOSIS — Z955 Presence of coronary angioplasty implant and graft: Secondary | ICD-10-CM | POA: Insufficient documentation

## 2017-07-28 DIAGNOSIS — Z930 Tracheostomy status: Secondary | ICD-10-CM | POA: Insufficient documentation

## 2017-07-28 DIAGNOSIS — R911 Solitary pulmonary nodule: Secondary | ICD-10-CM | POA: Diagnosis not present

## 2017-07-28 DIAGNOSIS — Z7901 Long term (current) use of anticoagulants: Secondary | ICD-10-CM | POA: Diagnosis not present

## 2017-07-28 DIAGNOSIS — E662 Morbid (severe) obesity with alveolar hypoventilation: Secondary | ICD-10-CM | POA: Diagnosis not present

## 2017-07-28 DIAGNOSIS — Z6839 Body mass index (BMI) 39.0-39.9, adult: Secondary | ICD-10-CM | POA: Diagnosis not present

## 2017-07-28 DIAGNOSIS — I700 Atherosclerosis of aorta: Secondary | ICD-10-CM | POA: Diagnosis not present

## 2017-07-28 DIAGNOSIS — J9809 Other diseases of bronchus, not elsewhere classified: Secondary | ICD-10-CM | POA: Insufficient documentation

## 2017-07-28 DIAGNOSIS — K760 Fatty (change of) liver, not elsewhere classified: Secondary | ICD-10-CM | POA: Diagnosis not present

## 2017-07-28 HISTORY — PX: FLEXIBLE BRONCHOSCOPY: SHX5094

## 2017-07-28 SURGERY — BRONCHOSCOPY, FLEXIBLE
Anesthesia: Moderate Sedation

## 2017-07-28 MED ORDER — LIDOCAINE HCL 2 % EX GEL
1.0000 "application " | Freq: Once | CUTANEOUS | Status: AC
  Administered 2017-07-28: 1 via TOPICAL

## 2017-07-28 MED ORDER — MIDAZOLAM HCL 5 MG/5ML IJ SOLN
INTRAMUSCULAR | Status: AC
  Filled 2017-07-28: qty 10

## 2017-07-28 MED ORDER — MIDAZOLAM HCL 5 MG/5ML IJ SOLN
INTRAMUSCULAR | Status: AC | PRN
  Administered 2017-07-28 (×3): 2 mg via INTRAVENOUS
  Administered 2017-07-28: 1 mg via INTRAVENOUS

## 2017-07-28 MED ORDER — FENTANYL CITRATE (PF) 100 MCG/2ML IJ SOLN
INTRAMUSCULAR | Status: AC | PRN
  Administered 2017-07-28: 25 ug via INTRAVENOUS
  Administered 2017-07-28 (×2): 50 ug via INTRAVENOUS

## 2017-07-28 MED ORDER — BUTAMBEN-TETRACAINE-BENZOCAINE 2-2-14 % EX AERO
INHALATION_SPRAY | CUTANEOUS | Status: AC
  Filled 2017-07-28: qty 5

## 2017-07-28 MED ORDER — BUTAMBEN-TETRACAINE-BENZOCAINE 2-2-14 % EX AERO
1.0000 | INHALATION_SPRAY | Freq: Once | CUTANEOUS | Status: AC
  Administered 2017-07-28: 1 via TOPICAL

## 2017-07-28 MED ORDER — PHENYLEPHRINE HCL 0.25 % NA SOLN
1.0000 | Freq: Four times a day (QID) | NASAL | Status: DC | PRN
  Filled 2017-07-28: qty 15

## 2017-07-28 MED ORDER — FENTANYL CITRATE (PF) 100 MCG/2ML IJ SOLN
INTRAMUSCULAR | Status: AC
  Filled 2017-07-28: qty 4

## 2017-07-28 NOTE — Op Note (Signed)
[facility] Patient Name: Darl PikesJim Whiteside Procedure Date: 07/28/2017 1:16 PM MRN: [id number] Account #: [id number] Date of Birth: [birth year] Admit Type: Outpatient Age: 57 [facility] PROCEDURE RM 01 Gender: Male Note Status: Finalized Attending MD: Shane CrutchPradeep Tierney Behl MD, MD Procedure:         Bronchoscopy Indications:       Left mainstem mass Providers:         Shane CrutchPradeep Handsome Anglin MD, MD Referring MD:       Medicines:         Fentanyl 125 mcg IV Complications:     No immediate complications Procedure:         Pre-Anesthesia Assessment:                    - A History and Physical has been performed. Patient meds                     and allergies have been reviewed. The risks and benefits                     of the procedure and the sedation options and risks were                     discussed with the patient. All questions were answered                     and informed consent was obtained. Patient identification                     and proposed procedure were verified prior to the                     procedure by the nurse in the pre-procedure area. Mental                     Status Examination: alert and oriented. ASA Grade                     Assessment: III - A patient with severe systemic disease.                     After reviewing the risks and benefits, the patient was                     deemed in satisfactory condition to undergo the procedure.                     The anesthesia plan was to use moderate sedation /                     analgesia (conscious sedation). Immediately prior to                     administration of medications, the patient was re-assessed                     for adequacy to receive sedatives. The heart rate,                     respiratory rate, oxygen saturations, blood pressure,                     adequacy of pulmonary ventilation, and response to care  were monitored throughout the procedure. The physical                   status of the patient was re-assessed after the procedure.                    After obtaining informed consent, the bronchoscope was                     passed under direct vision. Throughout the procedure, the                     patient's blood pressure, pulse, and oxygen saturations                     were monitored continuously. the Bronchoscope Olympus                     BF-Q180 S# [id number] was introduced through the tracheostomy                     and advanced to the tracheobronchial tree. The procedure                     was accomplished without difficulty. The patient tolerated                     the procedure well. The total duration of the procedure                     was 30 minutes. Findings:      Left Lung Abnormalities: A partially obstructing mass was found 2 cm       from the bifurcation (carina) in the left mainstem bronchus. The mass       was medium-sized. The lesion was successfully traversed. Impression:        - A mass was found in the left mainstem bronchus.                    - An endobronchial biopsy was performed.                    - Brushings were obtained.                    - Washings were obtained. Recommendation:    - Await test results. Darleen Moffitt Leonia Reeves MD, MD 07/28/2017 1:55:29 PM This report has been signed electronically. Number of Addenda: 0 Note Initiated On: 07/28/2017 1:16 PM      [facility]

## 2017-07-28 NOTE — Interval H&P Note (Signed)
History and Physical Interval Note:  07/28/2017 1:00 PM  [name]  has presented today for surgery, with the diagnosis of Regular Bronch    Lung lesion   [facility] emailed  The various methods of treatment have been discussed with the patient and family. After consideration of risks, benefits and other options for treatment, the patient has consented to  Procedure(s): FLEXIBLE BRONCHOSCOPY (N/A) as a surgical intervention .  The patient's history has been reviewed, patient examined, no change in status, stable for surgery.  I have reviewed the patient's chart and labs.  Questions were answered to the patient's satisfaction.     [name]

## 2017-07-28 NOTE — Sedation Documentation (Addendum)
Trach collar removed, [place] left in place at 4L

## 2017-07-29 ENCOUNTER — Encounter: Payer: Self-pay | Admitting: Internal Medicine

## 2017-07-30 ENCOUNTER — Ambulatory Visit: Payer: Medicare HMO | Admitting: Internal Medicine

## 2017-07-30 LAB — CYTOLOGY - NON PAP

## 2017-07-30 LAB — SURGICAL PATHOLOGY

## 2017-07-31 ENCOUNTER — Telehealth: Payer: Self-pay | Admitting: Internal Medicine

## 2017-07-31 DIAGNOSIS — IMO0002 Reserved for concepts with insufficient information to code with codable children: Secondary | ICD-10-CM

## 2017-07-31 DIAGNOSIS — R229 Localized swelling, mass and lump, unspecified: Principal | ICD-10-CM

## 2017-07-31 LAB — CULTURE, BAL-QUANTITATIVE

## 2017-07-31 LAB — CULTURE, BAL-QUANTITATIVE W GRAM STAIN: Special Requests: NORMAL

## 2017-07-31 NOTE — Telephone Encounter (Signed)
Spoke with him and gave him results which were negative and told he needs to be [facility]. [facility] for possible resection of endobronchial polyp, please call [phone] to make him an appointment and inform pt.  He needs follow up with me in about 3 months.

## 2017-07-31 NOTE — Telephone Encounter (Signed)
Pt would like bronchoscopy results. Please call.

## 2017-08-01 ENCOUNTER — Other Ambulatory Visit: Payer: Self-pay | Admitting: Internal Medicine

## 2017-08-01 DIAGNOSIS — R229 Localized swelling, mass and lump, unspecified: Principal | ICD-10-CM

## 2017-08-01 DIAGNOSIS — IMO0002 Reserved for concepts with insufficient information to code with codable children: Secondary | ICD-10-CM

## 2017-08-01 NOTE — Telephone Encounter (Signed)
Referral placed.

## 2017-08-04 ENCOUNTER — Encounter: Payer: Medicare HMO | Attending: Internal Medicine | Admitting: Physician Assistant

## 2017-08-04 ENCOUNTER — Telehealth: Payer: Self-pay | Admitting: *Deleted

## 2017-08-04 DIAGNOSIS — L97811 Non-pressure chronic ulcer of other part of right lower leg limited to breakdown of skin: Secondary | ICD-10-CM | POA: Diagnosis not present

## 2017-08-04 DIAGNOSIS — E1151 Type 2 diabetes mellitus with diabetic peripheral angiopathy without gangrene: Secondary | ICD-10-CM | POA: Diagnosis not present

## 2017-08-04 DIAGNOSIS — Z86711 Personal history of pulmonary embolism: Secondary | ICD-10-CM | POA: Diagnosis not present

## 2017-08-04 DIAGNOSIS — L97821 Non-pressure chronic ulcer of other part of left lower leg limited to breakdown of skin: Secondary | ICD-10-CM | POA: Diagnosis not present

## 2017-08-04 DIAGNOSIS — I110 Hypertensive heart disease with heart failure: Secondary | ICD-10-CM | POA: Diagnosis not present

## 2017-08-04 DIAGNOSIS — E11622 Type 2 diabetes mellitus with other skin ulcer: Secondary | ICD-10-CM | POA: Diagnosis present

## 2017-08-04 DIAGNOSIS — I482 Chronic atrial fibrillation: Secondary | ICD-10-CM | POA: Insufficient documentation

## 2017-08-04 DIAGNOSIS — Z794 Long term (current) use of insulin: Secondary | ICD-10-CM | POA: Insufficient documentation

## 2017-08-04 DIAGNOSIS — E1140 Type 2 diabetes mellitus with diabetic neuropathy, unspecified: Secondary | ICD-10-CM | POA: Insufficient documentation

## 2017-08-04 DIAGNOSIS — I890 Lymphedema, not elsewhere classified: Secondary | ICD-10-CM | POA: Diagnosis not present

## 2017-08-04 DIAGNOSIS — I5042 Chronic combined systolic (congestive) and diastolic (congestive) heart failure: Secondary | ICD-10-CM | POA: Insufficient documentation

## 2017-08-04 DIAGNOSIS — Z955 Presence of coronary angioplasty implant and graft: Secondary | ICD-10-CM | POA: Insufficient documentation

## 2017-08-04 DIAGNOSIS — Z86718 Personal history of other venous thrombosis and embolism: Secondary | ICD-10-CM | POA: Insufficient documentation

## 2017-08-04 DIAGNOSIS — I252 Old myocardial infarction: Secondary | ICD-10-CM | POA: Diagnosis not present

## 2017-08-04 DIAGNOSIS — J449 Chronic obstructive pulmonary disease, unspecified: Secondary | ICD-10-CM | POA: Insufficient documentation

## 2017-08-04 NOTE — Telephone Encounter (Signed)
Patient aware that CD needed from radiology before appointment at [facility]. He needs to go to radiology and have last 2 years of radiology put on disc. Then [facility] to: [facility]              [address], [facility] Attn: Lanora Manislizabeth              [address]              [place] [address] Carrolyn MeiersFed Ex account #: [id number] Office contact # [phone] Office fax # [phone]  Nothing further needed.

## 2017-08-05 ENCOUNTER — Inpatient Hospital Stay: Payer: Medicare HMO | Admitting: Internal Medicine

## 2017-08-05 LAB — ACID FAST SMEAR (AFB, MYCOBACTERIA): Acid Fast Smear: NEGATIVE

## 2017-08-06 ENCOUNTER — Inpatient Hospital Stay: Payer: Medicare HMO | Admitting: Internal Medicine

## 2017-08-06 NOTE — Progress Notes (Signed)
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 in [place]. Patient previously been told that he needs to as much as possible wear compression stockings. With that being said he tells me that he really does not do so. With that being said we are gonna see were things stand in one weeks time when we see him for reevaluation. He may need evaluation with vein and vascular as well. However for the time being we're gonna continue with  just a light compression wrap especially in light of its congestive heart failure which I do not want to exacerbate. Patient is in agreement with the plan. Please see above for specific wound care orders. We will see patient for re-evaluation in 1 week(s) here in the clinic. If anything worsens or changes patient will contact our office for additional recommendations. Electronic Signature(s) Signed: 08/04/2017 5:25:11 PM By: Lenda KelpStone III, Hoyt PA-C Entered By: Lenda KelpStone III, Hoyt on 08/04/2017 17:24:09 Newborn, Laren EvertsJIM P.  ([id number]) -------------------------------------------------------------------------------- ROS/PFSH Details Patient Name: [name] ClarkAHILL, Emanuelle P. Date of Service: 08/04/2017 12:30 PM Medical Record Number: [id number] Patient Account Number: [id number] Date of Birth/Sex: 07-31-1960 [age] y.o. Male) Treating RN: Renne CriglerFlinchum, Cheryl Primary Care Provider: Hillery AldoPATEL, SARAH Other Clinician: Referring Provider: Hillery AldoPATEL, SARAH Treating Provider/Extender: Linwood DibblesSTONE III, HOYT Weeks in Treatment: 0 Information Obtained From Patient Wound History Do you currently have one or more open woundso Yes How many open wounds do you currently haveo 5 Approximately how long have you had your woundso several months How have you been treating your wound(s) until nowo doxycycline for cellulitis Has your wound(s) ever healed and then re-openedo No Have you had any lab work done in the past montho No Have you tested positive for an antibiotic resistant organism (MRSA, VRE)o Yes Have you tested positive for osteomyelitis (bone infection)o No Have you had any tests for circulation on your legso Yes Who ordered the testo cone 5 years ago Eyes Complaints and Symptoms: Positive for: Glasses / Contacts - glasses Medical History: Negative for: Cataracts; Glaucoma; Optic Neuritis Ear/Nose/Mouth/Throat Complaints and Symptoms: Positive for: Sinusitis - 05/2017 Medical History: Negative for: Chronic sinus problems/congestion; Middle ear problems Hematologic/Lymphatic Complaints and Symptoms: Negative for: Bleeding / Clotting Disorders; Human Immunodeficiency Virus Medical History: Positive for: Lymphedema Negative for: Anemia; Hemophilia; Human Immunodeficiency Virus; Sickle Cell Disease Respiratory Complaints and Symptoms: Negative for: Chronic or frequent coughs; Shortness of Breath Medical History: Positive for: Sleep Apnea - cpap and ventalator for trach Negative for: Aspiration; Asthma; Chronic Obstructive Pulmonary Disease  (COPD); Pneumothorax; Tuberculosis Cardiovascular Kinkaid, Lycan P. ([id number]) Complaints and Symptoms: Positive for: Chest pain - afib 2 weeks ago; LE edema Medical History: Positive for: Arrhythmia - afib; Deep Vein Thrombosis; Hypertension; Myocardial Infarction Negative for: Angina; Congestive Heart Failure; Coronary Artery Disease; Hypotension; Peripheral Arterial Disease; Peripheral Venous Disease; Phlebitis Gastrointestinal Complaints and Symptoms: Negative for: Frequent diarrhea; Nausea; Vomiting Medical History: Negative for: Cirrhosis ; Colitis; Crohnos; Hepatitis A; Hepatitis B; Hepatitis C Endocrine Complaints and Symptoms: Negative for: Hepatitis; Thyroid disease; Polydypsia (Excessive Thirst) Medical History: Positive for: Type II Diabetes Time with diabetes: 6 years Treated with: Insulin Blood sugar tested every day: No Blood sugar testing results: Bedtime: 132 Genitourinary Complaints and Symptoms: Negative for: Kidney failure/ Dialysis; Incontinence/dribbling Medical History: Negative for: End Stage Renal Disease Psychiatric Complaints and Symptoms: Negative for: Anxiety; Claustrophobia Medical History: Negative for: Anorexia/bulimia; Confinement Anxiety Immunological Medical History: Negative for: Lupus Erythematosus; Raynaudos; Scleroderma Musculoskeletal Medical History: Negative for: Gout; Rheumatoid Arthritis; Osteoarthritis; Osteomyelitis Neurologic Medical History: Positive for: Neuropathy Heatley, Linkin P. ([id number]) Negative for: Quadriplegia; Paraplegia; Seizure Disorder Oncologic Medical History: Negative for: Received Chemotherapy; Received Radiation Immunizations Pneumococcal Vaccine: Received Pneumococcal Vaccination: Yes Implantable Devices Family and Social History Cancer: Yes - Siblings; Diabetes: No; Heart Disease: Yes - Siblings,Father,Mother,Paternal Grandparents,Maternal Grandparents; Hereditary Spherocytosis: No; Hypertension:  Yes - Child,Siblings,Father,Paternal Grandparents,Maternal Grandparents; Kidney Disease: No; Lung Disease: No; Seizures: No; Stroke: No; Thyroid Problems: No; Tuberculosis: Yes - Maternal Grandparents; Never smoker; Marital Status - Divorced; Alcohol Use:  Never; Drug Use: No History; Caffeine Use: Daily - coffee; Financial Concerns: No; Food, Clothing or Shelter Needs: No; Support System Lacking: No; Transportation Concerns: No; Advanced Directives: No; Patient does not want information on Advanced Directives; Living Will: No Electronic Signature(s) Signed: 08/04/2017 5:25:11 PM By: Lenda Kelp PA-C Signed: 08/05/2017 4:28:38 PM By: Renne Crigler Entered By: Renne Crigler on 08/04/2017 13:04:14 Lafoy, [name] ([id number]) -------------------------------------------------------------------------------- SuperBill Details Patient Name: Mclelland, Clayden P. Date of Service: 08/04/2017 Medical Record Number: [id number] Patient Account Number: [id number] Date of Birth/Sex: 1960/12/24 (57 y.o. 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 Chronic combined systolic (congestive) and diastolic (congestive) heart failure I48.2 Chronic atrial fibrillation Facility Procedures CPT4 Code: 78295621 Description: 99214 - WOUND CARE VISIT-LEV 4 EST PT Modifier: Quantity: 1 Physician Procedures CPT4 Code Description: 3086578 99214 - WC PHYS LEVEL 4 - EST PT ICD-10 Diagnosis Description I89.0 Lymphedema, not elsewhere  classified E11.622 Type 2 diabetes mellitus with other skin ulcer L97.821 Non-pressure chronic ulcer of other part of left lower  leg lim L97.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

## 2017-08-06 NOTE — Progress Notes (Signed)
JESSIAH, STEINHART ([id number]) Visit Report for 08/04/2017 Abuse/Suicide Risk Screen Details Patient Name: Capasso, ZAYDYN HAVEY. Date of Service: 08/04/2017 12:30 PM Medical Record Number: [id number] Patient Account Number: [id number] Date of Birth/Sex: [birth year] (57 y.o. Male) Treating RN: Renne Crigler Primary Care Astou Lada: Hillery Aldo Other Clinician: Referring Devontaye Ground: Hillery Aldo Treating Anayia Eugene/Extender: Linwood Dibbles, HOYT Weeks in Treatment: 0 Abuse/Suicide Risk Screen Items Answer ABUSE/SUICIDE RISK SCREEN: Has anyone close to you tried to hurt or harm you recentlyo No Do you feel uncomfortable with anyone in your familyo No Has anyone forced you do things that you didnot want to doo No Do you have any thoughts of harming yourselfo No Patient displays signs or symptoms of abuse and/or neglect. No Electronic Signature(s) Signed: 08/05/2017 4:28:38 PM By: Renne Crigler Entered By: Renne Crigler on 08/04/2017 13:04:29 Maqueda, [name] ([id number]) -------------------------------------------------------------------------------- Activities of Daily Living Details Patient Name: Oaxaca, Devonne P. Date of Service: 08/04/2017 12:30 PM Medical Record Number: [id number] Patient Account Number: [id number] Date of Birth/Sex: 10-09-1960 (57 y.o. Male) Treating RN: Renne Crigler Primary Care Caio Devera: Hillery Aldo Other Clinician: Referring Cleona Doubleday: Hillery Aldo Treating Ector Laurel/Extender: Linwood Dibbles, HOYT Weeks in Treatment: 0 Activities of Daily Living Items Answer Activities of Daily Living (Please select one for each item) Drive Automobile Not Able Take Medications Completely Able Use Telephone Completely Able Care for Appearance Completely Able Use Toilet Completely Able Bath / Shower Completely Able Dress Self Completely Able Feed Self Completely Able Walk Completely Able Get In / Out Bed Completely Able Housework Completely Able Prepare Meals Completely Able Handle Money  Completely Able Shop for Self Completely Able Electronic Signature(s) Signed: 08/05/2017 4:28:38 PM By: Renne Crigler Entered By: Renne Crigler on 08/04/2017 13:04:56 Ludlam, [name] ([id number]) -------------------------------------------------------------------------------- Education Assessment Details Patient Name: [name]. Date of Service: 08/04/2017 12:30 PM Medical Record Number: [id number] Patient Account Number: [id number] Date of Birth/Sex: [birth year] (57 y.o. Male) Treating RN: Renne Crigler Primary Care Alondra Sahni: Hillery Aldo Other Clinician: Referring Ilse Billman: Hillery Aldo Treating Marinda Tyer/Extender: Skeet Simmer in Treatment: 0 Primary Learner Assessed: Patient Learning Preferences/Education Level/Primary Language Learning Preference: Explanation Highest Education Level: College or Above Preferred Language: English Cognitive Barrier Assessment/Beliefs Language Barrier: No Translator Needed: No Memory Deficit: No Emotional Barrier: No Cultural/Religious Beliefs Affecting Medical Care: No Physical Barrier Assessment Impaired Vision: Yes Glasses Impaired Hearing: No Decreased Hand dexterity: No Knowledge/Comprehension Assessment Knowledge Level: High Comprehension Level: High Ability to understand written High instructions: Ability to understand verbal High instructions: Motivation Assessment Anxiety Level: Calm Cooperation: Cooperative Education Importance: Acknowledges Need Interest in Health Problems: Asks Questions Perception: Coherent Willingness to Engage in Self- High Management Activities: Readiness to Engage in Self- High Management Activities: Electronic Signature(s) Signed: 08/05/2017 4:28:38 PM By: Renne Crigler Entered By: Renne Crigler on 08/04/2017 13:05:23 Hartner, [name] ([id number]) -------------------------------------------------------------------------------- Fall Risk Assessment Details Patient Name:  Jarema, Khaleb P. Date of Service: 08/04/2017 12:30 PM Medical Record Number: [id number] Patient Account Number: [id number] Date of Birth/Sex: [birth year] (57 y.o. Male) Treating RN: Renne Crigler Primary Care Reneshia Zuccaro: Hillery Aldo Other Clinician: Referring Sidnie Swalley: Hillery Aldo Treating Jabarie Pop/Extender: Linwood Dibbles, HOYT Weeks in Treatment: 0 Fall Risk Assessment Items Have you had 2 or more falls in the last 12 monthso 0 Yes Have you had any fall that resulted in injury in the last 12 monthso 0 No FALL RISK ASSESSMENT: History of falling - immediate or within 3 months 25 Yes Secondary diagnosis 0 No Ambulatory aid None/bed rest/wheelchair/nurse 0 Yes Crutches/cane/walker 0 No Furniture  0 No IV Access/Saline Lock 0 No Gait/Training Normal/bed rest/immobile 0 Yes Weak 0 No Impaired 0 No Mental Status Oriented to own ability 0 Yes Electronic Signature(s) Signed: 08/05/2017 4:28:38 PM By: Renne CriglerFlinchum, Cheryl Entered By: Renne CriglerFlinchum, Cheryl on 08/04/2017 13:05:49 Hornig, Laren EvertsJIM P. ([id number]) -------------------------------------------------------------------------------- Foot Assessment Details Patient Name: Lekas, Visente P. Date of Service: 08/04/2017 12:30 PM Medical Record Number: [id number] Patient Account Number: [id number] Date of Birth/Sex: Jun 27, 1960 [age] y.o. Male) Treating RN: Renne CriglerFlinchum, Cheryl Primary Care Keean Wilmeth: Hillery AldoPATEL, SARAH Other Clinician: Referring Ellery Tash: Hillery AldoPATEL, SARAH Treating Keontre Defino/Extender: Linwood DibblesSTONE III, HOYT Weeks in Treatment: 0 Foot Assessment Items Site Locations + = Sensation present, - = Sensation absent, C = Callus, U = Ulcer R = Redness, W = Warmth, M = Maceration, PU = Pre-ulcerative lesion F = Fissure, S = Swelling, D = Dryness Assessment Right: Left: Other Deformity: No No Prior Foot Ulcer: No No Prior Amputation: No No Charcot Joint: No No Ambulatory Status: Ambulatory Without Help Gait: Steady Electronic Signature(s) Signed: 08/05/2017 4:28:38 PM  By: Renne CriglerFlinchum, Cheryl Entered By: Renne CriglerFlinchum, Cheryl on 08/04/2017 13:07:03 Quirk, Laren EvertsJIM P. ([id number]) -------------------------------------------------------------------------------- Nutrition Risk Assessment Details Patient Name: Bedore, Jusitn P. Date of Service: 08/04/2017 12:30 PM Medical Record Number: [id number] Patient Account Number: [id number] Date of Birth/Sex: Jun 27, 1960 [age] y.o. Male) Treating RN: Renne CriglerFlinchum, Cheryl Primary Care Dyanne Yorks: Hillery AldoPATEL, SARAH Other Clinician: Referring Joelynn Dust: Hillery AldoPATEL, SARAH Treating Davit Vassar/Extender: Linwood DibblesSTONE III, HOYT Weeks in Treatment: 0 Height (in): Weight (lbs): Body Mass Index (BMI): Nutrition Risk Assessment Items NUTRITION RISK SCREEN: I have an illness or condition that made me change the kind and/or amount of 0 No food I eat I eat fewer than two meals per day 0 No I eat few fruits and vegetables, or milk products 0 No I have three or more drinks of beer, liquor or wine almost every day 0 No I have tooth or mouth problems that make it hard for me to eat 0 No I don't always have enough money to buy the food I need 0 No I eat alone most of the time 0 No I take three or more different prescribed or over-the-counter drugs a day 0 No Without wanting to, I have lost or gained 10 pounds in the last six months 0 No I am not always physically able to shop, cook and/or feed myself 0 No Nutrition Protocols Good Risk Protocol 0 No interventions needed Moderate Risk Protocol Electronic Signature(s) Signed: 08/05/2017 4:28:38 PM By: Renne CriglerFlinchum, Cheryl Entered By: Renne CriglerFlinchum, Cheryl on 08/04/2017 13:06:01

## 2017-08-06 NOTE — Progress Notes (Signed)
ERVEY, FALLIN ([id number]) Visit Report for 08/04/2017 Allergy List Details Patient Name: [name], [name]. Date of Service: 08/04/2017 12:30 PM Medical Record Number: [id number] Patient Account Number: [id number] Date of Birth/Sex: 08/08/1960 (57 y.o. Male) Treating RN: Renne Crigler Primary Care Lindia Garms: Hillery Aldo Other Clinician: Referring Marshae Azam: Hillery Aldo Treating Demonte Dobratz/Extender: Linwood Dibbles, HOYT Weeks in Treatment: 0 Allergies Active Allergies PCN Severity: Severe cephalexin Severity: Severe hydrochlorothiazide Severity: Moderate hydralazine metolazone reserpine Allergy Notes Electronic Signature(s) Signed: 08/05/2017 4:28:38 PM By: Renne Crigler Entered By: Renne Crigler on 08/04/2017 12:51:42 Korber, [name] ([id number]) -------------------------------------------------------------------------------- Arrival Information Details Patient Name: [name]. Date of Service: 08/04/2017 12:30 PM Medical Record Number: [id number] Patient Account Number: [id number] Date of Birth/Sex: 01-02-1961 (57 y.o. Male) Treating RN: Renne Crigler Primary Care Holleigh Crihfield: Hillery Aldo Other Clinician: Referring Taggert Bozzi: Hillery Aldo Treating Shaw Dobek/Extender: Linwood Dibbles, HOYT Weeks in Treatment: 0 Visit Information Patient Arrived: Ambulatory Arrival Time: 12:43 Accompanied By: self Transfer Assistance: None Patient Identification Verified: Yes Secondary Verification Process Yes Completed: Patient Requires Transmission-Based No Precautions: Patient Has Alerts: Yes Patient Alerts: noncompressible bilateral History Since Last Visit Electronic Signature(s) Signed: 08/05/2017 4:28:38 PM By: Renne Crigler Entered By: Renne Crigler on 08/04/2017 13:46:06 Mitro, [name] ([id number]) -------------------------------------------------------------------------------- Clinic Level of Care Assessment Details Patient Name: [name], [name]. Date of Service: 08/04/2017  12:30 PM Medical Record Number: [id number] Patient Account Number: [id number] Date of Birth/Sex: 04-22-1961 (57 y.o. Male) Treating RN: Renne Crigler Primary Care Maud Rubendall: Hillery Aldo Other Clinician: Referring Hermon Zea: Hillery Aldo Treating Eugune Sine/Extender: Linwood Dibbles, HOYT Weeks in Treatment: 0 Clinic Level of Care Assessment Items TOOL 2 Quantity Score X - Use when only an EandM is performed on the INITIAL visit 1 0 ASSESSMENTS - Nursing Assessment / Reassessment X - General Physical Exam (combine w/ comprehensive assessment (listed just below) when 1 20 performed on new pt. evals) X- 1 25 Comprehensive Assessment (HX, ROS, Risk Assessments, Wounds Hx, etc.) ASSESSMENTS - Wound and Skin Assessment / Reassessment []  - Simple Wound Assessment / Reassessment - one wound 0 X- 2 5 Complex Wound Assessment / Reassessment - multiple wounds []  - 0 Dermatologic / Skin Assessment (not related to wound area) ASSESSMENTS - Ostomy and/or Continence Assessment and Care []  - Incontinence Assessment and Management 0 []  - 0 Ostomy Care Assessment and Management (repouching, etc.) PROCESS - Coordination of Care []  - Simple Patient / Family Education for ongoing care 0 X- 1 20 Complex (extensive) Patient / Family Education for ongoing care []  - 0 Staff obtains Chiropractor, Records, Test Results / Process Orders []  - 0 Staff telephones HHA, Nursing Homes / Clarify orders / etc []  - 0 Routine Transfer to another Facility (non-emergent condition) []  - 0 Routine Hospital Admission (non-emergent condition) []  - 0 New Admissions / Manufacturing engineer / Ordering NPWT, Apligraf, etc. []  - 0 Emergency Hospital Admission (emergent condition) []  - 0 Simple Discharge Coordination X- 1 15 Complex (extensive) Discharge Coordination PROCESS - Special Needs []  - Pediatric / Minor Patient Management 0 []  - 0 Isolation Patient Management Reaney, Alvia P. ([id number]) []  - 0 Hearing / Language  / Visual special needs []  - 0 Assessment of Community assistance (transportation, D/C planning, etc.) []  - 0 Additional assistance / Altered mentation []  - 0 Support Surface(s) Assessment (bed, cushion, seat, etc.) INTERVENTIONS - Wound Cleansing / Measurement X - Wound Imaging (photographs - any number of wounds) 1 5 []  - 0 Wound Tracing (instead of photographs) []  - 0 Simple Wound Measurement -  one wound X- 2 5 Complex Wound Measurement - multiple wounds []  - 0 Simple Wound Cleansing - one wound []  - 0 Complex Wound Cleansing - multiple wounds INTERVENTIONS - Wound Dressings []  - Small Wound Dressing one or multiple wounds 0 X- 2 15 Medium Wound Dressing one or multiple wounds []  - 0 Large Wound Dressing one or multiple wounds []  - 0 Application of Medications - injection INTERVENTIONS - Miscellaneous []  - External ear exam 0 []  - 0 Specimen Collection (cultures, biopsies, blood, body fluids, etc.) []  - 0 Specimen(s) / Culture(s) sent or taken to Lab for analysis []  - 0 Patient Transfer (multiple staff / Nurse, adult / Similar devices) []  - 0 Simple Staple / Suture removal (25 or less) []  - 0 Complex Staple / Suture removal (26 or more) []  - 0 Hypo / Hyperglycemic Management (close monitor of Blood Glucose) X- 1 15 Ankle / Brachial Index (ABI) - do not check if billed separately Has the patient been seen at the hospital within the last three years: Yes Total Score: 150 Level Of Care: New/Established - Level 4 Electronic Signature(s) Signed: 08/05/2017 4:28:38 PM By: Renne Crigler Entered By: Renne Crigler on 08/04/2017 14:16:52 Mcloughlin, [name] ([id number]) -------------------------------------------------------------------------------- Encounter Discharge Information Details Patient Name: [name] Pikes P. Date of Service: 08/04/2017 12:30 PM Medical Record Number: [id number] Patient Account Number: [id number] Date of Birth/Sex: September 09, 1960 (57 y.o. Male) Treating  RN: Renne Crigler Primary Care Makinzy Cleere: Hillery Aldo Other Clinician: Referring Sohil Timko: Hillery Aldo Treating Arnoldo Hildreth/Extender: Linwood Dibbles, HOYT Weeks in Treatment: 0 Encounter Discharge Information Items Discharge Pain Level: 0 Discharge Condition: Stable Ambulatory Status: Ambulatory Discharge Destination: Home Private Transportation: Auto Accompanied By: self Schedule Follow-up Appointment: Yes Medication Reconciliation completed and provided No to Patient/Care Simaya Lumadue: Clinical Summary of Care: Electronic Signature(s) Signed: 08/05/2017 4:28:38 PM By: Renne Crigler Entered By: Renne Crigler on 08/04/2017 14:18:24 Drewry, [name] ([id number]) -------------------------------------------------------------------------------- Lower Extremity Assessment Details Patient Name: [name], [name] Date of Service: 08/04/2017 12:30 PM Medical Record Number: [id number] Patient Account Number: [id number] Date of Birth/Sex: 04-22-1961 (57 y.o. Male) Treating RN: Renne Crigler Primary Care Obe Ahlers: Hillery Aldo Other Clinician: Referring Sabrine Patchen: Hillery Aldo Treating Joscelynn Brutus/Extender: Linwood Dibbles, HOYT Weeks in Treatment: 0 Vascular Assessment Pulses: Posterior Tibial Extremity colors, hair growth, and conditions: Extremity Color: [Left:Pale] [Right:Red] Hair Growth on Extremity: [Left:Yes] [Right:Yes] Temperature of Extremity: [Left:Cool] [Right:Cool] Capillary Refill: [Left:> 3 seconds] [Right:> 3 seconds] Blood Pressure: Dorsalis Pedis: 168 [Left:Dorsalis Pedis: 200] Ankle: Posterior Tibial: 200 [Left:Posterior Tibial: 200] Toe Nail Assessment Left: Right: Thick: Yes Yes Discolored: Yes Yes Deformed: Yes Yes Improper Length and Hygiene: Yes Yes Electronic Signature(s) Signed: 08/05/2017 4:28:38 PM By: Renne Crigler Entered By: Renne Crigler on 08/04/2017 13:38:07 Buster, [name]  ([id number]) -------------------------------------------------------------------------------- Multi Wound Chart Details Patient Name: [name], [name] Date of Service: 08/04/2017 12:30 PM Medical Record Number: [id number] Patient Account Number: [id number] Date of Birth/Sex: December 29, 1960 (57 y.o. Male) Treating RN: Renne Crigler Primary Care Jamiee Milholland: Hillery Aldo Other Clinician: Referring [place] Oborn: Hillery Aldo Treating Kamarie Veno/Extender: Linwood Dibbles, HOYT Weeks in Treatment: 0 Vital Signs Height(in): 74 Pulse(bpm): 67 Weight(lbs): 310 Blood Pressure(mmHg): 136/87 Body Mass Index(BMI): 40 Temperature(F): 98.2 Respiratory Rate 18 (breaths/min): Photos: [11:No Photos] [12:No Photos] [13:No Photos] Wound Location: [11:Left Lower Leg] [12:Left Lower Leg - Medial] [13:Right Lower Leg - Medial, Circumfernential] Wounding Event: [11:Gradually Appeared] [12:Gradually Appeared] [13:Gradually Appeared] Primary Etiology: [11:Lymphedema] [12:Lymphedema] [13:Lymphedema] Comorbid History: [11:Lymphedema, Sleep Apnea, Lymphedema, Sleep Apnea, Lymphedema, Sleep Apnea, Arrhythmia, Deep Vein Thrombosis, Hypertension, Myocardial  Infarction, Type II Myocardial Infarction, Type II Myocardial Infarction, Type II Diabetes,  Neuropathy] [12:Arrhythmia, Deep Vein Thrombosis, Hypertension, Diabetes, Neuropathy] [13:Arrhythmia, Deep Vein Thrombosis, Hypertension, Diabetes, Neuropathy] Date Acquired: [11:06/23/2017] [12:07/21/2017] [13:07/21/2017] Weeks of Treatment: [11:0] [12:0] [13:0] Wound Status: [11:Open] [12:Open] [13:Open] Clustered Wound: [11:Yes] [12:No] [13:Yes] Measurements L x W x D [11:2.8x3.5x0.1] [12:5.5x5x0.1] [13:8x5x0.1] (cm) Area (cm) : [11:7.697] [12:21.598] [13:31.416] Volume (cm) : [11:0.77] [12:2.16] [13:3.142] Classification: [11:Partial Thickness] [12:Partial Thickness] [13:Partial Thickness] Exudate Amount: [11:Large] [12:Large] [13:Large] Exudate Type: [11:Serous] [12:Serous]  [13:Serous] Exudate Color: [11:amber] [12:amber] [13:amber] Wound Margin: [11:Flat and Intact] [12:Flat and Intact] [13:Flat and Intact] Granulation Amount: [11:Large (67-100%)] [12:Large (67-100%)] [13:Large (67-100%)] Granulation Quality: [11:Red] [12:Red] [13:N/A] Necrotic Amount: [11:Small (1-33%)] [12:Small (1-33%)] [13:Small (1-33%)] Exposed Structures: [11:Fat Layer (Subcutaneous Tissue) Exposed: Yes] [12:Fascia: No Fat Layer (Subcutaneous Tissue) Exposed: No Tendon: No Muscle: No Joint: No Bone: No] [13:Fascia: No Fat Layer (Subcutaneous Tissue) Exposed: No Tendon: No Muscle: No Joint: No Bone: No] Epithelialization: [11:None] [12:None] [13:None] Periwound Skin Texture: [11:Excoriation: Yes Induration: No Callus: No] [12:Excoriation: Yes Induration: No Callus: No] [13:Excoriation: No Induration: No Callus: No] Crepitus: No Crepitus: No Crepitus: No Rash: No Rash: No Rash: No Scarring: No Scarring: No Scarring: No Periwound Skin Moisture: Maceration: No Maceration: No Maceration: No Dry/Scaly: No Dry/Scaly: No Dry/Scaly: No Periwound Skin Color: Atrophie Blanche: No Erythema: Yes Erythema: Yes Cyanosis: No Atrophie Blanche: No Atrophie Blanche: No Ecchymosis: No Cyanosis: No Cyanosis: No Erythema: No Ecchymosis: No Ecchymosis: No Hemosiderin Staining: No Hemosiderin Staining: No Hemosiderin Staining: No Mottled: No Mottled: No Mottled: No Pallor: No Pallor: No Pallor: No Rubor: No Rubor: No Rubor: No Erythema Location: N/A Circumferential Circumferential Temperature: No Abnormality No Abnormality N/A Tenderness on Palpation: No No No Wound Preparation: Ulcer Cleansing: Ulcer Cleansing: Ulcer Cleansing: Rinsed/Irrigated with Saline Rinsed/Irrigated with Saline Rinsed/Irrigated with Saline Topical Anesthetic Applied: Topical Anesthetic Applied: Topical Anesthetic Applied: Other: lidocaine 4 % Other: lidocaine 4% Other: lidocaine 4% Treatment  Notes Electronic Signature(s) Signed: 08/05/2017 4:28:38 PM By: Renne Crigler Entered By: Renne Crigler on 08/04/2017 13:49:03 Ritthaler, [name] ([id number]) -------------------------------------------------------------------------------- Multi-Disciplinary Care Plan Details Patient Name: Adamec, Koleton P. Date of Service: 08/04/2017 12:30 PM Medical Record Number: [id number] Patient Account Number: [id number] Date of Birth/Sex: [birth year] (57 y.o. Male) Treating RN: Renne Crigler Primary Care Aunisty Reali: Hillery Aldo Other Clinician: Referring Dean Goldner: Hillery Aldo Treating Caroll Weinheimer/Extender: Linwood Dibbles, HOYT Weeks in Treatment: 0 Active Inactive ` Orientation to the Wound Care Program Nursing Diagnoses: Knowledge deficit related to the wound healing center program Goals: Patient/caregiver will verbalize understanding of the Wound Healing Center Program Date Initiated: 08/04/2017 Target Resolution Date: 08/25/2017 Goal Status: Active Interventions: Provide education on orientation to the wound center Notes: ` Peripheral Neuropathy Nursing Diagnoses: Knowledge deficit related to disease process and management of peripheral neurovascular dysfunction Goals: Patient/caregiver will verbalize understanding of disease process and disease management Date Initiated: 08/04/2017 Target Resolution Date: 08/25/2017 Goal Status: Active Interventions: Assess signs and symptoms of neuropathy upon admission and as needed Notes: ` Wound/Skin Impairment Nursing Diagnoses: Impaired tissue integrity Goals: Patient/caregiver will verbalize understanding of skin care regimen Date Initiated: 08/04/2017 Target Resolution Date: 08/25/2017 Goal Status: Active Ulcer/skin breakdown will have a volume reduction of 30% by week 4 Date Initiated: 08/04/2017 Target Resolution Date: 08/25/2017 GLEB, MCGUIRE ([id number]) Goal Status: Active Interventions: Assess patient/caregiver ability to obtain necessary  supplies Assess patient/caregiver ability to perform ulcer/skin care regimen upon admission and as needed Assess ulceration(s) every visit Treatment Activities: Skin care regimen initiated :  08/04/2017 Notes: Electronic Signature(s) Signed: 08/05/2017 4:28:38 PM By: Renne CriglerFlinchum, Cheryl Entered By: Renne CriglerFlinchum, Cheryl on 08/04/2017 13:48:44 Michalski, Laren EvertsJIM P. ([id number]) -------------------------------------------------------------------------------- Pain Assessment Details Patient Name: Dinkins, Wiliam P. Date of Service: 08/04/2017 12:30 PM Medical Record Number: [id number] Patient Account Number: [id number] Date of Birth/Sex: 10-25-1960 2(56 y.o. Male) Treating RN: Renne CriglerFlinchum, Cheryl Primary Care Shiva Karis: Hillery AldoPATEL, SARAH Other Clinician: Referring Dick Hark: Hillery AldoPATEL, SARAH Treating Keywon Mestre/Extender: Linwood DibblesSTONE III, HOYT Weeks in Treatment: 0 Active Problems Location of Pain Severity and Description of Pain Patient Has Paino No Site Locations Pain Management and Medication Current Pain Management: Notes patient has neuropathy Electronic Signature(s) Signed: 08/05/2017 4:28:38 PM By: Renne CriglerFlinchum, Cheryl Entered By: Renne CriglerFlinchum, Cheryl on 08/04/2017 12:49:10 Cadotte, Laren EvertsJIM P. ([id number]) -------------------------------------------------------------------------------- Patient/Caregiver Education Details Patient Name: [name] ClarkAHILL, Taishawn P. Date of Service: 08/04/2017 12:30 PM Medical Record Number: [id number] Patient Account Number: [id number] Date of Birth/Gender: 10-25-1960 [age] y.o. Male) Treating RN: Renne CriglerFlinchum, Cheryl Primary Care Physician: Hillery AldoPATEL, SARAH Other Clinician: Referring Physician: Hillery AldoPATEL, SARAH Treating Physician/Extender: Skeet SimmerSTONE III, HOYT Weeks in Treatment: 0 Education Assessment Education Provided To: Patient Education Topics Provided Wound/Skin Impairment: Handouts: Caring for Your Ulcer Methods: Explain/Verbal Responses: State content correctly Electronic Signature(s) Signed: 08/05/2017 4:28:38 PM By:  Renne CriglerFlinchum, Cheryl Entered By: Renne CriglerFlinchum, Cheryl on 08/04/2017 14:18:41 Batchelder, Laren EvertsJIM P. ([id number]) -------------------------------------------------------------------------------- Wound Assessment Details Patient Name: [name], [name] Date of Service: 08/04/2017 12:30 PM Medical Record Number: [id number] Patient Account Number: [id number] Date of Birth/Sex: 10-25-1960 4(56 y.o. Male) Treating RN: Renne CriglerFlinchum, Cheryl Primary Care Rochella Benner: Hillery AldoPATEL, SARAH Other Clinician: Referring Fender Herder: Hillery AldoPATEL, SARAH Treating Demarrius Guerrero/Extender: Linwood DibblesSTONE III, HOYT Weeks in Treatment: 0 Wound Status Wound Number: 11 Primary Lymphedema Etiology: Wound Location: Left Lower Leg Wound Open Wounding Event: Gradually Appeared Status: Date Acquired: 06/23/2017 Comorbid Lymphedema, Sleep Apnea, Arrhythmia, Deep Weeks Of Treatment: 0 History: Vein Thrombosis, Hypertension, Myocardial Clustered Wound: Yes Infarction, Type II Diabetes, Neuropathy Photos Photo Uploaded By: Elliot GurneyWoody, BSN, RN, CWS, Kim on 08/05/2017 07:46:33 Wound Measurements Length: (cm) 2.8 Width: (cm) 3.5 Depth: (cm) 0.1 Area: (cm) 7.697 Volume: (cm) 0.77 % Reduction in Area: % Reduction in Volume: Epithelialization: None Tunneling: No Undermining: No Wound Description Classification: Partial Thickness Foul Odor Wound Margin: Flat and Intact Slough/Fi Exudate Amount: Large Exudate Type: Serous Exudate Color: amber After Cleansing: No brino No Wound Bed Granulation Amount: Large (67-100%) Exposed Structure Granulation Quality: Red Fat Layer (Subcutaneous Tissue) Exposed: Yes Necrotic Amount: Small (1-33%) Necrotic Quality: Adherent Slough Periwound Skin Texture Texture Color No Abnormalities Noted: No No Abnormalities Noted: No Callus: No Atrophie Blanche: No Hooper, Trestan P. ([id number]) Crepitus: No Cyanosis: No Excoriation: Yes Ecchymosis: No Induration: No Erythema: No Rash: No Hemosiderin Staining: No Scarring:  No Mottled: No Pallor: No Moisture Rubor: No No Abnormalities Noted: No Dry / Scaly: No Temperature / Pain Maceration: No Temperature: No Abnormality Wound Preparation Ulcer Cleansing: Rinsed/Irrigated with Saline Topical Anesthetic Applied: Other: lidocaine 4 %, Treatment Notes Wound #11 (Left Lower Leg) 1. Cleansed with: Clean wound with Normal Saline 2. Anesthetic Topical Lidocaine 4% cream to wound bed prior to debridement 4. Dressing Applied: Other dressing (specify in notes) 7. Secured with 2 Layer Compression System - Bilateral Notes kerlix and coban Electronic Signature(s) Signed: 08/05/2017 4:28:38 PM By: Renne CriglerFlinchum, Cheryl Entered By: Renne CriglerFlinchum, Cheryl on 08/04/2017 13:19:19 Appleton, Laren EvertsJIM P. ([id number]) -------------------------------------------------------------------------------- Wound Assessment Details Patient Name: [name], [name] Date of Service: 08/04/2017 12:30 PM Medical Record Number: [id number] Patient Account Number: [id number] Date of Birth/Sex: 10-25-1960 [age] y.o. Male) Treating RN: Renne CriglerFlinchum, Cheryl Primary Care Kamarri Fischetti: Allena KatzPATEL,  SARAH Other Clinician: Referring Jakeob Tullis: Hillery Aldo Treating Kennth Vanbenschoten/Extender: STONE III, HOYT Weeks in Treatment: 0 Wound Status Wound Number: 12 Primary Lymphedema Etiology: Wound Location: Left Lower Leg - Medial Wound Open Wounding Event: Gradually Appeared Status: Date Acquired: 07/21/2017 Comorbid Lymphedema, Sleep Apnea, Arrhythmia, Deep Weeks Of Treatment: 0 History: Vein Thrombosis, Hypertension, Myocardial Clustered Wound: No Infarction, Type II Diabetes, Neuropathy Photos Photo Uploaded By: Elliot Gurney, BSN, RN, CWS, Kim on 08/05/2017 07:46:33 Wound Measurements Length: (cm) 5.5 Width: (cm) 5 Depth: (cm) 0.1 Area: (cm) 21.598 Volume: (cm) 2.16 % Reduction in Area: % Reduction in Volume: Epithelialization: None Tunneling: No Undermining: No Wound Description Classification: Partial Thickness Foul  Odor Wound Margin: Flat and Intact Slough/Fi Exudate Amount: Large Exudate Type: Serous Exudate Color: amber After Cleansing: No brino No Wound Bed Granulation Amount: Large (67-100%) Exposed Structure Granulation Quality: Red Fascia Exposed: No Necrotic Amount: Small (1-33%) Fat Layer (Subcutaneous Tissue) Exposed: No Necrotic Quality: Adherent Slough Tendon Exposed: No Muscle Exposed: No Joint Exposed: No Bone Exposed: No Periwound Skin Texture Stockinger, Elric P. ([id number]) Texture Color No Abnormalities Noted: No No Abnormalities Noted: No Callus: No Atrophie Blanche: No Crepitus: No Cyanosis: No Excoriation: Yes Ecchymosis: No Induration: No Erythema: Yes Rash: No Erythema Location: Circumferential Scarring: No Hemosiderin Staining: No Mottled: No Moisture Pallor: No No Abnormalities Noted: No Rubor: No Dry / Scaly: No Maceration: No Temperature / Pain Temperature: No Abnormality Wound Preparation Ulcer Cleansing: Rinsed/Irrigated with Saline Topical Anesthetic Applied: Other: lidocaine 4%, Treatment Notes Wound #12 (Left, Medial Lower Leg) 1. Cleansed with: Clean wound with Normal Saline 2. Anesthetic Topical Lidocaine 4% cream to wound bed prior to debridement 4. Dressing Applied: Other dressing (specify in notes) 7. Secured with 2 Layer Compression System - Bilateral Notes kerlix and coban Electronic Signature(s) Signed: 08/05/2017 4:28:38 PM By: Renne Crigler Entered By: Renne Crigler on 08/04/2017 13:22:24 Busta, Bearl P. ([id number]) -------------------------------------------------------------------------------- Wound Assessment Details Patient Name: [name], [name] Date of Service: 08/04/2017 12:30 PM Medical Record Number: [id number] Patient Account Number: [id number] Date of Birth/Sex: 20-May-1961 (57 y.o. Male) Treating RN: Renne Crigler Primary Care Johngabriel Verde: Hillery Aldo Other Clinician: Referring Porschea Borys: Hillery Aldo Treating  Paticia Moster/Extender: Linwood Dibbles, HOYT Weeks in Treatment: 0 Wound Status Wound Number: 13 Primary Lymphedema Etiology: Wound Location: Right Lower Leg - Medial, Circumfernential Wound Open Status: Wounding Event: Gradually Appeared Comorbid Lymphedema, Sleep Apnea, Arrhythmia, Deep Date Acquired: 07/21/2017 History: Vein Thrombosis, Hypertension, Myocardial Weeks Of Treatment: 0 Infarction, Type II Diabetes, Neuropathy Clustered Wound: Yes Photos Photo Uploaded By: Elliot Gurney, BSN, RN, CWS, Kim on 08/05/2017 07:47:26 Wound Measurements Length: (cm) 8 Width: (cm) 5 Depth: (cm) 0.1 Area: (cm) 31.416 Volume: (cm) 3.142 % Reduction in Area: % Reduction in Volume: Epithelialization: None Tunneling: No Undermining: No Wound Description Classification: Partial Thickness Foul Odor Wound Margin: Flat and Intact Slough/Fi Exudate Amount: Large Exudate Type: Serous Exudate Color: amber After Cleansing: No brino Yes Wound Bed Granulation Amount: Large (67-100%) Exposed Structure Necrotic Amount: Small (1-33%) Fascia Exposed: No Necrotic Quality: Adherent Slough Fat Layer (Subcutaneous Tissue) Exposed: No Tendon Exposed: No Muscle Exposed: No Joint Exposed: No Bone Exposed: No Periwound Skin Texture [name], [name] ([id number]) Texture Color No Abnormalities Noted: No No Abnormalities Noted: No Callus: No Atrophie Blanche: No Crepitus: No Cyanosis: No Excoriation: No Ecchymosis: No Induration: No Erythema: Yes Rash: No Erythema Location: Circumferential Scarring: No Hemosiderin Staining: No Mottled: No Moisture Pallor: No No Abnormalities Noted: No Rubor: No Dry / Scaly: No Maceration: No Wound Preparation Ulcer Cleansing:  Rinsed/Irrigated with Saline Topical Anesthetic Applied: Other: lidocaine 4%, Treatment Notes Wound #13 (Right, Medial, Circumferential Lower Leg) 1. Cleansed with: Clean wound with Normal Saline 2. Anesthetic Topical Lidocaine 4% cream to  wound bed prior to debridement 4. Dressing Applied: Other dressing (specify in notes) 7. Secured with 2 Layer Compression System - Bilateral Notes kerlix and coban Electronic Signature(s) Signed: 08/05/2017 4:28:38 PM By: Renne Crigler Entered By: Renne Crigler on 08/04/2017 13:24:36 Goerner, [name] ([id number]) -------------------------------------------------------------------------------- Vitals Details Patient Name: [name], [name] Date of Service: 08/04/2017 12:30 PM Medical Record Number: [id number] Patient Account Number: [id number] Date of Birth/Sex: 02-19-1961 (57 y.o. Male) Treating RN: Renne Crigler Primary Care Tyresse Jayson: Hillery Aldo Other Clinician: Referring Shaketa Serafin: Hillery Aldo Treating Sayde Lish/Extender: Linwood Dibbles, HOYT Weeks in Treatment: 0 Vital Signs Time Taken: 12:49 Temperature (F): 98.2 Height (in): 74 Pulse (bpm): 67 Source: Stated Respiratory Rate (breaths/min): 18 Weight (lbs): 310 Blood Pressure (mmHg): 136/87 Source: Measured Reference Range: 80 - 120 mg / dl Body Mass Index (BMI): 39.8 Electronic Signature(s) Signed: 08/05/2017 4:28:38 PM By: Renne Crigler Entered By: Renne Crigler on 08/04/2017 13:45:17

## 2017-08-07 DIAGNOSIS — E11622 Type 2 diabetes mellitus with other skin ulcer: Secondary | ICD-10-CM | POA: Diagnosis not present

## 2017-08-08 NOTE — Progress Notes (Addendum)
[name] ([id number]) Visit Report for 08/07/2017 Arrival Information Details Patient Name: [name], [name]. Date of Service: 08/07/2017 1:30 PM Medical Record Number: [id number] Patient Account Number: [id number] Date of Birth/Sex: 04/02/1961 (57 y.o. Male) Treating RN: Huel Coventry Primary Care Alyssa Rotondo: Hillery Aldo Other Clinician: Referring Zamia Tyminski: Hillery Aldo Treating Aaliyah Gavel/Extender: Kathreen Cosier in Treatment: 0 Visit Information History Since Last Visit Added or deleted any medications: No Patient Arrived: Ambulatory Any new allergies or adverse reactions: No Arrival Time: 13:41 Had a fall or experienced change in No Accompanied By: self activities of daily living that may affect Transfer Assistance: None risk of falls: Patient Identification Verified: Yes Signs or symptoms of abuse/neglect since last visito No Secondary Verification Process Yes Hospitalized since last visit: No Completed: Has Dressing in Place as Prescribed: Yes Patient Requires Transmission- No Has Compression in Place as Prescribed: Yes Based Precautions: Pain Present Now: No Patient Has Alerts: Yes Patient Alerts: noncompressible bilateral Electronic Signature(s) Signed: 08/07/2017 2:33:44 PM By: Elliot Gurney, BSN, RN, CWS, Kim RN, BSN Entered By: Elliot Gurney, BSN, RN, CWS, Kim on 08/07/2017 14:33:43 Mancusi, [name] ([id number]) -------------------------------------------------------------------------------- Clinic Level of Care Assessment Details Patient Name: Bassin, Randon P. Date of Service: 08/07/2017 1:30 PM Medical Record Number: [id number] Patient Account Number: [id number] Date of Birth/Sex: 1960-10-18 (57 y.o. Male) Treating RN: Huel Coventry Primary Care Ennifer Harston: Hillery Aldo Other Clinician: Referring Marylynn Rigdon: Hillery Aldo Treating Sharif Rendell/Extender: Kathreen Cosier in Treatment: 0 Clinic Level of Care Assessment Items TOOL 4 Quantity Score []  - Use when only an EandM is performed on  FOLLOW-UP visit 0 ASSESSMENTS - Nursing Assessment / Reassessment []  - Reassessment of Co-morbidities (includes updates in patient status) 0 X- 1 5 Reassessment of Adherence to Treatment Plan ASSESSMENTS - Wound and Skin Assessment / Reassessment []  - Simple Wound Assessment / Reassessment - one wound 0 []  - 0 Complex Wound Assessment / Reassessment - multiple wounds []  - 0 Dermatologic / Skin Assessment (not related to wound area) ASSESSMENTS - Focused Assessment []  - Circumferential Edema Measurements - multi extremities 0 []  - 0 Nutritional Assessment / Counseling / Intervention []  - 0 Lower Extremity Assessment (monofilament, tuning fork, pulses) []  - 0 Peripheral Arterial Disease Assessment (using hand held doppler) ASSESSMENTS - Ostomy and/or Continence Assessment and Care []  - Incontinence Assessment and Management 0 []  - 0 Ostomy Care Assessment and Management (repouching, etc.) PROCESS - Coordination of Care X - Simple Patient / Family Education for ongoing care 1 15 []  - 0 Complex (extensive) Patient / Family Education for ongoing care []  - 0 Staff obtains Chiropractor, Records, Test Results / Process Orders []  - 0 Staff telephones HHA, Nursing Homes / Clarify orders / etc []  - 0 Routine Transfer to another Facility (non-emergent condition) []  - 0 Routine Hospital Admission (non-emergent condition) []  - 0 New Admissions / Manufacturing engineer / Ordering NPWT, Apligraf, etc. []  - 0 Emergency Hospital Admission (emergent condition) X- 1 10 Simple Discharge Coordination Nelligan, Murice P. ([id number]) []  - 0 Complex (extensive) Discharge Coordination PROCESS - Special Needs []  - Pediatric / Minor Patient Management 0 []  - 0 Isolation Patient Management []  - 0 Hearing / Language / Visual special needs []  - 0 Assessment of Community assistance (transportation, D/C planning, etc.) []  - 0 Additional assistance / Altered mentation []  - 0 Support Surface(s)  Assessment (bed, cushion, seat, etc.) INTERVENTIONS - Wound Cleansing / Measurement []  - Simple Wound Cleansing - one wound 0 X- 3 5 Complex Wound Cleansing - multiple wounds []  -  0 Wound Imaging (photographs - any number of wounds) []  - 0 Wound Tracing (instead of photographs) []  - 0 Simple Wound Measurement - one wound X- 3 5 Complex Wound Measurement - multiple wounds INTERVENTIONS - Wound Dressings []  - Small Wound Dressing one or multiple wounds 0 []  - 0 Medium Wound Dressing one or multiple wounds X- 2 20 Large Wound Dressing one or multiple wounds []  - 0 Application of Medications - topical []  - 0 Application of Medications - injection INTERVENTIONS - Miscellaneous []  - External ear exam 0 []  - 0 Specimen Collection (cultures, biopsies, blood, body fluids, etc.) []  - 0 Specimen(s) / Culture(s) sent or taken to Lab for analysis []  - 0 Patient Transfer (multiple staff / Nurse, adultHoyer Lift / Similar devices) []  - 0 Simple Staple / Suture removal (25 or less) []  - 0 Complex Staple / Suture removal (26 or more) []  - 0 Hypo / Hyperglycemic Management (close monitor of Blood Glucose) []  - 0 Ankle / Brachial Index (ABI) - do not check if billed separately X- 1 5 Vital Signs Keetch, Niall P. ([id number]) Has the patient been seen at the hospital within the last three years: Yes Total Score: 105 Level Of Care: New/Established - Level 3 Electronic Signature(s) Signed: 08/08/2017 5:24:10 PM By: Elliot GurneyWoody, BSN, RN, CWS, Kim RN, BSN Entered By: Elliot GurneyWoody, BSN, RN, CWS, Kim on 08/07/2017 14:39:04 Sluder, Laren EvertsJIM P. ([id number]) -------------------------------------------------------------------------------- Encounter Discharge Information Details Patient Name: [name], [name] Date of Service: 08/07/2017 1:30 PM Medical Record Number: [id number] Patient Account Number: [id number] Date of Birth/Sex: 1960-11-20 32(56 y.o. Male) Treating RN: Huel CoventryWoody, Kim Primary Care Tyquez Hollibaugh: Hillery AldoPATEL, SARAH Other  Clinician: Referring Cherese Lozano: Hillery AldoPATEL, SARAH Treating Jameka Ivie/Extender: Kathreen Cosieroulter, Leah Weeks in Treatment: 0 Encounter Discharge Information Items Discharge Pain Level: 0 Discharge Condition: Stable Ambulatory Status: Ambulatory Discharge Destination: Home Private Transportation: Auto Accompanied By: self Schedule Follow-up Appointment: Yes Medication Reconciliation completed and No provided to Patient/Care Ryah Cribb: Clinical Summary of Care: Electronic Signature(s) Signed: 08/07/2017 2:38:13 PM By: Elliot GurneyWoody, BSN, RN, CWS, Kim RN, BSN Entered By: Elliot GurneyWoody, BSN, RN, CWS, Kim on 08/07/2017 14:38:13 Goulart, Laren EvertsJIM P. ([id number]) -------------------------------------------------------------------------------- Patient/Caregiver Education Details Patient Name: Hilliard ClarkAHILL, Ernest P. Date of Service: 08/07/2017 1:30 PM Medical Record Number: [id number] Patient Account Number: [id number] Date of Birth/Gender: 1960-11-20 [age] y.o. Male) Treating RN: Huel CoventryWoody, Kim Primary Care Physician: Hillery AldoPATEL, SARAH Other Clinician: Referring Physician: Hillery AldoPATEL, SARAH Treating Physician/Extender: Kathreen Cosieroulter, Leah Weeks in Treatment: 0 Education Assessment Education Provided To: Patient Education Topics Provided Venous: Wound/Skin Impairment: Handouts: Caring for Your Ulcer Methods: Demonstration, Explain/Verbal Responses: State content correctly Electronic Signature(s) Signed: 08/08/2017 5:24:10 PM By: Elliot GurneyWoody, BSN, RN, CWS, Kim RN, BSN Entered By: Elliot GurneyWoody, BSN, RN, CWS, Kim on 08/07/2017 14:37:56 Lorimer, Laren EvertsJIM P. ([id number]) -------------------------------------------------------------------------------- Wound Assessment Details Patient Name: Dedrick, Codey P. Date of Service: 08/07/2017 1:30 PM Medical Record Number: [id number] Patient Account Number: [id number] Date of Birth/Sex: 1960-11-20 19(56 y.o. Male) Treating RN: Huel CoventryWoody, Kim Primary Care Jesua Tamblyn: Hillery AldoPATEL, SARAH Other Clinician: Referring Ovide Dusek: Hillery AldoPATEL, SARAH Treating  Vienna Folden/Extender: Kathreen Cosieroulter, Leah Weeks in Treatment: 0 Wound Status Wound Number: 11 Primary Etiology: Lymphedema Wound Location: Left Lower Leg Wound Status: Open Wounding Event: Gradually Appeared Date Acquired: 06/23/2017 Weeks Of Treatment: 0 Clustered Wound: Yes Wound Measurements Length: (cm) 2.8 Width: (cm) 3.5 Depth: (cm) 0.1 Area: (cm) 7.697 Volume: (cm) 0.77 % Reduction in Area: 0% % Reduction in Volume: 0% Wound Description Classification: Partial Thickness Periwound Skin Texture Texture Color No Abnormalities Noted: No No Abnormalities Noted: No Moisture No  Abnormalities Noted: No Treatment Notes Wound #11 (Left Lower Leg) 1. Cleansed with: Clean wound with Normal Saline 2. Anesthetic Topical Lidocaine 4% cream to wound bed prior to debridement 4. Dressing Applied: Other dressing (specify in notes) Notes kerlix and Theatre manager) Signed: 08/08/2017 5:24:10 PM By: Elliot Gurney, BSN, RN, CWS, Kim RN, BSN Entered By: Elliot Gurney, BSN, RN, CWS, Kim on 08/07/2017 14:36:41 Jolliff, [name] ([id number]) -------------------------------------------------------------------------------- Wound Assessment Details Patient Name: Montilla, Benjimin P. Date of Service: 08/07/2017 1:30 PM Medical Record Number: [id number] Patient Account Number: [id number] Date of Birth/Sex: [birth year] (57 y.o. Male) Treating RN: Huel Coventry Primary Care Dewaun Kinzler: Hillery Aldo Other Clinician: Referring Asaf Elmquist: Hillery Aldo Treating Oden Lindaman/Extender: Bonnell Public Weeks in Treatment: 0 Wound Status Wound Number: 12 Primary Etiology: Lymphedema Wound Location: Left, Medial Lower Leg Wound Status: Open Wounding Event: Gradually Appeared Date Acquired: 07/21/2017 Weeks Of Treatment: 0 Clustered Wound: No Wound Measurements Length: (cm) 5.5 Width: (cm) 5 Depth: (cm) 0.1 Area: (cm) 21.598 Volume: (cm) 2.16 % Reduction in Area: 0% % Reduction in Volume: 0% Wound  Description Classification: Partial Thickness Periwound Skin Texture Texture Color No Abnormalities Noted: No No Abnormalities Noted: No Moisture No Abnormalities Noted: No Treatment Notes Wound #12 (Left, Medial Lower Leg) 1. Cleansed with: Clean wound with Normal Saline 2. Anesthetic Topical Lidocaine 4% cream to wound bed prior to debridement 4. Dressing Applied: Other dressing (specify in notes) Notes kerlix and Theatre manager) Signed: 08/08/2017 5:24:10 PM By: Elliot Gurney, BSN, RN, CWS, Kim RN, BSN Entered By: Elliot Gurney, BSN, RN, CWS, Kim on 08/07/2017 14:36:42 Hansmann, [name] ([id number]) -------------------------------------------------------------------------------- Wound Assessment Details Patient Name: Burnside, Breeze P. Date of Service: 08/07/2017 1:30 PM Medical Record Number: [id number] Patient Account Number: [id number] Date of Birth/Sex: 1960/07/18 (57 y.o. Male) Treating RN: Huel Coventry Primary Care Baneen Wieseler: Hillery Aldo Other Clinician: Referring Larra Crunkleton: Hillery Aldo Treating Alik Mawson/Extender: Kathreen Cosier in Treatment: 0 Wound Status Wound Number: 13 Primary Etiology: Lymphedema Wound Location: Right, Medial, Circumferential Lower Wound Status: Open Leg Wounding Event: Gradually Appeared Date Acquired: 07/21/2017 Weeks Of Treatment: 0 Clustered Wound: Yes Wound Measurements Length: (cm) 3 Width: (cm) 2 Depth: (cm) 0.1 Area: (cm) 4.712 Volume: (cm) 0.471 % Reduction in Area: 85% % Reduction in Volume: 85% Wound Description Classification: Partial Thickness Periwound Skin Texture Texture Color No Abnormalities Noted: No No Abnormalities Noted: No Moisture No Abnormalities Noted: No Treatment Notes Wound #13 (Right, Medial, Circumferential Lower Leg) 1. Cleansed with: Clean wound with Normal Saline 2. Anesthetic Topical Lidocaine 4% cream to wound bed prior to debridement 4. Dressing Applied: Other dressing (specify in  notes) Notes kerlix and Theatre manager) Signed: 08/08/2017 5:24:10 PM By: Elliot Gurney, BSN, RN, CWS, Kim RN, BSN Entered By: Elliot Gurney, BSN, RN, CWS, Kim on 08/07/2017 14:36:42

## 2017-08-12 ENCOUNTER — Ambulatory Visit: Payer: Medicare HMO | Admitting: Physician Assistant

## 2017-08-15 ENCOUNTER — Encounter: Payer: Medicare HMO | Admitting: Physician Assistant

## 2017-08-15 DIAGNOSIS — E11622 Type 2 diabetes mellitus with other skin ulcer: Secondary | ICD-10-CM | POA: Diagnosis not present

## 2017-08-17 NOTE — Progress Notes (Signed)
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 Thyroid Problems: No; Tuberculosis: Yes - Maternal Grandparents; Never smoker; Marital Status - Divorced; Alcohol Use: Never; Drug Use: No History; Caffeine Use: Daily - coffee; Financial Concerns: No; Food, Clothing or Shelter Needs: No; Support System Lacking: No; Transportation Concerns: No; Advanced Directives: No; Patient does not want information on Advanced Directives; Living Will: No Physician Affirmation I have reviewed and agree with the above information. Electronic Signature(s) Signed: 08/15/2017 5:21:02 PM By: Curtis Sites Signed: 08/16/2017 12:37:14 PM By: Lenda Kelp PA-C Entered By: Lenda Kelp on 08/15/2017 10:45:15 Haberl, [name] ([id number]) -------------------------------------------------------------------------------- SuperBill Details Patient Name: Hauger, Jaiyon P. Date of Service: 08/15/2017 Medical Record Number: [id number] Patient Account Number: [id number] Date of Birth/Sex: 11/27/1960 (56  y.o. 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 of right lower leg limited to breakdown of skin I50.42 Chronic combined systolic (congestive) and diastolic (congestive) heart failure I48.2 Chronic atrial fibrillation Facility Procedures CPT4 Code: [id number] Description: 99214 - WOUND CARE VISIT-LEV 4 EST PT Modifier: Quantity: 1 Physician Procedures CPT4 Code Description: [id number] - WC PHYS LEVEL 3 - EST PT ICD-10 Diagnosis Description I89.0 Lymphedema, not elsewhere classified E11.622 Type 2 diabetes mellitus with other skin ulcer L97.821 Non-pressure chronic ulcer of other part of left lower  leg lim L97.811 Non-pressure chronic ulcer of other part of right lower leg li Modifier: ited to breakdown mited to breakdown Quantity: 1 of skin of skin Electronic Signature(s) Signed: 08/16/2017 12:37:14 PM By: Lenda Kelp PA-C Entered By: Lenda Kelp on 08/15/2017 10:57:23

## 2017-08-18 LAB — CULTURE, FUNGUS WITHOUT SMEAR: SPECIAL REQUESTS: NORMAL

## 2017-08-19 ENCOUNTER — Ambulatory Visit: Payer: Medicare HMO | Admitting: Physician Assistant

## 2017-08-21 NOTE — Progress Notes (Addendum)
[name] ([id number]) Visit Report for 08/15/2017 Arrival Information Details Patient Name: [name], [name]. Date of Service: 08/15/2017 8:00 AM Medical Record Number: [id number] Patient Account Number: [id number] Date of Birth/Sex: 1961-02-11 (57 y.o. Male) Treating RN: Curtis Sites Primary Care Kelbie Moro: Hillery Aldo Other Clinician: Referring Hunt Zajicek: Hillery Aldo Treating Saliha Salts/Extender: Linwood Dibbles, HOYT Weeks in Treatment: 1 Visit Information History Since Last Visit Added or deleted any medications: No Patient Arrived: Ambulatory Any new allergies or adverse reactions: No Arrival Time: 08:21 Had a fall or experienced change in No Accompanied By: self activities of daily living that may affect Transfer Assistance: None risk of falls: Patient Identification Verified: Yes Signs or symptoms of abuse/neglect since last visito No Secondary Verification Process Yes Hospitalized since last visit: No Completed: Has Dressing in Place as Prescribed: Yes Patient Requires Transmission-Based No Pain Present Now: No Precautions: Patient Has Alerts: Yes Patient Alerts: noncompressible bilateral Electronic Signature(s) Signed: 08/15/2017 5:21:02 PM By: Curtis Sites Entered By: Curtis Sites on 08/15/2017 08:21:49 Meder, [name] ([id number]) -------------------------------------------------------------------------------- Clinic Level of Care Assessment Details Patient Name: [name]. Date of Service: 08/15/2017 8:00 AM Medical Record Number: [id number] Patient Account Number: [id number] Date of Birth/Sex: March 15, 1961 (57 y.o. Male) Treating RN: Curtis Sites Primary Care Kadeshia Kasparian: Hillery Aldo Other Clinician: Referring Mareli Antunes: Hillery Aldo Treating Rion Catala/Extender: Linwood Dibbles, HOYT Weeks in Treatment: 1 Clinic Level of Care Assessment Items TOOL 4 Quantity Score []  - Use when only an EandM is performed on FOLLOW-UP visit 0 ASSESSMENTS - Nursing Assessment /  Reassessment X - Reassessment of Co-morbidities (includes updates in patient status) 1 10 X- 1 5 Reassessment of Adherence to Treatment Plan ASSESSMENTS - Wound and Skin Assessment / Reassessment []  - Simple Wound Assessment / Reassessment - one wound 0 X- 3 5 Complex Wound Assessment / Reassessment - multiple wounds []  - 0 Dermatologic / Skin Assessment (not related to wound area) ASSESSMENTS - Focused Assessment X - Circumferential Edema Measurements - multi extremities 2 5 []  - 0 Nutritional Assessment / Counseling / Intervention X- 1 5 Lower Extremity Assessment (monofilament, tuning fork, pulses) []  - 0 Peripheral Arterial Disease Assessment (using hand held doppler) ASSESSMENTS - Ostomy and/or Continence Assessment and Care []  - Incontinence Assessment and Management 0 []  - 0 Ostomy Care Assessment and Management (repouching, etc.) PROCESS - Coordination of Care X - Simple Patient / Family Education for ongoing care 1 15 []  - 0 Complex (extensive) Patient / Family Education for ongoing care []  - 0 Staff obtains Chiropractor, Records, Test Results / Process Orders []  - 0 Staff telephones HHA, Nursing Homes / Clarify orders / etc []  - 0 Routine Transfer to another Facility (non-emergent condition) []  - 0 Routine Hospital Admission (non-emergent condition) []  - 0 New Admissions / Manufacturing engineer / Ordering NPWT, Apligraf, etc. []  - 0 Emergency Hospital Admission (emergent condition) X- 1 10 Simple Discharge Coordination Goodwine, Cruze [name]. ([id number]) []  - 0 Complex (extensive) Discharge Coordination PROCESS - Special Needs []  - Pediatric / Minor Patient Management 0 []  - 0 Isolation Patient Management []  - 0 Hearing / Language / Visual special needs []  - 0 Assessment of Community assistance (transportation, D/C planning, etc.) []  - 0 Additional assistance / Altered mentation []  - 0 Support Surface(s) Assessment (bed, cushion, seat, etc.) INTERVENTIONS -  Wound Cleansing / Measurement []  - Simple Wound Cleansing - one wound 0 X- 3 5 Complex Wound Cleansing - multiple wounds X- 1 5 Wound Imaging (photographs - any number of wounds) []  -  0 Wound Tracing (instead of photographs) []  - 0 Simple Wound Measurement - one wound X- 3 5 Complex Wound Measurement - multiple wounds INTERVENTIONS - Wound Dressings []  - Small Wound Dressing one or multiple wounds 0 X- 3 15 Medium Wound Dressing one or multiple wounds []  - 0 Large Wound Dressing one or multiple wounds []  - 0 Application of Medications - topical []  - 0 Application of Medications - injection INTERVENTIONS - Miscellaneous []  - External ear exam 0 []  - 0 Specimen Collection (cultures, biopsies, blood, body fluids, etc.) []  - 0 Specimen(s) / Culture(s) sent or taken to Lab for analysis []  - 0 Patient Transfer (multiple staff / Michiel SitesHoyer Lift / Similar devices) []  - 0 Simple Staple / Suture removal (25 or less) []  - 0 Complex Staple / Suture removal (26 or more) []  - 0 Hypo / Hyperglycemic Management (close monitor of Blood Glucose) []  - 0 Ankle / Brachial Index (ABI) - do not check if billed separately X- 1 5 Vital Signs Hannig, Alyaan [name]. ([id number]) Has the patient been seen at the hospital within the last three years: Yes Total Score: 155 Level Of Care: New/Established - Level 4 Electronic Signature(s) Signed: 08/15/2017 5:21:02 PM By: Curtis Sitesorthy, Joanna Entered By: Curtis Sitesorthy, Joanna on 08/15/2017 09:05:14 Kitzmiller, Laren EvertsJIM [name]. ([id number]) -------------------------------------------------------------------------------- Encounter Discharge Information Details Patient Name: [name]. Date of Service: 08/15/2017 8:00 AM Medical Record Number: [id number] Patient Account Number: [id number] Date of Birth/Sex: 02-26-1961 [age] y.o. Male) Treating RN: Curtis Sitesorthy, Joanna Primary Care Ariellah Faust: Hillery AldoPATEL, SARAH Other Clinician: Referring Joeleen Wortley: Hillery AldoPATEL, SARAH Treating Jemar Paulsen/Extender: Linwood DibblesSTONE III,  HOYT Weeks in Treatment: 1 Encounter Discharge Information Items Discharge Pain Level: 0 Discharge Condition: Stable Ambulatory Status: Ambulatory Discharge Destination: Home Transportation: Private Auto Accompanied By: self Schedule Follow-up Appointment: Yes Medication Reconciliation completed and No provided to Patient/Care Loukisha Gunnerson: Provided on Clinical Summary of Care: 08/15/2017 Form Type Recipient Paper Patient JC Electronic Signature(s) Signed: 08/26/2017 3:56:18 PM By: Elliot GurneyWoody, BSN, RN, CWS, Kim RN, BSN Previous Signature: 08/19/2017 8:07:32 AM Version By: Gwenlyn PerkingMoore, Shelia Entered By: Elliot GurneyWoody, BSN, RN, CWS, Kim on 08/25/2017 15:48:23 Spanier, Laren EvertsJIM [name]. ([id number]) -------------------------------------------------------------------------------- Lower Extremity Assessment Details Patient Name: Doyel, Deagan [name]. Date of Service: 08/15/2017 8:00 AM Medical Record Number: [id number] Patient Account Number: [id number] Date of Birth/Sex: 02-26-1961 [age] y.o. Male) Treating RN: Curtis Sitesorthy, Joanna Primary Care Kriss Ishler: Hillery AldoPATEL, SARAH Other Clinician: Referring Zahriah Roes: Hillery AldoPATEL, SARAH Treating Ellisyn Icenhower/Extender: Linwood DibblesSTONE III, HOYT Weeks in Treatment: 1 Edema Assessment Assessed: [Left: No] [Right: No] [Left: Edema] [Right: :] Calf Left: Right: Point of Measurement: 38 cm From Medial Instep 47.7 cm 46.8 cm Ankle Left: Right: Point of Measurement: 14 cm From Medial Instep 31.3 cm 29.7 cm Vascular Assessment Pulses: Dorsalis Pedis Palpable: [Left:Yes] [Right:Yes] Posterior Tibial Extremity colors, hair growth, and conditions: Extremity Color: [Left:Hyperpigmented] [Right:Hyperpigmented] Hair Growth on Extremity: [Left:No] [Right:No] Temperature of Extremity: [Left:Warm] [Right:Warm] Capillary Refill: [Left:< 3 seconds] [Right:< 3 seconds] Electronic Signature(s) Signed: 08/15/2017 5:21:02 PM By: Curtis Sitesorthy, Joanna Entered By: Curtis Sitesorthy, Joanna on 08/15/2017 08:36:10 Dupree, Laren EvertsJIM [name].  ([id number]) -------------------------------------------------------------------------------- Multi Wound Chart Details Patient Name: Beretta, Aldrick [name]. Date of Service: 08/15/2017 8:00 AM Medical Record Number: [id number] Patient Account Number: [id number] Date of Birth/Sex: 02-26-1961 [age] y.o. Male) Treating RN: Curtis Sitesorthy, Joanna Primary Care Timesha Cervantez: Hillery AldoPATEL, SARAH Other Clinician: Referring Cleland Simkins: Hillery AldoPATEL, SARAH Treating Armonie Mettler/Extender: Linwood DibblesSTONE III, HOYT Weeks in Treatment: 1 Vital Signs Height(in): 74 Pulse(bpm): 69 Weight(lbs): 310 Blood Pressure(mmHg): 115/70 Body Mass Index(BMI): 40 Temperature(F): 98.4 Respiratory Rate 20 (breaths/min): Photos: [14:No Photos] [15:No  Photos] [16:No Photos] Wound Location: [14:Left Lower Leg - Anterior] [15:Left Lower Leg - Posterior] [16:Right Lower Leg - Anterior] Wounding Event: [14:Gradually Appeared] [15:Gradually Appeared] [16:Gradually Appeared] Primary Etiology: [14:Venous Leg Ulcer] [15:Venous Leg Ulcer] [16:Venous Leg Ulcer] Comorbid History: [14:Lymphedema, Sleep Apnea, Arrhythmia, Deep Vein Thrombosis, Hypertension, Myocardial Infarction, Type II Diabetes, Neuropathy] [15:Lymphedema, Sleep Apnea, Arrhythmia, Deep Vein Thrombosis, Hypertension, Myocardial Infarction, Type  II Diabetes, Neuropathy] [16:Lymphedema, Sleep Apnea, Arrhythmia, Deep Vein Thrombosis, Hypertension, Myocardial Infarction, Type II Diabetes, Neuropathy] Date Acquired: [14:08/15/2017] [15:08/15/2017] [16:08/15/2017] Weeks of Treatment: [14:0] [15:0] [16:0] Wound Status: [14:Open] [15:Open] [16:Open] Measurements L x W x D [14:1.9x2.9x0.1] [15:1.1x1.3x0.1] [16:3x2.5x0.1] (cm) Area (cm) : [14:4.328] [15:1.123] [16:5.89] Volume (cm) : [14:0.433] [15:0.112] [16:0.589] Classification: [14:Partial Thickness] [15:Partial Thickness] [16:Partial Thickness] Exudate Amount: [14:Large] [15:Large] [16:Large] Exudate Type: [14:Serous] [15:Serous] [16:Serous] Exudate Color:  [14:amber] [15:amber] [16:amber] Wound Margin: [14:Flat and Intact] [15:Flat and Intact] [16:Flat and Intact] Granulation Amount: [14:Large (67-100%)] [15:Large (67-100%)] [16:Large (67-100%)] Granulation Quality: [14:Red] [15:Red] [16:Red] Necrotic Amount: [14:None Present (0%)] [15:None Present (0%)] [16:None Present (0%)] Exposed Structures: [14:Fascia: No Fat Layer (Subcutaneous Tissue) Exposed: No Tendon: No Muscle: No Joint: No Bone: No] [15:Fascia: No Fat Layer (Subcutaneous Tissue) Exposed: No Tendon: No Muscle: No Joint: No Bone: No] [16:Fascia: No Fat Layer (Subcutaneous Tissue)  Exposed: No Tendon: No Muscle: No Joint: No Bone: No] Epithelialization: [14:Medium (34-66%)] [15:Medium (34-66%)] [16:None] Periwound Skin Texture: [14:Excoriation: No Induration: No Callus: No Crepitus: No] [15:Excoriation: No Induration: No Callus: No Crepitus: No] [16:Excoriation: No Induration: No Callus: No Crepitus: No] Rash: No Rash: No Rash: No Scarring: No Scarring: No Scarring: No Periwound Skin Moisture: Maceration: No Maceration: No Maceration: No Dry/Scaly: No Dry/Scaly: No Dry/Scaly: No Periwound Skin Color: Hemosiderin Staining: Yes Hemosiderin Staining: Yes Hemosiderin Staining: Yes Atrophie Blanche: No Atrophie Blanche: No Atrophie Blanche: No Cyanosis: No Cyanosis: No Cyanosis: No Ecchymosis: No Ecchymosis: No Ecchymosis: No Erythema: No Erythema: No Erythema: No Mottled: No Mottled: No Mottled: No Pallor: No Pallor: No Pallor: No Rubor: No Rubor: No Rubor: No Temperature: No Abnormality No Abnormality No Abnormality Tenderness on Palpation: No No No Wound Preparation: Ulcer Cleansing: Ulcer Cleansing: Ulcer Cleansing: Rinsed/Irrigated with Saline Rinsed/Irrigated with Saline Rinsed/Irrigated with Saline Topical Anesthetic Applied: Topical Anesthetic Applied: Topical Anesthetic Applied: Other: lidocaine 4% Other: lidocaine 4% Other: lidocaine  4% Treatment Notes Electronic Signature(s) Signed: 08/15/2017 5:21:02 PM By: Curtis Sites Entered By: Curtis Sites on 08/15/2017 08:41:50 Yanik, [name] ([id number]) -------------------------------------------------------------------------------- Multi-Disciplinary Care Plan Details Patient Name: Minter, Ahmed [name]. Date of Service: 08/15/2017 8:00 AM Medical Record Number: [id number] Patient Account Number: [id number] Date of Birth/Sex: 1960/08/15 (57 y.o. Male) Treating RN: Curtis Sites Primary Care Fern Canova: Hillery Aldo Other Clinician: Referring Chriselda Leppert: Hillery Aldo Treating Sal Spratley/Extender: Linwood Dibbles, HOYT Weeks in Treatment: 1 Active Inactive Electronic Signature(s) Signed: 08/15/2017 5:21:02 PM By: Curtis Sites Entered By: Curtis Sites on 08/15/2017 08:41:42 Dillavou, [name] ([id number]) -------------------------------------------------------------------------------- Pain Assessment Details Patient Name: Doubleday, Izael [name]. Date of Service: 08/15/2017 8:00 AM Medical Record Number: [id number] Patient Account Number: [id number] Date of Birth/Sex: Aug 31, 1960 (57 y.o. Male) Treating RN: Curtis Sites Primary Care Miroslava Santellan: Hillery Aldo Other Clinician: Referring Anahis Furgeson: Hillery Aldo Treating Charizma Gardiner/Extender: Linwood Dibbles, HOYT Weeks in Treatment: 1 Active Problems Location of Pain Severity and Description of Pain Patient Has Paino No Site Locations Pain Management and Medication Current Pain Management: Notes Topical or injectable lidocaine is offered to patient for acute pain when surgical debridement is performed. If needed, Patient is instructed to use over the counter  pain medication for the following 24-48 hours after debridement. Wound care MDs do not prescribed pain medications. Patient has chronic pain or uncontrolled pain. Patient has been instructed to make an appointment with their Primary Care Physician for pain management. Electronic Signature(s) Signed:  08/15/2017 5:21:02 PM By: Curtis Sites Entered By: Curtis Sites on 08/15/2017 08:22:00 Wardle, [name] ([id number]) -------------------------------------------------------------------------------- Patient/Caregiver Education Details Patient Name: [name]. Date of Service: 08/15/2017 8:00 AM Medical Record Number: [id number] Patient Account Number: [id number] Date of Birth/Gender: [birth year] (57 y.o. Male) Treating RN: Phillis Haggis Primary Care Physician: Hillery Aldo Other Clinician: Referring Physician: Hillery Aldo Treating Physician/Extender: Skeet Simmer in Treatment: 1 Education Assessment Education Provided To: Patient Education Topics Provided Wound/Skin Impairment: Handouts: Caring for Your Ulcer, Other: do not get wraps wet Methods: Demonstration, Explain/Verbal Responses: State content correctly Electronic Signature(s) Signed: 08/26/2017 3:56:18 PM By: Elliot Gurney, BSN, RN, CWS, Kim RN, BSN Previous Signature: 08/20/2017 4:30:35 PM Version By: Alejandro Mulling Entered By: Elliot Gurney BSN, RN, CWS, Kim on 08/25/2017 15:48:43 Gilson, [name] ([id number]) -------------------------------------------------------------------------------- Wound Assessment Details Patient Name: Hoston, Carl [name]. Date of Service: 08/15/2017 8:00 AM Medical Record Number: [id number] Patient Account Number: [id number] Date of Birth/Sex: 03-26-1961 (57 y.o. Male) Treating RN: Curtis Sites Primary Care Umberto Pavek: Hillery Aldo Other Clinician: Referring Keyden Pavlov: Hillery Aldo Treating Ameri Cahoon/Extender: Linwood Dibbles, HOYT Weeks in Treatment: 1 Wound Status Wound Number: 14 Primary Venous Leg Ulcer Etiology: Wound Location: Left Lower Leg - Anterior Wound Open Wounding Event: Gradually Appeared Status: Date Acquired: 08/15/2017 Comorbid Lymphedema, Sleep Apnea, Arrhythmia, Deep Weeks Of Treatment: 0 History: Vein Thrombosis, Hypertension, Myocardial Clustered Wound: No Infarction, Type II  Diabetes, Neuropathy Photos Photo Uploaded By: Curtis Sites on 08/15/2017 11:10:59 Wound Measurements Length: (cm) 1.9 Width: (cm) 2.9 Depth: (cm) 0.1 Area: (cm) 4.328 Volume: (cm) 0.433 % Reduction in Area: % Reduction in Volume: Epithelialization: Medium (34-66%) Tunneling: No Undermining: No Wound Description Classification: Partial Thickness Wound Margin: Flat and Intact Exudate Amount: Large Exudate Type: Serous Exudate Color: amber Foul Odor After Cleansing: No Slough/Fibrino No Wound Bed Granulation Amount: Large (67-100%) Exposed Structure Granulation Quality: Red Fascia Exposed: No Necrotic Amount: None Present (0%) Fat Layer (Subcutaneous Tissue) Exposed: No Tendon Exposed: No Muscle Exposed: No Joint Exposed: No Bone Exposed: No Periwound Skin Texture Smola, Park [name]. ([id number]) Texture Color No Abnormalities Noted: No No Abnormalities Noted: No Callus: No Atrophie Blanche: No Crepitus: No Cyanosis: No Excoriation: No Ecchymosis: No Induration: No Erythema: No Rash: No Hemosiderin Staining: Yes Scarring: No Mottled: No Pallor: No Moisture Rubor: No No Abnormalities Noted: No Dry / Scaly: No Temperature / Pain Maceration: No Temperature: No Abnormality Wound Preparation Ulcer Cleansing: Rinsed/Irrigated with Saline Topical Anesthetic Applied: Other: lidocaine 4%, Electronic Signature(s) Signed: 08/15/2017 5:21:02 PM By: Curtis Sites Entered By: Curtis Sites on 08/15/2017 08:30:30 Kem, [name] ([id number]) -------------------------------------------------------------------------------- Wound Assessment Details Patient Name: Napier, Benjie [name]. Date of Service: 08/15/2017 8:00 AM Medical Record Number: [id number] Patient Account Number: [id number] Date of Birth/Sex: 11-06-1960 (57 y.o. Male) Treating RN: Curtis Sites Primary Care Parks Czajkowski: Hillery Aldo Other Clinician: Referring Lucendia Leard: Hillery Aldo Treating Oyinkansola Truax/Extender:  Linwood Dibbles, HOYT Weeks in Treatment: 1 Wound Status Wound Number: 15 Primary Venous Leg Ulcer Etiology: Wound Location: Left Lower Leg - Posterior Wound Open Wounding Event: Gradually Appeared Status: Date Acquired: 08/15/2017 Comorbid Lymphedema, Sleep Apnea, Arrhythmia, Deep Weeks Of Treatment: 0 History: Vein Thrombosis, Hypertension, Myocardial Clustered Wound: No Infarction, Type II Diabetes, Neuropathy Photos Photo Uploaded By: Curtis Sites on 08/15/2017  11:11:19 Wound Measurements Length: (cm) 1.1 Width: (cm) 1.3 Depth: (cm) 0.1 Area: (cm) 1.123 Volume: (cm) 0.112 % Reduction in Area: % Reduction in Volume: Epithelialization: Medium (34-66%) Tunneling: No Undermining: No Wound Description Classification: Partial Thickness Wound Margin: Flat and Intact Exudate Amount: Large Exudate Type: Serous Exudate Color: amber Foul Odor After Cleansing: No Slough/Fibrino No Wound Bed Granulation Amount: Large (67-100%) Exposed Structure Granulation Quality: Red Fascia Exposed: No Necrotic Amount: None Present (0%) Fat Layer (Subcutaneous Tissue) Exposed: No Tendon Exposed: No Muscle Exposed: No Joint Exposed: No Bone Exposed: No Periwound Skin Texture Hocutt, Ova [name]. ([id number]) Texture Color No Abnormalities Noted: No No Abnormalities Noted: No Callus: No Atrophie Blanche: No Crepitus: No Cyanosis: No Excoriation: No Ecchymosis: No Induration: No Erythema: No Rash: No Hemosiderin Staining: Yes Scarring: No Mottled: No Pallor: No Moisture Rubor: No No Abnormalities Noted: No Dry / Scaly: No Temperature / Pain Maceration: No Temperature: No Abnormality Wound Preparation Ulcer Cleansing: Rinsed/Irrigated with Saline Topical Anesthetic Applied: Other: lidocaine 4%, Electronic Signature(s) Signed: 08/15/2017 5:21:02 PM By: Curtis Sites Entered By: Curtis Sites on 08/15/2017 08:32:21 Navarrette, [name]  ([id number]) -------------------------------------------------------------------------------- Wound Assessment Details Patient Name: Behrend, Tomio [name]. Date of Service: 08/15/2017 8:00 AM Medical Record Number: [id number] Patient Account Number: [id number] Date of Birth/Sex: 1961-02-21 (57 y.o. Male) Treating RN: Curtis Sites Primary Care Robertt Buda: Hillery Aldo Other Clinician: Referring Carrel Leather: Hillery Aldo Treating Lineth Thielke/Extender: Linwood Dibbles, HOYT Weeks in Treatment: 1 Wound Status Wound Number: 16 Primary Venous Leg Ulcer Etiology: Wound Location: Right Lower Leg - Anterior Wound Open Wounding Event: Gradually Appeared Status: Date Acquired: 08/15/2017 Comorbid Lymphedema, Sleep Apnea, Arrhythmia, Deep Weeks Of Treatment: 0 History: Vein Thrombosis, Hypertension, Myocardial Clustered Wound: No Infarction, Type II Diabetes, Neuropathy Photos Photo Uploaded By: Curtis Sites on 08/15/2017 11:11:19 Wound Measurements Length: (cm) 3 Width: (cm) 2.5 Depth: (cm) 0.1 Area: (cm) 5.89 Volume: (cm) 0.589 % Reduction in Area: % Reduction in Volume: Epithelialization: None Tunneling: No Undermining: No Wound Description Classification: Partial Thickness Wound Margin: Flat and Intact Exudate Amount: Large Exudate Type: Serous Exudate Color: amber Foul Odor After Cleansing: No Slough/Fibrino No Wound Bed Granulation Amount: Large (67-100%) Exposed Structure Granulation Quality: Red Fascia Exposed: No Necrotic Amount: None Present (0%) Fat Layer (Subcutaneous Tissue) Exposed: No Tendon Exposed: No Muscle Exposed: No Joint Exposed: No Bone Exposed: No Periwound Skin Texture Tocci, Johnryan [name]. ([id number]) Texture Color No Abnormalities Noted: No No Abnormalities Noted: No Callus: No Atrophie Blanche: No Crepitus: No Cyanosis: No Excoriation: No Ecchymosis: No Induration: No Erythema: No Rash: No Hemosiderin Staining: Yes Scarring: No Mottled: No Pallor:  No Moisture Rubor: No No Abnormalities Noted: No Dry / Scaly: No Temperature / Pain Maceration: No Temperature: No Abnormality Wound Preparation Ulcer Cleansing: Rinsed/Irrigated with Saline Topical Anesthetic Applied: Other: lidocaine 4%, Electronic Signature(s) Signed: 08/15/2017 5:21:02 PM By: Curtis Sites Entered By: Curtis Sites on 08/15/2017 08:34:01 Halloran, [name] ([id number]) -------------------------------------------------------------------------------- Vitals Details Patient Name: Kurkowski, Daxon [name]. Date of Service: 08/15/2017 8:00 AM Medical Record Number: [id number] Patient Account Number: [id number] Date of Birth/Sex: 04-04-1961 (57 y.o. Male) Treating RN: Curtis Sites Primary Care Juliocesar Blasius: Hillery Aldo Other Clinician: Referring Cloys Vera: Hillery Aldo Treating Rayn Shorb/Extender: Linwood Dibbles, HOYT Weeks in Treatment: 1 Vital Signs Time Taken: 08:22 Temperature (F): 98.4 Height (in): 74 Pulse (bpm): 69 Weight (lbs): 310 Respiratory Rate (breaths/min): 20 Body Mass Index (BMI): 39.8 Blood Pressure (mmHg): 115/70 Reference Range: 80 - 120 mg / dl Electronic Signature(s) Signed: 08/15/2017 5:21:02 PM By: Curtis Sites Entered  ByCurtis Sites on 08/15/2017 16:10:96

## 2017-08-22 ENCOUNTER — Ambulatory Visit: Payer: Medicare HMO | Admitting: Physician Assistant

## 2017-08-25 ENCOUNTER — Ambulatory Visit: Payer: Medicare HMO | Admitting: Physician Assistant

## 2017-08-25 ENCOUNTER — Encounter: Payer: Medicare HMO | Attending: Physician Assistant | Admitting: Physician Assistant

## 2017-08-25 DIAGNOSIS — Z6839 Body mass index (BMI) 39.0-39.9, adult: Secondary | ICD-10-CM | POA: Insufficient documentation

## 2017-08-25 DIAGNOSIS — I5042 Chronic combined systolic (congestive) and diastolic (congestive) heart failure: Secondary | ICD-10-CM | POA: Insufficient documentation

## 2017-08-25 DIAGNOSIS — I482 Chronic atrial fibrillation: Secondary | ICD-10-CM | POA: Diagnosis not present

## 2017-08-25 DIAGNOSIS — Z955 Presence of coronary angioplasty implant and graft: Secondary | ICD-10-CM | POA: Diagnosis not present

## 2017-08-25 DIAGNOSIS — Y92480 Sidewalk as the place of occurrence of the external cause: Secondary | ICD-10-CM | POA: Diagnosis not present

## 2017-08-25 DIAGNOSIS — E1140 Type 2 diabetes mellitus with diabetic neuropathy, unspecified: Secondary | ICD-10-CM | POA: Insufficient documentation

## 2017-08-25 DIAGNOSIS — E1151 Type 2 diabetes mellitus with diabetic peripheral angiopathy without gangrene: Secondary | ICD-10-CM | POA: Diagnosis not present

## 2017-08-25 DIAGNOSIS — Z86711 Personal history of pulmonary embolism: Secondary | ICD-10-CM | POA: Insufficient documentation

## 2017-08-25 DIAGNOSIS — F329 Major depressive disorder, single episode, unspecified: Secondary | ICD-10-CM | POA: Diagnosis not present

## 2017-08-25 DIAGNOSIS — I110 Hypertensive heart disease with heart failure: Secondary | ICD-10-CM | POA: Diagnosis not present

## 2017-08-25 DIAGNOSIS — I890 Lymphedema, not elsewhere classified: Secondary | ICD-10-CM | POA: Insufficient documentation

## 2017-08-25 DIAGNOSIS — W19XXXA Unspecified fall, initial encounter: Secondary | ICD-10-CM | POA: Insufficient documentation

## 2017-08-25 DIAGNOSIS — E669 Obesity, unspecified: Secondary | ICD-10-CM | POA: Diagnosis not present

## 2017-08-25 DIAGNOSIS — Z8249 Family history of ischemic heart disease and other diseases of the circulatory system: Secondary | ICD-10-CM | POA: Insufficient documentation

## 2017-08-25 DIAGNOSIS — S61200A Unspecified open wound of right index finger without damage to nail, initial encounter: Secondary | ICD-10-CM | POA: Insufficient documentation

## 2017-08-25 DIAGNOSIS — Z809 Family history of malignant neoplasm, unspecified: Secondary | ICD-10-CM | POA: Insufficient documentation

## 2017-08-25 DIAGNOSIS — E11621 Type 2 diabetes mellitus with foot ulcer: Secondary | ICD-10-CM | POA: Diagnosis present

## 2017-08-25 DIAGNOSIS — I252 Old myocardial infarction: Secondary | ICD-10-CM | POA: Diagnosis not present

## 2017-08-25 DIAGNOSIS — L97511 Non-pressure chronic ulcer of other part of right foot limited to breakdown of skin: Secondary | ICD-10-CM | POA: Diagnosis not present

## 2017-08-25 DIAGNOSIS — L97821 Non-pressure chronic ulcer of other part of left lower leg limited to breakdown of skin: Secondary | ICD-10-CM | POA: Diagnosis not present

## 2017-08-25 DIAGNOSIS — L97521 Non-pressure chronic ulcer of other part of left foot limited to breakdown of skin: Secondary | ICD-10-CM | POA: Diagnosis not present

## 2017-08-25 DIAGNOSIS — J449 Chronic obstructive pulmonary disease, unspecified: Secondary | ICD-10-CM | POA: Diagnosis not present

## 2017-08-25 DIAGNOSIS — Z86718 Personal history of other venous thrombosis and embolism: Secondary | ICD-10-CM | POA: Insufficient documentation

## 2017-08-25 DIAGNOSIS — E11622 Type 2 diabetes mellitus with other skin ulcer: Secondary | ICD-10-CM | POA: Diagnosis not present

## 2017-08-27 ENCOUNTER — Inpatient Hospital Stay
Admission: EM | Admit: 2017-08-27 | Discharge: 2017-09-02 | DRG: 287 | Disposition: A | Discharge status: Home / Self Care | Payer: Medicare HMO | Attending: Internal Medicine | Admitting: Internal Medicine

## 2017-08-27 ENCOUNTER — Emergency Department: Payer: Medicare HMO

## 2017-08-27 ENCOUNTER — Other Ambulatory Visit: Payer: Self-pay

## 2017-08-27 ENCOUNTER — Encounter: Payer: Self-pay | Admitting: Specialist

## 2017-08-27 DIAGNOSIS — I2582 Chronic total occlusion of coronary artery: Secondary | ICD-10-CM | POA: Diagnosis present

## 2017-08-27 DIAGNOSIS — I480 Paroxysmal atrial fibrillation: Secondary | ICD-10-CM | POA: Diagnosis present

## 2017-08-27 DIAGNOSIS — I2584 Coronary atherosclerosis due to calcified coronary lesion: Secondary | ICD-10-CM | POA: Diagnosis present

## 2017-08-27 DIAGNOSIS — J9610 Chronic respiratory failure, unspecified whether with hypoxia or hypercapnia: Secondary | ICD-10-CM | POA: Diagnosis present

## 2017-08-27 DIAGNOSIS — E1142 Type 2 diabetes mellitus with diabetic polyneuropathy: Secondary | ICD-10-CM | POA: Diagnosis present

## 2017-08-27 DIAGNOSIS — F419 Anxiety disorder, unspecified: Secondary | ICD-10-CM | POA: Diagnosis present

## 2017-08-27 DIAGNOSIS — I110 Hypertensive heart disease with heart failure: Secondary | ICD-10-CM | POA: Diagnosis present

## 2017-08-27 DIAGNOSIS — Z794 Long term (current) use of insulin: Secondary | ICD-10-CM

## 2017-08-27 DIAGNOSIS — I5032 Chronic diastolic (congestive) heart failure: Secondary | ICD-10-CM | POA: Diagnosis present

## 2017-08-27 DIAGNOSIS — Z880 Allergy status to penicillin: Secondary | ICD-10-CM

## 2017-08-27 DIAGNOSIS — Z930 Tracheostomy status: Secondary | ICD-10-CM | POA: Diagnosis not present

## 2017-08-27 DIAGNOSIS — F329 Major depressive disorder, single episode, unspecified: Secondary | ICD-10-CM | POA: Diagnosis present

## 2017-08-27 DIAGNOSIS — Z6838 Body mass index (BMI) 38.0-38.9, adult: Secondary | ICD-10-CM

## 2017-08-27 DIAGNOSIS — I25119 Atherosclerotic heart disease of native coronary artery with unspecified angina pectoris: Secondary | ICD-10-CM | POA: Diagnosis present

## 2017-08-27 DIAGNOSIS — Z955 Presence of coronary angioplasty implant and graft: Secondary | ICD-10-CM | POA: Diagnosis not present

## 2017-08-27 DIAGNOSIS — F1010 Alcohol abuse, uncomplicated: Secondary | ICD-10-CM | POA: Diagnosis present

## 2017-08-27 DIAGNOSIS — E871 Hypo-osmolality and hyponatremia: Secondary | ICD-10-CM | POA: Diagnosis not present

## 2017-08-27 DIAGNOSIS — Y92009 Unspecified place in unspecified non-institutional (private) residence as the place of occurrence of the external cause: Secondary | ICD-10-CM

## 2017-08-27 DIAGNOSIS — Z7982 Long term (current) use of aspirin: Secondary | ICD-10-CM

## 2017-08-27 DIAGNOSIS — L0390 Cellulitis, unspecified: Secondary | ICD-10-CM | POA: Diagnosis present

## 2017-08-27 DIAGNOSIS — R079 Chest pain, unspecified: Secondary | ICD-10-CM

## 2017-08-27 DIAGNOSIS — K219 Gastro-esophageal reflux disease without esophagitis: Secondary | ICD-10-CM | POA: Diagnosis present

## 2017-08-27 DIAGNOSIS — J449 Chronic obstructive pulmonary disease, unspecified: Secondary | ICD-10-CM | POA: Diagnosis present

## 2017-08-27 DIAGNOSIS — I4892 Unspecified atrial flutter: Secondary | ICD-10-CM | POA: Diagnosis present

## 2017-08-27 DIAGNOSIS — Z86711 Personal history of pulmonary embolism: Secondary | ICD-10-CM

## 2017-08-27 DIAGNOSIS — Z7984 Long term (current) use of oral hypoglycemic drugs: Secondary | ICD-10-CM

## 2017-08-27 DIAGNOSIS — G4733 Obstructive sleep apnea (adult) (pediatric): Secondary | ICD-10-CM | POA: Diagnosis present

## 2017-08-27 DIAGNOSIS — E785 Hyperlipidemia, unspecified: Secondary | ICD-10-CM | POA: Diagnosis present

## 2017-08-27 DIAGNOSIS — Z7901 Long term (current) use of anticoagulants: Secondary | ICD-10-CM

## 2017-08-27 DIAGNOSIS — T501X5A Adverse effect of loop [high-ceiling] diuretics, initial encounter: Secondary | ICD-10-CM | POA: Diagnosis present

## 2017-08-27 DIAGNOSIS — I878 Other specified disorders of veins: Secondary | ICD-10-CM | POA: Diagnosis present

## 2017-08-27 DIAGNOSIS — I252 Old myocardial infarction: Secondary | ICD-10-CM

## 2017-08-27 LAB — GLUCOSE, CAPILLARY
GLUCOSE-CAPILLARY: 104 mg/dL — AB (ref 65–99)
Glucose-Capillary: 108 mg/dL — ABNORMAL HIGH (ref 65–99)

## 2017-08-27 LAB — CBC
HCT: 39.1 % — ABNORMAL LOW (ref 40.0–52.0)
HEMOGLOBIN: 13.4 g/dL (ref 13.0–18.0)
MCH: 31.1 pg (ref 26.0–34.0)
MCHC: 34.3 g/dL (ref 32.0–36.0)
MCV: 90.7 fL (ref 80.0–100.0)
Platelets: 237 10*3/uL (ref 150–440)
RBC: 4.31 MIL/uL — AB (ref 4.40–5.90)
RDW: 13.2 % (ref 11.5–14.5)
WBC: 10.3 10*3/uL (ref 3.8–10.6)

## 2017-08-27 LAB — BASIC METABOLIC PANEL
ANION GAP: 12 (ref 5–15)
BUN: 12 mg/dL (ref 6–20)
CALCIUM: 8.9 mg/dL (ref 8.9–10.3)
CO2: 22 mmol/L (ref 22–32)
Chloride: 83 mmol/L — ABNORMAL LOW (ref 101–111)
Creatinine, Ser: 0.6 mg/dL — ABNORMAL LOW (ref 0.61–1.24)
Glucose, Bld: 119 mg/dL — ABNORMAL HIGH (ref 65–99)
Potassium: 4.9 mmol/L (ref 3.5–5.1)
SODIUM: 117 mmol/L — AB (ref 135–145)

## 2017-08-27 LAB — BRAIN NATRIURETIC PEPTIDE: B NATRIURETIC PEPTIDE 5: 134 pg/mL — AB (ref 0.0–100.0)

## 2017-08-27 LAB — OSMOLALITY, URINE: OSMOLALITY UR: 154 mosm/kg — AB (ref 300–900)

## 2017-08-27 LAB — TROPONIN I: Troponin I: <0.03 ng/mL (ref <0.03)

## 2017-08-27 LAB — MRSA PCR SCREENING: MRSA BY PCR: NEGATIVE

## 2017-08-27 MED ORDER — ATORVASTATIN CALCIUM 20 MG PO TABS
40.0000 mg | ORAL_TABLET | Freq: Every day | ORAL | Status: DC
  Administered 2017-08-27 – 2017-09-01 (×6): 40 mg via ORAL
  Filled 2017-08-27 (×6): qty 2

## 2017-08-27 MED ORDER — RIVAROXABAN 20 MG PO TABS
20.0000 mg | ORAL_TABLET | Freq: Every day | ORAL | Status: DC
  Administered 2017-08-28: 20 mg via ORAL
  Filled 2017-08-27 (×2): qty 1

## 2017-08-27 MED ORDER — ENALAPRIL MALEATE 10 MG PO TABS
10.0000 mg | ORAL_TABLET | Freq: Two times a day (BID) | ORAL | Status: DC
  Administered 2017-08-27 – 2017-09-02 (×12): 10 mg via ORAL
  Filled 2017-08-27 (×13): qty 1

## 2017-08-27 MED ORDER — METFORMIN HCL ER 500 MG PO TB24
1000.0000 mg | ORAL_TABLET | Freq: Two times a day (BID) | ORAL | Status: DC
  Administered 2017-08-27 – 2017-09-02 (×12): 1000 mg via ORAL
  Filled 2017-08-27 (×13): qty 2

## 2017-08-27 MED ORDER — METOPROLOL TARTRATE 50 MG PO TABS
100.0000 mg | ORAL_TABLET | Freq: Two times a day (BID) | ORAL | Status: DC
  Administered 2017-08-27 – 2017-09-02 (×12): 100 mg via ORAL
  Filled 2017-08-27 (×12): qty 2

## 2017-08-27 MED ORDER — INSULIN DETEMIR 100 UNIT/ML ~~LOC~~ SOLN
30.0000 [IU] | Freq: Two times a day (BID) | SUBCUTANEOUS | Status: DC
  Administered 2017-08-27 – 2017-09-02 (×12): 30 [IU] via SUBCUTANEOUS
  Filled 2017-08-27 (×13): qty 0.3

## 2017-08-27 MED ORDER — BUSPIRONE HCL 10 MG PO TABS
10.0000 mg | ORAL_TABLET | Freq: Two times a day (BID) | ORAL | Status: DC | PRN
  Administered 2017-08-28 (×2): 10 mg via ORAL
  Filled 2017-08-27 (×3): qty 1

## 2017-08-27 MED ORDER — MORPHINE SULFATE (PF) 4 MG/ML IV SOLN
4.0000 mg | Freq: Once | INTRAVENOUS | Status: AC
  Administered 2017-08-27: 4 mg via INTRAVENOUS

## 2017-08-27 MED ORDER — ADULT MULTIVITAMIN W/MINERALS CH
1.0000 | ORAL_TABLET | Freq: Every day | ORAL | Status: DC
  Administered 2017-08-27 – 2017-09-02 (×7): 1 via ORAL
  Filled 2017-08-27 (×7): qty 1

## 2017-08-27 MED ORDER — OXYCODONE HCL 5 MG PO TABS
5.0000 mg | ORAL_TABLET | Freq: Four times a day (QID) | ORAL | Status: DC | PRN
  Administered 2017-08-28 – 2017-09-02 (×16): 5 mg via ORAL
  Filled 2017-08-27 (×15): qty 1

## 2017-08-27 MED ORDER — DOCUSATE SODIUM 100 MG PO CAPS: 100.0000 mg | ORAL_CAPSULE | Freq: Two times a day (BID) | ORAL | Status: DC

## 2017-08-27 MED ORDER — ACETAMINOPHEN 325 MG PO TABS: 650.0000 mg | ORAL_TABLET | Freq: Four times a day (QID) | ORAL | Status: DC | PRN

## 2017-08-27 MED ORDER — FOLIC ACID 1 MG PO TABS
1.0000 mg | ORAL_TABLET | Freq: Every day | ORAL | Status: DC
  Administered 2017-08-27 – 2017-09-02 (×7): 1 mg via ORAL
  Filled 2017-08-27 (×7): qty 1

## 2017-08-27 MED ORDER — ACETAMINOPHEN 650 MG RE SUPP: 650.0000 mg | Freq: Four times a day (QID) | RECTAL | Status: DC | PRN

## 2017-08-27 MED ORDER — MORPHINE SULFATE (PF) 4 MG/ML IV SOLN
INTRAVENOUS | Status: AC
  Filled 2017-08-27: qty 1

## 2017-08-27 MED ORDER — VITAMIN B-1 100 MG PO TABS
100.0000 mg | ORAL_TABLET | Freq: Every day | ORAL | Status: DC
  Administered 2017-08-27 – 2017-09-02 (×7): 100 mg via ORAL
  Filled 2017-08-27 (×7): qty 1

## 2017-08-27 MED ORDER — LAMOTRIGINE 100 MG PO TABS
150.0000 mg | ORAL_TABLET | Freq: Two times a day (BID) | ORAL | Status: DC
  Administered 2017-08-27 – 2017-09-02 (×12): 150 mg via ORAL
  Filled 2017-08-27 (×2): qty 6
  Filled 2017-08-27: qty 1.5
  Filled 2017-08-27 (×2): qty 6
  Filled 2017-08-27 (×2): qty 1.5
  Filled 2017-08-27: qty 6
  Filled 2017-08-27: qty 1.5
  Filled 2017-08-27: qty 6
  Filled 2017-08-27: qty 1.5
  Filled 2017-08-27 (×3): qty 6
  Filled 2017-08-27: qty 1.5
  Filled 2017-08-27 (×4): qty 6

## 2017-08-27 MED ORDER — SODIUM CHLORIDE 0.9 % IV SOLN
INTRAVENOUS | Status: DC
  Administered 2017-08-27 – 2017-08-29 (×3): via INTRAVENOUS

## 2017-08-27 MED ORDER — INSULIN ASPART 100 UNIT/ML ~~LOC~~ SOLN
0.0000 [IU] | Freq: Three times a day (TID) | SUBCUTANEOUS | Status: DC
  Administered 2017-08-28: 3 [IU] via SUBCUTANEOUS
  Administered 2017-08-28 – 2017-08-29 (×2): 4 [IU] via SUBCUTANEOUS
  Administered 2017-08-30 – 2017-08-31 (×3): 3 [IU] via SUBCUTANEOUS
  Filled 2017-08-27 (×6): qty 1

## 2017-08-27 MED ORDER — OXYCODONE-ACETAMINOPHEN 5-325 MG PO TABS
1.0000 | ORAL_TABLET | Freq: Four times a day (QID) | ORAL | Status: DC | PRN
  Administered 2017-08-28 – 2017-09-02 (×15): 1 via ORAL
  Filled 2017-08-27 (×15): qty 1

## 2017-08-27 MED ORDER — GABAPENTIN 400 MG PO CAPS
800.0000 mg | ORAL_CAPSULE | Freq: Two times a day (BID) | ORAL | Status: DC
  Administered 2017-08-27 – 2017-09-02 (×12): 800 mg via ORAL
  Filled 2017-08-27 (×13): qty 2

## 2017-08-27 MED ORDER — OXYCODONE-ACETAMINOPHEN 10-325 MG PO TABS: 1.0000 | ORAL_TABLET | Freq: Four times a day (QID) | ORAL | Status: DC | PRN

## 2017-08-27 MED ORDER — ONDANSETRON HCL 4 MG/2ML IJ SOLN: 4.0000 mg | Freq: Four times a day (QID) | INTRAMUSCULAR | Status: DC | PRN

## 2017-08-27 MED ORDER — MORPHINE SULFATE (PF) 2 MG/ML IV SOLN
1.0000 mg | INTRAVENOUS | Status: DC | PRN
  Administered 2017-08-27 – 2017-08-29 (×5): 2 mg via INTRAVENOUS
  Administered 2017-09-01: 1 mg via INTRAVENOUS
  Administered 2017-09-01: 2 mg via INTRAVENOUS
  Administered 2017-09-02: 1 mg via INTRAVENOUS
  Filled 2017-08-27 (×8): qty 1

## 2017-08-27 MED ORDER — PANTOPRAZOLE SODIUM 40 MG PO TBEC
40.0000 mg | DELAYED_RELEASE_TABLET | Freq: Every day | ORAL | Status: DC
  Administered 2017-08-28 – 2017-09-02 (×6): 40 mg via ORAL
  Filled 2017-08-27 (×6): qty 1

## 2017-08-27 MED ORDER — DOCUSATE SODIUM 100 MG PO CAPS
100.0000 mg | ORAL_CAPSULE | Freq: Two times a day (BID) | ORAL | Status: DC | PRN
Start: 2017-08-27 — End: 2017-08-28
  Administered 2017-08-27: 100 mg via ORAL
  Filled 2017-08-27: qty 1

## 2017-08-27 MED ORDER — ONDANSETRON HCL 4 MG PO TABS: 4.0000 mg | ORAL_TABLET | Freq: Four times a day (QID) | ORAL | Status: DC | PRN

## 2017-08-27 MED ORDER — LORAZEPAM 2 MG/ML IJ SOLN
2.0000 mg | INTRAMUSCULAR | Status: DC | PRN
  Administered 2017-08-28 – 2017-08-31 (×10): 2 mg via INTRAVENOUS
  Filled 2017-08-27 (×10): qty 1

## 2017-08-27 MED ORDER — INSULIN ASPART 100 UNIT/ML ~~LOC~~ SOLN: 0.0000 [IU] | Freq: Every day | SUBCUTANEOUS | Status: DC

## 2017-08-27 NOTE — ED Triage Notes (Addendum)
Pt arrives via ems from home with chest pain that has been increasing over the past 2 hours, pt rates pain at 7/10. Pt has a hx of a.flutter and is followed by dr Juliann Parescallwood, pt has a trache and states has had it for 10 years. Pt reports that his chest has pressure as if he has bronchitis, pt able to cough mucous from trache but denies it being more or different than usual. 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

## 2017-08-27 NOTE — H&P (Signed)
[place] [facility] [place] NAME: [name]    MR#:  [id number]  DATE OF BIRTH:  1961-06-12  DATE OF ADMISSION:  08/27/2017  PRIMARY CARE PHYSICIAN: Denton Lank, MD   REQUESTING/REFERRING PHYSICIAN: Dr. Conni Slipper  CHIEF COMPLAINT:   Chief Complaint  Patient presents with  . Chest Pain  . Shortness of Breath    HISTORY OF PRESENT ILLNESS:  [name]  is a 57 y.o. male with a known history of morbid obesity, chronic respiratory failure status post tracheostomy, history of pulmonary embolism, paroxysmal atrial fibrillation/flutter, obstructive sleep apnea, history of pancreatitis, diabetes, peripheral neuropathy who presents to the hospital due to shortness of breath/chest tightness. Patient says he was in usual state of health and developed some chest discomfort/shortness of breath and felt like his heart was racing and that he was in atrial flutter/fibrillation with rapid heart rate. He therefore came to the ER for further evaluation. Patient's heart rates in the ER were not significantly elevated, his cardiac markers were negative, but incidentally he was noted to have a sodium of 117. Patient is on diuretics and has been compliant with it he denies any nausea vomiting or diarrhea, but does admit to some poor by mouth intake over the past 2-3 days. He does drink about 3 beers daily. Given his acute hyponatremia hospitalist services were contacted further treatment and evaluation.  PAST MEDICAL HISTORY:   Past Medical History:  Diagnosis Date  . CHF (congestive heart failure) ([place])   . Diabetes mellitus ([place])   . Diabetic peripheral neuropathy ([place])   . Elevated liver enzymes    fatty liver [facility]  . GERD (gastroesophageal reflux disease)   . History of pulmonary embolus (PE)   . Hyperlipidemia   . Hypertension   . Myocardial infarct ([place])   . OSA (obstructive sleep apnea)   . Pancreatitis    [facility] d/t alcholic  induced with ARDS  . Renal insufficiency     PAST SURGICAL HISTORY:   Past Surgical History:  Procedure Laterality Date  . CARDIAC SURGERY     With stent placement  . FLEXIBLE BRONCHOSCOPY N/A 07/28/2017   Procedure: FLEXIBLE BRONCHOSCOPY;  Surgeon: [name], MD;  Location: [facility] ORS;  Service: Pulmonary;  Laterality: N/A;  . TRACHEOSTOMY  2007  . VASECTOMY      SOCIAL HISTORY:   Social History   Tobacco Use  . Smoking status: Never Smoker  . Smokeless tobacco: Never Used  Substance Use Topics  . Alcohol use: Yes    Alcohol/week: 3.6 - 4.2 oz    Types: 6 - 7 Cans of beer per week    FAMILY HISTORY:   Family History  Problem Relation Age of Onset  . Heart attack Mother   . Coronary artery disease Father   . Heart attack Father   . Kidney cancer Neg Hx   . Kidney disease Neg Hx   . Prostate cancer Neg Hx     DRUG ALLERGIES:   Allergies  Allergen Reactions  . Cephalexin     Other reaction(s): Unknown  . Hctz  [Hydrochlorothiazide]     Other reaction(s): Other (See Comments) hyponatremia  . Hydralazine   . Penicillins Hives    Has patient had a PCN reaction causing immediate rash, facial/tongue/throat swelling, SOB or lightheadedness with hypotension: Yes Has patient had a PCN reaction causing severe rash involving mucus membranes or skin necrosis: Yes Has patient had a PCN reaction  that required hospitalization: Unknown Has patient had a PCN reaction occurring within the last 10 years: No If all of the above answers are "NO", then may proceed with Cephalosporin use.   Marland Kitchen Reserpine   . Zaroxolyn [Metolazone]     Other reaction(s): Unknown    REVIEW OF SYSTEMS:   Review of Systems  Constitutional: Negative for fever and weight loss.  HENT: Negative for congestion, nosebleeds and tinnitus.   Eyes: Negative for blurred vision, double vision and redness.  Respiratory: Positive for shortness of breath. Negative for cough and hemoptysis.    Cardiovascular: Positive for chest pain. Negative for orthopnea, leg swelling and PND.  Gastrointestinal: Negative for abdominal pain, diarrhea, melena, nausea and vomiting.  Genitourinary: Negative for dysuria, hematuria and urgency.  Musculoskeletal: Negative for falls and joint pain.  Neurological: Negative for dizziness, tingling, sensory change, focal weakness, seizures, weakness and headaches.  Endo/Heme/Allergies: Negative for polydipsia. Does not bruise/bleed easily.  Psychiatric/Behavioral: Negative for depression and memory loss. The patient is not nervous/anxious.     MEDICATIONS AT HOME:   Prior to Admission medications   Medication Sig Start Date End Date Taking? Authorizing Provider  aspirin EC 81 MG tablet Take 162 mg by mouth daily as needed.   Yes [provider]  atorvastatin (LIPITOR) 40 MG tablet Take 1 tablet (40 mg total) by mouth daily at 6 PM. 11/04/16  Yes Kasa, Maretta Bees, MD  busPIRone (BUSPAR) 10 MG tablet Take 10 mg by mouth 2 (two) times daily as needed.    Yes [provider]  enalapril (VASOTEC) 10 MG tablet Take 10 mg by mouth 2 (two) times daily. 08/03/17  Yes [provider]  furosemide (LASIX) 80 MG tablet Take 1-2 tablets (80-160 mg total) by mouth daily. 07/20/17  Yes Dustin Flock, MD  gabapentin (NEURONTIN) 800 MG tablet Take 800 mg by mouth 2 (two) times daily. 1 tablet twice daily 01/24/14  Yes [provider]  insulin detemir (LEVEMIR) 100 unit/ml SOLN Inject 30-35 Units into the skin 2 (two) times daily.    Yes [provider]  insulin lispro (HUMALOG) 100 UNIT/ML injection Inject 5 Units into the skin daily as needed for high blood sugar.    Yes [provider]  lamoTRIgine (LAMICTAL) 150 MG tablet Take 150 mg by mouth 2 (two) times daily.  07/02/17  Yes [provider]  metFORMIN (GLUCOPHAGE-XR) 500 MG 24 hr tablet Take 1,000 mg by mouth 2 (two) times daily.  01/24/14  Yes [provider]  metoprolol (LOPRESSOR) 100 MG tablet Take 100 mg by mouth 2 (two) times daily. 100 mg twice daily   Yes [provider]  omeprazole (PRILOSEC) 20 MG capsule Take 20 mg by mouth daily. Once daily   Yes [provider]  oxyCODONE-acetaminophen (PERCOCET) 10-325 MG tablet Take 1 tablet by mouth 4 (four) times daily as needed for pain.    Yes [provider]  rivaroxaban (XARELTO) 20 MG TABS tablet Take 20 mg by mouth daily.   Yes [provider]  carbamazepine (TEGRETOL) 200 MG tablet Take 2 tablets (400 mg total) by mouth daily at 10 pm. Patient not taking: Reported on 07/17/2017 11/04/16   Flora Lipps, MD  collagenase (SANTYL) ointment Apply topically daily. 07/20/17   Dustin Flock, MD      VITAL SIGNS:  Blood pressure (!) 150/87, pulse (!) 108, temperature 98.2 F (36.8 C), temperature source Oral, resp. rate 16, height _0  (1.88 m), weight (!) 138.3 kg (  305 lb), SpO2 100 %.  PHYSICAL EXAMINATION:  Physical Exam  GENERAL:  57 y.o.-year-old obese patient lying in the bed in no acute distress.  EYES: Pupils equal, round, reactive to light and accommodation. No scleral icterus. Extraocular muscles intact.  HEENT: Head atraumatic, normocephalic. Oropharynx and nasopharynx clear. No oropharyngeal erythema, moist oral mucosa  NECK:  Supple, no jugular venous distention. No thyroid enlargement, no tenderness. + Trach in place.  LUNGS: Normal breath sounds bilaterally, no wheezing, rales, rhonchi. No use of accessory muscles of respiration.  CARDIOVASCULAR: S1, S2 RRR. No murmurs, rubs, gallops, clicks.  ABDOMEN: Soft, nontender, nondistended. Bowel sounds present. No organomegaly or mass.  EXTREMITIES: +2 edema b/l with legs wrapped in UNNA boots, No cyanosis, or clubbing. + 2 pedal & radial pulses b/l.   NEUROLOGIC: Cranial nerves II through XII are intact. No focal Motor or sensory deficits appreciated b/l PSYCHIATRIC: The patient is alert and oriented  x 3.  SKIN: No obvious rash, lesion, or ulcer.   LABORATORY PANEL:   CBC Recent Labs  Lab 08/27/17 1636  WBC 10.3  HGB 13.4  HCT 39.1*  PLT 237   ------------------------------------------------------------------------------------------------------------------  Chemistries  Recent Labs  Lab 08/27/17 1636  NA 117*  K 4.9  CL 83*  CO2 22  GLUCOSE 119*  BUN 12  CREATININE 0.60*  CALCIUM 8.9   ------------------------------------------------------------------------------------------------------------------  Cardiac Enzymes Recent Labs  Lab 08/27/17 1636  TROPONINI <0.03   ------------------------------------------------------------------------------------------------------------------  RADIOLOGY:  Dg Chest 2 View  Result Date: 08/27/2017 CLINICAL DATA:  Worsening chest pain over the past 2 hours. EXAM: CHEST - 2 VIEW COMPARISON:  07/22/2017 FINDINGS: Stable cardiomegaly and mediastinal contours. Tracheostomy tube tip terminates over the superior mediastinum, centered over the upper trachea. No pulmonary consolidation, effusion or pneumothorax. The visualized skeletal structures are unremarkable. IMPRESSION: No active cardiopulmonary disease.  Stable cardiomegaly. Electronically Signed   By: Ashley Royalty M.D.   On: 08/27/2017 16:59     IMPRESSION AND PLAN:   57 year old male with past medical history of morbid obesity, obstructive sleep apnea, diastolic CHF, diabetes, peripheral neuropathy, previous history of hyponatremia,history of paroxysmal atrial fibrillation/flutter, history of pulmonary embolism who presents to the hospital due to chest pain, shortness of breath and also noted to be hyponatremic.  1. Acute hyponatremia-patient's sodium level is 117, and his sodium on his previous hospitalization was normal. -Etiology of the hyponatremia is unclear. We'll hold the patient's diuretics for now. Gently hydrate with IV fluids, follow sodium level. I will get nephrology  consult. -Check serum, urine osmolality.  2. Chest pain/shortness of breath-patient is not hypoxic, his EKG shows A. Fib which is chronic for him. He is not hypoxic with his O2 sats being 100% on room air. I suspect this is likely musculoskeletal in nature. His first set of cardiac markers are negative. -Continue supportive care, if not improving consider cardiology consult and further workup. Patient is followed by Dr. Clayborn Bigness  3. Atrial fibrillation/flutter-patient's rates are not uncontrolled. -Continue metoprolol, continue Xarelto.  4. Diabetes type 2 without complication-continue Levemir, sliding scale insulin, metformin. Follow blood sugars.  5. Hx of Chronic diastolic CHF - clinically patient is not in congestive heart failure, continue metoprolol, hold Lasix given the hyponatremia.  6. Diabetic neuropathy-continue gabapentin.  7. Anxiety/depression-continue BuSpar, Lamictal.  8. Hyperlipidemia - cont. Atorvastatin.   9. GERD - cont. Protonix.     All the records are reviewed and case discussed with ED provider. Management plans discussed with the patient, family and they  are in agreement.  CODE STATUS: Full code  TOTAL TIME TAKING CARE OF THIS PATIENT: 45 minutes.    Henreitta Leber M.D on 08/27/2017 at 6:02 PM  Between 7am to 6pm - Pager - [phone]  After 6pm go to [email or web address] - password [email or web address] [facility]  [place] [facility]  Office  [phone]  CC: Primary care physician; Denton Lank, MD

## 2017-08-27 NOTE — ED Notes (Signed)
Dr Irven Baltimoresannani at bedside

## 2017-08-27 NOTE — ED Provider Notes (Addendum)
[facility] Emergency Department Provider Note   ____________________________________________   First MD Initiated Contact with Patient 08/27/17 1629     (approximate)  I have reviewed the triage vital signs and the nursing notes.   HISTORY  Chief Complaint Chest Pain and Shortness of Breath    HPI [name] is a 57 y.o. male Who reports he's been having chest pain at home since last night. It got worse in the last 2 hours. It's tight pressure-like pain. He was supposed to have a polyp taken out of his bronchial last week at [facility] but they canceled it because his sodium was low. Review the old record shows sodium was 127. His posterior done next week if his sodium comes up. Patient's been in a flutter for several weeks.   Past Medical History:  Diagnosis Date  . CHF (congestive heart failure) ([place])   . Diabetes mellitus ([place])   . Diabetic peripheral neuropathy ([place])   . Elevated liver enzymes    [facility]ty liver [facility]  . GERD (gastroesophageal reflux disease)   . History of pulmonary embolus (PE)   . Hyperlipidemia   . Hypertension   . Myocardial infarct ([place])   . OSA (obstructive sleep apnea)   . 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 Renal insufficiency     Patient Active Problem List   Diagnosis Date Noted  . RUQ pain   . Atrial fibrillation ([place]) 11/02/2016  . Chest pain 11/02/2016  . Hyponatremia 11/02/2016  . Chronic respiratory failure, unspecified whether with hypoxia or hypercapnia ([place]) 05/16/2014  . Ventilator dependence ([place]) 05/16/2014  . Obesity hypoventilation syndrome ([place]) 05/16/2014  . 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 CARDIAC SURGERY     With stent placement  . FLEXIBLE BRONCHOSCOPY N/A 07/28/2017   Procedure: FLEXIBLE BRONCHOSCOPY;  Surgeon: [name], MD;  Location: [facility] ORS;  Service: Pulmonary;  Laterality:  N/A;  . TRACHEOSTOMY  2007  . VASECTOMY      Prior to Admission medications   Medication Sig Start Date End Date Taking? Authorizing Provider  atorvastatin (LIPITOR) 40 MG tablet Take 1 tablet (40 mg total) by mouth daily at 6 PM. 11/04/16   Flora Lipps, MD  busPIRone (BUSPAR) 10 MG tablet Take 20 mg by mouth 3 (three) times daily as needed.    [provider]  carbamazepine (TEGRETOL) 200 MG tablet Take 2 tablets (400 mg total) by mouth daily at 10 pm. Patient not taking: Reported on 07/17/2017 11/04/16   Flora Lipps, MD  collagenase (SANTYL) ointment Apply topically daily. 07/20/17   Dustin Flock, MD  ELIQUIS 5 MG TABS tablet Take 1 tablet (5 mg total) by mouth 2 (two) times daily. 07/20/17   Dustin Flock, MD  furosemide (LASIX) 80 MG tablet Take 1-2 tablets (80-160 mg total) by mouth daily. 07/20/17   Dustin Flock, MD  gabapentin (NEURONTIN) 800 MG tablet Take 800 mg by mouth 2 (two) times daily. 1 tablet twice daily 01/24/14   [provider]  insulin detemir (LEVEMIR) 100 unit/ml SOLN Inject 30-35 Units into the skin 2 (two) times daily.     [provider]  insulin lispro (HUMALOG) 100 UNIT/ML injection Inject 15 Units into the skin 3 (three) times daily with meals.     [provider]  lamoTRIgine (LAMICTAL) 25 MG tablet Take 125 mg by mouth 2 (two) times daily. 07/02/17  [provider]  metFORMIN (GLUCOPHAGE-XR) 500 MG 24 hr tablet Take 1,000 mg by mouth 2 (two) times daily.  01/24/14   [provider]  metoprolol (LOPRESSOR) 100 MG tablet Take by mouth. 100 mg twice daily    [provider]  omeprazole (PRILOSEC) 20 MG capsule Take 20 mg by mouth daily. Once daily    [provider]  oxyCODONE-acetaminophen (PERCOCET) 10-325 MG tablet Take 1 tablet by mouth 4 (four) times daily as needed for pain.     [provider]  venlafaxine XR (EFFEXOR-XR) 150 MG 24 hr capsule Take 1 capsule (150 mg total) by mouth  daily with breakfast. Once daily Patient not taking: Reported on 04/09/2017 11/04/16   Flora Lipps, MD    Allergies Cephalexin; Hctz  [hydrochlorothiazide]; Hydralazine; Penicillins; Reserpine; and Zaroxolyn [metolazone]  Family History  Problem Relation Age of Onset  . Heart attack Mother   . Coronary artery disease Father   . Heart attack Father   . Kidney cancer Neg Hx   . Kidney disease Neg Hx   . Prostate cancer Neg Hx     Social History Social History   Tobacco Use  . Smoking status: Never Smoker  . Smokeless tobacco: Never Used  Substance Use Topics  . Alcohol use: Yes    Alcohol/week: 3.6 - 4.2 oz    Types: 6 - 7 Cans of beer per week  . Drug use: No    Review of Systems  Constitutional: No fever/chills Eyes: No visual changes. ENT: No sore throat. Cardiovascular: see history of present illness Respiratorypatient has shortness of breath with his chest discomfort Gastrointestinal: No abdominal pain.  No nausea, no vomiting.  No diarrhea.  No constipation. Genitourinary: Negative for dysuria. Musculoskeletal: Negative for back pain. Skin: Negative for rash. Neurological: Negative for headaches, focal weakness   ____________________________________________   PHYSICAL EXAM:  VITAL SIGNS: ED Triage Vitals  Enc Vitals Group     BP 08/27/17 1629 (!) 146/103     Pulse Rate 08/27/17 1629 (!) 108     Resp 08/27/17 1629 (!) 22     Temp 08/27/17 1629 98.2 F (36.8 C)     Temp Source 08/27/17 1629 Oral     SpO2 08/27/17 1629 100 %     Weight 08/27/17 1632 (!) 305 lb (138.3 kg)     Height 08/27/17 1632 _0  (1.88 m)     Head Circumference --      Peak Flow --      Pain Score 08/27/17 1631 8     Pain Loc --      Pain Edu? --      Excl. in [place]? --     Constitutional: Alert and oriented. Well appearing and in no acute distress. Eyes: Conjunctivae are normal.  Head: Atraumatic. Nose: No congestion/rhinnorhea. Mouth/Throat: Mucous membranes are moist.   Oropharynx non-erythematous. Neck: No stridor.trach in place Cardiovascular: Normal rate, regular rhythm. Grossly normal heart sounds.  Good peripheral circulation. Respiratory: Normal respiratory effort.  No retractions. Lungs CTAB. Gastrointestinal: Soft and nontender. No distention. No abdominal bruits. No CVA tenderness. Musculoskeletal: No lower extremity tendernessAt least one plus edema.  No joint effusions. Neurologic:  Normal speech and language. No gross focal neurologic deficits are appreciated. No gait instability. Skin:  Skin is warm, dry and intact. No rash noted. Psychiatric: Mood and affect are normal. Speech and behavior are normal.  ____________________________________________   LABS (all labs ordered are listed, but only abnormal results are displayed)  Labs Reviewed  BASIC METABOLIC PANEL - Abnormal; Notable for the following components:      Result Value   Sodium 117 (*)    Chloride 83 (*)    Glucose, Bld 119 (*)    Creatinine, Ser 0.60 (*)    All other components within normal limits  CBC - Abnormal; Notable for the following components:   RBC 4.31 (*)    HCT 39.1 (*)    All other components within normal limits  TROPONIN I  BRAIN NATRIURETIC PEPTIDE   ____________________________________________  EKG  EKG read and interpreted by me shows atrial flutter at a rate of 108 normal axis no acute ST-T wave changes are seen ____________________________________________  RADIOLOGY  ED MD interpretation:  chest x-ray reviewed are not sure if there is not some element of congestive failure going on.  Official radiology report(s): Dg Chest 2 View  Result Date: 08/27/2017 CLINICAL DATA:  Worsening chest pain over the past 2 hours. EXAM: CHEST - 2 VIEW COMPARISON:  07/22/2017 FINDINGS: Stable cardiomegaly and mediastinal contours. Tracheostomy tube tip terminates over the superior mediastinum, centered over the upper trachea. No pulmonary consolidation, effusion or  pneumothorax. The visualized skeletal structures are unremarkable. IMPRESSION: No active cardiopulmonary disease.  Stable cardiomegaly. Electronically Signed   By: Ashley Royalty M.D.   On: 08/27/2017 16:59    ____________________________________________   PROCEDURES  Procedure(s) performed:  Procedures    ____________________________________________   INITIAL IMPRESSION / ASSESSMENT AND PLAN / ED COURSE  review the old records and labs shows patient's sodium is dropped significantly to 117. I believe this will merit admission.  Added BNP to check and see if he could be a congestive failure as I suspect. He does definitely have edema. Possibly working on this extra fluid will bring down his sodium level.        ____________________________________________   FINAL CLINICAL IMPRESSION(S) / ED DIAGNOSES  Final diagnoses:  Hyponatremia  Chest pain, unspecified type     ED Discharge Orders    None       Note:  This document was prepared using Dragon voice recognition software and may include unintentional dictation errors.    Nena Polio, MD 08/27/17 1721 patient still complaining of chest pain even after admission nurse went to didn't do another EKG this 1 the computer is reading as acute MI shows sinus tach with PR segment depression inferiorly and what looks like ST segment elevation in II, III, and F. Dr. call was on-call for cardiology and for acute MI I have contacted him and sent him copies of EKG. We will await his decision   Nena Polio, MD 08/27/17 Bosie Helper discussed with Dr. call wood who came in to look at the EKGs himself and Dr. Liberty Handy he again. We will not take him to the catheter lab or give him heparin at this point.   Nena Polio, MD 08/27/17 [phone]

## 2017-08-27 NOTE — ED Notes (Signed)
Pt reports continuing chest discomfort and asking for medication, no obvious outward sign of distress noted, pt does state that he is getting a little nervous regarding his pain

## 2017-08-27 NOTE — ED Notes (Signed)
Pt assisted to use urinal, pt requesting something for his chest pain, no distress noted at this time, cont to monitor

## 2017-08-27 NOTE — Consult Note (Signed)
Name: [name] MRN: [id number] DOB: 1960/12/29    ADMISSION DATE:  08/27/2017 CONSULTATION DATE: 08/27/2017  REFERRING MD : Dr. Verdell Carmine   CHIEF COMPLAINT: Chest Pain and Shortness of Breath   BRIEF PATIENT DESCRIPTION:  57 yo male with chronic tracheostomy admitted with acute on chronic hyponatremia secondary to diuretic use and ETOH abuse, angina, and acute on chronic respiratory failure requiring transfer to the stepdown unit for chronic nocturnal ventilation via tracheostomy  SIGNIFICANT EVENTS  03/6-Pt admitted to stepdown unit   STUDIES:  None   HISTORY OF PRESENT ILLNESS:  This is a 57 yo male with a PMH of Chronic Tracheostomy (Mechanical Ventilation qhs), Renal Insufficiency, Pancreatitis, OSA, MI, HTN, Hyperlipidemia, Pulmonary Embolism, GERD, Fatty Liver Disease, Diabetic Peripheral Neuropathy, Diabetes Mellitus, Atrial Fibrillation on Eliquis, and CHF.  He presented to [facility] ER via EMS 03/6  with c/o chest pain and shortness of breath onset of symptoms today. Upon arrival to the ER EKG revealed pt in atrial fibrillation/flutter, however heart rte not significantly elevated. Lab results revealed Na+ 117, troponin negative, BNP 134, and CXR negative.  He does endorse he has had poor po intake over the last 2-3 days.  He states he was scheduled for removal of a polyp on his trachea last week, however due to hyponatremia the procedure was canceled. He was subsequently admitted to the stepdown unit due to requirement of nocturnal ventilation via cuffless trach, however he is on RA during the day.   PAST MEDICAL HISTORY :   has a past medical history of CHF (congestive heart failure) ([place]), Diabetes mellitus ([place]), Diabetic peripheral neuropathy ([place]), Elevated liver enzymes, GERD (gastroesophageal reflux disease), History of pulmonary embolus (PE), Hyperlipidemia, Hypertension, Myocardial infarct ([place]), OSA (obstructive sleep apnea), Pancreatitis, and Renal insufficiency.  has a  past surgical history that includes Vasectomy; Tracheostomy (2007); Cardiac surgery; and Flexible bronchoscopy (N/A, 07/28/2017). Prior to Admission medications   Medication Sig Start Date End Date Taking? Authorizing Provider  aspirin EC 81 MG tablet Take 162 mg by mouth daily as needed.   Yes [provider]  atorvastatin (LIPITOR) 40 MG tablet Take 1 tablet (40 mg total) by mouth daily at 6 PM. 11/04/16  Yes Kasa, Maretta Bees, MD  busPIRone (BUSPAR) 10 MG tablet Take 10 mg by mouth 2 (two) times daily as needed.    Yes [provider]  enalapril (VASOTEC) 10 MG tablet Take 10 mg by mouth 2 (two) times daily. 08/03/17  Yes [provider]  furosemide (LASIX) 80 MG tablet Take 1-2 tablets (80-160 mg total) by mouth daily. 07/20/17  Yes Dustin Flock, MD  gabapentin (NEURONTIN) 800 MG tablet Take 800 mg by mouth 2 (two) times daily. 1 tablet twice daily 01/24/14  Yes [provider]  insulin detemir (LEVEMIR) 100 unit/ml SOLN Inject 30-35 Units into the skin 2 (two) times daily.    Yes [provider]  insulin lispro (HUMALOG) 100 UNIT/ML injection Inject 5 Units into the skin daily as needed for high blood sugar.    Yes [provider]  lamoTRIgine (LAMICTAL) 150 MG tablet Take 150 mg by mouth 2 (two) times daily.  07/02/17  Yes [provider]  metFORMIN (GLUCOPHAGE-XR) 500 MG 24 hr tablet Take 1,000 mg by mouth 2 (two) times daily.  01/24/14  Yes [provider]  metoprolol (LOPRESSOR) 100 MG tablet Take 100 mg by mouth 2 (two) times daily. 100 mg twice daily   Yes [provider]  omeprazole (PRILOSEC) 20  MG capsule Take 20 mg by mouth daily. Once daily   Yes [provider]  oxyCODONE-acetaminophen (PERCOCET) 10-325 MG tablet Take 1 tablet by mouth 4 (four) times daily as needed for pain.    Yes [provider]  rivaroxaban (XARELTO) 20 MG TABS tablet Take 20 mg by mouth daily.   Yes [provider]    carbamazepine (TEGRETOL) 200 MG tablet Take 2 tablets (400 mg total) by mouth daily at 10 pm. Patient not taking: Reported on 07/17/2017 11/04/16   Flora Lipps, MD  collagenase (SANTYL) ointment Apply topically daily. 07/20/17   Dustin Flock, MD   Allergies  Allergen Reactions  . Cephalexin     Other reaction(s): Unknown  . Hctz  [Hydrochlorothiazide]     Other reaction(s): Other (See Comments) hyponatremia  . Hydralazine   . Penicillins Hives    Has patient had a PCN reaction causing immediate rash, facial/tongue/throat swelling, SOB or lightheadedness with hypotension: Yes Has patient had a PCN reaction causing severe rash involving mucus membranes or skin necrosis: Yes Has patient had a PCN reaction that required hospitalization: Unknown Has patient had a PCN reaction occurring within the last 10 years: No If all of the above answers are "NO", then may proceed with Cephalosporin use.   Marland Kitchen Reserpine   . Zaroxolyn [Metolazone]     Other reaction(s): Unknown    FAMILY HISTORY:  family history includes Coronary artery disease in his father; Heart attack in his father and mother. SOCIAL HISTORY:  reports that  has never smoked. he has never used smokeless tobacco. He reports that he drinks about 3.6 - 4.2 oz of alcohol per week. He reports that he does not use drugs.  REVIEW OF SYSTEMS: Positives in BOLD  Constitutional: Negative for fever, chills, weight loss, malaise/fatigue and diaphoresis.  HENT: Negative for hearing loss, ear pain, nosebleeds, congestion, sore throat, neck pain, tinnitus and ear discharge.   Eyes: Negative for blurred vision, double vision, photophobia, pain, discharge and redness.  Respiratory: cough, hemoptysis, sputum production, shortness of breath, wheezing and stridor.   Cardiovascular: chest pain, palpitations, orthopnea, claudication, leg swelling and PND.  Gastrointestinal: Negative for heartburn, nausea, vomiting, abdominal pain, diarrhea,  constipation, blood in stool and melena.  Genitourinary: Negative for dysuria, urgency, frequency, hematuria and flank pain.  Musculoskeletal: Negative for myalgias, back pain, joint pain and falls.  Skin: Negative for itching and rash.  Neurological: Negative for dizziness, tingling, tremors, sensory change, speech change, focal weakness, seizures, loss of consciousness, weakness and headaches.  Endo/Heme/Allergies: Negative for environmental allergies and polydipsia. Does not bruise/bleed easily.  SUBJECTIVE:  No complaints at time   VITAL SIGNS: Temp:  [98.2 F (36.8 C)] 98.2 F (36.8 C) (03/06 1629) Pulse Rate:  [103-108] 105 (03/06 1900) Resp:  [10-26] 10 (03/06 1900) BP: (136-156)/(67-109) 153/95 (03/06 1900) SpO2:  [98 %-100 %] 99 % (03/06 1900) Weight:  [138.3 kg (305 lb)] 138.3 kg (305 lb) (03/06 1632)  PHYSICAL EXAMINATION: General: well developed, well nourished male, NAD  Neuro: alert and oriented, follows commands  HEENT: supple, no JVD Cardiovascular: irregular irregular, rate controlled, no M/R/G Lungs: clear throughout, even, non labored, 8 shiley uncuffed  Abdomen: +BS x4, soft, obese, non tender, non distended Musculoskeletal: 3+ bilateral lower extremity edema currently wrapped dressings dry and intact  Skin: bilateral lower extremities una boots in place   Recent Labs  Lab 08/27/17 1636  NA 117*  K 4.9  CL 83*  CO2 22  BUN 12  CREATININE 0.60*  GLUCOSE 119*   Recent Labs  Lab 08/27/17 1636  HGB 13.4  HCT 39.1*  WBC 10.3  PLT 237   Dg Chest 2 View  Result Date: 08/27/2017 CLINICAL DATA:  Worsening chest pain over the past 2 hours. EXAM: CHEST - 2 VIEW COMPARISON:  07/22/2017 FINDINGS: Stable cardiomegaly and mediastinal contours. Tracheostomy tube tip terminates over the superior mediastinum, centered over the upper trachea. No pulmonary consolidation, effusion or pneumothorax. The visualized skeletal structures are unremarkable. IMPRESSION: No  active cardiopulmonary disease.  Stable cardiomegaly. Electronically Signed   By: Ashley Royalty M.D.   On: 08/27/2017 16:59    ASSESSMENT / PLAN: Chronic Tracheostomy Acute on chronic hyponatremia secondary to diuretic use and ETOH abuse Chronic Atrial Fibrillation  Angina  Hx: OSA, CHF, Diabetes Mellitus, HTN, Obesity, Hyperlipidemia  P: Chronic nocturnal ventilation via cuffless tracheostomy-ok to use pts home vent  Prn CXR  Continuous telemetry monitoring Trend troponin's  Cardiology, Nephrology, and Wound Care consulted appreciate input Hold outpatient lasix for now continue all other outpatient cardiac medications  Xarelto for VTE prophylaxis dosing per pharmacy  Trend CBC Monitor for s/sx of bleeding Transfuse for hgb <7 Trend Na levels, check serum osmolality, and urine osmolality  Trend BMP Replace electrolytes as indicated  Monitor UOP  SSI CIWA protocol  Prn morphine for dyspnea and/or pain   Marda Stalker, [place] Pager [phone] (please enter 7 digits) PCCM Consult Pager [phone] (please enter 7 digits)

## 2017-08-27 NOTE — ED Notes (Signed)
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

## 2017-08-27 NOTE — ED Notes (Signed)
Dr Darnelle Catalanmalinda aware of pt's sodium level. Pt states that if he is admitted he will need icu because he sleeps with a ventilator

## 2017-08-27 NOTE — Progress Notes (Signed)
Pt. Having ongoing chest pain and does have hx of CAD with stent placement.   1st Trop is (-) and will cycle markers. Cardiology consult obtained and discussed with Dr. Juliann Paresallwood who will see pt.     Pt. Going to STEP down as he uses Vent to sleep with.  Dr. Belia HemanKasa has been notified.

## 2017-08-27 NOTE — ED Notes (Signed)
Dr callwood at bedside.  

## 2017-08-27 NOTE — ED Notes (Signed)
Pt back from x-ray, dr Darnelle Catalanmalinda at bedside

## 2017-08-27 NOTE — ED Notes (Signed)
Pt given phone to call relatives

## 2017-08-27 NOTE — ED Notes (Signed)
Repeat ekg's performed due to pt reporting chest pain, ekg's given to Dr Darnelle CatalanMalinda. Dr Irven BaltimoreSannani aware and [facility] spoken to by Dr Darnelle CatalanMalinda

## 2017-08-28 LAB — CBC
HCT: 37.2 % — ABNORMAL LOW (ref 40.0–52.0)
Hemoglobin: 12.8 g/dL — ABNORMAL LOW (ref 13.0–18.0)
MCH: 31 pg (ref 26.0–34.0)
MCHC: 34.3 g/dL (ref 32.0–36.0)
MCV: 90.3 fL (ref 80.0–100.0)
PLATELETS: 219 10*3/uL (ref 150–440)
RBC: 4.12 MIL/uL — AB (ref 4.40–5.90)
RDW: 13 % (ref 11.5–14.5)
WBC: 7.3 10*3/uL (ref 3.8–10.6)

## 2017-08-28 LAB — BASIC METABOLIC PANEL
Anion gap: 7 (ref 5–15)
BUN: 12 mg/dL (ref 6–20)
CALCIUM: 8.8 mg/dL — AB (ref 8.9–10.3)
CHLORIDE: 88 mmol/L — AB (ref 101–111)
CO2: 26 mmol/L (ref 22–32)
CREATININE: 0.62 mg/dL (ref 0.61–1.24)
GFR calc non Af Amer: >60 mL/min (ref >60)
GLUCOSE: 118 mg/dL — AB (ref 65–99)
Potassium: 5 mmol/L (ref 3.5–5.1)
Sodium: 121 mmol/L — ABNORMAL LOW (ref 135–145)

## 2017-08-28 LAB — OSMOLALITY
OSMOLALITY: 256 mosm/kg — AB (ref 275–295)
OSMOLALITY: 259 mosm/kg — AB (ref 275–295)

## 2017-08-28 LAB — SODIUM, URINE, RANDOM: Sodium, Ur: <10 mmol/L

## 2017-08-28 LAB — TSH: TSH: 1.058 u[IU]/mL (ref 0.350–4.500)

## 2017-08-28 LAB — GLUCOSE, CAPILLARY
GLUCOSE-CAPILLARY: 114 mg/dL — AB (ref 65–99)
GLUCOSE-CAPILLARY: 151 mg/dL — AB (ref 65–99)
GLUCOSE-CAPILLARY: 108 mg/dL — AB (ref 65–99)
Glucose-Capillary: 137 mg/dL — ABNORMAL HIGH (ref 65–99)

## 2017-08-28 LAB — TROPONIN I: Troponin I: <0.03 ng/mL (ref <0.03)

## 2017-08-28 LAB — CORTISOL: Cortisol, Plasma: 12.6 ug/dL

## 2017-08-28 LAB — OSMOLALITY, URINE: Osmolality, Ur: 151 mOsm/kg — ABNORMAL LOW (ref 300–900)

## 2017-08-28 LAB — URIC ACID: Uric Acid, Serum: 6.8 mg/dL (ref 4.4–7.6)

## 2017-08-28 MED ORDER — POLYETHYLENE GLYCOL 3350 17 G PO PACK
17.0000 g | PACK | Freq: Every day | ORAL | Status: DC | PRN
  Administered 2017-08-28: 17 g via ORAL
  Filled 2017-08-28: qty 1

## 2017-08-28 MED ORDER — SENNOSIDES-DOCUSATE SODIUM 8.6-50 MG PO TABS
2.0000 | ORAL_TABLET | Freq: Two times a day (BID) | ORAL | Status: DC
  Administered 2017-08-28 – 2017-09-02 (×9): 2 via ORAL
  Filled 2017-08-28 (×10): qty 2

## 2017-08-28 NOTE — Consult Note (Signed)
[facility] CONSULT NOTE    Date: 08/28/2017                  Patient Name:  [name]  MRN: [id number]  DOB: [birth year]  Age / Sex: 57 y.o., male         PCP: Denton Lank, MD                 Service Requesting Consult: Hospitalist                 Reason for Consult: Hyponatremia            History of Present Illness: Patient is a 58 y.o. male with a PMHx of congestive heart failure with normal ejection fraction, paroxysmal atrial fibrillation, chronic respiratory failure status post tracheostomy placement with nocturnal ventilation, history of pulmonary embolism, obstructive sleep apnea, diabetes mellitus type 2, peripheral neuropathy, elevated liver enzymes, GERD, hypertension, history of pancreatitis who was admitted to [facility] on 08/27/2017 for evaluation of chest pain and shortness of breath.  Patient states that he had related palpitations as well.  When he arrived here labs were checked.  Serum sodium was low at 117.  Patient reports that he has been on Lasix at home.  In addition he drinks 3-5 beers per day.  Patient was started on 0.9 normal saline and serum sodium is up to 122 at the moment.   Medications: Outpatient medications: Medications Prior to Admission  Medication Sig Dispense Refill Last Dose  . aspirin EC 81 MG tablet Take 162 mg by mouth daily as needed.   08/27/2017 at 0800  . atorvastatin (LIPITOR) 40 MG tablet Take 1 tablet (40 mg total) by mouth daily at 6 PM. 30 tablet 1 08/26/2017 at 1800  . busPIRone (BUSPAR) 10 MG tablet Take 10 mg by mouth 2 (two) times daily as needed.    08/27/2017 at 0800  . enalapril (VASOTEC) 10 MG tablet Take 10 mg by mouth 2 (two) times daily.   08/27/2017 at 0800  . furosemide (LASIX) 80 MG tablet Take 1-2 tablets (80-160 mg total) by mouth daily.   08/26/2017 at 0800  . gabapentin (NEURONTIN) 800 MG tablet Take 800 mg by mouth 2 (two) times daily. 1 tablet twice daily   08/27/2017 at 0800  . insulin detemir (LEVEMIR)  100 unit/ml SOLN Inject 30-35 Units into the skin 2 (two) times daily.    08/26/2017 at 2000  . insulin lispro (HUMALOG) 100 UNIT/ML injection Inject 5 Units into the skin daily as needed for high blood sugar.    prn at prn  . lamoTRIgine (LAMICTAL) 150 MG tablet Take 150 mg by mouth 2 (two) times daily.    08/27/2017 at 0800  . metFORMIN (GLUCOPHAGE-XR) 500 MG 24 hr tablet Take 1,000 mg by mouth 2 (two) times daily.    08/27/2017 at 0800  . metoprolol (LOPRESSOR) 100 MG tablet Take 100 mg by mouth 2 (two) times daily. 100 mg twice daily   08/27/2017 at 0800  . omeprazole (PRILOSEC) 20 MG capsule Take 20 mg by mouth daily. Once daily   08/27/2017 at 0800  . oxyCODONE-acetaminophen (PERCOCET) 10-325 MG tablet Take 1 tablet by mouth 4 (four) times daily as needed for pain.    prn at prn  . rivaroxaban (XARELTO) 20 MG TABS tablet Take 20 mg by mouth daily.   08/27/2017 at 0800  . carbamazepine (TEGRETOL) 200 MG tablet Take 2 tablets (400 mg total) by mouth daily  at 10 pm. (Patient not taking: Reported on 07/17/2017) 30 tablet 0 Not Taking at Unknown time  . collagenase (SANTYL) ointment Apply topically daily. 15 g 0 07/27/2017 at Unknown time    Current medications: Current Facility-Administered Medications  Medication Dose Route Frequency Provider Last Rate Last Dose  . 0.9 %  sodium chloride infusion   Intravenous Continuous Henreitta Leber, MD 75 mL/hr at 08/28/17 0600    . acetaminophen (TYLENOL) tablet 650 mg  650 mg Oral Q6H PRN Henreitta Leber, MD       Or  . acetaminophen (TYLENOL) suppository 650 mg  650 mg Rectal Q6H PRN Henreitta Leber, MD      . atorvastatin (LIPITOR) tablet 40 mg  40 mg Oral q1800 Henreitta Leber, MD   40 mg at 08/27/17 2140  . busPIRone (BUSPAR) tablet 10 mg  10 mg Oral BID PRN Henreitta Leber, MD   10 mg at 08/28/17 0128  . docusate sodium (COLACE) capsule 100 mg  100 mg Oral BID PRN Awilda Bill, NP   100 mg at 08/27/17 2225  . enalapril (VASOTEC) tablet 10 mg  10 mg  Oral BID Henreitta Leber, MD   10 mg at 08/27/17 2143  . folic acid (FOLVITE) tablet 1 mg  1 mg Oral Daily Awilda Bill, NP   1 mg at 08/27/17 2142  . gabapentin (NEURONTIN) capsule 800 mg  800 mg Oral BID Henreitta Leber, MD   800 mg at 08/27/17 2130  . insulin aspart (novoLOG) injection 0-20 Units  0-20 Units Subcutaneous TID WC Sainani, Vivek J, MD      . insulin aspart (novoLOG) injection 0-5 Units  0-5 Units Subcutaneous QHS Sainani, Vivek J, MD      . insulin detemir (LEVEMIR) injection 30 Units  30 Units Subcutaneous BID Henreitta Leber, MD   30 Units at 08/27/17 2130  . lamoTRIgine (LAMICTAL) tablet 150 mg  150 mg Oral BID Henreitta Leber, MD   150 mg at 08/27/17 2129  . LORazepam (ATIVAN) injection 2-3 mg  2-3 mg Intravenous Q1H PRN Awilda Bill, NP      . metFORMIN (GLUCOPHAGE-XR) 24 hr tablet 1,000 mg  1,000 mg Oral BID WC Henreitta Leber, MD   1,000 mg at 08/27/17 2130  . metoprolol tartrate (LOPRESSOR) tablet 100 mg  100 mg Oral BID Henreitta Leber, MD   100 mg at 08/27/17 2142  . morphine 2 MG/ML injection 1-2 mg  1-2 mg Intravenous Q4H PRN Awilda Bill, NP   2 mg at 08/28/17 0453  . multivitamin with minerals tablet 1 tablet  1 tablet Oral Daily Awilda Bill, NP   1 tablet at 08/27/17 2139  . ondansetron (ZOFRAN) tablet 4 mg  4 mg Oral Q6H PRN Henreitta Leber, MD       Or  . ondansetron (ZOFRAN) injection 4 mg  4 mg Intravenous Q6H PRN Henreitta Leber, MD      . oxyCODONE-acetaminophen (PERCOCET/ROXICET) 5-325 MG per tablet 1 tablet  1 tablet Oral Q6H PRN Henreitta Leber, MD       And  . oxyCODONE (Oxy IR/ROXICODONE) immediate release tablet 5 mg  5 mg Oral Q6H PRN Henreitta Leber, MD      . pantoprazole (PROTONIX) EC tablet 40 mg  40 mg Oral Daily Sainani, Belia Heman, MD      . rivaroxaban (XARELTO) tablet 20 mg  20 mg Oral Q  supper Henreitta Leber, MD      . thiamine (VITAMIN B-1) tablet 100 mg  100 mg Oral Daily Awilda Bill, NP   100 mg at 08/27/17  2141      Allergies: Allergies  Allergen Reactions  . Cephalexin     Other reaction(s): Unknown  . Hctz  [Hydrochlorothiazide]     Other reaction(s): Other (See Comments) hyponatremia  . Hydralazine   . Penicillins Hives    Has patient had a PCN reaction causing immediate rash, facial/tongue/throat swelling, SOB or lightheadedness with hypotension: Yes Has patient had a PCN reaction causing severe rash involving mucus membranes or skin necrosis: Yes Has patient had a PCN reaction that required hospitalization: Unknown Has patient had a PCN reaction occurring within the last 10 years: No If all of the above answers are "NO", then may proceed with Cephalosporin use.   Marland Kitchen Reserpine   . Zaroxolyn [Metolazone]     Other reaction(s): Unknown      Past Medical History: Past Medical History:  Diagnosis Date  . CHF (congestive heart failure) ([place])   . Diabetes mellitus ([place])   . Diabetic peripheral neuropathy ([place])   . Elevated liver enzymes    fatty liver per [facility]  . GERD (gastroesophageal reflux disease)   . History of pulmonary embolus (PE)   . Hyperlipidemia   . Hypertension   . Myocardial infarct ([place])   . OSA (obstructive sleep apnea)   . 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 Renal insufficiency      Past Surgical History: Past Surgical History:  Procedure Laterality Date  . CARDIAC SURGERY     With stent placement  . FLEXIBLE BRONCHOSCOPY N/A 07/28/2017   Procedure: FLEXIBLE BRONCHOSCOPY;  Surgeon: [name], MD;  Location: [facility] ORS;  Service: Pulmonary;  Laterality: N/A;  . TRACHEOSTOMY  2007  . VASECTOMY       Family History: Family History  Problem Relation Age of Onset  . Heart attack Mother   . Coronary artery disease Father   . Heart attack Father   . Kidney cancer Neg Hx   . Kidney disease Neg Hx   . Prostate cancer Neg Hx      Social History: Social History   Socioeconomic History  .  Marital status: Single    Spouse name: Not on file  . Number of children: Not on file  . Years of education: Not on file  . Highest education level: Not on file  Social Needs  . Financial resource strain: Not on file  . Food insecurity - worry: Not on file  . Food insecurity - inability: Not on file  . Transportation needs - medical: Not on file  . Transportation needs - non-medical: Not on file  Occupational History  . Occupation: retired  Tobacco Use  . Smoking status: Never Smoker  . Smokeless tobacco: Never Used  Substance and Sexual Activity  . Alcohol use: Yes    Alcohol/week: 3.6 - 4.2 oz    Types: 6 - 7 Cans of beer per week  . Drug use: No  . Sexual activity: Not on file  Other Topics Concern  . Not on file  Social History Narrative  . Not on file     Review of Systems: Review of Systems  Constitutional: Positive for malaise/fatigue. Negative for chills and fever.  HENT: Negative for hearing loss, nosebleeds and tinnitus.   Eyes: Negative for blurred vision and  double vision.  Respiratory: Positive for shortness of breath. Negative for sputum production.   Cardiovascular: Positive for chest pain, palpitations and leg swelling.  Gastrointestinal: Negative for abdominal pain, heartburn, nausea and vomiting.  Genitourinary: Positive for frequency. Negative for dysuria and urgency.  Musculoskeletal: Negative for joint pain and myalgias.  Skin: Negative for itching and rash.  Neurological: Negative for dizziness and focal weakness.  Endo/Heme/Allergies: Negative for polydipsia. Does not bruise/bleed easily.  Psychiatric/Behavioral: Negative for depression and hallucinations.     Vital Signs: Blood pressure 91/60, pulse 60, temperature 98 F (36.7 C), temperature source Oral, resp. rate 12, height _0  (1.88 m), weight (!) 137.6 kg (303 lb 5.7 oz), SpO2 96 %.  Weight trends: Filed Weights   08/27/17 1632 08/27/17 2015  Weight: (!) 138.3 kg (305 lb) (!) 137.6  kg (303 lb 5.7 oz)    Physical Exam: General: NAD, sitting up in chair  Head: Normocephalic, atraumatic.  Eyes: Anicteric, EOMI  Nose: Mucous membranes moist, not inflammed, nonerythematous.  Throat: Oropharynx nonerythematous, no exudate appreciated.   Neck: Tracheostomy in place  Lungs:  Normal respiratory effort.  Scattered rhonchi  Heart: S1S2 irregular  Abdomen:  BS normoactive. Soft, Nondistended, non-tender.  No masses or organomegaly.  Extremities: Bilateral LE's wrapped, 2+ LE edema  Neurologic: A&O X3, Motor strength is 5/5 in the all 4 extremities  Skin: No visible rashes, scars.    Lab results: Basic Metabolic Panel: Recent Labs  Lab 08/27/17 1636 08/28/17 0421  NA 117* 121*  K 4.9 5.0  CL 83* 88*  CO2 22 26  GLUCOSE 119* 118*  BUN 12 12  CREATININE 0.60* 0.62  CALCIUM 8.9 8.8*    Liver Function Tests: No results for input(s): AST, ALT, ALKPHOS, BILITOT, PROT, ALBUMIN in the last 168 hours. No results for input(s): LIPASE, AMYLASE in the last 168 hours. No results for input(s): AMMONIA in the last 168 hours.  CBC: Recent Labs  Lab 08/27/17 1636 08/28/17 0421  WBC 10.3 7.3  HGB 13.4 12.8*  HCT 39.1* 37.2*  MCV 90.7 90.3  PLT 237 219    Cardiac Enzymes: Recent Labs  Lab 08/27/17 1636 08/27/17 1918 08/27/17 2306 08/28/17 0421  TROPONINI <0.03 <0.03 <0.03 <0.03    BNP: Invalid input(s): POCBNP  CBG: Recent Labs  Lab 08/27/17 2013 08/27/17 2133  GLUCAP 108* 104*    Microbiology: Results for orders placed or performed during the hospital encounter of 08/27/17  MRSA PCR Screening     Status: None   Collection Time: 08/27/17  8:17 PM  Result Value Ref Range Status   MRSA by PCR NEGATIVE NEGATIVE Final    Comment:        The GeneXpert MRSA Assay (FDA approved for NASAL specimens only), is one component of a comprehensive MRSA colonization surveillance program. It is not intended to diagnose MRSA infection nor to guide or monitor  treatment for MRSA infections. Performed at [facility], [address] [place], [place] [address]     Coagulation Studies: No results for input(s): LABPROT, INR in the last 72 hours.  Urinalysis: No results for input(s): COLORURINE, LABSPEC, PHURINE, GLUCOSEU, HGBUR, BILIRUBINUR, KETONESUR, PROTEINUR, UROBILINOGEN, NITRITE, LEUKOCYTESUR in the last 72 hours.  Invalid input(s): APPERANCEUR    Imaging: Dg Chest 2 View  Result Date: 08/27/2017 CLINICAL DATA:  Worsening chest pain over the past 2 hours. EXAM: CHEST - 2 VIEW COMPARISON:  07/22/2017 FINDINGS: Stable cardiomegaly and mediastinal contours. Tracheostomy tube tip terminates over the superior  mediastinum, centered over the upper trachea. No pulmonary consolidation, effusion or pneumothorax. The visualized skeletal structures are unremarkable. IMPRESSION: No active cardiopulmonary disease.  Stable cardiomegaly. Electronically Signed   By: Ashley Royalty M.D.   On: 08/27/2017 16:59      Assessment & Plan: Pt is a 57 y.o. male  with a PMHx of congestive heart failure with normal ejection fraction, paroxysmal atrial fibrillation, chronic respiratory failure status post tracheostomy placement with nocturnal ventilation, history of pulmonary embolism, obstructive sleep apnea, diabetes mellitus type 2, peripheral neuropathy, elevated liver enzymes, GERD, hypertension, history of pancreatitis who was admitted to [facility] on 08/27/2017 for evaluation of chest pain and shortness of breath.   1.  Hyponatremia. 2.  Chronic respiratory failure with tracheostomy and nocturnal ventilation. 3.  Lower extremity edema. 4.  ETOH abuse  Plan:  The patient's hyponatremia appears to be multifactorial.  Hyponatremia likely as result of beer potomania as well as diuretic usage.  Agree with discontinuation of Lasix temporarily.  In addition agree with IV fluid hydration with 0.9 normal saline at 75 cc/h.  Continue to monitor serum sodium as it is up  to 122.  Counseled the patient on limiting alcohol intake as he is currently drinking 3-5 beers per day.  We also counseled the patient on eating a well-balanced diet as he tends to eat only 1 or 2 meals per day.  Further plan as patient progresses.

## 2017-08-28 NOTE — Progress Notes (Signed)
[employer] [facility] [place] [facility]   PATIENT NAME: [name]    MR#:  [id number]  DATE OF BIRTH:  09-25-1960  SUBJECTIVE:  CHIEF COMPLAINT:   Chief Complaint  Patient presents with  . Chest Pain  . Shortness of Breath    REVIEW OF SYSTEMS:  CONSTITUTIONAL: No fever, fatigue or weakness.  EYES: No blurred or double vision.  EARS, NOSE, AND THROAT: No tinnitus or ear pain.  RESPIRATORY: No cough, shortness of breath, wheezing or hemoptysis.  CARDIOVASCULAR: No chest pain, orthopnea, edema.  GASTROINTESTINAL: No nausea, vomiting, diarrhea or abdominal pain.  GENITOURINARY: No dysuria, hematuria.  ENDOCRINE: No polyuria, nocturia,  HEMATOLOGY: No anemia, easy bruising or bleeding SKIN: No rash or lesion. MUSCULOSKELETAL: No joint pain or arthritis.   NEUROLOGIC: No tingling, numbness, weakness.  PSYCHIATRY: No anxiety or depression.   ROS  DRUG ALLERGIES:   Allergies  Allergen Reactions  . Cephalexin     Other reaction(s): Unknown  . Hctz  [Hydrochlorothiazide]     Other reaction(s): Other (See Comments) hyponatremia  . Hydralazine   . Penicillins Hives    Has patient had a PCN reaction causing immediate rash, facial/tongue/throat swelling, SOB or lightheadedness with hypotension: Yes Has patient had a PCN reaction causing severe rash involving mucus membranes or skin necrosis: Yes Has patient had a PCN reaction that required hospitalization: Unknown Has patient had a PCN reaction occurring within the last 10 years: No If all of the above answers are "NO", then may proceed with Cephalosporin use.   Marland Kitchen Reserpine   . Zaroxolyn [Metolazone]     Other reaction(s): Unknown    VITALS:  Blood pressure 120/74, pulse 90, temperature 98.2 F (36.8 C), temperature source Oral, resp. rate 14, height 6\' 2"  (1.88 m), weight (!) 137.6 kg (303 lb 5.7 oz), SpO2 100 %.  PHYSICAL EXAMINATION:  GENERAL:  57 y.o.-year-old patient lying in the bed with no acute  distress.  EYES: Pupils equal, round, reactive to light and accommodation. No scleral icterus. Extraocular muscles intact.  HEENT: Head atraumatic, normocephalic. Oropharynx and nasopharynx clear.  NECK:  Supple, no jugular venous distention. No thyroid enlargement, no tenderness.  LUNGS: Normal breath sounds bilaterally, no wheezing, rales,rhonchi or crepitation. No use of accessory muscles of respiration.  CARDIOVASCULAR: S1, S2 normal. No murmurs, rubs, or gallops.  ABDOMEN: Soft, nontender, nondistended. Bowel sounds present. No organomegaly or mass.  EXTREMITIES: No pedal edema, cyanosis, or clubbing.  NEUROLOGIC: Cranial nerves II through XII are intact. Muscle strength 5/5 in all extremities. Sensation intact. Gait not checked.  PSYCHIATRIC: [name].  SKIN: No obvious rash, lesion, or ulcer.   Physical Exam LABORATORY PANEL:   CBC Recent Labs  Lab 08/28/17 0421  WBC 7.3  HGB 12.8*  HCT 37.2*  PLT 219   ------------------------------------------------------------------------------------------------------------------  Chemistries  Recent Labs  Lab 08/28/17 0421  NA 121*  K 5.0  CL 88*  CO2 26  GLUCOSE 118*  BUN 12  CREATININE 0.62  CALCIUM 8.8*   ------------------------------------------------------------------------------------------------------------------  Cardiac Enzymes Recent Labs  Lab 08/27/17 2306 08/28/17 0421  TROPONINI <0.03 <0.03   ------------------------------------------------------------------------------------------------------------------  RADIOLOGY:  Dg Chest 2 View  Result Date: 08/27/2017 CLINICAL DATA:  Worsening chest pain over the past 2 hours. EXAM: CHEST - 2 VIEW COMPARISON:  07/22/2017 FINDINGS: Stable cardiomegaly and mediastinal contours. Tracheostomy tube tip terminates over the superior mediastinum, centered over the upper trachea. No pulmonary consolidation, effusion or pneumothorax. The visualized  skeletal structures are unremarkable. IMPRESSION: No active cardiopulmonary disease.  Stable cardiomegaly. Electronically Signed   By: Tollie Ethavid  Kwon M.D.   On: 08/27/2017 16:59    ASSESSMENT AND PLAN:  57 year old male with past medical history of morbid obesity, obstructive sleep apnea, diastolic CHF, diabetes, peripheral neuropathy, previous history of hyponatremia,history of paroxysmal atrial fibrillation/flutter, history of pulmonary embolism who presents to the hospital due to chest pain, shortness of breath and also noted to be hyponatremic.  1. 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  Acute chest pain/shortness of breath Stable Continue supplemental oxygen as needed, cardiac enzymes negative for acute coronary syndrome, cardiology consulted for expert opinion  3. Atrial fibrillation/flutter Stable on metoprolol and Xarelto  4.  Chronic diabetes mellitus type 2  Stable on current regiment   5. Hx of Chronic diastolic CHF Stable continue metoprolol, hold Lasix given the hyponatremia  6. Diabetic neuropathy Stable on gabapentin.  7. Anxiety/depression Stable on BuSpar, Lamictal.  8. Hyperlipidemia  Stable on Atorvastatin.   9. GERD, without esophagitis PPI daily  All the records are reviewed and case discussed with Care Management/Social Workerr. Management plans discussed with the patient, family and they are in agreement.  CODE STATUS: full  TOTAL TIME TAKING CARE OF THIS PATIENT: 35 minutes.     POSSIBLE D/C IN 35 DAYS, DEPENDING ON CLINICAL CONDITION.   [name] M.D on 08/28/2017   Between 7am to 6pm - Pager - [phone]  After 6pm go to [email or web address] - password [email or web address]  [facility] [place] [facility]  Office  [phone]  CC: Primary care physician; Hillery AldoPatel, Sarah, MD  Note: This dictation was prepared with Dragon dictation along with smaller phrase technology. Any  transcriptional errors that result from this process are unintentional.

## 2017-08-28 NOTE — Progress Notes (Signed)
Pt concerned about being discharged over the weekend.  The pain management clinic is closed and he has no pain medication at home.

## 2017-08-28 NOTE — Consult Note (Signed)
Reason for Consult: Chest pain shortness of breath Referring Physician: Dr. Verdell Carmine hospitalist Dr. Denton Lank primary Cardiologist Dr. call with  Kanon Novosel is an 57 y.o. male.  HPI: Patient is a 57 year old white male with multiple medical problems diabetes history failure status post permanent tracheostomy history of pulmonary embolus paroxysmal A. fib obstructive sleep apnea complaining of significant chest pain symptoms.. Patient presented with hyponatremia sodium of 117.  Patient having chronic lower extremity edema venous stasis ulcers poor healing been followed at the wound center.  Patient has been admitted for electrolyte correction as well as atypical chest pain  Past Medical History:  Diagnosis Date  . CHF (congestive heart failure) ([place])   . Diabetes mellitus ([place])   . Diabetic peripheral neuropathy ([place])   . Elevated liver enzymes    fatty liver per [facility]  . GERD (gastroesophageal reflux disease)   . History of pulmonary embolus (PE)   . Hyperlipidemia   . Hypertension   . Myocardial infarct ([place])   . OSA (obstructive sleep apnea)   . 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 Renal insufficiency     Past Surgical History:  Procedure Laterality Date  . CARDIAC SURGERY     With stent placement  . FLEXIBLE BRONCHOSCOPY N/A 07/28/2017   Procedure: FLEXIBLE BRONCHOSCOPY;  Surgeon: [name], MD;  Location: [facility] ORS;  Service: Pulmonary;  Laterality: N/A;  . TRACHEOSTOMY  2007  . VASECTOMY      Family History  Problem Relation Age of Onset  . Heart attack Mother   . Coronary artery disease Father   . Heart attack Father   . Kidney cancer Neg Hx   . Kidney disease Neg Hx   . Prostate cancer Neg Hx     Social History:  reports that  has never smoked. he has never used smokeless tobacco. He reports that he drinks about 3.6 - 4.2 oz of alcohol per week. He reports that he does not use drugs.  Allergies:   Allergies  Allergen Reactions  . Cephalexin     Other reaction(s): Unknown  . Hctz  [Hydrochlorothiazide]     Other reaction(s): Other (See Comments) hyponatremia  . Hydralazine   . Penicillins Hives    Has patient had a PCN reaction causing immediate rash, facial/tongue/throat swelling, SOB or lightheadedness with hypotension: Yes Has patient had a PCN reaction causing severe rash involving mucus membranes or skin necrosis: Yes Has patient had a PCN reaction that required hospitalization: Unknown Has patient had a PCN reaction occurring within the last 10 years: No If all of the above answers are "NO", then may proceed with Cephalosporin use.   Marland Kitchen Reserpine   . Zaroxolyn [Metolazone]     Other reaction(s): Unknown    Medications: I have reviewed the patient's current medications.  Results for orders placed or performed during the hospital encounter of 08/27/17 (from the past 48 hour(s))  Basic metabolic panel     Status: Abnormal   Collection Time: 08/27/17  4:36 PM  Result Value Ref Range   Sodium 117 (LL) 135 - 145 mmol/L    Comment: CRITICAL RESULT CALLED TO, READ BACK BY AND VERIFIED WITH BRANDY DAVIS 08/27/17 1705 KLW    Potassium 4.9 3.5 - 5.1 mmol/L   Chloride 83 (L) 101 - 111 mmol/L   CO2 22 22 - 32 mmol/L   Glucose, Bld 119 (H) 65 - 99 mg/dL   BUN 12 6 -  20 mg/dL   Creatinine, Ser 0.60 (L) 0.61 - 1.24 mg/dL   Calcium 8.9 8.9 - 10.3 mg/dL   GFR calc non Af Amer >60 >60 mL/min   GFR calc Af Amer >60 >60 mL/min    Comment: (NOTE) The eGFR has been calculated using the CKD EPI equation. This calculation has not been validated in all clinical situations. eGFR's persistently <60 mL/min signify possible Chronic Kidney Disease.    Anion gap 12 5 - 15    Comment: Performed at [facility], [address] [place], [place] [address]  CBC     Status: Abnormal   Collection Time: 08/27/17  4:36 PM  Result Value Ref Range   WBC 10.3 3.8 - 10.6 K/uL   RBC 4.31  (L) 4.40 - 5.90 MIL/uL   Hemoglobin 13.4 13.0 - 18.0 g/dL   HCT 39.1 (L) 40.0 - 52.0 %   MCV 90.7 80.0 - 100.0 fL   MCH 31.1 26.0 - 34.0 pg   MCHC 34.3 32.0 - 36.0 g/dL   RDW 13.2 11.5 - 14.5 %   Platelets 237 150 - 440 K/uL    Comment: Performed at [facility], [address] [place], [place] [address]  Troponin I     Status: None   Collection Time: 08/27/17  4:36 PM  Result Value Ref Range   Troponin I <0.03 <0.03 ng/mL    Comment: Performed at [facility], [address] [place], [place] [address]  Brain natriuretic peptide     Status: Abnormal   Collection Time: 08/27/17  4:36 PM  Result Value Ref Range   B Natriuretic Peptide 134.0 (H) 0.0 - 100.0 pg/mL    Comment: Performed at [facility], [address] [place], [place] [address]  Troponin I     Status: None   Collection Time: 08/27/17  7:18 PM  Result Value Ref Range   Troponin I <0.03 <0.03 ng/mL    Comment: Performed at [facility], [address] [place], [place] [address]  Osmolality, urine     Status: Abnormal   Collection Time: 08/27/17  7:18 PM  Result Value Ref Range   Osmolality, Ur 154 (L) 300 - 900 mOsm/kg    Comment: Performed at [facility], [address] [place], [place] [address]  Glucose, capillary     Status: Abnormal   Collection Time: 08/27/17  8:13 PM  Result Value Ref Range   Glucose-Capillary 108 (H) 65 - 99 mg/dL  MRSA PCR Screening     Status: None   Collection Time: 08/27/17  8:17 PM  Result Value Ref Range   MRSA by PCR NEGATIVE NEGATIVE    Comment:        The GeneXpert MRSA Assay (FDA approved for NASAL specimens only), is one component of a comprehensive MRSA colonization surveillance program. It is not intended to diagnose MRSA infection nor to guide or monitor treatment for MRSA infections. Performed at [facility], [address] [place], [place] [address]   Glucose, capillary     Status: Abnormal    Collection Time: 08/27/17  9:33 PM  Result Value Ref Range   Glucose-Capillary 104 (H) 65 - 99 mg/dL  Troponin I     Status: None   Collection Time: 08/27/17 11:06 PM  Result Value Ref Range   Troponin I <0.03 <0.03 ng/mL    Comment: Performed at [facility], [address], [place], [place] [address]  Osmolality  Status: Abnormal   Collection Time: 08/27/17 11:06 PM  Result Value Ref Range   Osmolality 256 (L) 275 - 295 mOsm/kg    Comment: Performed at [facility], [address] [place], [place] [address]  Troponin I     Status: None   Collection Time: 08/28/17  4:21 AM  Result Value Ref Range   Troponin I <0.03 <0.03 ng/mL    Comment: Performed at [facility], [address] [place], [place] [address]  Basic metabolic panel     Status: Abnormal   Collection Time: 08/28/17  4:21 AM  Result Value Ref Range   Sodium 121 (L) 135 - 145 mmol/L   Potassium 5.0 3.5 - 5.1 mmol/L   Chloride 88 (L) 101 - 111 mmol/L   CO2 26 22 - 32 mmol/L   Glucose, Bld 118 (H) 65 - 99 mg/dL   BUN 12 6 - 20 mg/dL   Creatinine, Ser 0.62 0.61 - 1.24 mg/dL   Calcium 8.8 (L) 8.9 - 10.3 mg/dL   GFR calc non Af Amer >60 >60 mL/min   GFR calc Af Amer >60 >60 mL/min    Comment: (NOTE) The eGFR has been calculated using the CKD EPI equation. This calculation has not been validated in all clinical situations. eGFR's persistently <60 mL/min signify possible Chronic Kidney Disease.    Anion gap 7 5 - 15    Comment: Performed at [facility], [address] [place], [place] [address]  CBC     Status: Abnormal   Collection Time: 08/28/17  4:21 AM  Result Value Ref Range   WBC 7.3 3.8 - 10.6 K/uL   RBC 4.12 (L) 4.40 - 5.90 MIL/uL   Hemoglobin 12.8 (L) 13.0 - 18.0 g/dL   HCT 37.2 (L) 40.0 - 52.0 %   MCV 90.3 80.0 - 100.0 fL   MCH 31.0 26.0 - 34.0 pg   MCHC 34.3 32.0 - 36.0 g/dL   RDW 13.0 11.5 - 14.5 %   Platelets 219 150 - 440 K/uL    Comment: Performed at  [facility], [address] [place], [place] [address]  Glucose, capillary     Status: Abnormal   Collection Time: 08/28/17  7:51 AM  Result Value Ref Range   Glucose-Capillary 114 (H) 65 - 99 mg/dL   Comment 1 Notify RN   Osmolality, urine     Status: Abnormal   Collection Time: 08/28/17  8:24 AM  Result Value Ref Range   Osmolality, Ur 151 (L) 300 - 900 mOsm/kg    Comment: Performed at [facility], [address] [place], [place] [address]  Sodium, urine, random     Status: None   Collection Time: 08/28/17  8:24 AM  Result Value Ref Range   Sodium, Ur <10 mmol/L    Comment: Performed at [facility], [address] [place], [place] [address]  Osmolality     Status: Abnormal   Collection Time: 08/28/17  8:55 AM  Result Value Ref Range   Osmolality 259 (L) 275 - 295 mOsm/kg    Comment: Performed at [facility], [address] [place], [place] [address]  Cortisol     Status: None   Collection Time: 08/28/17  8:55 AM  Result Value Ref Range   Cortisol, Plasma 12.6 ug/dL    Comment: (NOTE) AM    6.7 - 22.6 ug/dL PM   <10.0       ug/dL Performed at [facility], [place] [address], [place], [place] [address]   TSH     Status: None   Collection Time: 08/28/17  8:55 AM  Result Value Ref Range   TSH 1.058 0.350 - 4.500 uIU/mL    Comment: Performed by a 3rd Generation assay with a functional sensitivity of <=0.01 uIU/mL. Performed at [facility], [address] [place], [place] [address]   Uric acid     Status: None   Collection Time: 08/28/17  8:55 AM  Result Value Ref Range   Uric Acid, Serum 6.8 4.4 - 7.6 mg/dL    Comment: Performed at [facility], [address] [place], [place] [address]  Glucose, capillary     Status: Abnormal   Collection Time: 08/28/17 11:39 AM  Result Value Ref Range   Glucose-Capillary 151 (H) 65 - 99 mg/dL    Dg Chest 2 View  Result Date: 08/27/2017 CLINICAL DATA:  Worsening  chest pain over the past 2 hours. EXAM: CHEST - 2 VIEW COMPARISON:  07/22/2017 FINDINGS: Stable cardiomegaly and mediastinal contours. Tracheostomy tube tip terminates over the superior mediastinum, centered over the upper trachea. No pulmonary consolidation, effusion or pneumothorax. The visualized skeletal structures are unremarkable. IMPRESSION: No active cardiopulmonary disease.  Stable cardiomegaly. Electronically Signed   By: Ashley Royalty M.D.   On: 08/27/2017 16:59    Review of Systems  Constitutional: Positive for diaphoresis and malaise/fatigue.  HENT: Positive for congestion and sinus pain.   Eyes: Negative.   Cardiovascular: Positive for chest pain, palpitations, orthopnea and leg swelling.  Gastrointestinal: Positive for heartburn.  Genitourinary: Negative.   Musculoskeletal: Positive for back pain and myalgias.  Skin: Negative.   Neurological: Positive for weakness.  Endo/Heme/Allergies: Negative.   Psychiatric/Behavioral: Negative.    Blood pressure 120/74, pulse 90, temperature 98.2 F (36.8 C), temperature source Oral, resp. rate 14, height _0  (1.88 m), weight (!) 303 lb 5.7 oz (137.6 kg), SpO2 100 %. Physical Exam  Nursing note and vitals reviewed. Constitutional: He appears well-developed and well-nourished.  HENT:  Head: Normocephalic and atraumatic.  Neck: Normal range of motion. Neck supple.  Trach in place  Respiratory: He has decreased breath sounds. He has rhonchi.  GI: Soft. Bowel sounds are normal.  Musculoskeletal: He exhibits edema.  Skin: Rash noted. There is erythema.  Psychiatric: He has a normal mood and affect.    Assessment/Plan: Chest pain COPD Leg edema Cellulitis Diabetes Congestive heart failure GERD Hyperlipidemia Obstructive sleep apnea Renal insufficiency Obesity . Plan  agree with admit rule out for myocardial  infarction Follow up cardiac enzymes EKGs follow-up Inhalers for COPD symptoms Continue trach care Diuretic  therapy for edema Continue wrapping on legs Do not recommend cardiac cath   Dwayne D Callwood 08/28/2017, 2:18 PM

## 2017-08-28 NOTE — Progress Notes (Signed)
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 There was a Baldi, Kano P. ([id number]) Skin/Subcutaneous Tissue Debridement ([id number]) debridement with total area of 0.2 sq cm performed by STONE [name], Libi Corso E., PA-C. with the following instrument(s): Curette to remove Non-Viable tissue/material including Eschar after achieving pain control using Other (lidocaine 4%). A time out was conducted at 15:34, prior to the start of the procedure. There was no bleeding. The procedure was tolerated well with a pain level of 0 throughout and a pain level of 0 following the procedure. Post Debridement Measurements: 1cm length x 0.7cm width x 0.1cm depth; 0.055cm^3 volume. Character of Wound/Ulcer Post Debridement is stable. Post procedure Diagnosis Wound #17: Same as Pre-Procedure Plan Wound Cleansing: Wound #14 Left,Anterior Lower Leg: Clean wound with Normal Saline. May shower with protection. Wound #15 Left,Posterior Lower Leg: Clean wound with Normal Saline. May shower with protection. Wound #16 Right,Anterior Lower Leg: Clean wound with Normal Saline. May shower with protection. Skin Barriers/Peri-Wound Care: Wound #14 Left,Anterior Lower Leg: Moisturizing lotion Wound  #15 Left,Posterior Lower Leg: Moisturizing lotion Wound #16 Right,Anterior Lower Leg: Moisturizing lotion Wound #17 Right Hand - 2nd Digit: Moisturizing lotion Primary Wound Dressing: Wound #14 Left,Anterior Lower Leg: Silvercel Non-Adherent Wound #15 Left,Posterior Lower Leg: Silvercel Non-Adherent Wound #16 Right,Anterior Lower Leg: Silvercel Non-Adherent Wound #17 Right Hand - 2nd Digit: Prisma Ag - moisten with saline Secondary Dressing: Wound #14 Left,Anterior Lower Leg: ABD pad Kerlix and Coban Wound #15 Left,Posterior Lower Leg: ABD pad Kerlix and Coban Wound #16 Right,Anterior Lower Leg: ABD pad Kerlix and Coban Wound #17 Right Hand - 2nd Digit: Other - coverlet and stretch net Dressing Change Frequency: Wound #14 Left,Anterior Lower Leg: Change dressing every week Wound #15 Left,Posterior Lower Leg: Mccain, Alfredo P. ([id number]) Change dressing every week Wound #16 Right,Anterior Lower Leg: Change dressing every week Follow-up Appointments: Wound #14 Left,Anterior Lower Leg: Return Appointment in 1 week. Wound #15 Left,Posterior Lower Leg: Return Appointment in 1 week. Wound #16 Right,Anterior Lower Leg: Return Appointment in 1 week. Edema Control: Wound #14 Left,Anterior Lower Leg: Kerlix and Coban - Bilateral Wound #15 Left,Posterior Lower Leg: Kerlix and Coban - Bilateral Wound #16 Right,Anterior Lower Leg: Kerlix and Coban - Bilateral Additional Orders / Instructions: Wound #14 Left,Anterior Lower Leg: Increase protein intake. Activity as tolerated Wound #15 Left,Posterior Lower Leg: Increase protein intake. Activity as tolerated Wound #16 Right,Anterior Lower Leg: Increase protein intake. Activity as tolerated We will continue with the Current wound care measures for the bilateral lower extremities. Subsequently I will initiate Prisma for the right second finger. Hopefully this will help in general with his healing. We will see were things stand  in one [name] time. Please see above for specific wound care orders. We will see patient for re-evaluation in 1 week(s) here in the clinic. If anything worsens or changes patient will contact our office for additional recommendations. Electronic Signature(s) Signed: 08/26/2017 7:21:18 PM By: Lenda Kelp PA-C Previous Signature: 08/25/2017 3:54:10 PM Version By: Lenda Kelp PA-C Entered By: Lenda Kelp on 08/25/2017 15:55:24 Haertel, [name] ([id number]) -------------------------------------------------------------------------------- ROS/PFSH Details Patient Name: Pinney, Avonte P. Date of Service: 08/25/2017 2:45 PM Medical Record Number:  [id number] Patient Account Number: [id number] Date of Birth/Sex: 22-Aug-1960 (57 y.o. Male) Treating RN: Renne Crigler Primary Care Provider: Hillery [name] Other Clinician: Referring Provider: Hillery [name] Treating Provider/Extender: [name], Allisen Pidgeon [name] in Treatment: 3 Information Obtained From Patient Wound History Do you currently have one or more open woundso Yes How many open wounds do you currently haveo 5 Approximately how long have you had your woundso several months How have you been treating your wound(s) until nowo doxycycline for cellulitis Has your wound(s) ever healed and then re-openedo No Have you had any lab work done in the past montho No Have you tested positive for an antibiotic resistant organism (MRSA, VRE)o Yes Have you tested positive for osteomyelitis (bone infection)o No Have you had any tests for circulation on your legso Yes Who ordered the testo cone 5 years ago Constitutional Symptoms (General Health) Complaints and Symptoms: Negative for: Fever; Chills Cardiovascular Complaints and Symptoms: Positive for: LE edema Medical History: Positive for: Arrhythmia - afib; Deep Vein Thrombosis; Hypertension; Myocardial Infarction Negative for: Angina; Congestive Heart Failure; Coronary Artery Disease; Hypotension;  Peripheral Arterial Disease; Peripheral Venous Disease; Phlebitis Eyes Medical History: Negative for: Cataracts; Glaucoma; Optic Neuritis Ear/Nose/Mouth/Throat Medical History: Negative for: Chronic sinus problems/congestion; Middle ear problems Hematologic/Lymphatic Medical History: Positive for: Lymphedema Negative for: Anemia; Hemophilia; Human Immunodeficiency Virus; Sickle Cell Disease Respiratory Complaints and Symptoms: No Complaints or Symptoms Geno, Dreydon P. ([id number]) Medical History: Positive for: Sleep Apnea - cpap and ventalator for trach Negative for: Aspiration; Asthma; Chronic Obstructive Pulmonary Disease (COPD); Pneumothorax; Tuberculosis Gastrointestinal Medical History: Negative for: Cirrhosis ; Colitis; Crohnos; Hepatitis A; Hepatitis B; Hepatitis C Endocrine Medical History: Positive for: Type II Diabetes Time with diabetes: 6 years Treated with: Insulin Blood sugar tested every day: No Blood sugar testing results: Bedtime: 132 Genitourinary Medical History: Negative for: End Stage Renal Disease Immunological Medical History: Negative for: Lupus Erythematosus; Raynaudos; Scleroderma Musculoskeletal Medical History: Negative for: Gout; Rheumatoid Arthritis; Osteoarthritis; Osteomyelitis Neurologic Medical History: Positive for: Neuropathy Negative for: Quadriplegia; Paraplegia; Seizure Disorder Oncologic Medical History: Negative for: Received Chemotherapy; Received Radiation Psychiatric Complaints and Symptoms: No Complaints or Symptoms Medical History: Negative for: Anorexia/bulimia; Confinement Anxiety Immunizations Pneumococcal Vaccine: Received Pneumococcal Vaccination: Yes Implantable Devices Haley, Artavius P. ([id number]) Family and Social History Cancer: Yes - Siblings; Diabetes: No; Heart Disease: Yes - Siblings,Father,Mother,Paternal Grandparents,Maternal Grandparents; Hereditary Spherocytosis: No; Hypertension: Yes -  Child,Siblings,Father,Paternal Grandparents,Maternal Grandparents; Kidney Disease: No; Lung Disease: No; Seizures: No; Stroke: No; Thyroid Problems: No; Tuberculosis: Yes - Maternal Grandparents; Never smoker; Marital Status - Divorced; Alcohol Use: Never; Drug Use: No History; Caffeine Use: Daily - coffee; Financial Concerns: No; Food, Clothing or Shelter Needs: No; Support System Lacking: No; Transportation Concerns: No; Advanced Directives: No; Patient does not want information on Advanced Directives; Living Will: No Physician Affirmation I have reviewed and agree with the above information. Electronic Signature(s) Signed: 08/25/2017 3:54:10 PM By: Lenda Kelp PA-C Signed: 08/27/2017 4:15:18 PM By: Renne Crigler Entered By: Lenda Kelp on 08/25/2017 15:41:33 Cooner, [name] ([id number]) -------------------------------------------------------------------------------- SuperBill Details Patient Name: Threat, Aadin P. Date of Service: 08/25/2017 Medical Record Number: [id number] Patient Account Number: [id number] Date of Birth/Sex: 05-Apr-1961 (57 y.o. Male) Treating RN: Renne Crigler Primary Care Provider: Hillery [name] Other Clinician: Referring Provider: Hillery [name] Treating Provider/Extender: [name], Ron Junco [name] in Treatment: 3 Diagnosis Coding ICD-10 Codes Code Description I89.0 Lymphedema, not elsewhere classified E11.622 Type 2 diabetes mellitus with other skin ulcer L97.821 Non-pressure chronic ulcer of other part of left lower  leg limited to breakdown of skin S61.200A Unspecified open wound of right index finger without damage to nail, initial encounter L97.811 Non-pressure chronic ulcer of other part of right lower leg limited to breakdown of skin 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 I48.2 Chronic atrial fibrillation Facility Procedures CPT4  Code: 16109604 Description: 11042 - DEB SUBQ TISSUE 20 SQ CM/< ICD-10 Diagnosis Description L98.492 Non-pressure chronic ulcer of skin of other sites with fat l Modifier: ayer exposed Quantity: 1 Physician Procedures CPT4 Code Description: 5409811 91478 - WC PHYS LEVEL 3 - EST PT ICD-10 Diagnosis Description I89.0 Lymphedema, not elsewhere classified E11.622 Type 2 diabetes mellitus with other skin ulcer L97.821 Non-pressure chronic ulcer of other part of left lower  leg limi S61.200A Unspecified open wound of right index finger without damage to Modifier: 25 ted to breakdown nail, initial en Quantity: 1 of skin counter CPT4 Code Description: [id number] 11042 - WC PHYS SUBQ TISS 20 SQ CM ICD-10 Diagnosis Description L98.492 Non-pressure chronic ulcer of skin of other sites with fat laye Modifier: r exposed Quantity: 1 Electronic Signature(s) Signed: 08/25/2017 3:54:10 PM By: Lenda Kelp PA-C Entered By: Lenda Kelp on 08/25/2017 15:46:14

## 2017-08-28 NOTE — Progress Notes (Signed)
Nutrition Brief Note  Patient identified to be seen for new ventilator  Wt Readings from Last 15 Encounters:  08/27/17 (!) 303 lb 5.7 oz (137.6 kg)  07/28/17 (!) 309 lb (140.2 kg)  07/18/17 (!) 309 lb 4.9 oz (140.3 kg)  04/09/17 (!) 301 lb 1.6 oz (136.6 kg)  11/02/16 (!) 323 lb 6.6 oz (146.7 kg)  05/16/14 (!) 360 lb 9.6 oz (41.58 kg)   57 year old male with PMHx of DM type 2, diabetic peripheral neuropathy, HLD, HTN, GERD, pancreatitis, EtOH abuse, renal insufficiency, CHF, hx of MI, chronic tracheostomy who was admitted with chronic hyponatremia secondary to diuretic use and EtOH abuse.   Met with patient at bedside. He was sitting in chair and on trach collar. He reports he always uses the ventilator at night. He is able to do all of his trach care by himself. He reports that he eats well and has a good appetite. He reports he does not use a PMV to eat. He denies any weight loss. Reports UBW is 300-305 lbs. Patient is upset his sodium is being restricted but understands he needs to be on a heart healthy diet. No other nutrition questions or concerns at this time.  No subcutaneous fat or muscle wasting found on Nutrition-Focused Physical Exam.  Patient does not meet criteria for malnutrition.  Body mass index is 38.95 kg/m. Patient meets criteria for Obesity Class II based on current BMI.   Current diet order is Heart Healthy/Carbohydrate Modified, patient is consuming approximately 100% of meals at this time. Labs and medications reviewed.   No nutrition interventions warranted at this time. If nutrition issues arise, please consult RD.   Willey Blade, MS, [place], LDN Office: [phone] Pager: [phone] After Hours/Weekend Pager: [phone]

## 2017-08-29 LAB — BASIC METABOLIC PANEL
Anion gap: 8 (ref 5–15)
BUN: 11 mg/dL (ref 6–20)
CHLORIDE: 94 mmol/L — AB (ref 101–111)
CO2: 26 mmol/L (ref 22–32)
CREATININE: 0.61 mg/dL (ref 0.61–1.24)
Calcium: 9.7 mg/dL (ref 8.9–10.3)
GFR calc non Af Amer: >60 mL/min (ref >60)
GLUCOSE: 116 mg/dL — AB (ref 65–99)
Potassium: 4.4 mmol/L (ref 3.5–5.1)
Sodium: 128 mmol/L — ABNORMAL LOW (ref 135–145)

## 2017-08-29 LAB — CBC WITH DIFFERENTIAL/PLATELET
Basophils Absolute: 0 10*3/uL (ref 0–0.1)
Basophils Relative: 1 %
EOS ABS: 0.1 10*3/uL (ref 0–0.7)
Eosinophils Relative: 2 %
HEMATOCRIT: 38.5 % — AB (ref 40.0–52.0)
Hemoglobin: 12.9 g/dL — ABNORMAL LOW (ref 13.0–18.0)
LYMPHS ABS: 1.2 10*3/uL (ref 1.0–3.6)
Lymphocytes Relative: 19 %
MCH: 30.8 pg (ref 26.0–34.0)
MCHC: 33.6 g/dL (ref 32.0–36.0)
MCV: 91.9 fL (ref 80.0–100.0)
MONOS PCT: 8 %
Monocytes Absolute: 0.5 10*3/uL (ref 0.2–1.0)
NEUTROS PCT: 70 %
Neutro Abs: 4.4 10*3/uL (ref 1.4–6.5)
Platelets: 218 10*3/uL (ref 150–440)
RBC: 4.19 MIL/uL — ABNORMAL LOW (ref 4.40–5.90)
RDW: 13.2 % (ref 11.5–14.5)
WBC: 6.3 10*3/uL (ref 3.8–10.6)

## 2017-08-29 LAB — GLUCOSE, CAPILLARY
GLUCOSE-CAPILLARY: 160 mg/dL — AB (ref 65–99)
GLUCOSE-CAPILLARY: 118 mg/dL — AB (ref 65–99)
Glucose-Capillary: 90 mg/dL (ref 65–99)
Glucose-Capillary: 114 mg/dL — ABNORMAL HIGH (ref 65–99)

## 2017-08-29 LAB — APTT: aPTT: 29 seconds (ref 24–36)

## 2017-08-29 MED ORDER — HEPARIN BOLUS VIA INFUSION
5000.0000 [IU] | Freq: Once | INTRAVENOUS | Status: AC
  Administered 2017-08-29: 5000 [IU] via INTRAVENOUS
  Filled 2017-08-29: qty 5000

## 2017-08-29 MED ORDER — HEPARIN (PORCINE) IN NACL 100-0.45 UNIT/ML-% IJ SOLN
1750.0000 [IU]/h | INTRAMUSCULAR | Status: DC
  Administered 2017-08-29: 1750 [IU]/h via INTRAVENOUS
  Filled 2017-08-29: qty 250

## 2017-08-29 MED ORDER — ENSURE ENLIVE PO LIQD
237.0000 mL | Freq: Two times a day (BID) | ORAL | Status: DC
  Administered 2017-08-29 – 2017-08-30 (×3): 237 mL via ORAL

## 2017-08-29 NOTE — Progress Notes (Signed)
[facility] Medicine Progess Note    SYNOPSIS   57 yo male with chronic tracheostomy admitted withacute on chronic hyponatremia secondary to diuretic use and ETOH abuse, angina, and acuteon chronicrespiratory failure requiringtransfer to thestepdown unit for chronic nocturnal ventilation via tracheostomy  ASSESSMENT/PLAN   Chronic Tracheostomy. Patient on nocturnal mechanical ventilation. Thick central secretions however chest x-ray does not reveal clear pneumonia. Patient had recent bronchoscopyby and was noted. History to have a polyp. [facility] This was canceled secondary to hyponatremia. Will need to be rescheduled  Acute on chronic hyponatremia secondary to diuretic use and ETOH abuse  Chronic Atrial Fibrillation. On systemic and coagulation. Being followed by cardiology   Angina.   Hx: OSA, CHF, Diabetes Mellitus, HTN, Obesity, Hyperlipidemia   Since patient has required multiple hospitalization admissions will continue to hospitalize until sodium is within normal range. We will also check with cardiology to see if they have plans for cardiac catheterization and timing for systemic anticoagulation stopping.   VENTILATOR SETTINGS: Vent Mode: PSV FiO2 (%):  [30 %] 30 % PEEP:  [5 cmH20] 5 cmH20 Pressure Support:  [10 cmH20] 10 cmH20  INTAKE / OUTPUT:  Intake/Output Summary (Last 24 hours) at 08/29/2017 1140 Last data filed at 08/29/2017 1113 Gross per 24 hour  Intake 1611.67 ml  Output 2550 ml  Net -938.33 ml     Name: [name] MRN: [id number] DOB: December 24, 1960    ADMISSION DATE:  08/27/2017  SUBJECTIVE:   Patient states he still has congestion and shortness of breath. Last sodium level was 128. Initially on admission it was 117. Appreciate nephrology's input, felt to be multifactorial to include diuretic therapy, no fluid restriction along with your ingestion. Patient has had multiple discharges and readmissions over the  last month  VITAL SIGNS: Temp:  [98.2 F (36.8 C)-98.4 F (36.9 C)] 98.2 F (36.8 C) (03/08 0900) Pulse Rate:  [60-119] 106 (03/08 1100) Resp:  [9-39] 31 (03/08 1100) BP: (102-154)/(60-99) 135/89 (03/08 0902) SpO2:  [91 %-100 %] 92 % (03/08 1100) FiO2 (%):  [30 %] 30 % (03/08 0100)  PHYSICAL EXAMINATION: Physical Examination:   VS: BP 135/89   Pulse (!) 106   Temp 98.2 F (36.8 C) (Oral)   Resp (!) 31   Ht 6\' 2"  (1.88 m)   Wt (!) 137.6 kg (303 lb 5.7 oz)   SpO2 92%   BMI 38.95 kg/m   General Appearance: No distress  Neuro:without focal findings, mental status normal. HEENT: PERRLA, EOM intact.tracheostomy in place Pulmonary: coarse central secretions and bronchial breath sounds   Cardiovascular tachycardia appreciated, irregular rhythm with atrial fibrillation Abdomen: Benign, Soft, non-tender. Renal:  No costovertebral tenderness  Skin:   warm, no rashes, no ecchymosis  Extremities: normal, no cyanosis, clubbing.    LABORATORY PANEL:   CBC Recent Labs  Lab 08/29/17 0851  WBC 6.3  HGB 12.9*  HCT 38.5*  PLT 218    Chemistries  Recent Labs  Lab 08/29/17 0851  NA 128*  K 4.4  CL 94*  CO2 26  GLUCOSE 116*  BUN 11  CREATININE 0.61  CALCIUM 9.7    Recent Labs  Lab 08/27/17 2133 08/28/17 0751 08/28/17 1139 08/28/17 1652 08/28/17 2116 08/29/17 0749  GLUCAP 104* 114* 151* 137* 108* 90   No results for input(s): PHART, PCO2ART, PO2ART in the last 168 hours. No results for input(s): AST, ALT, ALKPHOS, BILITOT, ALBUMIN in the last 168 hours.  Cardiac Enzymes Recent Labs  Lab 08/28/17 0421  TROPONINI <0.03    RADIOLOGY:  Dg Chest 2 View  Result Date: 08/27/2017 CLINICAL DATA:  Worsening chest pain over the past 2 hours. EXAM: CHEST - 2 VIEW COMPARISON:  07/22/2017 FINDINGS: Stable cardiomegaly and mediastinal contours. Tracheostomy tube tip terminates over the superior mediastinum, centered over the upper trachea. No pulmonary consolidation,  effusion or pneumothorax. The visualized skeletal structures are unremarkable. IMPRESSION: No active cardiopulmonary disease.  Stable cardiomegaly. Electronically Signed   By: Tollie Eth M.D.   On: 08/27/2017 16:59    Tora Kindred, DO  08/29/2017

## 2017-08-29 NOTE — Progress Notes (Addendum)
ANTICOAGULATION CONSULT NOTE - Follow Up Consult  Pharmacy Consult for heparin dosing  Indication: atrial fibrillation  Allergies  Allergen Reactions  . Cephalexin     Other reaction(s): Unknown  . Hctz  [Hydrochlorothiazide]     Other reaction(s): Other (See Comments) hyponatremia  . Hydralazine   . Penicillins Hives    Has patient had a PCN reaction causing immediate rash, facial/tongue/throat swelling, SOB or lightheadedness with hypotension: Yes Has patient had a PCN reaction causing severe rash involving mucus membranes or skin necrosis: Yes Has patient had a PCN reaction that required hospitalization: Unknown Has patient had a PCN reaction occurring within the last 10 years: No If all of the above answers are "NO", then may proceed with Cephalosporin use.   Marland Kitchen. Reserpine   . Zaroxolyn [Metolazone]     Other reaction(s): Unknown    Patient Measurements: Height: 6\' 2"  (188 cm) Weight: (!) 303 lb 5.7 oz (137.6 kg) IBW/kg (Calculated) : 82.2 Heparin Dosing Weight: 113kg   Vital Signs: Temp: 98.2 F (36.8 C) (03/08 0900) Temp Source: Oral (03/08 0900) BP: 135/89 (03/08 0902) Pulse Rate: 106 (03/08 1100)  Labs: Recent Labs    08/27/17 1636 08/27/17 1918 08/27/17 2306 08/28/17 0421 08/29/17 0851  HGB 13.4  --   --  12.8* 12.9*  HCT 39.1*  --   --  37.2* 38.5*  PLT 237  --   --  219 218  CREATININE 0.60*  --   --  0.62 0.61  TROPONINI <0.03 <0.03 <0.03 <0.03  --     Estimated Creatinine Clearance: 154.1 mL/min (by C-G formula based on SCr of 0.61 mg/dL).   Medications:  Medications Prior to Admission  Medication Sig Dispense Refill Last Dose  . aspirin EC 81 MG tablet Take 162 mg by mouth daily as needed.   08/27/2017 at 0800  . atorvastatin (LIPITOR) 40 MG tablet Take 1 tablet (40 mg total) by mouth daily at 6 PM. 30 tablet 1 08/26/2017 at 1800  . busPIRone (BUSPAR) 10 MG tablet Take 10 mg by mouth 2 (two) times daily as needed.    08/27/2017 at 0800  . enalapril  (VASOTEC) 10 MG tablet Take 10 mg by mouth 2 (two) times daily.   08/27/2017 at 0800  . furosemide (LASIX) 80 MG tablet Take 1-2 tablets (80-160 mg total) by mouth daily.   08/26/2017 at 0800  . gabapentin (NEURONTIN) 800 MG tablet Take 800 mg by mouth 2 (two) times daily. 1 tablet twice daily   08/27/2017 at 0800  . insulin detemir (LEVEMIR) 100 unit/ml SOLN Inject 30-35 Units into the skin 2 (two) times daily.    08/26/2017 at 2000  . insulin lispro (HUMALOG) 100 UNIT/ML injection Inject 5 Units into the skin daily as needed for high blood sugar.    prn at prn  . lamoTRIgine (LAMICTAL) 150 MG tablet Take 150 mg by mouth 2 (two) times daily.    08/27/2017 at 0800  . metFORMIN (GLUCOPHAGE-XR) 500 MG 24 hr tablet Take 1,000 mg by mouth 2 (two) times daily.    08/27/2017 at 0800  . metoprolol (LOPRESSOR) 100 MG tablet Take 100 mg by mouth 2 (two) times daily. 100 mg twice daily   08/27/2017 at 0800  . omeprazole (PRILOSEC) 20 MG capsule Take 20 mg by mouth daily. Once daily   08/27/2017 at 0800  . oxyCODONE-acetaminophen (PERCOCET) 10-325 MG tablet Take 1 tablet by mouth 4 (four) times daily as needed for pain.    prn  at prn  . rivaroxaban (XARELTO) 20 MG TABS tablet Take 20 mg by mouth daily.   08/27/2017 at 0800  . carbamazepine (TEGRETOL) 200 MG tablet Take 2 tablets (400 mg total) by mouth daily at 10 pm. (Patient not taking: Reported on 07/17/2017) 30 tablet 0 Not Taking at Unknown time  . collagenase (SANTYL) ointment Apply topically daily. 15 g 0 07/27/2017 at Unknown time    Assessment: Pharmacy consulted for heparin drip management for 57 yo male on rivaroxaban 20mg  daily as an outpatient. Patient is to have cardiac cath on Monday and is being transitioned to heparin drip prior to procedure. Patient's last dose of rivaroxaban was with dinner on 3/7.   Goal of Therapy:  Heparin level 0.3-0.7 units/ml aPTT 66-102 seconds Monitor platelets by anticoagulation protocol: Yes   Plan:  Will initiate patient on  heparin bolus 5000 units followed by heparin drip at 1450 units/hr. Will obtain initiate aPTT level at 0200 on 3/9. Will monitor with aPTTs until anti-Xa and aPTTs correlate. Will obtain anti-Xa levels daily until correlation occurs.   Pharmacy will continue to monitor and adjust per consult.   Simpson,Michael L 08/29/2017,4:35 PM

## 2017-08-29 NOTE — Progress Notes (Signed)
[facility] [place], [place] 08/29/17  Subjective:   Na 121 (3/7) Hgb 12.8 UOP 2960 cc No new labs this morning Patient is sitting up in the chair eating  He ate 100% of his breakfast Continues to have some lower extremity edema   Objective:  Vital signs in last 24 hours:  Temp:  [98.2 F (36.8 C)-98.4 F (36.9 C)] 98.4 F (36.9 C) (03/08 0000) Pulse Rate:  [60-119] 79 (03/08 0600) Resp:  [9-36] 22 (03/08 0600) BP: (102-154)/(60-99) 102/65 (03/08 0600) SpO2:  [91 %-100 %] 93 % (03/08 0750) FiO2 (%):  [30 %] 30 % (03/08 0100)  Weight change:  Filed Weights   08/27/17 1632 08/27/17 2015  Weight: (!) 138.3 kg (305 lb) (!) 137.6 kg (303 lb 5.7 oz)    Intake/Output:    Intake/Output Summary (Last 24 hours) at 08/29/2017 [id number] Last data filed at 08/29/2017 0200 Gross per 24 hour  Intake 1371.67 ml  Output 2960 ml  Net -1588.33 ml     Physical Exam: General:  Sitting up in the chair, no acute distress  HEENT  anicteric, moist oral mucous membranes  Neck  tracheostomy in place  Pulm/lungs  normal breathing effort, clear to auscultation  CVS/Heart  irregular, atrial fibrillation  Abdomen:   Soft, obese  Extremities:  2+ edema bilaterally, legs wrapped  Neurologic:  Alert, oriented  Skin:  No acute rashes          Basic Metabolic Panel:  Recent Labs  Lab 08/27/17 1636 08/28/17 0421  NA 117* 121*  K 4.9 5.0  CL 83* 88*  CO2 22 26  GLUCOSE 119* 118*  BUN 12 12  CREATININE 0.60* 0.62  CALCIUM 8.9 8.8*     CBC: Recent Labs  Lab 08/27/17 1636 08/28/17 0421  WBC 10.3 7.3  HGB 13.4 12.8*  HCT 39.1* 37.2*  MCV 90.7 90.3  PLT 237 219     No results found for: HEPBSAG, HEPBSAB, HEPBIGM    Microbiology:  Recent Results (from the past 240 hour(s))  MRSA PCR Screening     Status: None   Collection Time: 08/27/17  8:17 PM  Result Value Ref Range Status   MRSA by PCR NEGATIVE NEGATIVE Final    Comment:        The GeneXpert MRSA  Assay (FDA approved for NASAL specimens only), is one component of a comprehensive MRSA colonization surveillance program. It is not intended to diagnose MRSA infection nor to guide or monitor treatment for MRSA infections. Performed at [facility], [address] [place], [place] [id number]     Coagulation Studies: No results for input(s): LABPROT, INR in the last 72 hours.  Urinalysis: No results for input(s): COLORURINE, LABSPEC, PHURINE, GLUCOSEU, HGBUR, BILIRUBINUR, KETONESUR, PROTEINUR, UROBILINOGEN, NITRITE, LEUKOCYTESUR in the last 72 hours.  Invalid input(s): APPERANCEUR    Imaging: Dg Chest 2 View  Result Date: 08/27/2017 CLINICAL DATA:  Worsening chest pain over the past 2 hours. EXAM: CHEST - 2 VIEW COMPARISON:  07/22/2017 FINDINGS: Stable cardiomegaly and mediastinal contours. Tracheostomy tube tip terminates over the superior mediastinum, centered over the upper trachea. No pulmonary consolidation, effusion or pneumothorax. The visualized skeletal structures are unremarkable. IMPRESSION: No active cardiopulmonary disease.  Stable cardiomegaly. Electronically Signed   By: Tollie Ethavid  Kwon M.D.   On: 08/27/2017 16:59     Medications:   . sodium chloride 50 mL/hr at 08/29/17 0200   . atorvastatin  40 mg Oral q1800  . enalapril  10 mg  Oral BID  . folic acid  1 mg Oral Daily  . gabapentin  800 mg Oral BID  . insulin aspart  0-20 Units Subcutaneous TID WC  . insulin aspart  0-5 Units Subcutaneous QHS  . insulin detemir  30 Units Subcutaneous BID  . lamoTRIgine  150 mg Oral BID  . metFORMIN  1,000 mg Oral BID WC  . metoprolol tartrate  100 mg Oral BID  . multivitamin with minerals  1 tablet Oral Daily  . pantoprazole  40 mg Oral Daily  . rivaroxaban  20 mg Oral Q supper  . senna-docusate  2 tablet Oral BID  . thiamine  100 mg Oral Daily   acetaminophen **OR** acetaminophen, busPIRone, LORazepam, morphine injection, ondansetron **OR** ondansetron (ZOFRAN)  IV, oxyCODONE-acetaminophen **AND** oxyCODONE, polyethylene glycol  Assessment/ Plan:  57 y.o. male with a PMHx of congestive heart failure with normal ejection fraction, paroxysmal atrial fibrillation, chronic respiratory failure status post tracheostomy placement with nocturnal ventilation, history of pulmonary embolism, obstructive sleep apnea, diabetes mellitus type 2, peripheral neuropathy, elevated liver enzymes, GERD, hypertension, history of pancreatitis who was admitted to [facility] on 08/27/2017 for evaluation of chest pain and shortness of breath.   1.  Hyponatremia. 2.  Chronic respiratory failure with tracheostomy and nocturnal ventilation. 3.  Lower extremity edema. 4.  ETOH abuse  Workup so far includes normal TSH, low normal plasma cortisol, low serum osmolality of 259 Urine osmolality 151, urine sodium less than 10 2D echo from May 2018 shows LVEF 55-60%  Hyponatremia is multifactorial but likely related to use of large dose of Lasix as outpatient this time.  Sodium level has improved with IV saline administration.  Recommended to patient to decrease his beer consumption.  He normally drinks 3-4 beers daily Also need to follow reasonable fluid restriction.  Follows thirst If Lasix is required for LE edema,  would use low-dose of 20 mg twice daily and follow low-salt diet at home.    LOS: 2 [name] Thedore Mins 3/8/20198:32 AM  [facility] [place], [place] [phone]  Note: This note was prepared with Dragon dictation. Any transcription errors are unintentional

## 2017-08-29 NOTE — Progress Notes (Signed)
FABION, GATSON ([id number]) Visit Report for 08/25/2017 Arrival Information Details Patient Name: [name], [name]. Date of Service: 08/25/2017 2:45 PM Medical Record Number: [id number] Patient Account Number: [id number] Date of Birth/Sex: August 18, 1960 (57 y.o. Male) Treating RN: [name] Primary Care [name]: [name] Other Clinician: Referring [name]: [name] Treating [name]/Extender: [name], [name] in Treatment: 3 Visit Information History Since Last Visit All ordered tests and consults were completed: No Patient Arrived: Ambulatory Added or deleted any medications: No Arrival Time: 15:00 Any new allergies or adverse reactions: No Accompanied By: self Had a fall or experienced change in No Transfer Assistance: None activities of daily living that may affect Patient Identification Verified: Yes risk of falls: Secondary Verification Process Yes Signs or symptoms of abuse/neglect since last visito No Completed: Hospitalized since last visit: No Patient Requires Transmission-Based No Has Dressing in Place as Prescribed: Yes Precautions: Pain Present Now: No Patient Has Alerts: Yes Patient Alerts: noncompressible bilateral Electronic Signature(s) Signed: 08/27/2017 4:15:20 PM By: [name] Entered By: [name] on 08/25/2017 15:01:41 [name], [name] ([id number]) -------------------------------------------------------------------------------- Encounter Discharge Information Details Patient Name: [name], [name] Date of Service: 08/25/2017 2:45 PM Medical Record Number: [id number] Patient Account Number: [id number] Date of Birth/Sex: [birth year] (57 y.o. Male) Treating RN: [name] Primary Care Ishaaq Penna: [name] Other Clinician: Referring Muntaha Vermette: [name] Treating Harmoni Lucus/Extender: [name] in Treatment: 3 Encounter Discharge Information Items Schedule Follow-up Appointment: No Medication Reconciliation completed  and No provided to Patient/Care Arcadia Gorgas: Provided on Clinical Summary of Care: 08/25/2017 Form Type Recipient Paper Patient [name] Electronic Signature(s) Signed: 08/28/2017 11:42:25 AM By: [name] Entered By: [name] on 08/25/2017 15:49:46 [name], [name] ([id number]) -------------------------------------------------------------------------------- Lower Extremity Assessment Details Patient Name: [name], [name] Date of Service: 08/25/2017 2:45 PM Medical Record Number: [id number] Patient Account Number: [id number] Date of Birth/Sex: [birth year] (57 y.o. Male) Treating RN: [name], [name] Primary Care Rayelynn Loyal: [name] Other Clinician: Referring Reniya Mcclees: [name] Treating Donavon Kimrey/Extender: [name], [name] in Treatment: 3 Edema Assessment Assessed: [Left: No] [Right: No] [Left: Edema] [Right: :] Calf Left: Right: Point of Measurement: 38 cm From Medial Instep 46 cm 49.5 cm Ankle Left: Right: Point of Measurement: 14 cm From Medial Instep 32.2 cm 28.2 cm Vascular Assessment Pulses: Dorsalis Pedis Palpable: [Left:Yes] [Right:Yes] Posterior Tibial Extremity colors, hair growth, and conditions: Extremity Color: [Left:Hyperpigmented] [Right:Hyperpigmented] Temperature of Extremity: [Left:Warm] [Right:Warm] Capillary Refill: [Left:< 3 seconds] [Right:< 3 seconds] Toe Nail Assessment Left: Right: Thick: Yes Yes Discolored: Yes Yes Deformed: Yes Yes Improper Length and Hygiene: Yes Yes Electronic Signature(s) Signed: 08/27/2017 4:15:20 PM By: [name] Entered By: [name] on 08/25/2017 15:20:59 Lemberger, [name] P. ([id number]) -------------------------------------------------------------------------------- Multi Wound Chart Details Patient Name: [name], [name] Date of Service: 08/25/2017 2:45 PM Medical Record Number: [id number] Patient Account Number: [id number] Date of Birth/Sex: 07-Nov-1960 (57 y.o. Male) Treating RN: [name] Primary Care  Tayshun Gappa: Hillery [name] Other Clinician: Referring Cayenne Breault: [name] Treating Kathia Covington/Extender: [name], [name] in Treatment: 3 Vital Signs Height(in): 74 Pulse(bpm): 97 Weight(lbs): 310 Blood Pressure(mmHg): 104/61 Body Mass Index(BMI): 40 Temperature(F): 98.4 Respiratory Rate 20 (breaths/min): Photos: [14:No Photos] [15:No Photos] [16:No Photos] Wound Location: [14:Left Lower Leg - Anterior] [15:Left Lower Leg - Posterior] [16:Right Lower Leg - Anterior] Wounding Event: [14:Gradually Appeared] [15:Gradually Appeared] [16:Gradually Appeared] Primary Etiology: [14:Venous Leg Ulcer] [15:Venous Leg Ulcer] [16:Venous Leg Ulcer] Comorbid History: [14:Lymphedema, Sleep Apnea, Arrhythmia, Deep Vein Thrombosis, Hypertension, Myocardial Infarction, Type II Diabetes, Neuropathy] [15:Lymphedema, Sleep Apnea, Arrhythmia,  Deep Vein Thrombosis, Hypertension, Myocardial Infarction, Type  II Diabetes, Neuropathy] [16:Lymphedema, Sleep Apnea, Arrhythmia, Deep Vein Thrombosis, Hypertension, Myocardial Infarction, Type II Diabetes, Neuropathy] Date Acquired: [14:08/15/2017] [15:08/15/2017] [16:08/15/2017] [name] of Treatment: [14:1] [15:1] [16:1] Wound Status: [14:Open] [15:Open] [16:Open] Measurements L x W x D [14:1.8x1.5x0.1] [15:1x1x0.1] [16:3.5x3x0.1] (cm) Area (cm) : [14:2.121] [15:0.785] [16:8.247] Volume (cm) : [14:0.212] [15:0.079] [16:0.825] % Reduction in Area: [14:51.00%] [15:30.10%] [16:-40.00%] % Reduction in Volume: [14:51.00%] [15:29.50%] [16:-40.10%] Classification: [14:Partial Thickness] [15:Partial Thickness] [16:Partial Thickness] Exudate Amount: [14:Large] [15:Medium] [16:Large] Exudate Type: [14:Serous] [15:Serous] [16:Serous] Exudate Color: [14:amber] [15:amber] [16:amber] Wound Margin: [14:Flat and Intact] [15:Flat and Intact] [16:Flat and Intact] Granulation Amount: [14:None Present (0%)] [15:Small (1-33%)] [16:Medium (34-66%)] Granulation Quality: [14:N/A]  [15:Red] [16:Red] Necrotic Amount: [14:Large (67-100%)] [15:Large (67-100%)] [16:Medium (34-66%)] Necrotic Tissue: [14:Eschar, Adherent Slough] [15:Eschar, Adherent Slough] [16:Eschar] Exposed Structures: [14:Fascia: No Fat Layer (Subcutaneous Tissue) Exposed: No Tendon: No Muscle: No Joint: No Bone: No] [15:Fascia: No Fat Layer (Subcutaneous Tissue) Exposed: No Tendon: No Muscle: No Joint: No Bone: No] [16:Fascia: No Fat Layer (Subcutaneous Tissue)  Exposed: No Tendon: No Muscle: No Joint: No Bone: No] Epithelialization: [14:Medium (34-66%)] [15:Medium (34-66%)] [16:None] Periwound Skin Texture: [14:Excoriation: No Induration: No] [15:Excoriation: No Induration: No] [16:Excoriation: No Induration: No] Callus: No Callus: No Callus: No Crepitus: No Crepitus: No Crepitus: No Rash: No Rash: No Rash: No Scarring: No Scarring: No Scarring: No Periwound Skin Moisture: Maceration: No Maceration: No Maceration: No Dry/Scaly: No Dry/Scaly: No Dry/Scaly: No Periwound Skin Color: Hemosiderin Staining: Yes Hemosiderin Staining: Yes Hemosiderin Staining: Yes Atrophie Blanche: No Atrophie Blanche: No Atrophie Blanche: No Cyanosis: No Cyanosis: No Cyanosis: No Ecchymosis: No Ecchymosis: No Ecchymosis: No Erythema: No Erythema: No Erythema: No Mottled: No Mottled: No Mottled: No Pallor: No Pallor: No Pallor: No Rubor: No Rubor: No Rubor: No Temperature: No Abnormality No Abnormality No Abnormality Tenderness on Palpation: No No No Wound Preparation: Ulcer Cleansing: Ulcer Cleansing: Ulcer Cleansing: Rinsed/Irrigated with Saline Rinsed/Irrigated with Saline Rinsed/Irrigated with Saline Topical Anesthetic Applied: Topical Anesthetic Applied: Topical Anesthetic Applied: Other: lidocaine 4% Other: lidocaine 4% Other: lidocaine 4% Wound Number: 17 N/A N/A Photos: No Photos N/A N/A Wound Location: Right Hand - 2nd Digit N/A N/A Wounding Event: Blister N/A N/A Primary  Etiology: Skin Tear N/A N/A Comorbid History: Lymphedema, Sleep Apnea, N/A N/A Arrhythmia, Deep Vein Thrombosis, Hypertension, Myocardial Infarction, Type II Diabetes, Neuropathy Date Acquired: 08/21/2017 N/A N/A [name] of Treatment: 0 N/A N/A Wound Status: Open N/A N/A Measurements L x W x D 1x0.7x0.1 N/A N/A (cm) Area (cm) : 0.55 N/A N/A Volume (cm) : 0.055 N/A N/A % Reduction in Area: N/A N/A N/A % Reduction in Volume: N/A N/A N/A Classification: Full Thickness Without N/A N/A Exposed Support Structures Exudate Amount: Large N/A N/A Exudate Type: Serosanguineous N/A N/A Exudate Color: red, brown N/A N/A Wound Margin: Flat and Intact N/A N/A Granulation Amount: Large (67-100%) N/A N/A Granulation Quality: Red N/A N/A Necrotic Amount: None Present (0%) N/A N/A Necrotic Tissue: N/A N/A N/A Exposed Structures: Fascia: No N/A N/A Fat Layer (Subcutaneous Tissue) Exposed: No Tendon: No Muscle: No Joint: No Bone: No Gambone, [name] ([id number]) Epithelialization: None N/A N/A Periwound Skin Texture: No Abnormalities Noted N/A N/A Periwound Skin Moisture: Maceration: Yes N/A N/A Periwound Skin Color: No Abnormalities Noted N/A N/A Temperature: No Abnormality N/A N/A Tenderness on Palpation: No N/A N/A Wound Preparation: Ulcer Cleansing: N/A N/A Rinsed/Irrigated with Saline Topical Anesthetic Applied: Other: lidocaine 4% Treatment Notes Electronic Signature(s) Signed: 08/27/2017 4:15:18 PM By:  Flinchum, Cheryl Entered By: [name] on 08/25/2017 15:27:30 Teel, [name] ([id number]) -------------------------------------------------------------------------------- Multi-Disciplinary Care Plan Details Patient Name: Mende, MARON STANZIONE. Date of Service: 08/25/2017 2:45 PM Medical Record Number: [id number] Patient Account Number: [id number] Date of Birth/Sex: Nov 11, 1960 (57 y.o. Male) Treating RN: [name] Primary Care Ashiya Kinkead: [name] Other Clinician: Referring  Christino Mcglinchey: [name] Treating Dinh Ayotte/Extender: [name], [name] in Treatment: 3 Active Inactive Electronic Signature(s) Signed: 08/27/2017 4:15:18 PM By: [name] Entered By: [name] on 08/25/2017 15:27:20 [name], [name] ([id number]) -------------------------------------------------------------------------------- Pain Assessment Details Patient Name: [name], [name] Date of Service: 08/25/2017 2:45 PM Medical Record Number: [id number] Patient Account Number: [id number] Date of Birth/Sex: 1960-12-25 (57 y.o. Male) Treating RN: [name] Primary Care Olena Willy: [name] Other Clinician: Referring Nahsir Venezia: [name] Treating Sharone Almond/Extender: [name], [name] in Treatment: 3 Active Problems Location of Pain Severity and Description of Pain Patient Has Paino No Site Locations Pain Management and Medication Current Pain Management: Electronic Signature(s) Signed: 08/27/2017 4:15:20 PM By: [name] Entered By: [name] on 08/25/2017 15:01:53 Samarin, Trennon P. ([id number]) -------------------------------------------------------------------------------- Wound Assessment Details Patient Name: [name], [name] Date of Service: 08/25/2017 2:45 PM Medical Record Number: [id number] Patient Account Number: [id number] Date of Birth/Sex: May 07, 1961 (57 y.o. Male) Treating RN: [name] Primary Care Analina Filla: [name] Other Clinician: Referring Hades Mathew: [name] Treating Marien Manship/Extender: [name], [name] in Treatment: 3 Wound Status Wound Number: 14 Primary Venous Leg Ulcer Etiology: Wound Location: Left Lower Leg - Anterior Wound Open Wounding Event: Gradually Appeared Status: Date Acquired: 08/15/2017 Comorbid Lymphedema, Sleep Apnea, Arrhythmia, Deep [name] Of Treatment: 1 History: Vein Thrombosis, Hypertension, Myocardial Clustered Wound: No Infarction, Type II Diabetes, Neuropathy Photos Photo Uploaded By:  [name] on 08/26/2017 08:12:26 Wound Measurements Length: (cm) 1.8 Width: (cm) 1.5 Depth: (cm) 0.1 Area: (cm) 2.121 Volume: (cm) 0.212 % Reduction in Area: 51% % Reduction in Volume: 51% Epithelialization: Medium (34-66%) Tunneling: No Undermining: No Wound Description Classification: Partial Thickness Wound Margin: Flat and Intact Exudate Amount: Large Exudate Type: Serous Exudate Color: amber Foul Odor After Cleansing: No Slough/Fibrino No Wound Bed Granulation Amount: None Present (0%) Exposed Structure Necrotic Amount: Large (67-100%) Fascia Exposed: No Necrotic Quality: Eschar, Adherent Slough Fat Layer (Subcutaneous Tissue) Exposed: No Tendon Exposed: No Muscle Exposed: No Joint Exposed: No Bone Exposed: No Periwound Skin Texture [name], [name] ([id number]) Texture Color No Abnormalities Noted: No No Abnormalities Noted: No Callus: No Atrophie Blanche: No Crepitus: No Cyanosis: No Excoriation: No Ecchymosis: No Induration: No Erythema: No Rash: No Hemosiderin Staining: Yes Scarring: No Mottled: No Pallor: No Moisture Rubor: No No Abnormalities Noted: No Dry / Scaly: No Temperature / Pain Maceration: No Temperature: No Abnormality Wound Preparation Ulcer Cleansing: Rinsed/Irrigated with Saline Topical Anesthetic Applied: Other: lidocaine 4%, Electronic Signature(s) Signed: 08/27/2017 4:15:20 PM By: [name] Entered By: [name] on 08/25/2017 15:16:10 Chavero, [name] ([id number]) -------------------------------------------------------------------------------- Wound Assessment Details Patient Name: [name], [name] Date of Service: 08/25/2017 2:45 PM Medical Record Number: [id number] Patient Account Number: [id number] Date of Birth/Sex: 07/25/1960 (57 y.o. Male) Treating RN: [name] Primary Care Journie Howson: [name] Other Clinician: Referring Kasarah Sitts: [name] Treating Leul Narramore/Extender: [name], [name]  in Treatment: 3 Wound Status Wound Number: 15 Primary Venous Leg Ulcer Etiology: Wound Location: Left Lower Leg - Posterior Wound Open Wounding Event: Gradually Appeared Status: Date Acquired: 08/15/2017 Comorbid Lymphedema, Sleep Apnea, Arrhythmia, Deep [name] Of Treatment: 1 History: Vein Thrombosis, Hypertension, Myocardial Clustered Wound: No Infarction, Type  II Diabetes, Neuropathy Photos Photo Uploaded By: [name] on 08/26/2017 08:12:44 Wound Measurements Length: (cm) 1 Width: (cm) 1 Depth: (cm) 0.1 Area: (cm) 0.785 Volume: (cm) 0.079 % Reduction in Area: 30.1% % Reduction in Volume: 29.5% Epithelialization: Medium (34-66%) Tunneling: No Undermining: No Wound Description Classification: Partial Thickness Wound Margin: Flat and Intact Exudate Amount: Medium Exudate Type: Serous Exudate Color: amber Foul Odor After Cleansing: No Slough/Fibrino Yes Wound Bed Granulation Amount: Small (1-33%) Exposed Structure Granulation Quality: Red Fascia Exposed: No Necrotic Amount: Large (67-100%) Fat Layer (Subcutaneous Tissue) Exposed: No Necrotic Quality: Eschar, Adherent Slough Tendon Exposed: No Muscle Exposed: No Joint Exposed: No Bone Exposed: No Periwound Skin Texture [name], Armaan P. ([id number]) Texture Color No Abnormalities Noted: No No Abnormalities Noted: No Callus: No Atrophie Blanche: No Crepitus: No Cyanosis: No Excoriation: No Ecchymosis: No Induration: No Erythema: No Rash: No Hemosiderin Staining: Yes Scarring: No Mottled: No Pallor: No Moisture Rubor: No No Abnormalities Noted: No Dry / Scaly: No Temperature / Pain Maceration: No Temperature: No Abnormality Wound Preparation Ulcer Cleansing: Rinsed/Irrigated with Saline Topical Anesthetic Applied: Other: lidocaine 4%, Electronic Signature(s) Signed: 08/27/2017 4:15:20 PM By: [name] Entered By: [name] on 08/25/2017 15:18:12 Lorenzetti, [name]  ([id number]) -------------------------------------------------------------------------------- Wound Assessment Details Patient Name: [name], [name] Date of Service: 08/25/2017 2:45 PM Medical Record Number: [id number] Patient Account Number: [id number] Date of Birth/Sex: May 25, 1961 (57 y.o. Male) Treating RN: [name] Primary Care Kaiyu Mirabal: [name] Other Clinician: Referring Yosselyn Tax: [name] Treating Pama Roskos/Extender: [name], [name] in Treatment: 3 Wound Status Wound Number: 16 Primary Venous Leg Ulcer Etiology: Wound Location: Right Lower Leg - Anterior Wound Open Wounding Event: Gradually Appeared Status: Date Acquired: 08/15/2017 Comorbid Lymphedema, Sleep Apnea, Arrhythmia, Deep [name] Of Treatment: 1 History: Vein Thrombosis, Hypertension, Myocardial Clustered Wound: No Infarction, Type II Diabetes, Neuropathy Photos Photo Uploaded By: [name] on 08/26/2017 08:13:16 Wound Measurements Length: (cm) 3.5 Width: (cm) 3 Depth: (cm) 0.1 Area: (cm) 8.247 Volume: (cm) 0.825 % Reduction in Area: -40% % Reduction in Volume: -40.1% Epithelialization: None Tunneling: No Undermining: No Wound Description Classification: Partial Thickness Wound Margin: Flat and Intact Exudate Amount: Large Exudate Type: Serous Exudate Color: amber Foul Odor After Cleansing: No Slough/Fibrino No Wound Bed Granulation Amount: Medium (34-66%) Exposed Structure Granulation Quality: Red Fascia Exposed: No Necrotic Amount: Medium (34-66%) Fat Layer (Subcutaneous Tissue) Exposed: No Necrotic Quality: Eschar Tendon Exposed: No Muscle Exposed: No Joint Exposed: No Bone Exposed: No Periwound Skin Texture Kowalczyk, Arnoldo P. ([id number]) Texture Color No Abnormalities Noted: No No Abnormalities Noted: No Callus: No Atrophie Blanche: No Crepitus: No Cyanosis: No Excoriation: No Ecchymosis: No Induration: No Erythema: No Rash: No Hemosiderin Staining:  Yes Scarring: No Mottled: No Pallor: No Moisture Rubor: No No Abnormalities Noted: No Dry / Scaly: No Temperature / Pain Maceration: No Temperature: No Abnormality Wound Preparation Ulcer Cleansing: Rinsed/Irrigated with Saline Topical Anesthetic Applied: Other: lidocaine 4%, Electronic Signature(s) Signed: 08/27/2017 4:15:20 PM By: [name] Entered By: [name] on 08/25/2017 15:19:07 Vig, [name] ([id number]) -------------------------------------------------------------------------------- Wound Assessment Details Patient Name: [name], [name] Date of Service: 08/25/2017 2:45 PM Medical Record Number: [id number] Patient Account Number: [id number] Date of Birth/Sex: 1961/03/25 (57 y.o. Male) Treating RN: [name] Primary Care Kailin Principato: [name] Other Clinician: Referring Clorine Swing: [name] Treating Gaelen Brager/Extender: [name], [name] in Treatment: 3 Wound Status Wound Number: 17 Primary Skin Tear Etiology: Wound Location: Right Hand - 2nd Digit Wound Open Wounding Event: Blister Status: Date Acquired: 08/21/2017 Comorbid  Lymphedema, Sleep Apnea, Arrhythmia, Deep [name] Of Treatment: 0 History: Vein Thrombosis, Hypertension, Myocardial Clustered Wound: No Infarction, Type II Diabetes, Neuropathy Photos Photo Uploaded By: [name] on 08/26/2017 08:13:37 Wound Measurements Length: (cm) 1 Width: (cm) 0.7 Depth: (cm) 0.1 Area: (cm) 0.55 Volume: (cm) 0.055 % Reduction in Area: % Reduction in Volume: Epithelialization: None Tunneling: No Undermining: No Wound Description Full Thickness Without Exposed Support Classification: Structures Wound Margin: Flat and Intact Exudate Large Amount: Exudate Type: Serosanguineous Exudate Color: red, brown Foul Odor After Cleansing: No Slough/Fibrino No Wound Bed Granulation Amount: Large (67-100%) Exposed Structure Granulation Quality: Red Fascia Exposed: No Necrotic Amount:  None Present (0%) Fat Layer (Subcutaneous Tissue) Exposed: No Tendon Exposed: No Muscle Exposed: No Joint Exposed: No Bone Exposed: No Marcelli, Roshan P. ([id number]) Periwound Skin Texture Texture Color No Abnormalities Noted: No No Abnormalities Noted: No Moisture Temperature / Pain No Abnormalities Noted: No Temperature: No Abnormality Maceration: Yes Wound Preparation Ulcer Cleansing: Rinsed/Irrigated with Saline Topical Anesthetic Applied: Other: lidocaine 4%, Electronic Signature(s) Signed: 08/27/2017 4:15:20 PM By: [name] Entered By: [name] on 08/25/2017 15:10:35 Derusha, [name] ([id number]) -------------------------------------------------------------------------------- Vitals Details Patient Name: Carbin, Caulder P. Date of Service: 08/25/2017 2:45 PM Medical Record Number: [id number] Patient Account Number: [id number] Date of Birth/Sex: 05/21/1961 (57 y.o. Male) Treating RN: [name] Primary Care Demetruis Depaul: [name] Other Clinician: Referring Malaia Buchta: [name] Treating Aijalon Kirtz/Extender: [name], [name] in Treatment: 3 Vital Signs Time Taken: 15:01 Temperature (F): 98.4 Height (in): 74 Pulse (bpm): 97 Weight (lbs): 310 Respiratory Rate (breaths/min): 20 Body Mass Index (BMI): 39.8 Blood Pressure (mmHg): 104/61 Reference Range: 80 - 120 mg / dl Electronic Signature(s) Signed: 08/27/2017 4:15:20 PM By: [name] Entered By: [name] on 08/25/2017 15:03:35

## 2017-08-29 NOTE — Progress Notes (Signed)
[employer] [facility] [facility] [place] [facility]   PATIENT NAME: [name]    MR#:  [id number]  DATE OF BIRTH:  1960-08-29  SUBJECTIVE:  CHIEF COMPLAINT:   Chief Complaint  Patient presents with  . Chest Pain  . Shortness of Breath  No events overnight, case discussed with nephrology  REVIEW OF SYSTEMS:  CONSTITUTIONAL: No fever, fatigue or weakness.  EYES: No blurred or double vision.  EARS, NOSE, AND THROAT: No tinnitus or ear pain.  RESPIRATORY: No cough, shortness of breath, wheezing or hemoptysis.  CARDIOVASCULAR: No chest pain, orthopnea, edema.  GASTROINTESTINAL: No nausea, vomiting, diarrhea or abdominal pain.  GENITOURINARY: No dysuria, hematuria.  ENDOCRINE: No polyuria, nocturia,  HEMATOLOGY: No anemia, easy bruising or bleeding SKIN: No rash or lesion. MUSCULOSKELETAL: No joint pain or arthritis.   NEUROLOGIC: No tingling, numbness, weakness.  PSYCHIATRY: No anxiety or depression.   ROS  DRUG ALLERGIES:   Allergies  Allergen Reactions  . Cephalexin     Other reaction(s): Unknown  . Hctz  [Hydrochlorothiazide]     Other reaction(s): Other (See Comments) hyponatremia  . Hydralazine   . Penicillins Hives    Has patient had a PCN reaction causing immediate rash, facial/tongue/throat swelling, SOB or lightheadedness with hypotension: Yes Has patient had a PCN reaction causing severe rash involving mucus membranes or skin necrosis: Yes Has patient had a PCN reaction that required hospitalization: Unknown Has patient had a PCN reaction occurring within the last 10 years: No If all of the above answers are "NO", then may proceed with Cephalosporin use.   Marland Kitchen. Reserpine   . Zaroxolyn [Metolazone]     Other reaction(s): Unknown    VITALS:  Blood pressure 135/89, pulse (!) 106, temperature 98.2 F (36.8 C), temperature source Oral, resp. rate (!) 31, height 6\' 2"  (1.88 m), weight (!) 137.6 kg (303 lb 5.7 oz), SpO2 92 %.  PHYSICAL EXAMINATION:  GENERAL:  57  y.o.-year-old patient lying in the bed with no acute distress.  EYES: Pupils equal, round, reactive to light and accommodation. No scleral icterus. Extraocular muscles intact.  HEENT: Head atraumatic, normocephalic. Oropharynx and nasopharynx clear.  NECK:  Supple, no jugular venous distention. No thyroid enlargement, no tenderness.  LUNGS: Normal breath sounds bilaterally, no wheezing, rales,rhonchi or crepitation. No use of accessory muscles of respiration.  CARDIOVASCULAR: S1, S2 normal. No murmurs, rubs, or gallops.  ABDOMEN: Soft, nontender, nondistended. Bowel sounds present. No organomegaly or mass.  EXTREMITIES: No pedal edema, cyanosis, or clubbing.  NEUROLOGIC: Cranial nerves II through XII are intact. Muscle strength 5/5 in all extremities. Sensation intact. Gait not checked.  PSYCHIATRIC: [name].  SKIN: No obvious rash, lesion, or ulcer.   Physical Exam LABORATORY PANEL:   CBC Recent Labs  Lab 08/29/17 0851  WBC 6.3  HGB 12.9*  HCT 38.5*  PLT 218   ------------------------------------------------------------------------------------------------------------------  Chemistries  Recent Labs  Lab 08/29/17 0851  NA 128*  K 4.4  CL 94*  CO2 26  GLUCOSE 116*  BUN 11  CREATININE 0.61  CALCIUM 9.7   ------------------------------------------------------------------------------------------------------------------  Cardiac Enzymes Recent Labs  Lab 08/27/17 2306 08/28/17 0421  TROPONINI <0.03 <0.03   ------------------------------------------------------------------------------------------------------------------  RADIOLOGY:  Dg Chest 2 View  Result Date: 08/27/2017 CLINICAL DATA:  Worsening chest pain over the past 2 hours. EXAM: CHEST - 2 VIEW COMPARISON:  07/22/2017 FINDINGS: Stable cardiomegaly and mediastinal contours. Tracheostomy tube tip terminates over the superior mediastinum, centered over the upper trachea.  No pulmonary  consolidation, effusion or pneumothorax. The visualized skeletal structures are unremarkable. IMPRESSION: No active cardiopulmonary disease.  Stable cardiomegaly. Electronically Signed   By: Tollie Eth M.D.   On: 08/27/2017 16:59    ASSESSMENT AND PLAN:  57 year old male with past medical history of morbid obesity, obstructive sleep apnea, diastolic CHF, diabetes, peripheral neuropathy, previous history of hyponatremia,history of paroxysmal atrial fibrillation/flutter, history of pulmonary embolism who presents to the hospital due to chest pain, shortness of breath and also noted to be hyponatremic.  1. Acute hyponatremia Improving  Suspect due to potomania and medication side effect Replete as needed, nephrology input appreciated -recommended reduced dose of Lasix if needed 20 mg daily  2.  Acute chest pain/shortness of breath Plan chronic trach in place Resolved Continue supplemental oxygen as needed, Ces neg. for ACS, cardiology consulted for expert opinion-cardiac catheterization not recommended  3. Atrial fibrillation/flutter Stable on metoprolol and Xarelto  4.  Chronic diabetes mellitus type 2  Stable on current regiment   5. Hx of Chronic diastolic CHF Stable Continue metoprolol, Lasix on hold given hyponatremia-in discussion with nephrology/recommended lower dose status post discharge if needed to 20 mg or so  6. Diabetic neuropathy Stable on gabapentin  7. Anxiety/depression Stable on BuSpar, Lamictal.  8. Hyperlipidemia  Stable on Atorvastatin.   9. GERD, without esophagitis PPI daily   All the records are reviewed and case discussed with Care Management/Social Workerr. Management plans discussed with the patient, family and they are in agreement.  CODE STATUS: full  TOTAL TIME TAKING CARE OF THIS PATIENT: 35 minutes.     POSSIBLE D/C IN 1-3 DAYS, DEPENDING ON CLINICAL CONDITION.   Evelena Asa Jiah Bari M.D on 08/29/2017   Between 7am to 6pm - Pager  - [phone]  After 6pm go to [email or web address] - password [email or web address]  [facility] [place] [facility]  Office  [phone]  CC: Primary care physician; Hillery Aldo, MD  Note: This dictation was prepared with Dragon dictation along with smaller phrase technology. Any transcriptional errors that result from this process are unintentional.

## 2017-08-29 NOTE — Progress Notes (Signed)
Vent on STBY

## 2017-08-30 LAB — APTT
aPTT: >160 seconds (ref 24–36)
aPTT: 93 seconds — ABNORMAL HIGH (ref 24–36)
aPTT: 81 seconds — ABNORMAL HIGH (ref 24–36)

## 2017-08-30 LAB — CBC
HEMATOCRIT: 36.4 % — AB (ref 40.0–52.0)
Hemoglobin: 12.1 g/dL — ABNORMAL LOW (ref 13.0–18.0)
MCH: 30.6 pg (ref 26.0–34.0)
MCHC: 33.3 g/dL (ref 32.0–36.0)
MCV: 91.9 fL (ref 80.0–100.0)
Platelets: 222 10*3/uL (ref 150–440)
RBC: 3.96 MIL/uL — ABNORMAL LOW (ref 4.40–5.90)
RDW: 13.1 % (ref 11.5–14.5)
WBC: 7.8 10*3/uL (ref 3.8–10.6)

## 2017-08-30 LAB — BASIC METABOLIC PANEL
Anion gap: 7 (ref 5–15)
BUN: 11 mg/dL (ref 6–20)
CHLORIDE: 98 mmol/L — AB (ref 101–111)
CO2: 25 mmol/L (ref 22–32)
CREATININE: 0.75 mg/dL (ref 0.61–1.24)
Calcium: 9.6 mg/dL (ref 8.9–10.3)
GFR calc Af Amer: >60 mL/min (ref >60)
GFR calc non Af Amer: >60 mL/min (ref >60)
Glucose, Bld: 109 mg/dL — ABNORMAL HIGH (ref 65–99)
Potassium: 4.5 mmol/L (ref 3.5–5.1)
Sodium: 130 mmol/L — ABNORMAL LOW (ref 135–145)

## 2017-08-30 LAB — HEPARIN LEVEL (UNFRACTIONATED): HEPARIN UNFRACTIONATED: 0.87 [IU]/mL — AB (ref 0.30–0.70)

## 2017-08-30 LAB — GLUCOSE, CAPILLARY
GLUCOSE-CAPILLARY: 132 mg/dL — AB (ref 65–99)
Glucose-Capillary: 139 mg/dL — ABNORMAL HIGH (ref 65–99)
Glucose-Capillary: 114 mg/dL — ABNORMAL HIGH (ref 65–99)
Glucose-Capillary: 110 mg/dL — ABNORMAL HIGH (ref 65–99)

## 2017-08-30 MED ORDER — HEPARIN (PORCINE) IN NACL 100-0.45 UNIT/ML-% IJ SOLN
1500.0000 [IU]/h | INTRAMUSCULAR | Status: DC
  Administered 2017-08-31 – 2017-09-01 (×3): 1300 [IU]/h via INTRAVENOUS
  Administered 2017-09-01 – 2017-09-02 (×2): 1500 [IU]/h via INTRAVENOUS
  Filled 2017-08-30 (×4): qty 250

## 2017-08-30 MED ORDER — APIXABAN 5 MG PO TABS: 5.0000 mg | ORAL_TABLET | Freq: Two times a day (BID) | ORAL | Status: DC

## 2017-08-30 MED ORDER — HEPARIN (PORCINE) IN NACL 100-0.45 UNIT/ML-% IJ SOLN
1300.0000 [IU]/h | INTRAMUSCULAR | Status: DC
  Administered 2017-08-30 (×2): 1300 [IU]/h via INTRAVENOUS
  Filled 2017-08-30: qty 250

## 2017-08-30 NOTE — Progress Notes (Signed)
[employer] - [place] at [facility]   PATIENT NAME: Darl PikesJim Holsonback    MR#:  [id number]  DATE OF BIRTH:  1960-12-21  SUBJECTIVE:  CHIEF COMPLAINT:   Chief Complaint  Patient presents with  . Chest Pain  . Shortness of Breath  Patient is out of bed to chair, denies any complaints.  Tolerating breakfast Admits drinking beer 3-4 cans every day and weekends heavily  REVIEW OF SYSTEMS:  CONSTITUTIONAL: No fever, fatigue or weakness.  EYES: No blurred or double vision.  EARS, NOSE, AND THROAT: No tinnitus or ear pain.  RESPIRATORY: No cough, shortness of breath, wheezing or hemoptysis.  CARDIOVASCULAR: No chest pain, orthopnea, edema.  GASTROINTESTINAL: No nausea, vomiting, diarrhea or abdominal pain.  GENITOURINARY: No dysuria, hematuria.  ENDOCRINE: No polyuria, nocturia,  HEMATOLOGY: No anemia, easy bruising or bleeding SKIN: No rash or lesion. MUSCULOSKELETAL: No joint pain or arthritis.   NEUROLOGIC: No tingling, numbness, weakness.  PSYCHIATRY: No anxiety or depression.   ROS  DRUG ALLERGIES:   Allergies  Allergen Reactions  . Cephalexin     Other reaction(s): Unknown  . Hctz  [Hydrochlorothiazide]     Other reaction(s): Other (See Comments) hyponatremia  . Hydralazine   . Penicillins Hives    Has patient had a PCN reaction causing immediate rash, facial/tongue/throat swelling, SOB or lightheadedness with hypotension: Yes Has patient had a PCN reaction causing severe rash involving mucus membranes or skin necrosis: Yes Has patient had a PCN reaction that required hospitalization: Unknown Has patient had a PCN reaction occurring within the last 10 years: No If all of the above answers are "NO", then may proceed with Cephalosporin use.   Marland Kitchen. Reserpine   . Zaroxolyn [Metolazone]     Other reaction(s): Unknown    VITALS:  Blood pressure 118/60, pulse 85, temperature 97.7 F (36.5 C), temperature source Oral, resp. rate 19, height 6\' 2"  (1.88 m), weight  (!) 137.6 kg (303 lb 5.7 oz), SpO2 94 %.  PHYSICAL EXAMINATION:  GENERAL:  57 y.o.-year-old patient lying in the bed with no acute distress.  EYES: Pupils equal, round, reactive to light and accommodation. No scleral icterus. Extraocular muscles intact.  HEENT: Head atraumatic, normocephalic. Oropharynx and nasopharynx clear.  NECK:  Supple, no jugular venous distention. No thyroid enlargement, no tenderness.  LUNGS: Normal breath sounds bilaterally, no wheezing, rales,rhonchi or crepitation. No use of accessory muscles of respiration.  CARDIOVASCULAR: S1, S2 normal. No murmurs, rubs, or gallops.  ABDOMEN: Soft, nontender, nondistended. Bowel sounds present. No organomegaly or mass.  EXTREMITIES: No pedal edema, cyanosis, or clubbing.  NEUROLOGIC: Cranial nerves II through XII are intact. Muscle strength 5/5 in all extremities. Sensation intact. Gait not checked.  PSYCHIATRIC: [name].  SKIN: No obvious rash, lesion, or ulcer.   Physical Exam LABORATORY PANEL:   CBC Recent Labs  Lab 08/30/17 0221  WBC 7.8  HGB 12.1*  HCT 36.4*  PLT 222   ------------------------------------------------------------------------------------------------------------------  Chemistries  Recent Labs  Lab 08/30/17 0221  NA 130*  K 4.5  CL 98*  CO2 25  GLUCOSE 109*  BUN 11  CREATININE 0.75  CALCIUM 9.6   ------------------------------------------------------------------------------------------------------------------  Cardiac Enzymes Recent Labs  Lab 08/27/17 2306 08/28/17 0421  TROPONINI <0.03 <0.03   ------------------------------------------------------------------------------------------------------------------  RADIOLOGY:  No results found.  ASSESSMENT AND PLAN:  57 year old male with past medical history of morbid obesity, obstructive sleep apnea, diastolic CHF, diabetes, peripheral neuropathy, previous history of hyponatremia,history of paroxysmal  atrial fibrillation/flutter, history of pulmonary embolism who presents to the hospital due to chest pain, shortness of breath and also noted to be hyponatremic.  1. Acute on chronic hyponatremia Improving ; sodium at 130 today Suspect due to potomania and Lasix side effect Replete as needed, nephrology input appreciated -recommended reduced dose of Lasix if needed 20 mg daily  2.  Acute chest pain/shortness of breath Plan chronic trach in place Resolved Continue supplemental oxygen as needed, Ces neg. for ACS, cardiology consulted for expert opinion-cardiac catheterization not recommended, but patient prefers getting cardiac catheterization while he is in the hospital will follow up with cardiology  3. Atrial fibrillation/flutter Stable on metoprolol and Xarelto is on hold for anticipated cardiac catheterization Patient is on heparin drip  4.  Chronic diabetes mellitus type 2  Stable on current regiment   5. Hx of Chronic diastolic CHF Stable Continue metoprolol, Lasix on hold given hyponatremia-in discussion with nephrology/recommended lower dose status post discharge if needed to 20 mg or so  6. Diabetic neuropathy Stable on gabapentin  7. Anxiety/depression Stable on BuSpar, Lamictal.  8. Hyperlipidemia  Stable on Atorvastatin.   9. GERD, without esophagitis PPI daily   All the records are reviewed and case discussed with Care Management/Social Workerr. Management plans discussed with the patient, family and they are in agreement.  CODE STATUS: full  TOTAL TIME TAKING CARE OF THIS PATIENT: 35 minutes.     POSSIBLE D/C IN 1-3 DAYS, DEPENDING ON CLINICAL CONDITION.   Ramonita Lab M.D on 08/30/2017   Between 7am to 6pm - Pager - [phone]  After 6pm go to [email or web address] - password [email or web address]  [facility] [facility]  Office  [phone]  CC: Primary care physician; Hillery Aldo, MD  Note: This dictation was prepared with Dragon dictation along  with smaller phrase technology. Any transcriptional errors that result from this process are unintentional.

## 2017-08-30 NOTE — Progress Notes (Signed)
ANTICOAGULATION CONSULT NOTE - Follow Up Consult  Pharmacy Consult for heparin dosing  Indication: atrial fibrillation  Allergies  Allergen Reactions  . Cephalexin     Other reaction(s): Unknown  . Hctz  [Hydrochlorothiazide]     Other reaction(s): Other (See Comments) hyponatremia  . Hydralazine   . Penicillins Hives    Has patient had a PCN reaction causing immediate rash, facial/tongue/throat swelling, SOB or lightheadedness with hypotension: Yes Has patient had a PCN reaction causing severe rash involving mucus membranes or skin necrosis: Yes Has patient had a PCN reaction that required hospitalization: Unknown Has patient had a PCN reaction occurring within the last 10 years: No If all of the above answers are "NO", then may proceed with Cephalosporin use.   Marland Kitchen. Reserpine   . Zaroxolyn [Metolazone]     Other reaction(s): Unknown    Patient Measurements: Height: 6\' 2"  (188 cm) Weight: (!) 303 lb 5.7 oz (137.6 kg) IBW/kg (Calculated) : 82.2 Heparin Dosing Weight: 113kg   Vital Signs: Temp: 98 F (36.7 C) (03/09 0755) Temp Source: Oral (03/09 0755) BP: 114/73 (03/09 1000) Pulse Rate: 82 (03/09 1000)  Labs: Recent Labs    08/27/17 1918 08/27/17 2306 08/28/17 0421 08/29/17 0851 08/29/17 1702 08/30/17 0221 08/30/17 1031  HGB  --   --  12.8* 12.9*  --  12.1*  --   HCT  --   --  37.2* 38.5*  --  36.4*  --   PLT  --   --  219 218  --  222  --   APTT  --   --   --   --  29 >160* 93*  HEPARINUNFRC  --   --   --   --   --  0.87*  --   CREATININE  --   --  0.62 0.61  --  0.75  --   TROPONINI <0.03 <0.03 <0.03  --   --   --   --     Estimated Creatinine Clearance: 154.1 mL/min (by C-G formula based on SCr of 0.75 mg/dL).   Medications:  Medications Prior to Admission  Medication Sig Dispense Refill Last Dose  . aspirin EC 81 MG tablet Take 162 mg by mouth daily as needed.   08/27/2017 at 0800  . atorvastatin (LIPITOR) 40 MG tablet Take 1 tablet (40 mg total) by  mouth daily at 6 PM. 30 tablet 1 08/26/2017 at 1800  . busPIRone (BUSPAR) 10 MG tablet Take 10 mg by mouth 2 (two) times daily as needed.    08/27/2017 at 0800  . enalapril (VASOTEC) 10 MG tablet Take 10 mg by mouth 2 (two) times daily.   08/27/2017 at 0800  . furosemide (LASIX) 80 MG tablet Take 1-2 tablets (80-160 mg total) by mouth daily.   08/26/2017 at 0800  . gabapentin (NEURONTIN) 800 MG tablet Take 800 mg by mouth 2 (two) times daily. 1 tablet twice daily   08/27/2017 at 0800  . insulin detemir (LEVEMIR) 100 unit/ml SOLN Inject 30-35 Units into the skin 2 (two) times daily.    08/26/2017 at 2000  . insulin lispro (HUMALOG) 100 UNIT/ML injection Inject 5 Units into the skin daily as needed for high blood sugar.    prn at prn  . lamoTRIgine (LAMICTAL) 150 MG tablet Take 150 mg by mouth 2 (two) times daily.    08/27/2017 at 0800  . metFORMIN (GLUCOPHAGE-XR) 500 MG 24 hr tablet Take 1,000 mg by mouth 2 (two) times daily.  08/27/2017 at 0800  . metoprolol (LOPRESSOR) 100 MG tablet Take 100 mg by mouth 2 (two) times daily. 100 mg twice daily   08/27/2017 at 0800  . omeprazole (PRILOSEC) 20 MG capsule Take 20 mg by mouth daily. Once daily   08/27/2017 at 0800  . oxyCODONE-acetaminophen (PERCOCET) 10-325 MG tablet Take 1 tablet by mouth 4 (four) times daily as needed for pain.    prn at prn  . rivaroxaban (XARELTO) 20 MG TABS tablet Take 20 mg by mouth daily.   08/27/2017 at 0800  . carbamazepine (TEGRETOL) 200 MG tablet Take 2 tablets (400 mg total) by mouth daily at 10 pm. (Patient not taking: Reported on 07/17/2017) 30 tablet 0 Not Taking at Unknown time  . collagenase (SANTYL) ointment Apply topically daily. 15 g 0 07/27/2017 at Unknown time    Assessment: Pharmacy consulted for heparin drip management for 57 yo male on rivaroxaban 20mg  daily as an outpatient. Patient is to have cardiac cath on Monday and is being transitioned to heparin drip prior to procedure. Patient's last dose of rivaroxaban was with dinner on  3/7.   Goal of Therapy:  Heparin level 0.3-0.7 units/ml aPTT 66-102 seconds Monitor platelets by anticoagulation protocol: Yes   Plan:  Will initiate patient on heparin bolus 5000 units followed by heparin drip at 1450 units/hr. Will obtain initiate aPTT level at 0200 on 3/9. Will monitor with aPTTs until anti-Xa and aPTTs correlate. Will obtain anti-Xa levels daily until correlation occurs.   3/8 0200 aPTT >160. Hold heparin x 1 hour and restart at 1300 units/hr. Next aPTT 6 hours after restart.  3/9 aPTT @ 1031= 93. Will check confirmatory aPTT level in 6 hours. F/u HL and aPTT for correlation.  Pharmacy will continue to monitor and adjust per consult.   Shandi Godfrey A 08/30/2017,11:21 AM

## 2017-08-30 NOTE — Progress Notes (Signed)
Per Dr. Lutricia Horsfallonforti's request, I spoke to Dr. Lady GaryFath (cardiologist on call for Dr. Juliann Paresallwood this weekend) and asked him if the plan of care includes pt undergoing cardiac catheterization on Monday. Dr. Lady GaryFath said per Dr. Glennis Brinkallwood's note it does not appear that the plan is to have pt undergo cardiac catheterization. Dr. Lonn Georgiaonforti notified and he stated to call pharmacy to have them transition pt back to chronic anticoagulant and d/c heparin.

## 2017-08-30 NOTE — Progress Notes (Signed)
[facility] [place], [place] 08/30/17  Subjective:   Sodium has improved to 130 this morning Urine output 1050 cc Patient is sitting up in the chair  lower extremity edema appears improved   Objective:  Vital signs in last 24 hours:  Temp:  [97.8 F (36.6 C)-98.2 F (36.8 C)] 98 F (36.7 C) (03/09 0755) Pulse Rate:  [60-117] 63 (03/09 0700) Resp:  [6-39] 18 (03/09 0700) BP: (106-146)/(65-95) 106/65 (03/09 0700) SpO2:  [90 %-99 %] 93 % (03/09 0819) FiO2 (%):  [35 %] 35 % (03/09 0400)  Weight change:  Filed Weights   08/27/17 1632 08/27/17 2015  Weight: (!) 138.3 kg (305 lb) (!) 137.6 kg (303 lb 5.7 oz)    Intake/Output:    Intake/Output Summary (Last 24 hours) at 08/30/2017 0829 Last data filed at 08/30/2017 0802 Gross per 24 hour  Intake 329.21 ml  Output 1400 ml  Net -1070.79 ml     Physical Exam: General:  Sitting up in the chair, no acute distress  HEENT  anicteric, moist oral mucous membranes  Neck  tracheostomy in place  Pulm/lungs  normal breathing effort, clear to auscultation  CVS/Heart  irregular, atrial fibrillation/flutter  Abdomen:   Soft, obese  Extremities:  trace to 1+ edema bilaterally, legs wrapped  Neurologic:  Alert, oriented  Skin:  No acute rashes          Basic Metabolic Panel:  Recent Labs  Lab 08/27/17 1636 08/28/17 0421 08/29/17 0851 08/30/17 0221  NA 117* 121* 128* 130*  K 4.9 5.0 4.4 4.5  CL 83* 88* 94* 98*  CO2 22 26 26 25   GLUCOSE 119* 118* 116* 109*  BUN 12 12 11 11   CREATININE 0.60* 0.62 0.61 0.75  CALCIUM 8.9 8.8* 9.7 9.6     CBC: Recent Labs  Lab 08/27/17 1636 08/28/17 0421 08/29/17 0851 08/30/17 0221  WBC 10.3 7.3 6.3 7.8  NEUTROABS  --   --  4.4  --   HGB 13.4 12.8* 12.9* 12.1*  HCT 39.1* 37.2* 38.5* 36.4*  MCV 90.7 90.3 91.9 91.9  PLT 237 219 218 222     No results found for: HEPBSAG, HEPBSAB, HEPBIGM    Microbiology:  Recent Results (from the past 240 hour(s))  MRSA PCR  Screening     Status: None   Collection Time: 08/27/17  8:17 PM  Result Value Ref Range Status   MRSA by PCR NEGATIVE NEGATIVE Final    Comment:        The GeneXpert MRSA Assay (FDA approved for NASAL specimens only), is one component of a comprehensive MRSA colonization surveillance program. It is not intended to diagnose MRSA infection nor to guide or monitor treatment for MRSA infections. Performed at [facility], [address] [place], [place] [address]     Coagulation Studies: No results for input(s): LABPROT, INR in the last 72 hours.  Urinalysis: No results for input(s): COLORURINE, LABSPEC, PHURINE, GLUCOSEU, HGBUR, BILIRUBINUR, KETONESUR, PROTEINUR, UROBILINOGEN, NITRITE, LEUKOCYTESUR in the last 72 hours.  Invalid input(s): APPERANCEUR    Imaging: No results found.   Medications:   . heparin 1,300 Units/hr (08/30/17 0420)   . atorvastatin  40 mg Oral q1800  . enalapril  10 mg Oral BID  . feeding supplement (ENSURE ENLIVE)  237 mL Oral BID BM  . folic acid  1 mg Oral Daily  . gabapentin  800 mg Oral BID  . insulin aspart  0-20 Units Subcutaneous TID WC  . insulin  aspart  0-5 Units Subcutaneous QHS  . insulin detemir  30 Units Subcutaneous BID  . lamoTRIgine  150 mg Oral BID  . metFORMIN  1,000 mg Oral BID WC  . metoprolol tartrate  100 mg Oral BID  . multivitamin with minerals  1 tablet Oral Daily  . pantoprazole  40 mg Oral Daily  . senna-docusate  2 tablet Oral BID  . thiamine  100 mg Oral Daily   acetaminophen **OR** acetaminophen, busPIRone, LORazepam, morphine injection, ondansetron **OR** ondansetron (ZOFRAN) IV, oxyCODONE-acetaminophen **AND** oxyCODONE, polyethylene glycol  Assessment/ Plan:  57 y.o. male with a PMHx of congestive heart failure with normal ejection fraction, paroxysmal atrial fibrillation, chronic respiratory failure status post tracheostomy placement with nocturnal ventilation, history of pulmonary embolism,  obstructive sleep apnea, diabetes mellitus type 2, peripheral neuropathy, elevated liver enzymes, GERD, hypertension, history of pancreatitis who was admitted to [facility] on 08/27/2017 for evaluation of chest pain and shortness of breath.   1.  Hyponatremia. 2.  Chronic respiratory failure with tracheostomy and nocturnal ventilation. 3.  Lower extremity edema. 4.  ETOH abuse  Workup so far includes normal TSH, low normal plasma cortisol, low serum osmolality of 259 Urine osmolality 151, urine sodium less than 10 2D echo from May 2018 shows LVEF 55-60%  Hyponatremia is multifactorial but likely related to use of large dose of Lasix as outpatient this time.  Sodium level has improved with IV saline administration.  Recommended to patient to decrease his beer consumption.  He normally drinks 3-4 beers daily Also need to follow reasonable fluid restriction.  Follows thirst If Lasix is required for LE edema,  would use low-dose of 20 mg twice daily and follow low-salt diet at home.    LOS: 3 Tashonda Pinkus 3/9/20198:29 AM  [facility] [place], [place] [phone]  Note: This note was prepared with Dragon dictation. Any transcription errors are unintentional

## 2017-08-30 NOTE — Progress Notes (Signed)
ANTICOAGULATION CONSULT NOTE - Follow Up Consult  Pharmacy Consult for heparin dosing  Indication: atrial fibrillation  Allergies  Allergen Reactions  . Cephalexin     Other reaction(s): Unknown  . Hctz  [Hydrochlorothiazide]     Other reaction(s): Other (See Comments) hyponatremia  . Hydralazine   . Penicillins Hives    Has patient had a PCN reaction causing immediate rash, facial/tongue/throat swelling, SOB or lightheadedness with hypotension: Yes Has patient had a PCN reaction causing severe rash involving mucus membranes or skin necrosis: Yes Has patient had a PCN reaction that required hospitalization: Unknown Has patient had a PCN reaction occurring within the last 10 years: No If all of the above answers are "NO", then may proceed with Cephalosporin use.   Marland Kitchen. Reserpine   . Zaroxolyn [Metolazone]     Other reaction(s): Unknown    Patient Measurements: Height: 6\' 2"  (188 cm) Weight: (!) 303 lb 5.7 oz (137.6 kg) IBW/kg (Calculated) : 82.2 Heparin Dosing Weight: 113kg   Vital Signs: Temp: 98 F (36.7 C) (03/08 1945) Temp Source: Oral (03/08 1945) BP: 114/67 (03/09 0000) Pulse Rate: 87 (03/09 0000)  Labs: Recent Labs    08/27/17 1636 08/27/17 1918 08/27/17 2306 08/28/17 0421 08/29/17 0851 08/29/17 1702 08/30/17 0221  HGB 13.4  --   --  12.8* 12.9*  --  12.1*  HCT 39.1*  --   --  37.2* 38.5*  --  36.4*  PLT 237  --   --  219 218  --  222  APTT  --   --   --   --   --  29 >160*  CREATININE 0.60*  --   --  0.62 0.61  --   --   TROPONINI <0.03 <0.03 <0.03 <0.03  --   --   --     Estimated Creatinine Clearance: 154.1 mL/min (by C-G formula based on SCr of 0.61 mg/dL).   Medications:  Medications Prior to Admission  Medication Sig Dispense Refill Last Dose  . aspirin EC 81 MG tablet Take 162 mg by mouth daily as needed.   08/27/2017 at 0800  . atorvastatin (LIPITOR) 40 MG tablet Take 1 tablet (40 mg total) by mouth daily at 6 PM. 30 tablet 1 08/26/2017 at 1800   . busPIRone (BUSPAR) 10 MG tablet Take 10 mg by mouth 2 (two) times daily as needed.    08/27/2017 at 0800  . enalapril (VASOTEC) 10 MG tablet Take 10 mg by mouth 2 (two) times daily.   08/27/2017 at 0800  . furosemide (LASIX) 80 MG tablet Take 1-2 tablets (80-160 mg total) by mouth daily.   08/26/2017 at 0800  . gabapentin (NEURONTIN) 800 MG tablet Take 800 mg by mouth 2 (two) times daily. 1 tablet twice daily   08/27/2017 at 0800  . insulin detemir (LEVEMIR) 100 unit/ml SOLN Inject 30-35 Units into the skin 2 (two) times daily.    08/26/2017 at 2000  . insulin lispro (HUMALOG) 100 UNIT/ML injection Inject 5 Units into the skin daily as needed for high blood sugar.    prn at prn  . lamoTRIgine (LAMICTAL) 150 MG tablet Take 150 mg by mouth 2 (two) times daily.    08/27/2017 at 0800  . metFORMIN (GLUCOPHAGE-XR) 500 MG 24 hr tablet Take 1,000 mg by mouth 2 (two) times daily.    08/27/2017 at 0800  . metoprolol (LOPRESSOR) 100 MG tablet Take 100 mg by mouth 2 (two) times daily. 100 mg twice daily  08/27/2017 at 0800  . omeprazole (PRILOSEC) 20 MG capsule Take 20 mg by mouth daily. Once daily   08/27/2017 at 0800  . oxyCODONE-acetaminophen (PERCOCET) 10-325 MG tablet Take 1 tablet by mouth 4 (four) times daily as needed for pain.    prn at prn  . rivaroxaban (XARELTO) 20 MG TABS tablet Take 20 mg by mouth daily.   08/27/2017 at 0800  . carbamazepine (TEGRETOL) 200 MG tablet Take 2 tablets (400 mg total) by mouth daily at 10 pm. (Patient not taking: Reported on 07/17/2017) 30 tablet 0 Not Taking at Unknown time  . collagenase (SANTYL) ointment Apply topically daily. 15 g 0 07/27/2017 at Unknown time    Assessment: Pharmacy consulted for heparin drip management for 57 yo male on rivaroxaban 20mg  daily as an outpatient. Patient is to have cardiac cath on Monday and is being transitioned to heparin drip prior to procedure. Patient's last dose of rivaroxaban was with dinner on 3/7.   Goal of Therapy:  Heparin level 0.3-0.7  units/ml aPTT 66-102 seconds Monitor platelets by anticoagulation protocol: Yes   Plan:  Will initiate patient on heparin bolus 5000 units followed by heparin drip at 1450 units/hr. Will obtain initiate aPTT level at 0200 on 3/9. Will monitor with aPTTs until anti-Xa and aPTTs correlate. Will obtain anti-Xa levels daily until correlation occurs.   3/8 0200 aPTT >160. Hold heparin x 1 hour and restart at 1300 units/hr. Next aPTT 6 hours after restart.  Pharmacy will continue to monitor and adjust per consult.   Jamiel Goncalves S 08/30/2017,3:27 AM

## 2017-08-30 NOTE — Progress Notes (Signed)
Received Critical value notice for APTT greater than 160.  Informed Osvaldo HumanMatthew McBane, [facility] of same as Pharmacy has been consulted for heparin dosing.  Received verbal to hold drip for 1 hour and new dosing orders would be forth coming.  Will continue to monitor.

## 2017-08-30 NOTE — Progress Notes (Signed)
ANTICOAGULATION CONSULT NOTE - Follow Up Consult  Pharmacy Consult for heparin dosing  Indication: atrial fibrillation  Allergies  Allergen Reactions  . Cephalexin     Other reaction(s): Unknown  . Hctz  [Hydrochlorothiazide]     Other reaction(s): Other (See Comments) hyponatremia  . Hydralazine   . Penicillins Hives    Has patient had a PCN reaction causing immediate rash, facial/tongue/throat swelling, SOB or lightheadedness with hypotension: Yes Has patient had a PCN reaction causing severe rash involving mucus membranes or skin necrosis: Yes Has patient had a PCN reaction that required hospitalization: Unknown Has patient had a PCN reaction occurring within the last 10 years: No If all of the above answers are "NO", then may proceed with Cephalosporin use.   Marland Kitchen. Reserpine   . Zaroxolyn [Metolazone]     Other reaction(s): Unknown    Patient Measurements: Height: 6\' 2"  (188 cm) Weight: (!) 303 lb 5.7 oz (137.6 kg) IBW/kg (Calculated) : 82.2 Heparin Dosing Weight: 113kg   Vital Signs: Temp: 98.7 F (37.1 C) (03/09 1627) Temp Source: Oral (03/09 1627) BP: 117/74 (03/09 1800) Pulse Rate: 84 (03/09 1800)  Labs: Recent Labs    08/27/17 1918 08/27/17 2306  08/28/17 0421 08/29/17 0851  08/30/17 0221 08/30/17 1031 08/30/17 1701  HGB  --   --    < > 12.8* 12.9*  --  12.1*  --   --   HCT  --   --   --  37.2* 38.5*  --  36.4*  --   --   PLT  --   --   --  219 218  --  222  --   --   APTT  --   --   --   --   --    < > >160* 93* 81*  HEPARINUNFRC  --   --   --   --   --   --  0.87*  --   --   CREATININE  --   --   --  0.62 0.61  --  0.75  --   --   TROPONINI <0.03 <0.03  --  <0.03  --   --   --   --   --    < > = values in this interval not displayed.    Estimated Creatinine Clearance: 154.1 mL/min (by C-G formula based on SCr of 0.75 mg/dL).   Medications:  Medications Prior to Admission  Medication Sig Dispense Refill Last Dose  . aspirin EC 81 MG tablet Take  162 mg by mouth daily as needed.   08/27/2017 at 0800  . atorvastatin (LIPITOR) 40 MG tablet Take 1 tablet (40 mg total) by mouth daily at 6 PM. 30 tablet 1 08/26/2017 at 1800  . busPIRone (BUSPAR) 10 MG tablet Take 10 mg by mouth 2 (two) times daily as needed.    08/27/2017 at 0800  . enalapril (VASOTEC) 10 MG tablet Take 10 mg by mouth 2 (two) times daily.   08/27/2017 at 0800  . furosemide (LASIX) 80 MG tablet Take 1-2 tablets (80-160 mg total) by mouth daily.   08/26/2017 at 0800  . gabapentin (NEURONTIN) 800 MG tablet Take 800 mg by mouth 2 (two) times daily. 1 tablet twice daily   08/27/2017 at 0800  . insulin detemir (LEVEMIR) 100 unit/ml SOLN Inject 30-35 Units into the skin 2 (two) times daily.    08/26/2017 at 2000  . insulin lispro (HUMALOG) 100 UNIT/ML injection Inject 5 Units  into the skin daily as needed for high blood sugar.    prn at prn  . lamoTRIgine (LAMICTAL) 150 MG tablet Take 150 mg by mouth 2 (two) times daily.    08/27/2017 at 0800  . metFORMIN (GLUCOPHAGE-XR) 500 MG 24 hr tablet Take 1,000 mg by mouth 2 (two) times daily.    08/27/2017 at 0800  . metoprolol (LOPRESSOR) 100 MG tablet Take 100 mg by mouth 2 (two) times daily. 100 mg twice daily   08/27/2017 at 0800  . omeprazole (PRILOSEC) 20 MG capsule Take 20 mg by mouth daily. Once daily   08/27/2017 at 0800  . oxyCODONE-acetaminophen (PERCOCET) 10-325 MG tablet Take 1 tablet by mouth 4 (four) times daily as needed for pain.    prn at prn  . rivaroxaban (XARELTO) 20 MG TABS tablet Take 20 mg by mouth daily.   08/27/2017 at 0800  . carbamazepine (TEGRETOL) 200 MG tablet Take 2 tablets (400 mg total) by mouth daily at 10 pm. (Patient not taking: Reported on 07/17/2017) 30 tablet 0 Not Taking at Unknown time  . collagenase (SANTYL) ointment Apply topically daily. 15 g 0 07/27/2017 at Unknown time    Assessment: Pharmacy consulted for heparin drip management for 57 yo male on rivaroxaban 20mg  daily as an outpatient. Patient is to have cardiac cath on  Monday and is being transitioned to heparin drip prior to procedure. Patient's last dose of rivaroxaban was with dinner on 3/7.   Goal of Therapy:  Heparin level 0.3-0.7 units/ml aPTT 66-102 seconds Monitor platelets by anticoagulation protocol: Yes   Plan:  Will initiate patient on heparin bolus 5000 units followed by heparin drip at 1450 units/hr. Will obtain initiate aPTT level at 0200 on 3/9. Will monitor with aPTTs until anti-Xa and aPTTs correlate. Will obtain anti-Xa levels daily until correlation occurs.   3/8 0200 aPTT >160. Hold heparin x 1 hour and restart at 1300 units/hr. Next aPTT 6 hours after restart.  3/9 aPTT @ 1031= 93. Will check confirmatory aPTT level in 6 hours. F/u HL and aPTT for correlation.  3/9 @ 1900 aPTT= 81. Continue heparin induson at current rate and f/u HL/aPTT/CBC in AM.   Pharmacy will continue to monitor and adjust per consult.   [name] D 08/30/2017,7:01 PM

## 2017-08-30 NOTE — Progress Notes (Signed)
Per Dr. Glennis Brinkallwood's note and conversation with Dr. Lady GaryFath today, a cardiac cath is not recommended at this time. Therefore, Dr. Lonn Georgiaonforti recommended we stop the pt's heparin drip and restart him on his chronic oral anticoagulant which the pt tells me is xarelto. The pt is very concerned about stopping the heparin drip because he is unclear as to whether or not he should be expecting to undergo cardiac catheterization on Monday. I explained to him the conversation that I had with Dr. Lady GaryFath and Dr. Lonn Georgiaonforti, but is requesting to talk to Dr. Lonn Georgiaonforti before we transition him from IV heparin drip to PO xarelto. Dr. Lonn Georgiaonforti notified and he stated that he would stop by to see the pt prior to leaving this evening. Pt and pharmacy also made aware.

## 2017-08-30 NOTE — Progress Notes (Signed)
[facility] Critical Care Medicine Progess Note    SYNOPSIS   57 yo male with chronic tracheostomy admitted withacute on chronic hyponatremia secondary to diuretic use and ETOH abuse, angina, and acuteon chronicrespiratory failure requiringtransfer to thestepdown unit for chronic nocturnal ventilation via tracheostomy  ASSESSMENT/PLAN   Chronic Tracheostomy. Patient on nocturnal mechanical ventilation. Thick central secretions however chest x-ray does not reveal clear pneumonia. Patient had recent bronchoscopyby and was noted. History to have a polyp. Pending removal at [facility]. This was canceled secondary to hyponatremia. Will need to be rescheduled  Acute on chronic hyponatremia secondary to diuretic use and ETOH abuse  Chronic Atrial Fibrillation. On systemic and coagulation. Being followed by cardiology.    Angina.   Hx: OSA, CHF, Diabetes Mellitus, HTN, Obesity, Hyperlipidemia   Since patient has required multiple hospitalization admissions will continue to hospitalize until sodium is within normal range. We will also check with cardiology to see if they have plans for cardiac catheterization and timing for systemic anticoagulation stopping.   VENTILATOR SETTINGS: Vent Mode: PSV FiO2 (%):  [35 %] 35 % PEEP:  [5 cmH20] 5 cmH20  INTAKE / OUTPUT:  Intake/Output Summary (Last 24 hours) at 08/30/2017 0902 Last data filed at 08/30/2017 0802 Gross per 24 hour  Intake 89.21 ml  Output 1400 ml  Net -1310.79 ml     Name: [name] MRN: [id number] DOB: [birth year]    ADMISSION DATE:  08/27/2017  SUBJECTIVE:   Patient states he still has congestion and shortness of breath. Last sodium level was 128. Initially on admission it was 117. Appreciate nephrology's input, felt to be multifactorial to include diuretic therapy, no fluid restriction along with your ingestion. Patient has had multiple discharges and readmissions over the last month  VITAL SIGNS: Temp:   [97.8 F (36.6 C)-98 F (36.7 C)] 98 F (36.7 C) (03/09 0755) Pulse Rate:  [60-117] 63 (03/09 0700) Resp:  [6-39] 18 (03/09 0700) BP: (106-146)/(65-95) 106/65 (03/09 0700) SpO2:  [90 %-99 %] 93 % (03/09 0819) FiO2 (%):  [35 %] 35 % (03/09 0400)  PHYSICAL EXAMINATION: Physical Examination:   VS: BP 106/65   Pulse 63   Temp 98 F (36.7 C) (Oral)   Resp 18   Ht 6\' 2"  (1.88 m)   Wt (!) 137.6 kg (303 lb 5.7 oz)   SpO2 93%   BMI 38.95 kg/m   General Appearance: No distress  Neuro:without focal findings, mental status normal. HEENT: PERRLA, EOM intact.tracheostomy in place Pulmonary: coarse central secretions and bronchial breath sounds   Cardiovascular tachycardia appreciated, irregular rhythm with atrial fibrillation Abdomen: Benign, Soft, non-tender. Renal:  No costovertebral tenderness  Skin:   warm, no rashes, no ecchymosis  Extremities: normal, no cyanosis, clubbing.    LABORATORY PANEL:   CBC Recent Labs  Lab 08/30/17 0221  WBC 7.8  HGB 12.1*  HCT 36.4*  PLT 222    Chemistries  Recent Labs  Lab 08/30/17 0221  NA 130*  K 4.5  CL 98*  CO2 25  GLUCOSE 109*  BUN 11  CREATININE 0.75  CALCIUM 9.6    Recent Labs  Lab 08/28/17 2116 08/29/17 0749 08/29/17 1204 08/29/17 1652 08/29/17 2130 08/30/17 0737  GLUCAP 108* 90 160* 114* 118* 132*   No results for input(s): PHART, PCO2ART, PO2ART in the last 168 hours. No results for input(s): AST, ALT, ALKPHOS, BILITOT, ALBUMIN in the last 168 hours.  Cardiac Enzymes Recent Labs  Lab 08/28/17 0421  TROPONINI <0.03  RADIOLOGY:  No results found.  [name] KindredJohn Shahram Alexopoulos, DO  08/30/2017

## 2017-08-31 LAB — BASIC METABOLIC PANEL
Anion gap: 8 (ref 5–15)
BUN: 14 mg/dL (ref 6–20)
CALCIUM: 9.9 mg/dL (ref 8.9–10.3)
CO2: 25 mmol/L (ref 22–32)
CREATININE: 0.62 mg/dL (ref 0.61–1.24)
Chloride: 97 mmol/L — ABNORMAL LOW (ref 101–111)
GFR calc non Af Amer: >60 mL/min (ref >60)
GLUCOSE: 123 mg/dL — AB (ref 65–99)
Potassium: 4.3 mmol/L (ref 3.5–5.1)
Sodium: 130 mmol/L — ABNORMAL LOW (ref 135–145)

## 2017-08-31 LAB — APTT: aPTT: 99 seconds — ABNORMAL HIGH (ref 24–36)

## 2017-08-31 LAB — GLUCOSE, CAPILLARY
GLUCOSE-CAPILLARY: 97 mg/dL (ref 65–99)
Glucose-Capillary: 93 mg/dL (ref 65–99)
Glucose-Capillary: 132 mg/dL — ABNORMAL HIGH (ref 65–99)
Glucose-Capillary: 102 mg/dL — ABNORMAL HIGH (ref 65–99)

## 2017-08-31 LAB — CBC
HCT: 34.1 % — ABNORMAL LOW (ref 40.0–52.0)
Hemoglobin: 11.6 g/dL — ABNORMAL LOW (ref 13.0–18.0)
MCH: 31.1 pg (ref 26.0–34.0)
MCHC: 33.8 g/dL (ref 32.0–36.0)
MCV: 92 fL (ref 80.0–100.0)
PLATELETS: 210 10*3/uL (ref 150–440)
RBC: 3.71 MIL/uL — AB (ref 4.40–5.90)
RDW: 13.3 % (ref 11.5–14.5)
WBC: 6.3 10*3/uL (ref 3.8–10.6)

## 2017-08-31 LAB — HEPARIN LEVEL (UNFRACTIONATED): HEPARIN UNFRACTIONATED: 0.37 [IU]/mL (ref 0.30–0.70)

## 2017-08-31 MED ORDER — GLUCERNA SHAKE PO LIQD: 237.0000 mL | Freq: Two times a day (BID) | ORAL | Status: DC

## 2017-08-31 MED ORDER — ALPRAZOLAM 0.5 MG PO TABS
0.5000 mg | ORAL_TABLET | Freq: Three times a day (TID) | ORAL | Status: DC | PRN
  Administered 2017-08-31 – 2017-09-01 (×2): 0.5 mg via ORAL
  Filled 2017-08-31 (×2): qty 1

## 2017-08-31 NOTE — Progress Notes (Signed)
[employer] [facility] [place] [facility]   PATIENT NAME: [name] PikesJim Nesheiwat    MR#:  [id number]  DATE OF BIRTH:  [birth year]  SUBJECTIVE:  CHIEF COMPLAINT:   Chief Complaint  Patient presents with  . Chest Pain  . Shortness of Breath  Patient is out of bed to chair, denies any complaints.   Admits drinking beer 3-4 cans every day and heavily during weekends   REVIEW OF SYSTEMS:  CONSTITUTIONAL: No fever, fatigue or weakness.  EYES: No blurred or double vision.  EARS, NOSE, AND THROAT: No tinnitus or ear pain.  RESPIRATORY: No cough, shortness of breath, wheezing or hemoptysis.  CARDIOVASCULAR: No chest pain, orthopnea, edema.  GASTROINTESTINAL: No nausea, vomiting, diarrhea or abdominal pain.  GENITOURINARY: No dysuria, hematuria.  ENDOCRINE: No polyuria, nocturia,  HEMATOLOGY: No anemia, easy bruising or bleeding SKIN: No rash or lesion. MUSCULOSKELETAL: No joint pain or arthritis.   NEUROLOGIC: No tingling, numbness, weakness.  PSYCHIATRY: No anxiety or depression.   ROS  DRUG ALLERGIES:   Allergies  Allergen Reactions  . Cephalexin     Other reaction(s): Unknown  . Hctz  [Hydrochlorothiazide]     Other reaction(s): Other (See Comments) hyponatremia  . Hydralazine   . Penicillins Hives    Has patient had a PCN reaction causing immediate rash, facial/tongue/throat swelling, SOB or lightheadedness with hypotension: Yes Has patient had a PCN reaction causing severe rash involving mucus membranes or skin necrosis: Yes Has patient had a PCN reaction that required hospitalization: Unknown Has patient had a PCN reaction occurring within the last 10 years: No If all of the above answers are "NO", then may proceed with Cephalosporin use.   Marland Kitchen. Reserpine   . Zaroxolyn [Metolazone]     Other reaction(s): Unknown    VITALS:  Blood pressure 130/78, pulse 61, temperature 97.8 F (36.6 C), temperature source Oral, resp. rate 10, height 6\' 2"  (1.88 m), weight (!) 137.6 kg  (303 lb 5.7 oz), SpO2 95 %.  PHYSICAL EXAMINATION:  GENERAL:  57 y.o.-year-old patient lying in the bed with no acute distress.  EYES: Pupils equal, round, reactive to light and accommodation. No scleral icterus. Extraocular muscles intact.  HEENT: Head atraumatic, normocephalic. Oropharynx and nasopharynx clear.  Chronic tracheostomy NECK:  Supple, no jugular venous distention. No thyroid enlargement, no tenderness.  LUNGS: Normal breath sounds bilaterally, no wheezing, rales,rhonchi or crepitation. No use of accessory muscles of respiration.  CARDIOVASCULAR: S1, S2 normal. No murmurs, rubs, or gallops.  ABDOMEN: Soft, nontender, nondistended. Bowel sounds present. No organomegaly or mass.  EXTREMITIES: No pedal edema, cyanosis, or clubbing.  NEUROLOGIC: Cranial nerves II through XII are intact. Muscle strength 5/5 in all extremities. Sensation intact. Gait not checked.  PSYCHIATRIC: [name].  SKIN: No obvious rash, lesion, or ulcer.   Physical Exam LABORATORY PANEL:   CBC Recent Labs  Lab 08/31/17 0537  WBC 6.3  HGB 11.6*  HCT 34.1*  PLT 210   ------------------------------------------------------------------------------------------------------------------  Chemistries  Recent Labs  Lab 08/31/17 0537  NA 130*  K 4.3  CL 97*  CO2 25  GLUCOSE 123*  BUN 14  CREATININE 0.62  CALCIUM 9.9   ------------------------------------------------------------------------------------------------------------------  Cardiac Enzymes Recent Labs  Lab 08/27/17 2306 08/28/17 0421  TROPONINI <0.03 <0.03   ------------------------------------------------------------------------------------------------------------------  RADIOLOGY:  No results found.  ASSESSMENT AND PLAN:  57 year old male with past medical history of morbid obesity, obstructive sleep apnea, diastolic CHF, diabetes, peripheral neuropathy, previous history of hyponatremia,history  of  paroxysmal atrial fibrillation/flutter, history of pulmonary embolism who presents to the hospital due to chest pain, shortness of breath and also noted to be hyponatremic.  1. Acute on chronic hyponatremia Improving ; sodium at 130 today Suspect due to potomania and Lasix side effect Replete as needed, nephrology input appreciated -recommended reduced dose of Lasix if needed 20 mg daily  2.  Acute chest pain/shortness of breath Plan chronic trach in place Resolved Continue supplemental oxygen as needed, Ces neg. for ACS, cardiology consulted for expert opinion-cardiac catheterization not recommended, but patient prefers getting cardiac catheterization while he is in the hospital will follow up with cardiology [place]- [facility] after midnight  3. Atrial fibrillation/flutter Stable on metoprolol and Xarelto is on hold for anticipated cardiac catheterization Patient is on heparin drip  4.  Chronic diabetes mellitus type 2  Stable on current regiment   5. Hx of Chronic diastolic CHF Stable Continue metoprolol, Lasix on hold given hyponatremia-in discussion with nephrology/recommended lower dose status post discharge if needed to 20 mg or so  6. Diabetic neuropathy Stable on gabapentin  7. Anxiety/depression Stable on BuSpar, Lamictal.  8. Hyperlipidemia  Stable on Atorvastatin.   9. GERD, without esophagitis PPI daily   All the records are reviewed and case discussed with Care Management/Social Workerr. Management plans discussed with the patient, family and they are in agreement.  CODE STATUS: full  TOTAL TIME TAKING CARE OF THIS PATIENT: 27  minutes.     POSSIBLE D/C IN 1-2 DAYS, DEPENDING ON CLINICAL CONDITION.   Ramonita Lab M.D on 08/31/2017   Between 7am to 6pm - Pager - [phone]  After 6pm go to [email or web address] - password [email or web address]  [facility] [place] [facility]  Office  [phone]  CC: Primary care physician; Hillery Aldo, MD  Note: This  dictation was prepared with Dragon dictation along with smaller phrase technology. Any transcriptional errors that result from this process are unintentional.

## 2017-08-31 NOTE — Progress Notes (Signed)
Pt A&O x 4, requests ativan at times d/t "stress of life" and "being sick."  No clinical manifestations of ETOH withdrawal since admission on 3/6.  CIWA DCd per Dr Lonn Georgiaonforti.  Pt can have PO Xanax PRN

## 2017-08-31 NOTE — Progress Notes (Signed)
[facility] Critical Care Medicine Progess Note    SYNOPSIS   57 yo male with chronic tracheostomy admitted withacute on chronic hyponatremia secondary to diuretic use and ETOH abuse, angina, and acuteon chronicrespiratory failure requiringtransfer to thestepdown unit for chronic nocturnal ventilation via tracheostomy  ASSESSMENT/PLAN   Chronic Tracheostomy. Patient on nocturnal mechanical ventilation. Thick central secretions however chest x-ray does not reveal clear pneumonia. Patient had recent bronchoscopyby and was noted. History to have a polyp. [facility] This was canceled secondary to hyponatremia. Will need to be rescheduled  Acute on chronic hyponatremia secondary to diuretic use and ETOH abuse. Presently 130.   Chronic Atrial Fibrillation. On systemic and coagulation. Being followed by cardiology.    Angina.   Hx: OSA, CHF, Diabetes Mellitus, HTN, Obesity, Hyperlipidemia   Since patient has required multiple hospitalization admissions will continue to hospitalize until sodium is within normal range. We will also check with cardiology to see if they have plans for cardiac catheterization and timing for systemic anticoagulation stopping.  Plan is to discuss with Dr. Juliann Paresallwood in the morning and if there is no plan for cardiac catheterization will discharge from ICU.   VENTILATOR SETTINGS: Vent Mode: PSV FiO2 (%):  [30 %] 30 %  INTAKE / OUTPUT:  Intake/Output Summary (Last 24 hours) at 08/31/2017 0928 Last data filed at 08/31/2017 0900 Gross per 24 hour  Intake 1538.8 ml  Output 1025 ml  Net 513.8 ml     Name: [name] MRN: [id number] DOB: 02-17-1961    ADMISSION DATE:  08/27/2017  SUBJECTIVE:   Patient states he still has congestion and shortness of breath. Last sodium level was 128. Initially on admission it was 117. Appreciate nephrology's input, felt to be multifactorial to include diuretic therapy, no fluid restriction along  with your ingestion. Patient has had multiple discharges and readmissions over the last month  VITAL SIGNS: Temp:  [97.7 F (36.5 C)-98.7 F (37.1 C)] 97.8 F (36.6 C) (03/10 0900) Pulse Rate:  [61-120] 64 (03/10 0900) Resp:  [11-26] 18 (03/10 0900) BP: (92-145)/(60-95) 121/79 (03/10 0900) SpO2:  [91 %-99 %] 99 % (03/10 0900) FiO2 (%):  [30 %] 30 % (03/10 0400)  PHYSICAL EXAMINATION: Physical Examination:   VS: BP 121/79 (BP Location: Left Arm)   Pulse 64   Temp 97.8 F (36.6 C) (Oral)   Resp 18   Ht 6\' 2"  (1.88 m)   Wt (!) 137.6 kg (303 lb 5.7 oz)   SpO2 99%   BMI 38.95 kg/m   General Appearance: No distress  Neuro:without focal findings, mental status normal. HEENT: PERRLA, EOM intact.tracheostomy in place Pulmonary: coarse central secretions and bronchial breath sounds   Cardiovascular tachycardia appreciated, irregular rhythm with atrial fibrillation Abdomen: Benign, Soft, non-tender. Renal:  No costovertebral tenderness  Skin:   warm, no rashes, no ecchymosis  Extremities: normal, no cyanosis, clubbing.    LABORATORY PANEL:   CBC Recent Labs  Lab 08/31/17 0537  WBC 6.3  HGB 11.6*  HCT 34.1*  PLT 210    Chemistries  Recent Labs  Lab 08/31/17 0537  NA 130*  K 4.3  CL 97*  CO2 25  GLUCOSE 123*  BUN 14  CREATININE 0.62  CALCIUM 9.9    Recent Labs  Lab 08/29/17 2130 08/30/17 0737 08/30/17 1213 08/30/17 1635 08/30/17 2212 08/31/17 0731  GLUCAP 118* 132* 139* 114* 110* 93   No results for input(s): PHART, PCO2ART, PO2ART in the last 168 hours. No results for  input(s): AST, ALT, ALKPHOS, BILITOT, ALBUMIN in the last 168 hours.  Cardiac Enzymes Recent Labs  Lab 08/28/17 0421  TROPONINI <0.03    RADIOLOGY:  No results found.  Tora Kindred, DO  08/31/2017

## 2017-08-31 NOTE — Progress Notes (Signed)
Patient with +3 pitting edema to lower extremities.  Writer strongly encouraged him to raise the footrest on his recliner to lessen his dependent edema.  Gauze wraps on LEs in place, no visible drainage.  Wound consult ordered.  He covers his trach with his finger in order to speak.  Tach ties clean and dry.  Requires assistance to urinate.  He is currently asleep in his recliner.

## 2017-08-31 NOTE — Progress Notes (Signed)
ANTICOAGULATION CONSULT NOTE - Follow Up Consult  Pharmacy Consult for heparin dosing  Indication: atrial fibrillation  Allergies  Allergen Reactions  . Cephalexin     Other reaction(s): Unknown  . Hctz  [Hydrochlorothiazide]     Other reaction(s): Other (See Comments) hyponatremia  . Hydralazine   . Penicillins Hives    Has patient had a PCN reaction causing immediate rash, facial/tongue/throat swelling, SOB or lightheadedness with hypotension: Yes Has patient had a PCN reaction causing severe rash involving mucus membranes or skin necrosis: Yes Has patient had a PCN reaction that required hospitalization: Unknown Has patient had a PCN reaction occurring within the last 10 years: No If all of the above answers are "NO", then may proceed with Cephalosporin use.   Marland Kitchen. Reserpine   . Zaroxolyn [Metolazone]     Other reaction(s): Unknown    Patient Measurements: Height: 6\' 2"  (188 cm) Weight: (!) 303 lb 5.7 oz (137.6 kg) IBW/kg (Calculated) : 82.2 Heparin Dosing Weight: 113kg   Vital Signs: Temp: 98 F (36.7 C) (03/10 0100) Temp Source: Oral (03/10 0100) BP: 112/78 (03/10 0500) Pulse Rate: 62 (03/10 0500)  Labs: Recent Labs    08/29/17 0851  08/30/17 0221 08/30/17 1031 08/30/17 1701 08/31/17 0537  HGB 12.9*  --  12.1*  --   --  11.6*  HCT 38.5*  --  36.4*  --   --  34.1*  PLT 218  --  222  --   --  210  APTT  --    < > >160* 93* 81* 99*  HEPARINUNFRC  --   --  0.87*  --   --  0.37  CREATININE 0.61  --  0.75  --   --  0.62   < > = values in this interval not displayed.    Estimated Creatinine Clearance: 154.1 mL/min (by C-G formula based on SCr of 0.62 mg/dL).   Medications:  Medications Prior to Admission  Medication Sig Dispense Refill Last Dose  . aspirin EC 81 MG tablet Take 162 mg by mouth daily as needed.   08/27/2017 at 0800  . atorvastatin (LIPITOR) 40 MG tablet Take 1 tablet (40 mg total) by mouth daily at 6 PM. 30 tablet 1 08/26/2017 at 1800  . busPIRone  (BUSPAR) 10 MG tablet Take 10 mg by mouth 2 (two) times daily as needed.    08/27/2017 at 0800  . enalapril (VASOTEC) 10 MG tablet Take 10 mg by mouth 2 (two) times daily.   08/27/2017 at 0800  . furosemide (LASIX) 80 MG tablet Take 1-2 tablets (80-160 mg total) by mouth daily.   08/26/2017 at 0800  . gabapentin (NEURONTIN) 800 MG tablet Take 800 mg by mouth 2 (two) times daily. 1 tablet twice daily   08/27/2017 at 0800  . insulin detemir (LEVEMIR) 100 unit/ml SOLN Inject 30-35 Units into the skin 2 (two) times daily.    08/26/2017 at 2000  . insulin lispro (HUMALOG) 100 UNIT/ML injection Inject 5 Units into the skin daily as needed for high blood sugar.    prn at prn  . lamoTRIgine (LAMICTAL) 150 MG tablet Take 150 mg by mouth 2 (two) times daily.    08/27/2017 at 0800  . metFORMIN (GLUCOPHAGE-XR) 500 MG 24 hr tablet Take 1,000 mg by mouth 2 (two) times daily.    08/27/2017 at 0800  . metoprolol (LOPRESSOR) 100 MG tablet Take 100 mg by mouth 2 (two) times daily. 100 mg twice daily   08/27/2017 at 0800  .  omeprazole (PRILOSEC) 20 MG capsule Take 20 mg by mouth daily. Once daily   08/27/2017 at 0800  . oxyCODONE-acetaminophen (PERCOCET) 10-325 MG tablet Take 1 tablet by mouth 4 (four) times daily as needed for pain.    prn at prn  . rivaroxaban (XARELTO) 20 MG TABS tablet Take 20 mg by mouth daily.   08/27/2017 at 0800  . carbamazepine (TEGRETOL) 200 MG tablet Take 2 tablets (400 mg total) by mouth daily at 10 pm. (Patient not taking: Reported on 07/17/2017) 30 tablet 0 Not Taking at Unknown time  . collagenase (SANTYL) ointment Apply topically daily. 15 g 0 07/27/2017 at Unknown time    Assessment: Pharmacy consulted for heparin drip management for 57 yo male on rivaroxaban 20mg  daily as an outpatient. Patient is to have cardiac cath on Monday and is being transitioned to heparin drip prior to procedure. Patient's last dose of rivaroxaban was with dinner on 3/7.   Goal of Therapy:  Heparin level 0.3-0.7 units/ml aPTT  66-102 seconds Monitor platelets by anticoagulation protocol: Yes   Plan:  Will initiate patient on heparin bolus 5000 units followed by heparin drip at 1450 units/hr. Will obtain initiate aPTT level at 0200 on 3/9. Will monitor with aPTTs until anti-Xa and aPTTs correlate. Will obtain anti-Xa levels daily until correlation occurs.   3/8 0200 aPTT >160. Hold heparin x 1 hour and restart at 1300 units/hr. Next aPTT 6 hours after restart.  3/9 aPTT @ 1031= 93. Will check confirmatory aPTT level in 6 hours. F/u HL and aPTT for correlation.  3/9 @ 1900 aPTT= 81. Continue heparin induson at current rate and f/u HL/aPTT/CBC in AM.   3/10 AM heparin level 0.37, aPTT 99. Continue current regimen.  Correlating in therapeutic range. Will follow and adjust by heparin level only. Recheck heparin level and CBC with tomorrow AM labs.  Pharmacy will continue to monitor and adjust per consult.   Kassondra Geil S 08/31/2017,6:24 AM

## 2017-09-01 ENCOUNTER — Ambulatory Visit: Payer: Medicare HMO | Admitting: Physician Assistant

## 2017-09-01 ENCOUNTER — Encounter
Admission: EM | Disposition: A | Discharge status: Home / Self Care | Payer: Self-pay | Source: Home / Self Care | Attending: Internal Medicine

## 2017-09-01 HISTORY — PX: LEFT HEART CATH AND CORONARY ANGIOGRAPHY: CATH118249

## 2017-09-01 LAB — BASIC METABOLIC PANEL
Anion gap: 11 (ref 5–15)
BUN: 17 mg/dL (ref 6–20)
CHLORIDE: 95 mmol/L — AB (ref 101–111)
CO2: 24 mmol/L (ref 22–32)
CREATININE: 0.74 mg/dL (ref 0.61–1.24)
Calcium: 9.9 mg/dL (ref 8.9–10.3)
GFR calc Af Amer: >60 mL/min (ref >60)
GFR calc non Af Amer: >60 mL/min (ref >60)
GLUCOSE: 115 mg/dL — AB (ref 65–99)
POTASSIUM: 4.6 mmol/L (ref 3.5–5.1)
Sodium: 130 mmol/L — ABNORMAL LOW (ref 135–145)

## 2017-09-01 LAB — GLUCOSE, CAPILLARY
GLUCOSE-CAPILLARY: 78 mg/dL (ref 65–99)
Glucose-Capillary: 95 mg/dL (ref 65–99)
Glucose-Capillary: 58 mg/dL — ABNORMAL LOW (ref 65–99)
Glucose-Capillary: 109 mg/dL — ABNORMAL HIGH (ref 65–99)

## 2017-09-01 LAB — CBC
HCT: 35.2 % — ABNORMAL LOW (ref 40.0–52.0)
Hemoglobin: 11.9 g/dL — ABNORMAL LOW (ref 13.0–18.0)
MCH: 30.8 pg (ref 26.0–34.0)
MCHC: 33.7 g/dL (ref 32.0–36.0)
MCV: 91.6 fL (ref 80.0–100.0)
PLATELETS: 221 10*3/uL (ref 150–440)
RBC: 3.84 MIL/uL — AB (ref 4.40–5.90)
RDW: 13.1 % (ref 11.5–14.5)
WBC: 6.2 10*3/uL (ref 3.8–10.6)

## 2017-09-01 LAB — HEPARIN LEVEL (UNFRACTIONATED)
Heparin Unfractionated: 0.26 IU/mL — ABNORMAL LOW (ref 0.30–0.70)
Heparin Unfractionated: 0.56 IU/mL (ref 0.30–0.70)

## 2017-09-01 SURGERY — LEFT HEART CATH AND CORONARY ANGIOGRAPHY
Anesthesia: Moderate Sedation

## 2017-09-01 MED ORDER — SODIUM CHLORIDE 0.9 % WEIGHT BASED INFUSION
1.0000 mL/kg/h | INTRAVENOUS | Status: AC
  Administered 2017-09-01: 1 mL/kg/h via INTRAVENOUS

## 2017-09-01 MED ORDER — SODIUM CHLORIDE 0.9% FLUSH: 3.0000 mL | INTRAVENOUS | Status: DC | PRN

## 2017-09-01 MED ORDER — FENTANYL CITRATE (PF) 100 MCG/2ML IJ SOLN
INTRAMUSCULAR | Status: AC
  Filled 2017-09-01: qty 2

## 2017-09-01 MED ORDER — SODIUM CHLORIDE 0.9 % IV SOLN: 250.0000 mL | INTRAVENOUS | Status: DC | PRN

## 2017-09-01 MED ORDER — IOPAMIDOL (ISOVUE-300) INJECTION 61%
INTRAVENOUS | Status: DC | PRN
  Administered 2017-09-01: 110 mL via INTRA_ARTERIAL

## 2017-09-01 MED ORDER — ACETAMINOPHEN 325 MG PO TABS: 650.0000 mg | ORAL_TABLET | ORAL | Status: DC | PRN

## 2017-09-01 MED ORDER — SODIUM CHLORIDE 0.9% FLUSH
3.0000 mL | Freq: Two times a day (BID) | INTRAVENOUS | Status: DC
  Administered 2017-09-01 – 2017-09-02 (×2): 3 mL via INTRAVENOUS

## 2017-09-01 MED ORDER — HEPARIN (PORCINE) IN NACL 2-0.9 UNIT/ML-% IJ SOLN
INTRAMUSCULAR | Status: AC
Start: 2017-09-01 — End: ?
  Filled 2017-09-01: qty 500

## 2017-09-01 MED ORDER — MIDAZOLAM HCL 2 MG/2ML IJ SOLN
INTRAMUSCULAR | Status: DC | PRN
  Administered 2017-09-01: 1 mg via INTRAVENOUS

## 2017-09-01 MED ORDER — MIDAZOLAM HCL 2 MG/2ML IJ SOLN
INTRAMUSCULAR | Status: AC
  Filled 2017-09-01: qty 2

## 2017-09-01 MED ORDER — HEPARIN BOLUS VIA INFUSION
1700.0000 [IU] | Freq: Once | INTRAVENOUS | Status: AC
  Administered 2017-09-01: 1700 [IU] via INTRAVENOUS
  Filled 2017-09-01: qty 1700

## 2017-09-01 MED ORDER — ONDANSETRON HCL 4 MG/2ML IJ SOLN: 4.0000 mg | Freq: Four times a day (QID) | INTRAMUSCULAR | Status: DC | PRN

## 2017-09-01 SURGICAL SUPPLY — 9 items
CATH INFINITI 5FR ANG PIGTAIL (CATHETERS) ×3 IMPLANT
CATH INFINITI 5FR JL4 (CATHETERS) ×3 IMPLANT
CATH INFINITI JR4 5F (CATHETERS) ×3 IMPLANT
DEVICE CLOSURE MYNXGRIP 5F (Vascular Products) ×3 IMPLANT
KIT MANI 3VAL PERCEP (MISCELLANEOUS) ×3 IMPLANT
NEEDLE PERC 18GX7CM (NEEDLE) ×3 IMPLANT
PACK CARDIAC CATH (CUSTOM PROCEDURE TRAY) ×3 IMPLANT
SHEATH AVANTI 5FR X 11CM (SHEATH) ×3 IMPLANT
WIRE GUIDERIGHT .035X150 (WIRE) ×3 IMPLANT

## 2017-09-01 NOTE — Progress Notes (Signed)
[employer] [facility] [facility] [place] [facility]   PATIENT NAME: [name]    MR#:  [id number]  DATE OF BIRTH:  24-Apr-1961  SUBJECTIVE:  CHIEF COMPLAINT:   Chief Complaint  Patient presents with  . Chest Pain  . Shortness of Breath  Patient is out of bed to chair, denies any complaints.  Scheduled for cardiac catheterization in the afternoon today   REVIEW OF SYSTEMS:  CONSTITUTIONAL: No fever, fatigue or weakness.  EYES: No blurred or double vision.  EARS, NOSE, AND THROAT: No tinnitus or ear pain.  RESPIRATORY: No cough, shortness of breath, wheezing or hemoptysis.  CARDIOVASCULAR: No chest pain, orthopnea, edema.  GASTROINTESTINAL: No nausea, vomiting, diarrhea or abdominal pain.  GENITOURINARY: No dysuria, hematuria.  ENDOCRINE: No polyuria, nocturia,  HEMATOLOGY: No anemia, easy bruising or bleeding SKIN: No rash or lesion. MUSCULOSKELETAL: No joint pain or arthritis.   NEUROLOGIC: No tingling, numbness, weakness.  PSYCHIATRY: No anxiety or depression.   ROS  DRUG ALLERGIES:   Allergies  Allergen Reactions  . Cephalexin     Other reaction(s): Unknown  . Hctz  [Hydrochlorothiazide]     Other reaction(s): Other (See Comments) hyponatremia  . Hydralazine   . Penicillins Hives    Has patient had a PCN reaction causing immediate rash, facial/tongue/throat swelling, SOB or lightheadedness with hypotension: Yes Has patient had a PCN reaction causing severe rash involving mucus membranes or skin necrosis: Yes Has patient had a PCN reaction that required hospitalization: Unknown Has patient had a PCN reaction occurring within the last 10 years: No If all of the above answers are "NO", then may proceed with Cephalosporin use.   Marland Kitchen Reserpine   . Zaroxolyn [Metolazone]     Other reaction(s): Unknown    VITALS:  Blood pressure 108/66, pulse 68, temperature 98 F (36.7 C), temperature source Oral, resp. rate 20, height 6\' 2"  (1.88 m), weight (!) 137.6 kg (303 lb 5.7  oz), SpO2 97 %.  PHYSICAL EXAMINATION:  GENERAL:  57 y.o.-year-old patient lying in the bed with no acute distress.  EYES: Pupils equal, round, reactive to light and accommodation. No scleral icterus. Extraocular muscles intact.  HEENT: Head atraumatic, normocephalic. Oropharynx and nasopharynx clear.  Chronic tracheostomy NECK:  Supple, no jugular venous distention. No thyroid enlargement, no tenderness.  LUNGS: Normal breath sounds bilaterally, no wheezing, rales,rhonchi or crepitation. No use of accessory muscles of respiration.  CARDIOVASCULAR: S1, S2 normal. No murmurs, rubs, or gallops.  ABDOMEN: Soft, nontender, nondistended. Bowel sounds present. No organomegaly or mass.  EXTREMITIES: No pedal edema, cyanosis, or clubbing.  NEUROLOGIC: Cranial nerves II through XII are intact. Muscle strength 5/5 in all extremities. Sensation intact. Gait not checked.  PSYCHIATRIC: [name].  SKIN: No obvious rash, lesion, or ulcer.   Physical Exam LABORATORY PANEL:   CBC Recent Labs  Lab 09/01/17 0450  WBC 6.2  HGB 11.9*  HCT 35.2*  PLT 221   ------------------------------------------------------------------------------------------------------------------  Chemistries  Recent Labs  Lab 09/01/17 0450  NA 130*  K 4.6  CL 95*  CO2 24  GLUCOSE 115*  BUN 17  CREATININE 0.74  CALCIUM 9.9   ------------------------------------------------------------------------------------------------------------------  Cardiac Enzymes Recent Labs  Lab 08/27/17 2306 08/28/17 0421  TROPONINI <0.03 <0.03   ------------------------------------------------------------------------------------------------------------------  RADIOLOGY:  No results found.  ASSESSMENT AND PLAN:  57 year old male with past medical history of morbid obesity, obstructive sleep apnea, diastolic CHF, diabetes, peripheral neuropathy, previous history of hyponatremia,history of paroxysmal  atrial  fibrillation/flutter, history of pulmonary embolism who presents to the hospital due to chest pain, shortness of breath and also noted to be hyponatremic.  1. Acute on chronic hyponatremia Improving ; sodium at 130 today.  Patient is mentating fine Suspect due to potomania and Lasix side effect Replete as needed, nephrology input appreciated -recommended reduced dose of Lasix if needed 20 mg daily  2.  Acute chest pain/shortness of breath Plan chronic trach in place Resolved Continue supplemental oxygen as needed, Ces neg. for ACS, [facility] today for cardiac cath in the afternoon  3. Atrial fibrillation/flutter Stable on metoprolol and Xarelto is on hold for anticipated cardiac catheterization Patient is on heparin drip  4.  Chronic diabetes mellitus type 2  Stable on current regiment   5. Hx of Chronic diastolic CHF Stable Continue metoprolol, Lasix on hold given hyponatremia-in discussion with nephrology/recommended lower dose status post discharge if needed to 20 mg or so  6. Diabetic neuropathy Stable on gabapentin  7. Anxiety/depression Stable on BuSpar, Lamictal.  8. Hyperlipidemia  Stable on Atorvastatin.   9. GERD, without esophagitis PPI daily   All the records are reviewed and case discussed with Care Management/Social Workerr. Management plans discussed with the patient, family and they are in agreement.  CODE STATUS: full  TOTAL TIME TAKING CARE OF THIS PATIENT: 27  minutes.     POSSIBLE D/C IN 1 DAYS, DEPENDING ON CLINICAL CONDITION.   Ramonita LabAruna Masato Pettie M.D on 09/01/2017   Between 7am to 6pm - Pager - [phone]  After 6pm go to [email or web address] - password [email or web address]  [facility]  Office  [phone]  CC: Primary care physician; Hillery AldoPatel, Sarah, MD  Note: This dictation was prepared with Dragon dictation along with smaller phrase technology. Any transcriptional errors that  result from this process are unintentional.

## 2017-09-01 NOTE — Progress Notes (Signed)
ANTICOAGULATION CONSULT NOTE - Follow Up Consult  Pharmacy Consult for heparin dosing  Indication: atrial fibrillation  Allergies  Allergen Reactions  . Cephalexin     Other reaction(s): Unknown  . Hctz  [Hydrochlorothiazide]     Other reaction(s): Other (See Comments) hyponatremia  . Hydralazine   . Penicillins Hives    Has patient had a PCN reaction causing immediate rash, facial/tongue/throat swelling, SOB or lightheadedness with hypotension: Yes Has patient had a PCN reaction causing severe rash involving mucus membranes or skin necrosis: Yes Has patient had a PCN reaction that required hospitalization: Unknown Has patient had a PCN reaction occurring within the last 10 years: No If all of the above answers are "NO", then may proceed with Cephalosporin use.   Marland Kitchen Reserpine   . Zaroxolyn [Metolazone]     Other reaction(s): Unknown    Patient Measurements: Height: 6\' 2"  (188 cm) Weight: (!) 303 lb 5.7 oz (137.6 kg) IBW/kg (Calculated) : 82.2 Heparin Dosing Weight: 113kg   Vital Signs: Temp: 98 F (36.7 C) (03/11 0200) Temp Source: Oral (03/11 0200) BP: 102/53 (03/11 0400) Pulse Rate: 63 (03/11 0400)  Labs: Recent Labs    08/30/17 0221 08/30/17 1031 08/30/17 1701 08/31/17 0537 09/01/17 0450  HGB 12.1*  --   --  11.6* 11.9*  HCT 36.4*  --   --  34.1* 35.2*  PLT 222  --   --  210 221  APTT >160* 93* 81* 99*  --   HEPARINUNFRC 0.87*  --   --  0.37 0.26*  CREATININE 0.75  --   --  0.62 0.74    Estimated Creatinine Clearance: 154.1 mL/min (by C-G formula based on SCr of 0.74 mg/dL).   Medications:  Medications Prior to Admission  Medication Sig Dispense Refill Last Dose  . aspirin EC 81 MG tablet Take 162 mg by mouth daily as needed.   08/27/2017 at 0800  . atorvastatin (LIPITOR) 40 MG tablet Take 1 tablet (40 mg total) by mouth daily at 6 PM. 30 tablet 1 08/26/2017 at 1800  . busPIRone (BUSPAR) 10 MG tablet Take 10 mg by mouth 2 (two) times daily as needed.     08/27/2017 at 0800  . enalapril (VASOTEC) 10 MG tablet Take 10 mg by mouth 2 (two) times daily.   08/27/2017 at 0800  . furosemide (LASIX) 80 MG tablet Take 1-2 tablets (80-160 mg total) by mouth daily.   08/26/2017 at 0800  . gabapentin (NEURONTIN) 800 MG tablet Take 800 mg by mouth 2 (two) times daily. 1 tablet twice daily   08/27/2017 at 0800  . insulin detemir (LEVEMIR) 100 unit/ml SOLN Inject 30-35 Units into the skin 2 (two) times daily.    08/26/2017 at 2000  . insulin lispro (HUMALOG) 100 UNIT/ML injection Inject 5 Units into the skin daily as needed for high blood sugar.    prn at prn  . lamoTRIgine (LAMICTAL) 150 MG tablet Take 150 mg by mouth 2 (two) times daily.    08/27/2017 at 0800  . metFORMIN (GLUCOPHAGE-XR) 500 MG 24 hr tablet Take 1,000 mg by mouth 2 (two) times daily.    08/27/2017 at 0800  . metoprolol (LOPRESSOR) 100 MG tablet Take 100 mg by mouth 2 (two) times daily. 100 mg twice daily   08/27/2017 at 0800  . omeprazole (PRILOSEC) 20 MG capsule Take 20 mg by mouth daily. Once daily   08/27/2017 at 0800  . oxyCODONE-acetaminophen (PERCOCET) 10-325 MG tablet Take 1 tablet by mouth 4 (  four) times daily as needed for pain.    prn at prn  . rivaroxaban (XARELTO) 20 MG TABS tablet Take 20 mg by mouth daily.   08/27/2017 at 0800  . carbamazepine (TEGRETOL) 200 MG tablet Take 2 tablets (400 mg total) by mouth daily at 10 pm. (Patient not taking: Reported on 07/17/2017) 30 tablet 0 Not Taking at Unknown time  . collagenase (SANTYL) ointment Apply topically daily. 15 g 0 07/27/2017 at Unknown time    Assessment: Pharmacy consulted for heparin drip management for 57 yo male on rivaroxaban 20mg  daily as an outpatient. Patient is to have cardiac cath on Monday and is being transitioned to heparin drip prior to procedure. Patient's last dose of rivaroxaban was with dinner on 3/7.   Goal of Therapy:  Heparin level 0.3-0.7 units/ml aPTT 66-102 seconds Monitor platelets by anticoagulation protocol: Yes   Plan:   Will initiate patient on heparin bolus 5000 units followed by heparin drip at 1450 units/hr. Will obtain initiate aPTT level at 0200 on 3/9. Will monitor with aPTTs until anti-Xa and aPTTs correlate. Will obtain anti-Xa levels daily until correlation occurs.   3/8 0200 aPTT >160. Hold heparin x 1 hour and restart at 1300 units/hr. Next aPTT 6 hours after restart.  3/9 aPTT @ 1031= 93. Will check confirmatory aPTT level in 6 hours. F/u HL and aPTT for correlation.  3/9 @ 1900 aPTT= 81. Continue heparin induson at current rate and f/u HL/aPTT/CBC in AM.   3/10 AM heparin level 0.37, aPTT 99. Continue current regimen.  Correlating in therapeutic range. Will follow and adjust by heparin level only. Recheck heparin level and CBC with tomorrow AM labs.  3/11 AM heparin level 0.26. 1700 unit bolus and increase rate to 1500 units/hr. Recheck in 6 hours.  Pharmacy will continue to monitor and adjust per consult.   Tashawna Thom S 09/01/2017,6:09 AM

## 2017-09-01 NOTE — Progress Notes (Signed)
  [facility] Critical Care Medicine Progess Note    SYNOPSIS   57 yo male with chronic tracheostomy admitted withacute on chronic hyponatremia secondary to diuretic use and ETOH abuse, angina, and acuteon chronicrespiratory failure requiringtransfer to thestepdown unit for chronic nocturnal ventilation via tracheostomy  ASSESSMENT/PLAN   Chronic Tracheostomy. Patient on nocturnal mechanical ventilation. Thick central secretions however chest x-ray does not reveal clear pneumonia. Patient had recent bronchoscopyby and was noted. History to have a polyp. [facility] This was canceled secondary to hyponatremia. Will need to be rescheduled  Acute on chronic hyponatremia secondary to diuretic use and ETOH abuse. Presently 130.   Chronic Atrial Fibrillation. On systemic and coagulation. Being followed by cardiology.    Angina. Discussed with Dr. Juliann Paresallwood this morning who is planning cardiac catheterization today   Hx: OSA, CHF, Diabetes Mellitus, HTN, Obesity, Hyperlipidemia    VENTILATOR SETTINGS: Vent Mode: PSV FiO2 (%):  [30 %] 30 %  INTAKE / OUTPUT:  Intake/Output Summary (Last 24 hours) at 09/01/2017 0808 Last data filed at 09/01/2017 0616 Gross per 24 hour  Intake 1469.47 ml  Output 1800 ml  Net -330.53 ml     Name: [name] MRN: [id number] DOB: July 25, 1960    ADMISSION DATE:  08/27/2017  SUBJECTIVE:   Patient states he still has congestion and shortness of breath. Last sodium level was 128. Initially on admission it was 117. Appreciate nephrology's input, felt to be multifactorial to include diuretic therapy, no fluid restriction along with your ingestion. Patient has had multiple discharges and readmissions over the last month  VITAL SIGNS: Temp:  [97.7 F (36.5 C)-98 F (36.7 C)] 98 F (36.7 C) (03/11 0200) Pulse Rate:  [50-108] 68 (03/11 0600) Resp:  [10-25] 20 (03/11 0600) BP: (99-150)/(53-93) 108/66 (03/11 0600) SpO2:  [93  %-99 %] 97 % (03/11 0600) FiO2 (%):  [30 %] 30 % (03/11 0404)  PHYSICAL EXAMINATION: Physical Examination:   VS: BP 108/66   Pulse 68   Temp 98 F (36.7 C) (Oral)   Resp 20   Ht 6\' 2"  (1.88 m)   Wt (!) 137.6 kg (303 lb 5.7 oz)   SpO2 97%   BMI 38.95 kg/m   General Appearance: No distress  Neuro:without focal findings, mental status normal. HEENT: PERRLA, EOM intact.tracheostomy in place Pulmonary: coarse central secretions and bronchial breath sounds   Cardiovascular tachycardia appreciated, irregular rhythm with atrial fibrillation Abdomen: Benign, Soft, non-tender. Renal:  No costovertebral tenderness  Skin:   warm, no rashes, no ecchymosis  Extremities: normal, no cyanosis, clubbing.    LABORATORY PANEL:   CBC Recent Labs  Lab 09/01/17 0450  WBC 6.2  HGB 11.9*  HCT 35.2*  PLT 221    Chemistries  Recent Labs  Lab 09/01/17 0450  NA 130*  K 4.6  CL 95*  CO2 24  GLUCOSE 115*  BUN 17  CREATININE 0.74  CALCIUM 9.9    Recent Labs  Lab 08/30/17 2212 08/31/17 0731 08/31/17 1123 08/31/17 1622 08/31/17 2125 09/01/17 0750  GLUCAP 110* 93 97 132* 102* 95   No results for input(s): PHART, PCO2ART, PO2ART in the last 168 hours. No results for input(s): AST, ALT, ALKPHOS, BILITOT, ALBUMIN in the last 168 hours.  Cardiac Enzymes Recent Labs  Lab 08/28/17 0421  TROPONINI <0.03    RADIOLOGY:  No results found.  Tora KindredJohn Laurie Penado, DO  09/01/2017

## 2017-09-01 NOTE — Progress Notes (Signed)
This RN spoke w/ Dr. Juliann Paresallwood who advised that the patient could eat breakfast, then NPO afterwards. Patient will go to cardiac cath lab at approximately 1300-1330 hours.

## 2017-09-01 NOTE — Progress Notes (Signed)
Patient clinically stable post heart cath per Dr Juliann Paresallwood, right groin without bleeding nor hematoma. Vitals stable. afib per monitor. Dr Juliann Paresallwood out to speak with patient with questions answered.denies complaints.

## 2017-09-01 NOTE — Consult Note (Addendum)
WOC Nurse wound consult note Reason for Consult: Consult requested for bilat legs.  Pt is followed by the outpatient wound care center for light compression and was due for an appointment and dressing change today. Wound type: Left leg with generalized edema, no open wounds.  Left anterior toe with partial thickness abrasion, .2X.2X.1cm, pink and moist.  Left upper calf with the same appearance; .2X.2X.1cm. Right leg with healing full thickness healing stasis ulcer.  4X2.5X.1cm, red and dry; no odor or drainage. Generalized edema. Dressing procedure/placement/frequency: Continue present plan of care as ordered by the wound care center; Aquacel to wound, then applied kerlex and coban for light compression.  Pt can followup with the outpatient wound care center after discharge.  WOC will plan to change compression wraps next Mon if he is still in the hospital at that time.  Cammie Mcgeeawn Hena Ewalt MSN, RN, CWOCN, [place], CNS [phone]

## 2017-09-01 NOTE — Progress Notes (Signed)
Patient remains clinically stable post procedure,no bleeding nor hematoma at right groin site. Denies complaints. [facility] with [facility] reviewed.

## 2017-09-02 LAB — HEPARIN LEVEL (UNFRACTIONATED)
HEPARIN UNFRACTIONATED: 0.42 [IU]/mL (ref 0.30–0.70)
HEPARIN UNFRACTIONATED: 0.33 [IU]/mL (ref 0.30–0.70)

## 2017-09-02 LAB — CARDIAC CATHETERIZATION: Cath EF Quantitative: 45 %

## 2017-09-02 LAB — CBC
HEMATOCRIT: 36.2 % — AB (ref 40.0–52.0)
Hemoglobin: 12.1 g/dL — ABNORMAL LOW (ref 13.0–18.0)
MCH: 30.8 pg (ref 26.0–34.0)
MCHC: 33.5 g/dL (ref 32.0–36.0)
MCV: 91.9 fL (ref 80.0–100.0)
Platelets: 236 10*3/uL (ref 150–440)
RBC: 3.94 MIL/uL — ABNORMAL LOW (ref 4.40–5.90)
RDW: 13 % (ref 11.5–14.5)
WBC: 6.8 10*3/uL (ref 3.8–10.6)

## 2017-09-02 LAB — GLUCOSE, CAPILLARY
GLUCOSE-CAPILLARY: 87 mg/dL (ref 65–99)
Glucose-Capillary: 134 mg/dL — ABNORMAL HIGH (ref 65–99)

## 2017-09-02 MED ORDER — OXYCODONE HCL 5 MG PO TABS
5.0000 mg | ORAL_TABLET | Freq: Once | ORAL | Status: DC
  Filled 2017-09-02: qty 1

## 2017-09-02 MED ORDER — FOLIC ACID 1 MG PO TABS: 1.0000 mg | ORAL_TABLET | Freq: Every day | ORAL | 0 refills | Status: AC

## 2017-09-02 MED ORDER — ALPRAZOLAM 0.5 MG PO TABS: 0.5000 mg | ORAL_TABLET | Freq: Three times a day (TID) | ORAL | 0 refills | Status: AC | PRN

## 2017-09-02 MED ORDER — POLYETHYLENE GLYCOL 3350 17 G PO PACK: 17.0000 g | PACK | Freq: Every day | ORAL | 0 refills | Status: DC | PRN

## 2017-09-02 MED ORDER — GABAPENTIN 800 MG PO TABS: 800.0000 mg | ORAL_TABLET | Freq: Three times a day (TID) | ORAL | 0 refills | Status: AC

## 2017-09-02 MED ORDER — FUROSEMIDE 20 MG PO TABS
20.0000 mg | ORAL_TABLET | ORAL | 0 refills | Status: DC | PRN
Start: 2017-09-02 — End: 2017-10-09

## 2017-09-02 MED ORDER — GLUCERNA SHAKE PO LIQD: 237.0000 mL | Freq: Two times a day (BID) | ORAL | 0 refills | Status: DC

## 2017-09-02 MED ORDER — SENNOSIDES-DOCUSATE SODIUM 8.6-50 MG PO TABS: 2.0000 | ORAL_TABLET | Freq: Every evening | ORAL | Status: DC | PRN

## 2017-09-02 MED ORDER — RIVAROXABAN 20 MG PO TABS
20.0000 mg | ORAL_TABLET | Freq: Every day | ORAL | Status: DC
  Administered 2017-09-02: 20 mg via ORAL
  Filled 2017-09-02: qty 1

## 2017-09-02 MED ORDER — ACETAMINOPHEN 325 MG PO TABS: 325.0000 mg | ORAL_TABLET | Freq: Four times a day (QID) | ORAL | Status: AC | PRN

## 2017-09-02 MED ORDER — ADULT MULTIVITAMIN W/MINERALS CH: 1.0000 | ORAL_TABLET | Freq: Every day | ORAL | Status: AC

## 2017-09-02 MED ORDER — THIAMINE HCL 100 MG PO TABS: 100.0000 mg | ORAL_TABLET | Freq: Every day | ORAL | 0 refills | Status: DC

## 2017-09-02 NOTE — Progress Notes (Signed)
[facility] Progess Note    SYNOPSIS   57 yo male with chronic tracheostomy admitted withacute on chronic hyponatremia secondary to diuretic use and ETOH abuse, angina, and acuteon chronicrespiratory failure requiringtransfer to thestepdown unit for chronic nocturnal ventilation via tracheostomy  ASSESSMENT/PLAN   Chronic Tracheostomy. Patient on nocturnal mechanical ventilation. Thick central secretions however chest x-ray does not reveal clear pneumonia. Patient had recent bronchoscopyby and was noted. History to have a polyp. [facility]. This was canceled secondary to hyponatremia. Will need to be rescheduled  Acute on chronic hyponatremia secondary to diuretic use and ETOH abuse. Presently 130.   Chronic Atrial Fibrillation. On systemic and coagulation. Being followed by cardiology.    Angina. Status post cardiac catheterization recommendations per Dr.Callwood   Hx: OSA, CHF, Diabetes Mellitus, HTN, Obesity, Hyperlipidemia   Patient to be discharged today  Will need follow-up with primary care provider and nephrology, Dr. Cherylann RatelLateef for ongoing sodium management  VENTILATOR SETTINGS: Vent Mode: PRVC FiO2 (%):  [28 %-30 %] 30 % Set Rate:  [18 bmp] 18 bmp Vt Set:  [710 mL-998 mL] 710 mL PEEP:  [5 cmH20] 5 cmH20 Pressure Support:  [10 cmH20] 10 cmH20  INTAKE / OUTPUT:  Intake/Output Summary (Last 24 hours) at 09/02/2017 0850 Last data filed at 09/02/2017 0600 Gross per 24 hour  Intake 1079.35 ml  Output 2490 ml  Net -1410.65 ml     Name: [name] MRN: [id number] DOB: 1960/07/29    ADMISSION DATE:  08/27/2017  SUBJECTIVE:   Patient states he still has congestion and shortness of breath. Last sodium level was 128. Initially on admission it was 117. Appreciate nephrology's input, felt to be multifactorial to include diuretic therapy, no fluid restriction along with your ingestion. Patient has had multiple discharges  and readmissions over the last month  VITAL SIGNS: Temp:  [98 F (36.7 C)-98.3 F (36.8 C)] 98.2 F (36.8 C) (03/12 0500) Pulse Rate:  [61-95] 89 (03/12 0500) Resp:  [12-37] 28 (03/12 0500) BP: (100-139)/(68-96) 130/75 (03/12 0500) SpO2:  [85 %-99 %] 96 % (03/12 0500) FiO2 (%):  [28 %-30 %] 30 % (03/12 0429)  PHYSICAL EXAMINATION: Physical Examination:   VS: BP 130/75   Pulse 89   Temp 98.2 F (36.8 C) (Oral)   Resp (!) 28   Ht 6\' 2"  (1.88 m)   Wt (!) 137.6 kg (303 lb 5.7 oz)   SpO2 96%   BMI 38.95 kg/m   General Appearance: No distress  Neuro:without focal findings, mental status normal. HEENT: PERRLA, EOM intact.tracheostomy in place Pulmonary: coarse central secretions and bronchial breath sounds   Cardiovascular tachycardia appreciated, irregular rhythm with atrial fibrillation Abdomen: Benign, Soft, non-tender. Renal:  No costovertebral tenderness  Skin:   warm, no rashes, no ecchymosis  Extremities: normal, no cyanosis, clubbing.    LABORATORY PANEL:   CBC Recent Labs  Lab 09/02/17 0506  WBC 6.8  HGB 12.1*  HCT 36.2*  PLT 236    Chemistries  Recent Labs  Lab 09/01/17 0450  NA 130*  K 4.6  CL 95*  CO2 24  GLUCOSE 115*  BUN 17  CREATININE 0.74  CALCIUM 9.9    Recent Labs  Lab 08/31/17 2125 09/01/17 0750 09/01/17 1649 09/01/17 1728 09/01/17 2145 09/02/17 0750  GLUCAP 102* 95 58* 78 109* 87   No results for input(s): PHART, PCO2ART, PO2ART in the last 168 hours. No results for input(s): AST, ALT, ALKPHOS, BILITOT, ALBUMIN in the  last 168 hours.  Cardiac Enzymes Recent Labs  Lab 08/28/17 0421  TROPONINI <0.03    RADIOLOGY:  No results found.  Tora Kindred, DO  09/02/2017

## 2017-09-02 NOTE — Progress Notes (Signed)
[employer] - [place] at [facility]   PATIENT NAME: [name]    MR#:  [id number]  DATE OF BIRTH:  1961/06/18  SUBJECTIVE:  CHIEF COMPLAINT:   Chief Complaint  Patient presents with  . Chest Pain  . Shortness of Breath  Patient is out of bed to chair, denies any complaints.  Patient had a cardiac catheterization on 09/01/2017  REVIEW OF SYSTEMS:  CONSTITUTIONAL: No fever, fatigue or weakness.  EYES: No blurred or double vision.  EARS, NOSE, AND THROAT: No tinnitus or ear pain.  RESPIRATORY: No cough, shortness of breath, wheezing or hemoptysis.  CARDIOVASCULAR: No chest pain, orthopnea, edema.  GASTROINTESTINAL: No nausea, vomiting, diarrhea or abdominal pain.  GENITOURINARY: No dysuria, hematuria.  ENDOCRINE: No polyuria, nocturia,  HEMATOLOGY: No anemia, easy bruising or bleeding SKIN: No rash or lesion. MUSCULOSKELETAL: No joint pain or arthritis.   NEUROLOGIC: No tingling, numbness, weakness.  PSYCHIATRY: No anxiety or depression.   ROS  DRUG ALLERGIES:   Allergies  Allergen Reactions  . Cephalexin     Other reaction(s): Unknown  . Hctz  [Hydrochlorothiazide]     Other reaction(s): Other (See Comments) hyponatremia  . Hydralazine   . Penicillins Hives    Has patient had a PCN reaction causing immediate rash, facial/tongue/throat swelling, SOB or lightheadedness with hypotension: Yes Has patient had a PCN reaction causing severe rash involving mucus membranes or skin necrosis: Yes Has patient had a PCN reaction that required hospitalization: Unknown Has patient had a PCN reaction occurring within the last 10 years: No If all of the above answers are "NO", then may proceed with Cephalosporin use.   Marland Kitchen Reserpine   . Zaroxolyn [Metolazone]     Other reaction(s): Unknown    VITALS:  Blood pressure 130/75, pulse 89, temperature 98.2 F (36.8 C), temperature source Oral, resp. rate (!) 28, height  (1.88 m), weight (!) 137.6 kg (303 lb 5.7 oz),  SpO2 96 %.  PHYSICAL EXAMINATION:  GENERAL:  57 y.o.-year-old patient lying in the bed with no acute distress.  EYES: Pupils equal, round, reactive to light and accommodation. No scleral icterus. Extraocular muscles intact.  HEENT: Head atraumatic, normocephalic. Oropharynx and nasopharynx clear.  Chronic tracheostomy NECK:  Supple, no jugular venous distention. No thyroid enlargement, no tenderness.  LUNGS: Normal breath sounds bilaterally, no wheezing, rales,rhonchi or crepitation. No use of accessory muscles of respiration.  CARDIOVASCULAR: S1, S2 normal. No murmurs, rubs, or gallops.  ABDOMEN: Soft, nontender, nondistended. Bowel sounds present. No organomegaly or mass.  EXTREMITIES: No pedal edema, cyanosis, or clubbing.  NEUROLOGIC: Cranial nerves II through XII are intact. Muscle strength 5/5 in all extremities. Sensation intact. Gait not checked.  PSYCHIATRIC: [name].  SKIN: No obvious rash, lesion, or ulcer.   Physical Exam LABORATORY PANEL:   CBC Recent Labs  Lab 09/02/17 0506  WBC 6.8  HGB 12.1*  HCT 36.2*  PLT 236   ------------------------------------------------------------------------------------------------------------------  Chemistries  Recent Labs  Lab 09/01/17 0450  NA 130*  K 4.6  CL 95*  CO2 24  GLUCOSE 115*  BUN 17  CREATININE 0.74  CALCIUM 9.9   ------------------------------------------------------------------------------------------------------------------  Cardiac Enzymes Recent Labs  Lab 08/27/17 2306 08/28/17 0421  TROPONINI <0.03 <0.03   ------------------------------------------------------------------------------------------------------------------  RADIOLOGY:  No results found.  ASSESSMENT AND PLAN:  57 year old male with past medical history of morbid obesity, obstructive sleep apnea, diastolic CHF, diabetes, peripheral neuropathy, previous history of hyponatremia,history of paroxysmal atrial  fibrillation/flutter,  history of pulmonary embolism who presents to the hospital due to chest pain, shortness of breath and also noted to be hyponatremic.  1. Acute on chronic hyponatremia Improving ; sodium at 130   Patient is mentating fine Suspect due to potomania and Lasix side effect Replete as needed, nephrology input appreciated -recommended reduced dose of Lasix if needed 20 mg daily  2.  Acute chest pain/shortness of breath Plan chronic trach in place Resolved Continue supplemental oxygen as needed, Ces neg. for ACS, follow up with cardiology [facility]- Patient  had a cardiac catheterization by Dr. Juliann Pares who is recommending medical management and no stents placed   3. Atrial fibrillation/flutter Stable on metoprolol and Xarelto   4.  Chronic diabetes mellitus type 2  Stable on current regiment   5. Hx of Chronic diastolic CHF Stable Continue metoprolol, Lasix on hold given hyponatremia-in discussion with nephrology/recommended lower dose status post discharge if needed to 20 mg or so  6. Diabetic neuropathy Stable on gabapentin  7. Anxiety/depression Stable on BuSpar, Lamictal.  8. Hyperlipidemia  Stable on Atorvastatin.   9. GERD, without esophagitis PPI daily   All the records are reviewed and case discussed with Care Management/Social Workerr. Management plans discussed with the patient, family and they are in agreement.  CODE STATUS: full  TOTAL TIME TAKING CARE OF THIS PATIENT: 27  minutes.     D/c today  Ramonita Lab M.D on 09/02/2017   Between 7am to 6pm - Pager - [phone]  After 6pm go to [email or web address] - password [email or web address]  [facility]  Office  [phone]  CC: Primary care physician; Hillery Aldo, MD  Note: This dictation was prepared with Dragon dictation along with smaller phrase technology. Any transcriptional errors that result from this process are unintentional.

## 2017-09-02 NOTE — Discharge Instructions (Signed)
Follow-up with primary care physician in 5-7 days Follow-up with nephrology in a week

## 2017-09-02 NOTE — Progress Notes (Signed)
ANTICOAGULATION CONSULT NOTE - Follow Up Consult  Pharmacy Consult for heparin dosing  Indication: atrial fibrillation  Allergies  Allergen Reactions  . Cephalexin     Other reaction(s): Unknown  . Hctz  [Hydrochlorothiazide]     Other reaction(s): Other (See Comments) hyponatremia  . Hydralazine   . Penicillins Hives    Has patient had a PCN reaction causing immediate rash, facial/tongue/throat swelling, SOB or lightheadedness with hypotension: Yes Has patient had a PCN reaction causing severe rash involving mucus membranes or skin necrosis: Yes Has patient had a PCN reaction that required hospitalization: Unknown Has patient had a PCN reaction occurring within the last 10 years: No If all of the above answers are "NO", then may proceed with Cephalosporin use.   Marland Kitchen. Reserpine   . Zaroxolyn [Metolazone]     Other reaction(s): Unknown    Patient Measurements: Height: 6\' 2"  (188 cm) Weight: (!) 303 lb 5.7 oz (137.6 kg) IBW/kg (Calculated) : 82.2 Heparin Dosing Weight: 113kg   Vital Signs: Temp: 98.2 F (36.8 C) (03/12 0500) Temp Source: Oral (03/12 0500) BP: 130/75 (03/12 0500) Pulse Rate: 89 (03/12 0500)  Labs: Recent Labs    08/30/17 1031 08/30/17 1701  08/31/17 0537 09/01/17 0450 09/01/17 1208 09/01/17 2358 09/02/17 0506  HGB  --   --    < > 11.6* 11.9*  --   --  12.1*  HCT  --   --   --  34.1* 35.2*  --   --  36.2*  PLT  --   --   --  210 221  --   --  236  APTT 93* 81*  --  99*  --   --   --   --   HEPARINUNFRC  --   --    < > 0.37 0.26* 0.56 0.33 0.42  CREATININE  --   --   --  0.62 0.74  --   --   --    < > = values in this interval not displayed.    Estimated Creatinine Clearance: 154.1 mL/min (by C-G formula based on SCr of 0.74 mg/dL).   Medications:  Medications Prior to Admission  Medication Sig Dispense Refill Last Dose  . aspirin EC 81 MG tablet Take 162 mg by mouth daily as needed.   08/27/2017 at 0800  . atorvastatin (LIPITOR) 40 MG tablet  Take 1 tablet (40 mg total) by mouth daily at 6 PM. 30 tablet 1 08/26/2017 at 1800  . busPIRone (BUSPAR) 10 MG tablet Take 10 mg by mouth 2 (two) times daily as needed.    08/27/2017 at 0800  . enalapril (VASOTEC) 10 MG tablet Take 10 mg by mouth 2 (two) times daily.   08/27/2017 at 0800  . furosemide (LASIX) 80 MG tablet Take 1-2 tablets (80-160 mg total) by mouth daily.   08/26/2017 at 0800  . gabapentin (NEURONTIN) 800 MG tablet Take 800 mg by mouth 2 (two) times daily. 1 tablet twice daily   08/27/2017 at 0800  . insulin detemir (LEVEMIR) 100 unit/ml SOLN Inject 30-35 Units into the skin 2 (two) times daily.    08/26/2017 at 2000  . insulin lispro (HUMALOG) 100 UNIT/ML injection Inject 5 Units into the skin daily as needed for high blood sugar.    prn at prn  . lamoTRIgine (LAMICTAL) 150 MG tablet Take 150 mg by mouth 2 (two) times daily.    08/27/2017 at 0800  . metFORMIN (GLUCOPHAGE-XR) 500 MG 24 hr tablet Take  1,000 mg by mouth 2 (two) times daily.    08/27/2017 at 0800  . metoprolol (LOPRESSOR) 100 MG tablet Take 100 mg by mouth 2 (two) times daily. 100 mg twice daily   08/27/2017 at 0800  . omeprazole (PRILOSEC) 20 MG capsule Take 20 mg by mouth daily. Once daily   08/27/2017 at 0800  . oxyCODONE-acetaminophen (PERCOCET) 10-325 MG tablet Take 1 tablet by mouth 4 (four) times daily as needed for pain.    prn at prn  . rivaroxaban (XARELTO) 20 MG TABS tablet Take 20 mg by mouth daily.   08/27/2017 at 0800  . carbamazepine (TEGRETOL) 200 MG tablet Take 2 tablets (400 mg total) by mouth daily at 10 pm. (Patient not taking: Reported on 07/17/2017) 30 tablet 0 Not Taking at Unknown time  . collagenase (SANTYL) ointment Apply topically daily. 15 g 0 07/27/2017 at Unknown time    Assessment: Pharmacy consulted for heparin drip management for 57 yo male on rivaroxaban 20mg  daily as an outpatient. Patient is to have cardiac cath on Monday and is being transitioned to heparin drip prior to procedure. Patient's last dose of  rivaroxaban was with dinner on 3/7.   Goal of Therapy:  Heparin level 0.3-0.7 units/ml aPTT 66-102 seconds Monitor platelets by anticoagulation protocol: Yes   Plan:  Transitioning patient from heparin drip to Xarelto 20mg  daily. Verified patient taking Xarelto 20mg  daily from pharmacy on record.   Cleopatra Cedar, PharmD Pharmacy Resident 09/02/2017

## 2017-09-02 NOTE — Discharge Summary (Addendum)
Discharge summary  Date of admission:  08/27/2017 Date of discharge: 09/02/2017  Hospital course:  Admission history of present illness: Darl PikesJim Hymes  is a 57 y.o. male with a known history of morbid obesity, chronic respiratory failure status post tracheostomy, history of pulmonary embolism, paroxysmal atrial fibrillation/flutter, obstructive sleep apnea, history of pancreatitis, diabetes, peripheral neuropathy who presents to the hospital due to shortness of breath/chest tightness. Patient says he was in usual state of health and developed some chest discomfort/shortness of breath and felt like his heart was racing and that he was in atrial flutter/fibrillation with rapid heart rate. He therefore came to the ER for further evaluation. Patient's heart rates in the ER were not significantly elevated, his cardiac markers were negative, but incidentally he was noted to have a sodium of 117. Patient is on diuretics and has been compliant with it he denies any nausea vomiting or diarrhea, but does admit to some poor by mouth intake over the past 2-3 days. He does drink about 3 beers daily. Given his acute hyponatremia hospitalist services were contacted further treatment and evaluation.  Over the course of his hospitalization he was seen in consultation with nephrology and cardiology. Nephrology felt that the hyponatremia was indeed multifactorial combination of beer total mania along with diuretic use. Diuretics were held, rehydration with saline. Patient also counseled to stop drinking beer along with a well-balanced diet throughout the day. Patient sodium increased to 130 and he is asymptomatic and doing well. In addition Dr. Juliann Paresallwood performed cardiac catheterization. Dr. Juliann Paresallwood has been following patient in outpatient setting so it was more efficacious to transition him to IV heparin and performed a catheterization while he was here getting his sodium corrected. Please see cardiac catheterization note and  Dr.Callwood recommendations.  Patient will be discharged today as he has all of his respiratory supplies already. [facility] with Dr. Allena KatzPatel for close follow-up of his sodium  Please see med reconciliation per Dr.Gouru  Tora KindredJohn Alfrieda Tarry, D.O.

## 2017-09-02 NOTE — Progress Notes (Signed)
ANTICOAGULATION CONSULT NOTE - Follow Up Consult  Pharmacy Consult for heparin dosing  Indication: atrial fibrillation  Allergies  Allergen Reactions  . Cephalexin     Other reaction(s): Unknown  . Hctz  [Hydrochlorothiazide]     Other reaction(s): Other (See Comments) hyponatremia  . Hydralazine   . Penicillins Hives    Has patient had a PCN reaction causing immediate rash, facial/tongue/throat swelling, SOB or lightheadedness with hypotension: Yes Has patient had a PCN reaction causing severe rash involving mucus membranes or skin necrosis: Yes Has patient had a PCN reaction that required hospitalization: Unknown Has patient had a PCN reaction occurring within the last 10 years: No If all of the above answers are "NO", then may proceed with Cephalosporin use.   Marland Kitchen Reserpine   . Zaroxolyn [Metolazone]     Other reaction(s): Unknown    Patient Measurements: Height: 6\' 2"  (188 cm) Weight: (!) 303 lb 5.7 oz (137.6 kg) IBW/kg (Calculated) : 82.2 Heparin Dosing Weight: 113kg   Vital Signs: Temp: 98.3 F (36.8 C) (03/11 2000) Temp Source: Oral (03/11 2000) BP: 127/87 (03/11 2300) Pulse Rate: 71 (03/11 2300)  Labs: Recent Labs    08/30/17 0221 08/30/17 1031 08/30/17 1701 08/31/17 0537 09/01/17 0450 09/01/17 1208 09/01/17 2358  HGB 12.1*  --   --  11.6* 11.9*  --   --   HCT 36.4*  --   --  34.1* 35.2*  --   --   PLT 222  --   --  210 221  --   --   APTT >160* 93* 81* 99*  --   --   --   HEPARINUNFRC 0.87*  --   --  0.37 0.26* 0.56 0.33  CREATININE 0.75  --   --  0.62 0.74  --   --     Estimated Creatinine Clearance: 154.1 mL/min (by C-G formula based on SCr of 0.74 mg/dL).   Medications:  Medications Prior to Admission  Medication Sig Dispense Refill Last Dose  . aspirin EC 81 MG tablet Take 162 mg by mouth daily as needed.   08/27/2017 at 0800  . atorvastatin (LIPITOR) 40 MG tablet Take 1 tablet (40 mg total) by mouth daily at 6 PM. 30 tablet 1 08/26/2017 at 1800   . busPIRone (BUSPAR) 10 MG tablet Take 10 mg by mouth 2 (two) times daily as needed.    08/27/2017 at 0800  . enalapril (VASOTEC) 10 MG tablet Take 10 mg by mouth 2 (two) times daily.   08/27/2017 at 0800  . furosemide (LASIX) 80 MG tablet Take 1-2 tablets (80-160 mg total) by mouth daily.   08/26/2017 at 0800  . gabapentin (NEURONTIN) 800 MG tablet Take 800 mg by mouth 2 (two) times daily. 1 tablet twice daily   08/27/2017 at 0800  . insulin detemir (LEVEMIR) 100 unit/ml SOLN Inject 30-35 Units into the skin 2 (two) times daily.    08/26/2017 at 2000  . insulin lispro (HUMALOG) 100 UNIT/ML injection Inject 5 Units into the skin daily as needed for high blood sugar.    prn at prn  . lamoTRIgine (LAMICTAL) 150 MG tablet Take 150 mg by mouth 2 (two) times daily.    08/27/2017 at 0800  . metFORMIN (GLUCOPHAGE-XR) 500 MG 24 hr tablet Take 1,000 mg by mouth 2 (two) times daily.    08/27/2017 at 0800  . metoprolol (LOPRESSOR) 100 MG tablet Take 100 mg by mouth 2 (two) times daily. 100 mg twice daily  08/27/2017 at 0800  . omeprazole (PRILOSEC) 20 MG capsule Take 20 mg by mouth daily. Once daily   08/27/2017 at 0800  . oxyCODONE-acetaminophen (PERCOCET) 10-325 MG tablet Take 1 tablet by mouth 4 (four) times daily as needed for pain.    prn at prn  . rivaroxaban (XARELTO) 20 MG TABS tablet Take 20 mg by mouth daily.   08/27/2017 at 0800  . carbamazepine (TEGRETOL) 200 MG tablet Take 2 tablets (400 mg total) by mouth daily at 10 pm. (Patient not taking: Reported on 07/17/2017) 30 tablet 0 Not Taking at Unknown time  . collagenase (SANTYL) ointment Apply topically daily. 15 g 0 07/27/2017 at Unknown time    Assessment: Pharmacy consulted for heparin drip management for 57 yo male on rivaroxaban 20mg  daily as an outpatient. Patient is to have cardiac cath on Monday and is being transitioned to heparin drip prior to procedure. Patient's last dose of rivaroxaban was with dinner on 3/7.   Goal of Therapy:  Heparin level 0.3-0.7  units/ml aPTT 66-102 seconds Monitor platelets by anticoagulation protocol: Yes   Plan:  Will initiate patient on heparin bolus 5000 units followed by heparin drip at 1450 units/hr. Will obtain initiate aPTT level at 0200 on 3/9. Will monitor with aPTTs until anti-Xa and aPTTs correlate. Will obtain anti-Xa levels daily until correlation occurs.   3/8 0200 aPTT >160. Hold heparin x 1 hour and restart at 1300 units/hr. Next aPTT 6 hours after restart.  3/9 aPTT @ 1031= 93. Will check confirmatory aPTT level in 6 hours. F/u HL and aPTT for correlation.  3/9 @ 1900 aPTT= 81. Continue heparin induson at current rate and f/u HL/aPTT/CBC in AM.   3/10 AM heparin level 0.37, aPTT 99. Continue current regimen.  Correlating in therapeutic range. Will follow and adjust by heparin level only. Recheck heparin level and CBC with tomorrow AM labs.  3/11 AM heparin level 0.26. 1700 unit bolus and increase rate to 1500 units/hr. Recheck in 6 hours.  03/12 0000 HL 0.33 therapeutic. Drip was stopped for 3 hours per chart. Will continue current rate and recheck HL w/ am labs along w/ CBC.  Pharmacy will continue to monitor and adjust per consult.   Thomasene Rippleavid Ranen Doolin, PharmD, BCPS Clinical Pharmacist 09/02/2017

## 2017-09-02 NOTE — Progress Notes (Signed)
Inpatient Diabetes Program Recommendations  AACE/ADA: New Consensus Statement on Inpatient Glycemic Control (2015)  Target Ranges:  Prepandial:   less than 140 mg/dL      Peak postprandial:   less than 180 mg/dL (1-2 hours)      Critically ill patients:  140 - 180 mg/dL  Results for [name], [name] (MRN [id number]) as of 09/02/2017 08:58  Ref. Range 09/01/2017 07:50 09/01/2017 16:49 09/01/2017 17:28 09/01/2017 21:45 09/02/2017 07:50  Glucose-Capillary Latest Ref Range: 65 - 99 mg/dL 95 58 (L) 78 [id number] (H) 87    Review of Glycemic Control  Diabetes history: DM2 Outpatient Diabetes medications: Levemir 30-35 units BID, Humalog 5 units daily as needed, Metformin XR 1000 BID Current orders for Inpatient glycemic control: Levemir 30 units TID, Novolog 0-20 units TID with meals, Novolog 0-5 units QHS, Metformin XR 1000 mg BID  Inpatient Diabetes Program Recommendations: Insulin - Basal: Please consider slightly decreasing Levemir to 28 units BID.  Thanks, Orlando PennerMarie Twilla Khouri, RN, MSN, CDE Diabetes Coordinator Inpatient Diabetes Program [phone] (Team Pager from 8am to 5pm)

## 2017-09-02 NOTE — Progress Notes (Signed)
ANTICOAGULATION CONSULT NOTE - Follow Up Consult  Pharmacy Consult for heparin dosing  Indication: atrial fibrillation  Allergies  Allergen Reactions  . Cephalexin     Other reaction(s): Unknown  . Hctz  [Hydrochlorothiazide]     Other reaction(s): Other (See Comments) hyponatremia  . Hydralazine   . Penicillins Hives    Has patient had a PCN reaction causing immediate rash, facial/tongue/throat swelling, SOB or lightheadedness with hypotension: Yes Has patient had a PCN reaction causing severe rash involving mucus membranes or skin necrosis: Yes Has patient had a PCN reaction that required hospitalization: Unknown Has patient had a PCN reaction occurring within the last 10 years: No If all of the above answers are "NO", then may proceed with Cephalosporin use.   Marland Kitchen. Reserpine   . Zaroxolyn [Metolazone]     Other reaction(s): Unknown    Patient Measurements: Height: 6\' 2"  (188 cm) Weight: (!) 303 lb 5.7 oz (137.6 kg) IBW/kg (Calculated) : 82.2 Heparin Dosing Weight: 113kg   Vital Signs: Temp: 98.2 F (36.8 C) (03/12 0500) Temp Source: Oral (03/12 0500) BP: 130/75 (03/12 0500) Pulse Rate: 89 (03/12 0500)  Labs: Recent Labs    08/30/17 1031 08/30/17 1701  08/31/17 0537 09/01/17 0450 09/01/17 1208 09/01/17 2358 09/02/17 0506  HGB  --   --    < > 11.6* 11.9*  --   --  12.1*  HCT  --   --   --  34.1* 35.2*  --   --  36.2*  PLT  --   --   --  210 221  --   --  236  APTT 93* 81*  --  99*  --   --   --   --   HEPARINUNFRC  --   --    < > 0.37 0.26* 0.56 0.33 0.42  CREATININE  --   --   --  0.62 0.74  --   --   --    < > = values in this interval not displayed.    Estimated Creatinine Clearance: 154.1 mL/min (by C-G formula based on SCr of 0.74 mg/dL).   Medications:  Medications Prior to Admission  Medication Sig Dispense Refill Last Dose  . aspirin EC 81 MG tablet Take 162 mg by mouth daily as needed.   08/27/2017 at 0800  . atorvastatin (LIPITOR) 40 MG tablet  Take 1 tablet (40 mg total) by mouth daily at 6 PM. 30 tablet 1 08/26/2017 at 1800  . busPIRone (BUSPAR) 10 MG tablet Take 10 mg by mouth 2 (two) times daily as needed.    08/27/2017 at 0800  . enalapril (VASOTEC) 10 MG tablet Take 10 mg by mouth 2 (two) times daily.   08/27/2017 at 0800  . furosemide (LASIX) 80 MG tablet Take 1-2 tablets (80-160 mg total) by mouth daily.   08/26/2017 at 0800  . gabapentin (NEURONTIN) 800 MG tablet Take 800 mg by mouth 2 (two) times daily. 1 tablet twice daily   08/27/2017 at 0800  . insulin detemir (LEVEMIR) 100 unit/ml SOLN Inject 30-35 Units into the skin 2 (two) times daily.    08/26/2017 at 2000  . insulin lispro (HUMALOG) 100 UNIT/ML injection Inject 5 Units into the skin daily as needed for high blood sugar.    prn at prn  . lamoTRIgine (LAMICTAL) 150 MG tablet Take 150 mg by mouth 2 (two) times daily.    08/27/2017 at 0800  . metFORMIN (GLUCOPHAGE-XR) 500 MG 24 hr tablet Take  1,000 mg by mouth 2 (two) times daily.    08/27/2017 at 0800  . metoprolol (LOPRESSOR) 100 MG tablet Take 100 mg by mouth 2 (two) times daily. 100 mg twice daily   08/27/2017 at 0800  . omeprazole (PRILOSEC) 20 MG capsule Take 20 mg by mouth daily. Once daily   08/27/2017 at 0800  . oxyCODONE-acetaminophen (PERCOCET) 10-325 MG tablet Take 1 tablet by mouth 4 (four) times daily as needed for pain.    prn at prn  . rivaroxaban (XARELTO) 20 MG TABS tablet Take 20 mg by mouth daily.   08/27/2017 at 0800  . carbamazepine (TEGRETOL) 200 MG tablet Take 2 tablets (400 mg total) by mouth daily at 10 pm. (Patient not taking: Reported on 07/17/2017) 30 tablet 0 Not Taking at Unknown time  . collagenase (SANTYL) ointment Apply topically daily. 15 g 0 07/27/2017 at Unknown time    Assessment: Pharmacy consulted for heparin drip management for 57 yo male on rivaroxaban 20mg  daily as an outpatient. Patient is to have cardiac cath on Monday and is being transitioned to heparin drip prior to procedure. Patient's last dose of  rivaroxaban was with dinner on 3/7.   Goal of Therapy:  Heparin level 0.3-0.7 units/ml aPTT 66-102 seconds Monitor platelets by anticoagulation protocol: Yes   Plan:  Will initiate patient on heparin bolus 5000 units followed by heparin drip at 1450 units/hr. Will obtain initiate aPTT level at 0200 on 3/9. Will monitor with aPTTs until anti-Xa and aPTTs correlate. Will obtain anti-Xa levels daily until correlation occurs.   3/8 0200 aPTT >160. Hold heparin x 1 hour and restart at 1300 units/hr. Next aPTT 6 hours after restart.  3/9 aPTT @ 1031= 93. Will check confirmatory aPTT level in 6 hours. F/u HL and aPTT for correlation.  3/9 @ 1900 aPTT= 81. Continue heparin induson at current rate and f/u HL/aPTT/CBC in AM.   3/10 AM heparin level 0.37, aPTT 99. Continue current regimen.  Correlating in therapeutic range. Will follow and adjust by heparin level only. Recheck heparin level and CBC with tomorrow AM labs.  3/11 AM heparin level 0.26. 1700 unit bolus and increase rate to 1500 units/hr. Recheck in 6 hours.  03/12 0000 HL 0.33 therapeutic. Drip was stopped for 3 hours per chart. Will continue current rate and recheck HL w/ am labs along w/ CBC.  03/12 @ 0500 HL 0.42 therapeutic. Will continue current rate and will recheck HL w/ am labs.  Pharmacy will continue to monitor and adjust per consult.   Thomasene Rippleavid Kema Santaella, PharmD, BCPS Clinical Pharmacist 09/02/2017

## 2017-09-03 ENCOUNTER — Encounter: Payer: Self-pay | Admitting: Internal Medicine

## 2017-09-05 ENCOUNTER — Encounter: Payer: Medicare HMO | Admitting: Physician Assistant

## 2017-09-05 DIAGNOSIS — Z955 Presence of coronary angioplasty implant and graft: Secondary | ICD-10-CM | POA: Diagnosis not present

## 2017-09-05 DIAGNOSIS — Y92480 Sidewalk as the place of occurrence of the external cause: Secondary | ICD-10-CM | POA: Diagnosis not present

## 2017-09-05 DIAGNOSIS — S61200A Unspecified open wound of right index finger without damage to nail, initial encounter: Secondary | ICD-10-CM | POA: Diagnosis not present

## 2017-09-05 DIAGNOSIS — Z86718 Personal history of other venous thrombosis and embolism: Secondary | ICD-10-CM | POA: Diagnosis not present

## 2017-09-05 DIAGNOSIS — E11622 Type 2 diabetes mellitus with other skin ulcer: Secondary | ICD-10-CM | POA: Diagnosis not present

## 2017-09-05 DIAGNOSIS — I890 Lymphedema, not elsewhere classified: Secondary | ICD-10-CM | POA: Diagnosis not present

## 2017-09-05 DIAGNOSIS — L97521 Non-pressure chronic ulcer of other part of left foot limited to breakdown of skin: Secondary | ICD-10-CM | POA: Diagnosis not present

## 2017-09-05 DIAGNOSIS — E1140 Type 2 diabetes mellitus with diabetic neuropathy, unspecified: Secondary | ICD-10-CM | POA: Diagnosis not present

## 2017-09-05 DIAGNOSIS — W19XXXA Unspecified fall, initial encounter: Secondary | ICD-10-CM | POA: Diagnosis not present

## 2017-09-05 DIAGNOSIS — E11621 Type 2 diabetes mellitus with foot ulcer: Secondary | ICD-10-CM | POA: Diagnosis present

## 2017-09-05 DIAGNOSIS — I482 Chronic atrial fibrillation: Secondary | ICD-10-CM | POA: Diagnosis not present

## 2017-09-05 DIAGNOSIS — L97821 Non-pressure chronic ulcer of other part of left lower leg limited to breakdown of skin: Secondary | ICD-10-CM | POA: Diagnosis not present

## 2017-09-05 DIAGNOSIS — Z809 Family history of malignant neoplasm, unspecified: Secondary | ICD-10-CM | POA: Diagnosis not present

## 2017-09-05 DIAGNOSIS — E669 Obesity, unspecified: Secondary | ICD-10-CM | POA: Diagnosis not present

## 2017-09-05 DIAGNOSIS — E1151 Type 2 diabetes mellitus with diabetic peripheral angiopathy without gangrene: Secondary | ICD-10-CM | POA: Diagnosis not present

## 2017-09-05 DIAGNOSIS — I5042 Chronic combined systolic (congestive) and diastolic (congestive) heart failure: Secondary | ICD-10-CM | POA: Diagnosis not present

## 2017-09-05 DIAGNOSIS — L97511 Non-pressure chronic ulcer of other part of right foot limited to breakdown of skin: Secondary | ICD-10-CM | POA: Diagnosis not present

## 2017-09-05 DIAGNOSIS — Z6839 Body mass index (BMI) 39.0-39.9, adult: Secondary | ICD-10-CM | POA: Diagnosis not present

## 2017-09-05 DIAGNOSIS — J449 Chronic obstructive pulmonary disease, unspecified: Secondary | ICD-10-CM | POA: Diagnosis not present

## 2017-09-05 DIAGNOSIS — I110 Hypertensive heart disease with heart failure: Secondary | ICD-10-CM | POA: Diagnosis not present

## 2017-09-05 DIAGNOSIS — Z86711 Personal history of pulmonary embolism: Secondary | ICD-10-CM | POA: Diagnosis not present

## 2017-09-05 DIAGNOSIS — I252 Old myocardial infarction: Secondary | ICD-10-CM | POA: Diagnosis not present

## 2017-09-05 DIAGNOSIS — Z8249 Family history of ischemic heart disease and other diseases of the circulatory system: Secondary | ICD-10-CM | POA: Diagnosis not present

## 2017-09-05 DIAGNOSIS — F329 Major depressive disorder, single episode, unspecified: Secondary | ICD-10-CM | POA: Diagnosis not present

## 2017-09-07 NOTE — Progress Notes (Signed)
CONG, HIGHTOWER ([id number]) Visit Report for 09/05/2017 Arrival Information Details Patient Name: [name], [name]. Date of Service: 09/05/2017 2:00 PM Medical Record Number: [id number] Patient Account Number: [id number] Date of Birth/Sex: 01/27/1961 (57 y.o. Male) Treating RN: Renne Crigler Primary Care Denica Web: Hillery Aldo Other Clinician: Referring Catlynn Grondahl: Hillery Aldo Treating Kyndle Schlender/Extender: Linwood Dibbles, HOYT Weeks in Treatment: 4 Visit Information History Since Last Visit All ordered tests and consults were completed: No Patient Arrived: Ambulatory Added or deleted any medications: No Arrival Time: 14:19 Any new allergies or adverse reactions: No Accompanied By: self Had a fall or experienced change in No Transfer Assistance: None activities of daily living that may affect Patient Identification Verified: Yes risk of falls: Secondary Verification Process Yes Signs or symptoms of abuse/neglect since last visito No Completed: Pain Present Now: No Patient Requires Transmission-Based No Precautions: Patient Has Alerts: Yes Patient Alerts: noncompressible bilateral Electronic Signature(s) Signed: 09/05/2017 3:21:12 PM By: Renne Crigler Entered By: Renne Crigler on 09/05/2017 14:20:28 Wojnar, [name] ([id number]) -------------------------------------------------------------------------------- Clinic Level of Care Assessment Details Patient Name: Sawhney, [name] Date of Service: 09/05/2017 2:00 PM Medical Record Number: [id number] Patient Account Number: [id number] Date of Birth/Sex: Nov 07, 1960 (57 y.o. Male) Treating RN: Curtis Sites Primary Care Mouhamadou Gittleman: Hillery Aldo Other Clinician: Referring Mckinzee Spirito: Hillery Aldo Treating Julyan [name]/Extender: Linwood Dibbles, HOYT Weeks in Treatment: 4 Clinic Level of Care Assessment Items TOOL 4 Quantity Score []  - Use when only an EandM is performed on FOLLOW-UP visit 0 ASSESSMENTS - Nursing Assessment / Reassessment X -  Reassessment of Co-morbidities (includes updates in patient status) 1 10 X- 1 5 Reassessment of Adherence to Treatment Plan ASSESSMENTS - Wound and Skin Assessment / Reassessment []  - Simple Wound Assessment / Reassessment - one wound 0 X- 3 5 Complex Wound Assessment / Reassessment - multiple wounds []  - 0 Dermatologic / Skin Assessment (not related to wound area) ASSESSMENTS - Focused Assessment X - Circumferential Edema Measurements - multi extremities 1 5 []  - 0 Nutritional Assessment / Counseling / Intervention X- 1 5 Lower Extremity Assessment (monofilament, tuning fork, pulses) []  - 0 Peripheral Arterial Disease Assessment (using hand held doppler) ASSESSMENTS - Ostomy and/or Continence Assessment and Care []  - Incontinence Assessment and Management 0 []  - 0 Ostomy Care Assessment and Management (repouching, etc.) PROCESS - Coordination of Care X - Simple Patient / Family Education for ongoing care 1 15 []  - 0 Complex (extensive) Patient / Family Education for ongoing care []  - 0 Staff obtains Chiropractor, Records, Test Results / Process Orders []  - 0 Staff telephones HHA, Nursing Homes / Clarify orders / etc []  - 0 Routine Transfer to another Facility (non-emergent condition) []  - 0 Routine Hospital Admission (non-emergent condition) []  - 0 New Admissions / Manufacturing engineer / Ordering NPWT, Apligraf, etc. []  - 0 Emergency Hospital Admission (emergent condition) X- 1 10 Simple Discharge Coordination Caine, Damaree P. ([id number]) []  - 0 Complex (extensive) Discharge Coordination PROCESS - Special Needs []  - Pediatric / Minor Patient Management 0 []  - 0 Isolation Patient Management []  - 0 Hearing / Language / Visual special needs []  - 0 Assessment of Community assistance (transportation, D/C planning, etc.) []  - 0 Additional assistance / Altered mentation []  - 0 Support Surface(s) Assessment (bed, cushion, seat, etc.) INTERVENTIONS - Wound Cleansing /  Measurement []  - Simple Wound Cleansing - one wound 0 X- 3 5 Complex Wound Cleansing - multiple wounds X- 1 5 Wound Imaging (photographs - any number of wounds) []  - 0 Wound Tracing (  instead of photographs) []  - 0 Simple Wound Measurement - one wound X- 3 5 Complex Wound Measurement - multiple wounds INTERVENTIONS - Wound Dressings X - Small Wound Dressing one or multiple wounds 1 10 []  - 0 Medium Wound Dressing one or multiple wounds X- 2 20 Large Wound Dressing one or multiple wounds []  - 0 Application of Medications - topical []  - 0 Application of Medications - injection INTERVENTIONS - Miscellaneous []  - External ear exam 0 []  - 0 Specimen Collection (cultures, biopsies, blood, body fluids, etc.) []  - 0 Specimen(s) / Culture(s) sent or taken to Lab for analysis []  - 0 Patient Transfer (multiple staff / Nurse, adult / Similar devices) []  - 0 Simple Staple / Suture removal (25 or less) []  - 0 Complex Staple / Suture removal (26 or more) []  - 0 Hypo / Hyperglycemic Management (close monitor of Blood Glucose) []  - 0 Ankle / Brachial Index (ABI) - do not check if billed separately X- 1 5 Vital Signs Clutter, Horris P. ([id number]) Has the patient been seen at the hospital within the last three years: Yes Total Score: 155 Level Of Care: New/Established - Level 4 Electronic Signature(s) Signed: 09/05/2017 3:20:05 PM By: Curtis Sites Entered By: Curtis Sites on 09/05/2017 14:59:09 Vital, [name] ([id number]) -------------------------------------------------------------------------------- Encounter Discharge Information Details Patient Name: Katayama, [name] Date of Service: 09/05/2017 2:00 PM Medical Record Number: [id number] Patient Account Number: [id number] Date of Birth/Sex: April 17, 1961 (57 y.o. Male) Treating RN: Curtis Sites Primary Care Lashonta Pilling: Hillery Aldo Other Clinician: Referring Flavio Lindroth: Hillery Aldo Treating Devlin Mcveigh/Extender: Linwood Dibbles, HOYT Weeks in  Treatment: 4 Encounter Discharge Information Items Discharge Pain Level: 0 Discharge Condition: Stable Ambulatory Status: Ambulatory Discharge Destination: Home Transportation: Private Auto Accompanied By: self Schedule Follow-up Appointment: Yes Medication Reconciliation completed and No provided to Patient/Care Mariadelcarmen Corella: Provided on Clinical Summary of Care: 09/05/2017 Form Type Recipient Paper Patient JC Electronic Signature(s) Signed: 09/05/2017 3:16:17 PM By: Alejandro Mulling Entered By: Alejandro Mulling on 09/05/2017 15:16:17 Hermida, [name] ([id number]) -------------------------------------------------------------------------------- Lower Extremity Assessment Details Patient Name: Minniefield, Dyland P. Date of Service: 09/05/2017 2:00 PM Medical Record Number: [id number] Patient Account Number: [id number] Date of Birth/Sex: January 25, 1961 (57 y.o. Male) Treating RN: Renne Crigler Primary Care Darolyn Double: Hillery Aldo Other Clinician: Referring Quincee Gittens: Hillery Aldo Treating Birda Didonato/Extender: Linwood Dibbles, HOYT Weeks in Treatment: 4 Edema Assessment Assessed: [Left: No] [Right: No] Edema: [Left: Yes] [Right: Yes] Calf Left: Right: Point of Measurement: 38 cm From Medial Instep 53.5 cm 48.5 cm Ankle Left: Right: Point of Measurement: 14 cm From Medial Instep 36.2 cm 30.2 cm Vascular Assessment Claudication: Claudication Assessment [Left:None] [Right:None] Pulses: Dorsalis Pedis Palpable: [Left:Yes] [Right:Yes] Posterior Tibial Extremity colors, hair growth, and conditions: Extremity Color: [Left:Hyperpigmented] [Right:Hyperpigmented] Hair Growth on Extremity: [Left:Yes] [Right:Yes] Temperature of Extremity: [Left:Warm] [Right:Warm] Capillary Refill: [Left:< 3 seconds] [Right:< 3 seconds] Toe Nail Assessment Left: Right: Thick: Yes Yes Discolored: Yes Yes Deformed: No No Improper Length and Hygiene: No No Electronic Signature(s) Signed: 09/05/2017 3:21:12 PM By: Renne Crigler Entered By: Renne Crigler on 09/05/2017 14:38:43 Nichols, [name] ([id number]) -------------------------------------------------------------------------------- Multi Wound Chart Details Patient Name: Renderos, Tajai P. Date of Service: 09/05/2017 2:00 PM Medical Record Number: [id number] Patient Account Number: [id number] Date of Birth/Sex: January 02, 1961 (57 y.o. Male) Treating RN: Curtis Sites Primary Care Carolyna Yerian: Hillery Aldo Other Clinician: Referring Celestine Bougie: Hillery Aldo Treating Anjulie Dipierro/Extender: Linwood Dibbles, HOYT Weeks in Treatment: 4 Vital Signs Height(in): 74 Pulse(bpm): 103 Weight(lbs): 310 Blood Pressure(mmHg): 110/74 Body Mass Index(BMI): 40 Temperature(F): 98.4 Respiratory  Rate 20 (breaths/min): Photos: [14:No Photos] [15:No Photos] [16:No Photos] Wound Location: [14:Left, Anterior Lower Leg] [15:Left, Posterior Lower Leg] [16:Right, Anterior Lower Leg] Wounding Event: [14:Gradually Appeared] [15:Gradually Appeared] [16:Gradually Appeared] Primary Etiology: [14:Venous Leg Ulcer] [15:Venous Leg Ulcer] [16:Venous Leg Ulcer] Comorbid History: [14:N/A] [15:N/A] [16:N/A] Date Acquired: [14:08/15/2017] [15:08/15/2017] [16:08/15/2017] Weeks of Treatment: [14:3] [15:3] [16:3] Wound Status: [14:Healed - Epithelialized] [15:Healed - Epithelialized] [16:Open] Measurements L x W x D [14:0x0x0] [15:0x0x0] [16:3.1x2.2x0.1] (cm) Area (cm) : [14:0] [15:0] [16:5.356] Volume (cm) : [14:0] [15:0] [16:0.536] % Reduction in Area: [14:100.00%] [15:100.00%] [16:9.10%] % Reduction in Volume: [14:100.00%] [15:100.00%] [16:9.00%] Classification: [14:Partial Thickness] [15:Partial Thickness] [16:Partial Thickness] Exudate Amount: [14:N/A] [15:N/A] [16:N/A] Wound Margin: [14:N/A] [15:N/A] [16:N/A] Granulation Amount: [14:N/A] [15:N/A] [16:N/A] Granulation Quality: [14:N/A] [15:N/A] [16:N/A] Necrotic Amount: [14:N/A] [15:N/A] [16:N/A] Necrotic Tissue: [14:N/A] [15:N/A]  [16:N/A] Epithelialization: [14:N/A] [15:N/A] [16:N/A] Periwound Skin Texture: [14:No Abnormalities Noted] [15:No Abnormalities Noted] [16:No Abnormalities Noted] Periwound Skin Moisture: [14:No Abnormalities Noted] [15:No Abnormalities Noted] [16:No Abnormalities Noted] Periwound Skin Color: [14:No Abnormalities Noted] [15:No Abnormalities Noted] [16:No Abnormalities Noted] Temperature: [14:N/A] [15:N/A] [16:N/A] Tenderness on Palpation: [14:No N/A] [15:No N/A] [16:No N/A] Wound Number: [id number] 18 N/A Photos: No Photos No Photos N/A Wound Location: Right Hand - 2nd Digit Left Knee N/A Wounding Event: Blister Trauma N/A Primary Etiology: Skin Tear Trauma, Other N/A Comorbid History: N/A Lymphedema, Sleep Apnea, N/A Arrhythmia, Deep Vein Weisinger, Jahni P. ([id number]) Thrombosis, Hypertension, Myocardial Infarction, Type II Diabetes, Neuropathy Date Acquired: 08/21/2017 08/29/2017 N/A Weeks of Treatment: 1 0 N/A Wound Status: Open Open N/A Measurements L x W x D 0.5x0.3x0.1 1.5x1x0.1 N/A (cm) Area (cm) : 0.118 1.178 N/A Volume (cm) : 0.012 0.118 N/A % Reduction in Area: 78.50% N/A N/A % Reduction in Volume: 78.20% N/A N/A Classification: Full Thickness Without Partial Thickness N/A Exposed Support Structures Exudate Amount: N/A None Present N/A Wound Margin: N/A Flat and Intact N/A Granulation Amount: N/A Large (67-100%) N/A Granulation Quality: N/A Red N/A Necrotic Amount: N/A Small (1-33%) N/A Necrotic Tissue: N/A Eschar N/A Epithelialization: N/A Large (67-100%) N/A Periwound Skin Texture: No Abnormalities Noted Excoriation: No N/A Induration: No Callus: No Crepitus: No Rash: No Scarring: No Periwound Skin Moisture: No Abnormalities Noted Maceration: No N/A Dry/Scaly: No Periwound Skin Color: No Abnormalities Noted Atrophie Blanche: No N/A Cyanosis: No Ecchymosis: No Erythema: No Hemosiderin Staining: No Mottled: No Pallor: No Rubor: No Temperature: N/A No Abnormality  N/A Tenderness on Palpation: No Yes N/A Wound Preparation: N/A Ulcer Cleansing: N/A Rinsed/Irrigated with Saline Topical Anesthetic Applied: None Treatment Notes Electronic Signature(s) Signed: 09/05/2017 3:20:05 PM By: Curtis Sites Entered By: Curtis Sites on 09/05/2017 14:45:25 Fleck, [name] ([id number]) -------------------------------------------------------------------------------- Multi-Disciplinary Care Plan Details Patient Name: [name]. Date of Service: 09/05/2017 2:00 PM Medical Record Number: [id number] Patient Account Number: [id number] Date of Birth/Sex: 03-11-1961 (57 y.o. Male) Treating RN: Curtis Sites Primary Care Ailyn Gladd: Hillery Aldo Other Clinician: Referring Enza Shone: Hillery Aldo Treating Adryel Wortmann/Extender: Linwood Dibbles, HOYT Weeks in Treatment: 4 Active Inactive Electronic Signature(s) Signed: 09/05/2017 3:20:05 PM By: Curtis Sites Entered By: Curtis Sites on 09/05/2017 14:45:05 Nicasio, [name] ([id number]) -------------------------------------------------------------------------------- Pain Assessment Details Patient Name: Ruppe, Akhilesh P. Date of Service: 09/05/2017 2:00 PM Medical Record Number: [id number] Patient Account Number: [id number] Date of Birth/Sex: [birth year] (57 y.o. Male) Treating RN: Renne Crigler Primary Care Gigi Onstad: Hillery Aldo Other Clinician: Referring Starsky Nanna: Hillery Aldo Treating Dorinne Graeff/Extender: Linwood Dibbles, HOYT Weeks in Treatment: 4 Active Problems Location of Pain Severity and Description of Pain Patient Has Paino No Site  Locations Pain Management and Medication Current Pain Management: Electronic Signature(s) Signed: 09/05/2017 3:21:12 PM By: Renne Crigler Entered By: Renne Crigler on 09/05/2017 14:25:43 Welby, [name] ([id number]) -------------------------------------------------------------------------------- Patient/Caregiver Education Details Patient Name: [name]. Date of Service: 09/05/2017  2:00 PM Medical Record Number: [id number] Patient Account Number: [id number] Date of Birth/Gender: [birth year] (57 y.o. Male) Treating RN: Phillis Haggis Primary Care Physician: Hillery Aldo Other Clinician: Referring Physician: Hillery Aldo Treating Physician/Extender: Skeet Simmer in Treatment: 4 Education Assessment Education Provided To: Patient Education Topics Provided Wound/Skin Impairment: Handouts: Caring for Your Ulcer, Other: change dressing as ordered Methods: Demonstration, Explain/Verbal Responses: State content correctly Electronic Signature(s) Signed: 09/05/2017 3:16:46 PM By: Alejandro Mulling Entered By: Alejandro Mulling on 09/05/2017 15:16:33 Laviolette, [name] ([id number]) -------------------------------------------------------------------------------- Wound Assessment Details Patient Name: Malinoski, Vishal P. Date of Service: 09/05/2017 2:00 PM Medical Record Number: [id number] Patient Account Number: [id number] Date of Birth/Sex: [birth year] (57 y.o. Male) Treating RN: Renne Crigler Primary Care Mamie Diiorio: Hillery Aldo Other Clinician: Referring Kylyn Mcdade: Hillery Aldo Treating Alyzabeth Pontillo/Extender: Linwood Dibbles, HOYT Weeks in Treatment: 4 Wound Status Wound Number: 14 Primary Etiology: Venous Leg Ulcer Wound Location: Left, Anterior Lower Leg Wound Status: Healed - Epithelialized Wounding Event: Gradually Appeared Date Acquired: 08/15/2017 Weeks Of Treatment: 3 Clustered Wound: No Photos Photo Uploaded By: Renne Crigler on 09/05/2017 14:56:59 Wound Measurements Length: (cm) 0 Width: (cm) 0 Depth: (cm) 0 Area: (cm) 0 Volume: (cm) 0 % Reduction in Area: 100% % Reduction in Volume: 100% Wound Description Classification: Partial Thickness Periwound Skin Texture Texture Color No Abnormalities Noted: No No Abnormalities Noted: No Moisture No Abnormalities Noted: No Electronic Signature(s) Signed: 09/05/2017 3:21:12 PM By: Renne Crigler Entered  By: Renne Crigler on 09/05/2017 14:33:01 Voris, [name] ([id number]) -------------------------------------------------------------------------------- Wound Assessment Details Patient Name: Furgason, Oron P. Date of Service: 09/05/2017 2:00 PM Medical Record Number: [id number] Patient Account Number: [id number] Date of Birth/Sex: 27-Jan-1961 (57 y.o. Male) Treating RN: Renne Crigler Primary Care Stefon Ramthun: Hillery Aldo Other Clinician: Referring Alejah Aristizabal: Hillery Aldo Treating Varian Innes/Extender: Linwood Dibbles, HOYT Weeks in Treatment: 4 Wound Status Wound Number: 15 Primary Etiology: Venous Leg Ulcer Wound Location: Left, Posterior Lower Leg Wound Status: Healed - Epithelialized Wounding Event: Gradually Appeared Date Acquired: 08/15/2017 Weeks Of Treatment: 3 Clustered Wound: No Photos Photo Uploaded By: Renne Crigler on 09/05/2017 14:58:18 Wound Measurements Length: (cm) 0 Width: (cm) 0 Depth: (cm) 0 Area: (cm) 0 Volume: (cm) 0 % Reduction in Area: 100% % Reduction in Volume: 100% Wound Description Classification: Partial Thickness Periwound Skin Texture Texture Color No Abnormalities Noted: No No Abnormalities Noted: No Moisture No Abnormalities Noted: No Electronic Signature(s) Signed: 09/05/2017 3:21:12 PM By: Renne Crigler Entered By: Renne Crigler on 09/05/2017 14:31:49 Rud, [name] ([id number]) -------------------------------------------------------------------------------- Wound Assessment Details Patient Name: Fedie, Fard P. Date of Service: 09/05/2017 2:00 PM Medical Record Number: [id number] Patient Account Number: [id number] Date of Birth/Sex: 09/24/1960 (57 y.o. Male) Treating RN: Renne Crigler Primary Care Opaline Reyburn: Hillery Aldo Other Clinician: Referring Onis Markoff: Hillery Aldo Treating Josepha Barbier/Extender: Linwood Dibbles, HOYT Weeks in Treatment: 4 Wound Status Wound Number: 16 Primary Etiology: Venous Leg Ulcer Wound Location: Right, Anterior  Lower Leg Wound Status: Open Wounding Event: Gradually Appeared Date Acquired: 08/15/2017 Weeks Of Treatment: 3 Clustered Wound: No Photos Photo Uploaded By: Renne Crigler on 09/05/2017 14:58:19 Wound Measurements Length: (cm) 3.1 Width: (cm) 2.2 Depth: (cm) 0.1 Area: (cm) 5.356 Volume: (cm) 0.536 % Reduction in Area: 9.1% % Reduction in Volume: 9% Wound Description Classification: Partial Thickness Periwound Skin Texture  Texture Color No Abnormalities Noted: No No Abnormalities Noted: No Moisture No Abnormalities Noted: No Treatment Notes Wound #16 (Right, Anterior Lower Leg) 1. Cleansed with: Clean wound with Normal Saline 2. Anesthetic Topical Lidocaine 4% cream to wound bed prior to debridement 4. Dressing Applied: WORTHINGTON, CRUZAN ([id number]) Hydrocolloid Other dressing (specify in notes) Electronic Signature(s) Signed: 09/05/2017 3:21:12 PM By: Renne Crigler Entered By: Renne Crigler on 09/05/2017 14:31:49 Barsamian, Brason P. ([id number]) -------------------------------------------------------------------------------- Wound Assessment Details Patient Name: Servais, Elchanan P. Date of Service: 09/05/2017 2:00 PM Medical Record Number: [id number] Patient Account Number: [id number] Date of Birth/Sex: [birth year] (57 y.o. Male) Treating RN: Renne Crigler Primary Care Marshawn Normoyle: Hillery Aldo Other Clinician: Referring Khayman Kirsch: Hillery Aldo Treating Leroy Pettway/Extender: Linwood Dibbles, HOYT Weeks in Treatment: 4 Wound Status Wound Number: 17 Primary Etiology: Skin Tear Wound Location: Right Hand - 2nd Digit Wound Status: Open Wounding Event: Blister Date Acquired: 08/21/2017 Weeks Of Treatment: 1 Clustered Wound: No Photos Photo Uploaded By: Renne Crigler on 09/05/2017 14:58:53 Wound Measurements Length: (cm) 0.5 Width: (cm) 0.3 Depth: (cm) 0.1 Area: (cm) 0.118 Volume: (cm) 0.012 % Reduction in Area: 78.5% % Reduction in Volume: 78.2% Wound  Description Full Thickness Without Exposed Support Classification: Structures Periwound Skin Texture Texture Color No Abnormalities Noted: No No Abnormalities Noted: No Moisture No Abnormalities Noted: No Treatment Notes Wound #17 (Right Hand - 2nd Digit) 1. Cleansed with: Clean wound with Normal Saline 2. Anesthetic Topical Lidocaine 4% cream to wound bed prior to debridement Yeakel, Kaysin P. ([id number]) 4. Dressing Applied: Other dressing (specify in notes) 5. Secondary Dressing Applied ABD Pad Kerlix/Conform 7. Secured with Tape Notes silvercel, kerlix, coban Electronic Signature(s) Signed: 09/05/2017 3:21:12 PM By: Renne Crigler Entered By: Renne Crigler on 09/05/2017 14:33:01 Goldwater, [name] ([id number]) -------------------------------------------------------------------------------- Wound Assessment Details Patient Name: Rabelo, Harkirat P. Date of Service: 09/05/2017 2:00 PM Medical Record Number: [id number] Patient Account Number: [id number] Date of Birth/Sex: December 01, 1960 (57 y.o. Male) Treating RN: Renne Crigler Primary Care Akina Maish: Hillery Aldo Other Clinician: Referring Emilyrose Darrah: Hillery Aldo Treating Desaree Downen/Extender: Linwood Dibbles, HOYT Weeks in Treatment: 4 Wound Status Wound Number: 18 Primary Trauma, Other Etiology: Wound Location: Left Knee Wound Open Wounding Event: Trauma Status: Date Acquired: 08/29/2017 Comorbid Lymphedema, Sleep Apnea, Arrhythmia, Deep Weeks Of Treatment: 0 History: Vein Thrombosis, Hypertension, Myocardial Clustered Wound: No Infarction, Type II Diabetes, Neuropathy Photos Photo Uploaded By: Renne Crigler on 09/05/2017 14:58:53 Wound Measurements Length: (cm) 1.5 Width: (cm) 1 Depth: (cm) 0.1 Area: (cm) 1.178 Volume: (cm) 0.118 % Reduction in Area: % Reduction in Volume: Epithelialization: Large (67-100%) Tunneling: No Undermining: No Wound Description Classification: Partial Thickness Wound Margin: Flat and  Intact Exudate Amount: None Present Foul Odor After Cleansing: No Slough/Fibrino Yes Wound Bed Granulation Amount: Large (67-100%) Exposed Structure Granulation Quality: Red Fascia Exposed: No Necrotic Amount: Small (1-33%) Fat Layer (Subcutaneous Tissue) Exposed: No Necrotic Quality: Eschar Tendon Exposed: No Muscle Exposed: No Joint Exposed: No Bone Exposed: No Periwound Skin Texture Texture Color No Abnormalities Noted: No No Abnormalities Noted: No Overturf, Osa P. ([id number]) Callus: No Atrophie Blanche: No Crepitus: No Cyanosis: No Excoriation: No Ecchymosis: No Induration: No Erythema: No Rash: No Hemosiderin Staining: No Scarring: No Mottled: No Pallor: No Moisture Rubor: No No Abnormalities Noted: No Dry / Scaly: No Temperature / Pain Maceration: No Temperature: No Abnormality Tenderness on Palpation: Yes Wound Preparation Ulcer Cleansing: Rinsed/Irrigated with Saline Topical Anesthetic Applied: None Treatment Notes Wound #18 (Left Knee) 1. Cleansed with: Clean wound with Normal Saline 2. Anesthetic Topical Lidocaine  4% cream to wound bed prior to debridement 4. Dressing Applied: Other dressing (specify in notes) 5. Secondary Dressing Applied ABD Pad Kerlix/Conform 7. Secured with Tape Notes silvercel, kerlix, coban Electronic Signature(s) Signed: 09/05/2017 3:21:12 PM By: Renne CriglerFlinchum, Cheryl Entered By: Renne CriglerFlinchum, Cheryl on 09/05/2017 14:35:48 Hedden, Laren EvertsJIM P. ([id number]) -------------------------------------------------------------------------------- Vitals Details Patient Name: Walberg, Ruhan P. Date of Service: 09/05/2017 2:00 PM Medical Record Number: [id number] Patient Account Number: [id number] Date of Birth/Sex: Apr 23, 1961 [age] y.o. Male) Treating RN: Renne CriglerFlinchum, Cheryl Primary Care Judy Pollman: Hillery AldoPATEL, SARAH Other Clinician: Referring Tanda Morrissey: Hillery AldoPATEL, SARAH Treating Malva Diesing/Extender: Linwood DibblesSTONE III, HOYT Weeks in Treatment: 4 Vital Signs Time Taken:  14:20 Temperature (F): 98.4 Height (in): 74 Pulse (bpm): 103 Weight (lbs): 310 Respiratory Rate (breaths/min): 20 Body Mass Index (BMI): 39.8 Blood Pressure (mmHg): 110/74 Reference Range: 80 - 120 mg / dl Electronic Signature(s) Signed: 09/05/2017 3:21:12 PM By: Renne CriglerFlinchum, Cheryl Entered By: Renne CriglerFlinchum, Cheryl on 09/05/2017 14:21:02

## 2017-09-09 LAB — ACID FAST CULTURE WITH REFLEXED SENSITIVITIES: ACID FAST CULTURE - AFSCU3: NEGATIVE

## 2017-09-09 NOTE — Progress Notes (Signed)
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 Kelp PA-C Entered By: Lenda Kelp on 09/05/2017 16:21:27 [name], [name] ([id number]) -------------------------------------------------------------------------------- ROS/PFSH Details Patient Name: [name]. Date of Service: 09/05/2017 2:00 PM Medical Record Number: [id number] Patient Account Number: [id number] Date of Birth/Sex: 30-Apr-1961 (57 y.o. Male) Treating RN: Curtis Sites Primary Care Provider: Hillery Aldo Other Clinician: Referring Provider: Hillery Aldo Treating Provider/Extender: Linwood Dibbles, Khalid Lacko Weeks in Treatment: 4 Information Obtained From Patient Wound History Do you currently have one or more open woundso Yes How many open wounds do you currently haveo 5 Approximately how long have you had your woundso several months How have you been treating your wound(s) until nowo doxycycline for cellulitis Has your wound(s) ever healed and then re-openedo No Have you had any lab work done in the past montho No Have you tested positive for an antibiotic resistant organism (MRSA, VRE)o Yes Have you tested positive for osteomyelitis (bone infection)o No Have you had any tests for circulation on your legso Yes Who ordered the testo cone 5 years ago Constitutional Symptoms (General Health) Complaints and Symptoms: Negative for: Fever; Chills Eyes Medical History: Negative for: Cataracts; Glaucoma; Optic Neuritis Ear/Nose/Mouth/Throat Medical History: Negative for: Chronic sinus problems/congestion; Middle ear  problems Hematologic/Lymphatic Medical History: Positive for: Lymphedema Negative for: Anemia; Hemophilia; Human Immunodeficiency Virus; Sickle Cell Disease Respiratory Complaints and Symptoms: No Complaints or Symptoms Medical History: Positive for: Sleep Apnea - cpap and ventalator for trach Negative for: Aspiration; Asthma; Chronic Obstructive Pulmonary Disease (COPD); Pneumothorax; Tuberculosis Cardiovascular Complaints and Symptoms: No Complaints or Symptoms Medical History: Hone, Aidan P. ([id number]) Positive for: Arrhythmia - afib; Deep Vein Thrombosis; Hypertension; Myocardial Infarction Negative for: Angina; Congestive Heart Failure; Coronary Artery Disease; Hypotension; Peripheral Arterial Disease; Peripheral Venous Disease; Phlebitis Gastrointestinal Medical History: Negative for: Cirrhosis ; Colitis; Crohnos; Hepatitis A; Hepatitis B; Hepatitis C Endocrine Medical History: Positive for: Type II Diabetes Time with diabetes: 6 years Treated with: Insulin Blood sugar tested every day: No Blood sugar testing results: Bedtime: 132 Genitourinary Medical History: Negative for: End Stage Renal Disease Immunological Medical History: Negative for: Lupus Erythematosus; Raynaudos; Scleroderma Musculoskeletal Medical History: Negative for: Gout; Rheumatoid Arthritis; Osteoarthritis; Osteomyelitis Neurologic Medical History: Positive for: Neuropathy Negative for: Quadriplegia; Paraplegia; Seizure Disorder Oncologic Medical History: Negative for: Received Chemotherapy; Received Radiation Psychiatric Complaints and Symptoms: No Complaints or Symptoms Medical History: Negative for: Anorexia/bulimia; Confinement Anxiety Immunizations Pneumococcal Vaccine: Received Pneumococcal Vaccination: Yes Implantable Devices [place], Jorgen P. ([id number]) Family and Social History Cancer: Yes - Siblings; Diabetes: No; Heart Disease: Yes - Siblings,Father,Mother,Paternal  Grandparents,Maternal Grandparents; Hereditary Spherocytosis: No; Hypertension: Yes - Child,Siblings,Father,Paternal Grandparents,Maternal Grandparents; Kidney Disease: No; Lung Disease: No; Seizures: No; Stroke: No; Thyroid Problems: No; Tuberculosis: Yes - Maternal Grandparents; Never smoker; Marital Status - Divorced; Alcohol Use: Never; Drug Use: No History; Caffeine Use: Daily - coffee; Financial Concerns: No; Food, Clothing or Shelter Needs: No; Support System Lacking: No; Transportation Concerns: No; Advanced Directives: No; Patient does not want information on Advanced Directives; Living Will: No Physician Affirmation I have reviewed and agree with the above information. Electronic Signature(s)  Signed: 09/05/2017 6:13:52 PM By: Lenda Kelp PA-C Signed: 09/08/2017 4:59:43 PM By: Curtis Sites Entered By: Lenda Kelp on 09/05/2017 16:19:48 Martello, [name] ([id number]) -------------------------------------------------------------------------------- SuperBill Details Patient Name: Geck, Javius P. Date of Service: 09/05/2017 Medical Record Number: [id number] Patient Account Number: [id number] Date of Birth/Sex: 11/24/1960 (56 y.o. 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 systolic (congestive) and diastolic  (congestive) heart failure I48.2 Chronic atrial fibrillation Facility Procedures CPT4 Code: 08657846 Description: 99214 - WOUND CARE VISIT-LEV 4 EST PT Modifier: Quantity: 1 Physician Procedures CPT4 Code Description: [id number] - WC PHYS LEVEL 3 - EST PT ICD-10 Diagnosis Description I89.0 Lymphedema, not elsewhere classified E11.622 Type 2 diabetes mellitus with other skin ulcer L97.821 Non-pressure chronic ulcer of other part of left lower  leg lim S61.200A Unspecified open wound of right index finger without damage to Modifier: ited to breakdown nail, initial en Quantity: 1 of skin counter Electronic Signature(s) Signed: 09/05/2017 6:13:52 PM By: Lenda Kelp PA-C Entered By: Lenda Kelp on 09/05/2017 16:21:47

## 2017-09-12 ENCOUNTER — Encounter: Payer: Medicare HMO | Admitting: Physician Assistant

## 2017-09-12 DIAGNOSIS — E11621 Type 2 diabetes mellitus with foot ulcer: Secondary | ICD-10-CM | POA: Diagnosis not present

## 2017-09-16 ENCOUNTER — Encounter: Payer: Medicare HMO | Admitting: Physician Assistant

## 2017-09-16 DIAGNOSIS — E11621 Type 2 diabetes mellitus with foot ulcer: Secondary | ICD-10-CM | POA: Diagnosis not present

## 2017-09-16 NOTE — Progress Notes (Signed)
ABEER, IVERSEN ([id number]) Visit Report for 09/12/2017 Arrival Information Details Patient Name: [name], [name]. Date of Service: 09/12/2017 8:00 AM Medical Record Number: [id number] Patient Account Number: [id number] Date of Birth/Sex: August 23, 1960 (57 y.o. M) Treating RN: Curtis Sites Primary Care Akiko Schexnider: Hillery Aldo Other Clinician: Referring Saveon Plant: Hillery Aldo Treating Lindsey Demonte/Extender: Linwood Dibbles, HOYT Weeks in Treatment: 5 Visit Information History Since Last Visit Added or deleted any medications: No Patient Arrived: Ambulatory Any new allergies or adverse reactions: No Arrival Time: 08:10 Had a fall or experienced change in No Accompanied By: self activities of daily living that may affect Transfer Assistance: None risk of falls: Patient Identification Verified: Yes Signs or symptoms of abuse/neglect since last visito No Secondary Verification Process Yes Hospitalized since last visit: No Completed: Has Dressing in Place as Prescribed: No Patient Requires Transmission-Based No Has Compression in Place as Prescribed: No Precautions: Pain Present Now: No Patient Has Alerts: Yes Patient Alerts: noncompressible bilateral Electronic Signature(s) Signed: 09/12/2017 4:19:12 PM By: Curtis Sites Entered By: Curtis Sites on 09/12/2017 08:10:59 Meda, [name] ([id number]) -------------------------------------------------------------------------------- Clinic Level of Care Assessment Details Patient Name: [name]. Date of Service: 09/12/2017 8:00 AM Medical Record Number: [id number] Patient Account Number: [id number] Date of Birth/Sex: 1961/06/07 (57 y.o. M) Treating RN: Curtis Sites Primary Care Santo Zahradnik: Hillery Aldo Other Clinician: Referring Zacary Bauer: Hillery Aldo Treating Chenita Ruda/Extender: Linwood Dibbles, HOYT Weeks in Treatment: 5 Clinic Level of Care Assessment Items TOOL 4 Quantity Score []  - Use when only an EandM is performed on FOLLOW-UP visit  0 ASSESSMENTS - Nursing Assessment / Reassessment X - Reassessment of Co-morbidities (includes updates in patient status) 1 10 X- 1 5 Reassessment of Adherence to Treatment Plan ASSESSMENTS - Wound and Skin Assessment / Reassessment []  - Simple Wound Assessment / Reassessment - one wound 0 X- 4 5 Complex Wound Assessment / Reassessment - multiple wounds []  - 0 Dermatologic / Skin Assessment (not related to wound area) ASSESSMENTS - Focused Assessment X - Circumferential Edema Measurements - multi extremities 2 5 []  - 0 Nutritional Assessment / Counseling / Intervention X- 1 5 Lower Extremity Assessment (monofilament, tuning fork, pulses) []  - 0 Peripheral Arterial Disease Assessment (using hand held doppler) ASSESSMENTS - Ostomy and/or Continence Assessment and Care []  - Incontinence Assessment and Management 0 []  - 0 Ostomy Care Assessment and Management (repouching, etc.) PROCESS - Coordination of Care X - Simple Patient / Family Education for ongoing care 1 15 []  - 0 Complex (extensive) Patient / Family Education for ongoing care []  - 0 Staff obtains Chiropractor, Records, Test Results / Process Orders []  - 0 Staff telephones HHA, Nursing Homes / Clarify orders / etc []  - 0 Routine Transfer to another Facility (non-emergent condition) []  - 0 Routine Hospital Admission (non-emergent condition) []  - 0 New Admissions / Manufacturing engineer / Ordering NPWT, Apligraf, etc. []  - 0 Emergency Hospital Admission (emergent condition) X- 1 10 Simple Discharge Coordination Emmer, Beverly [name]. ([id number]) []  - 0 Complex (extensive) Discharge Coordination PROCESS - Special Needs []  - Pediatric / Minor Patient Management 0 []  - 0 Isolation Patient Management []  - 0 Hearing / Language / Visual special needs []  - 0 Assessment of Community assistance (transportation, D/C planning, etc.) []  - 0 Additional assistance / Altered mentation []  - 0 Support Surface(s) Assessment (bed,  cushion, seat, etc.) INTERVENTIONS - Wound Cleansing / Measurement []  - Simple Wound Cleansing - one wound 0 X- 4 5 Complex Wound Cleansing - multiple wounds X- 1 5 Wound Imaging (  photographs - any number of wounds) []  - 0 Wound Tracing (instead of photographs) []  - 0 Simple Wound Measurement - one wound X- 4 5 Complex Wound Measurement - multiple wounds INTERVENTIONS - Wound Dressings []  - Small Wound Dressing one or multiple wounds 0 X- 4 15 Medium Wound Dressing one or multiple wounds []  - 0 Large Wound Dressing one or multiple wounds []  - 0 Application of Medications - topical []  - 0 Application of Medications - injection INTERVENTIONS - Miscellaneous []  - External ear exam 0 []  - 0 Specimen Collection (cultures, biopsies, blood, body fluids, etc.) []  - 0 Specimen(s) / Culture(s) sent or taken to Lab for analysis []  - 0 Patient Transfer (multiple staff / Nurse, adultHoyer Lift / Similar devices) []  - 0 Simple Staple / Suture removal (25 or less) []  - 0 Complex Staple / Suture removal (26 or more) []  - 0 Hypo / Hyperglycemic Management (close monitor of Blood Glucose) []  - 0 Ankle / Brachial Index (ABI) - do not check if billed separately X- 1 5 Vital Signs Rehfeldt, Naphtali [name]. ([id number]) Has the patient been seen at the hospital within the last three years: Yes Total Score: 185 Level Of Care: New/Established - Level 5 Electronic Signature(s) Signed: 09/12/2017 4:19:12 PM By: Curtis Sitesorthy, Joanna Entered By: Curtis Sitesorthy, Joanna on 09/12/2017 09:07:15 Vey, Laren EvertsJIM [name]. ([id number]) -------------------------------------------------------------------------------- Encounter Discharge Information Details Patient Name: [name], [name]. Date of Service: 09/12/2017 8:00 AM Medical Record Number: [id number] Patient Account Number: [id number] Date of Birth/Sex: 04-27-1961 (57 y.o. M) Treating RN: Phillis HaggisPinkerton, Debi Primary Care Alley Neils: Hillery AldoPATEL, SARAH Other Clinician: Referring Temia Debroux: Hillery AldoPATEL,  SARAH Treating Jaiya Mooradian/Extender: Linwood DibblesSTONE III, HOYT Weeks in Treatment: 5 Encounter Discharge Information Items Discharge Pain Level: 0 Discharge Condition: Stable Ambulatory Status: Ambulatory Discharge Destination: Home Transportation: Private Auto Accompanied By: self Schedule Follow-up Appointment: Yes Medication Reconciliation completed and No provided to Patient/Care Twyla Dais: Provided on Clinical Summary of Care: 09/12/2017 Form Type Recipient Paper Patient JC Electronic Signature(s) Signed: 09/15/2017 10:49:50 AM By: Gwenlyn PerkingMoore, Shelia Entered By: Gwenlyn PerkingMoore, Shelia on 09/12/2017 09:19:47 [place], Laren EvertsJIM [name]. ([id number]) -------------------------------------------------------------------------------- Lower Extremity Assessment Details Patient Name: Hayworth, Farouk [name]. Date of Service: 09/12/2017 8:00 AM Medical Record Number: [id number] Patient Account Number: [id number] Date of Birth/Sex: 04-27-1961 (57 y.o. M) Treating RN: Curtis Sitesorthy, Joanna Primary Care Linnet Bottari: Hillery AldoPATEL, SARAH Other Clinician: Referring Norman Piacentini: Hillery AldoPATEL, SARAH Treating Kamil Mchaffie/Extender: Linwood DibblesSTONE III, HOYT Weeks in Treatment: 5 Edema Assessment Assessed: [Left: No] [Right: No] [Left: Edema] [Right: :] Calf Left: Right: Point of Measurement: 38 cm From Medial Instep 50.2 cm 48 cm Ankle Left: Right: Point of Measurement: 14 cm From Medial Instep 35.2 cm 31.6 cm Electronic Signature(s) Signed: 09/12/2017 4:19:12 PM By: Curtis Sitesorthy, Joanna Entered By: Curtis Sitesorthy, Joanna on 09/12/2017 08:54:48 Ang, Demarrio [name]. ([id number]) -------------------------------------------------------------------------------- Multi Wound Chart Details Patient Name: Audia, Brain [name]. Date of Service: 09/12/2017 8:00 AM Medical Record Number: [id number] Patient Account Number: [id number] Date of Birth/Sex: 04-27-1961 (57 y.o. M) Treating RN: Curtis Sitesorthy, Joanna Primary Care Clemmie Marxen: Hillery AldoPATEL, SARAH Other Clinician: Referring Kedra Mcglade: Hillery AldoPATEL, SARAH Treating Dontea Corlew/Extender:  Linwood DibblesSTONE III, HOYT Weeks in Treatment: 5 Vital Signs Height(in): 74 Pulse(bpm): 94 Weight(lbs): 310 Blood Pressure(mmHg): 118/69 Body Mass Index(BMI): 40 Temperature(F): 98.1 Respiratory Rate 20 (breaths/min): Photos: [16:No Photos] [17:No Photos] [18:No Photos] Wound Location: [16:Right Lower Leg - Anterior] [17:Right Hand - 2nd Digit] [18:Left Knee] Wounding Event: [16:Gradually Appeared] [17:Blister] [18:Trauma] Primary Etiology: [16:Venous Leg Ulcer] [17:Skin Tear] [18:Trauma, Other] Comorbid History: [16:Lymphedema, Sleep Apnea, Arrhythmia, Deep Vein Thrombosis, Hypertension, Myocardial Infarction, Type II Diabetes, Neuropathy] [  17:N/A] [18:Lymphedema, Sleep Apnea, Arrhythmia, Deep Vein Thrombosis, Hypertension, Myocardial  Infarction, Type II Diabetes, Neuropathy] Date Acquired: [16:08/15/2017] [17:08/21/2017] [18:08/29/2017] Weeks of Treatment: [16:4] [17:2] [18:1] Wound Status: [16:Open] [17:Healed - Epithelialized] [18:Open] Clustered Wound: [16:No] [17:No] [18:No] Clustered Quantity: [16:N/A] [17:N/A] [18:N/A] Measurements L x W x D [16:1.4x1.1x0.1] [17:0x0x0] [18:1.6x1.6x0.1] (cm) Area (cm) : [16:1.21] [17:0] [18:2.011] Volume (cm) : [16:0.121] [17:0] [18:0.201] % Reduction in Area: [16:79.50%] [17:100.00%] [18:-70.70%] % Reduction in Volume: [16:79.50%] [17:100.00%] [18:-70.30%] Classification: [16:Partial Thickness] [17:Full Thickness Without Exposed Support Structures] [18:Partial Thickness] Exudate Amount: [16:Medium] [17:N/A] [18:Small] Exudate Type: [16:Serosanguineous] [17:N/A] [18:Serosanguineous] Exudate Color: [16:red, brown] [17:N/A] [18:red, brown] Wound Margin: [16:Distinct, outline attached] [17:N/A] [18:Flat and Intact] Granulation Amount: [16:None Present (0%)] [17:N/A] [18:None Present (0%)] Granulation Quality: [16:N/A] [17:N/A] [18:N/A] Necrotic Amount: [16:Large (67-100%)] [17:N/A] [18:Large (67-100%)] Necrotic Tissue: [16:Eschar, Adherent Slough]  [17:N/A] [18:Eschar] Exposed Structures: [16:Fascia: No Fat Layer (Subcutaneous Tissue) Exposed: No Tendon: No Muscle: No Joint: No Bone: No] [17:N/A] [18:Fascia: No Fat Layer (Subcutaneous Tissue) Exposed: No Tendon: No Muscle: No Joint: No Bone: No] Epithelialization: None N/A Large (67-100%) Periwound Skin Texture: No Abnormalities Noted No Abnormalities Noted Excoriation: No Induration: No Callus: No Crepitus: No Rash: No Scarring: No Periwound Skin Moisture: No Abnormalities Noted No Abnormalities Noted Maceration: No Dry/Scaly: No Periwound Skin Color: No Abnormalities Noted No Abnormalities Noted Atrophie Blanche: No Cyanosis: No Ecchymosis: No Erythema: No Hemosiderin Staining: No Mottled: No Pallor: No Rubor: No Temperature: No Abnormality N/A No Abnormality Tenderness on Palpation: Yes No Yes Wound Preparation: Ulcer Cleansing: N/A Ulcer Cleansing: Rinsed/Irrigated with Saline Rinsed/Irrigated with Saline Topical Anesthetic Applied: Topical Anesthetic Applied: Other: lidocaine 4% Other: lidocaine 4% Wound Number: [id number] 20 N/A Photos: No Photos No Photos N/A Wound Location: Right Lower Leg - Medial Left Lower Leg - Anterior N/A Wounding Event: Trauma Trauma N/A Primary Etiology: Trauma, Other Trauma, Other N/A Comorbid History: Lymphedema, Sleep Apnea, Lymphedema, Sleep Apnea, N/A Arrhythmia, Deep Vein Arrhythmia, Deep Vein Thrombosis, Hypertension, Thrombosis, Hypertension, Myocardial Infarction, Type II Myocardial Infarction, Type II Diabetes, Neuropathy Diabetes, Neuropathy Date Acquired: 09/12/2017 09/12/2017 N/A Weeks of Treatment: 0 0 N/A Wound Status: Open Open N/A Clustered Wound: Yes No N/A Clustered Quantity: 9 N/A N/A Measurements L x W x D 7x9x0.1 1.2x0.3x0.1 N/A (cm) Area (cm) : 49.48 0.283 N/A Volume (cm) : 4.948 0.028 N/A % Reduction in Area: 0.00% N/A N/A % Reduction in Volume: 0.00% N/A N/A Classification: Full Thickness Without Full  Thickness Without N/A Exposed Support Structures Exposed Support Structures Exudate Amount: Large Large N/A Exudate Type: Serosanguineous Serosanguineous N/A Exudate Color: red, brown red, brown N/A Wound Margin: Distinct, outline attached Distinct, outline attached N/A Granulation Amount: Large (67-100%) Large (67-100%) N/A Granulation Quality: N/A Red N/A Necrotic Amount: Small (1-33%) None Present (0%) N/A Necrotic Tissue: Eschar N/A N/A Exposed Structures: N/A Kasparian, Chase [name]. ([id number]) Fascia: No Fascia: No Fat Layer (Subcutaneous Fat Layer (Subcutaneous Tissue) Exposed: No Tissue) Exposed: No Tendon: No Tendon: No Muscle: No Muscle: No Joint: No Joint: No Bone: No Bone: No Epithelialization: None None N/A Periwound Skin Texture: No Abnormalities Noted No Abnormalities Noted N/A Periwound Skin Moisture: No Abnormalities Noted No Abnormalities Noted N/A Periwound Skin Color: No Abnormalities Noted No Abnormalities Noted N/A Temperature: No Abnormality No Abnormality N/A Tenderness on Palpation: Yes Yes N/A Wound Preparation: Ulcer Cleansing: Ulcer Cleansing: N/A Rinsed/Irrigated with Saline Rinsed/Irrigated with Saline Topical Anesthetic Applied: Topical Anesthetic Applied: Other: lidocaine 4% None Treatment Notes Electronic Signature(s) Signed: 09/12/2017 4:19:12 PM By: Curtis Sites Entered By:  Curtis Sites on 09/12/2017 08:55:56 Mctighe, [name] ([id number]) -------------------------------------------------------------------------------- Multi-Disciplinary Care Plan Details Patient Name: Welge, DANYEL GRIESS. Date of Service: 09/12/2017 8:00 AM Medical Record Number: [id number] Patient Account Number: [id number] Date of Birth/Sex: 07-02-1960 (57 y.o. M) Treating RN: Curtis Sites Primary Care Jaeveon Ashland: Hillery Aldo Other Clinician: Referring Braedon Sjogren: Hillery Aldo Treating Dimond Crotty/Extender: Linwood Dibbles, HOYT Weeks in Treatment: 5 Active Inactive Electronic  Signature(s) Signed: 09/12/2017 4:19:12 PM By: Curtis Sites Entered By: Curtis Sites on 09/12/2017 08:55:05 Chappuis, [name] ([id number]) -------------------------------------------------------------------------------- Pain Assessment Details Patient Name: Lasseter, Brasen [name]. Date of Service: 09/12/2017 8:00 AM Medical Record Number: [id number] Patient Account Number: [id number] Date of Birth/Sex: 05/29/1961 (57 y.o. M) Treating RN: Curtis Sites Primary Care Yeraldine Forney: Hillery Aldo Other Clinician: Referring Hue Frick: Hillery Aldo Treating Breea Loncar/Extender: Linwood Dibbles, HOYT Weeks in Treatment: 5 Active Problems Location of Pain Severity and Description of Pain Patient Has Paino No Site Locations Pain Management and Medication Current Pain Management: Electronic Signature(s) Signed: 09/12/2017 4:19:12 PM By: Curtis Sites Entered By: Curtis Sites on 09/12/2017 08:11:06 Steffensen, [name] ([id number]) -------------------------------------------------------------------------------- Patient/Caregiver Education Details Patient Name: [name]. Date of Service: 09/12/2017 8:00 AM Medical Record Number: [id number] Patient Account Number: [id number] Date of Birth/Gender: [birth year] (57 y.o. M) Treating RN: Phillis Haggis Primary Care Physician: Hillery Aldo Other Clinician: Referring Physician: Hillery Aldo Treating Physician/Extender: Skeet Simmer in Treatment: 5 Education Assessment Education Provided To: Patient Education Topics Provided Wound/Skin Impairment: Handouts: Caring for Your Ulcer, Other: do not get wraps wet Methods: Demonstration, Explain/Verbal Responses: State content correctly Electronic Signature(s) Signed: 09/15/2017 4:28:49 PM By: Alejandro Mulling Entered By: Alejandro Mulling on 09/12/2017 09:19:50 Mabin, [name] ([id number]) -------------------------------------------------------------------------------- Wound Assessment Details Patient Name: Kuhner,  Quentez [name]. Date of Service: 09/12/2017 8:00 AM Medical Record Number: [id number] Patient Account Number: [id number] Date of Birth/Sex: November 26, 1960 (57 y.o. M) Treating RN: Curtis Sites Primary Care Diar Berkel: Hillery Aldo Other Clinician: Referring Syon Tews: Hillery Aldo Treating Lew Prout/Extender: Linwood Dibbles, HOYT Weeks in Treatment: 5 Wound Status Wound Number: 16 Primary Venous Leg Ulcer Etiology: Wound Location: Right Lower Leg - Anterior Wound Open Wounding Event: Gradually Appeared Status: Date Acquired: 08/15/2017 Comorbid Lymphedema, Sleep Apnea, Arrhythmia, Deep Weeks Of Treatment: 4 History: Vein Thrombosis, Hypertension, Myocardial Clustered Wound: No Infarction, Type II Diabetes, Neuropathy Wound Measurements Length: (cm) 1.4 Width: (cm) 1.1 Depth: (cm) 0.1 Area: (cm) 1.21 Volume: (cm) 0.121 % Reduction in Area: 79.5% % Reduction in Volume: 79.5% Epithelialization: None Tunneling: No Undermining: No Wound Description Classification: Partial Thickness Wound Margin: Distinct, outline attached Exudate Amount: Medium Exudate Type: Serosanguineous Exudate Color: red, brown Foul Odor After Cleansing: No Slough/Fibrino Yes Wound Bed Granulation Amount: None Present (0%) Exposed Structure Necrotic Amount: Large (67-100%) Fascia Exposed: No Necrotic Quality: Eschar, Adherent Slough Fat Layer (Subcutaneous Tissue) Exposed: No Tendon Exposed: No Muscle Exposed: No Joint Exposed: No Bone Exposed: No Periwound Skin Texture Texture Color No Abnormalities Noted: No No Abnormalities Noted: No Moisture Temperature / Pain No Abnormalities Noted: No Temperature: No Abnormality Tenderness on Palpation: Yes Wound Preparation Ulcer Cleansing: Rinsed/Irrigated with Saline Topical Anesthetic Applied: Other: lidocaine 4%, Treatment Notes Wound #16 (Right, Anterior Lower Leg) Nuckles, Bird [name]. ([id number]) 1. Cleansed with: Clean wound with Normal Saline 2.  Anesthetic Topical Lidocaine 4% cream to wound bed prior to debridement 4. Dressing Applied: Other dressing (specify in notes) 5. Secondary Dressing Applied ABD Pad Kerlix/Conform Notes kerlix, coban, unna to anchor Electronic Signature(s) Signed: 09/12/2017 4:19:12 PM By: Curtis Sites Entered By: Curtis Sites on  09/12/2017 08:16:23 Hecht, Yang [name]. ([id number]) -------------------------------------------------------------------------------- Wound Assessment Details Patient Name: Eimer, Jeffre [name]. Date of Service: 09/12/2017 8:00 AM Medical Record Number: [id number] Patient Account Number: [id number] Date of Birth/Sex: 1961/06/20 (57 y.o. M) Treating RN: Curtis Sites Primary Care Ifeoluwa Beller: Hillery Aldo Other Clinician: Referring Irini Leet: Hillery Aldo Treating Jarrin Staley/Extender: Linwood Dibbles, HOYT Weeks in Treatment: 5 Wound Status Wound Number: 17 Primary Etiology: Skin Tear Wound Location: Right Hand - 2nd Digit Wound Status: Healed - Epithelialized Wounding Event: Blister Date Acquired: 08/21/2017 Weeks Of Treatment: 2 Clustered Wound: No Wound Measurements Length: (cm) 0 Width: (cm) 0 Depth: (cm) 0 Area: (cm) 0 Volume: (cm) 0 % Reduction in Area: 100% % Reduction in Volume: 100% Wound Description Full Thickness Without Exposed Support Classification: Structures Periwound Skin Texture Texture Color No Abnormalities Noted: No No Abnormalities Noted: No Moisture No Abnormalities Noted: No Electronic Signature(s) Signed: 09/12/2017 4:19:12 PM By: Curtis Sites Entered By: Curtis Sites on 09/12/2017 08:14:49 Grumbine, [name] ([id number]) -------------------------------------------------------------------------------- Wound Assessment Details Patient Name: Pursifull, Amelia [name]. Date of Service: 09/12/2017 8:00 AM Medical Record Number: [id number] Patient Account Number: [id number] Date of Birth/Sex: 1960/11/08 (57 y.o. M) Treating RN: Curtis Sites Primary Care Jillianne Gamino:  Hillery Aldo Other Clinician: Referring Gabriana Wilmott: Hillery Aldo Treating Genie Mirabal/Extender: Linwood Dibbles, HOYT Weeks in Treatment: 5 Wound Status Wound Number: 18 Primary Trauma, Other Etiology: Wound Location: Left Knee Wound Open Wounding Event: Trauma Status: Date Acquired: 08/29/2017 Comorbid Lymphedema, Sleep Apnea, Arrhythmia, Deep Weeks Of Treatment: 1 History: Vein Thrombosis, Hypertension, Myocardial Clustered Wound: No Infarction, Type II Diabetes, Neuropathy Wound Measurements Length: (cm) 1.6 Width: (cm) 1.6 Depth: (cm) 0.1 Area: (cm) 2.011 Volume: (cm) 0.201 % Reduction in Area: -70.7% % Reduction in Volume: -70.3% Epithelialization: Large (67-100%) Tunneling: No Undermining: No Wound Description Classification: Partial Thickness Foul O Wound Margin: Flat and Intact Slough Exudate Amount: Small Exudate Type: Serosanguineous Exudate Color: red, brown dor After Cleansing: No /Fibrino Yes Wound Bed Granulation Amount: None Present (0%) Exposed Structure Necrotic Amount: Large (67-100%) Fascia Exposed: No Necrotic Quality: Eschar Fat Layer (Subcutaneous Tissue) Exposed: No Tendon Exposed: No Muscle Exposed: No Joint Exposed: No Bone Exposed: No Periwound Skin Texture Texture Color No Abnormalities Noted: No No Abnormalities Noted: No Callus: No Atrophie Blanche: No Crepitus: No Cyanosis: No Excoriation: No Ecchymosis: No Induration: No Erythema: No Rash: No Hemosiderin Staining: No Scarring: No Mottled: No Pallor: No Moisture Rubor: No No Abnormalities Noted: No Dry / Scaly: No Temperature / Pain Maceration: No Temperature: No Abnormality Ogawa, Nakhi [name]. ([id number]) Tenderness on Palpation: Yes Wound Preparation Ulcer Cleansing: Rinsed/Irrigated with Saline Topical Anesthetic Applied: Other: lidocaine 4%, Treatment Notes Wound #18 (Left Knee) 1. Cleansed with: Clean wound with Normal Saline 2. Anesthetic Topical Lidocaine 4% cream  to wound bed prior to debridement 4. Dressing Applied: Other dressing (specify in notes) 5. Secondary Dressing Applied ABD Pad Kerlix/Conform Notes kerlix, coban, unna to anchor Electronic Signature(s) Signed: 09/12/2017 4:19:12 PM By: Curtis Sites Entered By: Curtis Sites on 09/12/2017 08:15:30 Mastrogiovanni, [name] ([id number]) -------------------------------------------------------------------------------- Wound Assessment Details Patient Name: Silverman, Yuuki [name]. Date of Service: 09/12/2017 8:00 AM Medical Record Number: [id number] Patient Account Number: [id number] Date of Birth/Sex: 10-11-1960 (57 y.o. M) Treating RN: Curtis Sites Primary Care Rishita Petron: Hillery Aldo Other Clinician: Referring Cloyce Paterson: Hillery Aldo Treating Charels Stambaugh/Extender: Linwood Dibbles, HOYT Weeks in Treatment: 5 Wound Status Wound Number: 19 Primary Trauma, Other Etiology: Wound Location: Right Lower Leg - Medial Wound Open Wounding Event: Trauma Status: Date Acquired: 09/12/2017 Comorbid Lymphedema, Sleep Apnea,  Arrhythmia, Deep Weeks Of Treatment: 0 History: Vein Thrombosis, Hypertension, Myocardial Clustered Wound: Yes Infarction, Type II Diabetes, Neuropathy Wound Measurements Length: (cm) 7 Width: (cm) 9 Depth: (cm) 0.1 Clustered Quantity: 9 Area: (cm) 49.48 Volume: (cm) 4.948 % Reduction in Area: 0% % Reduction in Volume: 0% Epithelialization: None Tunneling: No Undermining: No Wound Description Full Thickness Without Exposed Support Classification: Structures Wound Margin: Distinct, outline attached Exudate Large Amount: Exudate Type: Serosanguineous Exudate Color: red, brown Foul Odor After Cleansing: No Slough/Fibrino Yes Wound Bed Granulation Amount: Large (67-100%) Exposed Structure Necrotic Amount: Small (1-33%) Fascia Exposed: No Necrotic Quality: Eschar Fat Layer (Subcutaneous Tissue) Exposed: No Tendon Exposed: No Muscle Exposed: No Joint Exposed: No Bone Exposed:  No Periwound Skin Texture Texture Color No Abnormalities Noted: No No Abnormalities Noted: No Moisture Temperature / Pain No Abnormalities Noted: No Temperature: No Abnormality Tenderness on Palpation: Yes Wound Preparation Ulcer Cleansing: Rinsed/Irrigated with Saline Topical Anesthetic Applied: Other: lidocaine 4%, Madan, Child [name]. ([id number]) Treatment Notes Wound #19 (Right, Medial Lower Leg) 1. Cleansed with: Clean wound with Normal Saline 2. Anesthetic Topical Lidocaine 4% cream to wound bed prior to debridement 4. Dressing Applied: Other dressing (specify in notes) 5. Secondary Dressing Applied ABD Pad Kerlix/Conform Notes kerlix, coban, unna to anchor Electronic Signature(s) Signed: 09/12/2017 4:19:12 PM By: Curtis Sites Entered By: Curtis Sites on 09/12/2017 08:20:52 Roane, [name] ([id number]) -------------------------------------------------------------------------------- Wound Assessment Details Patient Name: Dollinger, Cha [name]. Date of Service: 09/12/2017 8:00 AM Medical Record Number: [id number] Patient Account Number: [id number] Date of Birth/Sex: 1961/03/07 (57 y.o. M) Treating RN: Curtis Sites Primary Care Loyde Orth: Hillery Aldo Other Clinician: Referring Kyarah Enamorado: Hillery Aldo Treating Rolinda Impson/Extender: Linwood Dibbles, HOYT Weeks in Treatment: 5 Wound Status Wound Number: 20 Primary Trauma, Other Etiology: Wound Location: Left Lower Leg - Anterior Wound Open Wounding Event: Trauma Status: Date Acquired: 09/12/2017 Comorbid Lymphedema, Sleep Apnea, Arrhythmia, Deep Weeks Of Treatment: 0 History: Vein Thrombosis, Hypertension, Myocardial Clustered Wound: No Infarction, Type II Diabetes, Neuropathy Wound Measurements Length: (cm) 1.2 Width: (cm) 0.3 Depth: (cm) 0.1 Area: (cm) 0.283 Volume: (cm) 0.028 % Reduction in Area: % Reduction in Volume: Epithelialization: None Tunneling: No Undermining: No Wound Description Full Thickness Without  Exposed Support Foul Od Classification: Structures Slough/ Wound Margin: Distinct, outline attached Exudate Large Amount: Exudate Type: Serosanguineous Exudate Color: red, brown or After Cleansing: No Fibrino No Wound Bed Granulation Amount: Large (67-100%) Exposed Structure Granulation Quality: Red Fascia Exposed: No Necrotic Amount: None Present (0%) Fat Layer (Subcutaneous Tissue) Exposed: No Tendon Exposed: No Muscle Exposed: No Joint Exposed: No Bone Exposed: No Periwound Skin Texture Texture Color No Abnormalities Noted: No No Abnormalities Noted: No Moisture Temperature / Pain No Abnormalities Noted: No Temperature: No Abnormality Tenderness on Palpation: Yes Wound Preparation Ulcer Cleansing: Rinsed/Irrigated with Saline Topical Anesthetic Applied: None Kinzler, Monterrio [name]. ([id number]) Treatment Notes Wound #20 (Left, Anterior Lower Leg) 1. Cleansed with: Clean wound with Normal Saline 2. Anesthetic Topical Lidocaine 4% cream to wound bed prior to debridement 4. Dressing Applied: Other dressing (specify in notes) 5. Secondary Dressing Applied ABD Pad Kerlix/Conform Notes kerlix, coban, unna to anchor Electronic Signature(s) Signed: 09/12/2017 4:19:12 PM By: Curtis Sites Entered By: Curtis Sites on 09/12/2017 08:20:42 Raine, [name] ([id number]) -------------------------------------------------------------------------------- Vitals Details Patient Name: Bamford, Tannon [name]. Date of Service: 09/12/2017 8:00 AM Medical Record Number: [id number] Patient Account Number: [id number] Date of Birth/Sex: 11/30/1960 (57 y.o. M) Treating RN: Curtis Sites Primary Care Neeva Trew: Hillery Aldo Other Clinician: Referring Leylany Nored: Hillery Aldo Treating Sonam Wandel/Extender: Larina Bras  III, HOYT Weeks in Treatment: 5 Vital Signs Time Taken: 08:11 Temperature (F): 98.1 Height (in): 74 Pulse (bpm): 94 Weight (lbs): 310 Respiratory Rate (breaths/min): 20 Body Mass Index (BMI):  39.8 Blood Pressure (mmHg): 118/69 Reference Range: 80 - 120 mg / dl Electronic Signature(s) Signed: 09/12/2017 4:19:12 PM By: Curtis Sites Entered By: Curtis Sites on 09/12/2017 08:11:43

## 2017-09-16 NOTE — Progress Notes (Signed)
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 Saline. May shower with protection. Wound #20 Left,Anterior Lower Leg: Clean wound with Normal Saline. May shower with protection. Skin Barriers/Peri-Wound Care: Wound #16 Right,Anterior Lower Leg: Moisturizing lotion Wound #18 Left Knee: Moisturizing lotion Wound #19 Right,Medial Lower Leg: Moisturizing lotion Wound #20 Left,Anterior Lower Leg: Moisturizing lotion Primary Wound Dressing: Wound #16 Right,Anterior Lower Leg: Silver Alginate Wound #18 Left Knee: Silver Alginate Wound #19 Right,Medial Lower Leg: Silver Alginate Wound #20 Left,Anterior Lower Leg: Silver Alginate Secondary Dressing: Wound #16 Right,Anterior Lower Leg: ABD pad Kerlix and Coban Wound #18 Left Knee: ABD pad Kerlix and Coban Wound #19 Right,Medial Lower Leg: ABD pad Kerlix and Coban Wound #20 Left,Anterior Lower Leg: ABD pad Kerlix and Coban Dressing Change Frequency: Wound #16 Right,Anterior Lower Leg: Change dressing every week Wound #18 Left Knee: Change dressing every week Wound #19 Right,Medial Lower Leg: Heinke, Jaelon P. ([id number]) Change dressing every week Wound #20 Left,Anterior Lower Leg: Change dressing every week Follow-up  Appointments: Wound #16 Right,Anterior Lower Leg: Return Appointment in 1 week. Wound #18 Left Knee: Return Appointment in 1 week. Wound #19 Right,Medial Lower Leg: Return Appointment in 1 week. Wound #20 Left,Anterior Lower Leg: Return Appointment in 1 week. Edema Control: Wound #16 Right,Anterior Lower Leg: Kerlix and Coban - Bilateral Wound #18 Left Knee: Kerlix and Coban - Bilateral Wound #19 Right,Medial Lower Leg: Kerlix and Coban - Bilateral Wound #20 Left,Anterior Lower Leg: Kerlix and Coban - Bilateral Additional Orders / Instructions: Wound #16 Right,Anterior Lower Leg: Increase protein intake. Activity as tolerated Wound #18 Left Knee: Increase protein intake. Activity as tolerated Wound #19 Right,Medial Lower Leg: Increase protein intake. Activity as tolerated Wound #20 Left,Anterior Lower Leg: Increase protein intake. Activity as tolerated I am going to suggest at this point in time that we continue with the Current wound care measures although I discussed with patient that he needs to come up with a plan to either get Juxta-Lite compression or else compression stockings. He really does not want to compression stockings due to the fact that it's difficult for him to pull these up. I explained then that if he is not going to do that than the Juxta-Lite which would be $65 apiece would be the next option. He states that that's gonna be difficult for him to afford right now. Nonetheless once we are no longer wrapping him which may be even as soon as next week he is going to need some kind of compression going forward. It will however be up to him to decide and figure out what that is going to be. Please see above for specific wound care orders. We will see patient for re-evaluation in 1  week(s) here in the clinic. If anything worsens or changes patient will contact our office for additional recommendations. Electronic Signature(s) Signed: 09/14/2017 11:36:05 PM By:  Lenda Kelp PA-C Entered By: Lenda Kelp on 09/13/2017 19:41:13 Pribble, [name] ([id number]) -------------------------------------------------------------------------------- ROS/PFSH Details Patient Name: [name]. Date of Service: 09/12/2017 8:00 AM Medical Record Number: [id number] Patient Account Number: [id number] Date of Birth/Sex: 1961/04/21 (57 y.o. M) Treating RN: Curtis Sites Primary Care Provider: Hillery Aldo Other Clinician: Referring Provider: Hillery Aldo Treating Provider/Extender: Linwood Dibbles, Damyra Luscher Weeks in Treatment: 5 Information Obtained From Patient Wound History Do you currently have one or more open woundso Yes How many open wounds do you currently haveo 5 Approximately how long have you had your woundso several months How have you been treating your wound(s) until nowo doxycycline for cellulitis Has your wound(s) ever healed and then re-openedo No Have you had any lab work done in the past montho No Have you tested positive for an antibiotic resistant organism (MRSA, VRE)o Yes Have you tested positive for osteomyelitis (bone infection)o No Have you had any tests for circulation on your legso Yes Who ordered the testo cone 5 years ago Constitutional Symptoms (General Health) Complaints and Symptoms: Negative for: Fever; Chills Eyes Medical History: Negative for: Cataracts; Glaucoma; Optic Neuritis Ear/Nose/Mouth/Throat Medical History: Negative for: Chronic sinus problems/congestion; Middle ear problems Hematologic/Lymphatic Medical History: Positive for: Lymphedema Negative for: Anemia; Hemophilia; Human Immunodeficiency Virus; Sickle Cell Disease Respiratory Complaints and Symptoms: No Complaints or Symptoms Medical History: Positive for: Sleep Apnea - cpap and ventalator for trach Negative for: Aspiration; Asthma; Chronic Obstructive Pulmonary Disease (COPD); Pneumothorax; Tuberculosis Cardiovascular Complaints and Symptoms: No  Complaints or Symptoms Medical History: Robinette, Taelor P. ([id number]) Positive for: Arrhythmia - afib; Deep Vein Thrombosis; Hypertension; Myocardial Infarction Negative for: Angina; Congestive Heart Failure; Coronary Artery Disease; Hypotension; Peripheral Arterial Disease; Peripheral Venous Disease; Phlebitis Gastrointestinal Medical History: Negative for: Cirrhosis ; Colitis; Crohnos; Hepatitis A; Hepatitis B; Hepatitis C Endocrine Medical History: Positive for: Type II Diabetes Time with diabetes: 6 years Treated with: Insulin Blood sugar tested every day: No Blood sugar testing results: Bedtime: 132 Genitourinary Medical History: Negative for: End Stage Renal Disease Immunological Medical History: Negative for: Lupus Erythematosus; Raynaudos; Scleroderma Musculoskeletal Medical History: Negative for: Gout; Rheumatoid Arthritis; Osteoarthritis; Osteomyelitis Neurologic Medical History: Positive for: Neuropathy Negative for: Quadriplegia; Paraplegia; Seizure Disorder Oncologic Medical History: Negative for: Received Chemotherapy; Received Radiation Psychiatric Complaints and Symptoms: No Complaints or Symptoms Medical History: Negative for: Anorexia/bulimia; Confinement Anxiety Immunizations Pneumococcal Vaccine: Received Pneumococcal Vaccination: Yes Implantable Devices Chubbuck, Tryton P. ([id number]) Family and Social History Cancer: Yes - Siblings; Diabetes: No; Heart Disease: Yes - Siblings,Father,Mother,Paternal Grandparents,Maternal Grandparents; Hereditary Spherocytosis: No; Hypertension: Yes - Child,Siblings,Father,Paternal Grandparents,Maternal Grandparents; Kidney Disease: No; Lung Disease: No; Seizures: No; Stroke: No; Thyroid Problems: No; Tuberculosis: Yes - Maternal Grandparents; Never smoker; Marital Status - Divorced; Alcohol Use: Never; Drug Use: No History; Caffeine Use: Daily - coffee; Financial Concerns: No; Food, Clothing or Shelter Needs: No; Support  System Lacking: No; Transportation Concerns: No; Advanced Directives: No; Patient does not want information on Advanced Directives; Living Will: No Physician Affirmation I have reviewed and agree with the above information. Electronic Signature(s) Signed: 09/14/2017 11:36:05 PM By: Lenda Kelp PA-C Signed: 09/15/2017 4:46:08 PM By: Curtis Sites Entered By: Lenda Kelp on 09/13/2017 19:38:37 Elenbaas, [name] ([id number]) -------------------------------------------------------------------------------- SuperBill Details Patient Name: Patnaude, Hawkin P. Date of Service: 09/12/2017 Medical Record Number: [id number] Patient Account Number: [id number] Date of  Birth/Sex: Dec 02, 1960 (56 y.o. 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 I48.2 Chronic atrial fibrillation Facility Procedures CPT4 Code: [id number] Description: [id number] - WOUND CARE VISIT-LEV 5 EST PT Modifier: Quantity: 1 Physician Procedures CPT4 Code Description: [id number] - WC PHYS LEVEL 3 - EST PT ICD-10 Diagnosis Description I89.0 Lymphedema, not elsewhere classified E11.622 Type 2 diabetes mellitus with other skin ulcer L97.821 Non-pressure chronic ulcer of other part of left lower  leg lim  S61.200A Unspecified open wound of right index finger without damage to Modifier: ited to breakdown nail, initial en Quantity: 1 of skin counter Electronic Signature(s) Signed: 09/14/2017 11:36:05 PM By: Lenda KelpStone III, Clydette Privitera PA-C Entered By: Lenda KelpStone III, Lexa Coronado on 09/13/2017 19:42:00

## 2017-09-18 NOTE — Progress Notes (Signed)
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 0.063cm^3 volume. 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 ([id number]) Plan Wound Cleansing: Wound #16 Right,Anterior Lower Leg: Clean wound with Normal Saline. May shower with protection. Wound #18 Left Knee: Clean wound with Normal Saline. May shower with protection. Wound #19 Right,Medial Lower Leg: Clean wound with Normal Saline. May shower with protection. Wound #20 Left,Anterior Lower Leg: Clean wound with Normal Saline. May shower with protection. Skin Barriers/Peri-Wound Care: Wound #16 Right,Anterior Lower Leg: Moisturizing lotion Wound #18 Left Knee: Moisturizing lotion Wound #19 Right,Medial Lower Leg: Moisturizing lotion Wound #20 Left,Anterior Lower  Leg: Moisturizing lotion Primary Wound Dressing: Wound #16 Right,Anterior Lower Leg: Silver Alginate Wound #18 Left Knee: Silver Alginate Wound #19 Right,Medial Lower Leg: Silver Alginate Wound #20 Left,Anterior Lower Leg: Silver Alginate Secondary Dressing: Wound #16 Right,Anterior Lower Leg: ABD pad Kerlix and Coban Wound #18 Left Knee: ABD pad Kerlix and Coban Wound #19 Right,Medial Lower Leg: ABD pad Kerlix and Coban Wound #20 Left,Anterior Lower Leg: ABD pad Kerlix and Coban Dressing Change Frequency: Wound #16 Right,Anterior Lower Leg: Change dressing every week Wound #18 Left Knee: Change dressing every week Wound #19 Right,Medial Lower Leg: Change dressing every week [name], [name] ([id number]) Wound #20 Left,Anterior Lower Leg: Change dressing every week Follow-up Appointments: Wound #16 Right,Anterior Lower Leg: Return Appointment in 1 week. Wound #18 Left Knee: Return Appointment in 1 week. Wound #19 Right,Medial Lower Leg: Return Appointment in 1 week. Wound #20 Left,Anterior Lower Leg: Return Appointment in 1 week. Edema Control: Wound #16 Right,Anterior Lower Leg: Kerlix and Coban - Bilateral Wound #18 Left Knee: Kerlix and Coban - Bilateral Wound #19 Right,Medial Lower Leg: Kerlix and Coban - Bilateral Wound #20 Left,Anterior Lower Leg: Kerlix and Coban - Bilateral Additional Orders / Instructions: Wound #16 Right,Anterior Lower Leg: Increase protein intake. Activity as tolerated Wound #18 Left Knee: Increase protein intake. Activity as tolerated Wound #19 Right,Medial Lower Leg: Increase protein intake. Activity as tolerated Wound #20 Left,Anterior Lower Leg: Increase protein intake. Activity as tolerated Services and Therapies ordered were: Arterial Studies- Bilateral At this point I do not see any evidence of infection of the lower extremities which is good news. Subsequently also see no evidence of an intracranial  bleed patient  pupils equal at this point in time. They also reactive to light and accommodation. I doubt that he has anything more severe although he may have a concussion. I suggested that the patient at this point in time continue to have someone keep a close eye on him for at least the next 24 hours to ensure that nothing worsens. With that being said the fact that he is doing okay and we are now 24 I was out from the injury makes me think that he is probably okay. He was advised of what to look for however and to go to the ER as soon as possible things that he develops that she is unsure or concerned about. Please see above for specific wound care orders. We will see patient for re-evaluation in 1 week(s) here in the clinic. If anything worsens or changes patient will contact our office for additional recommendations. Electronic Signature(s) Signed: 09/17/2017 12:10:29 AM By: Lenda Kelp PA-C Entered By: Lenda Kelp on 09/16/2017 18:46:28 Kostka, [name] ([id number]) -------------------------------------------------------------------------------- ROS/PFSH Details Patient Name: [name]. Date of Service: 09/16/2017 1:00 PM Medical Record Number: [id number] Patient Account Number: [id number] Date of Birth/Sex: 08/22/1960 (57 y.o. M) Treating RN: Phillis Haggis Primary Care Provider: Hillery Aldo Other Clinician: Referring Provider: Hillery Aldo Treating Provider/Extender: Linwood Dibbles, HOYT Weeks in Treatment: 6 Information Obtained From Patient Wound History Do you currently have one or more open woundso Yes How many open wounds do you currently haveo 5 Approximately how long have you had your woundso several months How have you been treating your wound(s) until nowo doxycycline for cellulitis Has your wound(s) ever healed and then re-openedo No Have you had any lab work done in the past montho No Have you tested positive for an antibiotic resistant organism (MRSA, VRE)o Yes Have you  tested positive for osteomyelitis (bone infection)o No Have you had any tests for circulation on your legso Yes Who ordered the testo cone 5 years ago Constitutional Symptoms (General Health) Complaints and Symptoms: Negative for: Fever; Chills Cardiovascular Complaints and Symptoms: Positive for: LE edema Medical History: Positive for: Arrhythmia - afib; Deep Vein Thrombosis; Hypertension; Myocardial Infarction Negative for: Angina; Congestive Heart Failure; Coronary Artery Disease; Hypotension; Peripheral Arterial Disease; Peripheral Venous Disease; Phlebitis Eyes Medical History: Negative for: Cataracts; Glaucoma; Optic Neuritis Ear/Nose/Mouth/Throat Medical History: Negative for: Chronic sinus problems/congestion; Middle ear problems Hematologic/Lymphatic Medical History: Positive for: Lymphedema Negative for: Anemia; Hemophilia; Human Immunodeficiency Virus; Sickle Cell Disease Respiratory Complaints and Symptoms: No Complaints or Symptoms Berrones, Lyncoln P. ([id number]) Medical History: Positive for: Sleep Apnea - cpap and ventalator for trach Negative for: Aspiration; Asthma; Chronic Obstructive Pulmonary Disease (COPD); Pneumothorax; Tuberculosis Gastrointestinal Medical History: Negative for: Cirrhosis ; Colitis; Crohnos; Hepatitis A; Hepatitis B; Hepatitis C Endocrine Medical History: Positive for: Type II Diabetes Time with diabetes: 6 years Treated with: Insulin Blood sugar tested every day: No Blood sugar testing results: Bedtime: 132 Genitourinary Medical History: Negative for: End Stage Renal Disease Immunological Medical History: Negative for: Lupus Erythematosus; Raynaudos; Scleroderma Musculoskeletal Medical History: Negative for: Gout; Rheumatoid Arthritis; Osteoarthritis; Osteomyelitis Neurologic Medical History: Positive for: Neuropathy Negative for: Quadriplegia; Paraplegia; Seizure Disorder Oncologic Medical History: Negative for: Received  Chemotherapy; Received Radiation Psychiatric Complaints and Symptoms: No Complaints or Symptoms Medical History: Negative for: Anorexia/bulimia; Confinement Anxiety Immunizations Pneumococcal Vaccine: Received Pneumococcal Vaccination: Yes Implantable Devices Vanasten, Dacian P. ([id number]) Family and Social History Cancer: Yes - Siblings; Diabetes: No; Heart Disease: Yes - Siblings,Father,Mother,Paternal Grandparents,Maternal Grandparents;  Hereditary Spherocytosis: No; Hypertension: Yes - Child,Siblings,Father,Paternal Grandparents,Maternal Grandparents; Kidney Disease: No; Lung Disease: No; Seizures: No; Stroke: No; Thyroid Problems: No; Tuberculosis: Yes - Maternal Grandparents; Never smoker; Marital Status - Divorced; Alcohol Use: Never; Drug Use: No History; Caffeine Use: Daily - coffee; Financial Concerns: No; Food, Clothing or Shelter Needs: No; Support System Lacking: No; Transportation Concerns: No; Advanced Directives: No; Patient does not want information on Advanced Directives; Living Will: No Physician Affirmation I have reviewed and agree with the above information. Electronic Signature(s) Signed: 09/17/2017 12:10:29 AM By: Lenda Kelp PA-C Signed: 09/17/2017 4:37:47 PM By: Alejandro Mulling Entered By: Lenda Kelp on 09/16/2017 18:44:09 Maglione, [name] ([id number]) -------------------------------------------------------------------------------- SuperBill Details Patient Name: Molesky, Savan P. Date of Service: 09/16/2017 Medical Record Number: [id number] Patient Account Number: [id number] Date of Birth/Sex: Feb 21, 1961 (57 y.o. M) Treating RN: Phillis Haggis Primary Care Provider: Hillery Aldo Other Clinician: Referring Provider: Hillery Aldo Treating Provider/Extender: Linwood Dibbles, HOYT Weeks in Treatment: 6 Diagnosis Coding ICD-10 Codes Code Description I89.0 Lymphedema, not elsewhere classified E11.622 Type 2 diabetes mellitus with other skin ulcer L97.821  Non-pressure chronic ulcer of other part of left lower leg limited to breakdown of skin S61.200A Unspecified open wound of right index finger without damage to nail, initial encounter L97.811 Non-pressure chronic ulcer of other part of right lower leg limited to breakdown of skin 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 I48.2 Chronic atrial fibrillation Facility Procedures CPT4 Code: [id number] Description: 99214 - WOUND CARE VISIT-LEV 4 EST PT Modifier: Quantity: 1 Physician Procedures CPT4 Code Description: [id number] 99213 - WC PHYS LEVEL 3 - EST PT ICD-10 Diagnosis Description I89.0 Lymphedema, not elsewhere classified E11.622 Type 2 diabetes mellitus with other skin ulcer L97.821 Non-pressure chronic ulcer of other part of left lower  leg lim S61.200A Unspecified open wound of right index finger without damage to Modifier: ited to breakdown nail, initial en Quantity: 1 of skin counter Electronic Signature(s) Signed: 09/17/2017 12:10:29 AM By: Lenda Kelp PA-C Previous Signature: 09/16/2017 4:46:40 PM Version By: Alejandro Mulling Entered By: Lenda Kelp on 09/16/2017 18:46:58

## 2017-09-19 NOTE — Progress Notes (Signed)
QUE, MENEELY ([id number]) Visit Report for 09/16/2017 Arrival Information Details Patient Name: [name], [name]. Date of Service: 09/16/2017 1:00 PM Medical Record Number: [id number] Patient Account Number: [id number] Date of Birth/Sex: 01/24/1961 (57 y.o. M) Treating RN: Renne Crigler Primary Care Antonios Ostrow: Hillery Aldo Other Clinician: Referring Skylie Hiott: Hillery Aldo Treating Ivis Nicolson/Extender: Linwood Dibbles, HOYT Weeks in Treatment: 6 Visit Information History Since Last Visit All ordered tests and consults were completed: No Patient Arrived: Ambulatory Added or deleted any medications: No Arrival Time: 13:20 Any new allergies or adverse reactions: No Accompanied By: self Had a fall or experienced change in No Transfer Assistance: None activities of daily living that may affect Patient Identification Verified: Yes risk of falls: Secondary Verification Process Yes Signs or symptoms of abuse/neglect since last visito No Completed: Hospitalized since last visit: No Patient Requires Transmission-Based No Implantable device outside of the clinic excluding No Precautions: cellular tissue based products placed in the center Patient Has Alerts: Yes since last visit: Patient Alerts: noncompressible Pain Present Now: No bilateral Electronic Signature(s) Signed: 09/17/2017 7:46:43 AM By: Renne Crigler Entered By: Renne Crigler on 09/16/2017 13:21:55 Rosen, [name] ([id number]) -------------------------------------------------------------------------------- Clinic Level of Care Assessment Details Patient Name: Eggebrecht, Beecher P. Date of Service: 09/16/2017 1:00 PM Medical Record Number: [id number] Patient Account Number: [id number] Date of Birth/Sex: 11/29/1960 (57 y.o. M) Treating RN: Phillis Haggis Primary Care Lenwood Balsam: Hillery Aldo Other Clinician: Referring Jacqualynn Parco: Hillery Aldo Treating Ozetta Flatley/Extender: Linwood Dibbles, HOYT Weeks in Treatment: 6 Clinic Level of Care Assessment  Items TOOL 4 Quantity Score X - Use when only an EandM is performed on FOLLOW-UP visit 1 0 ASSESSMENTS - Nursing Assessment / Reassessment X - Reassessment of Co-morbidities (includes updates in patient status) 1 10 X- 1 5 Reassessment of Adherence to Treatment Plan ASSESSMENTS - Wound and Skin Assessment / Reassessment []  - Simple Wound Assessment / Reassessment - one wound 0 X- 4 5 Complex Wound Assessment / Reassessment - multiple wounds []  - 0 Dermatologic / Skin Assessment (not related to wound area) ASSESSMENTS - Focused Assessment []  - Circumferential Edema Measurements - multi extremities 0 []  - 0 Nutritional Assessment / Counseling / Intervention []  - 0 Lower Extremity Assessment (monofilament, tuning fork, pulses) []  - 0 Peripheral Arterial Disease Assessment (using hand held doppler) ASSESSMENTS - Ostomy and/or Continence Assessment and Care []  - Incontinence Assessment and Management 0 []  - 0 Ostomy Care Assessment and Management (repouching, etc.) PROCESS - Coordination of Care X - Simple Patient / Family Education for ongoing care 1 15 []  - 0 Complex (extensive) Patient / Family Education for ongoing care []  - 0 Staff obtains Chiropractor, Records, Test Results / Process Orders []  - 0 Staff telephones HHA, Nursing Homes / Clarify orders / etc []  - 0 Routine Transfer to another Facility (non-emergent condition) []  - 0 Routine Hospital Admission (non-emergent condition) []  - 0 New Admissions / Manufacturing engineer / Ordering NPWT, Apligraf, etc. []  - 0 Emergency Hospital Admission (emergent condition) X- 1 10 Simple Discharge Coordination Wessels, Hillery P. ([id number]) []  - 0 Complex (extensive) Discharge Coordination PROCESS - Special Needs []  - Pediatric / Minor Patient Management 0 []  - 0 Isolation Patient Management []  - 0 Hearing / Language / Visual special needs []  - 0 Assessment of Community assistance (transportation, D/C planning, etc.) []  -  0 Additional assistance / Altered mentation []  - 0 Support Surface(s) Assessment (bed, cushion, seat, etc.) INTERVENTIONS - Wound Cleansing / Measurement []  - Simple Wound Cleansing - one wound 0 X-  4 5 Complex Wound Cleansing - multiple wounds X- 1 5 Wound Imaging (photographs - any number of wounds) []  - 0 Wound Tracing (instead of photographs) []  - 0 Simple Wound Measurement - one wound X- 4 5 Complex Wound Measurement - multiple wounds INTERVENTIONS - Wound Dressings []  - Small Wound Dressing one or multiple wounds 0 []  - 0 Medium Wound Dressing one or multiple wounds X- 2 20 Large Wound Dressing one or multiple wounds X- 1 5 Application of Medications - topical []  - 0 Application of Medications - injection INTERVENTIONS - Miscellaneous []  - External ear exam 0 []  - 0 Specimen Collection (cultures, biopsies, blood, body fluids, etc.) []  - 0 Specimen(s) / Culture(s) sent or taken to Lab for analysis []  - 0 Patient Transfer (multiple staff / Nurse, adult / Similar devices) []  - 0 Simple Staple / Suture removal (25 or less) []  - 0 Complex Staple / Suture removal (26 or more) []  - 0 Hypo / Hyperglycemic Management (close monitor of Blood Glucose) []  - 0 Ankle / Brachial Index (ABI) - do not check if billed separately X- 1 5 Vital Signs Guidone, Dow P. ([id number]) Has the patient been seen at the hospital within the last three years: Yes Total Score: 155 Level Of Care: New/Established - Level 4 Electronic Signature(s) Signed: 09/17/2017 4:37:47 PM By: Alejandro Mulling Entered By: Alejandro Mulling on 09/16/2017 16:46:35 Kush, [name] ([id number]) -------------------------------------------------------------------------------- Encounter Discharge Information Details Patient Name: Hermida, Denim P. Date of Service: 09/16/2017 1:00 PM Medical Record Number: [id number] Patient Account Number: [id number] Date of Birth/Sex: 05/04/1961 (57 y.o. M) Treating RN: Curtis Sites Primary Care Zeshan Sena: Hillery Aldo Other Clinician: Referring Doral Ventrella: Hillery Aldo Treating Ilianna Bown/Extender: Linwood Dibbles, HOYT Weeks in Treatment: 6 Encounter Discharge Information Items Discharge Pain Level: 0 Discharge Condition: Stable Ambulatory Status: Ambulatory Discharge Destination: Home Transportation: Private Auto Schedule Follow-up Appointment: Yes Medication Reconciliation completed and No provided to Patient/Care Ahsan Esterline: Provided on Clinical Summary of Care: 09/16/2017 Form Type Recipient Paper Patient JC Electronic Signature(s) Signed: 09/17/2017 10:05:17 AM By: Gwenlyn Perking Entered By: Gwenlyn Perking on 09/16/2017 14:38:08 Femia, [name] ([id number]) -------------------------------------------------------------------------------- Lower Extremity Assessment Details Patient Name: Rajkumar, Huxley P. Date of Service: 09/16/2017 1:00 PM Medical Record Number: [id number] Patient Account Number: [id number] Date of Birth/Sex: 1961-04-02 (57 y.o. M) Treating RN: Renne Crigler Primary Care Kartik Fernando: Hillery Aldo Other Clinician: Referring Saesha Llerenas: Hillery Aldo Treating Lander Eslick/Extender: Linwood Dibbles, HOYT Weeks in Treatment: 6 Edema Assessment Assessed: [Left: No] [Right: No] [Left: Edema] [Right: :] Calf Left: Right: Point of Measurement: 38 cm From Medial Instep 49.6 cm 46.4 cm Ankle Left: Right: Point of Measurement: 14 cm From Medial Instep 34.6 cm 31.5 cm Vascular Assessment Pulses: Dorsalis Pedis Palpable: [Left:Yes] [Right:Yes] Posterior Tibial Extremity colors, hair growth, and conditions: Extremity Color: [Left:Hyperpigmented] [Right:Hyperpigmented] Hair Growth on Extremity: [Left:No] [Right:No] Temperature of Extremity: [Left:Warm] [Right:Warm] Capillary Refill: [Left:< 3 seconds] [Right:< 3 seconds] Toe Nail Assessment Left: Right: Thick: Yes Yes Discolored: Yes Yes Deformed: No No Improper Length and Hygiene: Yes Yes Electronic  Signature(s) Signed: 09/17/2017 7:46:43 AM By: Renne Crigler Entered By: Renne Crigler on 09/16/2017 13:36:55 Fojtik, [name] ([id number]) -------------------------------------------------------------------------------- Multi Wound Chart Details Patient Name: Fadely, Vonzell P. Date of Service: 09/16/2017 1:00 PM Medical Record Number: [id number] Patient Account Number: [id number] Date of Birth/Sex: July 31, 1960 (57 y.o. M) Treating RN: Huel Coventry Primary Care Coni Homesley: Hillery Aldo Other Clinician: Referring Martin Belling: Hillery Aldo Treating Theodor Mustin/Extender: Linwood Dibbles, HOYT Weeks in Treatment: 6 Vital Signs Height(in): 74  Pulse(bpm): 63 Weight(lbs): 310 Blood Pressure(mmHg): 129/75 Body Mass Index(BMI): 40 Temperature(F): 98.2 Respiratory Rate 20 (breaths/min): Photos: Wound Location: Right Lower Leg - Anterior Left Knee Right, Medial Lower Leg Wounding Event: Gradually Appeared Trauma Trauma Primary Etiology: Venous Leg Ulcer Trauma, Other Trauma, Other Comorbid History: Lymphedema, Sleep Apnea, N/A N/A Arrhythmia, Deep Vein Thrombosis, Hypertension, Myocardial Infarction, Type II Diabetes, Neuropathy Date Acquired: 08/15/2017 08/29/2017 09/12/2017 Weeks of Treatment: 4 1 0 Wound Status: Open Open Open Clustered Wound: No No Yes Measurements L x W x D 1.5x1.2x0.1 1.5x1.2x0.1 9x6.5x0.1 (cm) Area (cm) : 1.414 1.414 45.946 Volume (cm) : 0.141 0.141 4.595 % Reduction in Area: 76.00% -20.00% 7.10% % Reduction in Volume: 76.10% -19.50% 7.10% Classification: Partial Thickness Partial Thickness Full Thickness Without Exposed Support Structures Exudate Amount: Medium N/A N/A Exudate Type: Serosanguineous N/A N/A Exudate Color: red, brown N/A N/A Wound Margin: Distinct, outline attached N/A N/A Granulation Amount: None Present (0%) N/A N/A Necrotic Amount: Large (67-100%) N/A N/A Necrotic Tissue: Eschar, Adherent Slough N/A N/A Exposed Structures: Fascia: No N/A N/A Fat  Layer (Subcutaneous Hagemann, Nickalos P. ([id number]) Tissue) Exposed: No Tendon: No Muscle: No Joint: No Bone: No Epithelialization: None N/A N/A Periwound Skin Texture: No Abnormalities Noted No Abnormalities Noted No Abnormalities Noted Periwound Skin Moisture: No Abnormalities Noted No Abnormalities Noted No Abnormalities Noted Periwound Skin Color: No Abnormalities Noted No Abnormalities Noted No Abnormalities Noted Temperature: No Abnormality N/A N/A Tenderness on Palpation: Yes No No Wound Preparation: Ulcer Cleansing: N/A N/A Rinsed/Irrigated with Saline Topical Anesthetic Applied: Other: lidocaine 4% Wound Number: 20 N/A N/A Photos: N/A N/A Wound Location: Left, Anterior Lower Leg N/A N/A Wounding Event: Trauma N/A N/A Primary Etiology: Trauma, Other N/A N/A Comorbid History: N/A N/A N/A Date Acquired: 09/12/2017 N/A N/A Weeks of Treatment: 0 N/A N/A Wound Status: Open N/A N/A Clustered Wound: No N/A N/A Measurements L x W x D 2x0.4x0.1 N/A N/A (cm) Area (cm) : 0.628 N/A N/A Volume (cm) : 0.063 N/A N/A % Reduction in Area: -121.90% N/A N/A % Reduction in Volume: -125.00% N/A N/A Classification: Full Thickness Without N/A N/A Exposed Support Structures Exudate Amount: N/A N/A N/A Exudate Type: N/A N/A N/A Exudate Color: N/A N/A N/A Wound Margin: N/A N/A N/A Granulation Amount: N/A N/A N/A Necrotic Amount: N/A N/A N/A Necrotic Tissue: N/A N/A N/A Exposed Structures: N/A N/A N/A Epithelialization: N/A N/A N/A Periwound Skin Texture: No Abnormalities Noted N/A N/A Periwound Skin Moisture: No Abnormalities Noted N/A N/A Periwound Skin Color: No Abnormalities Noted N/A N/A Temperature: N/A N/A N/A Tenderness on Palpation: No N/A N/A Wound Preparation: N/A N/A N/A HERSEL, MCMEEN ([id number]) Treatment Notes Electronic Signature(s) Signed: 09/18/2017 9:10:06 AM By: Elliot Gurney, BSN, RN, CWS, Kim RN, BSN Entered By: Elliot Gurney, BSN, RN, CWS, Kim on 09/16/2017 14:16:32 Chisom,  [name] ([id number]) -------------------------------------------------------------------------------- Pain Assessment Details Patient Name: Badley, Antiono P. Date of Service: 09/16/2017 1:00 PM Medical Record Number: [id number] Patient Account Number: [id number] Date of Birth/Sex: 1961-02-08 (57 y.o. M) Treating RN: Renne Crigler Primary Care Rodgerick Gilliand: Hillery Aldo Other Clinician: Referring Delane Stalling: Hillery Aldo Treating Khameron Gruenwald/Extender: Linwood Dibbles, HOYT Weeks in Treatment: 6 Active Problems Location of Pain Severity and Description of Pain Patient Has Paino No Site Locations Pain Management and Medication Current Pain Management: Electronic Signature(s) Signed: 09/17/2017 7:46:43 AM By: Renne Crigler Entered By: Renne Crigler on 09/16/2017 13:22:01 Dimock, [name] ([id number]) -------------------------------------------------------------------------------- Patient/Caregiver Education Details Patient Name: [name]. Date of Service: 09/16/2017 1:00 PM Medical Record Number: [id number] Patient Account  Number: [id number] Date of Birth/Gender: 12/09/1960 (57 y.o. M) Treating RN: Curtis Sitesorthy, Joanna Primary Care Physician: Hillery AldoPATEL, SARAH Other Clinician: Referring Physician: Hillery AldoPATEL, SARAH Treating Physician/Extender: Skeet SimmerSTONE III, HOYT Weeks in Treatment: 6 Education Assessment Education Provided To: Patient Education Topics Provided Wound Debridement: Handouts: Wound Debridement Methods: Explain/Verbal Responses: State content correctly Wound/Skin Impairment: Handouts: Caring for Your Ulcer Methods: Explain/Verbal Responses: State content correctly Electronic Signature(s) Signed: 09/16/2017 4:44:49 PM By: Curtis Sitesorthy, Joanna Entered By: Curtis Sitesorthy, Joanna on 09/16/2017 14:31:41 Weldon, Marcanthony P. ([id number]) -------------------------------------------------------------------------------- Wound Assessment Details Patient Name: Vasey, Iasiah P. Date of Service: 09/16/2017 1:00 PM Medical  Record Number: [id number] Patient Account Number: [id number] Date of Birth/Sex: 12/09/1960 (57 y.o. M) Treating RN: Renne CriglerFlinchum, Cheryl Primary Care Maresa Morash: Hillery AldoPATEL, SARAH Other Clinician: Referring Salaam Battershell: Hillery AldoPATEL, SARAH Treating Deantae Shackleton/Extender: Linwood DibblesSTONE III, HOYT Weeks in Treatment: 6 Wound Status Wound Number: 16 Primary Venous Leg Ulcer Etiology: Wound Location: Right Lower Leg - Anterior Wound Open Wounding Event: Gradually Appeared Status: Date Acquired: 08/15/2017 Comorbid Lymphedema, Sleep Apnea, Arrhythmia, Deep Weeks Of Treatment: 4 History: Vein Thrombosis, Hypertension, Myocardial Clustered Wound: No Infarction, Type II Diabetes, Neuropathy Photos Photo Uploaded By: Curtis Sitesorthy, Joanna on 09/16/2017 14:10:00 Wound Measurements Length: (cm) 1.5 Width: (cm) 1.2 Depth: (cm) 0.1 Area: (cm) 1.414 Volume: (cm) 0.141 % Reduction in Area: 76% % Reduction in Volume: 76.1% Epithelialization: None Wound Description Classification: Partial Thickness Wound Margin: Distinct, outline attached Exudate Amount: Medium Exudate Type: Serosanguineous Exudate Color: red, brown Foul Odor After Cleansing: No Slough/Fibrino Yes Wound Bed Granulation Amount: None Present (0%) Exposed Structure Necrotic Amount: Large (67-100%) Fascia Exposed: No Necrotic Quality: Eschar, Adherent Slough Fat Layer (Subcutaneous Tissue) Exposed: No Tendon Exposed: No Muscle Exposed: No Joint Exposed: No Bone Exposed: No Periwound Skin Texture Stringfield, Tore P. ([id number]) Texture Color No Abnormalities Noted: No No Abnormalities Noted: No Moisture Temperature / Pain No Abnormalities Noted: No Temperature: No Abnormality Tenderness on Palpation: Yes Wound Preparation Ulcer Cleansing: Rinsed/Irrigated with Saline Topical Anesthetic Applied: Other: lidocaine 4%, Treatment Notes Wound #16 (Right, Anterior Lower Leg) 1. Cleansed with: Clean wound with Normal Saline 2. Anesthetic Topical Lidocaine  4% cream to wound bed prior to debridement 4. Dressing Applied: Other dressing (specify in notes) 5. Secondary Dressing Applied ABD Pad Kerlix/Conform Notes silvercell, unna boot to anchor, kerlix and Theatre managercoban Electronic Signature(s) Signed: 09/17/2017 7:46:43 AM By: Renne CriglerFlinchum, Cheryl Entered By: Renne CriglerFlinchum, Cheryl on 09/16/2017 13:32:31 Bee, Laren EvertsJIM P. ([id number]) -------------------------------------------------------------------------------- Wound Assessment Details Patient Name: Orrego, Sirius P. Date of Service: 09/16/2017 1:00 PM Medical Record Number: [id number] Patient Account Number: [id number] Date of Birth/Sex: 12/09/1960 (57 y.o. M) Treating RN: Renne CriglerFlinchum, Cheryl Primary Care August Longest: Hillery AldoPATEL, SARAH Other Clinician: Referring Claudene Gatliff: Hillery AldoPATEL, SARAH Treating Veronda Gabor/Extender: Linwood DibblesSTONE III, HOYT Weeks in Treatment: 6 Wound Status Wound Number: 18 Primary Etiology: Trauma, Other Wound Location: Left Knee Wound Status: Open Wounding Event: Trauma Date Acquired: 08/29/2017 Weeks Of Treatment: 1 Clustered Wound: No Photos Photo Uploaded By: Curtis Sitesorthy, Joanna on 09/16/2017 14:10:01 Wound Measurements Length: (cm) 1.5 Width: (cm) 1.2 Depth: (cm) 0.1 Area: (cm) 1.414 Volume: (cm) 0.141 % Reduction in Area: -20% % Reduction in Volume: -19.5% Wound Description Classification: Partial Thickness Periwound Skin Texture Texture Color No Abnormalities Noted: No No Abnormalities Noted: No Moisture No Abnormalities Noted: No Treatment Notes Wound #18 (Left Knee) 1. Cleansed with: Clean wound with Normal Saline 2. Anesthetic Topical Lidocaine 4% cream to wound bed prior to debridement 4. Dressing Applied: Lindh, Javius P. ([id number]) Other dressing (specify in notes) 5. Secondary Dressing Applied ABD Pad Kerlix/Conform  Notes silvercell, unna boot to anchor, kerlix and Theatre manager) Signed: 09/17/2017 7:46:43 AM By: Renne Crigler Entered By: Renne Crigler on  09/16/2017 13:30:10 Causer, [name] ([id number]) -------------------------------------------------------------------------------- Wound Assessment Details Patient Name: Rivard, Donny P. Date of Service: 09/16/2017 1:00 PM Medical Record Number: [id number] Patient Account Number: [id number] Date of Birth/Sex: 1961/05/28 (57 y.o. M) Treating RN: Renne Crigler Primary Care Carlei Huang: Hillery Aldo Other Clinician: Referring Shruthi Northrup: Hillery Aldo Treating Bertram Haddix/Extender: Linwood Dibbles, HOYT Weeks in Treatment: 6 Wound Status Wound Number: 19 Primary Etiology: Trauma, Other Wound Location: Right, Medial Lower Leg Wound Status: Open Wounding Event: Trauma Date Acquired: 09/12/2017 Weeks Of Treatment: 0 Clustered Wound: Yes Photos Photo Uploaded By: Curtis Sites on 09/16/2017 14:10:25 Wound Measurements Length: (cm) 9 Width: (cm) 6.5 Depth: (cm) 0.1 Area: (cm) 45.946 Volume: (cm) 4.595 % Reduction in Area: 7.1% % Reduction in Volume: 7.1% Wound Description Full Thickness Without Exposed Support Classification: Structures Periwound Skin Texture Texture Color No Abnormalities Noted: No No Abnormalities Noted: No Moisture No Abnormalities Noted: No Treatment Notes Wound #19 (Right, Medial Lower Leg) 1. Cleansed with: Clean wound with Normal Saline 2. Anesthetic Topical Lidocaine 4% cream to wound bed prior to debridement Gonser, Demetrie P. ([id number]) 4. Dressing Applied: Other dressing (specify in notes) 5. Secondary Dressing Applied ABD Pad Kerlix/Conform Notes silvercell, unna boot to anchor, kerlix and Theatre manager) Signed: 09/17/2017 7:46:43 AM By: Renne Crigler Entered By: Renne Crigler on 09/16/2017 13:29:28 Haydel, [name] ([id number]) -------------------------------------------------------------------------------- Wound Assessment Details Patient Name: Hiltz, Othar P. Date of Service: 09/16/2017 1:00 PM Medical Record Number:  [id number] Patient Account Number: [id number] Date of Birth/Sex: 1961/06/20 (57 y.o. M) Treating RN: Renne Crigler Primary Care Hajar Penninger: Hillery Aldo Other Clinician: Referring Devany Aja: Hillery Aldo Treating Meckenzie Balsley/Extender: Linwood Dibbles, HOYT Weeks in Treatment: 6 Wound Status Wound Number: 20 Primary Etiology: Trauma, Other Wound Location: Left, Anterior Lower Leg Wound Status: Open Wounding Event: Trauma Date Acquired: 09/12/2017 Weeks Of Treatment: 0 Clustered Wound: No Photos Photo Uploaded By: Curtis Sites on 09/16/2017 14:10:26 Wound Measurements Length: (cm) 2 Width: (cm) 0.4 Depth: (cm) 0.1 Area: (cm) 0.628 Volume: (cm) 0.063 % Reduction in Area: -121.9% % Reduction in Volume: -125% Wound Description Full Thickness Without Exposed Support Classification: Structures Periwound Skin Texture Texture Color No Abnormalities Noted: No No Abnormalities Noted: No Moisture No Abnormalities Noted: No Treatment Notes Wound #20 (Left, Anterior Lower Leg) 1. Cleansed with: Clean wound with Normal Saline 2. Anesthetic Topical Lidocaine 4% cream to wound bed prior to debridement Fencl, Nyxon P. ([id number]) 4. Dressing Applied: Other dressing (specify in notes) 5. Secondary Dressing Applied ABD Pad Kerlix/Conform Notes silvercell, unna boot to anchor, kerlix and Theatre manager) Signed: 09/17/2017 7:46:43 AM By: Renne Crigler Entered By: Renne Crigler on 09/16/2017 13:30:49 Silva, [name] ([id number]) -------------------------------------------------------------------------------- Vitals Details Patient Name: Hottenstein, Bradlee P. Date of Service: 09/16/2017 1:00 PM Medical Record Number: [id number] Patient Account Number: [id number] Date of Birth/Sex: [birth year] (57 y.o. M) Treating RN: Renne Crigler Primary Care Kameo Bains: Hillery Aldo Other Clinician: Referring Warnell Rasnic: Hillery Aldo Treating Tiaria Biby/Extender: Linwood Dibbles, HOYT Weeks in  Treatment: 6 Vital Signs Time Taken: 13:22 Temperature (F): 98.2 Height (in): 74 Pulse (bpm): 63 Weight (lbs): 310 Respiratory Rate (breaths/min): 20 Body Mass Index (BMI): 39.8 Blood Pressure (mmHg): 129/75 Reference Range: 80 - 120 mg / dl Electronic Signature(s) Signed: 09/17/2017 7:46:43 AM By: Renne Crigler Entered By: Renne Crigler on 09/16/2017 13:22:23

## 2017-09-23 ENCOUNTER — Ambulatory Visit: Payer: Medicare HMO | Admitting: Physician Assistant

## 2017-10-02 ENCOUNTER — Encounter: Payer: Medicare HMO | Attending: Nurse Practitioner | Admitting: Nurse Practitioner

## 2017-10-02 DIAGNOSIS — L97821 Non-pressure chronic ulcer of other part of left lower leg limited to breakdown of skin: Secondary | ICD-10-CM | POA: Diagnosis present

## 2017-10-02 DIAGNOSIS — L98492 Non-pressure chronic ulcer of skin of other sites with fat layer exposed: Secondary | ICD-10-CM | POA: Insufficient documentation

## 2017-10-02 DIAGNOSIS — S60420A Blister (nonthermal) of right index finger, initial encounter: Secondary | ICD-10-CM | POA: Diagnosis not present

## 2017-10-02 DIAGNOSIS — S90822A Blister (nonthermal), left foot, initial encounter: Secondary | ICD-10-CM | POA: Diagnosis not present

## 2017-10-02 DIAGNOSIS — I890 Lymphedema, not elsewhere classified: Secondary | ICD-10-CM | POA: Insufficient documentation

## 2017-10-02 DIAGNOSIS — I110 Hypertensive heart disease with heart failure: Secondary | ICD-10-CM | POA: Insufficient documentation

## 2017-10-02 DIAGNOSIS — Z955 Presence of coronary angioplasty implant and graft: Secondary | ICD-10-CM | POA: Diagnosis not present

## 2017-10-02 DIAGNOSIS — E11621 Type 2 diabetes mellitus with foot ulcer: Secondary | ICD-10-CM | POA: Insufficient documentation

## 2017-10-02 DIAGNOSIS — G4730 Sleep apnea, unspecified: Secondary | ICD-10-CM | POA: Insufficient documentation

## 2017-10-02 DIAGNOSIS — L97811 Non-pressure chronic ulcer of other part of right lower leg limited to breakdown of skin: Secondary | ICD-10-CM | POA: Diagnosis not present

## 2017-10-02 DIAGNOSIS — I5042 Chronic combined systolic (congestive) and diastolic (congestive) heart failure: Secondary | ICD-10-CM | POA: Diagnosis not present

## 2017-10-02 DIAGNOSIS — Y92480 Sidewalk as the place of occurrence of the external cause: Secondary | ICD-10-CM | POA: Insufficient documentation

## 2017-10-02 DIAGNOSIS — L97512 Non-pressure chronic ulcer of other part of right foot with fat layer exposed: Secondary | ICD-10-CM | POA: Insufficient documentation

## 2017-10-02 DIAGNOSIS — J449 Chronic obstructive pulmonary disease, unspecified: Secondary | ICD-10-CM | POA: Insufficient documentation

## 2017-10-02 DIAGNOSIS — I252 Old myocardial infarction: Secondary | ICD-10-CM | POA: Diagnosis not present

## 2017-10-02 DIAGNOSIS — I482 Chronic atrial fibrillation: Secondary | ICD-10-CM | POA: Insufficient documentation

## 2017-10-02 DIAGNOSIS — Z86711 Personal history of pulmonary embolism: Secondary | ICD-10-CM | POA: Diagnosis not present

## 2017-10-02 DIAGNOSIS — F329 Major depressive disorder, single episode, unspecified: Secondary | ICD-10-CM | POA: Insufficient documentation

## 2017-10-02 DIAGNOSIS — E11622 Type 2 diabetes mellitus with other skin ulcer: Secondary | ICD-10-CM | POA: Insufficient documentation

## 2017-10-02 DIAGNOSIS — E1140 Type 2 diabetes mellitus with diabetic neuropathy, unspecified: Secondary | ICD-10-CM | POA: Insufficient documentation

## 2017-10-02 DIAGNOSIS — Z950 Presence of cardiac pacemaker: Secondary | ICD-10-CM | POA: Insufficient documentation

## 2017-10-07 ENCOUNTER — Encounter: Payer: Self-pay | Admitting: Emergency Medicine

## 2017-10-07 ENCOUNTER — Other Ambulatory Visit: Payer: Self-pay

## 2017-10-07 ENCOUNTER — Emergency Department: Payer: Medicare HMO

## 2017-10-07 ENCOUNTER — Inpatient Hospital Stay
Admission: EM | Admit: 2017-10-07 | Discharge: 2017-10-09 | DRG: 641 | Disposition: A | Discharge status: Home / Self Care | Payer: Medicare HMO | Attending: Internal Medicine | Admitting: Internal Medicine

## 2017-10-07 DIAGNOSIS — Z930 Tracheostomy status: Secondary | ICD-10-CM

## 2017-10-07 DIAGNOSIS — Z86711 Personal history of pulmonary embolism: Secondary | ICD-10-CM | POA: Diagnosis not present

## 2017-10-07 DIAGNOSIS — J9610 Chronic respiratory failure, unspecified whether with hypoxia or hypercapnia: Secondary | ICD-10-CM | POA: Diagnosis present

## 2017-10-07 DIAGNOSIS — E871 Hypo-osmolality and hyponatremia: Secondary | ICD-10-CM | POA: Diagnosis present

## 2017-10-07 DIAGNOSIS — E1142 Type 2 diabetes mellitus with diabetic polyneuropathy: Secondary | ICD-10-CM | POA: Diagnosis present

## 2017-10-07 DIAGNOSIS — I110 Hypertensive heart disease with heart failure: Secondary | ICD-10-CM | POA: Diagnosis present

## 2017-10-07 DIAGNOSIS — E785 Hyperlipidemia, unspecified: Secondary | ICD-10-CM | POA: Diagnosis present

## 2017-10-07 DIAGNOSIS — K219 Gastro-esophageal reflux disease without esophagitis: Secondary | ICD-10-CM | POA: Diagnosis present

## 2017-10-07 DIAGNOSIS — Z79899 Other long term (current) drug therapy: Secondary | ICD-10-CM | POA: Diagnosis not present

## 2017-10-07 DIAGNOSIS — I252 Old myocardial infarction: Secondary | ICD-10-CM

## 2017-10-07 DIAGNOSIS — K760 Fatty (change of) liver, not elsewhere classified: Secondary | ICD-10-CM | POA: Diagnosis present

## 2017-10-07 DIAGNOSIS — Z8249 Family history of ischemic heart disease and other diseases of the circulatory system: Secondary | ICD-10-CM | POA: Diagnosis not present

## 2017-10-07 DIAGNOSIS — Z6841 Body Mass Index (BMI) 40.0 and over, adult: Secondary | ICD-10-CM

## 2017-10-07 DIAGNOSIS — F1010 Alcohol abuse, uncomplicated: Secondary | ICD-10-CM | POA: Diagnosis present

## 2017-10-07 DIAGNOSIS — Z794 Long term (current) use of insulin: Secondary | ICD-10-CM | POA: Diagnosis not present

## 2017-10-07 DIAGNOSIS — G4733 Obstructive sleep apnea (adult) (pediatric): Secondary | ICD-10-CM | POA: Diagnosis present

## 2017-10-07 DIAGNOSIS — Z881 Allergy status to other antibiotic agents status: Secondary | ICD-10-CM | POA: Diagnosis not present

## 2017-10-07 DIAGNOSIS — Z888 Allergy status to other drugs, medicaments and biological substances status: Secondary | ICD-10-CM | POA: Diagnosis not present

## 2017-10-07 DIAGNOSIS — Z880 Allergy status to penicillin: Secondary | ICD-10-CM | POA: Diagnosis not present

## 2017-10-07 DIAGNOSIS — I5032 Chronic diastolic (congestive) heart failure: Secondary | ICD-10-CM | POA: Diagnosis present

## 2017-10-07 DIAGNOSIS — E46 Unspecified protein-calorie malnutrition: Secondary | ICD-10-CM | POA: Diagnosis present

## 2017-10-07 DIAGNOSIS — Z955 Presence of coronary angioplasty implant and graft: Secondary | ICD-10-CM | POA: Diagnosis not present

## 2017-10-07 DIAGNOSIS — Z7901 Long term (current) use of anticoagulants: Secondary | ICD-10-CM | POA: Diagnosis not present

## 2017-10-07 DIAGNOSIS — I480 Paroxysmal atrial fibrillation: Secondary | ICD-10-CM | POA: Diagnosis present

## 2017-10-07 LAB — COMPREHENSIVE METABOLIC PANEL
ALK PHOS: 119 U/L (ref 38–126)
ALT: 22 U/L (ref 17–63)
ANION GAP: 11 (ref 5–15)
AST: 41 U/L (ref 15–41)
Albumin: 4.1 g/dL (ref 3.5–5.0)
BILIRUBIN TOTAL: 0.7 mg/dL (ref 0.3–1.2)
BUN: 16 mg/dL (ref 6–20)
CO2: 23 mmol/L (ref 22–32)
Calcium: 8.8 mg/dL — ABNORMAL LOW (ref 8.9–10.3)
Chloride: 86 mmol/L — ABNORMAL LOW (ref 101–111)
Creatinine, Ser: 0.96 mg/dL (ref 0.61–1.24)
GFR calc non Af Amer: >60 mL/min (ref >60)
Glucose, Bld: 107 mg/dL — ABNORMAL HIGH (ref 65–99)
Potassium: 4.9 mmol/L (ref 3.5–5.1)
SODIUM: 120 mmol/L — AB (ref 135–145)
TOTAL PROTEIN: 7.1 g/dL (ref 6.5–8.1)

## 2017-10-07 LAB — CBC
HCT: 34.6 % — ABNORMAL LOW (ref 40.0–52.0)
HEMOGLOBIN: 11.8 g/dL — AB (ref 13.0–18.0)
MCH: 30.6 pg (ref 26.0–34.0)
MCHC: 34.2 g/dL (ref 32.0–36.0)
MCV: 89.5 fL (ref 80.0–100.0)
Platelets: 298 10*3/uL (ref 150–440)
RBC: 3.87 MIL/uL — ABNORMAL LOW (ref 4.40–5.90)
RDW: 13.4 % (ref 11.5–14.5)
WBC: 5.4 10*3/uL (ref 3.8–10.6)

## 2017-10-07 LAB — CARBAMAZEPINE LEVEL, TOTAL: Carbamazepine Lvl: <2 ug/mL — ABNORMAL LOW (ref 4.0–12.0)

## 2017-10-07 MED ORDER — GABAPENTIN 400 MG PO CAPS
1600.0000 mg | ORAL_CAPSULE | Freq: Two times a day (BID) | ORAL | Status: DC
  Administered 2017-10-08 – 2017-10-09 (×4): 1600 mg via ORAL
  Filled 2017-10-07 (×2): qty 16
  Filled 2017-10-07: qty 4
  Filled 2017-10-07: qty 16
  Filled 2017-10-07 (×4): qty 4

## 2017-10-07 MED ORDER — OXYCODONE HCL 5 MG PO TABS
5.0000 mg | ORAL_TABLET | Freq: Four times a day (QID) | ORAL | Status: DC | PRN
  Administered 2017-10-08 – 2017-10-09 (×3): 5 mg via ORAL
  Filled 2017-10-07 (×3): qty 1

## 2017-10-07 MED ORDER — FOLIC ACID 1 MG PO TABS: 1.0000 mg | ORAL_TABLET | Freq: Every day | ORAL | Status: DC

## 2017-10-07 MED ORDER — OXYCODONE-ACETAMINOPHEN 10-325 MG PO TABS: 1.0000 | ORAL_TABLET | Freq: Four times a day (QID) | ORAL | Status: DC | PRN

## 2017-10-07 MED ORDER — ALBUTEROL SULFATE (2.5 MG/3ML) 0.083% IN NEBU: 2.5000 mg | INHALATION_SOLUTION | RESPIRATORY_TRACT | Status: DC | PRN

## 2017-10-07 MED ORDER — ENALAPRIL MALEATE 10 MG PO TABS
10.0000 mg | ORAL_TABLET | Freq: Two times a day (BID) | ORAL | Status: DC
  Administered 2017-10-08 (×2): 10 mg via ORAL
  Filled 2017-10-07 (×4): qty 1

## 2017-10-07 MED ORDER — AMIODARONE HCL 200 MG PO TABS
200.0000 mg | ORAL_TABLET | Freq: Two times a day (BID) | ORAL | Status: DC
  Administered 2017-10-08 – 2017-10-09 (×3): 200 mg via ORAL
  Filled 2017-10-07 (×4): qty 1

## 2017-10-07 MED ORDER — POLYETHYLENE GLYCOL 3350 17 G PO PACK: 17.0000 g | PACK | Freq: Every day | ORAL | Status: DC | PRN

## 2017-10-07 MED ORDER — FOLIC ACID 1 MG PO TABS
1.0000 mg | ORAL_TABLET | Freq: Every day | ORAL | Status: DC
  Administered 2017-10-08 – 2017-10-09 (×3): 1 mg via ORAL
  Filled 2017-10-07 (×4): qty 1

## 2017-10-07 MED ORDER — INSULIN DETEMIR 100 UNIT/ML FLEXPEN: 15.0000 [IU] | Freq: Two times a day (BID) | SUBCUTANEOUS | Status: DC

## 2017-10-07 MED ORDER — ACETAMINOPHEN 650 MG RE SUPP: 650.0000 mg | Freq: Four times a day (QID) | RECTAL | Status: DC | PRN

## 2017-10-07 MED ORDER — RIVAROXABAN 20 MG PO TABS
20.0000 mg | ORAL_TABLET | Freq: Every day | ORAL | Status: DC
  Administered 2017-10-08 – 2017-10-09 (×2): 20 mg via ORAL
  Filled 2017-10-07 (×2): qty 1

## 2017-10-07 MED ORDER — SODIUM CHLORIDE 0.9 % IV SOLN
INTRAVENOUS | Status: DC
  Administered 2017-10-08: via INTRAVENOUS

## 2017-10-07 MED ORDER — INSULIN DETEMIR 100 UNIT/ML ~~LOC~~ SOLN
15.0000 [IU] | Freq: Two times a day (BID) | SUBCUTANEOUS | Status: DC
  Administered 2017-10-08 – 2017-10-09 (×3): 15 [IU] via SUBCUTANEOUS
  Filled 2017-10-07 (×6): qty 0.15

## 2017-10-07 MED ORDER — ACETAMINOPHEN 325 MG PO TABS: 650.0000 mg | ORAL_TABLET | Freq: Four times a day (QID) | ORAL | Status: DC | PRN

## 2017-10-07 MED ORDER — THIAMINE HCL 100 MG/ML IJ SOLN: 100.0000 mg | Freq: Every day | INTRAMUSCULAR | Status: DC

## 2017-10-07 MED ORDER — ONDANSETRON HCL 4 MG/2ML IJ SOLN: 4.0000 mg | Freq: Four times a day (QID) | INTRAMUSCULAR | Status: DC | PRN

## 2017-10-07 MED ORDER — SODIUM CHLORIDE 1 G PO TABS
1.0000 g | ORAL_TABLET | Freq: Two times a day (BID) | ORAL | Status: DC
  Administered 2017-10-08 – 2017-10-09 (×4): 1 g via ORAL
  Filled 2017-10-07 (×6): qty 1

## 2017-10-07 MED ORDER — METOPROLOL TARTRATE 50 MG PO TABS
100.0000 mg | ORAL_TABLET | Freq: Two times a day (BID) | ORAL | Status: DC
  Administered 2017-10-08: 100 mg via ORAL
  Filled 2017-10-07 (×3): qty 2

## 2017-10-07 MED ORDER — OXYCODONE-ACETAMINOPHEN 5-325 MG PO TABS
1.0000 | ORAL_TABLET | Freq: Four times a day (QID) | ORAL | Status: DC | PRN
  Administered 2017-10-09: 11:00:00 1 via ORAL
  Filled 2017-10-07: qty 1

## 2017-10-07 MED ORDER — LORAZEPAM 2 MG/ML IJ SOLN
1.0000 mg | Freq: Four times a day (QID) | INTRAMUSCULAR | Status: DC | PRN
  Administered 2017-10-08: 1 mg via INTRAVENOUS
  Filled 2017-10-07: qty 1

## 2017-10-07 MED ORDER — LORAZEPAM 1 MG PO TABS
1.0000 mg | ORAL_TABLET | Freq: Four times a day (QID) | ORAL | Status: DC | PRN
  Administered 2017-10-09 (×2): 1 mg via ORAL
  Filled 2017-10-07 (×2): qty 1

## 2017-10-07 MED ORDER — ADULT MULTIVITAMIN W/MINERALS CH
1.0000 | ORAL_TABLET | Freq: Every day | ORAL | Status: DC
  Administered 2017-10-08 – 2017-10-09 (×3): 1 via ORAL
  Filled 2017-10-07 (×3): qty 1

## 2017-10-07 MED ORDER — SODIUM CHLORIDE 0.9 % IV BOLUS
1000.0000 mL | Freq: Once | INTRAVENOUS | Status: AC
  Administered 2017-10-07: 1000 mL via INTRAVENOUS

## 2017-10-07 MED ORDER — LAMOTRIGINE 25 MG PO TABS
150.0000 mg | ORAL_TABLET | Freq: Two times a day (BID) | ORAL | Status: DC
  Administered 2017-10-08 – 2017-10-09 (×4): 150 mg via ORAL
  Filled 2017-10-07 (×4): qty 1

## 2017-10-07 MED ORDER — BUSPIRONE HCL 10 MG PO TABS
10.0000 mg | ORAL_TABLET | Freq: Two times a day (BID) | ORAL | Status: DC
  Administered 2017-10-08 – 2017-10-09 (×4): 10 mg via ORAL
  Filled 2017-10-07 (×5): qty 1

## 2017-10-07 MED ORDER — VITAMIN B-1 100 MG PO TABS
100.0000 mg | ORAL_TABLET | Freq: Every day | ORAL | Status: DC
  Administered 2017-10-08 – 2017-10-09 (×3): 100 mg via ORAL
  Filled 2017-10-07 (×3): qty 1

## 2017-10-07 MED ORDER — ENOXAPARIN SODIUM 40 MG/0.4ML ~~LOC~~ SOLN
40.0000 mg | Freq: Two times a day (BID) | SUBCUTANEOUS | Status: DC
  Filled 2017-10-07: qty 0.4

## 2017-10-07 MED ORDER — ALPRAZOLAM 0.5 MG PO TABS
0.5000 mg | ORAL_TABLET | Freq: Two times a day (BID) | ORAL | Status: DC | PRN
  Administered 2017-10-08: 0.5 mg via ORAL
  Filled 2017-10-07: qty 1

## 2017-10-07 MED ORDER — PANTOPRAZOLE SODIUM 40 MG PO TBEC
40.0000 mg | DELAYED_RELEASE_TABLET | Freq: Every day | ORAL | Status: DC
  Administered 2017-10-08 – 2017-10-09 (×2): 40 mg via ORAL
  Filled 2017-10-07 (×2): qty 1

## 2017-10-07 MED ORDER — ONDANSETRON HCL 4 MG PO TABS: 4.0000 mg | ORAL_TABLET | Freq: Four times a day (QID) | ORAL | Status: DC | PRN

## 2017-10-07 MED ORDER — SODIUM CHLORIDE 0.9% FLUSH
3.0000 mL | Freq: Two times a day (BID) | INTRAVENOUS | Status: DC
  Administered 2017-10-08 – 2017-10-09 (×4): 3 mL via INTRAVENOUS

## 2017-10-07 MED ORDER — ATORVASTATIN CALCIUM 20 MG PO TABS
40.0000 mg | ORAL_TABLET | Freq: Every day | ORAL | Status: DC
  Administered 2017-10-08: 40 mg via ORAL
  Filled 2017-10-07: qty 2

## 2017-10-07 NOTE — ED Provider Notes (Signed)
[facility] Emergency Department Provider Note   ____________________________________________   First MD Initiated Contact with Patient 10/07/17 2024     (approximate)  I have reviewed the triage vital signs and the nursing notes.   HISTORY  Chief Complaint Abnormal Lab  HPI [name] is a 57 y.o. male patient called by preop testing and told to come in for low sodium.  Her sodium is 120.  Patient reports she has been weak and his knees buckled on him yesterday.  He had a previous admission for an even lower sodium which was treated with some fluid and desmopressin he recovered and went home.  He is scheduled for a bronchoscopy for an endobronchial lesion.   Past Medical History:  Diagnosis Date  . CHF (congestive heart failure) ([place])   . Diabetes mellitus ([place])   . Diabetic peripheral neuropathy ([place])   . Elevated liver enzymes    [facility]ty liver [facility]  . GERD (gastroesophageal reflux disease)   . History of pulmonary embolus (PE)   . Hyperlipidemia   . Hypertension   . Myocardial infarct ([place])   . OSA (obstructive sleep apnea)   . 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 Renal insufficiency     Patient Active Problem List   Diagnosis Date Noted  . RUQ pain   . Atrial fibrillation ([place]) 11/02/2016  . Chest pain 11/02/2016  . Hyponatremia 11/02/2016  . Chronic respiratory failure, unspecified whether with hypoxia or hypercapnia ([place]) 05/16/2014  . Ventilator dependence ([place]) 05/16/2014  . Obesity hypoventilation syndrome ([place]) 05/16/2014  . 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 CARDIAC SURGERY     With stent placement  . FLEXIBLE BRONCHOSCOPY N/A 07/28/2017   Procedure: FLEXIBLE BRONCHOSCOPY;  Surgeon: [name], MD;  Location: [facility] ORS;  Service: Pulmonary;  Laterality: N/A;  . LEFT HEART CATH AND CORONARY ANGIOGRAPHY N/A 09/01/2017   Procedure: LEFT HEART CATH AND CORONARY ANGIOGRAPHY and PCI stent;  Surgeon: Yolonda Kida, MD;  Location: [place] CV LAB;  Service: Cardiovascular;  Laterality: N/A;  . TRACHEOSTOMY  2007  . VASECTOMY      Prior to Admission medications   Medication Sig Start Date End Date Taking? Authorizing Provider  acetaminophen (TYLENOL) 325 MG tablet Take 1 tablet (325 mg total) by mouth every 6 (six) hours as needed for mild pain (or Fever >/= 101). 09/02/17  Yes Gouru, Illene Silver, MD  ALPRAZolam Duanne Moron) 0.5 MG tablet Take 1 tablet (0.5 mg total) by mouth every 8 (eight) hours as needed for anxiety. 09/02/17  Yes Gouru, Illene Silver, MD  amiodarone (PACERONE) 200 MG tablet Take 200 mg by mouth 2 (two) times daily. 09/24/17  Yes [provider]  atorvastatin (LIPITOR) 80 MG tablet Take 80 mg by mouth at bedtime. 08/23/17  Yes [provider]  busPIRone (BUSPAR) 10 MG tablet Take 10 mg by mouth 2 (two) times daily as needed.    Yes [provider]  collagenase (SANTYL) ointment Apply topically daily. 07/20/17  Yes Dustin Flock, MD  enalapril (VASOTEC) 10 MG tablet Take 10 mg by mouth 2 (two) times daily. 08/03/17  Yes [provider]  feeding supplement, GLUCERNA SHAKE, (GLUCERNA SHAKE) LIQD Take 237 mLs by mouth 2 (two) times daily between meals. 09/02/17  Yes Gouru, Illene Silver, MD  folic acid (FOLVITE) 1 MG tablet Take 1 tablet (1 mg total) by mouth daily. 09/03/17  Yes Gouru, Aruna, MD  furosemide (LASIX) 20 MG tablet Take 1 tablet (20 mg total) by mouth as needed for fluid or edema. Patient taking differently: Take 20 mg by mouth daily.  09/02/17 09/02/18 Yes Gouru, Illene Silver, MD  gabapentin (NEURONTIN) 800 MG tablet Take 1 tablet (800 mg total) by mouth 3 (three) times daily. 1 tablet twice daily Patient taking differently: Take 1,600 mg by mouth 2 (two) times daily.  09/02/17  Yes Gouru, Aruna, MD  insulin detemir (LEVEMIR) 100 unit/ml SOLN Inject 20 Units into the skin 2 (two) times  daily.    Yes [provider]  insulin lispro (HUMALOG) 100 UNIT/ML injection Inject 2-10 Units into the skin 3 (three) times daily with meals.    Yes [provider]  lamoTRIgine (LAMICTAL) 150 MG tablet Take 150 mg by mouth 2 (two) times daily.  07/02/17  Yes [provider]  metFORMIN (GLUCOPHAGE-XR) 500 MG 24 hr tablet Take 1,000 mg by mouth 2 (two) times daily.  01/24/14  Yes [provider]  metoprolol (LOPRESSOR) 100 MG tablet Take 100 mg by mouth 2 (two) times daily. 100 mg twice daily   Yes [provider]  Multiple Vitamin (MULTIVITAMIN WITH MINERALS) TABS tablet Take 1 tablet by mouth daily. 09/03/17  Yes Gouru, Illene Silver, MD  omeprazole (PRILOSEC) 20 MG capsule Take 20 mg by mouth daily. Once daily   Yes [provider]  oxyCODONE-acetaminophen (PERCOCET) 10-325 MG tablet Take 1 tablet by mouth 4 (four) times daily as needed for pain.    Yes [provider]  polyethylene glycol (MIRALAX / GLYCOLAX) packet Take 17 g by mouth daily as needed for mild constipation. 09/02/17  Yes Gouru, Illene Silver, MD  rivaroxaban (XARELTO) 20 MG TABS tablet Take 20 mg by mouth daily.   Yes [provider]  atorvastatin (LIPITOR) 40 MG tablet Take 1 tablet (40 mg total) by mouth daily at 6 PM. Patient not taking: Reported on 10/07/2017 11/04/16   Flora Lipps, MD  carbamazepine (TEGRETOL) 200 MG tablet Take 2 tablets (400 mg total) by mouth daily at 10 pm. Patient not taking: Reported on 07/17/2017 11/04/16   Flora Lipps, MD  senna-docusate (SENOKOT-S) 8.6-50 MG tablet Take 2 tablets by mouth at bedtime as needed for mild constipation. Patient not taking: Reported on 10/07/2017 09/02/17   Nicholes Mango, MD  thiamine 100 MG tablet Take 1 tablet (100 mg total) by mouth daily. Patient not taking: Reported on 10/07/2017 09/03/17   Nicholes Mango, MD    Allergies Cephalexin; Hctz  [hydrochlorothiazide]; Hydralazine; Penicillins; Reserpine; and Zaroxolyn  [metolazone]  Family History  Problem Relation Age of Onset  . Heart attack Mother   . Coronary artery disease Father   . Heart attack Father   . Kidney cancer Neg Hx   . Kidney disease Neg Hx   . Prostate cancer Neg Hx     Social History Social History   Tobacco Use  . Smoking status: Never Smoker  . Smokeless tobacco: Never Used  Substance Use Topics  . Alcohol use: Yes    Alcohol/week: 3.6 - 4.2 oz    Types: 6 - 7 Cans of beer per week  . Drug use: No    Review of Systems  Constitutional: No fever/chills Eyes: No visual changes. ENT: No sore throat. Cardiovascular: Denies chest pain. Respiratory: Denies shortness of breath. Gastrointestinal: No abdominal pain.  No nausea, no vomiting.  No diarrhea.  No constipation. Genitourinary: Negative for dysuria. Musculoskeletal: Negative for back pain.  Skin: Negative for rash. Neurological: Negative for headaches, focal weakness   ____________________________________________   PHYSICAL EXAM:  VITAL SIGNS: ED Triage Vitals  Enc Vitals Group     BP 10/07/17 1936 103/61     Pulse Rate 10/07/17 1936 (!) 50     Resp 10/07/17 1936 18     Temp 10/07/17 1936 98.6 F (37 C)     Temp Source 10/07/17 1936 Oral     SpO2 10/07/17 1936 95 %     Weight 10/07/17 1934 (!) 313 lb (142 kg)     Height 10/07/17 1934 _0  (1.88 m)     Head Circumference --      Peak Flow --      Pain Score 10/07/17 1934 0     Pain Loc --      Pain Edu? --      Excl. in [place]? --     Constitutional: Alert and oriented. Well appearing and in no acute distress. Eyes: Conjunctivae are normal. . Head: Atraumatic. Nose: No congestion/rhinnorhea. Mouth/Throat: Mucous membranes are moist.  Oropharynx non-erythematous. Neck: No stridor.  Tracheostomy present  cardiovascular: Normal rate, regular rhythm. Grossly normal heart sounds.  Good peripheral circulation. Respiratory: Normal respiratory effort.  No retractions. Lungs CTAB. Gastrointestinal: Soft  and nontender. No distention. No abdominal bruits. No CVA tenderness. }Musculoskeletal: No lower extremity tenderness bilateral edema.   Neurologic:  Normal speech and language. No gross focal neurologic deficits are appreciated.  Skin:  Skin is warm, dry and intact. No rash noted. Psychiatric: Mood and affect are normal. Speech and behavior are normal.  ____________________________________________   LABS (all labs ordered are listed, but only abnormal results are displayed)  Labs Reviewed  CBC - Abnormal; Notable for the following components:      Result Value   RBC 3.87 (*)    Hemoglobin 11.8 (*)    HCT 34.6 (*)    All other components within normal limits  COMPREHENSIVE METABOLIC PANEL - Abnormal; Notable for the following components:   Sodium 120 (*)    Chloride 86 (*)    Glucose, Bld 107 (*)    Calcium 8.8 (*)    All other components within normal limits  CARBAMAZEPINE LEVEL, TOTAL - Abnormal; Notable for the following components:   Carbamazepine Lvl <2.0 (*)    All other components within normal limits  BASIC METABOLIC PANEL  CBC   ____________________________________________  EKG  EKG read and interpreted by me shows a flutter with a ventricular rate of 52 there is a demand pacemaker there are some PVCs I do not see any acute ST-T wave changes ____________________________________________  RADIOLOGY  ED MD interpretation:    Official radiology report(s): Dg Chest 2 View  Result Date: 10/07/2017 CLINICAL DATA:  57 y/o M; hyponatremia with history of hyponatremia. EXAM: CHEST - 2 VIEW COMPARISON:  08/27/2017 chest radiograph FINDINGS: Stable cardiomegaly. No focal consolidation, effusion, or pneumothorax. Tracheostomy tube in cardiac implant noted. Bones are unremarkable. IMPRESSION: Stable cardiomegaly.  No acute pulmonary process identified. Electronically Signed   By: Kristine Garbe M.D.   On: 10/07/2017 20:32     ____________________________________________   PROCEDURES  Procedure(s) performed:   Procedures  Critical Care performed:   ____________________________________________   INITIAL IMPRESSION / ASSESSMENT AND PLAN / ED COURSE  Patient's sodium is low.  He is also somewhat weak.  This may be due to the endobronchial lesion that he has.  We will admit him and significant fixed hyponatremia and dressing the  rest of his problems.         ____________________________________________   FINAL CLINICAL IMPRESSION(S) / ED DIAGNOSES  Final diagnoses:  Hyponatremia     ED Discharge Orders    None       Note:  This document was prepared using Dragon voice recognition software and may include unintentional dictation errors.    Nena Polio, MD 10/07/17 2257

## 2017-10-07 NOTE — H&P (Signed)
[facility] [place] NAME: [name]    MR#:  [id number]  DATE OF BIRTH:  25-Sep-1960  DATE OF ADMISSION:  10/07/2017  PRIMARY CARE PHYSICIAN: Denton Lank, MD   REQUESTING/REFERRING PHYSICIAN: Rip Harbour  CHIEF COMPLAINT:   Chief Complaint  Patient presents with  . Abnormal Lab    HISTORY OF PRESENT ILLNESS:  [name]  is a 57 y.o. male with a known history of hypertension, diabetes mellitus, chronic diastolic CHF, alcohol abuse presents to the hospital due to dizziness and weakness.  Patient had blood work checked for his pre-colonoscopy evaluation.  He was found to have sodium of 120 and sent to the emergency room. Patient was recently in the hospital for the same.  This was thought to be multifactorial.  Sodium improved 132 when he was discharged home.  Patient continues to drink 7-8 beers a day.  PAST MEDICAL HISTORY:   Past Medical History:  Diagnosis Date  . CHF (congestive heart failure) ([place])   . Diabetes mellitus ([place])   . Diabetic peripheral neuropathy ([place])   . Elevated liver enzymes    [facility]  . GERD (gastroesophageal reflux disease)   . History of pulmonary embolus (PE)   . Hyperlipidemia   . Hypertension   . Myocardial infarct ([place])   . OSA (obstructive sleep apnea)   . 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 Renal insufficiency     PAST SURGICAL HISTORY:   Past Surgical History:  Procedure Laterality Date  . CARDIAC SURGERY     With stent placement  . FLEXIBLE BRONCHOSCOPY N/A 07/28/2017   Procedure: FLEXIBLE BRONCHOSCOPY;  Surgeon: [name], MD;  Location: [facility] ORS;  Service: Pulmonary;  Laterality: N/A;  . LEFT HEART CATH AND CORONARY ANGIOGRAPHY N/A 09/01/2017   Procedure: LEFT HEART CATH AND CORONARY ANGIOGRAPHY and PCI stent;  Surgeon: Yolonda Kida, MD;  Location: [place] CV LAB;  Service: Cardiovascular;  Laterality: N/A;  .  TRACHEOSTOMY  2007  . VASECTOMY      SOCIAL HISTORY:   Social History   Tobacco Use  . Smoking status: Never Smoker  . Smokeless tobacco: Never Used  Substance Use Topics  . Alcohol use: Yes    Alcohol/week: 3.6 - 4.2 oz    Types: 6 - 7 Cans of beer per week    FAMILY HISTORY:   Family History  Problem Relation Age of Onset  . Heart attack Mother   . Coronary artery disease Father   . Heart attack Father   . Kidney cancer Neg Hx   . Kidney disease Neg Hx   . Prostate cancer Neg Hx     DRUG ALLERGIES:   Allergies  Allergen Reactions  . Cephalexin     Other reaction(s): Unknown  . Hctz  [Hydrochlorothiazide]     Other reaction(s): Other (See Comments) hyponatremia  . Hydralazine   . Penicillins Hives    Has patient had a PCN reaction causing immediate rash, facial/tongue/throat swelling, SOB or lightheadedness with hypotension: Yes Has patient had a PCN reaction causing severe rash involving mucus membranes or skin necrosis: Yes Has patient had a PCN reaction that required hospitalization: Unknown Has patient had a PCN reaction occurring within the last 10 years: No If all of the above answers are "NO", then may proceed with Cephalosporin use.   Marland Kitchen Reserpine   . Zaroxolyn [Metolazone]     Other  reaction(s): Unknown    REVIEW OF SYSTEMS:   Review of Systems  Constitutional: Positive for malaise/fatigue. Negative for chills and fever.  HENT: Negative for sore throat.   Eyes: Negative for blurred vision, double vision and pain.  Respiratory: Negative for cough, hemoptysis, shortness of breath and wheezing.   Cardiovascular: Negative for chest pain, palpitations, orthopnea and leg swelling.  Gastrointestinal: Negative for abdominal pain, constipation, diarrhea, heartburn, nausea and vomiting.  Genitourinary: Negative for dysuria and hematuria.  Musculoskeletal: Negative for back pain and joint pain.  Skin: Negative for rash.  Neurological: Negative for sensory  change, speech change, focal weakness and headaches.  Endo/Heme/Allergies: Does not bruise/bleed easily.  Psychiatric/Behavioral: Negative for depression. The patient is not nervous/anxious.     MEDICATIONS AT HOME:   Prior to Admission medications   Medication Sig Start Date End Date Taking? Authorizing Provider  acetaminophen (TYLENOL) 325 MG tablet Take 1 tablet (325 mg total) by mouth every 6 (six) hours as needed for mild pain (or Fever >/= 101). 09/02/17  Yes Gouru, Illene Silver, MD  ALPRAZolam Duanne Moron) 0.5 MG tablet Take 1 tablet (0.5 mg total) by mouth every 8 (eight) hours as needed for anxiety. 09/02/17  Yes Gouru, Illene Silver, MD  amiodarone (PACERONE) 200 MG tablet Take 200 mg by mouth 2 (two) times daily. 09/24/17  Yes [provider]  atorvastatin (LIPITOR) 80 MG tablet Take 80 mg by mouth at bedtime. 08/23/17  Yes [provider]  busPIRone (BUSPAR) 10 MG tablet Take 10 mg by mouth 2 (two) times daily as needed.    Yes [provider]  collagenase (SANTYL) ointment Apply topically daily. 07/20/17  Yes Dustin Flock, MD  enalapril (VASOTEC) 10 MG tablet Take 10 mg by mouth 2 (two) times daily. 08/03/17  Yes [provider]  feeding supplement, GLUCERNA SHAKE, (GLUCERNA SHAKE) LIQD Take 237 mLs by mouth 2 (two) times daily between meals. 09/02/17  Yes Gouru, Illene Silver, MD  folic acid (FOLVITE) 1 MG tablet Take 1 tablet (1 mg total) by mouth daily. 09/03/17  Yes Gouru, Illene Silver, MD  furosemide (LASIX) 20 MG tablet Take 1 tablet (20 mg total) by mouth as needed for fluid or edema. Patient taking differently: Take 20 mg by mouth daily.  09/02/17 09/02/18 Yes Gouru, Illene Silver, MD  gabapentin (NEURONTIN) 800 MG tablet Take 1 tablet (800 mg total) by mouth 3 (three) times daily. 1 tablet twice daily Patient taking differently: Take 1,600 mg by mouth 2 (two) times daily.  09/02/17  Yes Gouru, Aruna, MD  insulin detemir (LEVEMIR) 100 unit/ml SOLN Inject 20 Units into the skin 2 (two) times  daily.    Yes [provider]  insulin lispro (HUMALOG) 100 UNIT/ML injection Inject 2-10 Units into the skin 3 (three) times daily with meals.    Yes [provider]  lamoTRIgine (LAMICTAL) 150 MG tablet Take 150 mg by mouth 2 (two) times daily.  07/02/17  Yes [provider]  metFORMIN (GLUCOPHAGE-XR) 500 MG 24 hr tablet Take 1,000 mg by mouth 2 (two) times daily.  01/24/14  Yes [provider]  metoprolol (LOPRESSOR) 100 MG tablet Take 100 mg by mouth 2 (two) times daily. 100 mg twice daily   Yes [provider]  Multiple Vitamin (MULTIVITAMIN WITH MINERALS) TABS tablet Take 1 tablet by mouth daily. 09/03/17  Yes Gouru, Illene Silver, MD  omeprazole (PRILOSEC) 20 MG capsule Take 20 mg by mouth daily. Once daily   Yes [provider]  oxyCODONE-acetaminophen (PERCOCET) 10-325 MG tablet  Take 1 tablet by mouth 4 (four) times daily as needed for pain.    Yes [provider]  polyethylene glycol (MIRALAX / GLYCOLAX) packet Take 17 g by mouth daily as needed for mild constipation. 09/02/17  Yes Gouru, Illene Silver, MD  rivaroxaban (XARELTO) 20 MG TABS tablet Take 20 mg by mouth daily.   Yes [provider]  atorvastatin (LIPITOR) 40 MG tablet Take 1 tablet (40 mg total) by mouth daily at 6 PM. Patient not taking: Reported on 10/07/2017 11/04/16   Flora Lipps, MD  carbamazepine (TEGRETOL) 200 MG tablet Take 2 tablets (400 mg total) by mouth daily at 10 pm. Patient not taking: Reported on 07/17/2017 11/04/16   Flora Lipps, MD  senna-docusate (SENOKOT-S) 8.6-50 MG tablet Take 2 tablets by mouth at bedtime as needed for mild constipation. Patient not taking: Reported on 10/07/2017 09/02/17   Nicholes Mango, MD  thiamine 100 MG tablet Take 1 tablet (100 mg total) by mouth daily. Patient not taking: Reported on 10/07/2017 09/03/17   Nicholes Mango, MD     VITAL SIGNS:  Blood pressure 103/61, pulse (!) 50, temperature 98.6 F (37 C), temperature source Oral, resp.  rate 18, height _0  (1.88 m), weight (!) 142 kg (313 lb), SpO2 95 %.  PHYSICAL EXAMINATION:  Physical Exam  GENERAL:  57 y.o.-year-old patient lying in the bed with no acute distress.  Obese EYES: Pupils equal, round, reactive to light and accommodation. No scleral icterus. Extraocular muscles intact.  HEENT: Head atraumatic, normocephalic. Oropharynx and nasopharynx clear. No oropharyngeal erythema, moist oral mucosa  NECK:  Supple, no jugular venous distention. No thyroid enlargement, no tenderness.  LUNGS: Normal breath sounds bilaterally, no wheezing, rales, rhonchi. No use of accessory muscles of respiration.  CARDIOVASCULAR: S1, S2 normal. No murmurs, rubs, or gallops.  ABDOMEN: Soft, nontender, nondistended. Bowel sounds present. No organomegaly or mass.  EXTREMITIES: No cyanosis, or clubbing. + 2 pedal & radial pulses b/l.  Bilateral lower extremity edema NEUROLOGIC: Cranial nerves II through XII are intact. No focal Motor or sensory deficits appreciated b/l PSYCHIATRIC: [name]. Good affect.  SKIN: Bilateral lower externally ulcers LABORATORY PANEL:   CBC Recent Labs  Lab 10/07/17 1938  WBC 5.4  HGB 11.8*  HCT 34.6*  PLT 298   ------------------------------------------------------------------------------------------------------------------  Chemistries  Recent Labs  Lab 10/07/17 1938  NA 120*  K 4.9  CL 86*  CO2 23  GLUCOSE 107*  BUN 16  CREATININE 0.96  CALCIUM 8.8*  AST 41  ALT 22  ALKPHOS 119  BILITOT 0.7   ------------------------------------------------------------------------------------------------------------------  Cardiac Enzymes No results for input(s): TROPONINI in the last 168 hours. ------------------------------------------------------------------------------------------------------------------  RADIOLOGY:  Dg Chest 2 View  Result Date: 10/07/2017 CLINICAL DATA:  57 y/o M; hyponatremia with history of  hyponatremia. EXAM: CHEST - 2 VIEW COMPARISON:  08/27/2017 chest radiograph FINDINGS: Stable cardiomegaly. No focal consolidation, effusion, or pneumothorax. Tracheostomy tube in cardiac implant noted. Bones are unremarkable. IMPRESSION: Stable cardiomegaly.  No acute pulmonary process identified. Electronically Signed   By: Kristine Garbe M.D.   On: 10/07/2017 20:32   IMPRESSION AND PLAN:   *Multifactorial hyponatremia due to pulmonary issues, beer intake, malnutrition. Will start patient on slow normal saline.  Start salt tablets.  No signs of fluid overload in the lungs.  Hold Lasix tonight.  Repeat sodium in the morning.  *Hypertension.  Continue home medications.  *Diabetes mellitus.  Continue Lantus at a lower dose.  Sliding scale  insulin.  *Paroxysmal atrial fibrillation.  Continue amiodarone and metoprolol.  Continue Xarelto.  *Chronic diastolic congestive heart failure. Hold Lasix at this time.  All the records are reviewed and case discussed with ED provider. Management plans discussed with the patient, family and they are in agreement.  CODE STATUS: FULL CODE  TOTAL TIME TAKING CARE OF THIS PATIENT: 40 minutes.   Neita Carp M.D on 10/07/2017 at 9:43 PM  Between 7am to 6pm - Pager - [phone]  After 6pm go to [email or web address] - password [email or web address]  Office  [phone]  CC: Primary care physician; Denton Lank, MD  Note: This dictation was prepared with Dragon dictation along with smaller phrase technology. Any transcriptional errors that result from this process are unintentional.

## 2017-10-07 NOTE — ED Triage Notes (Addendum)
Pt to triage via w/c with no distress noted; Pre-op testing performed today; called by MD and informed Na was low; pt with no c/o at present; wife reports pt with hx of hyponatremia

## 2017-10-07 NOTE — Progress Notes (Signed)
Lovenox changed to BID for BMI >40 and CrCl >30.  

## 2017-10-08 LAB — BASIC METABOLIC PANEL
Anion gap: 6 (ref 5–15)
BUN: 16 mg/dL (ref 6–20)
CALCIUM: 9 mg/dL (ref 8.9–10.3)
CO2: 29 mmol/L (ref 22–32)
CREATININE: 0.94 mg/dL (ref 0.61–1.24)
Chloride: 89 mmol/L — ABNORMAL LOW (ref 101–111)
Glucose, Bld: 128 mg/dL — ABNORMAL HIGH (ref 65–99)
Potassium: 4.3 mmol/L (ref 3.5–5.1)
SODIUM: 124 mmol/L — AB (ref 135–145)

## 2017-10-08 LAB — GLUCOSE, CAPILLARY
GLUCOSE-CAPILLARY: 123 mg/dL — AB (ref 65–99)
GLUCOSE-CAPILLARY: 186 mg/dL — AB (ref 65–99)
Glucose-Capillary: 115 mg/dL — ABNORMAL HIGH (ref 65–99)
Glucose-Capillary: 181 mg/dL — ABNORMAL HIGH (ref 65–99)
Glucose-Capillary: 110 mg/dL — ABNORMAL HIGH (ref 65–99)

## 2017-10-08 LAB — CBC
HCT: 31.7 % — ABNORMAL LOW (ref 40.0–52.0)
Hemoglobin: 10.8 g/dL — ABNORMAL LOW (ref 13.0–18.0)
MCH: 30.7 pg (ref 26.0–34.0)
MCHC: 34.1 g/dL (ref 32.0–36.0)
MCV: 90 fL (ref 80.0–100.0)
PLATELETS: 282 10*3/uL (ref 150–440)
RBC: 3.53 MIL/uL — AB (ref 4.40–5.90)
RDW: 13.8 % (ref 11.5–14.5)
WBC: 4.6 10*3/uL (ref 3.8–10.6)

## 2017-10-08 LAB — HEMOGLOBIN A1C
HEMOGLOBIN A1C: 5.7 % — AB (ref 4.8–5.6)
MEAN PLASMA GLUCOSE: 116.89 mg/dL

## 2017-10-08 MED ORDER — FUROSEMIDE 40 MG PO TABS
40.0000 mg | ORAL_TABLET | Freq: Every day | ORAL | Status: DC
  Administered 2017-10-08 – 2017-10-09 (×2): 40 mg via ORAL
  Filled 2017-10-08 (×2): qty 1

## 2017-10-08 MED ORDER — INSULIN ASPART 100 UNIT/ML ~~LOC~~ SOLN
0.0000 [IU] | Freq: Three times a day (TID) | SUBCUTANEOUS | Status: DC
  Administered 2017-10-08 – 2017-10-09 (×2): 2 [IU] via SUBCUTANEOUS
  Administered 2017-10-09: 08:00:00 1 [IU] via SUBCUTANEOUS
  Filled 2017-10-08 (×3): qty 1

## 2017-10-08 MED ORDER — GUAIFENESIN ER 600 MG PO TB12
600.0000 mg | ORAL_TABLET | Freq: Two times a day (BID) | ORAL | Status: DC | PRN
  Administered 2017-10-08: 600 mg via ORAL
  Filled 2017-10-08: qty 1

## 2017-10-08 NOTE — Plan of Care (Signed)
  Problem: Health Behavior/Discharge Planning: Goal: Ability to manage health-related needs will improve Outcome: Progressing   Problem: Clinical Measurements: Goal: Ability to maintain clinical measurements within normal limits will improve Outcome: Progressing Goal: Will remain free from infection Outcome: Progressing   Problem: Activity: Goal: Risk for activity intolerance will decrease Outcome: Progressing   Problem: Nutrition: Goal: Adequate nutrition will be maintained Outcome: Progressing   Problem: Coping: Goal: Level of anxiety will decrease Outcome: Progressing   Problem: Pain Managment: Goal: General experience of comfort will improve Outcome: Progressing   Problem: Safety: Goal: Ability to remain free from injury will improve Outcome: Progressing   Problem: Skin Integrity: Goal: Risk for impaired skin integrity will decrease Outcome: Progressing   

## 2017-10-08 NOTE — Progress Notes (Addendum)
Called to turn off ventilator.  Patient already on room air and sitting in chair beside of bed. Patient in no distress. Patient saturations 100%. Turned off vent and placed circuit on back on vent.  Asked patient if his home ventilator would be brought today and he said yes.

## 2017-10-08 NOTE — Progress Notes (Signed)
[facility] - [place] [facility]   PATIENT NAME: [name]    MR#:  [id number]  DATE OF BIRTH:  Dec 13, 1960  SUBJECTIVE:  CHIEF COMPLAINT:   Chief Complaint  Patient presents with  . Abnormal Lab   Patient's dizziness and weakness are improving.  Eating well.  Increase urine output.  REVIEW OF SYSTEMS:    Review of Systems  Constitutional: Positive for malaise/fatigue. Negative for chills and fever.  HENT: Negative for sore throat.   Eyes: Negative for blurred vision, double vision and pain.  Respiratory: Negative for cough, hemoptysis, shortness of breath and wheezing.   Cardiovascular: Negative for chest pain, palpitations, orthopnea and leg swelling.  Gastrointestinal: Negative for abdominal pain, constipation, diarrhea, heartburn, nausea and vomiting.  Genitourinary: Negative for dysuria and hematuria.  Musculoskeletal: Negative for back pain and joint pain.  Skin: Negative for rash.  Neurological: Negative for sensory change, speech change, focal weakness and headaches.  Endo/Heme/Allergies: Does not bruise/bleed easily.  Psychiatric/Behavioral: Negative for depression. The patient is not nervous/anxious.     DRUG ALLERGIES:   Allergies  Allergen Reactions  . Cephalexin     Other reaction(s): Unknown  . Hctz  [Hydrochlorothiazide]     Other reaction(s): Other (See Comments) hyponatremia  . Hydralazine   . Penicillins Hives    Has patient had a PCN reaction causing immediate rash, facial/tongue/throat swelling, SOB or lightheadedness with hypotension: Yes Has patient had a PCN reaction causing severe rash involving mucus membranes or skin necrosis: Yes Has patient had a PCN reaction that required hospitalization: Unknown Has patient had a PCN reaction occurring within the last 10 years: No If all of the above answers are "NO", then may proceed with Cephalosporin use.   Marland Kitchen. Reserpine   . Zaroxolyn [Metolazone]     Other reaction(s): Unknown     VITALS:  Blood pressure 114/67, pulse (!) 53, temperature 97.6 F (36.4 C), temperature source Oral, resp. rate 20, height 6\' 2"  (1.88 m), weight (!) 143.1 kg (315 lb 6.4 oz), SpO2 100 %.  PHYSICAL EXAMINATION:   Physical Exam  GENERAL:  57 y.o.-year-old patient lying in the bed with no acute distress.  Obese EYES: Pupils equal, round, reactive to light and accommodation. No scleral icterus. Extraocular muscles intact.  HEENT: Head atraumatic, normocephalic. Oropharynx and nasopharynx clear.  NECK:  Supple, no jugular venous distention. No thyroid enlargement, no tenderness.  Trach in place LUNGS: Normal breath sounds bilaterally, no wheezing, rales, rhonchi. No use of accessory muscles of respiration.  CARDIOVASCULAR: S1, S2 normal. No murmurs, rubs, or gallops.  ABDOMEN: Soft, nontender, nondistended. Bowel sounds present. No organomegaly or mass.  EXTREMITIES: No cyanosis, clubbing or edema b/l.    NEUROLOGIC: Cranial nerves II through XII are intact. No focal Motor or sensory deficits b/l.   PSYCHIATRIC: [name].  SKIN: No obvious rash, lesion, or ulcer.   LABORATORY PANEL:   CBC Recent Labs  Lab 10/08/17 0527  WBC 4.6  HGB 10.8*  HCT 31.7*  PLT 282   ------------------------------------------------------------------------------------------------------------------ Chemistries  Recent Labs  Lab 10/07/17 1938 10/08/17 0527  NA 120* 124*  K 4.9 4.3  CL 86* 89*  CO2 23 29  GLUCOSE 107* 128*  BUN 16 16  CREATININE 0.96 0.94  CALCIUM 8.8* 9.0  AST 41  --   ALT 22  --   ALKPHOS 119  --   BILITOT 0.7  --    ------------------------------------------------------------------------------------------------------------------  Cardiac Enzymes No  results for input(s): TROPONINI in the last 168 hours. ------------------------------------------------------------------------------------------------------------------  RADIOLOGY:  Dg Chest  2 View  Result Date: 10/07/2017 CLINICAL DATA:  57 y/o M; hyponatremia with history of hyponatremia. EXAM: CHEST - 2 VIEW COMPARISON:  08/27/2017 chest radiograph FINDINGS: Stable cardiomegaly. No focal consolidation, effusion, or pneumothorax. Tracheostomy tube in cardiac implant noted. Bones are unremarkable. IMPRESSION: Stable cardiomegaly.  No acute pulmonary process identified. Electronically Signed   By: Mitzi Hansen M.D.   On: 10/07/2017 20:32     ASSESSMENT AND PLAN:   *Multifactorial hyponatremia due to pulmonary issues, beer intake, malnutrition. Some improvement today Stop IV fluids.  Continue salt tablets.  Discussed with Dr. Lourdes Sledge. Start Lasix. May need tolvaptan.  But will wait for nephrology input.  *Hypertension.  Continue home medications.  *Diabetes mellitus.  Continue Lantus at a lower dose.  Sliding scale insulin added  *Paroxysmal atrial fibrillation.  Continue amiodarone and metoprolol.  Continue Xarelto.  *Chronic diastolic congestive heart failure. On Lasix  *Alcohol abuse.  7-8 beers a day.  Counseled to quit.  No signs of withdrawal.   All the records are reviewed and case discussed with Care Management/Social Workerr. Management plans discussed with the patient, family and they are in agreement.  CODE STATUS: FULL CODE  DVT Prophylaxis: SCDs  TOTAL TIME TAKING CARE OF THIS PATIENT: 35 minutes.   POSSIBLE D/C IN 1-2 DAYS, DEPENDING ON CLINICAL CONDITION.  Orie Fisherman M.D on 10/08/2017 at 10:53 AM  Between 7am to 6pm - Pager - [phone]  After 6pm go to [email or web address] - password [email or web address]  [facility]  Office  [phone]  CC: Primary care physician; Hillery Aldo, MD  Note: This dictation was prepared with Dragon dictation along with smaller phrase technology. Any transcriptional errors that result from this process are unintentional.

## 2017-10-08 NOTE — Progress Notes (Signed)
Nursing supervisor requested this RT to assess patient for needs regarding his use of mechanical ventilation at home during the night. Mr. [name] has a #8 Shiley Cuffless trach in place at this time which is clean and appears secure and in good condition.  He reports that he uses a Home vent at night when he sleeps which he is familiar with and places on himself and maintains; however he was unable to bring his ventilator with him to the hospital.  Therefore we have provided him a Trilogy vent. To use until he can [facility].  Mr [name] is alert and having no respiratory issues at this time. RT will continue to monitor.

## 2017-10-08 NOTE — Progress Notes (Signed)
[facility]  ROUNDING NOTE   Subjective:  Patient well-known to [place]. He presents again with hyponatremia. He continues to drink approximately 6-7 beers per day. Serum sodium up to 124.  Objective:  Vital signs in last 24 hours:  Temp:  [97.6 F (36.4 C)-98.6 F (37 C)] 97.6 F (36.4 C) (04/17 0844) Pulse Rate:  [50-64] 53 (04/17 0844) Resp:  [18-21] 20 (04/17 0844) BP: (102-117)/(55-68) 114/67 (04/17 0844) SpO2:  [95 %-100 %] 100 % (04/17 0844) FiO2 (%):  [40 %] 40 % (04/17 0400) Weight:  [142 kg (313 lb)-143.1 kg (315 lb 6.4 oz)] 143.1 kg (315 lb 6.4 oz) (04/17 0426)  Weight change:  Filed Weights   10/07/17 1934 10/08/17 0426  Weight: (!) 142 kg (313 lb) (!) 143.1 kg (315 lb 6.4 oz)    Intake/Output: I/O last 3 completed shifts: In: 1000 [IV Piggyback:1000] Out: 400 [Urine:400]   Intake/Output this shift:  Total I/O In: 640 [P.O.:240; Other:400] Out: 1175 [Urine:1175]  Physical Exam: General: No acute distress  Head: Normocephalic, atraumatic. Moist oral mucosal membranes  Eyes: Anicteric  Neck: Tracheostomy in place  Lungs:  Clear to auscultation, normal effort  Heart: S1S2 no rubs  Abdomen:  Soft, nontender, bowel sounds present  Extremities: 1+ peripheral edema.  Neurologic: Awake, alert, following commands  Skin: No lesions       Basic Metabolic Panel: Recent Labs  Lab 10/07/17 1938 10/08/17 0527  NA 120* 124*  K 4.9 4.3  CL 86* 89*  CO2 23 29  GLUCOSE 107* 128*  BUN 16 16  CREATININE 0.96 0.94  CALCIUM 8.8* 9.0    Liver Function Tests: Recent Labs  Lab 10/07/17 1938  AST 41  ALT 22  ALKPHOS 119  BILITOT 0.7  PROT 7.1  ALBUMIN 4.1   No results for input(s): LIPASE, AMYLASE in the last 168 hours. No results for input(s): AMMONIA in the last 168 hours.  CBC: Recent Labs  Lab 10/07/17 1938 10/08/17 0527  WBC 5.4 4.6  HGB 11.8* 10.8*  HCT 34.6* 31.7*  MCV 89.5 90.0  PLT 298 282    Cardiac Enzymes: No results  for input(s): CKTOTAL, CKMB, CKMBINDEX, TROPONINI in the last 168 hours.  BNP: Invalid input(s): POCBNP  CBG: Recent Labs  Lab 10/07/17 2236  GLUCAP 115*    Microbiology: Results for orders placed or performed during the hospital encounter of 08/27/17  MRSA PCR Screening     Status: None   Collection Time: 08/27/17  8:17 PM  Result Value Ref Range Status   MRSA by PCR NEGATIVE NEGATIVE Final    Comment:        The GeneXpert MRSA Assay (FDA approved for NASAL specimens only), is one component of a comprehensive MRSA colonization surveillance program. It is not intended to diagnose MRSA infection nor to guide or monitor treatment for MRSA infections. Performed at [facility], [address] [place], [place] [address]     Coagulation Studies: No results for input(s): LABPROT, INR in the last 72 hours.  Urinalysis: No results for input(s): COLORURINE, LABSPEC, PHURINE, GLUCOSEU, HGBUR, BILIRUBINUR, KETONESUR, PROTEINUR, UROBILINOGEN, NITRITE, LEUKOCYTESUR in the last 72 hours.  Invalid input(s): APPERANCEUR    Imaging: Dg Chest 2 View  Result Date: 10/07/2017 CLINICAL DATA:  57 y/o M; hyponatremia with history of hyponatremia. EXAM: CHEST - 2 VIEW COMPARISON:  08/27/2017 chest radiograph FINDINGS: Stable cardiomegaly. No focal consolidation, effusion, or pneumothorax. Tracheostomy tube in cardiac implant noted. Bones are unremarkable. IMPRESSION: Stable  cardiomegaly.  No acute pulmonary process identified. Electronically Signed   By: Mitzi HansenLance  Furusawa-Stratton M.D.   On: 10/07/2017 20:32     Medications:    . amiodarone  200 mg Oral BID  . atorvastatin  40 mg Oral q1800  . busPIRone  10 mg Oral BID  . enalapril  10 mg Oral BID  . folic acid  1 mg Oral Daily  . furosemide  40 mg Oral Daily  . gabapentin  1,600 mg Oral BID  . insulin aspart  0-9 Units Subcutaneous TID WC  . insulin detemir  15 Units Subcutaneous BID  . lamoTRIgine  150 mg Oral BID  .  metoprolol tartrate  100 mg Oral BID  . multivitamin with minerals  1 tablet Oral Daily  . pantoprazole  40 mg Oral Daily  . rivaroxaban  20 mg Oral Daily  . sodium chloride flush  3 mL Intravenous Q12H  . sodium chloride  1 g Oral BID WC  . thiamine  100 mg Oral Daily   Or  . thiamine  100 mg Intravenous Daily   acetaminophen **OR** acetaminophen, albuterol, ALPRAZolam, LORazepam **OR** LORazepam, ondansetron **OR** ondansetron (ZOFRAN) IV, oxyCODONE-acetaminophen **AND** oxyCODONE, polyethylene glycol  Assessment/ Plan:  57 y.o. male with a PMHx of congestive heart failure with normal ejection fraction, paroxysmal atrial fibrillation, chronic respiratory failure status post tracheostomy placement with nocturnal ventilation, history of pulmonary embolism, obstructive sleep apnea, diabetes mellitus type 2, peripheral neuropathy,elevated liver enzymes, GERD, hypertension, history of pancreatitiswho was admitted to [facility] 3/6/2019for evaluation of chest pain and shortness of breath.  1. Hyponatremia. 2. Chronic respiratory failure with tracheostomy and nocturnal ventilation. 3. Lower extremity edema. 4. ETOH abuse  Plan:  The main issue continues to be beer potomania as he drinks at least 6-7 beers per night.  In addition he appears to have relatively poor solute intake.  For now continue sodium chloride 1 g p.o. twice daily as well as Lasix.  Hold off on tolvaptan for now.  Patient was counseled on this extensively today.   LOS: 1 Loany Neuroth 4/17/201911:36 AM

## 2017-10-09 ENCOUNTER — Ambulatory Visit: Payer: Medicare HMO | Admitting: Nurse Practitioner

## 2017-10-09 LAB — BASIC METABOLIC PANEL
ANION GAP: 7 (ref 5–15)
BUN: 16 mg/dL (ref 6–20)
CALCIUM: 9.6 mg/dL (ref 8.9–10.3)
CO2: 33 mmol/L — AB (ref 22–32)
Chloride: 93 mmol/L — ABNORMAL LOW (ref 101–111)
Creatinine, Ser: 0.83 mg/dL (ref 0.61–1.24)
GFR calc non Af Amer: >60 mL/min (ref >60)
Glucose, Bld: 141 mg/dL — ABNORMAL HIGH (ref 65–99)
POTASSIUM: 4.5 mmol/L (ref 3.5–5.1)
Sodium: 133 mmol/L — ABNORMAL LOW (ref 135–145)

## 2017-10-09 LAB — GLUCOSE, CAPILLARY
Glucose-Capillary: 123 mg/dL — ABNORMAL HIGH (ref 65–99)
Glucose-Capillary: 174 mg/dL — ABNORMAL HIGH (ref 65–99)

## 2017-10-09 MED ORDER — METOPROLOL TARTRATE 50 MG PO TABS
50.0000 mg | ORAL_TABLET | Freq: Two times a day (BID) | ORAL | Status: DC
  Administered 2017-10-09: 50 mg via ORAL
  Filled 2017-10-09: qty 1

## 2017-10-09 MED ORDER — METOPROLOL TARTRATE 75 MG PO TABS: 75.0000 mg | ORAL_TABLET | Freq: Two times a day (BID) | ORAL | 0 refills | Status: DC

## 2017-10-09 MED ORDER — ENALAPRIL MALEATE 2.5 MG PO TABS: 2.5000 mg | ORAL_TABLET | Freq: Every day | ORAL | 0 refills | Status: AC

## 2017-10-09 MED ORDER — FUROSEMIDE 40 MG PO TABS: 40.0000 mg | ORAL_TABLET | Freq: Every day | ORAL | 0 refills | Status: DC

## 2017-10-09 NOTE — Discharge Planning (Signed)
Patient IV and tele removed.  RN assessment and VS revealed stability for DC to home. Discharge papers given, explained and educated. Told of suggested FU appts and both offices stated they would contact patient to set up appts.  Scripts e-scribed to pharm ([facility] - [address], [place].)  Once ready, will be wheeled to front and family transporting home via car.

## 2017-10-09 NOTE — Discharge Instructions (Signed)
Daily fluids < 2 liters  Resume activity as before

## 2017-10-09 NOTE — Progress Notes (Signed)
[facility]  ROUNDING NOTE   Subjective:  Serum sodium significantly improved up to 133. Therefore we continued to suspect that beer ingestion is contributing to his hyponatremia.  Objective:  Vital signs in last 24 hours:  Temp:  [97.8 F (36.6 C)-98.7 F (37.1 C)] 98.7 F (37.1 C) (04/18 0547) Pulse Rate:  [53-87] 53 (04/18 0547) Resp:  [24] 24 (04/17 1242) BP: (105-155)/(55-97) 105/55 (04/18 0547) SpO2:  [98 %-100 %] 98 % (04/18 0547) FiO2 (%):  [40 %] 40 % (04/18 0346) Weight:  [141.6 kg (312 lb 2.7 oz)] 141.6 kg (312 lb 2.7 oz) (04/18 0547)  Weight change: -0.376 kg (-13.3 oz) Filed Weights   10/07/17 1934 10/08/17 0426 10/09/17 0547  Weight: (!) 142 kg (313 lb) (!) 143.1 kg (315 lb 6.4 oz) (!) 141.6 kg (312 lb 2.7 oz)    Intake/Output: I/O last 3 completed shifts: In: 1880 [P.O.:480; Other:400; IV Piggyback:1000] Out: 3650 [Urine:3650]   Intake/Output this shift:  Total I/O In: 120 [P.O.:120] Out: -   Physical Exam: General: No acute distress  Head: Normocephalic, atraumatic. Moist oral mucosal membranes  Eyes: Anicteric  Neck: Tracheostomy in place  Lungs:  Clear to auscultation, normal effort  Heart: S1S2 no rubs  Abdomen:  Soft, nontender, bowel sounds present  Extremities: 1+ peripheral edema.  Neurologic: Awake, alert, following commands  Skin: No lesions       Basic Metabolic Panel: Recent Labs  Lab 10/07/17 1938 10/08/17 0527 10/09/17 0530  NA 120* 124* 133*  K 4.9 4.3 4.5  CL 86* 89* 93*  CO2 23 29 33*  GLUCOSE 107* 128* 141*  BUN 16 16 16   CREATININE 0.96 0.94 0.83  CALCIUM 8.8* 9.0 9.6    Liver Function Tests: Recent Labs  Lab 10/07/17 1938  AST 41  ALT 22  ALKPHOS 119  BILITOT 0.7  PROT 7.1  ALBUMIN 4.1   No results for input(s): LIPASE, AMYLASE in the last 168 hours. No results for input(s): AMMONIA in the last 168 hours.  CBC: Recent Labs  Lab 10/07/17 1938 10/08/17 0527  WBC 5.4 4.6  HGB 11.8*  10.8*  HCT 34.6* 31.7*  MCV 89.5 90.0  PLT 298 282    Cardiac Enzymes: No results for input(s): CKTOTAL, CKMB, CKMBINDEX, TROPONINI in the last 168 hours.  BNP: Invalid input(s): POCBNP  CBG: Recent Labs  Lab 10/08/17 1314 10/08/17 1650 10/08/17 1806 10/08/17 2124 10/09/17 0759  GLUCAP 181* 110* 123* 186* 123*    Microbiology: Results for orders placed or performed during the hospital encounter of 08/27/17  MRSA PCR Screening     Status: None   Collection Time: 08/27/17  8:17 PM  Result Value Ref Range Status   MRSA by PCR NEGATIVE NEGATIVE Final    Comment:        The GeneXpert MRSA Assay (FDA approved for NASAL specimens only), is one component of a comprehensive MRSA colonization surveillance program. It is not intended to diagnose MRSA infection nor to guide or monitor treatment for MRSA infections. Performed at [facility], [address] [place], [place] [address]     Coagulation Studies: No results for input(s): LABPROT, INR in the last 72 hours.  Urinalysis: No results for input(s): COLORURINE, LABSPEC, PHURINE, GLUCOSEU, HGBUR, BILIRUBINUR, KETONESUR, PROTEINUR, UROBILINOGEN, NITRITE, LEUKOCYTESUR in the last 72 hours.  Invalid input(s): APPERANCEUR    Imaging: Dg Chest 2 View  Result Date: 10/07/2017 CLINICAL DATA:  57 y/o M; hyponatremia with history of hyponatremia. EXAM:  CHEST - 2 VIEW COMPARISON:  08/27/2017 chest radiograph FINDINGS: Stable cardiomegaly. No focal consolidation, effusion, or pneumothorax. Tracheostomy tube in cardiac implant noted. Bones are unremarkable. IMPRESSION: Stable cardiomegaly.  No acute pulmonary process identified. Electronically Signed   By: Mitzi Hansen M.D.   On: 10/07/2017 20:32     Medications:    . amiodarone  200 mg Oral BID  . atorvastatin  40 mg Oral q1800  . busPIRone  10 mg Oral BID  . folic acid  1 mg Oral Daily  . furosemide  40 mg Oral Daily  . gabapentin  1,600 mg Oral  BID  . insulin aspart  0-9 Units Subcutaneous TID WC  . insulin detemir  15 Units Subcutaneous BID  . lamoTRIgine  150 mg Oral BID  . metoprolol tartrate  50 mg Oral BID  . multivitamin with minerals  1 tablet Oral Daily  . pantoprazole  40 mg Oral Daily  . rivaroxaban  20 mg Oral Daily  . sodium chloride flush  3 mL Intravenous Q12H  . sodium chloride  1 g Oral BID WC  . thiamine  100 mg Oral Daily   Or  . thiamine  100 mg Intravenous Daily   acetaminophen **OR** acetaminophen, albuterol, ALPRAZolam, guaiFENesin, LORazepam **OR** LORazepam, ondansetron **OR** ondansetron (ZOFRAN) IV, oxyCODONE-acetaminophen **AND** oxyCODONE, polyethylene glycol  Assessment/ Plan:  57 y.o. male with a PMHx of congestive heart failure with normal ejection fraction, paroxysmal atrial fibrillation, chronic respiratory failure status post tracheostomy placement with nocturnal ventilation, history of pulmonary embolism, obstructive sleep apnea, diabetes mellitus type 2, peripheral neuropathy,elevated liver enzymes, GERD, hypertension, history of pancreatitiswho was admitted to [facility] on 3[facility]6/2019for evaluation of chest pain and shortness of breath.  1. Hyponatremia. 2. Chronic respiratory failure with tracheostomy and nocturnal ventilation. 3. Lower extremity edema. 4. ETOH abuse  Plan:  Dramatic improvement noted in his serum sodium up to 133 with just conservative management.  This suggests that his beer intake is affecting his serum sodium quite significantly. Patient drinks 6-7 beers per day.  We counseled the patient extensively today to significantly limit his beer intake.  He verbalized understanding of this.  For now continue sodium chloride 1 g by mouth twice a day as well as Lasix 40 mg daily.   LOS: 2 Kymberlyn Eckford 4/18/201911:18 AM

## 2017-10-10 NOTE — Progress Notes (Signed)
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 ([id number]) Procedures Wound #21 Pre-procedure diagnosis of Wound #21 is a To be determined located on the Right Toe Great . There was a Excisional Skin/Subcutaneous Tissue Debridement with a total area of 0.16 sq cm performed by Bonnell Publicoulter, Duong Haydel, NP. With the following instrument(s): Curette. to remove Viable and Non-Viable tissue/material Material removed includes Subcutaneous Tissue, and Slough, Skin: Epidermis, and Slough after achieving pain control using Lidocaine 4% Topical Solution. No specimens were taken. A time out was conducted at 14:51, prior to the start of the procedure. A Minimum amount of bleeding was controlled with Pressure. The procedure was tolerated well with a pain level of 0 throughout and a pain level of 0 following the procedure. Post Debridement  Measurements: 0.4cm length x 0.4cm width x 0.2cm depth; 0.025cm^3 volume. Character of Wound/Ulcer Post Debridement requires further debridement. Post procedure Diagnosis Wound #21: Same as Pre-Procedure Plan Wound Cleansing: Wound #21 Right Toe Great: Clean wound with Normal Saline. Cleanse wound with mild soap and water Skin Barriers/Peri-Wound Care: Wound #21 Right Toe Great: Moisturizing lotion Primary Wound Dressing: Wound #21 Right Toe Great: Hydrafera Blue Ready Transfer Secondary Dressing: Wound #21 Right Toe Great: Boardered Foam Dressing - or coverlet Dressing Change Frequency: Wound #21 Right Toe Great: Change dressing every other day. Follow-up Appointments: Wound #21 Right Toe Great: Return Appointment in 1 week. Edema Control: Wound #21 Right Toe Great: Kerlix and Coban - Bilateral - unna to anchor Elevate legs to the level of the heart and pump ankles as often as possible Additional Orders / Instructions: Wound #21 Right Toe Great: Increase protein intake. Electronic Signature(s) Signed: 10/02/2017 3:24:19 PM By: Bonnell Publicoulter, Missi Mcmackin Entered By: Bonnell Publicoulter, Renai Lopata on 10/02/2017 15:24:19 Plaugher, [name] EvertsJIM P. ([id number]) -------------------------------------------------------------------------------- ROS/PFSH Details Patient Name: Darl PikesAHILL, Noha P. Date of Service: 10/02/2017 2:00 PM Medical Record Number: [id number] Patient Account Number: [id number] Date of Birth/Sex: [birth year] (57 y.o. M) Treating RN: Phillis HaggisPinkerton, Debi Primary Care Provider: Hillery AldoPATEL, SARAH Other Clinician: Referring Provider: Hillery AldoPATEL, SARAH Treating Provider/Extender: Kathreen Cosieroulter, Betha Shadix Weeks in Treatment: 8 Information Obtained From Patient Wound History Do you currently have one or more open woundso Yes How many open wounds do you currently haveo 5 Approximately how long have you had your woundso several months How have you been treating your wound(s) until nowo  doxycycline for cellulitis Has your wound(s) ever  healed and then re-openedo No Have you had any lab work done in the past montho No Have you tested positive for an antibiotic resistant organism (MRSA, VRE)o Yes Have you tested positive for osteomyelitis (bone infection)o No Have you had any tests for circulation on your legso Yes Who ordered the testo cone 5 years ago Eyes Medical History: Negative for: Cataracts; Glaucoma; Optic Neuritis Ear/Nose/Mouth/Throat Medical History: Negative for: Chronic sinus problems/congestion; Middle ear problems Hematologic/Lymphatic Medical History: Positive for: Lymphedema Negative for: Anemia; Hemophilia; Human Immunodeficiency Virus; Sickle Cell Disease Respiratory Medical History: Positive for: Sleep Apnea - cpap and ventalator for trach Negative for: Aspiration; Asthma; Chronic Obstructive Pulmonary Disease (COPD); Pneumothorax; Tuberculosis Cardiovascular Medical History: Positive for: Arrhythmia - afib; Deep Vein Thrombosis; Hypertension; Myocardial Infarction Negative for: Angina; Congestive Heart Failure; Coronary Artery Disease; Hypotension; Peripheral Arterial Disease; Peripheral Venous Disease; Phlebitis Gastrointestinal Medical History: Negative for: Cirrhosis ; Colitis; Crohnos; Hepatitis A; Hepatitis B; Hepatitis C Endocrine Fukuhara, Yamin P. ([id number]) Medical History: Positive for: Type II Diabetes Time with diabetes: 6 years Treated with: Insulin Blood sugar tested every day: No Blood sugar testing results: Bedtime: 132 Genitourinary Medical History: Negative for: End Stage Renal Disease Immunological Medical History: Negative for: Lupus Erythematosus; Raynaudos; Scleroderma Musculoskeletal Medical History: Negative for: Gout; Rheumatoid Arthritis; Osteoarthritis; Osteomyelitis Neurologic Medical History: Positive for: Neuropathy Negative for: Quadriplegia; Paraplegia; Seizure Disorder Oncologic Medical History: Negative for: Received Chemotherapy; Received  Radiation Psychiatric Medical History: Negative for: Anorexia/bulimia; Confinement Anxiety Immunizations Pneumococcal Vaccine: Received Pneumococcal Vaccination: Yes Implantable Devices Family and Social History Cancer: Yes - Siblings; Diabetes: No; Heart Disease: Yes - Siblings,Father,Mother,Paternal Grandparents,Maternal Grandparents; Hereditary Spherocytosis: No; Hypertension: Yes - Child,Siblings,Father,Paternal Grandparents,Maternal Grandparents; Kidney Disease: No; Lung Disease: No; Seizures: No; Stroke: No; Thyroid Problems: No; Tuberculosis: Yes - Maternal Grandparents; Never smoker; Marital Status - Divorced; Alcohol Use: Never; Drug Use: No History; Caffeine Use: Daily - coffee; Financial Concerns: No; Food, Clothing or Shelter Needs: No; Support System Lacking: No; Transportation Concerns: No; Advanced Directives: No; Patient does not want information on Advanced Directives; Living Will: No Physician Affirmation I have reviewed and agree with the above information. Electronic Signature(s) DEAUNTE, DENTE ([id number]) Signed: 10/02/2017 3:26:23 PM By: Bonnell Public Signed: 10/06/2017 4:22:27 PM By: Alejandro Mulling Entered By: Bonnell Public on 10/02/2017 15:19:43 Koren, [name] ([id number]) -------------------------------------------------------------------------------- SuperBill Details Patient Name: Caradonna, Antoino P. Date of Service: 10/02/2017 Medical Record Number: [id number] Patient Account Number: [id number] Date of Birth/Sex: 1960-09-17 (57 y.o. M) Treating RN: Phillis Haggis Primary Care Provider: Hillery Aldo Other Clinician: Referring Provider: Hillery Aldo Treating Provider/Extender: Kathreen Cosier in Treatment: 8 Diagnosis Coding ICD-10 Codes Code Description [phone] Non-pressure chronic ulcer of other part of right foot with fat layer exposed E11.621 Type 2 diabetes mellitus with foot ulcer I89.0 Lymphedema, not elsewhere classified E11.622 Type 2 diabetes  mellitus with other skin ulcer I50.42 Chronic combined systolic (congestive) and diastolic (congestive) heart failure I48.2 Chronic atrial fibrillation S60.321S Blister (nonthermal) of right thumb, sequela S60.420S Blister (nonthermal) of right index finger, sequela R60.9 Edema, unspecified Facility Procedures CPT4 Code: [id number] Description: 11042 - DEB SUBQ TISSUE 20 SQ CM/< ICD-10 Diagnosis Description L97.512 Non-pressure 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

## 2017-10-10 NOTE — Progress Notes (Signed)
TREVIN, GARTRELL ([id number]) Visit Report for 10/02/2017 Arrival Information Details Patient Name: [name], [name]. Date of Service: 10/02/2017 2:00 PM Medical Record Number: [id number] Patient Account Number: [id number] Date of Birth/Sex: 1961-02-16 (57 y.o. M) Treating RN: [name] Primary Care [name]: [name] Other Clinician: Referring [name]: [name] Treating [name]: [name] in Treatment: 8 Visit Information History Since Last Visit Added or deleted any medications: No Patient Arrived: Ambulatory Any new allergies or adverse reactions: No Arrival Time: 14:24 Had a fall or experienced change in No Accompanied By: self activities of daily living that may affect Transfer Assistance: None risk of falls: Patient Identification Verified: Yes Signs or symptoms of abuse/neglect since last visito No Secondary Verification Process Yes Hospitalized since last visit: No Completed: Implantable device outside of the clinic excluding No Patient Requires Transmission-Based No cellular tissue based products placed in the center Precautions: since last visit: Patient Has Alerts: Yes Has Dressing in Place as Prescribed: Yes Patient Alerts: noncompressible Has Compression in Place as Prescribed: Yes bilateral Pain Present Now: No Electronic Signature(s) Signed: 10/02/2017 3:01:24 PM By: [name] Entered By: [name] on 10/02/2017 14:24:51 [name], [name] ([id number]) -------------------------------------------------------------------------------- Encounter Discharge Information Details Patient Name: [name], [name]. Date of Service: 10/02/2017 2:00 PM Medical Record Number: [id number] Patient Account Number: [id number] Date of Birth/Sex: [birth year] (57 y.o. M) Treating RN: [name] Primary Care [name]: [name] Other Clinician: Referring [name]: [name] Treating Lace Chenevert/Extender: [name] in Treatment:  8 Encounter Discharge Information Items Discharge Pain Level: 0 Discharge Condition: Stable Ambulatory Status: Ambulatory Discharge Destination: Home Transportation: Private Auto Schedule Follow-up Appointment: Yes Medication Reconciliation completed and No provided to Patient/Care [name]: Provided on Clinical Summary of Care: 10/02/2017 Form Type Recipient Paper Patient JC Electronic Signature(s) Signed: 10/03/2017 4:04:12 PM By: [name] Entered By: [name] on 10/02/2017 15:11:47 Read, [name] ([id number]) -------------------------------------------------------------------------------- Lower Extremity Assessment Details Patient Name: [name], [name]. Date of Service: 10/02/2017 2:00 PM Medical Record Number: [id number] Patient Account Number: [id number] Date of Birth/Sex: 04/06/1961 (57 y.o. M) Treating RN: [name] Primary Care [name]: [name] Other Clinician: Referring [name]: [name] Treating [name]/Extender: [name] in Treatment: 8 Edema Assessment Assessed: [Left: No] [Right: No] [Left: Edema] [Right: :] Calf Left: Right: Point of Measurement: 38 cm From Medial Instep 48.5 cm 43.8 cm Ankle Left: Right: Point of Measurement: 14 cm From Medial Instep 34.5 cm 31.1 cm Vascular Assessment Pulses: Dorsalis Pedis Palpable: [Left:Yes] [Right:Yes] Posterior Tibial Extremity colors, hair growth, and conditions: Extremity Color: [Left:Hyperpigmented] [Right:Hyperpigmented] Hair Growth on Extremity: [Left:No] [Right:No] Temperature of Extremity: [Left:Warm] [Right:Warm] Capillary Refill: [Left:< 3 seconds] [Right:< 3 seconds] Toe Nail Assessment Left: Right: Thick: Yes Yes Discolored: Yes Yes Deformed: Yes Yes Improper Length and Hygiene: Yes Yes Electronic Signature(s) Signed: 10/02/2017 3:01:24 PM By: [name] Entered By: [name] on 10/02/2017 14:46:01 [name], [name]  ([id number]) -------------------------------------------------------------------------------- Multi Wound Chart Details Patient Name: [name], [name]. Date of Service: 10/02/2017 2:00 PM Medical Record Number: [id number] Patient Account Number: [id number] Date of Birth/Sex: November 24, 1960 (57 y.o. M) Treating RN: [name] Primary Care Marlita Keil: [name] Other Clinician: Referring [name]: [name] Treating Rene [name]/Extender: [name] in Treatment: 8 Vital Signs Height(in): 74 Pulse(bpm): 71 Weight(lbs): 310 Blood Pressure(mmHg): 115/55 Body Mass Index(BMI): 40 Temperature(F): 98.4 Respiratory Rate 22 (breaths/min): Photos: Wound Location: Right, Anterior Lower Leg Left Knee Right, Medial Lower Leg Wounding Event: Gradually Appeared Trauma Trauma Primary Etiology: Venous Leg Ulcer Trauma, Other Trauma,  Other Comorbid History: Lymphedema, Sleep Apnea, Lymphedema, Sleep Apnea, Lymphedema, Sleep Apnea, Arrhythmia, Deep Vein Arrhythmia, Deep Vein Arrhythmia, Deep Vein Thrombosis, Hypertension, Thrombosis, Hypertension, Thrombosis, Hypertension, Myocardial Infarction, Type II Myocardial Infarction, Type II Myocardial Infarction, Type II Diabetes, Neuropathy Diabetes, Neuropathy Diabetes, Neuropathy Date Acquired: 08/15/2017 08/29/2017 09/12/2017 Weeks of Treatment: 6 3 2  Wound Status: Healed - Epithelialized Healed - Epithelialized Healed - Epithelialized Clustered Wound: No No Yes Measurements L x W x D 0x0x0 0x0x0 0x0x0 (cm) Area (cm) : 0 0 0 Volume (cm) : 0 0 0 % Reduction in Area: 100.00% 100.00% 100.00% % Reduction in Volume: 100.00% 100.00% 100.00% Classification: Partial Thickness Partial Thickness Full Thickness Without Exposed Support Structures Exudate Amount: None Present None Present None Present Wound Margin: Distinct, outline attached Flat and Intact Flat and Intact Granulation Amount: None Present (0%) None Present (0%) None Present  (0%) Necrotic Amount: Large (67-100%) Large (67-100%) Large (67-100%) Necrotic Tissue: Eschar Eschar Eschar Exposed Structures: Fascia: No Fascia: No Fascia: No Fat Layer (Subcutaneous Fat Layer (Subcutaneous Fat Layer (Subcutaneous Tissue) Exposed: No Tissue) Exposed: No Tissue) Exposed: No Tendon: No Tendon: No Tendon: No Olander, Jahquan [name]. ([id number]) Muscle: No Muscle: No Muscle: No Joint: No Joint: No Joint: No Bone: No Bone: No Bone: No Epithelialization: None None None Debridement: N/A N/A N/A Pain Control: N/A N/A N/A Tissue Debrided: N/A N/A N/A Level: N/A N/A N/A Debridement Area (sq cm): N/A N/A N/A Instrument: N/A N/A N/A Bleeding: N/A N/A N/A Hemostasis Achieved: N/A N/A N/A Procedural Pain: N/A N/A N/A Post Procedural Pain: N/A N/A N/A Debridement Treatment N/A N/A N/A Response: Post Debridement N/A N/A N/A Measurements L x W x D (cm) Post Debridement Volume: N/A N/A N/A (cm) Periwound Skin Texture: Excoriation: No Excoriation: No Excoriation: No Induration: No Induration: No Induration: No Callus: No Callus: No Callus: No Crepitus: No Crepitus: No Crepitus: No Rash: No Rash: No Rash: No Scarring: No Scarring: No Scarring: No Periwound Skin Moisture: Maceration: No Maceration: No Maceration: No Dry/Scaly: No Dry/Scaly: No Dry/Scaly: No Periwound Skin Color: Atrophie Blanche: No Atrophie Blanche: No Atrophie Blanche: No Cyanosis: No Cyanosis: No Cyanosis: No Ecchymosis: No Ecchymosis: No Ecchymosis: No Erythema: No Erythema: No Erythema: No Hemosiderin Staining: No Hemosiderin Staining: No Hemosiderin Staining: No Mottled: No Mottled: No Mottled: No Pallor: No Pallor: No Pallor: No Rubor: No Rubor: No Rubor: No Temperature: No Abnormality N/A N/A Tenderness on Palpation: Yes No No Wound Preparation: Ulcer Cleansing: Ulcer Cleansing: Other: soap Ulcer Cleansing: Other: soap Rinsed/Irrigated with Saline, and  water and water Other: soap and water Topical Anesthetic Applied: Topical Anesthetic Applied: Topical Anesthetic Applied: None None None Procedures Performed: N/A N/A N/A Wound Number: [id number] N/A Photos: N/A Wound Location: Left, Anterior Lower Leg Right Toe Great N/A Wounding Event: Trauma Gradually Appeared N/A Tremaine, Rashi Demetrius Charity ([id number]) Primary Etiology: Trauma, Other To be determined N/A Comorbid History: Lymphedema, Sleep Apnea, Lymphedema, Sleep Apnea, N/A Arrhythmia, Deep Vein Arrhythmia, Deep Vein Thrombosis, Hypertension, Thrombosis, Hypertension, Myocardial Infarction, Type II Myocardial Infarction, Type II Diabetes, Neuropathy Diabetes, Neuropathy Date Acquired: 09/12/2017 10/02/2017 N/A Weeks of Treatment: 2 0 N/A Wound Status: Healed - Epithelialized Open N/A Clustered Wound: No No N/A Measurements L x W x D 0x0x0 0.4x0.4x0.1 N/A (cm) Area (cm) : 0 0.126 N/A Volume (cm) : 0 0.013 N/A % Reduction in Area: 100.00% N/A N/A % Reduction in Volume: 100.00% N/A N/A Classification: Full Thickness Without Partial Thickness N/A Exposed Support Structures Exudate Amount: None Present None Present N/A  Wound Margin: Flat and Intact Distinct, outline attached N/A Granulation Amount: None Present (0%) None Present (0%) N/A Necrotic Amount: Large (67-100%) Large (67-100%) N/A Necrotic Tissue: Eschar Adherent Slough N/A Exposed Structures: Fascia: No Fascia: No N/A Fat Layer (Subcutaneous Fat Layer (Subcutaneous Tissue) Exposed: No Tissue) Exposed: No Tendon: No Tendon: No Muscle: No Muscle: No Joint: No Joint: No Bone: No Bone: No Epithelialization: None None N/A Debridement: N/A Debridement - Selective/Open N/A Wound Pre-procedure N/A 14:51 N/A Verification/Time Out Taken: Pain Control: N/A Lidocaine 4% Topical Solution N/A Tissue Debrided: N/A Necrotic/Eschar, Slough N/A Level: N/A Non-Viable Tissue N/A Debridement Area (sq cm): N/A 0.16 N/A Instrument: N/A  Curette N/A Bleeding: N/A Minimum N/A Hemostasis Achieved: N/A Pressure N/A Procedural Pain: N/A 0 N/A Post Procedural Pain: N/A 0 N/A Debridement Treatment N/A Procedure was tolerated well N/A Response: Post Debridement N/A 0.4x0.4x0.2 N/A Measurements L x W x D (cm) Post Debridement Volume: N/A 0.025 N/A (cm) Periwound Skin Texture: Excoriation: No No Abnormalities Noted N/A Induration: No Callus: No Crepitus: No Rash: No Scarring: No Davidoff, Kyon [name]. ([id number]) Periwound Skin Moisture: Maceration: No No Abnormalities Noted N/A Dry/Scaly: No Periwound Skin Color: Atrophie Blanche: No No Abnormalities Noted N/A Cyanosis: No Ecchymosis: No Erythema: No Hemosiderin Staining: No Mottled: No Pallor: No Rubor: No Temperature: N/A No Abnormality N/A Tenderness on Palpation: No Yes N/A Wound Preparation: Ulcer Cleansing: Other: soap Ulcer Cleansing: N/A and water Rinsed/Irrigated with Saline Topical Anesthetic Applied: Topical Anesthetic Applied: None None Procedures Performed: N/A Debridement N/A Treatment Notes Wound #21 (Right Toe Great) 1. Cleansed with: Clean wound with Normal Saline 4. Dressing Applied: Hydrafera Blue Notes coverlet Electronic Signature(s) Signed: 10/02/2017 3:09:26 PM By: Bonnell Public Entered By: Bonnell Public on 10/02/2017 15:09:25 Neises, [name] ([id number]) -------------------------------------------------------------------------------- Multi-Disciplinary Care Plan Details Patient Name: [name], [name]. Date of Service: 10/02/2017 2:00 PM Medical Record Number: [id number] Patient Account Number: [id number] Date of Birth/Sex: December 11, 1960 (57 y.o. M) Treating RN: [name] Primary Care Modell Fendrick: [name] Other Clinician: Referring Tamyka Bezio: [name] Treating Dj Senteno/Extender: [name] in Treatment: 8 Active Inactive Electronic Signature(s) Signed: 10/06/2017 4:22:27 PM By: Alejandro Mulling Entered By: Alejandro Mulling on 10/02/2017 14:52:51 Margraf, [name] ([id number]) -------------------------------------------------------------------------------- Non-Wound Condition Assessment Details Patient Name: [name], [name]. Date of Service: 10/02/2017 2:00 PM Medical Record Number: [id number] Patient Account Number: [id number] Date of Birth/Sex: Feb 28, 1961 (57 y.o. M) Treating RN: [name] Primary Care Kennard Fildes: [name] Other Clinician: Referring Evalette Montrose: [name] Treating Lekha Dancer/Extender: Bonnell Public Weeks in Treatment: 8 Non-Wound Condition: Condition: Lymphedema Location: Leg Side: Bilateral Periwound Skin Texture Texture Color No Abnormalities Noted: Yes No Abnormalities Noted: Yes Moisture No Abnormalities Noted: Yes Electronic Signature(s) Signed: 10/03/2017 3:08:54 PM By: Alejandro Mulling Entered By: Alejandro Mulling on 10/03/2017 15:08:54 Cuellar, [name] ([id number]) -------------------------------------------------------------------------------- Pain Assessment Details Patient Name: [name], [name]. Date of Service: 10/02/2017 2:00 PM Medical Record Number: [id number] Patient Account Number: [id number] Date of Birth/Sex: 04/28/1961 (56 y.o. M) Treating RN: [name] Primary Care Lavaun Greenfield: [name] Other Clinician: Referring Karolyne Timmons: [name] Treating Dayton Sherr/Extender: [name] in Treatment: 8 Active Problems Location of Pain Severity and Description of Pain Patient Has Paino Yes Site Locations Pain Location: Generalized Pain Pain Management and Medication Current Pain Management: Electronic Signature(s) Signed: 10/02/2017 3:01:24 PM By: [name] Entered By: [name] on 10/02/2017 14:25:03 Belisle, [name] ([id number]) -------------------------------------------------------------------------------- Patient/Caregiver Education Details Patient Name: [name]. Date of Service: 10/02/2017 2:00 PM Medical Record Number:  [id number] Patient  Account Number: [id number] Date of Birth/Gender: [birth year] (57 y.o. M) Treating RN: [name] CriglerFlinchum, Cheryl Primary Care Physician: [name] AldoPATEL, SARAH Other Clinician: Referring Physician: Hillery AldoPATEL, SARAH Treating Physician/Extender: [name] Cosieroulter, Leah Weeks in Treatment: 8 Education Assessment Education Provided To: Patient Education Topics Provided Wound/Skin Impairment: Handouts: Caring for Your Ulcer Methods: Explain/Verbal Responses: State content correctly Electronic Signature(s) Signed: 10/03/2017 4:04:12 PM By: [name] CriglerFlinchum, Cheryl Entered By: [name] CriglerFlinchum, Cheryl on 10/02/2017 15:12:30 Wierzbicki, [name] EvertsJIM [name]. ([id number]) -------------------------------------------------------------------------------- Wound Assessment Details Patient Name: [name], [name]. Date of Service: 10/02/2017 2:00 PM Medical Record Number: [id number] Patient Account Number: [id number] Date of Birth/Sex: [birth year] (57 y.o. M) Treating RN: [name] HaggisPinkerton, Debi Primary Care Archie Atilano: [name] AldoPATEL, SARAH Other Clinician: Referring Larnell Granlund: [name] AldoPATEL, SARAH Treating Glendale Wherry/Extender: [name] Cosieroulter, Leah Weeks in Treatment: 8 Wound Status Wound Number: 16 Primary Venous Leg Ulcer Etiology: Wound Location: Right, Anterior Lower Leg Wound Healed - Epithelialized Wounding Event: Gradually Appeared Status: Date Acquired: 08/15/2017 Comorbid Lymphedema, Sleep Apnea, Arrhythmia, Deep Weeks Of Treatment: 6 History: Vein Thrombosis, Hypertension, Myocardial Clustered Wound: No Infarction, Type II Diabetes, Neuropathy Photos Photo Uploaded By: [name] Sitesorthy, Joanna on 10/02/2017 14:53:00 Wound Measurements Length: (cm) 0 % Re Width: (cm) 0 % Re Depth: (cm) 0 Epit Area: (cm) 0 Tun Volume: (cm) 0 Und duction in Area: 100% duction in Volume: 100% helialization: None neling: No ermining: No Wound Description Classification: Partial Thickness Wound Margin: Distinct, outline attached Exudate Amount: None Present Foul Odor After Cleansing:  No Slough/Fibrino Yes Wound Bed Granulation Amount: None Present (0%) Exposed Structure Necrotic Amount: Large (67-100%) Fascia Exposed: No Necrotic Quality: Eschar Fat Layer (Subcutaneous Tissue) Exposed: No Tendon Exposed: No Muscle Exposed: No Joint Exposed: No Bone Exposed: No Periwound Skin Texture Texture Color No Abnormalities Noted: No No Abnormalities Noted: No Scholer, Skylar [name]. ([id number]) Callus: No Atrophie Blanche: No Crepitus: No Cyanosis: No Excoriation: No Ecchymosis: No Induration: No Erythema: No Rash: No Hemosiderin Staining: No Scarring: No Mottled: No Pallor: No Moisture Rubor: No No Abnormalities Noted: No Dry / Scaly: No Temperature / Pain Maceration: No Temperature: No Abnormality Tenderness on Palpation: Yes Wound Preparation Ulcer Cleansing: Rinsed/Irrigated with Saline, Other: soap and water, Topical Anesthetic Applied: None Electronic Signature(s) Signed: 10/06/2017 4:22:27 PM By: Alejandro MullingPinkerton, Debra Entered By: Alejandro MullingPinkerton, Debra on 10/02/2017 14:50:50 Faidley, [name] EvertsJIM [name]. ([id number]) -------------------------------------------------------------------------------- Wound Assessment Details Patient Name: [name], [name]. Date of Service: 10/02/2017 2:00 PM Medical Record Number: [id number] Patient Account Number: [id number] Date of Birth/Sex: [birth year] (57 y.o. M) Treating RN: [name] HaggisPinkerton, Debi Primary Care Odessie Polzin: [name] AldoPATEL, SARAH Other Clinician: Referring Abrar Bilton: [name] AldoPATEL, SARAH Treating Randye Treichler/Extender: [name] Cosieroulter, Leah Weeks in Treatment: 8 Wound Status Wound Number: 18 Primary Trauma, Other Etiology: Wound Location: Left Knee Wound Healed - Epithelialized Wounding Event: Trauma Status: Date Acquired: 08/29/2017 Comorbid Lymphedema, Sleep Apnea, Arrhythmia, Deep Weeks Of Treatment: 3 History: Vein Thrombosis, Hypertension, Myocardial Clustered Wound: No Infarction, Type II Diabetes, Neuropathy Photos Photo Uploaded By: [name] Sitesorthy, Joanna on  10/02/2017 14:53:01 Wound Measurements Length: (cm) 0 % Redu Width: (cm) 0 % Redu Depth: (cm) 0 Epithe Area: (cm) 0 Tunne Volume: (cm) 0 Under ction in Area: 100% ction in Volume: 100% lialization: None ling: No mining: No Wound Description Classification: Partial Thickness Wound Margin: Flat and Intact Exudate Amount: None Present Foul Odor After Cleansing: No Slough/Fibrino No Wound Bed Granulation Amount: None Present (0%) Exposed Structure Necrotic Amount: Large (67-100%) Fascia Exposed: No Necrotic Quality: Eschar Fat Layer (Subcutaneous Tissue) Exposed: No Tendon Exposed: No Muscle Exposed: No Joint Exposed: No Bone Exposed: No Periwound Skin Texture Texture Color No  Abnormalities Noted: No No Abnormalities Noted: No Spiller, Keylon [name]. ([id number]) Callus: No Atrophie Blanche: No Crepitus: No Cyanosis: No Excoriation: No Ecchymosis: No Induration: No Erythema: No Rash: No Hemosiderin Staining: No Scarring: No Mottled: No Pallor: No Moisture Rubor: No No Abnormalities Noted: No Dry / Scaly: No Maceration: No Wound Preparation Ulcer Cleansing: Other: soap and water, Topical Anesthetic Applied: None Electronic Signature(s) Signed: 10/06/2017 4:22:27 PM By: Alejandro Mulling Entered By: Alejandro Mulling on 10/02/2017 14:50:50 Borah, [name] ([id number]) -------------------------------------------------------------------------------- Wound Assessment Details Patient Name: [name], [name]. Date of Service: 10/02/2017 2:00 PM Medical Record Number: [id number] Patient Account Number: [id number] Date of Birth/Sex: [birth year] (57 y.o. M) Treating RN: [name] Primary Care Dvora Buitron: [name] Other Clinician: Referring Tyrah Broers: [name] Treating Tevis Conger/Extender: [name] in Treatment: 8 Wound Status Wound Number: 19 Primary Trauma, Other Etiology: Wound Location: Right, Medial Lower Leg Wound Healed - Epithelialized Wounding  Event: Trauma Status: Date Acquired: 09/12/2017 Comorbid Lymphedema, Sleep Apnea, Arrhythmia, Deep Weeks Of Treatment: 2 History: Vein Thrombosis, Hypertension, Myocardial Clustered Wound: Yes Infarction, Type II Diabetes, Neuropathy Photos Photo Uploaded By: [name] on 10/02/2017 14:55:06 Wound Measurements Length: (cm) 0 Width: (cm) 0 Depth: (cm) 0 Area: (cm) 0 Volume: (cm) 0 % Reduction in Area: 100% % Reduction in Volume: 100% Epithelialization: None Tunneling: No Undermining: No Wound Description Full Thickness Without Exposed Support Classification: Structures Wound Margin: Flat and Intact Exudate None Present Amount: Foul Odor After Cleansing: No Slough/Fibrino No Wound Bed Granulation Amount: None Present (0%) Exposed Structure Necrotic Amount: Large (67-100%) Fascia Exposed: No Necrotic Quality: Eschar Fat Layer (Subcutaneous Tissue) Exposed: No Tendon Exposed: No Muscle Exposed: No Joint Exposed: No Bone Exposed: No Periwound Skin Texture Schwanz, Milt [name]. ([id number]) Texture Color No Abnormalities Noted: No No Abnormalities Noted: No Callus: No Atrophie Blanche: No Crepitus: No Cyanosis: No Excoriation: No Ecchymosis: No Induration: No Erythema: No Rash: No Hemosiderin Staining: No Scarring: No Mottled: No Pallor: No Moisture Rubor: No No Abnormalities Noted: No Dry / Scaly: No Maceration: No Wound Preparation Ulcer Cleansing: Other: soap and water, Topical Anesthetic Applied: None Electronic Signature(s) Signed: 10/06/2017 4:22:27 PM By: Alejandro Mulling Entered By: Alejandro Mulling on 10/02/2017 14:56:30 Govea, [name] ([id number]) -------------------------------------------------------------------------------- Wound Assessment Details Patient Name: [name], [name]. Date of Service: 10/02/2017 2:00 PM Medical Record Number: [id number] Patient Account Number: [id number] Date of Birth/Sex: Dec 04, 1960 (57 y.o. M) Treating RN:  [name] Primary Care Marshella Tello: [name] Other Clinician: Referring Mckenlee Mangham: [name] Treating Kemaya Dorner/Extender: [name] in Treatment: 8 Wound Status Wound Number: 20 Primary Trauma, Other Etiology: Wound Location: Left, Anterior Lower Leg Wound Healed - Epithelialized Wounding Event: Trauma Status: Date Acquired: 09/12/2017 Comorbid Lymphedema, Sleep Apnea, Arrhythmia, Deep Weeks Of Treatment: 2 History: Vein Thrombosis, Hypertension, Myocardial Clustered Wound: No Infarction, Type II Diabetes, Neuropathy Photos Photo Uploaded By: [name] on 10/02/2017 14:55:07 Wound Measurements Length: (cm) 0 Width: (cm) 0 Depth: (cm) 0 Area: (cm) 0 Volume: (cm) 0 % Reduction in Area: 100% % Reduction in Volume: 100% Epithelialization: None Tunneling: No Undermining: No Wound Description Full Thickness Without Exposed Support Classification: Structures Wound Margin: Flat and Intact Exudate None Present Amount: Foul Odor After Cleansing: No Slough/Fibrino No Wound Bed Granulation Amount: None Present (0%) Exposed Structure Necrotic Amount: Large (67-100%) Fascia Exposed: No Necrotic Quality: Eschar Fat Layer (Subcutaneous Tissue) Exposed: No Tendon Exposed: No Muscle Exposed: No Joint Exposed: No Bone Exposed: No Periwound Skin Texture Watling, Saim [name]. ([id number]) Texture Color No Abnormalities Noted:  No No Abnormalities Noted: No Callus: No Atrophie Blanche: No Crepitus: No Cyanosis: No Excoriation: No Ecchymosis: No Induration: No Erythema: No Rash: No Hemosiderin Staining: No Scarring: No Mottled: No Pallor: No Moisture Rubor: No No Abnormalities Noted: No Dry / Scaly: No Maceration: No Wound Preparation Ulcer Cleansing: Other: soap and water, Topical Anesthetic Applied: None Electronic Signature(s) Signed: 10/06/2017 4:22:27 PM By: Alejandro Mulling Entered By: Alejandro Mulling on 10/02/2017 14:50:50 Mussell, [name] ([id number]) -------------------------------------------------------------------------------- Wound Assessment Details Patient Name: [name], [name]. Date of Service: 10/02/2017 2:00 PM Medical Record Number: [id number] Patient Account Number: [id number] Date of Birth/Sex: 1960-09-08 (57 y.o. M) Treating RN: [name] Primary Care Jennier Schissler: [name] Other Clinician: Referring Taygen Acklin: [name] Treating Gamal Todisco/Extender: [name] in Treatment: 8 Wound Status Wound Number: 21 Primary To be determined Etiology: Wound Location: Right Toe Great Wound Open Wounding Event: Gradually Appeared Status: Date Acquired: 10/02/2017 Comorbid Lymphedema, Sleep Apnea, Arrhythmia, Deep Weeks Of Treatment: 0 History: Vein Thrombosis, Hypertension, Myocardial Clustered Wound: No Infarction, Type II Diabetes, Neuropathy Photos Photo Uploaded By: [name] on 10/02/2017 14:58:34 Wound Measurements Length: (cm) 0.4 Width: (cm) 0.4 Depth: (cm) 0.1 Area: (cm) 0.126 Volume: (cm) 0.013 % Reduction in Area: % Reduction in Volume: Epithelialization: None Tunneling: No Undermining: No Wound Description Classification: Partial Thickness Wound Margin: Distinct, outline attached Exudate Amount: None Present Foul Odor After Cleansing: No Slough/Fibrino Yes Wound Bed Granulation Amount: None Present (0%) Exposed Structure Necrotic Amount: Large (67-100%) Fascia Exposed: No Necrotic Quality: Adherent Slough Fat Layer (Subcutaneous Tissue) Exposed: No Tendon Exposed: No Muscle Exposed: No Joint Exposed: No Bone Exposed: No Periwound Skin Texture Texture Color No Abnormalities Noted: No No Abnormalities Noted: No Soules, Thane [name]. ([id number]) Moisture Temperature / Pain No Abnormalities Noted: No Temperature: No Abnormality Tenderness on Palpation: Yes Wound Preparation Ulcer Cleansing: Rinsed/Irrigated with Saline Topical Anesthetic Applied: None Treatment  Notes Wound #21 (Right Toe Great) 1. Cleansed with: Clean wound with Normal Saline 4. Dressing Applied: Hydrafera Blue Notes coverle to toe moisturizer wto lower extremities bilateral wrap with kerlix wrap and coban lightly. Electronic Signature(s) Signed: 10/06/2017 4:22:27 PM By: Alejandro Mulling Entered By: Alejandro Mulling on 10/02/2017 14:52:38 Corsino, [name] ([id number]) -------------------------------------------------------------------------------- Vitals Details Patient Name: [name], [name]. Date of Service: 10/02/2017 2:00 PM Medical Record Number: [id number] Patient Account Number: [id number] Date of Birth/Sex: 07/25/1960 (57 y.o. M) Treating RN: [name] Primary Care Artelia Game: [name] Other Clinician: Referring Mardy Hoppe: [name] Treating Alexianna Nachreiner/Extender: [name] in Treatment: 8 Vital Signs Time Taken: 14:25 Temperature (F): 98.4 Height (in): 74 Pulse (bpm): 71 Weight (lbs): 310 Respiratory Rate (breaths/min): 22 Body Mass Index (BMI): 39.8 Blood Pressure (mmHg): 115/55 Reference Range: 80 - 120 mg / dl Electronic Signature(s) Signed: 10/02/2017 3:01:24 PM By: [name] Entered By: [name] on 10/02/2017 14:27:35

## 2017-10-13 ENCOUNTER — Ambulatory Visit: Payer: Medicare HMO | Admitting: Physician Assistant

## 2017-10-17 ENCOUNTER — Encounter: Payer: Medicare HMO | Admitting: Physician Assistant

## 2017-10-17 DIAGNOSIS — I110 Hypertensive heart disease with heart failure: Secondary | ICD-10-CM | POA: Diagnosis not present

## 2017-10-17 DIAGNOSIS — Z955 Presence of coronary angioplasty implant and graft: Secondary | ICD-10-CM | POA: Diagnosis not present

## 2017-10-17 DIAGNOSIS — Z950 Presence of cardiac pacemaker: Secondary | ICD-10-CM | POA: Diagnosis not present

## 2017-10-17 DIAGNOSIS — J449 Chronic obstructive pulmonary disease, unspecified: Secondary | ICD-10-CM | POA: Diagnosis not present

## 2017-10-17 DIAGNOSIS — I5042 Chronic combined systolic (congestive) and diastolic (congestive) heart failure: Secondary | ICD-10-CM | POA: Diagnosis not present

## 2017-10-17 DIAGNOSIS — E1140 Type 2 diabetes mellitus with diabetic neuropathy, unspecified: Secondary | ICD-10-CM | POA: Diagnosis not present

## 2017-10-17 DIAGNOSIS — I482 Chronic atrial fibrillation: Secondary | ICD-10-CM | POA: Diagnosis not present

## 2017-10-17 DIAGNOSIS — I252 Old myocardial infarction: Secondary | ICD-10-CM | POA: Diagnosis not present

## 2017-10-17 DIAGNOSIS — Z86711 Personal history of pulmonary embolism: Secondary | ICD-10-CM | POA: Diagnosis not present

## 2017-10-17 DIAGNOSIS — L97821 Non-pressure chronic ulcer of other part of left lower leg limited to breakdown of skin: Secondary | ICD-10-CM | POA: Diagnosis present

## 2017-10-17 DIAGNOSIS — L98492 Non-pressure chronic ulcer of skin of other sites with fat layer exposed: Secondary | ICD-10-CM | POA: Diagnosis not present

## 2017-10-17 DIAGNOSIS — L97811 Non-pressure chronic ulcer of other part of right lower leg limited to breakdown of skin: Secondary | ICD-10-CM | POA: Diagnosis not present

## 2017-10-17 DIAGNOSIS — L97512 Non-pressure chronic ulcer of other part of right foot with fat layer exposed: Secondary | ICD-10-CM | POA: Diagnosis not present

## 2017-10-17 DIAGNOSIS — G4730 Sleep apnea, unspecified: Secondary | ICD-10-CM | POA: Diagnosis not present

## 2017-10-17 DIAGNOSIS — S90822A Blister (nonthermal), left foot, initial encounter: Secondary | ICD-10-CM | POA: Diagnosis not present

## 2017-10-17 DIAGNOSIS — S60420A Blister (nonthermal) of right index finger, initial encounter: Secondary | ICD-10-CM | POA: Diagnosis not present

## 2017-10-17 DIAGNOSIS — I890 Lymphedema, not elsewhere classified: Secondary | ICD-10-CM | POA: Diagnosis not present

## 2017-10-17 DIAGNOSIS — E11622 Type 2 diabetes mellitus with other skin ulcer: Secondary | ICD-10-CM | POA: Diagnosis not present

## 2017-10-17 DIAGNOSIS — Y92480 Sidewalk as the place of occurrence of the external cause: Secondary | ICD-10-CM | POA: Diagnosis not present

## 2017-10-17 DIAGNOSIS — E11621 Type 2 diabetes mellitus with foot ulcer: Secondary | ICD-10-CM | POA: Diagnosis not present

## 2017-10-17 DIAGNOSIS — F329 Major depressive disorder, single episode, unspecified: Secondary | ICD-10-CM | POA: Diagnosis not present

## 2017-10-21 NOTE — Progress Notes (Signed)
[name], [name] ([id number]) Visit Report for 10/17/2017 Arrival Information Details Patient Name: [name], [name]. Date of Service: 10/17/2017 11:15 AM Medical Record Number: [id number] Patient Account Number: [id number] Date of Birth/Sex: 07-18-1960 (57 y.o. M) Treating RN: [name] Primary Care [name]: [name] Other Clinician: Referring [name]: [name] Treating [name]/Extender: [name], [name] in Treatment: 10 Visit Information History Since Last Visit All ordered tests and consults were completed: No Patient Arrived: Cane Added or deleted any medications: No Arrival Time: 11:22 Any new allergies or adverse reactions: No Accompanied By: self Had a fall or experienced change in No Transfer Assistance: None activities of daily living that may affect Patient Requires Transmission-Based No risk of falls: Precautions: Signs or symptoms of abuse/neglect since last visito No Patient Has Alerts: Yes Hospitalized since last visit: No Patient Alerts: noncompressible Implantable device outside of the clinic excluding No bilateral cellular tissue based products placed in the center since last visit: Pain Present Now: No Electronic Signature(s) Signed: 10/17/2017 4:07:12 PM By: [name] Entered By: [name] on 10/17/2017 11:24:47 Grammatico, [name] ([id number]) -------------------------------------------------------------------------------- Clinic Level of Care Assessment Details Patient Name: [name], [name] Date of Service: 10/17/2017 11:15 AM Medical Record Number: [id number] Patient Account Number: [id number] Date of Birth/Sex: [birth year] (57 y.o. M) Treating RN: [name] Primary Care Angellynn Kimberlin: [name] Other Clinician: Referring [name]: [name] Treating [name]/Extender: [name], [name] in Treatment: 10 Clinic Level of Care Assessment Items TOOL 4 Quantity Score  - Use when only an EandM is performed on FOLLOW-UP  visit 0 ASSESSMENTS - Nursing Assessment / Reassessment X - Reassessment of Co-morbidities (includes updates in patient status) 1 10 X- 1 5 Reassessment of Adherence to Treatment Plan ASSESSMENTS - Wound and Skin Assessment / Reassessment  - Simple Wound Assessment / Reassessment - one wound 0 X- 4 5 Complex Wound Assessment / Reassessment - multiple wounds  - 0 Dermatologic / Skin Assessment (not related to wound area) ASSESSMENTS - Focused Assessment X - Circumferential Edema Measurements - multi extremities 2 5  - 0 Nutritional Assessment / Counseling / Intervention X- 1 5 Lower Extremity Assessment (monofilament, tuning fork, pulses)  - 0 Peripheral Arterial Disease Assessment (using hand held doppler) ASSESSMENTS - Ostomy and/or Continence Assessment and Care  - Incontinence Assessment and Management 0  - 0 Ostomy Care Assessment and Management (repouching, etc.) PROCESS - Coordination of Care X - Simple Patient / Family Education for ongoing care 1 15  - 0 Complex (extensive) Patient / Family Education for ongoing care  - 0 Staff obtains Chiropractor, Records, Test Results / Process Orders  - 0 Staff telephones HHA, Nursing Homes / Clarify orders / etc  - 0 Routine Transfer to another Facility (non-emergent condition)  - 0 Routine Hospital Admission (non-emergent condition)  - 0 New Admissions / Manufacturing engineer / Ordering NPWT, Apligraf, etc.  - 0 Emergency Hospital Admission (emergent condition) X- 1 10 Simple Discharge Coordination [name], [name] ([id number])  - 0 Complex (extensive) Discharge Coordination PROCESS - Special Needs  - Pediatric / Minor Patient Management 0  - 0 Isolation Patient Management  - 0 Hearing / Language / Visual special needs  - 0 Assessment of Community assistance (transportation, D/C planning, etc.)  - 0 Additional assistance / Altered mentation  - 0 Support Surface(s) Assessment  (bed, cushion, seat, etc.) INTERVENTIONS - Wound Cleansing / Measurement  - Simple Wound Cleansing - one wound 0 X- 4 5 Complex Wound Cleansing - multiple wounds X-  1 5 Wound Imaging (photographs - any number of wounds)  - 0 Wound Tracing (instead of photographs)  - 0 Simple Wound Measurement - one wound X- 4 5 Complex Wound Measurement - multiple wounds INTERVENTIONS - Wound Dressings  - Small Wound Dressing one or multiple wounds 0  - 0 Medium Wound Dressing one or multiple wounds X- 2 20 Large Wound Dressing one or multiple wounds  - 0 Application of Medications - topical  - 0 Application of Medications - injection INTERVENTIONS - Miscellaneous  - External ear exam 0  - 0 Specimen Collection (cultures, biopsies, blood, body fluids, etc.)  - 0 Specimen(s) / Culture(s) sent or taken to Lab for analysis  - 0 Patient Transfer (multiple staff / Nurse, adult / Similar devices)  - 0 Simple Staple / Suture removal (25 or less)  - 0 Complex Staple / Suture removal (26 or more)  - 0 Hypo / Hyperglycemic Management (close monitor of Blood Glucose)  - 0 Ankle / Brachial Index (ABI) - do not check if billed separately X- 1 5 Vital Signs [name], [name] ([id number]) Has the patient been seen at the hospital within the last three years: Yes Total Score: 165 Level Of Care: New/Established - Level 5 Electronic Signature(s) Signed: 10/17/2017 4:37:07 PM By: [name] Entered By: [name] on 10/17/2017 13:19:26 [name], [name] ([id number]) -------------------------------------------------------------------------------- Encounter Discharge Information Details Patient Name: [name], Coltrane P. Date of Service: 10/17/2017 11:15 AM Medical Record Number: [id number] Patient Account Number: [id number] Date of Birth/Sex: 07-Jul-1960 (57 y.o. M) Treating RN: Phillis Haggis Primary Care Rc Amison: [name] Other Clinician: Referring Rachella Basden: [name] Treating Masey Scheiber/Extender: [name], [name] in Treatment: 10 Encounter Discharge Information Items Discharge Pain Level: 0 Discharge Condition: Stable Ambulatory Status: Ambulatory Discharge Destination: Home Private Transportation: Auto Accompanied By: girlfriend Schedule Follow-up Appointment: Yes Medication Reconciliation completed and provided No to Patient/Care Richardine Peppers: Clinical Summary of Care: Electronic Signature(s) Signed: 10/17/2017 1:03:23 PM By: Alejandro Mulling Entered By: Alejandro Mulling on 10/17/2017 13:03:23 [name], [name] ([id number]) -------------------------------------------------------------------------------- Lower Extremity Assessment Details Patient Name: [name], [name] Date of Service: 10/17/2017 11:15 AM Medical Record Number: [id number] Patient Account Number: [id number] Date of Birth/Sex: 05/12/1961 (57 y.o. M) Treating RN: [name] Primary Care Gillie Fleites: [name] Other Clinician: Referring Tayten Heber: [name] Treating Lucienne Sawyers/Extender: [name], [name] in Treatment: 10 Edema Assessment Assessed: [Left: No] [Right: No] Edema: [Left: Yes] [Right: Yes] Calf Left: Right: Point of Measurement: 38 cm From Medial Instep 51.5 cm 47 cm Ankle Left: Right: Point of Measurement: 14 cm From Medial Instep 33.9 cm 30.4 cm Vascular Assessment Claudication: Claudication Assessment [Left:None] [Right:None] Pulses: Dorsalis Pedis Palpable: [Left:No] [Right:No] Doppler Audible: [Left:Yes] [Right:Yes] Posterior Tibial Extremity colors, hair growth, and conditions: Extremity Color: [Left:Red] [Right:Red] Hair Growth on Extremity: [Left:No] [Right:No] Temperature of Extremity: [Left:Warm] [Right:Warm] Capillary Refill: [Left:< 3 seconds] [Right:< 3 seconds] Toe Nail Assessment Left: Right: Thick: Yes Yes Discolored: Yes Yes Deformed: Yes Yes Improper Length and Hygiene: Yes Yes Electronic Signature(s) Signed: 10/17/2017  4:07:12 PM By: [name] Entered By: [name] on 10/17/2017 11:47:11 Oppedisano, [name] ([id number]) -------------------------------------------------------------------------------- Multi Wound Chart Details Patient Name: [name], [name] Date of Service: 10/17/2017 11:15 AM Medical Record Number: [id number] Patient Account Number: [id number] Date of Birth/Sex: 09/22/1960 (57 y.o. M) Treating RN: [name] Primary Care Copper Kirtley: [name] Other Clinician: Referring Kristle Wesch: [name] Treating Reeya Bound/Extender: [name], [name] in Treatment: 10 Vital Signs Height(in): 74 Pulse(bpm): 63 Weight(lbs): 310 Blood  Pressure(mmHg): 144/99 Body Mass Index(BMI): 40 Temperature(F): 98.4 Respiratory Rate 20 (breaths/min): Photos: [21:No Photos] [22:No Photos] [23:No Photos] Wound Location: [21:Right Toe Great] [22:Left Lower Leg - Midline] [23:Right Lower Leg - Midline] Wounding Event: [21:Gradually Appeared] [22:Gradually Appeared] [23:Gradually Appeared] Primary Etiology: [21:To be determined] [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] Comorbid History: [21:Lymphedema, Sleep Apnea, Lymphedema, Sleep Apnea, Arrhythmia, Deep Vein Thrombosis, Hypertension, Myocardial Infarction, Type II Myocardial Infarction, Type II Diabetes, Neuropathy] [22:Arrhythmia, Deep Vein Thrombosis,  Hypertension, Diabetes, Neuropathy] [23:Lymphedema, Sleep Apnea, Arrhythmia, Deep Vein Thrombosis, Hypertension, Myocardial Infarction, Type II Diabetes, Neuropathy] Date Acquired: [21:10/02/2017] [22:10/17/2017] [23:10/17/2017] [name] of Treatment: [21:2] [22:0] [23:0] Wound Status: [21:Open] [22:Open] [23:Open] Clustered Wound: [21:No] [22:No] [23:Yes] Measurements L x W x D [21:0.3x0.3x0.1] [22:1.9x0.6x0.1] [23:6.2x2x0.1] (cm) Area (cm) : [21:0.071] [22:0.895] [23:9.739] Volume (cm) : [21:0.007] [22:0.09] [23:0.974] % Reduction in Area:  [21:43.70%] [22:0.00%] [23:0.00%] % Reduction in Volume: [21:46.20%] [22:0.00%] [23:0.00%] Classification: [21:Partial Thickness] [22:Grade 1] [23:Grade 1] Exudate Amount: [21:None Present] [22:Large] [23:Large] Exudate Type: [21:N/A] [22:Serous] [23:Serous] Exudate Color: [21:N/A] [22:amber] [23:amber] Wound Margin: [21:Distinct, outline attached] [22:Flat and Intact] [23:Flat and Intact] Granulation Amount: [21:None Present (0%)] [22:Large (67-100%)] [23:Large (67-100%)] Granulation Quality: [21:N/A] [22:Red] [23:Red] Necrotic Amount: [21:Large (67-100%)] [22:None Present (0%)] [23:None Present (0%)] Necrotic Tissue: [21:Eschar] [22:N/A] [23:N/A] Exposed Structures: [21:Fascia: No Fat Layer (Subcutaneous Tissue) Exposed: No Tendon: No Muscle: No Joint: No Bone: No] [22:Fascia: No Fat Layer (Subcutaneous Tissue) Exposed: No Tendon: No Muscle: No Joint: No Bone: No] [23:Fascia: No Fat Layer (Subcutaneous Tissue)  Exposed: No Tendon: No Muscle: No Joint: No Bone: No Limited to Skin Breakdown] Epithelialization: Large (67-100%) None None Periwound Skin Texture: Excoriation: No Excoriation: No Excoriation: No Induration: No Induration: No Induration: No Callus: No Callus: No Callus: No Crepitus: No Crepitus: No Crepitus: No Rash: No Rash: No Rash: No Scarring: No Scarring: No Scarring: No Periwound Skin Moisture: Maceration: No Maceration: No Maceration: No Dry/Scaly: No Dry/Scaly: No Dry/Scaly: No Periwound Skin Color: Atrophie Blanche: No Erythema: Yes Atrophie Blanche: No Cyanosis: No Atrophie Blanche: No Cyanosis: No Ecchymosis: No Cyanosis: No Ecchymosis: No Erythema: No Ecchymosis: No Erythema: No Hemosiderin Staining: No Hemosiderin Staining: No Hemosiderin Staining: No Mottled: No Mottled: No Mottled: No Pallor: No Pallor: No Pallor: No Rubor: No Rubor: No Rubor: No Erythema Location: N/A Circumferential N/A Temperature: No Abnormality No  Abnormality No Abnormality Tenderness on Palpation: No No Yes Wound Preparation: Ulcer Cleansing: Ulcer Cleansing: Ulcer Cleansing: Rinsed/Irrigated with Saline Rinsed/Irrigated with Saline Rinsed/Irrigated with Saline Topical Anesthetic Applied: Topical Anesthetic Applied: Topical Anesthetic Applied: None, Other: lidocaine 4% Other: lidocaine 4% None Assessment Notes: N/A N/A N/A Wound Number: 24 N/A N/A Photos: No Photos N/A N/A Wound Location: Right Lower Leg - Posterior N/A N/A Wounding Event: Gradually Appeared N/A N/A Primary Etiology: Diabetic Wound/Ulcer of the N/A N/A Lower Extremity Comorbid History: Lymphedema, Sleep Apnea, N/A N/A Arrhythmia, Deep Vein Thrombosis, Hypertension, Myocardial Infarction, Type II Diabetes, Neuropathy Date Acquired: 10/17/2017 N/A N/A [name] of Treatment: 0 N/A N/A Wound Status: Open N/A N/A Clustered Wound: Yes N/A N/A Measurements L x W x D 3.9x4x0.1 N/A N/A (cm) Area (cm) : 12.252 N/A N/A Volume (cm) : 1.225 N/A N/A % Reduction in Area: 0.00% N/A N/A % Reduction in Volume: 0.00% N/A N/A Classification: Grade 1 N/A N/A Exudate Amount: Large N/A N/A Exudate Type: Serous N/A N/A Exudate Color: amber N/A N/A Wound Margin: Flat and Intact N/A N/A Granulation Amount: Large (67-100%) N/A N/A Granulation Quality: Red  N/A N/A Necrotic Amount: None Present (0%) N/A N/A Necrotic Tissue: N/A N/A N/A Dezarn, Alexus P. ([id number]) Exposed Structures: Fascia: No N/A N/A Fat Layer (Subcutaneous Tissue) Exposed: No Tendon: No Muscle: No Joint: No Bone: No Limited to Skin Breakdown Epithelialization: N/A N/A N/A Periwound Skin Texture: Excoriation: No N/A N/A Induration: No Callus: No Crepitus: No Rash: No Scarring: No Periwound Skin Moisture: Maceration: No N/A N/A Dry/Scaly: No Periwound Skin Color: Erythema: Yes N/A N/A Atrophie Blanche: No Cyanosis: No Ecchymosis: No Hemosiderin Staining: No Mottled: No Pallor:  No Rubor: No Erythema Location: Circumferential N/A N/A Temperature: No Abnormality N/A N/A Tenderness on Palpation: Yes N/A N/A Wound Preparation: Ulcer Cleansing: N/A N/A Rinsed/Irrigated with Saline Topical Anesthetic Applied: None Assessment Notes: lots of weeping drainage noted N/A N/A Treatment Notes Electronic Signature(s) Signed: 10/17/2017 4:37:07 PM By: [name] Entered By: [name] on 10/17/2017 11:52:08 Gesner, [name] ([id number]) -------------------------------------------------------------------------------- Multi-Disciplinary Care Plan Details Patient Name: [name] Clark. Date of Service: 10/17/2017 11:15 AM Medical Record Number: [id number] Patient Account Number: [id number] Date of Birth/Sex: August 13, 1960 (57 y.o. M) Treating RN: [name] Primary Care Amorah Sebring: [name] Other Clinician: Referring Oather Muilenburg: [name] Treating Cali Cuartas/Extender: [name], [name] in Treatment: 10 Active Inactive Electronic Signature(s) Signed: 10/17/2017 4:37:07 PM By: [name] Entered By: [name] on 10/17/2017 11:51:59 [name], [name] ([id number]) -------------------------------------------------------------------------------- Pain Assessment Details Patient Name: [name], [name] Date of Service: 10/17/2017 11:15 AM Medical Record Number: [id number] Patient Account Number: [id number] Date of Birth/Sex: 01-18-1961 (57 y.o. M) Treating RN: [name] Primary Care Shree Espey: [name] Other Clinician: Referring Gabi Mcfate: [name] Treating Nesha Counihan/Extender: [name], [name] in Treatment: 10 Active Problems Location of Pain Severity and Description of Pain Patient Has Paino No Site Locations Pain Management and Medication Current Pain Management: Electronic Signature(s) Signed: 10/17/2017 4:07:12 PM By: [name] Entered By: [name] on 10/17/2017 11:25:07 [name], [name]  ([id number]) -------------------------------------------------------------------------------- Patient/Caregiver Education Details Patient Name: [name] Clark. Date of Service: 10/17/2017 11:15 AM Medical Record Number: [id number] Patient Account Number: [id number] Date of Birth/Gender: December 05, 1960 (57 y.o. M) Treating RN: Phillis Haggis Primary Care Physician: [name] Other Clinician: Referring Physician: Hillery [name] Treating Physician/Extender: Skeet Simmer in Treatment: 10 Education Assessment Education Provided To: Patient Education Topics Provided Wound/Skin Impairment: Handouts: Caring for Your Ulcer, Other: do not get wraps wet Methods: Demonstration, Explain/Verbal Responses: State content correctly Electronic Signature(s) Signed: 10/17/2017 4:29:16 PM By: Alejandro Mulling Entered By: Alejandro Mulling on 10/17/2017 13:04:24 [name], [name] ([id number]) -------------------------------------------------------------------------------- Wound Assessment Details Patient Name: Dial, Meyer P. Date of Service: 10/17/2017 11:15 AM Medical Record Number: [id number] Patient Account Number: [id number] Date of Birth/Sex: 1960-10-14 (57 y.o. M) Treating RN: [name] Primary Care Ivo Moga: [name] Other Clinician: Referring Sufyan Meidinger: [name] Treating Helton Oleson/Extender: [name], [name] in Treatment: 10 Wound Status Wound Number: 21 Primary To be determined Etiology: Wound Location: Right Toe Great Wound Open Wounding Event: Gradually Appeared Status: Date Acquired: 10/02/2017 Comorbid Lymphedema, Sleep Apnea, Arrhythmia, Deep [name] Of Treatment: 2 History: Vein Thrombosis, Hypertension, Myocardial Clustered Wound: No Infarction, Type II Diabetes, Neuropathy Photos Photo Uploaded By: Elliot Gurney, BSN, RN, CWS, Kim on 10/17/2017 15:42:50 Wound Measurements Length: (cm) 0.3 Width: (cm) 0.3 Depth: (cm) 0.1 Area: (cm) 0.071 Volume: (cm) 0.007 %  Reduction in Area: 43.7% % Reduction in Volume: 46.2% Epithelialization: Large (67-100%) Tunneling: No Undermining: No Wound Description Classification: Partial Thickness Wound Margin: Distinct, outline attached Exudate Amount: None Present Foul Odor After  Cleansing: No Slough/Fibrino Yes Wound Bed Granulation Amount: None Present (0%) Exposed Structure Necrotic Amount: Large (67-100%) Fascia Exposed: No Necrotic Quality: Eschar Fat Layer (Subcutaneous Tissue) Exposed: No Tendon Exposed: No Muscle Exposed: No Joint Exposed: No Bone Exposed: No Periwound Skin Texture Texture Color No Abnormalities Noted: No No Abnormalities Noted: No Snead, Alven P. ([id number]) Callus: No Atrophie Blanche: No Crepitus: No Cyanosis: No Excoriation: No Ecchymosis: No Induration: No Erythema: No Rash: No Hemosiderin Staining: No Scarring: No Mottled: No Pallor: No Moisture Rubor: No No Abnormalities Noted: No Dry / Scaly: No Temperature / Pain Maceration: No Temperature: No Abnormality Wound Preparation Ulcer Cleansing: Rinsed/Irrigated with Saline Topical Anesthetic Applied: None, Other: lidocaine 4%, Electronic Signature(s) Signed: 10/17/2017 4:07:12 PM By: [name] Entered By: [name] on 10/17/2017 11:31:16 Oravec, [name] ([id number]) -------------------------------------------------------------------------------- Wound Assessment Details Patient Name: Feldner, Samir P. Date of Service: 10/17/2017 11:15 AM Medical Record Number: [id number] Patient Account Number: [id number] Date of Birth/Sex: Jan 26, 1961 (57 y.o. M) Treating RN: [name] Primary Care Greycen Felter: [name] Other Clinician: Referring Livia Tarr: [name] Treating Clessie Karras/Extender: [name], [name] in Treatment: 10 Wound Status Wound Number: 22 Primary Diabetic Wound/Ulcer of the Lower Extremity Etiology: Wound Location: Left Lower Leg - Midline Wound Open Wounding Event:  Gradually Appeared Status: Date Acquired: 10/17/2017 Comorbid Lymphedema, Sleep Apnea, Arrhythmia, Deep [name] Of Treatment: 0 History: Vein Thrombosis, Hypertension, Myocardial Clustered Wound: No Infarction, Type II Diabetes, Neuropathy Photos Photo Uploaded By: Elliot Gurney, BSN, RN, CWS, Kim on 10/17/2017 15:42:51 Wound Measurements Length: (cm) 1.9 Width: (cm) 0.6 Depth: (cm) 0.1 Area: (cm) 0.895 Volume: (cm) 0.09 % Reduction in Area: 0% % Reduction in Volume: 0% Epithelialization: None Tunneling: No Undermining: No Wound Description Classification: Grade 1 Foul Odor Wound Margin: Flat and Intact Slough/Fi Exudate Amount: Large Exudate Type: Serous Exudate Color: amber After Cleansing: No brino No Wound Bed Granulation Amount: Large (67-100%) Exposed Structure Granulation Quality: Red Fascia Exposed: No Necrotic Amount: None Present (0%) Fat Layer (Subcutaneous Tissue) Exposed: No Tendon Exposed: No Muscle Exposed: No Joint Exposed: No Bone Exposed: No Periwound Skin Texture Nichter, Aureliano P. ([id number]) Texture Color No Abnormalities Noted: No No Abnormalities Noted: No Callus: No Atrophie Blanche: No Crepitus: No Cyanosis: No Excoriation: No Ecchymosis: No Induration: No Erythema: Yes Rash: No Erythema Location: Circumferential Scarring: No Hemosiderin Staining: No Mottled: No Moisture Pallor: No No Abnormalities Noted: No Rubor: No Dry / Scaly: No Maceration: No Temperature / Pain Temperature: No Abnormality Wound Preparation Ulcer Cleansing: Rinsed/Irrigated with Saline Topical Anesthetic Applied: Other: lidocaine 4%, Treatment Notes Wound #22 (Left, Midline Lower Leg) 1. Cleansed with: Clean wound with Normal Saline 4. Dressing Applied: Other dressing (specify in notes) 5. Secondary Dressing Applied ABD Pad Kerlix/Conform 7. Secured with Tape Notes xtrasorb, kerlix, coban, unna to Ecologist) Signed: 10/17/2017  4:07:12 PM By: [name] Entered By: [name] on 10/17/2017 11:44:21 Zenk, [name] ([id number]) -------------------------------------------------------------------------------- Wound Assessment Details Patient Name: Delange, Ruble P. Date of Service: 10/17/2017 11:15 AM Medical Record Number: [id number] Patient Account Number: [id number] Date of Birth/Sex: 10-31-1960 (57 y.o. M) Treating RN: [name] Primary Care Irlanda Croghan: [name] Other Clinician: Referring Yazlin Ekblad: [name] Treating Joanette Silveria/Extender: [name], [name] in Treatment: 10 Wound Status Wound Number: 23 Primary Diabetic Wound/Ulcer of the Lower Extremity Etiology: Wound Location: Right Lower Leg - Midline Wound Open Wounding Event: Gradually Appeared Status: Date Acquired: 10/17/2017 Comorbid Lymphedema, Sleep Apnea, Arrhythmia, Deep [name] Of Treatment: 0 History: Vein Thrombosis, Hypertension, Myocardial Clustered Wound:  Yes Infarction, Type II Diabetes, Neuropathy Photos Photo Uploaded By: Elliot Gurney, BSN, RN, CWS, Kim on 10/17/2017 15:47:24 Wound Measurements Length: (cm) 6.2 Width: (cm) 2 Depth: (cm) 0.1 Area: (cm) 9.739 Volume: (cm) 0.974 % Reduction in Area: 0% % Reduction in Volume: 0% Epithelialization: None Tunneling: No Undermining: No Wound Description Classification: Grade 1 Foul Odor Wound Margin: Flat and Intact Slough/Fi Exudate Amount: Large Exudate Type: Serous Exudate Color: amber After Cleansing: No brino No Wound Bed Granulation Amount: Large (67-100%) Exposed Structure Granulation Quality: Red Fascia Exposed: No Necrotic Amount: None Present (0%) Fat Layer (Subcutaneous Tissue) Exposed: No Tendon Exposed: No Muscle Exposed: No Joint Exposed: No Bone Exposed: No Limited to Skin Breakdown Vandenbos, Trysten P. ([id number]) Periwound Skin Texture Texture Color No Abnormalities Noted: No No Abnormalities Noted: No Callus: No Atrophie Blanche:  No Crepitus: No Cyanosis: No Excoriation: No Ecchymosis: No Induration: No Erythema: No Rash: No Hemosiderin Staining: No Scarring: No Mottled: No Pallor: No Moisture Rubor: No No Abnormalities Noted: No Dry / Scaly: No Temperature / Pain Maceration: No Temperature: No Abnormality Tenderness on Palpation: Yes Wound Preparation Ulcer Cleansing: Rinsed/Irrigated with Saline Topical Anesthetic Applied: None Treatment Notes Wound #23 (Right, Midline Lower Leg) 1. Cleansed with: Clean wound with Normal Saline 4. Dressing Applied: Other dressing (specify in notes) 5. Secondary Dressing Applied ABD Pad Kerlix/Conform 7. Secured with Tape Notes xtrasorb, kerlix, coban, unna to Ecologist) Signed: 10/17/2017 4:07:12 PM By: [name] Entered By: [name] on 10/17/2017 11:44:08 Durnil, [name] ([id number]) -------------------------------------------------------------------------------- Wound Assessment Details Patient Name: Carbonell, Mase P. Date of Service: 10/17/2017 11:15 AM Medical Record Number: [id number] Patient Account Number: [id number] Date of Birth/Sex: 1960/12/13 (57 y.o. M) Treating RN: [name] Primary Care Cassidi Modesitt: [name] Other Clinician: Referring Hibah Odonnell: [name] Treating Milda Lindvall/Extender: [name], [name] in Treatment: 10 Wound Status Wound Number: 24 Primary Diabetic Wound/Ulcer of the Lower Extremity Etiology: Wound Location: Right Lower Leg - Posterior Wound Open Wounding Event: Gradually Appeared Status: Date Acquired: 10/17/2017 Comorbid Lymphedema, Sleep Apnea, Arrhythmia, Deep [name] Of Treatment: 0 History: Vein Thrombosis, Hypertension, Myocardial Clustered Wound: Yes Infarction, Type II Diabetes, Neuropathy Photos Photo Uploaded By: Elliot Gurney, BSN, RN, CWS, Kim on 10/17/2017 15:47:25 Wound Measurements Length: (cm) 3.9 % Reducti Width: (cm) 4 % Reducti Depth: (cm) 0.1 Area: (cm)  12.252 Volume: (cm) 1.225 on in Area: 0% on in Volume: 0% Wound Description Classification: Grade 1 Foul Odor Wound Margin: Flat and Intact Slough/Fi Exudate Amount: Large Exudate Type: Serous Exudate Color: amber After Cleansing: No brino No Wound Bed Granulation Amount: Large (67-100%) Exposed Structure Granulation Quality: Red Fascia Exposed: No Necrotic Amount: None Present (0%) Fat Layer (Subcutaneous Tissue) Exposed: No Tendon Exposed: No Muscle Exposed: No Joint Exposed: No Bone Exposed: No Limited to Skin Breakdown [place], [place] P. ([id number]) Periwound Skin Texture Texture Color No Abnormalities Noted: No No Abnormalities Noted: No Callus: No Atrophie Blanche: No Crepitus: No Cyanosis: No Excoriation: No Ecchymosis: No Induration: No Erythema: Yes Rash: No Erythema Location: Circumferential Scarring: No Hemosiderin Staining: No Mottled: No Moisture Pallor: No No Abnormalities Noted: No Rubor: No Dry / Scaly: No Maceration: No Temperature / Pain Temperature: No Abnormality Tenderness on Palpation: Yes Wound Preparation Ulcer Cleansing: Rinsed/Irrigated with Saline Topical Anesthetic Applied: None Assessment Notes lots of weeping drainage noted Treatment Notes Wound #24 (Right, Posterior Lower Leg) 1. Cleansed with: Clean wound with Normal Saline 4. Dressing Applied: Other dressing (specify in notes) 5. Secondary Dressing Applied ABD Pad Kerlix/Conform 7. Secured  with Tape Notes xtrasorb, kerlix, coban, unna to Ecologist) Signed: 10/17/2017 4:07:12 PM By: [name] Entered By: [name] on 10/17/2017 11:43:17 Branscum, [name] ([id number]) -------------------------------------------------------------------------------- Vitals Details Patient Name: Sublett, Yon P. Date of Service: 10/17/2017 11:15 AM Medical Record Number: [id number] Patient Account Number: [id number] Date of Birth/Sex: Nov 18, 1960 (57 y.o.  M) Treating RN: [name] Primary Care Skyeler Scalese: [name] Other Clinician: Referring Johnnye Sandford: [name] Treating Joshlyn Beadle/Extender: [name], [name] in Treatment: 10 Vital Signs Time Taken: 11:25 Temperature (F): 98.4 Height (in): 74 Pulse (bpm): 63 Weight (lbs): 310 Respiratory Rate (breaths/min): 20 Body Mass Index (BMI): 39.8 Blood Pressure (mmHg): 144/99 Reference Range: 80 - 120 mg / dl Electronic Signature(s) Signed: 10/17/2017 4:07:12 PM By: [name] Entered By: [name] on 10/17/2017 11:27:46

## 2017-10-21 NOTE — Progress Notes (Signed)
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 (General Health) Denies complaints or symptoms of Fever, Chills. Respiratory The patient has no complaints or symptoms. Cardiovascular Complains or has symptoms of LE edema. Denies complaints or symptoms of Chest pain. Psychiatric The patient has no complaints or symptoms. Coto, Devaunte P. ([id number]) Objective Constitutional Well-nourished and well-hydrated in no acute distress. Vitals Time Taken: 11:25 AM, Height: 74 in, Weight: 310 lbs, BMI: 39.8, Temperature: 98.4 F, Pulse: 63 bpm, Respiratory Rate: 20 breaths/min, Blood Pressure: 144/99 mmHg. Respiratory normal breathing without difficulty. clear to auscultation bilaterally. Cardiovascular regular rate and rhythm with normal S1, S2. 2+ pitting edema of the bilateral lower extremities. Psychiatric this patient is able to make decisions and demonstrates good insight into disease process. Alert and Oriented x 3. pleasant and cooperative. General Notes: On evaluation today patient appears to have multiple wounds none of which required sharp debridement at this point which is good news. With that being said I do believe he needs compression unfortunately he tells me that he is not able to pay for the Juxta-Lite compression wraps. He also tells me  that stockings that he is using the past are not beneficial for him. I think he may need to get custom compression stockings from elastic therapy in  if he is not able to afford the Juxta-Lite. He states he will look into this. Nonetheless I think this needs to be done as soon as possible in the meantime we can continue with the current Kerlex in Coban wraps and we will also work on getting the appointment for his vascular testing. Integumentary (Hair, Skin) Wound #21 status is Open. Original cause of wound was Gradually Appeared. The wound is located on the Right Toe Great. The wound measures 0.3cm length x 0.3cm width x 0.1cm depth; 0.071cm^2 area and 0.007cm^3 volume. There is no tunneling or undermining noted. There is a none present amount of drainage noted. The wound margin is distinct with the outline attached to the wound base. There is no granulation within the wound bed. There is a large (67-100%) amount of necrotic tissue within the wound bed including Eschar. The periwound skin appearance did not exhibit: Callus, Crepitus, Excoriation, Induration, Rash, Scarring, Dry/Scaly,  Maceration, Atrophie Blanche, Cyanosis, Ecchymosis, Hemosiderin Staining, Mottled, Pallor, Rubor, Erythema. Periwound temperature was noted as No Abnormality. Wound #22 status is Open. Original cause of wound was Gradually Appeared. The wound is located on the Left,Midline Lower Leg. The wound measures 1.9cm length x 0.6cm width x 0.1cm depth; 0.895cm^2 area and 0.09cm^3 volume. There is no tunneling or undermining noted. There is a large amount of serous drainage noted. The wound margin is flat and intact. There is large (67-100%) red granulation within the wound bed. There is no necrotic tissue within the wound bed. The periwound skin appearance exhibited: Erythema. The periwound skin appearance did not exhibit: Callus, Crepitus, Excoriation, Induration, Rash, Scarring, Dry/Scaly, Maceration, Atrophie  Blanche, Cyanosis, Ecchymosis, Hemosiderin Staining, Mottled, Pallor, Rubor. The surrounding wound skin color is noted with erythema which is circumferential. Periwound temperature was noted as No Abnormality. Wound #23 status is Open. Original cause of wound was Gradually Appeared. The wound is located on the Right,Midline Lower Leg. The wound measures 6.2cm length x 2cm width x 0.1cm depth; 9.739cm^2 area and 0.974cm^3 volume. The wound is limited to skin breakdown. There is no tunneling or undermining noted. There is a large amount of serous drainage noted. The wound margin is flat and intact. There is large (67-100%) red granulation within the wound bed. There is no necrotic tissue within the wound bed. The periwound skin appearance did not exhibit: Callus, Crepitus, Excoriation, Induration, Rash, Scarring, Dry/Scaly, Maceration, Atrophie Blanche, Cyanosis, Ecchymosis, Hemosiderin Staining, Mottled, Pallor, Rubor, Erythema. Periwound temperature was noted as No Abnormality. The periwound has tenderness on palpation. Stofer, Zohar P. ([id number]) Wound #24 status is Open. Original cause of wound was Gradually Appeared. The wound is located on the Right,Posterior Lower Leg. The wound measures 3.9cm length x 4cm width x 0.1cm depth; 12.252cm^2 area and 1.225cm^3 volume. The wound is limited to skin breakdown. There is a large amount of serous drainage noted. The wound margin is flat and intact. There is large (67-100%) red granulation within the wound bed. There is no necrotic tissue within the wound bed. The periwound skin appearance exhibited: Erythema. The periwound skin appearance did not exhibit: Callus, Crepitus, Excoriation, Induration, Rash, Scarring, Dry/Scaly, Maceration, Atrophie Blanche, Cyanosis, Ecchymosis, Hemosiderin Staining, Mottled, Pallor, Rubor. The surrounding wound skin color is noted with erythema which is circumferential. Periwound temperature was noted as No Abnormality. The  periwound has tenderness on palpation. 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 Cleanse wound with mild soap and water Wound #22 Left,Midline Lower Leg: Clean wound with Normal Saline. Cleanse wound with mild soap and water Wound #23 Right,Midline Lower Leg: Clean wound with Normal Saline. Cleanse wound with mild soap and water Wound #24 Right,Posterior Lower Leg: Clean wound with Normal Saline. Cleanse wound with mild soap and water Skin Barriers/Peri-Wound Care: Wound #21 Right Toe Great: Moisturizing lotion Wound #22 Left,Midline Lower Leg: Moisturizing lotion Wound #23 Right,Midline Lower Leg: Moisturizing lotion Wound #24 Right,Posterior Lower Leg: Moisturizing lotion  Maynor, GREEN QUINCY ([id number]) Primary Wound Dressing: Wound #21 Right Toe Great: Other: - betadine Wound #22 Left,Midline Lower Leg: Hydrafera Blue Ready Transfer Wound #23 Right,Midline Lower Leg: Hydrafera Blue Ready Transfer Wound #24 Right,Posterior Lower Leg: Hydrafera Blue Ready Transfer Secondary Dressing: Wound #22 Left,Midline Lower Leg: ABD pad XtraSorb Wound #23 Right,Midline Lower Leg: ABD pad XtraSorb Wound #24 Right,Posterior Lower Leg: ABD pad XtraSorb Dressing Change Frequency: Wound #21 Right Toe Great: Change dressing every week Wound #22 Left,Midline  Lower Leg: Change dressing every week Wound #23 Right,Midline Lower Leg: Change dressing every week Wound #24 Right,Posterior Lower Leg: Change dressing every week Follow-up Appointments: Wound #21 Right Toe Great: Return Appointment in 1 week. Wound #22 Left,Midline Lower Leg: Return Appointment in 1 week. Wound #23 Right,Midline Lower Leg: Return Appointment in 1 week. Wound #24 Right,Posterior Lower Leg: Return Appointment in 1 week. Edema Control: Wound #22 Left,Midline Lower Leg: Kerlix and Coban - Bilateral - unna to anchor Elevate legs to the level of the heart and pump ankles as often as possible Wound #23 Right,Midline Lower Leg: Kerlix and Coban - Bilateral - unna to anchor Elevate legs to the level of the heart and pump ankles as often as possible Wound #24 Right,Posterior Lower Leg: Kerlix and Coban - Bilateral - unna to anchor Elevate legs to the level of the heart and pump ankles as often as possible Additional Orders / Instructions: Wound #21 Right Toe Great: Increase protein intake. Wound #22 Left,Midline Lower Leg: Increase protein intake. Wound #23 Right,Midline Lower Leg: Increase protein intake. Wound #24 Right,Posterior Lower Leg: Increase protein intake. General Notes: resend arterial referral to [facility] [place], [place] P. ([id number]) Patient's wound to general seems to be worse in regard to his bilateral lower extremities where he has new openings. In regard to his right great toe this is almost completely healed if it's not already healed in fact. We will see him for reevaluation in one weeks time to see were things stand. Please see above for specific wound care orders. We will see patient for re-evaluation in 1 week(s) here in the clinic. If anything worsens or changes patient will contact our office for additional recommendations. Electronic Signature(s) Signed: 10/17/2017 5:37:52 PM By: Lenda Kelp PA-C Entered By: Lenda Kelp on 10/17/2017  13:23:37 Isley, [name] ([id number]) -------------------------------------------------------------------------------- ROS/PFSH Details Patient Name: [name] Pikes P. Date of Service: 10/17/2017 11:15 AM Medical Record Number: [id number] Patient Account Number: [id number] Date of Birth/Sex: May 17, 1961 (57 y.o. M) Treating RN: Curtis Sites Primary Care Provider: Hillery Aldo Other Clinician: Referring Provider: Hillery Aldo Treating Provider/Extender: Linwood Dibbles, Annalia Metzger Weeks in Treatment: 10 Information Obtained From Patient Wound History Do you currently have one or more open woundso Yes How many open wounds do you currently haveo 5 Approximately how long have you had your woundso several months How have you been treating your wound(s) until nowo doxycycline for cellulitis Has your wound(s) ever healed and then re-openedo No Have you had any lab work done in the past montho No Have you tested positive for an antibiotic resistant organism (MRSA, VRE)o Yes Have you tested positive for osteomyelitis (bone infection)o No Have you had any tests for circulation on your legso Yes Who ordered the testo cone 5 years ago Constitutional Symptoms (General Health) Complaints and Symptoms: Negative for: Fever; Chills Cardiovascular Complaints and Symptoms: Positive for: LE edema Negative for: Chest pain Medical History: Positive for: Arrhythmia - afib; Deep Vein Thrombosis; Hypertension; Myocardial Infarction Negative for: Angina; Congestive Heart  Failure; Coronary Artery Disease; Hypotension; Peripheral Arterial Disease; Peripheral Venous Disease; Phlebitis Eyes Medical History: Negative for: Cataracts; Glaucoma; Optic Neuritis Ear/Nose/Mouth/Throat Medical History: Negative for: Chronic sinus problems/congestion; Middle ear problems Hematologic/Lymphatic Medical History: Positive for: Lymphedema Negative for: Anemia; Hemophilia; Human Immunodeficiency Virus; Sickle Cell  Disease Respiratory Complaints and Symptoms: No Complaints or Symptoms Monsanto, Ezekial P. ([id number]) Medical History: Positive for: Sleep Apnea - cpap and ventalator for trach Negative for: Aspiration; Asthma; Chronic Obstructive Pulmonary Disease (COPD); Pneumothorax; Tuberculosis Gastrointestinal Medical History: Negative for: Cirrhosis ; Colitis; Crohnos; Hepatitis A; Hepatitis B; Hepatitis C Endocrine Medical History: Positive for: Type II Diabetes Time with diabetes: 6 years Treated with: Insulin Blood sugar tested every day: No Blood sugar testing results: Bedtime: 132 Genitourinary Medical History: Negative for: End Stage Renal Disease Immunological Medical History: Negative for: Lupus Erythematosus; Raynaudos; Scleroderma Musculoskeletal Medical History: Negative for: Gout; Rheumatoid Arthritis; Osteoarthritis; Osteomyelitis Neurologic Medical History: Positive for: Neuropathy Negative for: Quadriplegia; Paraplegia; Seizure Disorder Oncologic Medical History: Negative for: Received Chemotherapy; Received Radiation Psychiatric Complaints and Symptoms: No Complaints or Symptoms Medical History: Negative for: Anorexia/bulimia; Confinement Anxiety Immunizations Pneumococcal Vaccine: Received Pneumococcal Vaccination: Yes [name], [name] ([id number]) Implantable Devices Family and Social History Cancer: Yes - Siblings; Diabetes: No; Heart Disease: Yes - Siblings,Father,Mother,Paternal Grandparents,Maternal Grandparents; Hereditary Spherocytosis: No; Hypertension: Yes - Child,Siblings,Father,Paternal Grandparents,Maternal Grandparents; Kidney Disease: No; Lung Disease: No; Seizures: No; Stroke: No; Thyroid Problems: No; Tuberculosis: Yes - Maternal Grandparents; Never smoker; Marital Status - Divorced; Alcohol Use: Never; Drug Use: No History; Caffeine Use: Daily - coffee; Financial Concerns: No; Food, Clothing or Shelter Needs: No; Support System Lacking: No;  Transportation Concerns: No; Advanced Directives: No; Patient does not want information on Advanced Directives; Living Will: No Physician Affirmation I have reviewed and agree with the above information. Electronic Signature(s) Signed: 10/17/2017 4:37:07 PM By: Curtis Sites Signed: 10/17/2017 5:37:52 PM By: Lenda Kelp PA-C Entered By: Lenda Kelp on 10/17/2017 13:21:54 Tibbett, [name] ([id number]) -------------------------------------------------------------------------------- SuperBill Details Patient Name: [name], [name] Date of Service: 10/17/2017 Medical Record Number: [id number] Patient Account Number: [id number] Date of Birth/Sex: February 26, 1961 (57 y.o. M) Treating RN: Curtis Sites Primary Care Provider: Hillery Aldo Other Clinician: Referring Provider: Hillery Aldo Treating Provider/Extender: Linwood Dibbles, Amyr Sluder Weeks in Treatment: 10 Diagnosis Coding ICD-10 Codes Code Description [phone] Non-pressure chronic ulcer of other part of right foot with fat layer exposed E11.621 Type 2 diabetes mellitus with foot ulcer I89.0 Lymphedema, not elsewhere classified E11.622 Type 2 diabetes mellitus with other skin ulcer I50.42 Chronic combined systolic (congestive) and diastolic (congestive) heart failure I48.2 Chronic atrial fibrillation S60.321S Blister (nonthermal) of right thumb, sequela S60.420S Blister (nonthermal) of right index finger, sequela R60.9 Edema, unspecified Facility Procedures CPT4 Code: [id number] Description: [phone] - WOUND CARE VISIT-LEV 5 EST PT Modifier: Quantity: 1 Physician Procedures CPT4 Code: [id number] Description: 99213 - WC PHYS LEVEL 3 - EST PT ICD-10 Diagnosis Description L97.512 Non-pressure chronic ulcer of other part of right foot with f E11.621 Type 2 diabetes mellitus with foot ulcer I89.0 Lymphedema, not elsewhere classified E11.622 Type 2  diabetes mellitus with other skin ulcer Modifier: at layer exposed Quantity: 1 Electronic  Signature(s) Signed: 10/17/2017 5:37:52 PM By: Lenda Kelp PA-C Entered By: Lenda Kelp on 10/17/2017 13:23:57

## 2017-10-22 NOTE — Discharge Summary (Signed)
[employer] - [place] [facility]   PATIENT NAME: [name]    MR#:  [id number]  DATE OF BIRTH:  1960-11-27  DATE OF ADMISSION:  10/07/2017 ADMITTING PHYSICIAN: Milagros Loll, MD  DATE OF DISCHARGE: 10/09/2017  4:00 PM  PRIMARY CARE PHYSICIAN: Hillery Aldo, MD   ADMISSION DIAGNOSIS:  Hyponatremia [E87.1]  DISCHARGE DIAGNOSIS:  Active Problems:   Hyponatremia   SECONDARY DIAGNOSIS:   Past Medical History:  Diagnosis Date  . CHF (congestive heart failure) (HCC)   . Diabetes mellitus (HCC)   . Diabetic peripheral neuropathy (HCC)   . Elevated liver enzymes    fatty liver per [facility]  . GERD (gastroesophageal reflux disease)   . History of pulmonary embolus (PE)   . Hyperlipidemia   . Hypertension   . Myocardial infarct (HCC)   . OSA (obstructive sleep apnea)   . 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 Renal insufficiency      ADMITTING HISTORY  HISTORY OF PRESENT ILLNESS:  [name]  is a 57 y.o. male with a known history of hypertension, diabetes mellitus, chronic diastolic CHF, alcohol abuse presents to the hospital due to dizziness and weakness.  Patient had blood work checked for his pre-colonoscopy evaluation.  He was found to have sodium of 120 and sent to the emergency room. Patient was recently in the hospital for the same.  This was thought to be multifactorial.  Sodium improved 132 when he was discharged home.  Patient continues to drink 7-8 beers a day.     HOSPITAL COURSE:   *Hyponatremia due to multifactorial causes.  Pulmonary issues, alcohol abuse with significant beer intake.  Malnutrition. Patient was initially on IV fluids until his dehydration was corrected.  After this fluids were stopped and he was started on salt tablets.  Also Lasix.  Given tolvaptan.  Patient's sodium has improved well.  He does have chronic mild hyponatremia.  Back to baseline.  Patient was seen by nephrology in the  hospital.  He will follow-up with nephrology Dr. Lourdes Sledge as outpatient.  *Hypertension.  Home medications continued.  *Diabetes mellitus.  Continue Lantus.  Sliding scale insulin in the hospital.  Well-controlled.  *Proximal atrial fibrillation with amiodarone and metoprolol.  Xarelto.  Alcohol abuse.  Patient counseled to quit.  No signs of withdrawal in the hospital.  Stable for discharge home.  CONSULTS OBTAINED:  Treatment Team:  Mady Haagensen, MD  DRUG ALLERGIES:   Allergies  Allergen Reactions  . Cephalexin     Other reaction(s): Unknown  . Hctz  [Hydrochlorothiazide]     Other reaction(s): Other (See Comments) hyponatremia  . Hydralazine   . Penicillins Hives    Has patient had a PCN reaction causing immediate rash, facial/tongue/throat swelling, SOB or lightheadedness with hypotension: Yes Has patient had a PCN reaction causing severe rash involving mucus membranes or skin necrosis: Yes Has patient had a PCN reaction that required hospitalization: Unknown Has patient had a PCN reaction occurring within the last 10 years: No If all of the above answers are "NO", then may proceed with Cephalosporin use.   Marland Kitchen Reserpine   . Zaroxolyn [Metolazone]     Other reaction(s): Unknown    DISCHARGE MEDICATIONS:   Allergies as of 10/09/2017      Reactions   Cephalexin    Other reaction(s): Unknown   Hctz  [hydrochlorothiazide]    Other reaction(s): Other (See Comments) hyponatremia   Hydralazine  Penicillins Hives   Has patient had a PCN reaction causing immediate rash, facial/tongue/throat swelling, SOB or lightheadedness with hypotension: Yes Has patient had a PCN reaction causing severe rash involving mucus membranes or skin necrosis: Yes Has patient had a PCN reaction that required hospitalization: Unknown Has patient had a PCN reaction occurring within the last 10 years: No If all of the above answers are "NO", then may proceed with Cephalosporin use.   Reserpine     Zaroxolyn [metolazone]    Other reaction(s): Unknown      Medication List    TAKE these medications   acetaminophen 325 MG tablet Commonly known as:  TYLENOL Take 1 tablet (325 mg total) by mouth every 6 (six) hours as needed for mild pain (or Fever >/= 101).   ALPRAZolam 0.5 MG tablet Commonly known as:  XANAX Take 1 tablet (0.5 mg total) by mouth every 8 (eight) hours as needed for anxiety.   amiodarone 200 MG tablet Commonly known as:  PACERONE Take 200 mg by mouth 2 (two) times daily.   atorvastatin 40 MG tablet Commonly known as:  LIPITOR Take 1 tablet (40 mg total) by mouth daily at 6 PM.   atorvastatin 80 MG tablet Commonly known as:  LIPITOR Take 80 mg by mouth at bedtime.   busPIRone 10 MG tablet Commonly known as:  BUSPAR Take 10 mg by mouth 2 (two) times daily as needed.   carbamazepine 200 MG tablet Commonly known as:  TEGRETOL Take 2 tablets (400 mg total) by mouth daily at 10 pm.   collagenase ointment Commonly known as:  SANTYL Apply topically daily.   enalapril 2.5 MG tablet Commonly known as:  VASOTEC Take 1 tablet (2.5 mg total) by mouth daily. What changed:    medication strength  how much to take  when to take this   feeding supplement (GLUCERNA SHAKE) Liqd Take 237 mLs by mouth 2 (two) times daily between meals.   folic acid 1 MG tablet Commonly known as:  FOLVITE Take 1 tablet (1 mg total) by mouth daily.   furosemide 40 MG tablet Commonly known as:  LASIX Take 1 tablet (40 mg total) by mouth daily. What changed:    medication strength  how much to take  when to take this  reasons to take this   gabapentin 800 MG tablet Commonly known as:  NEURONTIN Take 1 tablet (800 mg total) by mouth 3 (three) times daily. 1 tablet twice daily What changed:    how much to take  when to take this  additional instructions   HUMALOG 100 UNIT/ML injection Generic drug:  insulin lispro Inject 2-10 Units into the skin 3 (three)  times daily with meals.   insulin detemir 100 unit/ml Soln Commonly known as:  LEVEMIR Inject 20 Units into the skin 2 (two) times daily.   lamoTRIgine 150 MG tablet Commonly known as:  LAMICTAL Take 150 mg by mouth 2 (two) times daily.   metFORMIN 500 MG 24 hr tablet Commonly known as:  GLUCOPHAGE-XR Take 1,000 mg by mouth 2 (two) times daily.   Metoprolol Tartrate 75 MG Tabs Take 75 mg by mouth 2 (two) times daily. 100 mg twice daily What changed:    medication strength  how much to take   multivitamin with minerals Tabs tablet Take 1 tablet by mouth daily.   omeprazole 20 MG capsule Commonly known as:  PRILOSEC Take 20 mg by mouth daily. Once daily   oxyCODONE-acetaminophen 10-325 MG tablet Commonly  known as:  PERCOCET Take 1 tablet by mouth 4 (four) times daily as needed for pain.   polyethylene glycol packet Commonly known as:  MIRALAX / GLYCOLAX Take 17 g by mouth daily as needed for mild constipation.   senna-docusate 8.6-50 MG tablet Commonly known as:  Senokot-S Take 2 tablets by mouth at bedtime as needed for mild constipation.   thiamine 100 MG tablet Take 1 tablet (100 mg total) by mouth daily.   XARELTO 20 MG Tabs tablet Generic drug:  rivaroxaban Take 20 mg by mouth daily.       Today   VITAL SIGNS:  Blood pressure 108/62, pulse (!) 53, temperature 98.6 F (37 C), temperature source Oral, resp. rate (!) 24, height  (1.88 m), weight (!) 141.6 kg (312 lb 2.7 oz), SpO2 97 %.  I/O:  No intake or output data in the 24 hours ending 10/22/17 1151  PHYSICAL EXAMINATION:  Physical Exam  GENERAL:  57 y.o.-year-old patient lying in the bed with no acute distress.  Orbitally obese LUNGS: Normal breath sounds bilaterally, no wheezing, rales,rhonchi or crepitation. No use of accessory muscles of respiration.  CARDIOVASCULAR: S1, S2 normal. No murmurs, rubs, or gallops.  ABDOMEN: Soft, non-tender, non-distended. Bowel sounds present. No  organomegaly or mass.  NEUROLOGIC: Moves all 4 extremities. PSYCHIATRIC: [name].  SKIN: No obvious rash, lesion, or ulcer.  Tracheostomy in place  DATA REVIEW:   CBC No results for input(s): WBC, HGB, HCT, PLT in the last 168 hours.  Chemistries  No results for input(s): NA, K, CL, CO2, GLUCOSE, BUN, CREATININE, CALCIUM, MG, AST, ALT, ALKPHOS, BILITOT in the last 168 hours.  Invalid input(s): GFRCGP  Cardiac Enzymes No results for input(s): TROPONINI in the last 168 hours.  Microbiology Results  Results for orders placed or performed during the hospital encounter of 08/27/17  MRSA PCR Screening     Status: None   Collection Time: 08/27/17  8:17 PM  Result Value Ref Range Status   MRSA by PCR NEGATIVE NEGATIVE Final    Comment:        The GeneXpert MRSA Assay (FDA approved for NASAL specimens only), is one component of a comprehensive MRSA colonization surveillance program. It is not intended to diagnose MRSA infection nor to guide or monitor treatment for MRSA infections. Performed at [facility], [address], [place], [place] [address]     RADIOLOGY:  No results found.  Follow up with PCP in 1 week.  Management plans discussed with the patient, family and they are in agreement.  CODE STATUS:  Code Status History    Date Active Date Inactive Code Status Order ID Comments User Context   10/07/2017 2128 10/09/2017 1909 Full Code [id number]  Milagros Loll, MD ED   08/27/2017 2014 09/02/2017 1632 Full Code [id number]  Houston Siren, MD Inpatient   07/23/2017 1337 07/23/2017 2310 Full Code [id number]  Arnaldo Natal, MD ED   07/17/2017 2020 07/20/2017 1325 Full Code [id number]  Adrian Saran, MD Inpatient   11/02/2016 2006 11/04/2016 1808 Full Code [id number]  Marguarite Arbour, MD Inpatient      TOTAL TIME TAKING CARE OF THIS PATIENT ON DAY OF DISCHARGE: more than 30 minutes.   Molinda Bailiff Ralonda Tartt M.D on 10/22/2017 at 11:51  AM  Between 7am to 6pm - Pager - [phone]  After 6pm go to [email or web address] - password [email or web address] [facility]  [facility]  Office  [phone]  CC:  Primary care physician; Hillery Aldo, MD  Note: This dictation was prepared with Dragon dictation along with smaller phrase technology. Any transcriptional errors that result from this process are unintentional.

## 2017-10-24 ENCOUNTER — Other Ambulatory Visit: Payer: Self-pay

## 2017-10-24 ENCOUNTER — Encounter: Payer: Medicare HMO | Attending: Physician Assistant | Admitting: Physician Assistant

## 2017-10-24 ENCOUNTER — Emergency Department
Admission: EM | Admit: 2017-10-24 | Discharge: 2017-10-24 | Disposition: A | Discharge status: Home / Self Care | Payer: Medicare HMO | Attending: Emergency Medicine | Admitting: Emergency Medicine

## 2017-10-24 DIAGNOSIS — E1140 Type 2 diabetes mellitus with diabetic neuropathy, unspecified: Secondary | ICD-10-CM | POA: Diagnosis not present

## 2017-10-24 DIAGNOSIS — J449 Chronic obstructive pulmonary disease, unspecified: Secondary | ICD-10-CM | POA: Insufficient documentation

## 2017-10-24 DIAGNOSIS — I110 Hypertensive heart disease with heart failure: Secondary | ICD-10-CM | POA: Diagnosis not present

## 2017-10-24 DIAGNOSIS — R55 Syncope and collapse: Secondary | ICD-10-CM | POA: Diagnosis present

## 2017-10-24 DIAGNOSIS — I509 Heart failure, unspecified: Secondary | ICD-10-CM | POA: Diagnosis not present

## 2017-10-24 DIAGNOSIS — L97512 Non-pressure chronic ulcer of other part of right foot with fat layer exposed: Secondary | ICD-10-CM | POA: Insufficient documentation

## 2017-10-24 DIAGNOSIS — I482 Chronic atrial fibrillation: Secondary | ICD-10-CM | POA: Insufficient documentation

## 2017-10-24 DIAGNOSIS — E1151 Type 2 diabetes mellitus with diabetic peripheral angiopathy without gangrene: Secondary | ICD-10-CM | POA: Insufficient documentation

## 2017-10-24 DIAGNOSIS — Z794 Long term (current) use of insulin: Secondary | ICD-10-CM | POA: Insufficient documentation

## 2017-10-24 DIAGNOSIS — I890 Lymphedema, not elsewhere classified: Secondary | ICD-10-CM | POA: Insufficient documentation

## 2017-10-24 DIAGNOSIS — I5042 Chronic combined systolic (congestive) and diastolic (congestive) heart failure: Secondary | ICD-10-CM | POA: Insufficient documentation

## 2017-10-24 DIAGNOSIS — E11621 Type 2 diabetes mellitus with foot ulcer: Secondary | ICD-10-CM | POA: Insufficient documentation

## 2017-10-24 DIAGNOSIS — F329 Major depressive disorder, single episode, unspecified: Secondary | ICD-10-CM | POA: Insufficient documentation

## 2017-10-24 LAB — BLOOD GAS, VENOUS
Acid-Base Excess: 6.5 mmol/L — ABNORMAL HIGH (ref 0.0–2.0)
Bicarbonate: 32.3 mmol/L — ABNORMAL HIGH (ref 20.0–28.0)
O2 SAT: 80.2 %
PATIENT TEMPERATURE: 37
PO2 VEN: 44 mmHg (ref 32.0–45.0)
pCO2, Ven: 51 mmHg (ref 44.0–60.0)
pH, Ven: 7.41 (ref 7.250–7.430)

## 2017-10-24 LAB — URINALYSIS, COMPLETE (UACMP) WITH MICROSCOPIC
BACTERIA UA: NONE SEEN
BILIRUBIN URINE: NEGATIVE
GLUCOSE, UA: NEGATIVE mg/dL
Hgb urine dipstick: NEGATIVE
KETONES UR: NEGATIVE mg/dL
LEUKOCYTES UA: NEGATIVE
NITRITE: NEGATIVE
PROTEIN: NEGATIVE mg/dL
SQUAMOUS EPITHELIAL / LPF: NONE SEEN (ref 0–5)
Specific Gravity, Urine: 1.006 (ref 1.005–1.030)
pH: 5 (ref 5.0–8.0)

## 2017-10-24 LAB — COMPREHENSIVE METABOLIC PANEL
ALBUMIN: 3.7 g/dL (ref 3.5–5.0)
ALT: 18 U/L (ref 17–63)
ANION GAP: 11 (ref 5–15)
AST: 27 U/L (ref 15–41)
Alkaline Phosphatase: 102 U/L (ref 38–126)
BUN: 21 mg/dL — AB (ref 6–20)
CHLORIDE: 90 mmol/L — AB (ref 101–111)
CO2: 28 mmol/L (ref 22–32)
Calcium: 8.7 mg/dL — ABNORMAL LOW (ref 8.9–10.3)
Creatinine, Ser: 0.97 mg/dL (ref 0.61–1.24)
GFR calc Af Amer: >60 mL/min (ref >60)
GFR calc non Af Amer: >60 mL/min (ref >60)
GLUCOSE: 130 mg/dL — AB (ref 65–99)
POTASSIUM: 4.3 mmol/L (ref 3.5–5.1)
Sodium: 129 mmol/L — ABNORMAL LOW (ref 135–145)
Total Bilirubin: 0.6 mg/dL (ref 0.3–1.2)
Total Protein: 7 g/dL (ref 6.5–8.1)

## 2017-10-24 LAB — CBC WITH DIFFERENTIAL/PLATELET
BASOS PCT: 2 %
Basophils Absolute: 0.1 10*3/uL (ref 0–0.1)
EOS ABS: 0.4 10*3/uL (ref 0–0.7)
EOS PCT: 5 %
HCT: 32.6 % — ABNORMAL LOW (ref 40.0–52.0)
Hemoglobin: 11 g/dL — ABNORMAL LOW (ref 13.0–18.0)
Lymphocytes Relative: 21 %
Lymphs Abs: 1.6 10*3/uL (ref 1.0–3.6)
MCH: 29.8 pg (ref 26.0–34.0)
MCHC: 33.6 g/dL (ref 32.0–36.0)
MCV: 88.8 fL (ref 80.0–100.0)
MONO ABS: 0.8 10*3/uL (ref 0.2–1.0)
Monocytes Relative: 10 %
NEUTROS ABS: 4.8 10*3/uL (ref 1.4–6.5)
Neutrophils Relative %: 62 %
PLATELETS: 259 10*3/uL (ref 150–440)
RBC: 3.67 MIL/uL — ABNORMAL LOW (ref 4.40–5.90)
RDW: 14.2 % (ref 11.5–14.5)
WBC: 7.7 10*3/uL (ref 3.8–10.6)

## 2017-10-24 LAB — TROPONIN I: Troponin I: <0.03 ng/mL (ref <0.03)

## 2017-10-24 MED ORDER — SODIUM CHLORIDE 0.9 % IV SOLN
Freq: Once | INTRAVENOUS | Status: AC
  Administered 2017-10-24: 1000 mL via INTRAVENOUS

## 2017-10-24 NOTE — ED Notes (Signed)
Attempt to interrogate pacemaker unsuccessful related to wireless placement. Dr Mayford Knife aware

## 2017-10-24 NOTE — ED Notes (Signed)
Dr Mayford Knife aware of heart rate. No new orders. Pt is resting comfortably. Friend went home to get pacemaker information and machine that they provided after placement

## 2017-10-24 NOTE — ED Triage Notes (Signed)
Pt arrives via ems from the wound center. Ems reports that the pt was bradycardic in the 40's for the wound center, ems reports that he was in a.fib for them. Pt reports that while he was talking on the phone the nurse told him that "I went out, but I never dropped the phone, so I'm not sure what happened." pt was to have his lower legs re-wrapped for the wounds on his legs. No distress noted at this time

## 2017-10-24 NOTE — ED Notes (Signed)
Voided 400 ml in urinal.

## 2017-10-24 NOTE — ED Notes (Signed)
Pt was at wound care for routine care and became weak and felt like he loss consciousness for afew seconds. Heart rate was around 40 and there is a concern per pt that his pacemaker is not working correctly.

## 2017-10-24 NOTE — ED Provider Notes (Signed)
[facility] Emergency Department Provider Note       Time seen: ----------------------------------------- 3:03 PM on 10/24/2017 -----------------------------------------   I have reviewed the triage vital signs and the nursing notes.  HISTORY   Chief Complaint No chief complaint on file.    HPI [name] is a 57 y.o. male with a history of CHF, diabetes, peripheral neuropathy, GERD, hyperlipidemia, hypertension, pancreatitis who presents to the ED for syncope.  Patient arrives by EMS from the wound center.  EMS reports patient was bradycardic into the 40s with the wound center.  EMS also reports she is in atrial fibrillation.  Patient reports he was talking on the phone and the nurse told him that he blacked out but he never dropped the phone.  Patient thinks he may be dehydrated, he was having his lower legs rewrapped for wounds.  He denies fevers, chills or other complaints.  Past Medical History:  Diagnosis Date  . CHF (congestive heart failure) ([place])   . Diabetes mellitus ([place])   . Diabetic peripheral neuropathy ([place])   . Elevated liver enzymes    fatty liver [facility]  . GERD (gastroesophageal reflux disease)   . History of pulmonary embolus (PE)   . Hyperlipidemia   . Hypertension   . Myocardial infarct ([place])   . OSA (obstructive sleep apnea)   . 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 Renal insufficiency     Patient Active Problem List   Diagnosis Date Noted  . RUQ pain   . Atrial fibrillation ([place]) 11/02/2016  . Chest pain 11/02/2016  . Hyponatremia 11/02/2016  . Chronic respiratory failure, unspecified whether with hypoxia or hypercapnia ([place]) 05/16/2014  . Ventilator dependence ([place]) 05/16/2014  . Obesity hypoventilation syndrome ([place]) 05/16/2014  . 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 CARDIAC SURGERY     With stent placement  .  FLEXIBLE BRONCHOSCOPY N/A 07/28/2017   Procedure: FLEXIBLE BRONCHOSCOPY;  Surgeon: [name], MD;  Location: [facility] ORS;  Service: Pulmonary;  Laterality: N/A;  . LEFT HEART CATH AND CORONARY ANGIOGRAPHY N/A 09/01/2017   Procedure: LEFT HEART CATH AND CORONARY ANGIOGRAPHY and PCI stent;  Surgeon: Yolonda Kida, MD;  Location: [facility];  Service: Cardiovascular;  Laterality: N/A;  . TRACHEOSTOMY  2007  . VASECTOMY      Allergies Cephalexin; Hctz  [hydrochlorothiazide]; Hydralazine; Penicillins; Reserpine; and Zaroxolyn [metolazone]  Social History Social History   Tobacco Use  . Smoking status: Never Smoker  . Smokeless tobacco: Never Used  Substance Use Topics  . Alcohol use: Yes    Alcohol/week: 3.6 - 4.2 oz    Types: 6 - 7 Cans of beer per week  . Drug use: No   Review of Systems Constitutional: Negative for fever. Cardiovascular: Negative for chest pain. Respiratory: Negative for shortness of breath. Gastrointestinal: Negative for abdominal pain, vomiting and diarrhea. Musculoskeletal: Negative for back pain. Skin: Positive for leg wounds Neurological: Negative for headaches, focal weakness or numbness.  All systems negative/normal/unremarkable except as stated in the HPI  ____________________________________________   PHYSICAL EXAM:  VITAL SIGNS: ED Triage Vitals  Enc Vitals Group     BP 10/24/17 1457 (!) 157/125     Pulse Rate 10/24/17 1457 (!) 57     Resp 10/24/17 1457 (!) 22     Temp 10/24/17 1457 98.2 F (36.8 C)     Temp Source 10/24/17  1457 Oral     SpO2 10/24/17 1457 97 %     Weight 10/24/17 1458 (!) 309 lb (140.2 kg)     Height 10/24/17 1458 _0  (1.88 m)     Head Circumference --      Peak Flow --      Pain Score 10/24/17 1458 0     Pain Loc --      Pain Edu? --      Excl. in [place]? --    Constitutional: Alert and oriented. Well appearing and in no distress. Eyes: Conjunctivae are normal. Normal extraocular  movements. ENT   Head: Normocephalic and atraumatic.   Nose: No congestion/rhinnorhea.   Mouth/Throat: Mucous membranes are moist.   Neck: No stridor.  Patient has a trach Cardiovascular: Normal rate, regular rhythm. No murmurs, rubs, or gallops. Respiratory: Normal respiratory effort without tachypnea nor retractions. Breath sounds are clear and equal bilaterally. No wheezes/rales/rhonchi. Gastrointestinal: Soft and nontender. Normal bowel sounds Musculoskeletal: Nontender with normal range of motion in extremities. No lower extremity tenderness nor edema. Neurologic:  Normal speech and language. No gross focal neurologic deficits are appreciated.  Skin:  Skin is warm, dry and intact. No rash noted. Psychiatric: Mood and affect are normal. Speech and behavior are normal.  ____________________________________________  EKG: Interpreted by me.  Atrial flutter with variable AV block, incomplete right bundle branch block, normal QT.  ____________________________________________  ED COURSE:  As part of my medical decision making, I reviewed the following data within the [place] History obtained from family if available, nursing notes, old chart and ekg, as well as notes from prior ED visits. Patient presented for syncope, we will assess with labs and imaging as indicated at this time.   Procedures ____________________________________________   LABS (pertinent positives/negatives)  Labs Reviewed  CBC WITH DIFFERENTIAL/PLATELET - Abnormal; Notable for the following components:      Result Value   RBC 3.67 (*)    Hemoglobin 11.0 (*)    HCT 32.6 (*)    All other components within normal limits  COMPREHENSIVE METABOLIC PANEL - Abnormal; Notable for the following components:   Sodium 129 (*)    Chloride 90 (*)    Glucose, Bld 130 (*)    BUN 21 (*)    Calcium 8.7 (*)    All other components within normal limits  URINALYSIS, COMPLETE (UACMP) WITH  MICROSCOPIC - Abnormal; Notable for the following components:   Color, Urine YELLOW (*)    APPearance CLEAR (*)    All other components within normal limits  BLOOD GAS, VENOUS - Abnormal; Notable for the following components:   Bicarbonate 32.3 (*)    Acid-Base Excess 6.5 (*)    All other components within normal limits  TROPONIN I   ____________________________________________  DIFFERENTIAL DIAGNOSIS   Dehydration, electrolyte abnormality, arrhythmia, MI  FINAL ASSESSMENT AND PLAN  Syncope   Plan: The patient had presented for syncope. Patient's labs did reveal mild hyponatremia.  Patient was was given a liter fluids and he feels much better now.  We have attempted to interrogate his Medtronic leadless pacemaker but the report is faxed to either his cardiologist here or at [facility].  There was no way to reroute the interpretation.  I have not seen any pacemaker dysfunction, his heart rate seems to be set at a rate of 50.  I sent some information to his cardiologist Dr. Clayborn Bigness who states he will contact them on Monday.   Laurence Aly, MD  Note: This note was generated in part or whole with voice recognition software. Voice recognition is usually quite accurate but there are transcription errors that can and very often do occur. I apologize for any typographical errors that were not detected and corrected.     Earleen Newport, MD 10/24/17 Karl Bales

## 2017-10-28 NOTE — Progress Notes (Signed)
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 to his bilateral lower extremities. He has new openings due to the fact that he has been in the hospital and they have not been wrapping him. He states that he was in the hospital due to low sodium. He's been having a lot of issues recently he also had a pacemaker place. Overall he has been tolerating the dressing changes without complication. He does not really like the wraps because he doesn't want to cover his foot but I explained that if we wrap him we have to cover the foot routes the wrap is going to cause additional swelling and issues in that regard. He still has not had his arterial study. 10/24/17 on evaluation today patient presents for follow-up concern is bilateral lower extremity ulcers. Fortunately this does not seem to be any worse in my opinion the fact is like seem to be fairly good. But this was however overshadowed by the fact that he has been feeling somewhat unusual today. He also tells me that he has been somewhat lightheaded just upon arriving here he asked for a washcloth due to the fact that he was concerned about that. He unfortunately is alone today his girlfriend is at home she has no phone and no car this is obviously of someone Onalee Hua concern for him. He states that his landlord may be able to get in touch with his girlfriend to let her  know you're getting his phone so we get that information to try to get in touch with her. Nonetheless I think he is going to need to go to the hospital for evaluation my concern is that his sodium level could have dropped or something else the lecture likewise could be going on nonetheless this is definitely not normal for him. In the past couple weeks he's actually had issues with a critically low sodium level which did end up with him having to have hospital stay. Patient History Information obtained from Patient. Family History Cancer - Siblings, Heart Disease -  Siblings,Father,Mother,Paternal Grandparents,Maternal Grandparents, Hypertension - Child,Siblings,Father,Paternal Grandparents,Maternal Grandparents, Tuberculosis - Maternal Grandparents, No family history of Diabetes, Hereditary Spherocytosis, Kidney Disease, Lung Disease, Seizures, Stroke, Thyroid Problems. Social History Never smoker, Marital Status - Divorced, Alcohol Use - Never, Drug Use - No History, Caffeine Use - Daily - coffee. Review of Systems (ROS) Constitutional Symptoms (General Health) Denies complaints or symptoms of Fever, Chills. [name] [name] [name]. ([id number]) Respiratory The patient has no complaints or symptoms. Cardiovascular Complains or has symptoms of LE edema. Psychiatric The patient has no complaints or symptoms. Objective Constitutional patient is hypotensive.. heart rate is depressed. Obese and well-hydrated in no acute distress. Eyes conjunctiva clear no eyelid edema noted. pupils equal round and reactive to light and accommodation. Ears, Nose, Mouth, and Throat no gross abnormality of ear auricles or external auditory canals. normal hearing noted during conversation. mucus membranes moist. Respiratory normal breathing without difficulty. clear to auscultation bilaterally. Cardiovascular regular rate and rhythm with normal S1, S2. 2+ pitting edema of the bilateral lower  extremities. Psychiatric this patient is able to make decisions and demonstrates good insight into disease process. Alert and Oriented x 3. pleasant and cooperative. General Notes: Currently patient wins do not appear to be significantly worse in my opinion we did not spend a lot of time on this part of the evaluation today and therefore I do not have a lot of detail however I do not see any reason to switch up the orders I think that he is done well with the wraps currently. My biggest concern is that his blood pressure is low and that his pulse is also low both of which are abnormal. He also states that he feels exhausted which is also not normal for him. 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 ([id number]) R60.9 - Edema, unspecified Plan Wound Cleansing: Wound #21 Right Toe Great: Clean wound with Normal Saline. Cleanse wound with mild soap and water Wound #22 Left,Midline Lower Leg: Clean wound with Normal Saline. Cleanse wound with mild soap and water Wound #23 Right,Midline Lower Leg: Clean wound with Normal Saline. Cleanse wound with mild soap and water Wound #24 Right,Posterior Lower Leg: Clean wound with Normal Saline. Cleanse wound with mild soap and water Skin Barriers/Peri-Wound Care: Wound #21 Right Toe Great: Moisturizing lotion Wound #22 Left,Midline Lower Leg: Moisturizing lotion Wound #23 Right,Midline Lower Leg: Moisturizing lotion Wound #24 Right,Posterior Lower  Leg: Moisturizing lotion Primary Wound Dressing: Wound #21 Right Toe Great: Other: - betadine Wound #22 Left,Midline Lower Leg: Hydrafera Blue Ready Transfer Wound #23 Right,Midline Lower Leg: Hydrafera Blue Ready Transfer Wound #24 Right,Posterior Lower Leg: Hydrafera Blue Ready Transfer Secondary Dressing: Wound #22 Left,Midline Lower Leg: ABD pad XtraSorb Wound #23 Right,Midline Lower Leg: ABD pad XtraSorb Wound #24 Right,Posterior Lower Leg: ABD pad XtraSorb Dressing Change Frequency: Wound #21 Right Toe Great: Change dressing every week Wound #22 Left,Midline Lower Leg: Change dressing every week Wound #23 Right,Midline Lower Leg: Change dressing every week Wound #24 Right,Posterior Lower Leg: Change dressing every week Weatherholtz, Sosaia [name]. ([id number]) Follow-up Appointments: Wound #21 Right Toe Great: Return  Appointment in 1 week. Wound #22 Left,Midline Lower Leg: Return Appointment in 1 week. Wound #23 Right,Midline Lower Leg: Return Appointment in 1 week. Wound #24 Right,Posterior Lower Leg: Return Appointment in 1 week. Edema Control: Wound #22 Left,Midline Lower Leg: Kerlix and Coban - Bilateral - unna to anchor Elevate legs to the level of the heart and pump ankles as often as possible Wound #23 Right,Midline Lower Leg: Kerlix and Coban - Bilateral - unna to anchor Elevate legs to the level of the heart and pump ankles as often as possible Wound #24 Right,Posterior Lower Leg: Kerlix and Coban - Bilateral - unna to anchor Elevate legs to the level of the heart and pump ankles as often as possible Additional Orders / Instructions: Wound #21 Right Toe Great: Increase protein intake. Wound #22 Left,Midline Lower Leg: Increase protein intake. Wound #23 Right,Midline Lower Leg: Increase protein intake. Wound #24 Right,Posterior Lower Leg: Increase protein intake. General Notes: Patient transferred to ER via EMS for care At this point I'm concerned about just  sending him to drive himself to go pick up his girlfriend and get to the hospital my suggestion would be for [place] to go ahead and sent him via EMS to the ER for evaluation again my concern mainly being is possible drop in sodium which has recently been an issue for him. Patient is in agreement with this plan. We did help him with getting in touch with his landlord who can get in touch with his girlfriend so that she can take a cab to get to this area and be with him as well is pick up the car. Patient is in agreement with this plan. Otherwise we're gonna get into the ER for further evaluation at this point today to ensure that everything is okay due to his vital signs and not appearing to be doing as well as I would've expected. Electronic Signature(s) Signed: 10/24/2017 2:22:56 PM By: Lenda Kelp PA-C Entered By: Lenda Kelp on 10/24/2017 14:22:01 [name] [name] [name] ([id number]) -------------------------------------------------------------------------------- ROS/PFSH Details Patient Name: [name] [name]. Date of Service: 10/24/2017 1:45 PM Medical Record Number: [id number] Patient Account Number: [id number] Date of Birth/Sex: 04/17/1961 (57 y.o. M) Treating RN: Curtis Sites Primary Care Provider: Hillery Aldo Other Clinician: Referring Provider: Hillery Aldo Treating Provider/Extender: Linwood Dibbles, HOYT Weeks in Treatment: 11 Information Obtained From Patient Wound History Do you currently have one or more open woundso Yes How many open wounds do you currently haveo 5 Approximately how long have you had your woundso several months How have you been treating your wound(s) until nowo doxycycline for cellulitis Has your wound(s) ever healed and then re-openedo No Have you had any lab work done in the past montho No Have you tested positive for an antibiotic resistant organism (MRSA, VRE)o Yes Have you tested positive for osteomyelitis (bone infection)o No Have you had any tests for  circulation on your legso Yes Who ordered the testo cone 5 years ago Constitutional Symptoms (General Health) Complaints and Symptoms: Negative for: Fever; Chills Cardiovascular Complaints and Symptoms: Positive for: LE edema Medical History: Positive for: Arrhythmia - afib; Deep Vein Thrombosis; Hypertension; Myocardial Infarction Negative for: Angina; Congestive Heart Failure; Coronary Artery Disease; Hypotension; Peripheral Arterial Disease; Peripheral Venous Disease; Phlebitis Eyes Medical History: Negative for: Cataracts; Glaucoma; Optic Neuritis Ear/Nose/Mouth/Throat Medical History: Negative for: Chronic sinus problems/congestion; Middle ear problems Hematologic/Lymphatic Medical History: Positive for: Lymphedema Negative for: Anemia; Hemophilia; Human Immunodeficiency Virus; Sickle Cell Disease Respiratory Complaints and Symptoms: No Complaints or  Symptoms Reeder, Masai [name]. ([id number]) Medical History: Positive for: Sleep Apnea - cpap and ventalator for trach Negative for: Aspiration; Asthma; Chronic Obstructive Pulmonary Disease (COPD); Pneumothorax; Tuberculosis Gastrointestinal Medical History: Negative for: Cirrhosis ; Colitis; Crohnos; Hepatitis A; Hepatitis B; Hepatitis C Endocrine Medical History: Positive for: Type II Diabetes Time with diabetes: 6 years Treated with: Insulin Blood sugar tested every day: No Blood sugar testing results: Bedtime: 132 Genitourinary Medical History: Negative for: End Stage Renal Disease Immunological Medical History: Negative for: Lupus Erythematosus; Raynaudos; Scleroderma Musculoskeletal Medical History: Negative for: Gout; Rheumatoid Arthritis; Osteoarthritis; Osteomyelitis Neurologic Medical History: Positive for: Neuropathy Negative for: Quadriplegia; Paraplegia; Seizure Disorder Oncologic Medical History: Negative for: Received Chemotherapy; Received Radiation Psychiatric Complaints and Symptoms: No Complaints  or Symptoms Medical History: Negative for: Anorexia/bulimia; Confinement Anxiety Immunizations Pneumococcal Vaccine: Received Pneumococcal Vaccination: Yes Implantable Devices Decoster, Fionn [name]. ([id number]) Family and Social History Cancer: Yes - Siblings; Diabetes: No; Heart Disease: Yes - Siblings,Father,Mother,Paternal Grandparents,Maternal Grandparents; Hereditary Spherocytosis: No; Hypertension: Yes - Child,Siblings,Father,Paternal Grandparents,Maternal Grandparents; Kidney Disease: No; Lung Disease: No; Seizures: No; Stroke: No; Thyroid Problems: No; Tuberculosis: Yes - Maternal Grandparents; Never smoker; Marital Status - Divorced; Alcohol Use: Never; Drug Use: No History; Caffeine Use: Daily - coffee; Financial Concerns: No; Food, Clothing or Shelter Needs: No; Support System Lacking: No; Transportation Concerns: No; Advanced Directives: No; Patient does not want information on Advanced Directives; Living Will: No Physician Affirmation I have reviewed and agree with the above information. Electronic Signature(s) Signed: 10/24/2017 2:22:56 PM By: Lenda Kelp PA-C Signed: 10/24/2017 4:48:08 PM By: Curtis Sites Entered By: Lenda Kelp on 10/24/2017 14:18:49 Ouellet, [name] [name] ([id number]) -------------------------------------------------------------------------------- SuperBill Details Patient Name: Mayhall, Angelgabriel [name]. Date of Service: 10/24/2017 Medical Record Number: [id number] Patient Account Number: [id number] Date of Birth/Sex: [birth year] (57 y.o. M) Treating RN: Curtis Sites Primary Care Provider: Hillery Aldo Other Clinician: Referring Provider: Hillery Aldo Treating Provider/Extender: Linwood Dibbles, HOYT Weeks in Treatment: 11 Diagnosis Coding ICD-10 Codes Code Description [phone] Non-pressure chronic ulcer of other part of right foot with fat layer exposed E11.621 Type 2 diabetes mellitus with foot ulcer I89.0 Lymphedema, not elsewhere classified E11.622 Type 2 diabetes  mellitus with other skin ulcer I50.42 Chronic combined systolic (congestive) and diastolic (congestive) heart failure I48.2 Chronic atrial fibrillation S60.321S Blister (nonthermal) of right thumb, sequela S60.420S Blister (nonthermal) of right index finger, sequela R60.9 Edema, unspecified Physician Procedures CPT4 Code: [id number] Description: 99214 - WC PHYS LEVEL 4 - EST PT ICD-10 Diagnosis Description L97.512 Non-pressure chronic ulcer of other part of right foot with f E11.621 Type 2 diabetes mellitus with foot ulcer I89.0 Lymphedema, not elsewhere classified E11.622 Type 2  diabetes mellitus with other skin ulcer Modifier: at layer exposed Quantity: 1 Electronic Signature(s) Signed: 10/24/2017 2:22:56 PM By: Lenda Kelp PA-C Entered By: Lenda Kelp on 10/24/2017 14:22:17

## 2017-10-31 NOTE — Progress Notes (Signed)
[name] ([id number]) Visit Report for 10/24/2017 Arrival Information Details Patient Name: [name], [name]. Date of Service: 10/24/2017 1:45 PM Medical Record Number: [id number] Patient Account Number: [id number] Date of Birth/Sex: Oct 26, 1960 (57 y.o. M) Treating RN: Huel Coventry Primary Care Keyra Virella: Hillery Aldo Other Clinician: Referring Milea Klink: Hillery Aldo Treating Flora Ratz/Extender: Linwood Dibbles, HOYT Weeks in Treatment: 11 Visit Information History Since Last Visit Added or deleted any medications: No Patient Arrived: Ambulatory Any new allergies or adverse reactions: No Arrival Time: 13:58 Had a fall or experienced change in No Accompanied By: self activities of daily living that may affect Transfer Assistance: None risk of falls: Patient Identification Verified: Yes Signs or symptoms of abuse/neglect since last visito No Secondary Verification Process Yes Hospitalized since last visit: No Completed: Implantable device outside of the clinic excluding No Patient Requires Transmission- No cellular tissue based products placed in the center Based Precautions: since last visit: Patient Has Alerts: Yes Has Dressing in Place as Prescribed: Yes Patient Alerts: noncompressible Has Compression in Place as Prescribed: Yes bilateral Pain Present Now: No Electronic Signature(s) Signed: 10/24/2017 5:40:02 PM By: Elliot Gurney, BSN, RN, CWS, Kim RN, BSN Entered By: Elliot Gurney, BSN, RN, CWS, Kim on 10/24/2017 13:58:43 Meschke, [name] ([id number]) -------------------------------------------------------------------------------- Encounter Discharge Information Details Patient Name: [name], [name] Date of Service: 10/24/2017 1:45 PM Medical Record Number: [id number] Patient Account Number: [id number] Date of Birth/Sex: 1961-05-22 (57 y.o. M) Treating RN: Curtis Sites Primary Care Kairos Panetta: Hillery Aldo Other Clinician: Referring Dequane Strahan: Hillery Aldo Treating Lasaro Primm/Extender: Skeet Simmer in Treatment: 11 Encounter Discharge Information Items Schedule Follow-up Appointment: No Medication Reconciliation completed and No provided to Patient/Care Shaunte Weissinger: Provided on Clinical Summary of Care: 10/24/2017 Form Type Recipient Paper Patient JC Electronic Signature(s) Signed: 10/29/2017 10:09:43 AM By: Gwenlyn Perking Entered By: Gwenlyn Perking on 10/24/2017 14:33:45 Blomquist, [name] ([id number]) -------------------------------------------------------------------------------- Pain Assessment Details Patient Name: [name], [name] Date of Service: 10/24/2017 1:45 PM Medical Record Number: [id number] Patient Account Number: [id number] Date of Birth/Sex: 04-09-1961 (57 y.o. M) Treating RN: Huel Coventry Primary Care Ourania Hamler: Hillery Aldo Other Clinician: Referring Lenis Nettleton: Hillery Aldo Treating Drezden Seitzinger/Extender: Linwood Dibbles, HOYT Weeks in Treatment: 11 Active Problems Location of Pain Severity and Description of Pain Patient Has Paino No Site Locations With Dressing Change: No Pain Management and Medication Current Pain Management: Electronic Signature(s) Signed: 10/24/2017 5:40:02 PM By: Elliot Gurney, BSN, RN, CWS, Kim RN, BSN Entered By: Elliot Gurney, BSN, RN, CWS, Kim on 10/24/2017 13:58:54 Gancarz, [name] ([id number]) -------------------------------------------------------------------------------- Vitals Details Patient Name: [name], [name] Date of Service: 10/24/2017 1:45 PM Medical Record Number: [id number] Patient Account Number: [id number] Date of Birth/Sex: [birth year] (57 y.o. M) Treating RN: Huel Coventry Primary Care Lynleigh Kovack: Hillery Aldo Other Clinician: Referring Alleigh Mollica: Hillery Aldo Treating Raileigh Sabater/Extender: Linwood Dibbles, HOYT Weeks in Treatment: 11 Vital Signs Time Taken: 14:15 Temperature (F): 97.6 Height (in): 74 Pulse (bpm): 48 Weight (lbs): 310 Respiratory Rate (breaths/min): 18 Body Mass Index (BMI): 39.8 Blood Pressure (mmHg): 98/48 Reference Range: 80 - 120  mg / dl Notes Manuel heart rate 48; BP 100/58 recheck; Patient flush, sweating and states he is exhausted. Patient has a history of coding and electrolyte imbalances. LOC 5-7 seconds one time. Electronic Signature(s) Signed: 10/24/2017 5:40:02 PM By: Elliot Gurney, BSN, RN, CWS, Kim RN, BSN Entered By: Elliot Gurney, BSN, RN, CWS, Kim on 10/24/2017 75:64:33

## 2017-11-03 ENCOUNTER — Ambulatory Visit: Payer: Medicare HMO | Admitting: Physician Assistant

## 2017-11-04 ENCOUNTER — Encounter: Payer: Medicare HMO | Admitting: Physician Assistant

## 2017-11-04 DIAGNOSIS — I5042 Chronic combined systolic (congestive) and diastolic (congestive) heart failure: Secondary | ICD-10-CM | POA: Diagnosis not present

## 2017-11-04 DIAGNOSIS — L97512 Non-pressure chronic ulcer of other part of right foot with fat layer exposed: Secondary | ICD-10-CM | POA: Diagnosis not present

## 2017-11-04 DIAGNOSIS — I482 Chronic atrial fibrillation: Secondary | ICD-10-CM | POA: Diagnosis not present

## 2017-11-04 DIAGNOSIS — I890 Lymphedema, not elsewhere classified: Secondary | ICD-10-CM | POA: Diagnosis not present

## 2017-11-04 DIAGNOSIS — F329 Major depressive disorder, single episode, unspecified: Secondary | ICD-10-CM | POA: Diagnosis not present

## 2017-11-04 DIAGNOSIS — I110 Hypertensive heart disease with heart failure: Secondary | ICD-10-CM | POA: Diagnosis not present

## 2017-11-04 DIAGNOSIS — E11621 Type 2 diabetes mellitus with foot ulcer: Secondary | ICD-10-CM | POA: Diagnosis not present

## 2017-11-04 DIAGNOSIS — J449 Chronic obstructive pulmonary disease, unspecified: Secondary | ICD-10-CM | POA: Diagnosis not present

## 2017-11-04 DIAGNOSIS — E1140 Type 2 diabetes mellitus with diabetic neuropathy, unspecified: Secondary | ICD-10-CM | POA: Diagnosis not present

## 2017-11-04 DIAGNOSIS — Z794 Long term (current) use of insulin: Secondary | ICD-10-CM | POA: Diagnosis not present

## 2017-11-04 DIAGNOSIS — E1151 Type 2 diabetes mellitus with diabetic peripheral angiopathy without gangrene: Secondary | ICD-10-CM | POA: Diagnosis not present

## 2017-11-06 NOTE — Progress Notes (Signed)
KONA, LOVER ([id number]) Visit Report for 11/04/2017 Arrival Information Details Patient Name: [name], [name]. Date of Service: 11/04/2017 1:00 PM Medical Record Number: [id number] Patient Account Number: [id number] Date of Birth/Sex: 05-Aug-1960 (57 y.o. M) Treating RN: Renne Crigler Primary Care Adleigh Mcmasters: Hillery Aldo Other Clinician: Referring Emberlynn Riggan: Hillery Aldo Treating Stellar Gensel/Extender: Linwood Dibbles, HOYT Weeks in Treatment: 13 Visit Information History Since Last Visit All ordered tests and consults were completed: No Patient Arrived: Ambulatory Added or deleted any medications: No Arrival Time: 13:11 Any new allergies or adverse reactions: No Accompanied By: self Had a fall or experienced change in No Transfer Assistance: None activities of daily living that may affect Patient Identification Verified: Yes risk of falls: Secondary Verification Process Yes Signs or symptoms of abuse/neglect since last visito No Completed: Hospitalized since last visit: No Patient Requires Transmission-Based No Implantable device outside of the clinic excluding No Precautions: cellular tissue based products placed in the center Patient Has Alerts: Yes since last visit: Patient Alerts: noncompressible Pain Present Now: No bilateral Electronic Signature(s) Signed: 11/04/2017 3:20:38 PM By: Renne Crigler Entered By: Renne Crigler on 11/04/2017 13:11:42 Buhrman, [name] ([id number]) -------------------------------------------------------------------------------- Encounter Discharge Information Details Patient Name: [name] Pikes P. Date of Service: 11/04/2017 1:00 PM Medical Record Number: [id number] Patient Account Number: [id number] Date of Birth/Sex: 08-14-1960 (57 y.o. M) Treating RN: Renne Crigler Primary Care Noble Cicalese: Hillery Aldo Other Clinician: Referring Johnetta Sloniker: Hillery Aldo Treating Giles Currie/Extender: Skeet Simmer in Treatment: 13 Encounter Discharge  Information Items Discharge Condition: Stable Ambulatory Status: Ambulatory Discharge Destination: Home Transportation: Private Auto Schedule Follow-up Appointment: Yes Clinical Summary of Care: Electronic Signature(s) Signed: 11/04/2017 3:20:38 PM By: Renne Crigler Entered By: Renne Crigler on 11/04/2017 14:44:19 Musolf, [name] ([id number]) -------------------------------------------------------------------------------- Lower Extremity Assessment Details Patient Name: [name], [name] Date of Service: 11/04/2017 1:00 PM Medical Record Number: [id number] Patient Account Number: [id number] Date of Birth/Sex: 06-Jan-1961 (57 y.o. M) Treating RN: Renne Crigler Primary Care Twanna Resh: Hillery Aldo Other Clinician: Referring Marca Gadsby: Hillery Aldo Treating Ameia Morency/Extender: Linwood Dibbles, HOYT Weeks in Treatment: 13 Edema Assessment Assessed: [Left: No] [Right: No] Edema: [Left: Yes] [Right: Yes] Calf Left: Right: Point of Measurement: 38 cm From Medial Instep 50.7 cm 47 cm Ankle Left: Right: Point of Measurement: 14 cm From Medial Instep 33 cm 28 cm Vascular Assessment Claudication: Claudication Assessment [Left:None] [Right:None] Pulses: Dorsalis Pedis Palpable: [Left:Yes] [Right:Yes] Posterior Tibial Extremity colors, hair growth, and conditions: Extremity Color: [Left:Hyperpigmented] [Right:Hyperpigmented] Hair Growth on Extremity: [Left:No] [Right:No] Temperature of Extremity: [Left:Warm] [Right:Warm] Capillary Refill: [Left:< 3 seconds] [Right:< 3 seconds] Toe Nail Assessment Left: Right: Thick: No No Discolored: No No Deformed: No No Improper Length and Hygiene: No No Electronic Signature(s) Signed: 11/04/2017 3:20:38 PM By: Renne Crigler Entered By: Renne Crigler on 11/04/2017 13:38:13 Scobey, [name] ([id number]) -------------------------------------------------------------------------------- Multi Wound Chart Details Patient Name: [name], [name] Date of  Service: 11/04/2017 1:00 PM Medical Record Number: [id number] Patient Account Number: [id number] Date of Birth/Sex: [birth year] (57 y.o. M) Treating RN: Phillis Haggis Primary Care Monserat Prestigiacomo: Hillery Aldo Other Clinician: Referring Karlena Luebke: Hillery Aldo Treating Parthenia Tellefsen/Extender: Linwood Dibbles, HOYT Weeks in Treatment: 13 Vital Signs Height(in): 74 Pulse(bpm): 56 Weight(lbs): 310 Blood Pressure(mmHg): 116/67 Body Mass Index(BMI): 40 Temperature(F): 98.7 Respiratory Rate 18 (breaths/min): Photos: [21:No Photos] [22:No Photos] [23:No Photos] Wound Location: [21:Right Toe Great] [22:Left, Midline Lower Leg] [23:Right Lower Leg - Midline] Wounding Event: [21:Gradually Appeared] [22:Gradually Appeared] [23:Gradually Appeared] Primary Etiology: [21:To be determined] [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] [23:Diabetic Wound/Ulcer of the Lower  Extremity] Comorbid History: [21:Lymphedema, Sleep Apnea, Arrhythmia, Deep Vein Thrombosis, Hypertension, Myocardial Infarction, Type II Diabetes, Neuropathy] [22:N/A] [23:Lymphedema, Sleep Apnea, Arrhythmia, Deep Vein Thrombosis, Hypertension, Myocardial  Infarction, Type II Diabetes, Neuropathy] Date Acquired: [21:10/02/2017] [22:10/17/2017] [23:10/17/2017] Weeks of Treatment: [21:4] [22:2] [23:2] Wound Status: [21:Open] [22:Healed - Epithelialized] [23:Open] Clustered Wound: [21:No] [22:No] [23:Yes] Measurements L x W x D [21:0.3x0.3x0.1] [22:0x0x0] [23:1x1x0.1] (cm) Area (cm) : [21:0.071] [22:0] [23:0.785] Volume (cm) : [21:0.007] [22:0] [23:0.079] % Reduction in Area: [21:43.70%] [22:100.00%] [23:91.90%] % Reduction in Volume: [21:46.20%] [22:100.00%] [23:91.90%] Classification: [21:Partial Thickness] [22:Grade 1] [23:Grade 1] Exudate Amount: [21:None Present] [22:N/A] [23:Large] Exudate Type: [21:N/A] [22:N/A] [23:Serous] Exudate Color: [21:N/A] [22:N/A] [23:amber] Wound Margin: [21:Distinct, outline attached] [22:N/A] [23:Flat and  Intact] Granulation Amount: [21:None Present (0%)] [22:N/A] [23:Large (67-100%)] Granulation Quality: [21:N/A] [22:N/A] [23:Red] Necrotic Amount: [21:Large (67-100%)] [22:N/A] [23:None Present (0%)] Necrotic Tissue: [21:Eschar] [22:N/A] [23:N/A] Exposed Structures: [21:Fascia: No Fat Layer (Subcutaneous Tissue) Exposed: No Tendon: No Muscle: No Joint: No Bone: No] [22:N/A] [23:Fascia: No Fat Layer (Subcutaneous Tissue) Exposed: No Tendon: No Muscle: No Joint: No Bone: No Limited to Skin Breakdown] Epithelialization: Large (67-100%) N/A None Periwound Skin Texture: Excoriation: No No Abnormalities Noted Excoriation: No Induration: No Induration: No Callus: No Callus: No Crepitus: No Crepitus: No Rash: No Rash: No Scarring: No Scarring: No Periwound Skin Moisture: Maceration: No No Abnormalities Noted Maceration: No Dry/Scaly: No Dry/Scaly: No Periwound Skin Color: Atrophie Blanche: No No Abnormalities Noted Atrophie Blanche: No Cyanosis: No Cyanosis: No Ecchymosis: No Ecchymosis: No Erythema: No Erythema: No Hemosiderin Staining: No Hemosiderin Staining: No Mottled: No Mottled: No Pallor: No Pallor: No Rubor: No Rubor: No Temperature: No Abnormality N/A No Abnormality Tenderness on Palpation: No No Yes Wound Preparation: Ulcer Cleansing: N/A Ulcer Cleansing: Rinsed/Irrigated with Saline Rinsed/Irrigated with Saline Topical Anesthetic Applied: Topical Anesthetic Applied: None, Other: lidocaine 4% None Wound Number: [id number] N/A Photos: No Photos No Photos N/A Wound Location: Right Lower Leg - Posterior Left Lower Leg - Posterior N/A Wounding Event: Gradually Appeared Gradually Appeared N/A Primary Etiology: Diabetic Wound/Ulcer of the Lymphedema N/A Lower Extremity Comorbid History: Lymphedema, Sleep Apnea, Lymphedema, Sleep Apnea, N/A Arrhythmia, Deep Vein Arrhythmia, Deep Vein Thrombosis, Hypertension, Thrombosis, Hypertension, Myocardial Infarction, Type II  Myocardial Infarction, Type II Diabetes, Neuropathy Diabetes, Neuropathy Date Acquired: 10/17/2017 11/04/2017 N/A Weeks of Treatment: 2 0 N/A Wound Status: Open Open N/A Clustered Wound: Yes No N/A Measurements L x W x D 1x1.5x0.1 1x1x0.1 N/A (cm) Area (cm) : 1.178 0.785 N/A Volume (cm) : 0.118 0.079 N/A % Reduction in Area: 90.40% 0.00% N/A % Reduction in Volume: 90.40% 0.00% N/A Classification: Grade 1 Partial Thickness N/A Exudate Amount: Large None Present N/A Exudate Type: Serous N/A N/A Exudate Color: amber N/A N/A Wound Margin: Flat and Intact Flat and Intact N/A Granulation Amount: Large (67-100%) None Present (0%) N/A Granulation Quality: Red N/A N/A Necrotic Amount: None Present (0%) Large (67-100%) N/A Necrotic Tissue: N/A Eschar N/A Exposed Structures: Fascia: No Fascia: No N/A Fat Layer (Subcutaneous Fat Layer (Subcutaneous Almond, Fawzi P. ([id number]) Tissue) Exposed: No Tissue) Exposed: No Tendon: No Tendon: No Muscle: No Muscle: No Joint: No Joint: No Bone: No Bone: No Epithelialization: None Large (67-100%) N/A Periwound Skin Texture: Excoriation: No Excoriation: No N/A Induration: No Induration: No Callus: No Callus: No Crepitus: No Crepitus: No Rash: No Rash: No Scarring: No Scarring: No Periwound Skin Moisture: Maceration: No Maceration: No N/A Dry/Scaly: No Dry/Scaly: No Periwound Skin Color: Atrophie Blanche: No Atrophie Blanche: No N/A Cyanosis: No  Cyanosis: No Ecchymosis: No Ecchymosis: No Erythema: No Erythema: No Hemosiderin Staining: No Hemosiderin Staining: No Mottled: No Mottled: No Pallor: No Pallor: No Rubor: No Rubor: No Temperature: No Abnormality N/A N/A Tenderness on Palpation: Yes No N/A Wound Preparation: Ulcer Cleansing: Ulcer Cleansing: N/A Rinsed/Irrigated with Saline Rinsed/Irrigated with Saline Topical Anesthetic Applied: Topical Anesthetic Applied: None None Treatment Notes Electronic  Signature(s) Signed: 11/05/2017 4:20:38 PM By: Alejandro Mulling Entered By: Alejandro Mulling on 11/04/2017 14:07:23 Hillary, [name] ([id number]) -------------------------------------------------------------------------------- Multi-Disciplinary Care Plan Details Patient Name: [name]. Date of Service: 11/04/2017 1:00 PM Medical Record Number: [id number] Patient Account Number: [id number] Date of Birth/Sex: [birth year] (57 y.o. M) Treating RN: Phillis Haggis Primary Care Accalia Rigdon: Hillery Aldo Other Clinician: Referring Jackye Dever: Hillery Aldo Treating Rustyn Conery/Extender: Linwood Dibbles, HOYT Weeks in Treatment: 13 Active Inactive ` Orientation to the Wound Care Program Nursing Diagnoses: Knowledge deficit related to the wound healing center program Goals: Patient/caregiver will verbalize understanding of the Wound Healing Center Program Date Initiated: 08/04/2017 Target Resolution Date: 11/29/2017 Goal Status: Active Interventions: Provide education on orientation to the wound center Notes: ` Peripheral Neuropathy Nursing Diagnoses: Knowledge deficit related to disease process and management of peripheral neurovascular dysfunction Goals: Patient/caregiver will verbalize understanding of disease process and disease management Date Initiated: 08/04/2017 Target Resolution Date: 01/31/2018 Goal Status: Active Interventions: Assess signs and symptoms of neuropathy upon admission and as needed Notes: ` Wound/Skin Impairment Nursing Diagnoses: Impaired tissue integrity Goals: Patient/caregiver will verbalize understanding of skin care regimen Date Initiated: 08/04/2017 Target Resolution Date: 01/31/2018 Goal Status: Active Ulcer/skin breakdown will have a volume reduction of 30% by week 4 Date Initiated: 08/04/2017 Target Resolution Date: 01/31/2018 ELIEL, DUDDING ([id number]) Goal Status: Active Interventions: Assess patient/caregiver ability to obtain necessary supplies Assess  patient/caregiver ability to perform ulcer/skin care regimen upon admission and as needed Assess ulceration(s) every visit Treatment Activities: Skin care regimen initiated : 08/04/2017 Notes: Electronic Signature(s) Signed: 11/05/2017 4:20:38 PM By: Alejandro Mulling Entered By: Alejandro Mulling on 11/04/2017 14:06:57 Reas, [name] ([id number]) -------------------------------------------------------------------------------- Pain Assessment Details Patient Name: Thuman, Sarthak P. Date of Service: 11/04/2017 1:00 PM Medical Record Number: [id number] Patient Account Number: [id number] Date of Birth/Sex: 12/20/1960 (57 y.o. M) Treating RN: Renne Crigler Primary Care Nahun Kronberg: Hillery Aldo Other Clinician: Referring Tonnya Garbett: Hillery Aldo Treating Lenoria Narine/Extender: Linwood Dibbles, HOYT Weeks in Treatment: 13 Active Problems Location of Pain Severity and Description of Pain Patient Has Paino No Site Locations Pain Management and Medication Current Pain Management: Electronic Signature(s) Signed: 11/04/2017 3:20:38 PM By: Renne Crigler Entered By: Renne Crigler on 11/04/2017 13:11:49 Trine, [name] ([id number]) -------------------------------------------------------------------------------- Patient/Caregiver Education Details Patient Name: [name]. Date of Service: 11/04/2017 1:00 PM Medical Record Number: [id number] Patient Account Number: [id number] Date of Birth/Gender: 1960/12/25 (57 y.o. M) Treating RN: Renne Crigler Primary Care Physician: Hillery Aldo Other Clinician: Referring Physician: Hillery Aldo Treating Physician/Extender: Skeet Simmer in Treatment: 13 Education Assessment Education Provided To: Patient Education Topics Provided Wound/Skin Impairment: Handouts: Caring for Your Ulcer Methods: Explain/Verbal Responses: State content correctly Electronic Signature(s) Signed: 11/04/2017 3:20:38 PM By: Renne Crigler Entered By: Renne Crigler on  11/04/2017 14:44:34 Talmadge, [name] ([id number]) -------------------------------------------------------------------------------- Wound Assessment Details Patient Name: Mcelmurry, Leverne P. Date of Service: 11/04/2017 1:00 PM Medical Record Number: [id number] Patient Account Number: [id number] Date of Birth/Sex: [birth year] (57 y.o. M) Treating RN: Phillis Haggis Primary Care Rahcel Shutes: Hillery Aldo Other Clinician: Referring Kesha Hurrell: Hillery Aldo Treating Siddiq Kaluzny/Extender: Linwood Dibbles, HOYT Weeks in Treatment: 13 Wound Status Wound Number:  [id number] Primary Trauma, Other Etiology: Wound Location: Right Toe Great Wound Healed - Epithelialized Wounding Event: Gradually Appeared Status: Date Acquired: 10/02/2017 Comorbid Lymphedema, Sleep Apnea, Arrhythmia, Deep Weeks Of Treatment: 4 History: Vein Thrombosis, Hypertension, Myocardial Clustered Wound: No Infarction, Type II Diabetes, Neuropathy Photos Photo Uploaded By: Renne Crigler on 11/04/2017 15:21:53 Wound Measurements Length: (cm) 0 % Re Width: (cm) 0 % Re Depth: (cm) 0 Epit Area: (cm) 0 Tun Volume: (cm) 0 Und duction in Area: 100% duction in Volume: 100% helialization: Large (67-100%) neling: No ermining: No Wound Description Classification: Partial Thickness Wound Margin: Distinct, outline attached Exudate Amount: None Present Foul Odor After Cleansing: No Slough/Fibrino Yes Wound Bed Granulation Amount: None Present (0%) Exposed Structure Necrotic Amount: Large (67-100%) Fascia Exposed: No Necrotic Quality: Eschar Fat Layer (Subcutaneous Tissue) Exposed: No Tendon Exposed: No Muscle Exposed: No Joint Exposed: No Bone Exposed: No Periwound Skin Texture Texture Color No Abnormalities Noted: No No Abnormalities Noted: No Toran, Renley P. ([id number]) Callus: No Atrophie Blanche: No Crepitus: No Cyanosis: No Excoriation: No Ecchymosis: No Induration: No Erythema: No Rash: No Hemosiderin Staining: No Scarring:  No Mottled: No Pallor: No Moisture Rubor: No No Abnormalities Noted: No Dry / Scaly: No Temperature / Pain Maceration: No Temperature: No Abnormality Wound Preparation Ulcer Cleansing: Rinsed/Irrigated with Saline Topical Anesthetic Applied: None, Other: lidocaine 4%, Electronic Signature(s) Signed: 11/05/2017 4:20:38 PM By: Alejandro Mulling Entered By: Alejandro Mulling on 11/04/2017 14:10:11 Willmott, [name] ([id number]) -------------------------------------------------------------------------------- Wound Assessment Details Patient Name: Spirito, Deakin P. Date of Service: 11/04/2017 1:00 PM Medical Record Number: [id number] Patient Account Number: [id number] Date of Birth/Sex: [birth year] (57 y.o. M) Treating RN: Renne Crigler Primary Care Jamarie Mussa: Hillery Aldo Other Clinician: Referring Beatrice Ziehm: Hillery Aldo Treating Kaimana Lurz/Extender: Linwood Dibbles, HOYT Weeks in Treatment: 13 Wound Status Wound Number: 22 Primary Diabetic Wound/Ulcer of the Lower Etiology: Extremity Wound Location: Left, Midline Lower Leg Wound Status: Healed - Epithelialized Wounding Event: Gradually Appeared Date Acquired: 10/17/2017 Weeks Of Treatment: 2 Clustered Wound: No Photos Photo Uploaded By: Renne Crigler on 11/04/2017 15:21:53 Wound Measurements Length: (cm) 0 % Width: (cm) 0 % Depth: (cm) 0 Area: (cm) 0 Volume: (cm) 0 Reduction in Area: 100% Reduction in Volume: 100% Wound Description Classification: Grade 1 Periwound Skin Texture Texture Color No Abnormalities Noted: No No Abnormalities Noted: No Moisture No Abnormalities Noted: No Electronic Signature(s) Signed: 11/04/2017 3:20:38 PM By: Renne Crigler Entered By: Renne Crigler on 11/04/2017 13:25:56 Rossi, [name] ([id number]) -------------------------------------------------------------------------------- Wound Assessment Details Patient Name: Aughenbaugh, Aram P. Date of Service: 11/04/2017 1:00 PM Medical Record Number:  [id number] Patient Account Number: [id number] Date of Birth/Sex: 1960-11-06 (57 y.o. M) Treating RN: Phillis Haggis Primary Care Shelbia Scinto: Hillery Aldo Other Clinician: Referring Ponciano Shealy: Hillery Aldo Treating Nidya Bouyer/Extender: Linwood Dibbles, HOYT Weeks in Treatment: 13 Wound Status Wound Number: 23 Primary Diabetic Wound/Ulcer of the Lower Extremity Etiology: Wound Location: Right, Midline Lower Leg Wound Healed - Epithelialized Wounding Event: Gradually Appeared Status: Date Acquired: 10/17/2017 Comorbid Lymphedema, Sleep Apnea, Arrhythmia, Deep Weeks Of Treatment: 2 History: Vein Thrombosis, Hypertension, Myocardial Clustered Wound: Yes Infarction, Type II Diabetes, Neuropathy Photos Photo Uploaded By: Renne Crigler on 11/04/2017 15:22:39 Wound Measurements Length: (cm) 0 Width: (cm) 0 Depth: (cm) 0 Area: (cm) 0 Volume: (cm) 0 % Reduction in Area: 100% % Reduction in Volume: 100% Epithelialization: None Tunneling: No Undermining: No Wound Description Classification: Grade 1 Wound Margin: Flat and Intact Exudate Amount: Large Exudate Type: Serous Exudate Color: amber Foul Odor After Cleansing: No Slough/Fibrino No Wound Bed  Granulation Amount: Large (67-100%) Exposed Structure Granulation Quality: Red Fascia Exposed: No Necrotic Amount: None Present (0%) Fat Layer (Subcutaneous Tissue) Exposed: No Tendon Exposed: No Muscle Exposed: No Joint Exposed: No Bone Exposed: No Limited to Skin Breakdown Lochridge, Khaiden P. ([id number]) Periwound Skin Texture Texture Color No Abnormalities Noted: No No Abnormalities Noted: No Callus: No Atrophie Blanche: No Crepitus: No Cyanosis: No Excoriation: No Ecchymosis: No Induration: No Erythema: No Rash: No Hemosiderin Staining: No Scarring: No Mottled: No Pallor: No Moisture Rubor: No No Abnormalities Noted: No Dry / Scaly: No Temperature / Pain Maceration: No Temperature: No Abnormality Tenderness on  Palpation: Yes Wound Preparation Ulcer Cleansing: Rinsed/Irrigated with Saline Topical Anesthetic Applied: None Electronic Signature(s) Signed: 11/05/2017 4:20:38 PM By: Alejandro Mulling Entered By: Alejandro Mulling on 11/04/2017 14:13:33 Tacker, [name] ([id number]) -------------------------------------------------------------------------------- Wound Assessment Details Patient Name: Leason, Haitham P. Date of Service: 11/04/2017 1:00 PM Medical Record Number: [id number] Patient Account Number: [id number] Date of Birth/Sex: 04-Nov-1960 (57 y.o. M) Treating RN: Phillis Haggis Primary Care Keithan Dileonardo: Hillery Aldo Other Clinician: Referring Annalysa Mohammad: Hillery Aldo Treating Kyser Wandel/Extender: Linwood Dibbles, HOYT Weeks in Treatment: 13 Wound Status Wound Number: 24 Primary Diabetic Wound/Ulcer of the Lower Extremity Etiology: Wound Location: Right, Posterior Lower Leg Wound Healed - Epithelialized Wounding Event: Gradually Appeared Status: Date Acquired: 10/17/2017 Comorbid Lymphedema, Sleep Apnea, Arrhythmia, Deep Weeks Of Treatment: 2 History: Vein Thrombosis, Hypertension, Myocardial Clustered Wound: Yes Infarction, Type II Diabetes, Neuropathy Photos Photo Uploaded By: Renne Crigler on 11/04/2017 15:22:40 Wound Measurements Length: (cm) 0 Width: (cm) 0 Depth: (cm) 0 Area: (cm) 0 Volume: (cm) 0 % Reduction in Area: 100% % Reduction in Volume: 100% Epithelialization: None Tunneling: No Undermining: No Wound Description Classification: Grade 1 Wound Margin: Flat and Intact Exudate Amount: Large Exudate Type: Serous Exudate Color: amber Foul Odor After Cleansing: No Slough/Fibrino No Wound Bed Granulation Amount: Large (67-100%) Exposed Structure Granulation Quality: Red Fascia Exposed: No Necrotic Amount: None Present (0%) Fat Layer (Subcutaneous Tissue) Exposed: No Tendon Exposed: No Muscle Exposed: No Joint Exposed: No Bone Exposed: No Periwound Skin  Texture Steffek, Kyce P. ([id number]) Texture Color No Abnormalities Noted: No No Abnormalities Noted: No Callus: No Atrophie Blanche: No Crepitus: No Cyanosis: No Excoriation: No Ecchymosis: No Induration: No Erythema: No Rash: No Hemosiderin Staining: No Scarring: No Mottled: No Pallor: No Moisture Rubor: No No Abnormalities Noted: No Dry / Scaly: No Temperature / Pain Maceration: No Temperature: No Abnormality Tenderness on Palpation: Yes Wound Preparation Ulcer Cleansing: Rinsed/Irrigated with Saline Topical Anesthetic Applied: None Electronic Signature(s) Signed: 11/05/2017 4:20:38 PM By: Alejandro Mulling Entered By: Alejandro Mulling on 11/04/2017 14:10:29 Yard, [name] ([id number]) -------------------------------------------------------------------------------- Wound Assessment Details Patient Name: Gagen, Essam P. Date of Service: 11/04/2017 1:00 PM Medical Record Number: [id number] Patient Account Number: [id number] Date of Birth/Sex: Jun 22, 1961 (57 y.o. M) Treating RN: Phillis Haggis Primary Care Deyonte Cadden: Hillery Aldo Other Clinician: Referring Jakaylee Sasaki: Hillery Aldo Treating Seab Axel/Extender: Linwood Dibbles, HOYT Weeks in Treatment: 13 Wound Status Wound Number: 25 Primary Lymphedema Etiology: Wound Location: Left, Posterior Lower Leg Wound Healed - Epithelialized Wounding Event: Gradually Appeared Status: Date Acquired: 11/04/2017 Comorbid Lymphedema, Sleep Apnea, Arrhythmia, Deep Weeks Of Treatment: 0 History: Vein Thrombosis, Hypertension, Myocardial Clustered Wound: No Infarction, Type II Diabetes, Neuropathy Photos Photo Uploaded By: Renne Crigler on 11/04/2017 15:23:07 Wound Measurements Length: (cm) 0 Width: (cm) 0 Depth: (cm) 0 Area: (cm) 0 Volume: (cm) 0 % Reduction in Area: 100% % Reduction in Volume: 100% Epithelialization: Large (67-100%) Tunneling: No Undermining: No Wound Description  Classification: Partial Thickness Wound  Margin: Flat and Intact Exudate Amount: None Present Foul Odor After Cleansing: No Slough/Fibrino Yes Wound Bed Granulation Amount: None Present (0%) Exposed Structure Necrotic Amount: Large (67-100%) Fascia Exposed: No Necrotic Quality: Eschar Fat Layer (Subcutaneous Tissue) Exposed: No Tendon Exposed: No Muscle Exposed: No Joint Exposed: No Bone Exposed: No Periwound Skin Texture Texture Color No Abnormalities Noted: No No Abnormalities Noted: No Newkirk, Shaunte P. ([id number]) Callus: No Atrophie Blanche: No Crepitus: No Cyanosis: No Excoriation: No Ecchymosis: No Induration: No Erythema: No Rash: No Hemosiderin Staining: No Scarring: No Mottled: No Pallor: No Moisture Rubor: No No Abnormalities Noted: No Dry / Scaly: No Maceration: No Wound Preparation Ulcer Cleansing: Rinsed/Irrigated with Saline Topical Anesthetic Applied: None Electronic Signature(s) Signed: 11/05/2017 4:20:38 PM By: Alejandro Mulling Entered By: Alejandro Mulling on 11/04/2017 14:08:50 Bunton, [name] ([id number]) -------------------------------------------------------------------------------- Vitals Details Patient Name: Bocek, Addiel P. Date of Service: 11/04/2017 1:00 PM Medical Record Number: [id number] Patient Account Number: [id number] Date of Birth/Sex: 03-29-1961 (57 y.o. M) Treating RN: Renne Crigler Primary Care Akeema Broder: Hillery Aldo Other Clinician: Referring Klayten Jolliff: Hillery Aldo Treating Josphine Laffey/Extender: Linwood Dibbles, HOYT Weeks in Treatment: 13 Vital Signs Time Taken: 13:11 Temperature (F): 98.7 Height (in): 74 Pulse (bpm): 56 Weight (lbs): 310 Respiratory Rate (breaths/min): 18 Body Mass Index (BMI): 39.8 Blood Pressure (mmHg): 116/67 Reference Range: 80 - 120 mg / dl Electronic Signature(s) Signed: 11/04/2017 3:20:38 PM By: Renne Crigler Entered By: Renne Crigler on 11/04/2017 13:12:10

## 2017-11-06 NOTE — Progress Notes (Signed)
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 as the skin is concerned. Patient History Information obtained from Patient. Family History Cancer - Siblings, Heart Disease - Siblings,Father,Mother,Paternal Grandparents,Maternal Grandparents, Hypertension - Child,Siblings,Father,Paternal Grandparents,Maternal Grandparents, Tuberculosis - Maternal Grandparents, No family history of Diabetes, Hereditary Spherocytosis, Kidney Disease, Lung Disease, Seizures, Stroke, Thyroid Problems. Social History CHUE, BERKOVICH ([id number]) Never smoker, Marital Status - Divorced, Alcohol Use - Never, Drug Use - No History, Caffeine Use - Daily - coffee. Review of Systems (ROS) Constitutional Symptoms (General Health) Denies complaints or symptoms of Fever, Chills. Respiratory The patient has no complaints or symptoms. Cardiovascular Complains or has symptoms of LE edema. Psychiatric The patient has no complaints or symptoms. Objective Constitutional Well-nourished and well-hydrated in no acute distress. Vitals Time Taken: 1:11 PM, Height: 74 in, Weight: 310 lbs, BMI: 39.8, Temperature: 98.7 F, Pulse: 56 bpm, Respiratory Rate: 18 breaths/min, Blood Pressure: 116/67 mmHg. Respiratory normal breathing without difficulty. clear to  auscultation bilaterally. Cardiovascular regular rate and rhythm with normal S1, S2. 2+ pitting edema of the bilateral lower extremities. Psychiatric this patient is able to make decisions and demonstrates good insight into disease process. Alert and Oriented x 3. pleasant and cooperative. General Notes: Patient is having no pain in his swelling seems to be doing about as good as I've seen it up to this point. My suggestion currently is that he likely does need to protect the areas that are newly healed and continue with the wraps until he can get compression stockings from elastic therapy in [place]. Integumentary (Hair, Skin) Wound #21 status is Healed - Epithelialized. Original cause of wound was Gradually Appeared. The wound  is located on the Right Toe Great. The wound measures 0cm length x 0cm width x 0cm depth; 0cm^2 area and 0cm^3 volume. There is no tunneling or undermining noted. There is a none present amount of drainage noted. The wound margin is distinct with the outline attached to the wound base. There is no granulation within the wound bed. There is a large (67-100%) amount of necrotic tissue within the wound bed including Eschar. The periwound skin appearance did not exhibit: Callus, Crepitus, Excoriation, Induration, Rash, Scarring, Dry/Scaly, Maceration, Atrophie Blanche, Cyanosis, Ecchymosis, Hemosiderin Staining, Mottled, Pallor, Rubor, Erythema. Periwound temperature was noted as No Abnormality. Wound #22 status is Healed - Epithelialized. Original cause of wound was Gradually Appeared. The wound is located on the Left,Midline Lower Leg. The wound measures 0cm length x 0cm width x 0cm depth; 0cm^2 area and 0cm^3 volume. Wound #23 status is Healed - Epithelialized. Original cause of wound was Gradually Appeared. The wound is located on the Right,Midline Lower Leg. The wound measures 0cm length x 0cm width x 0cm depth; 0cm^2 area and 0cm^3 volume. The wound is limited to skin  breakdown. There is no tunneling or undermining noted. There is a large amount of serous drainage Finlay, Wyat P. ([id number]) noted. The wound margin is flat and intact. There is large (67-100%) red granulation within the wound bed. There is no necrotic tissue within the wound bed. The periwound skin appearance did not exhibit: Callus, Crepitus, Excoriation, Induration, Rash, Scarring, Dry/Scaly, Maceration, Atrophie Blanche, Cyanosis, Ecchymosis, Hemosiderin Staining, Mottled, Pallor, Rubor, Erythema. Periwound temperature was noted as No Abnormality. The periwound has tenderness on palpation. Wound #24 status is Healed - Epithelialized. Original cause of wound was Gradually Appeared. The wound is located on the Right,Posterior Lower Leg. The wound measures 0cm length x 0cm width x 0cm depth; 0cm^2 area and 0cm^3 volume. There is no tunneling or undermining noted. There is a large amount of serous drainage noted. The wound margin is flat and intact. There is large (67-100%) red granulation within the wound bed. There is no necrotic tissue within the wound bed. The periwound skin appearance did not exhibit: Callus, Crepitus, Excoriation, Induration, Rash, Scarring, Dry/Scaly, Maceration, Atrophie Blanche, Cyanosis, Ecchymosis, Hemosiderin Staining, Mottled, Pallor, Rubor, Erythema. Periwound temperature was noted as No Abnormality. The periwound has tenderness on palpation. Wound #25 status is Healed - Epithelialized. Original cause of wound was Gradually Appeared. The wound is located on the Left,Posterior Lower Leg. The wound measures 0cm length x 0cm width x 0cm depth; 0cm^2 area and 0cm^3 volume. There is no tunneling or undermining noted. There is a none present amount of drainage noted. The wound margin is flat and intact. There is no granulation within the wound bed. There is a large (67-100%) amount of necrotic tissue within the wound bed including Eschar. The periwound skin appearance did  not exhibit: Callus, Crepitus, Excoriation, Induration, Rash, Scarring, Dry/Scaly, Maceration, Atrophie Blanche, Cyanosis, Ecchymosis, Hemosiderin Staining, Mottled, Pallor, Rubor, Erythema. 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 Cleanse wound with mild  soap and water Primary Wound Dressing: Mepitel One Contact layer - on newly healed areas Secondary Dressing: ABD pad Dressing Change Frequency: Change dressing every week Follow-up Appointments: Return Appointment in 1 week. Edema Control: [employer], Argil P. ([id number]) Elevate legs to the level of the heart and pump ankles as often as possible Other: - pt to get own compression stockings Additional Orders / Instructions: Increase protein intake. I'm gonna suggest currently that we continue to wrap his legs for the next week until he receives the compression stockings from elastic therapy. I'm gonna also recommend mepitel over the newly healed areas to prevent any damage to this new skin. Please see above for specific wound care orders. We will see patient for re-evaluation in 1 week(s) here in the clinic. If anything worsens or changes patient will contact our office for additional recommendations. Electronic Signature(s) Signed: 11/05/2017 12:50:15 AM By: Lenda Kelp PA-C Entered By: Lenda Kelp on 11/05/2017 00:06:25 Dowding, [name]  ([id number]) -------------------------------------------------------------------------------- ROS/PFSH Details Patient Name: [name]. Date of Service: 11/04/2017 1:00 PM Medical Record Number: [id number] Patient Account Number: [id number] Date of Birth/Sex: [birth year] (57 y.o. M) Treating RN: Phillis Haggis Primary Care Provider: Hillery Aldo Other Clinician: Referring Provider: Hillery Aldo Treating Provider/Extender: Linwood Dibbles, HOYT Weeks in Treatment: 13 Information Obtained From Patient Wound History Do you currently have one or more open woundso Yes How many open wounds do you currently haveo 5 Approximately how long have you had your woundso several months How have you been treating your wound(s) until nowo doxycycline for cellulitis Has your wound(s) ever healed and then re-openedo No Have you had any lab work done in the past montho No Have you tested positive for an antibiotic resistant organism (MRSA, VRE)o Yes Have you tested positive for osteomyelitis (bone infection)o No Have you had any tests for circulation on your legso Yes Who ordered the testo cone 5 years ago Constitutional Symptoms (General Health) Complaints and Symptoms: Negative for: Fever; Chills Cardiovascular Complaints and Symptoms: Positive for: LE edema Medical History: Positive for: Arrhythmia - afib; Deep Vein Thrombosis; Hypertension; Myocardial Infarction Negative for: Angina; Congestive Heart Failure; Coronary Artery Disease; Hypotension; Peripheral Arterial Disease; Peripheral Venous Disease; Phlebitis Eyes Medical History: Negative for: Cataracts; Glaucoma; Optic Neuritis Ear/Nose/Mouth/Throat Medical History: Negative for: Chronic sinus problems/congestion; Middle ear problems Hematologic/Lymphatic Medical History: Positive for: Lymphedema Negative for: Anemia; Hemophilia; Human Immunodeficiency Virus; Sickle Cell Disease Respiratory Complaints and Symptoms: No Complaints or  Symptoms Brunkhorst, Paulmichael P. ([id number]) Medical History: Positive for: Sleep Apnea - cpap and ventalator for trach Negative for: Aspiration; Asthma; Chronic Obstructive Pulmonary Disease (COPD); Pneumothorax; Tuberculosis Gastrointestinal Medical History: Negative for: Cirrhosis ; Colitis; Crohnos; Hepatitis A; Hepatitis B; Hepatitis C Endocrine Medical History: Positive for: Type II Diabetes Time with diabetes: 6 years Treated with: Insulin Blood sugar tested every day: No Blood sugar testing results: Bedtime: 132 Genitourinary Medical History: Negative for: End Stage Renal Disease Immunological Medical History: Negative for: Lupus Erythematosus; Raynaudos; Scleroderma Musculoskeletal Medical History: Negative for: Gout; Rheumatoid Arthritis; Osteoarthritis; Osteomyelitis Neurologic Medical History: Positive for: Neuropathy Negative for: Quadriplegia; Paraplegia; Seizure Disorder Oncologic Medical History: Negative for: Received Chemotherapy; Received Radiation Psychiatric Complaints and Symptoms: No Complaints or Symptoms Medical History: Negative for: Anorexia/bulimia; Confinement Anxiety Immunizations Pneumococcal Vaccine: Received Pneumococcal Vaccination: Yes Implantable Devices Zadrozny, Zackaria P. ([id number]) Family and Social History Cancer: Yes - Siblings; Diabetes: No; Heart Disease: Yes - Siblings,Father,Mother,Paternal Grandparents,Maternal Grandparents; Hereditary Spherocytosis: No; Hypertension: Yes - Child,Siblings,Father,Paternal Grandparents,Maternal Grandparents;  Kidney Disease: No; Lung Disease: No; Seizures: No; Stroke: No; Thyroid Problems: No; Tuberculosis: Yes - Maternal Grandparents; Never smoker; Marital Status - Divorced; Alcohol Use: Never; Drug Use: No History; Caffeine Use: Daily - coffee; Financial Concerns: No; Food, Clothing or Shelter Needs: No; Support System Lacking: No; Transportation Concerns: No; Advanced Directives: No; Patient does not  want information on Advanced Directives; Living Will: No Physician Affirmation I have reviewed and agree with the above information. Electronic Signature(s) Signed: 11/05/2017 12:50:15 AM By: Lenda Kelp PA-C Signed: 11/05/2017 4:20:38 PM By: Alejandro Mulling Entered By: Lenda Kelp on 11/05/2017 00:05:47 Delcarlo, [name] ([id number]) -------------------------------------------------------------------------------- SuperBill Details Patient Name: Hurn, Advay P. Date of Service: 11/04/2017 Medical Record Number: [id number] Patient Account Number: [id number] Date of Birth/Sex: [birth year] (57 y.o. M) Treating RN: Phillis Haggis Primary Care Provider: Hillery Aldo Other Clinician: Referring Provider: Hillery Aldo Treating Provider/Extender: Linwood Dibbles, HOYT Weeks in Treatment: 13 Diagnosis Coding ICD-10 Codes Code Description [phone] Non-pressure chronic ulcer of other part of right foot with fat layer exposed E11.621 Type 2 diabetes mellitus with foot ulcer I89.0 Lymphedema, not elsewhere classified E11.622 Type 2 diabetes mellitus with other skin ulcer I50.42 Chronic combined systolic (congestive) and diastolic (congestive) heart failure I48.2 Chronic atrial fibrillation S60.321S Blister (nonthermal) of right thumb, sequela S60.420S Blister (nonthermal) of right index finger, sequela R60.9 Edema, unspecified Facility Procedures CPT4 Code: [id number] Description: 29580 - APPLY UNNA BOOT/PROFO BILATERAL Modifier: Quantity: 1 Physician Procedures CPT4 Code: [id number] Description: 99213 - WC PHYS LEVEL 3 - EST PT ICD-10 Diagnosis Description L97.512 Non-pressure chronic ulcer of other part of right foot with f E11.621 Type 2 diabetes mellitus with foot ulcer I89.0 Lymphedema, not elsewhere classified E11.622 Type 2  diabetes mellitus with other skin ulcer Modifier: at layer exposed Quantity: 1 Electronic Signature(s) Signed: 11/05/2017 12:50:15 AM By: Lenda Kelp PA-C Previous  Signature: 11/04/2017 3:44:41 PM Version By: Alejandro Mulling Entered By: Lenda Kelp on 11/05/2017 00:06:49

## 2017-11-11 ENCOUNTER — Ambulatory Visit: Payer: Medicare HMO | Admitting: Physician Assistant

## 2017-11-12 ENCOUNTER — Encounter (INDEPENDENT_AMBULATORY_CARE_PROVIDER_SITE_OTHER): Payer: Self-pay

## 2017-11-18 ENCOUNTER — Encounter: Payer: Medicare HMO | Admitting: Physician Assistant

## 2017-11-18 DIAGNOSIS — E11621 Type 2 diabetes mellitus with foot ulcer: Secondary | ICD-10-CM | POA: Diagnosis not present

## 2017-11-19 ENCOUNTER — Other Ambulatory Visit: Payer: Self-pay | Admitting: Family Medicine

## 2017-11-19 DIAGNOSIS — R0600 Dyspnea, unspecified: Secondary | ICD-10-CM

## 2017-11-20 ENCOUNTER — Ambulatory Visit
Admission: RE | Admit: 2017-11-20 | Discharge: 2017-11-20 | Disposition: A | Discharge status: Home / Self Care | Payer: Medicare HMO | Source: Ambulatory Visit | Attending: Family Medicine | Admitting: Family Medicine

## 2017-11-20 DIAGNOSIS — K117 Disturbances of salivary secretion: Secondary | ICD-10-CM | POA: Insufficient documentation

## 2017-11-20 DIAGNOSIS — R0600 Dyspnea, unspecified: Secondary | ICD-10-CM | POA: Insufficient documentation

## 2017-11-21 ENCOUNTER — Ambulatory Visit: Payer: Medicare HMO | Admitting: Physician Assistant

## 2017-11-23 NOTE — Progress Notes (Signed)
MANSFIELD, DANN ([id number]) Visit Report for 11/18/2017 Arrival Information Details Patient Name: [name], [name]. Date of Service: 11/18/2017 2:15 PM Medical Record Number: [id number] Patient Account Number: [id number] Date of Birth/Sex: Nov 24, 1960 (57 y.o. M) Treating RN: Curtis Sites Primary Care Bevin Das: Hillery Aldo Other Clinician: Referring Camille Dragan: Hillery Aldo Treating Jaxyn Mestas/Extender: Linwood Dibbles, HOYT Weeks in Treatment: 15 Visit Information History Since Last Visit Added or deleted any medications: No Patient Arrived: Ambulatory Any new allergies or adverse reactions: No Arrival Time: 14:53 Had a fall or experienced change in No Accompanied By: self activities of daily living that may affect Transfer Assistance: None risk of falls: Patient Identification Verified: Yes Signs or symptoms of abuse/neglect since last visito No Secondary Verification Process Yes Hospitalized since last visit: No Completed: Implantable device outside of the clinic excluding No Patient Requires Transmission-Based No cellular tissue based products placed in the center Precautions: since last visit: Patient Has Alerts: Yes Has Dressing in Place as Prescribed: Yes Patient Alerts: noncompressible Has Compression in Place as Prescribed: Yes bilateral Pain Present Now: No Electronic Signature(s) Signed: 11/18/2017 5:40:20 PM By: Curtis Sites Entered By: Curtis Sites on 11/18/2017 14:53:52 Shapley, [name] ([id number]) -------------------------------------------------------------------------------- Clinic Level of Care Assessment Details Patient Name: [name]. Date of Service: 11/18/2017 2:15 PM Medical Record Number: [id number] Patient Account Number: [id number] Date of Birth/Sex: 1960-09-07 (56 y.o. M) Treating RN: Phillis Haggis Primary Care Ranika Mcniel: Hillery Aldo Other Clinician: Referring Muskaan Smet: Hillery Aldo Treating Christy Friede/Extender: Linwood Dibbles, HOYT Weeks in Treatment:  15 Clinic Level of Care Assessment Items TOOL 4 Quantity Score X - Use when only an EandM is performed on FOLLOW-UP visit 1 0 ASSESSMENTS - Nursing Assessment / Reassessment X - Reassessment of Co-morbidities (includes updates in patient status) 1 10 X- 1 5 Reassessment of Adherence to Treatment Plan ASSESSMENTS - Wound and Skin Assessment / Reassessment []  - Simple Wound Assessment / Reassessment - one wound 0 []  - 0 Complex Wound Assessment / Reassessment - multiple wounds []  - 0 Dermatologic / Skin Assessment (not related to wound area) ASSESSMENTS - Focused Assessment []  - Circumferential Edema Measurements - multi extremities 0 []  - 0 Nutritional Assessment / Counseling / Intervention []  - 0 Lower Extremity Assessment (monofilament, tuning fork, pulses) []  - 0 Peripheral Arterial Disease Assessment (using hand held doppler) ASSESSMENTS - Ostomy and/or Continence Assessment and Care []  - Incontinence Assessment and Management 0 []  - 0 Ostomy Care Assessment and Management (repouching, etc.) PROCESS - Coordination of Care X - Simple Patient / Family Education for ongoing care 1 15 []  - 0 Complex (extensive) Patient / Family Education for ongoing care []  - 0 Staff obtains Chiropractor, Records, Test Results / Process Orders []  - 0 Staff telephones HHA, Nursing Homes / Clarify orders / etc []  - 0 Routine Transfer to another Facility (non-emergent condition) []  - 0 Routine Hospital Admission (non-emergent condition) []  - 0 New Admissions / Manufacturing engineer / Ordering NPWT, Apligraf, etc. []  - 0 Emergency Hospital Admission (emergent condition) X- 1 10 Simple Discharge Coordination Cullars, Ireland P. ([id number]) []  - 0 Complex (extensive) Discharge Coordination PROCESS - Special Needs []  - Pediatric / Minor Patient Management 0 []  - 0 Isolation Patient Management []  - 0 Hearing / Language / Visual special needs []  - 0 Assessment of Community assistance  (transportation, D/C planning, etc.) []  - 0 Additional assistance / Altered mentation []  - 0 Support Surface(s) Assessment (bed, cushion, seat, etc.) INTERVENTIONS - Wound Cleansing / Measurement X - Simple Wound  Cleansing - one wound 1 5 []  - 0 Complex Wound Cleansing - multiple wounds X- 1 5 Wound Imaging (photographs - any number of wounds) []  - 0 Wound Tracing (instead of photographs) []  - 0 Simple Wound Measurement - one wound []  - 0 Complex Wound Measurement - multiple wounds INTERVENTIONS - Wound Dressings []  - Small Wound Dressing one or multiple wounds 0 []  - 0 Medium Wound Dressing one or multiple wounds []  - 0 Large Wound Dressing one or multiple wounds []  - 0 Application of Medications - topical []  - 0 Application of Medications - injection INTERVENTIONS - Miscellaneous []  - External ear exam 0 []  - 0 Specimen Collection (cultures, biopsies, blood, body fluids, etc.) []  - 0 Specimen(s) / Culture(s) sent or taken to Lab for analysis []  - 0 Patient Transfer (multiple staff / Nurse, adultHoyer Lift / Similar devices) []  - 0 Simple Staple / Suture removal (25 or less) []  - 0 Complex Staple / Suture removal (26 or more) []  - 0 Hypo / Hyperglycemic Management (close monitor of Blood Glucose) []  - 0 Ankle / Brachial Index (ABI) - do not check if billed separately X- 1 5 Vital Signs Cuadros, Cohan P. ([id number]) Has the patient been seen at the hospital within the last three years: Yes Total Score: 55 Level Of Care: New/Established - Level 2 Electronic Signature(s) Signed: 11/21/2017 5:38:20 PM By: Alejandro MullingPinkerton, Debra Entered By: Alejandro MullingPinkerton, Debra on 11/18/2017 17:42:30 Castilleja, Laren EvertsJIM P. ([id number]) -------------------------------------------------------------------------------- Encounter Discharge Information Details Patient Name: [name], [name] Date of Service: 11/18/2017 2:15 PM Medical Record Number: [id number] Patient Account Number: [id number] Date of Birth/Sex: 08-06-1960  (57 y.o. M) Treating RN: Phillis HaggisPinkerton, Debi Primary Care Rylynn Kobs: Hillery AldoPATEL, SARAH Other Clinician: Referring Parrish Bonn: Hillery AldoPATEL, SARAH Treating Kymia Simi/Extender: Linwood DibblesSTONE III, HOYT Weeks in Treatment: 15 Encounter Discharge Information Items Discharge Condition: Stable Ambulatory Status: Ambulatory Discharge Destination: Home Transportation: Private Auto Accompanied By: self Schedule Follow-up Appointment: No Clinical Summary of Care: Electronic Signature(s) Signed: 11/21/2017 5:38:20 PM By: Alejandro MullingPinkerton, Debra Entered By: Alejandro MullingPinkerton, Debra on 11/18/2017 15:50:59 Wittner, Laren EvertsJIM P. ([id number]) -------------------------------------------------------------------------------- Lower Extremity Assessment Details Patient Name: Lorimer, Kyce P. Date of Service: 11/18/2017 2:15 PM Medical Record Number: [id number] Patient Account Number: [id number] Date of Birth/Sex: 08-06-1960 (57 y.o. M) Treating RN: Curtis Sitesorthy, Joanna Primary Care Blythe Hartshorn: Hillery AldoPATEL, SARAH Other Clinician: Referring Devun Anna: Hillery AldoPATEL, SARAH Treating Camren Henthorn/Extender: Linwood DibblesSTONE III, HOYT Weeks in Treatment: 15 Edema Assessment Assessed: [Left: No] [Right: No] [Left: Edema] [Right: :] Calf Left: Right: Point of Measurement: 38 cm From Medial Instep 44.3 cm 47.5 cm Ankle Left: Right: Point of Measurement: 14 cm From Medial Instep 34.2 cm 32 cm Vascular Assessment Pulses: Dorsalis Pedis Palpable: [Left:Yes] [Right:Yes] Posterior Tibial Extremity colors, hair growth, and conditions: Extremity Color: [Left:Hyperpigmented] [Right:Hyperpigmented] Hair Growth on Extremity: [Left:No] [Right:No] Temperature of Extremity: [Left:Warm] [Right:Warm] Capillary Refill: [Left:< 3 seconds] [Right:< 3 seconds] Toe Nail Assessment Left: Right: Thick: Yes Yes Discolored: Yes Yes Deformed: Yes Yes Improper Length and Hygiene: Yes Yes Electronic Signature(s) Signed: 11/18/2017 5:40:20 PM By: Curtis Sitesorthy, Joanna Entered By: Curtis Sitesorthy, Joanna on 11/18/2017  15:04:18 Egge, Laren EvertsJIM P. ([id number]) -------------------------------------------------------------------------------- Multi Wound Chart Details Patient Name: Tetterton, Bradon P. Date of Service: 11/18/2017 2:15 PM Medical Record Number: [id number] Patient Account Number: [id number] Date of Birth/Sex: 08-06-1960 (57 y.o. M) Treating RN: Phillis HaggisPinkerton, Debi Primary Care Niles Ess: Hillery AldoPATEL, SARAH Other Clinician: Referring Issabella Rix: Hillery AldoPATEL, SARAH Treating Jenevieve Kirschbaum/Extender: Linwood DibblesSTONE III, HOYT Weeks in Treatment: 15 Vital Signs Height(in): 74 Pulse(bpm): 50 Weight(lbs): 310 Blood Pressure(mmHg): 93/72 Body Mass Index(BMI): 40 Temperature(F):  98.1 Respiratory Rate 18 (breaths/min): Wound Assessments Treatment Notes Electronic Signature(s) Signed: 11/21/2017 5:38:20 PM By: Alejandro Mulling Entered By: Alejandro Mulling on 11/18/2017 15:47:17 Jafri, [name] ([id number]) -------------------------------------------------------------------------------- Multi-Disciplinary Care Plan Details Patient Name: Kindig, Satya P. Date of Service: 11/18/2017 2:15 PM Medical Record Number: [id number] Patient Account Number: [id number] Date of Birth/Sex: 1960-11-17 (57 y.o. M) Treating RN: Phillis Haggis Primary Care Floella Ensz: Hillery Aldo Other Clinician: Referring Brick Ketcher: Hillery Aldo Treating Ivorie Uplinger/Extender: Linwood Dibbles, HOYT Weeks in Treatment: 15 Active Inactive Electronic Signature(s) Signed: 11/21/2017 5:38:20 PM By: Alejandro Mulling Entered By: Alejandro Mulling on 11/18/2017 15:52:37 Bartley, [name] ([id number]) -------------------------------------------------------------------------------- Pain Assessment Details Patient Name: Funes, Pace P. Date of Service: 11/18/2017 2:15 PM Medical Record Number: [id number] Patient Account Number: [id number] Date of Birth/Sex: 1960-10-09 (58 y.o. M) Treating RN: Curtis Sites Primary Care Hortense Cantrall: Hillery Aldo Other Clinician: Referring Travis Purk: Hillery Aldo Treating Teosha Casso/Extender: Linwood Dibbles, HOYT Weeks in Treatment: 15 Active Problems Location of Pain Severity and Description of Pain Patient Has Paino No Site Locations Pain Management and Medication Current Pain Management: Electronic Signature(s) Signed: 11/18/2017 5:40:20 PM By: Curtis Sites Entered By: Curtis Sites on 11/18/2017 14:55:16 Jemison, [name] ([id number]) -------------------------------------------------------------------------------- Patient/Caregiver Education Details Patient Name: [name]. Date of Service: 11/18/2017 2:15 PM Medical Record Number: [id number] Patient Account Number: [id number] Date of Birth/Gender: 1960/10/28 (57 y.o. M) Treating RN: Phillis Haggis Primary Care Physician: Hillery Aldo Other Clinician: Referring Physician: Hillery Aldo Treating Physician/Extender: Skeet Simmer in Treatment: 15 Education Assessment Education Provided To: Patient Education Topics Provided Wound/Skin Impairment: Handouts: Other: Please get own compression socks ASAP. Please call our office if you have any questions / concerns Methods: Explain/Verbal Responses: State content correctly Electronic Signature(s) Signed: 11/21/2017 5:38:20 PM By: Alejandro Mulling Entered By: Alejandro Mulling on 11/18/2017 15:51:31 Kukla, [name] ([id number]) -------------------------------------------------------------------------------- Vitals Details Patient Name: Dinges, Blanton P. Date of Service: 11/18/2017 2:15 PM Medical Record Number: [id number] Patient Account Number: [id number] Date of Birth/Sex: 1960/12/21 (57 y.o. M) Treating RN: Curtis Sites Primary Care Dewey Viens: Hillery Aldo Other Clinician: Referring Perseus Westall: Hillery Aldo Treating Issai Werling/Extender: Linwood Dibbles, HOYT Weeks in Treatment: 15 Vital Signs Time Taken: 14:55 Temperature (F): 98.1 Height (in): 74 Pulse (bpm): 50 Weight (lbs): 310 Respiratory Rate (breaths/min): 18 Body Mass Index  (BMI): 39.8 Blood Pressure (mmHg): 93/72 Reference Range: 80 - 120 mg / dl Electronic Signature(s) Signed: 11/18/2017 5:40:20 PM By: Curtis Sites Entered By: Curtis Sites on 11/18/2017 14:55:46

## 2017-11-23 NOTE — Progress Notes (Signed)
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 was necessary. Currently he states he seems to be feeling well is lower extremities look well he has no open wounds although he has several areas of newly healed wound sites that are fragile as far as the skin is concerned. 11/18/17 on evaluation today patient actually appears to be doing very well. His legs are completely healed he has no open areas which is great news. With that being said he still does have swelling obviously I think he needs compression unfortunately he has not ordered his compression as of yet despite multiple attempts on my behalf at trying to get him to go ahead and ordered this to have it ready. Fortunately he has no signs of cellulitis this has also been an issue for him. Patient History Social History Never smoker. [name], [name] ([id number]) Medical History Eyes Patient has history of Cataracts, Glaucoma, Optic Neuritis Ear/Nose/Mouth/Throat Patient has history of Chronic sinus problems/congestion, Middle ear problems Hematologic/Lymphatic Patient has history of Anemia, Hemophilia, Human Immunodeficiency Virus, Sickle Cell Disease Respiratory Patient has history of Aspiration, Asthma, Chronic Obstructive Pulmonary Disease (COPD), Pneumothorax, Tuberculosis Cardiovascular Patient has history of Angina Review of Systems (ROS) Constitutional  Symptoms (General Health) Denies complaints or symptoms of Fever, Chills. Respiratory The patient has no complaints or symptoms. Cardiovascular Complains or has symptoms of LE edema. Psychiatric The patient has no complaints or symptoms. Objective Constitutional Well-nourished and well-hydrated in no acute distress. Vitals Time Taken: 2:55 PM, Height: 74 in, Weight: 310 lbs, BMI: 39.8, Temperature: 98.1 F, Pulse: 50 bpm, Respiratory Rate: 18 breaths/min, Blood Pressure: 93/72 mmHg. Respiratory normal breathing without difficulty. clear to auscultation bilaterally. Cardiovascular regular rate and  rhythm with normal S1, S2. 1+ pitting edema of the bilateral lower extremities. Psychiatric this patient is able to make decisions and demonstrates good insight into disease process. Alert and Oriented x 3. pleasant and cooperative. General Notes: At this point I'm going to suggest that we continue with compression he needs to have compression stockings although he is not ordered them yet I devised in these do this as soon as possible. With that being said we did put tubigrip on him today. 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 Please get own compression socks ASAP. Please call our office if you have any questions or concerns. I'm going to suggest that he get  compression stockings as soon as possible we given a measurements as well as contact information for elastic therapy in [place] multiple times. I do believe he has what he needs in order to get these ordered he just needs to do it. Subsequently we will discharge him from wound care services at this point since he seems to be doing well. Patient is happy about this. We will see him in the future as needed depending on how things progress. Again I stressed to him that he absolutely needs to get compression therapy however. Electronic Signature(s) Signed: 11/19/2017 8:29:14 AM By: Lenda Kelp PA-C Entered By: Lenda Kelp on 11/18/2017 15:53:33 Riedlinger, [name] ([id number]) -------------------------------------------------------------------------------- ROS/PFSH Details Patient Name: [name]. Date of Service: 11/18/2017 2:15 PM Medical Record Number: [id number] Patient Account Number: [id number] Date of Birth/Sex: July 30, 1960 (57 y.o. M) Treating RN: Phillis Haggis Primary Care Provider: Hillery Aldo Other Clinician: Referring Provider: Hillery Aldo Treating Provider/Extender: Linwood Dibbles, HOYT Weeks in Treatment: 15 Wound History Constitutional Symptoms (General Health) Complaints and Symptoms: Negative for: Fever; Chills Cardiovascular Complaints and Symptoms: Positive for: LE edema Medical History: Positive for: Angina; Arrhythmia - afib Eyes Medical History: Positive for: Cataracts; Glaucoma; Optic Neuritis Ear/Nose/Mouth/Throat Medical History: Positive for: Chronic sinus problems/congestion; Middle ear problems Hematologic/Lymphatic Medical History: Positive for: Anemia; Hemophilia; Human Immunodeficiency Virus; Lymphedema; Sickle Cell Disease Respiratory Complaints and Symptoms: No Complaints or Symptoms Medical History: Positive for: Aspiration; Asthma; Chronic Obstructive Pulmonary Disease (COPD); Pneumothorax; Sleep Apnea - cpap and ventalator for trach;  Tuberculosis Psychiatric Complaints and Symptoms: No Complaints or Symptoms HBO Extended History Items Ear/Nose/Mouth/Throat: Eyes: Eyes: Ear/Nose/Mouth/Throat: Chronic sinus Cataracts Glaucoma Middle ear problems problems/congestion Implantable Devices Leaf, SOPHIA CUBERO. ([id number]) Family and Social History Never smoker Physician Affirmation I have reviewed and agree with the above information. Electronic Signature(s) Signed: 11/19/2017 8:29:14 AM By: Lenda Kelp PA-C Signed: 11/21/2017 5:38:20 PM By: Alejandro Mulling Entered By: Lenda Kelp on 11/18/2017 15:52:37 Nardelli, [name] ([id number]) -------------------------------------------------------------------------------- SuperBill Details Patient Name: Winebarger, Raquel P. Date of Service: 11/18/2017 Medical Record Number: [id number] Patient Account Number: [id number] Date of Birth/Sex: [birth year] (57 y.o. M) Treating RN: Phillis Haggis Primary Care Provider: Hillery Aldo Other Clinician: Referring Provider: Hillery Aldo Treating Provider/Extender: Linwood Dibbles, HOYT Weeks in Treatment: 15  Diagnosis Coding ICD-10 Codes Code Description L97.512 Non-pressure chronic ulcer of other part of right foot with fat layer exposed E11.621 Type 2 diabetes mellitus with foot ulcer I89.0 Lymphedema, not elsewhere classified E11.622 Type 2 diabetes mellitus with other skin ulcer I50.42 Chronic combined systolic (congestive) and diastolic (congestive) heart failure I48.2 Chronic atrial fibrillation S60.321S Blister (nonthermal) of right thumb, sequela S60.420S Blister (nonthermal) of right index finger, sequela R60.9 Edema, unspecified Facility Procedures CPT4 Code: [id number] Description: [id number] - WOUND CARE VISIT-LEV 2 EST PT Modifier: Quantity: 1 Physician Procedures CPT4 Code: [id number] Description: [id number] - WC PHYS LEVEL 2 - EST PT ICD-10 Diagnosis Description L97.512 Non-pressure chronic ulcer of other part of right foot with f I89.0  Lymphedema, not elsewhere classified E11.621 Type 2 diabetes mellitus with foot ulcer E11.622 Type 2  diabetes mellitus with other skin ulcer Modifier: at layer exposed Quantity: 1 Electronic Signature(s) Signed: 11/18/2017 5:42:53 PM By: Alejandro MullingPinkerton, Debra Signed: 11/19/2017 8:29:14 AM By: Lenda KelpStone III, Hoyt PA-C Entered By: Alejandro MullingPinkerton, Debra on 11/18/2017 17:42:53

## 2017-12-09 ENCOUNTER — Ambulatory Visit (INDEPENDENT_AMBULATORY_CARE_PROVIDER_SITE_OTHER): Payer: Medicare HMO | Admitting: Internal Medicine

## 2017-12-09 ENCOUNTER — Encounter: Payer: Self-pay | Admitting: Internal Medicine

## 2017-12-09 VITALS — BP 116/82 | HR 56 | Ht 74.0 in | Wt 314.0 lb

## 2017-12-09 DIAGNOSIS — J9610 Chronic respiratory failure, unspecified whether with hypoxia or hypercapnia: Secondary | ICD-10-CM | POA: Diagnosis not present

## 2017-12-09 MED ORDER — IPRATROPIUM-ALBUTEROL 0.5-2.5 (3) MG/3ML IN SOLN: 3.0000 mL | Freq: Three times a day (TID) | RESPIRATORY_TRACT | 1 refills | Status: AC

## 2017-12-09 MED ORDER — PREDNISONE 10 MG (21) PO TBPK: ORAL_TABLET | ORAL | 0 refills | Status: DC

## 2017-12-09 MED ORDER — AMBULATORY NON FORMULARY MEDICATION: 0 refills | Status: AC

## 2017-12-09 NOTE — Patient Instructions (Signed)
Will send in prednisone taper and prescription for nebulizer machine and meds.   Use nebulizer 3 to 4 times daily until you feel better, then use as needed.

## 2017-12-09 NOTE — Progress Notes (Signed)
[facility]* [facility]     Assessment and Plan:  Acute bronchitis -Patient is not currently on any inhaled medications due to presence of tracheostomy. -We will start patient on a prednisone taper, as well as a course of nebulized medication to be used 3-4 times daily until he is feeling better, at that time he can change to as needed.   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 Acute dyspnea --Continue current measures, continue nocturnal ventilation.   Left endobronchial lesion.  --Status post bronchoscopy 07/28/2017, left endobronchial lesion, biopsy negative for malignancy. - [facility]erred [facility] for [facility].     Date: 12/09/2017  MRN# [id number] [name] 07/31/1960   [name] is a 57 y.o. old male seen in follow up for chief complaint of  Chief Complaint  Patient presents with  . Follow-up  . Shortness of Breath    feels constricted: chest tightness; prod cough w/thick clear mucus:     HPI:  The patient is a 57 year old male with chronic hypoxic rotatory failure, tracheostomy.  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  He has a history of hyponatremia thought to be secondary to be reported mania.  He had a previous CT chest in May 2018 which was suggestive of an endobronchial lesion.  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  He was subsequently referred [facility] management and possible resection. 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 He was in the pre-op twice, and is now going back again in about 2 weeks.   He comes back in today as he  got sick about 6 weeks ago, he went to his PCP, he had blood work and culture which was negative. He got a course of bactrim and seemed to improve. About last week he again started having trouble with dyspnea again walking around his home. He gets dyspnea from walking in from the driveway.   He is on no inhaler of nebulizers.     Medication:    Current Outpatient Medications:  .  acetaminophen (TYLENOL) 325 MG tablet, Take 1 tablet (325 mg total) by mouth every 6 (six) hours as needed for mild pain (or Fever >/= 101)., Disp: , Rfl:  .  ALPRAZolam (XANAX) 0.5 MG tablet, Take 1 tablet (0.5 mg total) by mouth every 8 (eight) hours as needed for anxiety., Disp: 20 tablet, Rfl: 0 .  amiodarone (PACERONE) 200 MG tablet, Take 200 mg by mouth 2 (two) times daily., Disp: , Rfl: 8 .  atorvastatin (LIPITOR) 40 MG tablet, Take 1 tablet (40 mg total) by mouth daily at 6 PM. (Patient not taking: Reported on 10/07/2017), Disp: 30 tablet, Rfl: 1 .  atorvastatin (LIPITOR) 80 MG tablet, Take 80 mg by mouth at bedtime., Disp: , Rfl:  .  busPIRone (BUSPAR) 10 MG tablet, Take 10 mg by mouth 2 (two) times daily as needed. , Disp: , Rfl:  .  carbamazepine (TEGRETOL) 200 MG tablet, Take 2 tablets (400 mg total) by mouth daily at 10 pm. (Patient not taking: Reported on 07/17/2017), Disp: 30 tablet, Rfl: 0 .  collagenase (SANTYL) ointment, Apply topically daily., Disp: 15 g, Rfl: 0 .  enalapril (VASOTEC)  2.5 MG tablet, Take 1 tablet (2.5 mg total) by mouth daily., Disp: 30 tablet, Rfl: 0 .  feeding supplement, GLUCERNA SHAKE, (GLUCERNA SHAKE) LIQD, Take 237 mLs by mouth 2 (two) times daily between meals., Disp: 60 Can, Rfl: 0 .  folic acid (FOLVITE) 1 MG tablet, Take 1 tablet (1 mg total) by mouth daily., Disp: 30 tablet, Rfl: 0 .  furosemide (LASIX) 40 MG tablet, Take 1 tablet (40 mg total) by mouth daily., Disp: 30 tablet, Rfl: 0 .  gabapentin (NEURONTIN) 800 MG tablet, Take 1 tablet (800 mg total) by mouth 3 (three)  times daily. 1 tablet twice daily (Patient taking differently: Take 1,600 mg by mouth 2 (two) times daily. ), Disp: 60 tablet, Rfl: 0 .  insulin detemir (LEVEMIR) 100 unit/ml SOLN, Inject 20 Units into the skin 2 (two) times daily. , Disp: , Rfl:  .  insulin lispro (HUMALOG) 100 UNIT/ML injection, Inject 2-10 Units into the skin 3 (three) times daily with meals. , Disp: , Rfl:  .  lamoTRIgine (LAMICTAL) 150 MG tablet, Take 150 mg by mouth 2 (two) times daily. , Disp: , Rfl:  .  metFORMIN (GLUCOPHAGE-XR) 500 MG 24 hr tablet, Take 1,000 mg by mouth 2 (two) times daily. , Disp: , Rfl:  .  metoprolol tartrate 75 MG TABS, Take 75 mg by mouth 2 (two) times daily. 100 mg twice daily, Disp: 60 tablet, Rfl: 0 .  Multiple Vitamin (MULTIVITAMIN WITH MINERALS) TABS tablet, Take 1 tablet by mouth daily., Disp: , Rfl:  .  omeprazole (PRILOSEC) 20 MG capsule, Take 20 mg by mouth daily. Once daily, Disp: , Rfl:  .  oxyCODONE-acetaminophen (PERCOCET) 10-325 MG tablet, Take 1 tablet by mouth 4 (four) times daily as needed for pain. , Disp: , Rfl:  .  polyethylene glycol (MIRALAX / GLYCOLAX) packet, Take 17 g by mouth daily as needed for mild constipation., Disp: 14 each, Rfl: 0 .  rivaroxaban (XARELTO) 20 MG TABS tablet, Take 20 mg by mouth daily., Disp: , Rfl:  .  senna-docusate (SENOKOT-S) 8.6-50 MG tablet, Take 2 tablets by mouth at bedtime as needed for mild constipation. (Patient not taking: Reported on 10/07/2017), Disp: , Rfl:  .  thiamine 100 MG tablet, Take 1 tablet (100 mg total) by mouth daily. (Patient not taking: Reported on 10/07/2017), Disp: 30 tablet, Rfl: 0   Allergies:  Cephalexin; Hctz  [hydrochlorothiazide]; Hydralazine; Penicillins; Reserpine; and Zaroxolyn [metolazone]  Review of Systems: Gen:  Denies  fever, sweats. HEENT: Denies blurred vision. Cvc:  No dizziness, chest pain or heaviness Resp:   Denies cough or sputum porduction. Gi: Denies swallowing difficulty, stomach pain.  constipation, bowel incontinence Gu:  Denies bladder incontinence, burning urine Ext:   No Joint pain, stiffness. Skin: No skin rash, easy bruising. Endoc:  No polyuria, polydipsia. Psych: No depression, insomnia. Other:  All other systems were reviewed and found to be negative other than what is mentioned in the HPI.   Physical Examination:   VS: BP 116/82 (BP Location: Left Arm, Cuff Size: Normal)   Pulse (!) 56   Ht 6\' 2"  (1.88 m)   Wt (!) 314 lb (142.4 kg)   SpO2 93%   BMI 40.32 kg/m    General Appearance: No distress  Neuro:without focal findings,  speech normal,  HEENT: PERRLA, EOM intact. Pulmonary: normal breath sounds, No wheezing.   CardiovascularNormal S1,S2.  No m/r/g.   Abdomen: Benign, Soft, non-tender. Renal:  No costovertebral tenderness  GU:  Not performed  at this time. Endoc: No evident thyromegaly, no signs of acromegaly. Skin:   warm, no rash. Extremities: normal, no cyanosis, clubbing.   LABORATORY PANEL:   CBC No results for input(s): WBC, HGB, HCT, PLT in the last 168 hours. ------------------------------------------------------------------------------------------------------------------  Chemistries  No results for input(s): NA, K, CL, CO2, GLUCOSE, BUN, CREATININE, CALCIUM, MG, AST, ALT, ALKPHOS, BILITOT in the last 168 hours.  Invalid input(s): GFRCGP ------------------------------------------------------------------------------------------------------------------  Cardiac Enzymes No results for input(s): TROPONINI in the last 168 hours. ------------------------------------------------------------  RADIOLOGY:   No results found for this or any previous visit. Results for orders placed during the hospital encounter of 11/20/17  DG Chest 2 View   Narrative CLINICAL DATA:  Increased shortness of breath.  EXAM: CHEST - 2 VIEW  COMPARISON:  October 07, 2017  FINDINGS: The tracheostomy tube is stable. Stable cardiomegaly. The hila  and mediastinum are normal. No pneumothorax. No nodules or masses. No focal infiltrates. No overt edema. Healed right rib fractures identified.  IMPRESSION: No active cardiopulmonary disease.   Electronically Signed   By: Gerome Sam III M.D   On: 11/20/2017 16:56    ------------------------------------------------------------------------------------------------------------------  Thank  you for allowing [facility] to assist in the care of your patient. Our recommendations are noted above.  Please contact us if we can be of further service.   Wells Guiles, MD.   Pulmonary and Critical Care Office Number: [phone]  Santiago Glad, M.D.  Billy Fischer, M.D  12/09/2017

## 2017-12-26 ENCOUNTER — Telehealth: Payer: Self-pay | Admitting: Internal Medicine

## 2017-12-26 DIAGNOSIS — J9610 Chronic respiratory failure, unspecified whether with hypoxia or hypercapnia: Secondary | ICD-10-CM

## 2017-12-26 NOTE — Telephone Encounter (Signed)
[name] ChandlerAngela Gardener with [employer] calling for patient asking for orders to please be sent in   Pt needs a heated humidifier for patient stating he is having hard issue getting up in the morning due to lack of moisture in room   Please advise

## 2017-12-26 NOTE — Telephone Encounter (Signed)
Order placed. Nothing further needed. 

## 2018-01-14 ENCOUNTER — Encounter (INDEPENDENT_AMBULATORY_CARE_PROVIDER_SITE_OTHER): Payer: Medicare HMO

## 2018-01-15 ENCOUNTER — Ambulatory Visit: Payer: Medicare HMO | Admitting: Nurse Practitioner

## 2018-01-19 ENCOUNTER — Other Ambulatory Visit: Payer: Self-pay | Admitting: Nurse Practitioner

## 2018-01-19 DIAGNOSIS — I739 Peripheral vascular disease, unspecified: Secondary | ICD-10-CM

## 2018-01-20 ENCOUNTER — Ambulatory Visit (INDEPENDENT_AMBULATORY_CARE_PROVIDER_SITE_OTHER): Payer: Medicare HMO

## 2018-01-20 DIAGNOSIS — I739 Peripheral vascular disease, unspecified: Secondary | ICD-10-CM | POA: Diagnosis not present

## 2018-01-27 ENCOUNTER — Other Ambulatory Visit: Payer: Self-pay

## 2018-01-27 ENCOUNTER — Encounter: Payer: Self-pay | Admitting: Emergency Medicine

## 2018-01-27 ENCOUNTER — Emergency Department
Admission: EM | Admit: 2018-01-27 | Discharge: 2018-01-27 | Disposition: A | Discharge status: Home / Self Care | Payer: Medicare HMO | Attending: Emergency Medicine | Admitting: Emergency Medicine

## 2018-01-27 DIAGNOSIS — I509 Heart failure, unspecified: Secondary | ICD-10-CM | POA: Insufficient documentation

## 2018-01-27 DIAGNOSIS — R7989 Other specified abnormal findings of blood chemistry: Secondary | ICD-10-CM | POA: Insufficient documentation

## 2018-01-27 DIAGNOSIS — N189 Chronic kidney disease, unspecified: Secondary | ICD-10-CM

## 2018-01-27 DIAGNOSIS — I110 Hypertensive heart disease with heart failure: Secondary | ICD-10-CM | POA: Diagnosis not present

## 2018-01-27 DIAGNOSIS — E119 Type 2 diabetes mellitus without complications: Secondary | ICD-10-CM | POA: Diagnosis not present

## 2018-01-27 DIAGNOSIS — Z794 Long term (current) use of insulin: Secondary | ICD-10-CM | POA: Insufficient documentation

## 2018-01-27 DIAGNOSIS — Z79899 Other long term (current) drug therapy: Secondary | ICD-10-CM | POA: Insufficient documentation

## 2018-01-27 LAB — CBC
HEMATOCRIT: 33.9 % — AB (ref 40.0–52.0)
Hemoglobin: 11.3 g/dL — ABNORMAL LOW (ref 13.0–18.0)
MCH: 28.8 pg (ref 26.0–34.0)
MCHC: 33.4 g/dL (ref 32.0–36.0)
MCV: 86.2 fL (ref 80.0–100.0)
Platelets: 198 10*3/uL (ref 150–440)
RBC: 3.93 MIL/uL — ABNORMAL LOW (ref 4.40–5.90)
RDW: 16.7 % — ABNORMAL HIGH (ref 11.5–14.5)
WBC: 5.2 10*3/uL (ref 3.8–10.6)

## 2018-01-27 LAB — BASIC METABOLIC PANEL
Anion gap: 8 (ref 5–15)
BUN: 27 mg/dL — ABNORMAL HIGH (ref 6–20)
CALCIUM: 8.9 mg/dL (ref 8.9–10.3)
CO2: 32 mmol/L (ref 22–32)
Chloride: 95 mmol/L — ABNORMAL LOW (ref 98–111)
Creatinine, Ser: 1.4 mg/dL — ABNORMAL HIGH (ref 0.61–1.24)
GFR calc Af Amer: >60 mL/min (ref >60)
GFR, EST NON AFRICAN AMERICAN: 55 mL/min — AB (ref >60)
GLUCOSE: 118 mg/dL — AB (ref 70–99)
Potassium: 4.2 mmol/L (ref 3.5–5.1)
Sodium: 135 mmol/L (ref 135–145)

## 2018-01-27 LAB — TROPONIN I

## 2018-01-27 NOTE — Discharge Instructions (Addendum)
Talk to your doctor about whether they wish to continue with the enalapril and the Lasix, return to the emergency room for any new or worrisome symptoms, see your doctor tomorrow.

## 2018-01-27 NOTE — ED Triage Notes (Signed)
Patient is scheduled for [facility] at [facility] on Thursday.  Patient was having blood work done and was called today stating that potassium and BUM/ CR were "off".

## 2018-01-27 NOTE — ED Provider Notes (Signed)
[facility] Emergency Department Provider Note  ____________________________________________   I have reviewed the triage vital signs and the nursing notes. Where available I have reviewed prior notes and, if possible and indicated, outside hospital notes.    HISTORY  Chief Complaint abnormal labs    HPI Geoff Dacanay is a 57 y.o. male  With a history of CHF, mild on his last echo, multivessel disease, pulmonary emboli in the past, OSA pancreatitis, renal insufficiency his kidney function is been getting gradually worse over the last 3 months, presents today after being called for routine blood work that showed a elevated potassium at 5.5.  Patient has no symptoms above his baseline shortness of breath he does not have any particular complaints at this time.     Past Medical History:  Diagnosis Date  . CHF (congestive heart failure) ([place])   . Diabetes mellitus ([place])   . Diabetic peripheral neuropathy ([place])   . Elevated liver enzymes    fatty liver per [facility]  . GERD (gastroesophageal reflux disease)   . History of pulmonary embolus (PE)   . Hyperlipidemia   . Hypertension   . Myocardial infarct ([place])   . OSA (obstructive sleep apnea)   . 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 Renal insufficiency     Patient Active Problem List   Diagnosis Date Noted  . RUQ pain   . Atrial fibrillation ([place]) 11/02/2016  . Chest pain 11/02/2016  . Hyponatremia 11/02/2016  . Chronic respiratory failure, unspecified whether with hypoxia or hypercapnia ([place]) 05/16/2014  . Ventilator dependence ([place]) 05/16/2014  . Obesity hypoventilation syndrome ([place]) 05/16/2014  . 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 CARDIAC SURGERY     With stent placement  . FLEXIBLE BRONCHOSCOPY N/A 07/28/2017   Procedure: FLEXIBLE BRONCHOSCOPY;  Surgeon: [name], MD;  Location: [facility] ORS;   Service: Pulmonary;  Laterality: N/A;  . LEFT HEART CATH AND CORONARY ANGIOGRAPHY N/A 09/01/2017   Procedure: LEFT HEART CATH AND CORONARY ANGIOGRAPHY and PCI stent;  Surgeon: Yolonda Kida, MD;  Location: [facility];  Service: Cardiovascular;  Laterality: N/A;  . TRACHEOSTOMY  2007  . VASECTOMY      Prior to Admission medications   Medication Sig Start Date End Date Taking? Authorizing Provider  acetaminophen (TYLENOL) 325 MG tablet Take 1 tablet (325 mg total) by mouth every 6 (six) hours as needed for mild pain (or Fever >/= 101). 09/02/17   Gouru, Illene Silver, MD  ALPRAZolam Duanne Moron) 0.5 MG tablet Take 1 tablet (0.5 mg total) by mouth every 8 (eight) hours as needed for anxiety. 09/02/17   Nicholes Mango, MD  AMBULATORY NON FORMULARY MEDICATION Medication Name: Trachea collar for use with nebulizer DX: J96.10 12/09/17   [name], MD  AMBULATORY NON FORMULARY MEDICATION Medication Name: Nebulizer DX: J96.10 12/09/17   [name], MD  amiodarone (PACERONE) 200 MG tablet Take 200 mg by mouth 2 (two) times daily. 09/24/17   [provider]  atorvastatin (LIPITOR) 40 MG tablet Take 1 tablet (40 mg total) by mouth daily at 6 PM. 11/04/16   Flora Lipps, MD  atorvastatin (LIPITOR) 80 MG tablet Take 80 mg by mouth at bedtime. 08/23/17   [provider]  busPIRone (BUSPAR) 10 MG tablet Take 10 mg by mouth 2 (two) times daily as needed.     [provider]  carbamazepine (TEGRETOL) 200 MG  tablet Take 2 tablets (400 mg total) by mouth daily at 10 pm. 11/04/16   Flora Lipps, MD  collagenase (SANTYL) ointment Apply topically daily. 07/20/17   Dustin Flock, MD  enalapril (VASOTEC) 2.5 MG tablet Take 1 tablet (2.5 mg total) by mouth daily. 10/09/17   Hillary Bow, MD  feeding supplement, GLUCERNA SHAKE, (GLUCERNA SHAKE) LIQD Take 237 mLs by mouth 2 (two) times daily between meals. 09/02/17   Nicholes Mango, MD  folic acid (FOLVITE) 1 MG tablet Take 1 tablet  (1 mg total) by mouth daily. 09/03/17   Nicholes Mango, MD  furosemide (LASIX) 40 MG tablet Take 1 tablet (40 mg total) by mouth daily. 10/09/17 11/08/17  Hillary Bow, MD  gabapentin (NEURONTIN) 800 MG tablet Take 1 tablet (800 mg total) by mouth 3 (three) times daily. 1 tablet twice daily Patient taking differently: Take 1,600 mg by mouth 2 (two) times daily.  09/02/17   Gouru, Illene Silver, MD  insulin detemir (LEVEMIR) 100 unit/ml SOLN Inject 20 Units into the skin 2 (two) times daily.     [provider]  insulin lispro (HUMALOG) 100 UNIT/ML injection Inject 2-10 Units into the skin 3 (three) times daily with meals.     [provider]  ipratropium-albuterol (DUONEB) 0.5-2.5 (3) MG/3ML SOLN Take 3 mLs by nebulization 3 (three) times daily. 12/09/17   [name], MD  lamoTRIgine (LAMICTAL) 150 MG tablet Take 150 mg by mouth 2 (two) times daily.  07/02/17   [provider]  metFORMIN (GLUCOPHAGE-XR) 500 MG 24 hr tablet Take 1,000 mg by mouth 2 (two) times daily.  01/24/14   [provider]  metoprolol tartrate 75 MG TABS Take 75 mg by mouth 2 (two) times daily. 100 mg twice daily 10/09/17   Hillary Bow, MD  Multiple Vitamin (MULTIVITAMIN WITH MINERALS) TABS tablet Take 1 tablet by mouth daily. 09/03/17   Gouru, Illene Silver, MD  omeprazole (PRILOSEC) 20 MG capsule Take 20 mg by mouth daily. Once daily    [provider]  oxyCODONE-acetaminophen (PERCOCET) 10-325 MG tablet Take 1 tablet by mouth 4 (four) times daily as needed for pain.     [provider]  polyethylene glycol (MIRALAX / GLYCOLAX) packet Take 17 g by mouth daily as needed for mild constipation. 09/02/17   Gouru, Illene Silver, MD  predniSONE (STERAPRED UNI-PAK 21 TAB) 10 MG (21) TBPK tablet Take as directed. 12/09/17   [name], MD  rivaroxaban (XARELTO) 20 MG TABS tablet Take 20 mg by mouth daily.    [provider]  senna-docusate (SENOKOT-S) 8.6-50 MG tablet Take 2 tablets by  mouth at bedtime as needed for mild constipation. 09/02/17   Nicholes Mango, MD  thiamine 100 MG tablet Take 1 tablet (100 mg total) by mouth daily. 09/03/17   Nicholes Mango, MD    Allergies Cephalexin; Hctz  [hydrochlorothiazide]; Hydralazine; Penicillins; Reserpine; and Zaroxolyn [metolazone]  Family History  Problem Relation Age of Onset  . Heart attack Mother   . Coronary artery disease Father   . Heart attack Father   . Kidney cancer Neg Hx   . Kidney disease Neg Hx   . Prostate cancer Neg Hx     Social History Social History   Tobacco Use  . Smoking status: Never Smoker  . Smokeless tobacco: Never Used  Substance Use Topics  . Alcohol use: Yes    Alcohol/week: 3.6 - 4.2 oz    Types: 6 - 7 Cans of beer per week  . Drug use: No  Review of Systems Constitutional: No fever/chills Eyes: No visual changes. ENT: No sore throat. No stiff neck no neck pain Cardiovascular: Denies chest pain. Respiratory: Denies shortness of breath over his baseline Gastrointestinal:   no vomiting.  No diarrhea.  No constipation. Genitourinary: Negative for dysuria. Musculoskeletal: Positive chronic lower extremity swelling Skin: Negative for rash. Neurological: Negative for severe headaches, focal weakness or numbness.   ____________________________________________   PHYSICAL EXAM:  VITAL SIGNS: ED Triage Vitals  Enc Vitals Group     BP 01/27/18 1835 118/64     Pulse Rate 01/27/18 1835 63     Resp 01/27/18 1835 16     Temp 01/27/18 1835 98.5 F (36.9 C)     Temp Source 01/27/18 1835 Oral     SpO2 01/27/18 1835 98 %     Weight 01/27/18 1832 (!) 320 lb (145.2 kg)     Height 01/27/18 1832 _0  (1.88 m)     Head Circumference --      Peak Flow --      Pain Score 01/27/18 1832 0     Pain Loc --      Pain Edu? --      Excl. in [place]? --     Constitutional: Alert and oriented. Well appearing and in no acute distress. Eyes: Conjunctivae are normal Head: Atraumatic HEENT: No  congestion/rhinnorhea. Mucous membranes are moist.  Oropharynx non-erythematous Neck:   Nontender with no meningismus, no masses, no stridor, trach in place, Cardiovascular: Normal rate, regular rhythm. Grossly normal heart sounds.  Good peripheral circulation. Respiratory: Normal respiratory effort.  No retractions. Lungs CTAB. Abdominal: Soft and nontender. No distention. No guarding no rebound Back:  There is no focal tenderness or step off.  there is no midline tenderness there are no lesions noted. there is no CVA tenderness Musculoskeletal: No lower extremity tenderness, no upper extremity tenderness. No joint effusions, no DVT signs strong distal pulses lateral edema, legs are wrapped patient does not want me to unwrap them he states  Neurologic:  Normal speech and language. No gross focal neurologic deficits are appreciated.  Skin:  Skin is warm, dry and intact. No rash noted. Psychiatric: Mood and affect are normal. Speech and behavior are normal.  ____________________________________________   LABS (all labs ordered are listed, but only abnormal results are displayed)  Labs Reviewed  BASIC METABOLIC PANEL - Abnormal; Notable for the following components:      Result Value   Chloride 95 (*)    Glucose, Bld 118 (*)    BUN 27 (*)    Creatinine, Ser 1.40 (*)    GFR calc non Af Amer 55 (*)    All other components within normal limits  CBC - Abnormal; Notable for the following components:   RBC 3.93 (*)    Hemoglobin 11.3 (*)    HCT 33.9 (*)    RDW 16.7 (*)    All other components within normal limits  TROPONIN I    Pertinent labs  results that were available during my care of the patient were reviewed by me and considered in my medical decision making (see chart for details). ____________________________________________  EKG  I personally interpreted any EKGs ordered by me or triage Partial right bundle branch block sinus rhythm rate  61 ____________________________________________  RADIOLOGY  Pertinent labs & imaging results that were available during my care of the patient were reviewed by me and considered in my medical decision making (see chart for details). If possible, patient and/or  family made aware of any abnormal findings.  No results found. ____________________________________________    PROCEDURES  Procedure(s) performed: None  Procedures  Critical Care performed: None  ____________________________________________   INITIAL IMPRESSION / ASSESSMENT AND PLAN / ED COURSE  Pertinent labs & imaging results that were available during my care of the patient were reviewed by me and considered in my medical decision making (see chart for details).  Patient here because he was sent in for potassium 5.5, here however his potassium is reassuring, there is no real evidence of lab work that I can see him what we have drawn here, it was not drawn from an IV was drawn from a straight stick there is no evidence of hemolysis on our and and if they were it would elevate his potassium not lower it.  Is unclear exactly what the discrepancy is from.  Her creatinine is slightly better than his, weight 1.4 his was 1.5.  Looking back at his [facility] values, he has been having gradually worsening kidney function for several months, and he has seen a nephrologist for this.  I do not see any acute pathology today, we discussed IV fluid but given history of CHF I do not think it would be in his best interest for an asymptomatic and gradually worsening kidney function for which she is followed by nephrologist.  I did suggest and they talk to his doctor about his ACE inhibitor and his Lasix.  Patient has no further complaints or concerns and we will discharge him.  Return precautions follow-up given and understood.    ____________________________________________   FINAL CLINICAL IMPRESSION(S) / ED DIAGNOSES  Final diagnoses:  None       This chart was dictated using voice recognition software.  Despite best efforts to proofread,  errors can occur which can change meaning.      Schuyler Amor, MD 01/27/18 [phone]

## 2018-01-27 NOTE — ED Notes (Signed)
PT in NAD at time of d/c , verbalizes d/c understanding and follow up. VSS. Pt unable to sign due to singature pad malfnx

## 2018-01-29 ENCOUNTER — Ambulatory Visit: Payer: Medicare HMO | Admitting: Nurse Practitioner

## 2018-02-26 ENCOUNTER — Telehealth: Payer: Self-pay | Admitting: Internal Medicine

## 2018-02-26 NOTE — Telephone Encounter (Signed)
noted 

## 2018-02-26 NOTE — Telephone Encounter (Signed)
Patient c/o sob and wants to be seen asap .  Scheduled 9/6 with Ram

## 2018-02-26 NOTE — Progress Notes (Addendum)
[facility] Pulmonary Medicine     Assessment and Plan:   Chronic hypoxic and hypercapnic respiratory failure with obesity hypoventilation syndrome s/p tracheostomy. --Continue current measures, continue nocturnal ventilation.   Dyspnea on exertion, debility/deconditioning. Chronic bronchitis/COPD.  - Patient has had minimal to no relief after removing the endobronchial polyp. - Discussed that his chronic and progressive dyspnea on exertion is likely chronic, he is hoping for a combination of inhalers that can make him feel back to normal again.  I explained that this is unlikely, will start him on nebulized Pulmicort to see if this addition will help. - Discussed that some of his deconditioning is secondary to obesity, lack of physical activity.  Will refer to pulmonary rehab to try to improve his deconditioning.  This may be challenging given his comorbidities and peripheral neuropathy which makes walking difficult. 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  This could cause dynamic airway collapse with dyspnea on exertion.  Upon discussion of his multiple issues, he is very distant encouraged, he is hoping to live until the age of 39 or more.  I discussed that he may benefit from a second opinion from a pulmonologist at [facility], to see if there is any other interventions or treatments as he could be offered.  Left endobronchial lesion.  --Status post bronchoscopy 07/28/2017, left endobronchial lesion, biopsy negative for malignancy. - Referred to interventional pulmonology at [facility] for possible resection.     Date: 02/26/2018  MRN# [id number] [name] 1961/03/29   [name] is a 57 y.o. old male seen in follow up for chief complaint of  Chief Complaint  Patient presents with  . Follow-up    SOB; brown mucus:     HPI:  The patient is a 57 year old male  with chronic hypoxic respiratory failure, with tracheal stenosis. He has a permanent tracheostomy and is on a home ventilator at nighttime.  He had a previous CT chest in May 2018 which was suggestive of an endobronchial lesion.  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  He was subsequently referred to interventional pulmonology at [facility] for further management and possible resection. Upon eval there he was found to have significant bradycardia and ended up being admitted and getting a pacemaker. He went pre-op eval a number of times but procedure was delayed due to comorbidities. He was finally able to undergo the procedure on 01/29/2018, which showed a left mainstem endobronchial Leo myoma.  Desat walk 02/27/18; baseline sat at rest on RA was 93% and HR 56, pt walked 360 feet at slow pace with significant anxiety , however did not appear in distress. Sat dropped to 87%, then increased to 90% at rest.  Despite the resection he continues to have dyspnea on exertion, he is using albuterol nebs 2-3 times per day, he continues to have dyspnea, he continues to cough up brown phlegm.   A. Bronchus, left mainstem, endobronchial biopsy:   Endobronchial leiomyoma. See comment.  Comment: The biopsy is composed of a bland spindle cell proliferation with smooth muscle differentiation involving the bronchiolar wall accompanied by squamous metaplasia of the overlying airway epithelium. The morphologic and immunophenotypic profile (see below) support the above diagnosis.    Medication:    Current Outpatient Medications:  .  acetaminophen (TYLENOL) 325 MG tablet, Take 1 tablet (325 mg total) by  mouth every 6 (six) hours as needed for mild pain (or Fever >/= 101)., Disp: , Rfl:  .  ALPRAZolam (XANAX) 0.5 MG tablet, Take 1 tablet (0.5 mg total) by mouth every 8 (eight) hours as needed for anxiety., Disp: 20 tablet, Rfl: 0 .   AMBULATORY NON FORMULARY MEDICATION, Medication Name: Trachea collar for use with nebulizer DX: J96.10, Disp: 1 each, Rfl: 0 .  AMBULATORY NON FORMULARY MEDICATION, Medication Name: Nebulizer DX: J96.10, Disp: 1 each, Rfl: 0 .  amiodarone (PACERONE) 200 MG tablet, Take 200 mg by mouth 2 (two) times daily., Disp: , Rfl: 8 .  atorvastatin (LIPITOR) 40 MG tablet, Take 1 tablet (40 mg total) by mouth daily at 6 PM., Disp: 30 tablet, Rfl: 1 .  atorvastatin (LIPITOR) 80 MG tablet, Take 80 mg by mouth at bedtime., Disp: , Rfl:  .  busPIRone (BUSPAR) 10 MG tablet, Take 10 mg by mouth 2 (two) times daily as needed. , Disp: , Rfl:  .  carbamazepine (TEGRETOL) 200 MG tablet, Take 2 tablets (400 mg total) by mouth daily at 10 pm., Disp: 30 tablet, Rfl: 0 .  collagenase (SANTYL) ointment, Apply topically daily., Disp: 15 g, Rfl: 0 .  enalapril (VASOTEC) 2.5 MG tablet, Take 1 tablet (2.5 mg total) by mouth daily., Disp: 30 tablet, Rfl: 0 .  feeding supplement, GLUCERNA SHAKE, (GLUCERNA SHAKE) LIQD, Take 237 mLs by mouth 2 (two) times daily between meals., Disp: 60 Can, Rfl: 0 .  folic acid (FOLVITE) 1 MG tablet, Take 1 tablet (1 mg total) by mouth daily., Disp: 30 tablet, Rfl: 0 .  furosemide (LASIX) 40 MG tablet, Take 1 tablet (40 mg total) by mouth daily., Disp: 30 tablet, Rfl: 0 .  gabapentin (NEURONTIN) 800 MG tablet, Take 1 tablet (800 mg total) by mouth 3 (three) times daily. 1 tablet twice daily (Patient taking differently: Take 1,600 mg by mouth 2 (two) times daily. ), Disp: 60 tablet, Rfl: 0 .  insulin detemir (LEVEMIR) 100 unit/ml SOLN, Inject 20 Units into the skin 2 (two) times daily. , Disp: , Rfl:  .  insulin lispro (HUMALOG) 100 UNIT/ML injection, Inject 2-10 Units into the skin 3 (three) times daily with meals. , Disp: , Rfl:  .  ipratropium-albuterol (DUONEB) 0.5-2.5 (3) MG/3ML SOLN, Take 3 mLs by nebulization 3 (three) times daily., Disp: 360 mL, Rfl: 1 .  lamoTRIgine (LAMICTAL) 150 MG tablet,  Take 150 mg by mouth 2 (two) times daily. , Disp: , Rfl:  .  metFORMIN (GLUCOPHAGE-XR) 500 MG 24 hr tablet, Take 1,000 mg by mouth 2 (two) times daily. , Disp: , Rfl:  .  metoprolol tartrate 75 MG TABS, Take 75 mg by mouth 2 (two) times daily. 100 mg twice daily, Disp: 60 tablet, Rfl: 0 .  Multiple Vitamin (MULTIVITAMIN WITH MINERALS) TABS tablet, Take 1 tablet by mouth daily., Disp: , Rfl:  .  omeprazole (PRILOSEC) 20 MG capsule, Take 20 mg by mouth daily. Once daily, Disp: , Rfl:  .  oxyCODONE-acetaminophen (PERCOCET) 10-325 MG tablet, Take 1 tablet by mouth 4 (four) times daily as needed for pain. , Disp: , Rfl:  .  polyethylene glycol (MIRALAX / GLYCOLAX) packet, Take 17 g by mouth daily as needed for mild constipation., Disp: 14 each, Rfl: 0 .  predniSONE (STERAPRED UNI-PAK 21 TAB) 10 MG (21) TBPK tablet, Take as directed., Disp: 21 tablet, Rfl: 0 .  rivaroxaban (XARELTO) 20 MG TABS tablet, Take 20 mg by mouth daily., Disp: , Rfl:  .  senna-docusate (SENOKOT-S) 8.6-50 MG tablet, Take 2 tablets by mouth at bedtime as needed for mild constipation., Disp: , Rfl:  .  thiamine 100 MG tablet, Take 1 tablet (100 mg total) by mouth daily., Disp: 30 tablet, Rfl: 0   Allergies:  Cephalexin; Hctz  [hydrochlorothiazide]; Hydralazine; Penicillins; Reserpine; and Zaroxolyn [metolazone]  Review of Systems:  Constitutional: Feels well. Cardiovascular: No chest pain.  Pulmonary: Denies hemoptysis  The remainder of systems were reviewed and were found to be negative other than what is documented in the HPI.   Physical Examination:   VS: BP 132/82 (BP Location: Left Arm, Cuff Size: Normal)   Pulse (!) 54   Ht 6\' 2"  (1.88 m)   Wt (!) 318 lb (144.2 kg)   SpO2 98%   BMI 40.83 kg/m   General Appearance: No distress  Neuro:without focal findings, mental status, speech normal, alert and oriented HEENT: PERRLA, EOM intact, trach in place.  Pulmonary: No wheezing, No rales  CardiovascularNormal S1,S2.   No m/r/g.  Abdomen: Benign, Soft, non-tender, No masses Renal:  No costovertebral tenderness  GU:  No performed at this time. Endoc: No evident thyromegaly, no signs of acromegaly or Cushing features Skin:   warm, no rashes, no ecchymosis  Extremities: normal, no cyanosis, clubbing.      LABORATORY PANEL:   CBC No results for input(s): WBC, HGB, HCT, PLT in the last 168 hours. ------------------------------------------------------------------------------------------------------------------  Chemistries  No results for input(s): NA, K, CL, CO2, GLUCOSE, BUN, CREATININE, CALCIUM, MG, AST, ALT, ALKPHOS, BILITOT in the last 168 hours.  Invalid input(s): GFRCGP ------------------------------------------------------------------------------------------------------------------  Cardiac Enzymes No results for input(s): TROPONINI in the last 168 hours. ------------------------------------------------------------  RADIOLOGY:   No results found for this or any previous visit. Results for orders placed during the hospital encounter of 11/20/17  DG Chest 2 View   Narrative CLINICAL DATA:  Increased shortness of breath.  EXAM: CHEST - 2 VIEW  COMPARISON:  October 07, 2017  FINDINGS: The tracheostomy tube is stable. Stable cardiomegaly. The hila and mediastinum are normal. No pneumothorax. No nodules or masses. No focal infiltrates. No overt edema. Healed right rib fractures identified.  IMPRESSION: No active cardiopulmonary disease.   Electronically Signed   By: Gerome Sam III M.D   On: 11/20/2017 16:56    ------------------------------------------------------------------------------------------------------------------  Thank  you for allowing [facility] to assist in the care of your patient. Our recommendations are noted above.  Please contact us if we can be of further service.  Wells Guiles, M.D., F.C.C.P.  Board Certified in Internal  Medicine, Pulmonary Medicine, Critical Care Medicine, and Sleep Medicine.  [facility] Office Number: [phone]   02/26/2018

## 2018-02-27 ENCOUNTER — Ambulatory Visit (INDEPENDENT_AMBULATORY_CARE_PROVIDER_SITE_OTHER): Payer: Medicare HMO | Admitting: Internal Medicine

## 2018-02-27 ENCOUNTER — Encounter: Payer: Self-pay | Admitting: Internal Medicine

## 2018-02-27 VITALS — BP 132/82 | HR 54 | Ht 74.0 in | Wt 318.0 lb

## 2018-02-27 DIAGNOSIS — J9610 Chronic respiratory failure, unspecified whether with hypoxia or hypercapnia: Secondary | ICD-10-CM | POA: Diagnosis not present

## 2018-02-27 MED ORDER — BUDESONIDE 0.5 MG/2ML IN SUSP: 0.5000 mg | Freq: Two times a day (BID) | RESPIRATORY_TRACT | 12 refills | Status: AC

## 2018-02-27 NOTE — Patient Instructions (Addendum)
Will start on oxygen at 1L with activity (continue using oxygen with your ventilator).  Will refer to pulmonary rehab.

## 2018-02-27 NOTE — Addendum Note (Signed)
Addended by: Janean Sark on: 02/27/2018 04:13 PM   Modules accepted: Orders

## 2018-02-27 NOTE — Addendum Note (Signed)
Addended by: Janean Sark on: 02/27/2018 12:36 PM   Modules accepted: Orders

## 2018-03-03 ENCOUNTER — Telehealth: Payer: Self-pay | Admitting: Internal Medicine

## 2018-03-03 NOTE — Telephone Encounter (Signed)
Patient calling Patient has an ECHO at [facility] and will need to cancel and reschedule his 6 minute walk that was scheduled for 9/13 at 2pm Please call

## 2018-03-03 NOTE — Telephone Encounter (Signed)
SMW cancelled LM for patient to call back to reschedule. Nothing further needed.

## 2018-03-06 ENCOUNTER — Ambulatory Visit: Payer: Medicare HMO

## 2018-03-17 ENCOUNTER — Encounter: Payer: Medicare HMO | Attending: Internal Medicine

## 2018-03-17 ENCOUNTER — Other Ambulatory Visit: Payer: Self-pay

## 2018-03-17 VITALS — Ht 73.5 in | Wt 313.2 lb

## 2018-03-17 DIAGNOSIS — J9610 Chronic respiratory failure, unspecified whether with hypoxia or hypercapnia: Secondary | ICD-10-CM | POA: Diagnosis present

## 2018-03-17 DIAGNOSIS — Z86711 Personal history of pulmonary embolism: Secondary | ICD-10-CM | POA: Insufficient documentation

## 2018-03-17 DIAGNOSIS — E1142 Type 2 diabetes mellitus with diabetic polyneuropathy: Secondary | ICD-10-CM | POA: Insufficient documentation

## 2018-03-17 DIAGNOSIS — Z7901 Long term (current) use of anticoagulants: Secondary | ICD-10-CM | POA: Diagnosis not present

## 2018-03-17 DIAGNOSIS — Z79899 Other long term (current) drug therapy: Secondary | ICD-10-CM | POA: Diagnosis not present

## 2018-03-17 DIAGNOSIS — I110 Hypertensive heart disease with heart failure: Secondary | ICD-10-CM | POA: Diagnosis not present

## 2018-03-17 DIAGNOSIS — E785 Hyperlipidemia, unspecified: Secondary | ICD-10-CM | POA: Diagnosis not present

## 2018-03-17 DIAGNOSIS — I252 Old myocardial infarction: Secondary | ICD-10-CM | POA: Insufficient documentation

## 2018-03-17 DIAGNOSIS — N289 Disorder of kidney and ureter, unspecified: Secondary | ICD-10-CM | POA: Diagnosis not present

## 2018-03-17 DIAGNOSIS — K219 Gastro-esophageal reflux disease without esophagitis: Secondary | ICD-10-CM | POA: Insufficient documentation

## 2018-03-17 DIAGNOSIS — Z794 Long term (current) use of insulin: Secondary | ICD-10-CM | POA: Insufficient documentation

## 2018-03-17 DIAGNOSIS — Z7951 Long term (current) use of inhaled steroids: Secondary | ICD-10-CM | POA: Diagnosis not present

## 2018-03-17 DIAGNOSIS — I509 Heart failure, unspecified: Secondary | ICD-10-CM | POA: Diagnosis not present

## 2018-03-17 DIAGNOSIS — G4733 Obstructive sleep apnea (adult) (pediatric): Secondary | ICD-10-CM | POA: Diagnosis not present

## 2018-03-17 NOTE — Patient Instructions (Signed)
Patient Instructions  Patient Details  Name: [name] MRN: [id number] Date of Birth: 1960/08/30 Referring Provider:  Shane Crutchamachandran, Pradeep, *  Below are your personal goals for exercise, nutrition, and risk factors. Our goal is to help you stay on track towards obtaining and maintaining these goals. We will be discussing your progress on these goals with you throughout the program.  Initial Exercise Prescription: Initial Exercise Prescription - 03/17/18 1500      Date of Initial Exercise RX and Referring Provider   Date  03/17/18    Referring Provider  Shane Crutchamachandran, Pradeep MD      Oxygen   Oxygen  Continuous    Liters  2      Treadmill   MPH  0.8    Grade  0    Minutes  15    METs  1.6      Recumbant Elliptical   Level  1    RPM  50    Minutes  15    METs  1.6      T5 Nustep   Level  1    SPM  80    Minutes  15    METs  1.6      Prescription Details   Frequency (times per week)  3    Duration  Progress to 45 minutes of aerobic exercise without signs/symptoms of physical distress      Intensity   THRR 40-80% of Max Heartrate  102-144    Ratings of Perceived Exertion  11-13    Perceived Dyspnea  0-4      Progression   Progression  Continue to progress workloads to maintain intensity without signs/symptoms of physical distress.      Resistance Training   Training Prescription  Yes    Weight  4 lbs    Reps  10-15       Exercise Goals: Frequency: Be able to perform aerobic exercise two to three times per week in program working toward 2-5 days per week of home exercise.  Intensity: Work with a perceived exertion of 11 (fairly light) - 15 (hard) while following your exercise prescription.  We will make changes to your prescription with you as you progress through the program.   Duration: Be able to do 30 to 45 minutes of continuous aerobic exercise in addition to a 5 minute warm-up and a 5 minute cool-down routine.   Nutrition Goals: Your personal  nutrition goals will be established when you do your nutrition analysis with the dietician.  The following are general nutrition guidelines to follow: Cholesterol < 200mg /day Sodium < 1500mg /day Fiber: Men over 50 yrs - 30 grams per day  Personal Goals: Personal Goals and Risk Factors at Admission - 03/17/18 1525      Core Components/Risk Factors/Patient Goals on Admission    Weight Management  Weight Loss    Intervention  Weight Management: Develop a combined nutrition and exercise program designed to reach desired caloric intake, while maintaining appropriate intake of nutrient and fiber, sodium and fats, and appropriate energy expenditure required for the weight goal.;Weight Management/Obesity: Establish reasonable short term and long term weight goals.;Weight Management: Provide education and appropriate resources to help participant work on and attain dietary goals.    Admit Weight  313 lb 3.2 oz (142.1 kg)    Goal Weight: Short Term  308 lb (139.7 kg)    Goal Weight: Long Term  290 lb (131.5 kg)    Expected Outcomes  Short Term: Continue  to assess and modify interventions until short term weight is achieved;Long Term: Adherence to nutrition and physical activity/exercise program aimed toward attainment of established weight goal;Understanding recommendations for meals to include 15-35% energy as protein, 25-35% energy from fat, 35-60% energy from carbohydrates, less than 200mg  of dietary cholesterol, 20-35 gm of total fiber daily;Understanding of distribution of calorie intake throughout the day with the consumption of 4-5 meals/snacks;Weight Loss: Understanding of general recommendations for a balanced deficit meal plan, which promotes 1-2 lb weight loss per week and includes a negative energy balance of [phone] kcal/d    Improve shortness of breath with ADL's  Yes    Intervention  Provide education, individualized exercise plan and daily activity instruction to help decrease symptoms of  SOB with activities of daily living.    Expected Outcomes  Short Term: Improve cardiorespiratory fitness to achieve a reduction of symptoms when performing ADLs;Long Term: Be able to perform more ADLs without symptoms or delay the onset of symptoms    Diabetes  Yes    Intervention  Provide education about signs/symptoms and action to take for hypo/hyperglycemia.;Provide education about proper nutrition, including hydration, and aerobic/resistive exercise prescription along with prescribed medications to achieve blood glucose in normal ranges: Fasting glucose 65-99 mg/dL    Expected Outcomes  Short Term: Participant verbalizes understanding of the signs/symptoms and immediate care of hyper/hypoglycemia, proper foot care and importance of medication, aerobic/resistive exercise and nutrition plan for blood glucose control.;Long Term: Attainment of HbA1C < 7%.   his A1c was 6.5 he states   Heart Failure  Yes   pace maker   Intervention  Provide a combined exercise and nutrition program that is supplemented with education, support and counseling about heart failure. Directed toward relieving symptoms such as shortness of breath, decreased exercise tolerance, and extremity edema.    Expected Outcomes  Improve functional capacity of life;Short term: Attendance in program 2-3 days a week with increased exercise capacity. Reported lower sodium intake. Reported increased fruit and vegetable intake. Reports medication compliance.;Short term: Daily weights obtained and reported for increase. Utilizing diuretic protocols set by physician.;Long term: Adoption of self-care skills and reduction of barriers for early signs and symptoms recognition and intervention leading to self-care maintenance.    Hypertension  Yes    Intervention  Provide education on lifestyle modifcations including regular physical activity/exercise, weight management, moderate sodium restriction and increased consumption of fresh fruit, vegetables,  and low fat dairy, alcohol moderation, and smoking cessation.;Monitor prescription use compliance.    Expected Outcomes  Short Term: Continued assessment and intervention until BP is < 140/31mm HG in hypertensive participants. < 130/18mm HG in hypertensive participants with diabetes, heart failure or chronic kidney disease.;Long Term: Maintenance of blood pressure at goal levels.    Lipids  Yes    Intervention  Provide education and support for participant on nutrition & aerobic/resistive exercise along with prescribed medications to achieve LDL 70mg , HDL >40mg .    Expected Outcomes  Short Term: Participant states understanding of desired cholesterol values and is compliant with medications prescribed. Participant is following exercise prescription and nutrition guidelines.;Long Term: Cholesterol controlled with medications as prescribed, with individualized exercise RX and with personalized nutrition plan. Value goals: LDL < 70mg , HDL > 40 mg.       Tobacco Use Initial Evaluation: Social History   Tobacco Use  Smoking Status Never Smoker  Smokeless Tobacco Never Used    Exercise Goals and Review: Exercise Goals    Row Name 03/17/18 1526  Exercise Goals   Increase Physical Activity  Yes       Intervention  Provide advice, education, support and counseling about physical activity/exercise needs.;Develop an individualized exercise prescription for aerobic and resistive training based on initial evaluation findings, risk stratification, comorbidities and participant's personal goals.       Expected Outcomes  Short Term: Attend rehab on a regular basis to increase amount of physical activity.;Long Term: Add in home exercise to make exercise part of routine and to increase amount of physical activity.;Long Term: Exercising regularly at least 3-5 days a week.       Increase Strength and Stamina  Yes       Intervention  Provide advice, education, support and counseling about  physical activity/exercise needs.;Develop an individualized exercise prescription for aerobic and resistive training based on initial evaluation findings, risk stratification, comorbidities and participant's personal goals.       Expected Outcomes  Short Term: Increase workloads from initial exercise prescription for resistance, speed, and METs.;Short Term: Perform resistance training exercises routinely during rehab and add in resistance training at home;Long Term: Improve cardiorespiratory fitness, muscular endurance and strength as measured by increased METs and functional capacity ( )       Able to understand and use rate of perceived exertion (RPE) scale  Yes       Intervention  Provide education and explanation on how to use RPE scale       Expected Outcomes  Short Term: Able to use RPE daily in rehab to express subjective intensity level;Long Term:  Able to use RPE to guide intensity level when exercising independently       Able to understand and use Dyspnea scale  Yes       Intervention  Provide education and explanation on how to use Dyspnea scale       Expected Outcomes  Short Term: Able to use Dyspnea scale daily in rehab to express subjective sense of shortness of breath during exertion;Long Term: Able to use Dyspnea scale to guide intensity level when exercising independently       Knowledge and understanding of Target Heart Rate Range (THRR)  Yes       Intervention  Provide education and explanation of THRR including how the numbers were predicted and where they are located for reference       Expected Outcomes  Short Term: Able to state/look up THRR;Short Term: Able to use daily as guideline for intensity in rehab;Long Term: Able to use THRR to govern intensity when exercising independently       Able to check pulse independently  Yes       Intervention  Provide education and demonstration on how to check pulse in carotid and radial arteries.;Review the importance of being able to  check your own pulse for safety during independent exercise       Expected Outcomes  Short Term: Able to explain why pulse checking is important during independent exercise;Long Term: Able to check pulse independently and accurately       Understanding of Exercise Prescription  Yes       Intervention  Provide education, explanation, and written materials on patient's individual exercise prescription       Expected Outcomes  Short Term: Able to explain program exercise prescription;Long Term: Able to explain home exercise prescription to exercise independently          Copy of goals given to participant.

## 2018-03-17 NOTE — Progress Notes (Signed)
Daily Session Note  Patient Details  Name: [name] MRN: [id number] Date of Birth: 1961/05/22 Referring Provider:     Pulmonary Rehab from 03/17/2018 in [facility]  Referring Provider  [name] MD      Encounter Date: 03/17/2018  Check In: Session Check In - 03/17/18 1445      Check-In   Supervising physician immediately available to respond to emergencies  [facility] immediately available ER MD    Physician(s)  Dr. Corky Downs and Jimmye Norman    Location  [facility]-Cardiac & Pulmonary Rehab    Staff Present  Alberteen Sam, MA, RCEP, CCRP, Exercise Physiologist;Joseph Tessie Fass RCP,RRT,BSRT    Medication changes reported      No    Fall or balance concerns reported     No    Warm-up and Cool-down  Not performed (comment)   Medical Evaluation   Resistance Training Performed  No    VAD Patient?  No      Pain Assessment   Currently in Pain?  No/denies        Exercise Prescription Changes - 03/17/18 1500      Response to Exercise   Blood Pressure (Admit)  124/72    Blood Pressure (Exercise)  146/64    Blood Pressure (Exit)  136/72    Heart Rate (Admit)  61 bpm    Heart Rate (Exercise)  100 bpm    Heart Rate (Exit)  60 bpm    Oxygen Saturation (Admit)  92 %    Oxygen Saturation (Exercise)  80 %    Oxygen Saturation (Exit)  85 %    Rating of Perceived Exertion (Exercise)  12    Perceived Dyspnea (Exercise)  1    Symptoms  SOB, arms and legs tired and tight, slightly dizzy    Comments  walk test results       Social History   Tobacco Use  Smoking Status Never Smoker  Smokeless Tobacco Never Used    Goals Met:  Exercise tolerated well Personal goals reviewed Queuing for purse lip breathing No report of cardiac concerns or symptoms Strength training completed today  Goals Unmet:  Not Applicable  Comments:  6 Minute Walk    Row Name 03/17/18 1520         6 Minute Walk   Phase  Initial     Distance  525 feet used rollator     Walk Time  6 minutes     # of Rest Breaks  0     MPH  0.99     METS  1.65     RPE  12     Perceived Dyspnea   1     VO2 Peak  5.78     Symptoms  Yes (comment)     Comments  arms got tired, legs got tight, slightly dizzy headed     Resting HR  61 bpm     Resting BP  124/72     Resting Oxygen Saturation   92 %     Exercise Oxygen Saturation  during 6 min walk  80 %     Max Ex. HR  100 bpm     Max Ex. BP  146/64     2 Minute Post BP  136/72       Interval HR   1 Minute HR  75     2 Minute HR  80     3 Minute HR  91  4 Minute HR  98     5 Minute HR  100     6 Minute HR  90     2 Minute Post HR  60     Interval Heart Rate?  Yes       Interval Oxygen   Interval Oxygen?  Yes     Baseline Oxygen Saturation %  92 %     1 Minute Oxygen Saturation %  83 %     1 Minute Liters of Oxygen  0 L Room Air     2 Minute Oxygen Saturation %  82 %     2 Minute Liters of Oxygen  0 L     3 Minute Oxygen Saturation %  82 %     3 Minute Liters of Oxygen  0 L     4 Minute Oxygen Saturation %  82 %     4 Minute Liters of Oxygen  0 L     5 Minute Oxygen Saturation %  80 %     5 Minute Liters of Oxygen  0 L     6 Minute Oxygen Saturation %  82 %     6 Minute Liters of Oxygen  0 L     2 Minute Post Oxygen Saturation %  85 %     2 Minute Post Liters of Oxygen  0 L      Service Time 1430-1540    Dr. Emily Filbert is Medical Director for [facility] and [facility].

## 2018-03-17 NOTE — Progress Notes (Signed)
Pulmonary Individual Treatment Plan  Patient Details  Name: [name] MRN: [id number] Date of Birth: 05-18-1961 Referring Provider:     Pulmonary Rehab from 03/17/2018 in [facility] Cardiac and Pulmonary Rehab  Referring Provider  [name] MD      Initial Encounter Date:    Pulmonary Rehab from 03/17/2018 in [facility] Cardiac and Pulmonary Rehab  Date  03/17/18      Visit Diagnosis: Chronic respiratory failure, unspecified whether with hypoxia or hypercapnia ([place])  Patient's Home Medications on Admission:  Current Outpatient Medications:  .  acetaminophen (TYLENOL) 325 MG tablet, Take 1 tablet (325 mg total) by mouth every 6 (six) hours as needed for mild pain (or Fever >/= 101)., Disp: , Rfl:  .  ALPRAZolam (XANAX) 0.5 MG tablet, Take 1 tablet (0.5 mg total) by mouth every 8 (eight) hours as needed for anxiety., Disp: 20 tablet, Rfl: 0 .  AMBULATORY NON FORMULARY MEDICATION, Medication Name: Trachea collar for use with nebulizer DX: J96.10, Disp: 1 each, Rfl: 0 .  AMBULATORY NON FORMULARY MEDICATION, Medication Name: Nebulizer DX: J96.10, Disp: 1 each, Rfl: 0 .  amiodarone (PACERONE) 200 MG tablet, Take 200 mg by mouth 2 (two) times daily., Disp: , Rfl: 8 .  atorvastatin (LIPITOR) 40 MG tablet, Take 1 tablet (40 mg total) by mouth daily at 6 PM., Disp: 30 tablet, Rfl: 1 .  atorvastatin (LIPITOR) 80 MG tablet, Take 80 mg by mouth at bedtime., Disp: , Rfl:  .  budesonide (PULMICORT) 0.5 MG/2ML nebulizer solution, Take 2 mLs (0.5 mg total) by nebulization 2 (two) times daily., Disp: 75 mL, Rfl: 12 .  busPIRone (BUSPAR) 10 MG tablet, Take 10 mg by mouth 2 (two) times daily as needed. , Disp: , Rfl:  .  collagenase (SANTYL) ointment, Apply topically daily., Disp: 15 g, Rfl: 0 .  enalapril (VASOTEC) 2.5 MG tablet, Take 1 tablet (2.5 mg total) by mouth daily., Disp: 30 tablet, Rfl: 0 .  feeding supplement, GLUCERNA SHAKE, (GLUCERNA SHAKE) LIQD, Take 237 mLs by mouth 2 (two) times  daily between meals., Disp: 60 Can, Rfl: 0 .  folic acid (FOLVITE) 1 MG tablet, Take 1 tablet (1 mg total) by mouth daily., Disp: 30 tablet, Rfl: 0 .  furosemide (LASIX) 40 MG tablet, Take 1 tablet (40 mg total) by mouth daily., Disp: 30 tablet, Rfl: 0 .  gabapentin (NEURONTIN) 800 MG tablet, Take 1 tablet (800 mg total) by mouth 3 (three) times daily. 1 tablet twice daily (Patient taking differently: Take 1,600 mg by mouth 2 (two) times daily. ), Disp: 60 tablet, Rfl: 0 .  insulin detemir (LEVEMIR) 100 unit/ml SOLN, Inject 20 Units into the skin 2 (two) times daily. , Disp: , Rfl:  .  insulin lispro (HUMALOG) 100 UNIT/ML injection, Inject 2-10 Units into the skin 3 (three) times daily with meals. , Disp: , Rfl:  .  ipratropium-albuterol (DUONEB) 0.5-2.5 (3) MG/3ML SOLN, Take 3 mLs by nebulization 3 (three) times daily., Disp: 360 mL, Rfl: 1 .  lamoTRIgine (LAMICTAL) 150 MG tablet, Take 150 mg by mouth 2 (two) times daily. , Disp: , Rfl:  .  metFORMIN (GLUCOPHAGE-XR) 500 MG 24 hr tablet, Take 1,000 mg by mouth 2 (two) times daily. , Disp: , Rfl:  .  metoprolol tartrate 75 MG TABS, Take 75 mg by mouth 2 (two) times daily. 100 mg twice daily, Disp: 60 tablet, Rfl: 0 .  Multiple Vitamin (MULTIVITAMIN WITH MINERALS) TABS tablet, Take 1 tablet by mouth daily., Disp: ,  Rfl:  .  omeprazole (PRILOSEC) 20 MG capsule, Take 20 mg by mouth daily. Once daily, Disp: , Rfl:  .  oxyCODONE-acetaminophen (PERCOCET) 10-325 MG tablet, Take 1 tablet by mouth 4 (four) times daily as needed for pain. , Disp: , Rfl:  .  polyethylene glycol (MIRALAX / GLYCOLAX) packet, Take 17 g by mouth daily as needed for mild constipation., Disp: 14 each, Rfl: 0 .  rivaroxaban (XARELTO) 20 MG TABS tablet, Take 20 mg by mouth daily., Disp: , Rfl:  .  senna-docusate (SENOKOT-S) 8.6-50 MG tablet, Take 2 tablets by mouth at bedtime as needed for mild constipation., Disp: , Rfl:  .  thiamine 100 MG tablet, Take 1 tablet (100 mg total) by mouth  daily., Disp: 30 tablet, Rfl: 0  Past Medical History: Past Medical History:  Diagnosis Date  . CHF (congestive heart failure) ([place])   . Diabetes mellitus ([place])   . Diabetic peripheral neuropathy ([place])   . Elevated liver enzymes    fatty liver per [facility]  . GERD (gastroesophageal reflux disease)   . History of pulmonary embolus (PE)   . Hyperlipidemia   . Hypertension   . Myocardial infarct ([place])   . OSA (obstructive sleep apnea)   . 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 Renal insufficiency     Tobacco Use: Social History   Tobacco Use  Smoking Status Never Smoker  Smokeless Tobacco Never Used    Labs: Recent Review Flowsheet Data    Labs for ITP Cardiac and Pulmonary Rehab Latest Ref Rng & Units 07/17/2017 10/08/2017 10/24/2017   Cholestrol 0 - 200 mg/dL 125 - -   LDLCALC 0 - 99 mg/dL 53 - -   HDL >40 mg/dL 56 - -   Trlycerides <150 mg/dL 82 - -   Hemoglobin A1c 4.8 - 5.6 % 6.1(H) 5.7(H) -   HCO3 20.0 - 28.0 mmol/L - - 32.3(H)   O2SAT % - - 80.2       Pulmonary Assessment Scores: Pulmonary Assessment Scores    Row Name 03/17/18 1451         ADL UCSD   ADL Phase  Entry     SOB Score total  63     Rest  2     Walk  2     Stairs  4     Bath  1     Dress  1     Shop  3       CAT Score   CAT Score  25       mMRC Score   mMRC Score  2        Pulmonary Function Assessment: Pulmonary Function Assessment - 03/17/18 1519      Breath   Bilateral Breath Sounds  Clear    Shortness of Breath  Limiting activity;Yes;Panic with Shortness of Breath       Exercise Target Goals: Exercise Program Goal: Individual exercise prescription set using results from initial 6 min walk test and THRR while considering  patient's activity barriers and safety.   Exercise Prescription Goal: Initial exercise prescription builds to 30-45 minutes a day of aerobic activity, 2-3 days per week.  Home exercise guidelines will be given to  patient during program as part of exercise prescription that the participant will acknowledge.  Activity Barriers & Risk Stratification: Activity Barriers & Cardiac Risk Stratification - 03/17/18 1522      Activity Barriers & Cardiac Risk  Stratification   Activity Barriers  Muscular Weakness;Deconditioning;Balance Concerns;History of Falls;Assistive Device;Shortness of Breath;Back Problems;Decreased Ventricular Function   occasional back spasm      6 Minute Walk: 6 Minute Walk    Row Name 03/17/18 1520         6 Minute Walk   Phase  Initial     Distance  525 feet used rollator     Walk Time  6 minutes     # of Rest Breaks  0     MPH  0.99     METS  1.65     RPE  12     Perceived Dyspnea   1     VO2 Peak  5.78     Symptoms  Yes (comment)     Comments  arms got tired, legs got tight, slightly dizzy headed     Resting HR  61 bpm     Resting BP  124/72     Resting Oxygen Saturation   92 %     Exercise Oxygen Saturation  during 6 min walk  80 %     Max Ex. HR  100 bpm     Max Ex. BP  146/64     2 Minute Post BP  136/72       Interval HR   1 Minute HR  75     2 Minute HR  80     3 Minute HR  91     4 Minute HR  98     5 Minute HR  100     6 Minute HR  90     2 Minute Post HR  60     Interval Heart Rate?  Yes       Interval Oxygen   Interval Oxygen?  Yes     Baseline Oxygen Saturation %  92 %     1 Minute Oxygen Saturation %  83 %     1 Minute Liters of Oxygen  0 L Room Air     2 Minute Oxygen Saturation %  82 %     2 Minute Liters of Oxygen  0 L     3 Minute Oxygen Saturation %  82 %     3 Minute Liters of Oxygen  0 L     4 Minute Oxygen Saturation %  82 %     4 Minute Liters of Oxygen  0 L     5 Minute Oxygen Saturation %  80 %     5 Minute Liters of Oxygen  0 L     6 Minute Oxygen Saturation %  82 %     6 Minute Liters of Oxygen  0 L     2 Minute Post Oxygen Saturation %  85 %     2 Minute Post Liters of Oxygen  0 L       Oxygen Initial  Assessment: Oxygen Initial Assessment - 03/17/18 1518      Home Oxygen   Home Oxygen Device  Home Concentrator    Sleep Oxygen Prescription  Continuous    Liters per minute  3.5    Home Exercise Oxygen Prescription  None    Home at Rest Exercise Oxygen Prescription  None    Compliance with Home Oxygen Use  Yes      Initial 6 min Walk   Oxygen Used  None      Program Oxygen Prescription   Program Oxygen Prescription  None      Intervention   Short Term Goals  To learn and exhibit compliance with exercise, home and travel O2 prescription;To learn and understand importance of monitoring SPO2 with pulse oximeter and demonstrate accurate use of the pulse oximeter.;To learn and understand importance of maintaining oxygen saturations>88%;To learn and demonstrate proper pursed lip breathing techniques or other breathing techniques.;To learn and demonstrate proper use of respiratory medications    Long  Term Goals  Exhibits compliance with exercise, home and travel O2 prescription;Verbalizes importance of monitoring SPO2 with pulse oximeter and return demonstration;Maintenance of O2 saturations>88%;Exhibits proper breathing techniques, such as pursed lip breathing or other method taught during program session;Compliance with respiratory medication;Demonstrates proper use of MDI's       Oxygen Re-Evaluation:   Oxygen Discharge (Final Oxygen Re-Evaluation):   Initial Exercise Prescription: Initial Exercise Prescription - 03/17/18 1500      Date of Initial Exercise RX and Referring Provider   Date  03/17/18    Referring Provider  [name] MD      Oxygen   Oxygen  Continuous    Liters  2      Treadmill   MPH  0.8    Grade  0    Minutes  15    METs  1.6      Recumbant Elliptical   Level  1    RPM  50    Minutes  15    METs  1.6      T5 Nustep   Level  1    SPM  80    Minutes  15    METs  1.6      Prescription Details   Frequency (times per week)  3     Duration  Progress to 45 minutes of aerobic exercise without signs/symptoms of physical distress      Intensity   THRR 40-80% of Max Heartrate  102-144    Ratings of Perceived Exertion  11-13    Perceived Dyspnea  0-4      Progression   Progression  Continue to progress workloads to maintain intensity without signs/symptoms of physical distress.      Resistance Training   Training Prescription  Yes    Weight  4 lbs    Reps  10-15       Perform Capillary Blood Glucose checks as needed.  Exercise Prescription Changes: Exercise Prescription Changes    Row Name 03/17/18 1500             Response to Exercise   Blood Pressure (Admit)  124/72       Blood Pressure (Exercise)  146/64       Blood Pressure (Exit)  136/72       Heart Rate (Admit)  61 bpm       Heart Rate (Exercise)  100 bpm       Heart Rate (Exit)  60 bpm       Oxygen Saturation (Admit)  92 %       Oxygen Saturation (Exercise)  80 %       Oxygen Saturation (Exit)  85 %       Rating of Perceived Exertion (Exercise)  12       Perceived Dyspnea (Exercise)  1       Symptoms  SOB, arms and legs tired and tight, slightly dizzy       Comments  walk test results          Exercise Comments:  Exercise Goals and Review: Exercise Goals    Row Name 03/17/18 1526             Exercise Goals   Increase Physical Activity  Yes       Intervention  Provide advice, education, support and counseling about physical activity/exercise needs.;Develop an individualized exercise prescription for aerobic and resistive training based on initial evaluation findings, risk stratification, comorbidities and participant's personal goals.       Expected Outcomes  Short Term: Attend rehab on a regular basis to increase amount of physical activity.;Long Term: Add in home exercise to make exercise part of routine and to increase amount of physical activity.;Long Term: Exercising regularly at least 3-5 days a week.       Increase Strength  and Stamina  Yes       Intervention  Provide advice, education, support and counseling about physical activity/exercise needs.;Develop an individualized exercise prescription for aerobic and resistive training based on initial evaluation findings, risk stratification, comorbidities and participant's personal goals.       Expected Outcomes  Short Term: Increase workloads from initial exercise prescription for resistance, speed, and METs.;Short Term: Perform resistance training exercises routinely during rehab and add in resistance training at home;Long Term: Improve cardiorespiratory fitness, muscular endurance and strength as measured by increased METs and functional capacity (6MWT)       Able to understand and use rate of perceived exertion (RPE) scale  Yes       Intervention  Provide education and explanation on how to use RPE scale       Expected Outcomes  Short Term: Able to use RPE daily in rehab to express subjective intensity level;Long Term:  Able to use RPE to guide intensity level when exercising independently       Able to understand and use Dyspnea scale  Yes       Intervention  Provide education and explanation on how to use Dyspnea scale       Expected Outcomes  Short Term: Able to use Dyspnea scale daily in rehab to express subjective sense of shortness of breath during exertion;Long Term: Able to use Dyspnea scale to guide intensity level when exercising independently       Knowledge and understanding of Target Heart Rate Range (THRR)  Yes       Intervention  Provide education and explanation of THRR including how the numbers were predicted and where they are located for reference       Expected Outcomes  Short Term: Able to state/look up THRR;Short Term: Able to use daily as guideline for intensity in rehab;Long Term: Able to use THRR to govern intensity when exercising independently       Able to check pulse independently  Yes       Intervention  Provide education and demonstration on  how to check pulse in carotid and radial arteries.;Review the importance of being able to check your own pulse for safety during independent exercise       Expected Outcomes  Short Term: Able to explain why pulse checking is important during independent exercise;Long Term: Able to check pulse independently and accurately       Understanding of Exercise Prescription  Yes       Intervention  Provide education, explanation, and written materials on patient's individual exercise prescription       Expected Outcomes  Short Term: Able to explain program exercise prescription;Long Term: Able to explain home exercise prescription to exercise independently  Exercise Goals Re-Evaluation :   Discharge Exercise Prescription (Final Exercise Prescription Changes): Exercise Prescription Changes - 03/17/18 1500      Response to Exercise   Blood Pressure (Admit)  124/72    Blood Pressure (Exercise)  146/64    Blood Pressure (Exit)  136/72    Heart Rate (Admit)  61 bpm    Heart Rate (Exercise)  100 bpm    Heart Rate (Exit)  60 bpm    Oxygen Saturation (Admit)  92 %    Oxygen Saturation (Exercise)  80 %    Oxygen Saturation (Exit)  85 %    Rating of Perceived Exertion (Exercise)  12    Perceived Dyspnea (Exercise)  1    Symptoms  SOB, arms and legs tired and tight, slightly dizzy    Comments  walk test results       Nutrition:  Target Goals: Understanding of nutrition guidelines, daily intake of sodium <1515m, cholesterol <2064m calories 30% from fat and 7% or less from saturated fats, daily to have 5 or more servings of fruits and vegetables.  Biometrics: Pre Biometrics - 03/17/18 1527      Pre Biometrics   Height  6' 1.5" (1.867 m)    Weight  (!) 313 lb 3.2 oz (142.1 kg)    Waist Circumference  53 inches    Hip Circumference  50 inches    Waist to Hip Ratio  1.06 %    BMI (Calculated)  40.76    Single Leg Stand  0 seconds        Nutrition Therapy Plan and Nutrition  Goals: Nutrition Therapy & Goals - 03/17/18 1523      Personal Nutrition Goals   Nutrition Goal  Lose weight    Personal Goal #2  Learn to eat better    Comments  He would like to meet with the dietician. He tries to eat the best he can but eats at late sometime. He states he should eat more vegatables.      Intervention Plan   Intervention  Prescribe, educate and counsel regarding individualized specific dietary modifications aiming towards targeted core components such as weight, hypertension, lipid management, diabetes, heart failure and other comorbidities.;Nutrition handout(s) given to patient.    Expected Outcomes  Short Term Goal: Understand basic principles of dietary content, such as calories, fat, sodium, cholesterol and nutrients.;Long Term Goal: Adherence to prescribed nutrition plan.       Nutrition Assessments:   Nutrition Goals Re-Evaluation:   Nutrition Goals Discharge (Final Nutrition Goals Re-Evaluation):   Psychosocial: Target Goals: Acknowledge presence or absence of significant depression and/or stress, maximize coping skills, provide positive support system. Participant is able to verbalize types and ability to use techniques and skills needed for reducing stress and depression.   Initial Review & Psychosocial Screening: Initial Psych Review & Screening - 03/17/18 1520      Initial Review   Current issues with  Current Depression;History of Depression;Current Stress Concerns    Source of Stress Concerns  Chronic Illness;Unable to perform yard/household activities;Financial    Comments  Financial stress and relationship stress. He is unable to do things that he used to do.      Family Dynamics   Good Support System?  Yes    Comments  He can look to his brothers and his fiance. His mother is also supportive for him.      Barriers   Psychosocial barriers to participate in program  The patient should benefit from  training in stress management and relaxation.       Screening Interventions   Interventions  Encouraged to exercise;To provide support and resources with identified psychosocial needs;Provide feedback about the scores to participant;Program counselor consult    Expected Outcomes  Short Term goal: Utilizing psychosocial counselor, staff and physician to assist with identification of specific Stressors or current issues interfering with healing process. Setting desired goal for each stressor or current issue identified.;Long Term Goal: Stressors or current issues are controlled or eliminated.;Short Term goal: Identification and review with participant of any Quality of Life or Depression concerns found by scoring the questionnaire.;Long Term goal: The participant improves quality of Life and PHQ9 Scores as seen by post scores and/or verbalization of changes       Quality of Life Scores:  Scores of 19 and below usually indicate a poorer quality of life in these areas.  A difference of  2-3 points is a clinically meaningful difference.  A difference of 2-3 points in the total score of the Quality of Life Index has been associated with significant improvement in overall quality of life, self-image, physical symptoms, and general health in studies assessing change in quality of life.  PHQ-9: Recent Review Flowsheet Data    Depression screen [facility] 2/9 03/17/2018   Decreased Interest 3   Down, Depressed, Hopeless 2   PHQ - 2 Score 5   Altered sleeping 2   Tired, decreased energy 2   Change in appetite 1   Feeling bad or failure about yourself  2   Trouble concentrating 1   Moving slowly or fidgety/restless 1   Suicidal thoughts 0   PHQ-9 Score 14   Difficult doing work/chores Very difficult     Interpretation of Total Score  Total Score Depression Severity:  1-4 = Minimal depression, 5-9 = Mild depression, 10-14 = Moderate depression, 15-19 = Moderately severe depression, 20-27 = Severe depression   Psychosocial Evaluation and  Intervention:   Psychosocial Re-Evaluation:   Psychosocial Discharge (Final Psychosocial Re-Evaluation):   Education: Education Goals: Education classes will be provided on a weekly basis, covering required topics. Participant will state understanding/return demonstration of topics presented.  Learning Barriers/Preferences: Learning Barriers/Preferences - 03/17/18 1525      Learning Barriers/Preferences   Learning Barriers  None    Learning Preferences  None       Education Topics:  Initial Evaluation Education: - Verbal, written and demonstration of respiratory meds, oximetry and breathing techniques. Instruction on use of nebulizers and MDIs and importance of monitoring MDI activations.   Pulmonary Rehab from 03/17/2018 in [facility] Cardiac and Pulmonary Rehab  Date  03/17/18  Educator  [facility]  Instruction Review Code  1- Verbalizes Understanding      General Nutrition Guidelines/Fats and Fiber: -Group instruction provided by verbal, written material, models and posters to present the general guidelines for heart healthy nutrition. Gives an explanation and review of dietary fats and fiber.   Controlling Sodium/Reading Food Labels: -Group verbal and written material supporting the discussion of sodium use in heart healthy nutrition. Review and explanation with models, verbal and written materials for utilization of the food label.   Exercise Physiology & General Exercise Guidelines: - Group verbal and written instruction with models to review the exercise physiology of the cardiovascular system and associated critical values. Provides general exercise guidelines with specific guidelines to those with heart or lung disease.    Aerobic Exercise & Resistance Training: - Gives group verbal and written instruction on the various  components of exercise. Focuses on aerobic and resistive training programs and the benefits of this training and how to safely progress through these  programs.   Flexibility, Balance, Mind/Body Relaxation: Provides group verbal/written instruction on the benefits of flexibility and balance training, including mind/body exercise modes such as yoga, pilates and tai chi.  Demonstration and skill practice provided.   Stress and Anxiety: - Provides group verbal and written instruction about the health risks of elevated stress and causes of high stress.  Discuss the correlation between heart/lung disease and anxiety and treatment options. Review healthy ways to manage with stress and anxiety.   Depression: - Provides group verbal and written instruction on the correlation between heart/lung disease and depressed mood, treatment options, and the stigmas associated with seeking treatment.   Exercise & Equipment Safety: - Individual verbal instruction and demonstration of equipment use and safety with use of the equipment.   Pulmonary Rehab from 03/17/2018 in [facility] Cardiac and Pulmonary Rehab  Date  03/17/18  Educator  [facility]  Instruction Review Code  1- Verbalizes Understanding      Infection Prevention: - Provides verbal and written material to individual with discussion of infection control including proper hand washing and proper equipment cleaning during exercise session.   Pulmonary Rehab from 03/17/2018 in [facility] Cardiac and Pulmonary Rehab  Date  03/17/18  Educator  [facility]  Instruction Review Code  1- Verbalizes Understanding      Falls Prevention: - Provides verbal and written material to individual with discussion of falls prevention and safety.   Pulmonary Rehab from 03/17/2018 in [facility] Cardiac and Pulmonary Rehab  Date  03/17/18  Educator  [facility]  Instruction Review Code  1- Verbalizes Understanding      Diabetes: - Individual verbal and written instruction to review signs/symptoms of diabetes, desired ranges of glucose level fasting, after meals and with exercise. Advice that pre and post exercise glucose checks will be done for 3  sessions at entry of program.   Chronic Lung Diseases: - Group verbal and written instruction to review updates, respiratory medications, advancements in procedures and treatments. Discuss use of supplemental oxygen including available portable oxygen systems, continuous and intermittent flow rates, concentrators, personal use and safety guidelines. Review proper use of inhaler and spacers. Provide informative websites for self-education.    Energy Conservation: - Provide group verbal and written instruction for methods to conserve energy, plan and organize activities. Instruct on pacing techniques, use of adaptive equipment and posture/positioning to relieve shortness of breath.   Triggers and Exacerbations: - Group verbal and written instruction to review types of environmental triggers and ways to prevent exacerbations. Discuss weather changes, air quality and the benefits of nasal washing. Review warning signs and symptoms to help prevent infections. Discuss techniques for effective airway clearance, coughing, and vibrations.   AED/CPR: - Group verbal and written instruction with the use of models to demonstrate the basic use of the AED with the basic ABC's of resuscitation.   Anatomy and Physiology of the Lungs: - Group verbal and written instruction with the use of models to provide basic lung anatomy and physiology related to function, structure and complications of lung disease.   Anatomy & Physiology of the Heart: - Group verbal and written instruction and models provide basic cardiac anatomy and physiology, with the coronary electrical and arterial systems. Review of Valvular disease and Heart Failure   Cardiac Medications: - Group verbal and written instruction to review commonly prescribed medications for heart disease. Reviews the medication,  class of the drug, and side effects.   Know Your Numbers and Risk Factors: -Group verbal and written instruction about important  numbers in your health.  Discussion of what are risk factors and how they play a role in the disease process.  Review of Cholesterol, Blood Pressure, Diabetes, and BMI and the role they play in your overall health.   Sleep Hygiene: -Provides group verbal and written instruction about how sleep can affect your health.  Define sleep hygiene, discuss sleep cycles and impact of sleep habits. Review good sleep hygiene tips.    Other: -Provides group and verbal instruction on various topics (see comments)    Knowledge Questionnaire Score: Knowledge Questionnaire Score - 03/17/18 1452      Knowledge Questionnaire Score   Pre Score  16/18   REVIEWED WITH patient       Core Components/Risk Factors/Patient Goals at Admission: Personal Goals and Risk Factors at Admission - 03/17/18 1525      Core Components/Risk Factors/Patient Goals on Admission    Weight Management  Weight Loss    Intervention  Weight Management: Develop a combined nutrition and exercise program designed to reach desired caloric intake, while maintaining appropriate intake of nutrient and fiber, sodium and fats, and appropriate energy expenditure required for the weight goal.;Weight Management/Obesity: Establish reasonable short term and long term weight goals.;Weight Management: Provide education and appropriate resources to help participant work on and attain dietary goals.    Admit Weight  313 lb 3.2 oz (142.1 kg)    Goal Weight: Short Term  308 lb (139.7 kg)    Goal Weight: Long Term  290 lb (131.5 kg)    Expected Outcomes  Short Term: Continue to assess and modify interventions until short term weight is achieved;Long Term: Adherence to nutrition and physical activity/exercise program aimed toward attainment of established weight goal;Understanding recommendations for meals to include 15-35% energy as protein, 25-35% energy from fat, 35-60% energy from carbohydrates, less than 223m of dietary cholesterol, 20-35 gm of total  fiber daily;Understanding of distribution of calorie intake throughout the day with the consumption of 4-5 meals/snacks;Weight Loss: Understanding of general recommendations for a balanced deficit meal plan, which promotes 1-2 lb weight loss per week and includes a negative energy balance of [phone] kcal/d    Improve shortness of breath with ADL's  Yes    Intervention  Provide education, individualized exercise plan and daily activity instruction to help decrease symptoms of SOB with activities of daily living.    Expected Outcomes  Short Term: Improve cardiorespiratory fitness to achieve a reduction of symptoms when performing ADLs;Long Term: Be able to perform more ADLs without symptoms or delay the onset of symptoms    Diabetes  Yes    Intervention  Provide education about signs/symptoms and action to take for hypo/hyperglycemia.;Provide education about proper nutrition, including hydration, and aerobic/resistive exercise prescription along with prescribed medications to achieve blood glucose in normal ranges: Fasting glucose 65-99 mg/dL    Expected Outcomes  Short Term: Participant verbalizes understanding of the signs/symptoms and immediate care of hyper/hypoglycemia, proper foot care and importance of medication, aerobic/resistive exercise and nutrition plan for blood glucose control.;Long Term: Attainment of HbA1C < 7%.   his A1c was 6.5 he states   Heart Failure  Yes   pace maker   Intervention  Provide a combined exercise and nutrition program that is supplemented with education, support and counseling about heart failure. Directed toward relieving symptoms such as shortness of breath, decreased exercise tolerance,  and extremity edema.    Expected Outcomes  Improve functional capacity of life;Short term: Attendance in program 2-3 days a week with increased exercise capacity. Reported lower sodium intake. Reported increased fruit and vegetable intake. Reports medication compliance.;Short term:  Daily weights obtained and reported for increase. Utilizing diuretic protocols set by physician.;Long term: Adoption of self-care skills and reduction of barriers for early signs and symptoms recognition and intervention leading to self-care maintenance.    Hypertension  Yes    Intervention  Provide education on lifestyle modifcations including regular physical activity/exercise, weight management, moderate sodium restriction and increased consumption of fresh fruit, vegetables, and low fat dairy, alcohol moderation, and smoking cessation.;Monitor prescription use compliance.    Expected Outcomes  Short Term: Continued assessment and intervention until BP is < 140/75m HG in hypertensive participants. < 130/827mHG in hypertensive participants with diabetes, heart failure or chronic kidney disease.;Long Term: Maintenance of blood pressure at goal levels.    Lipids  Yes    Intervention  Provide education and support for participant on nutrition & aerobic/resistive exercise along with prescribed medications to achieve LDL <7082mHDL >73m18m  Expected Outcomes  Short Term: Participant states understanding of desired cholesterol values and is compliant with medications prescribed. Participant is following exercise prescription and nutrition guidelines.;Long Term: Cholesterol controlled with medications as prescribed, with individualized exercise RX and with personalized nutrition plan. Value goals: LDL < 70mg64mL > 40 mg.       Core Components/Risk Factors/Patient Goals Review:    Core Components/Risk Factors/Patient Goals at Discharge (Final Review):    ITP Comments: ITP Comments    Row Name 03/17/18 1446           ITP Comments  Medical Evaluation completed. Chart sent for review and changes to Dr. Mark Emily Filbertctor of [facility]          Comments: Initial ITP

## 2018-03-25 ENCOUNTER — Encounter: Payer: Medicare HMO | Attending: Internal Medicine

## 2018-03-25 DIAGNOSIS — I252 Old myocardial infarction: Secondary | ICD-10-CM | POA: Insufficient documentation

## 2018-03-25 DIAGNOSIS — K219 Gastro-esophageal reflux disease without esophagitis: Secondary | ICD-10-CM | POA: Insufficient documentation

## 2018-03-25 DIAGNOSIS — I509 Heart failure, unspecified: Secondary | ICD-10-CM | POA: Insufficient documentation

## 2018-03-25 DIAGNOSIS — N289 Disorder of kidney and ureter, unspecified: Secondary | ICD-10-CM | POA: Insufficient documentation

## 2018-03-25 DIAGNOSIS — G4733 Obstructive sleep apnea (adult) (pediatric): Secondary | ICD-10-CM | POA: Insufficient documentation

## 2018-03-25 DIAGNOSIS — J9610 Chronic respiratory failure, unspecified whether with hypoxia or hypercapnia: Secondary | ICD-10-CM | POA: Insufficient documentation

## 2018-03-25 DIAGNOSIS — Z86711 Personal history of pulmonary embolism: Secondary | ICD-10-CM | POA: Insufficient documentation

## 2018-03-25 DIAGNOSIS — E1142 Type 2 diabetes mellitus with diabetic polyneuropathy: Secondary | ICD-10-CM | POA: Insufficient documentation

## 2018-03-25 DIAGNOSIS — E785 Hyperlipidemia, unspecified: Secondary | ICD-10-CM | POA: Insufficient documentation

## 2018-03-25 DIAGNOSIS — Z79899 Other long term (current) drug therapy: Secondary | ICD-10-CM | POA: Insufficient documentation

## 2018-03-25 DIAGNOSIS — I110 Hypertensive heart disease with heart failure: Secondary | ICD-10-CM | POA: Insufficient documentation

## 2018-03-25 DIAGNOSIS — Z7951 Long term (current) use of inhaled steroids: Secondary | ICD-10-CM | POA: Insufficient documentation

## 2018-03-25 DIAGNOSIS — Z794 Long term (current) use of insulin: Secondary | ICD-10-CM | POA: Insufficient documentation

## 2018-03-25 DIAGNOSIS — Z7901 Long term (current) use of anticoagulants: Secondary | ICD-10-CM | POA: Insufficient documentation

## 2018-04-13 DIAGNOSIS — I509 Heart failure, unspecified: Secondary | ICD-10-CM | POA: Diagnosis not present

## 2018-04-13 DIAGNOSIS — E785 Hyperlipidemia, unspecified: Secondary | ICD-10-CM | POA: Diagnosis not present

## 2018-04-13 DIAGNOSIS — I110 Hypertensive heart disease with heart failure: Secondary | ICD-10-CM | POA: Diagnosis not present

## 2018-04-13 DIAGNOSIS — Z79899 Other long term (current) drug therapy: Secondary | ICD-10-CM | POA: Diagnosis not present

## 2018-04-13 DIAGNOSIS — Z7951 Long term (current) use of inhaled steroids: Secondary | ICD-10-CM | POA: Diagnosis not present

## 2018-04-13 DIAGNOSIS — G4733 Obstructive sleep apnea (adult) (pediatric): Secondary | ICD-10-CM | POA: Diagnosis not present

## 2018-04-13 DIAGNOSIS — N289 Disorder of kidney and ureter, unspecified: Secondary | ICD-10-CM | POA: Diagnosis not present

## 2018-04-13 DIAGNOSIS — J9610 Chronic respiratory failure, unspecified whether with hypoxia or hypercapnia: Secondary | ICD-10-CM

## 2018-04-13 DIAGNOSIS — E1142 Type 2 diabetes mellitus with diabetic polyneuropathy: Secondary | ICD-10-CM | POA: Diagnosis not present

## 2018-04-13 DIAGNOSIS — Z86711 Personal history of pulmonary embolism: Secondary | ICD-10-CM | POA: Diagnosis not present

## 2018-04-13 DIAGNOSIS — Z7901 Long term (current) use of anticoagulants: Secondary | ICD-10-CM | POA: Diagnosis not present

## 2018-04-13 DIAGNOSIS — I252 Old myocardial infarction: Secondary | ICD-10-CM | POA: Diagnosis not present

## 2018-04-13 DIAGNOSIS — Z794 Long term (current) use of insulin: Secondary | ICD-10-CM | POA: Diagnosis not present

## 2018-04-13 DIAGNOSIS — K219 Gastro-esophageal reflux disease without esophagitis: Secondary | ICD-10-CM | POA: Diagnosis not present

## 2018-04-13 LAB — GLUCOSE, CAPILLARY
Glucose-Capillary: 206 mg/dL — ABNORMAL HIGH (ref 70–99)
Glucose-Capillary: 173 mg/dL — ABNORMAL HIGH (ref 70–99)

## 2018-04-13 NOTE — Progress Notes (Signed)
Pulmonary Individual Treatment Plan  Patient Details  Name: [name] MRN: [id number] Date of Birth: 05-18-1961 Referring Provider:     Pulmonary Rehab from 03/17/2018 in [facility] Cardiac and Pulmonary Rehab  Referring Provider  [name] MD      Initial Encounter Date:    Pulmonary Rehab from 03/17/2018 in [facility] Cardiac and Pulmonary Rehab  Date  03/17/18      Visit Diagnosis: Chronic respiratory failure, unspecified whether with hypoxia or hypercapnia ([place])  Patient's Home Medications on Admission:  Current Outpatient Medications:  .  acetaminophen (TYLENOL) 325 MG tablet, Take 1 tablet (325 mg total) by mouth every 6 (six) hours as needed for mild pain (or Fever >/= 101)., Disp: , Rfl:  .  ALPRAZolam (XANAX) 0.5 MG tablet, Take 1 tablet (0.5 mg total) by mouth every 8 (eight) hours as needed for anxiety., Disp: 20 tablet, Rfl: 0 .  AMBULATORY NON FORMULARY MEDICATION, Medication Name: Trachea collar for use with nebulizer DX: J96.10, Disp: 1 each, Rfl: 0 .  AMBULATORY NON FORMULARY MEDICATION, Medication Name: Nebulizer DX: J96.10, Disp: 1 each, Rfl: 0 .  amiodarone (PACERONE) 200 MG tablet, Take 200 mg by mouth 2 (two) times daily., Disp: , Rfl: 8 .  atorvastatin (LIPITOR) 40 MG tablet, Take 1 tablet (40 mg total) by mouth daily at 6 PM., Disp: 30 tablet, Rfl: 1 .  atorvastatin (LIPITOR) 80 MG tablet, Take 80 mg by mouth at bedtime., Disp: , Rfl:  .  budesonide (PULMICORT) 0.5 MG/2ML nebulizer solution, Take 2 mLs (0.5 mg total) by nebulization 2 (two) times daily., Disp: 75 mL, Rfl: 12 .  busPIRone (BUSPAR) 10 MG tablet, Take 10 mg by mouth 2 (two) times daily as needed. , Disp: , Rfl:  .  collagenase (SANTYL) ointment, Apply topically daily., Disp: 15 g, Rfl: 0 .  enalapril (VASOTEC) 2.5 MG tablet, Take 1 tablet (2.5 mg total) by mouth daily., Disp: 30 tablet, Rfl: 0 .  feeding supplement, GLUCERNA SHAKE, (GLUCERNA SHAKE) LIQD, Take 237 mLs by mouth 2 (two) times  daily between meals., Disp: 60 Can, Rfl: 0 .  folic acid (FOLVITE) 1 MG tablet, Take 1 tablet (1 mg total) by mouth daily., Disp: 30 tablet, Rfl: 0 .  furosemide (LASIX) 40 MG tablet, Take 1 tablet (40 mg total) by mouth daily., Disp: 30 tablet, Rfl: 0 .  gabapentin (NEURONTIN) 800 MG tablet, Take 1 tablet (800 mg total) by mouth 3 (three) times daily. 1 tablet twice daily (Patient taking differently: Take 1,600 mg by mouth 2 (two) times daily. ), Disp: 60 tablet, Rfl: 0 .  insulin detemir (LEVEMIR) 100 unit/ml SOLN, Inject 20 Units into the skin 2 (two) times daily. , Disp: , Rfl:  .  insulin lispro (HUMALOG) 100 UNIT/ML injection, Inject 2-10 Units into the skin 3 (three) times daily with meals. , Disp: , Rfl:  .  ipratropium-albuterol (DUONEB) 0.5-2.5 (3) MG/3ML SOLN, Take 3 mLs by nebulization 3 (three) times daily., Disp: 360 mL, Rfl: 1 .  lamoTRIgine (LAMICTAL) 150 MG tablet, Take 150 mg by mouth 2 (two) times daily. , Disp: , Rfl:  .  metFORMIN (GLUCOPHAGE-XR) 500 MG 24 hr tablet, Take 1,000 mg by mouth 2 (two) times daily. , Disp: , Rfl:  .  metoprolol tartrate 75 MG TABS, Take 75 mg by mouth 2 (two) times daily. 100 mg twice daily, Disp: 60 tablet, Rfl: 0 .  Multiple Vitamin (MULTIVITAMIN WITH MINERALS) TABS tablet, Take 1 tablet by mouth daily., Disp: ,  Rfl:  .  omeprazole (PRILOSEC) 20 MG capsule, Take 20 mg by mouth daily. Once daily, Disp: , Rfl:  .  oxyCODONE-acetaminophen (PERCOCET) 10-325 MG tablet, Take 1 tablet by mouth 4 (four) times daily as needed for pain. , Disp: , Rfl:  .  polyethylene glycol (MIRALAX / GLYCOLAX) packet, Take 17 g by mouth daily as needed for mild constipation., Disp: 14 each, Rfl: 0 .  rivaroxaban (XARELTO) 20 MG TABS tablet, Take 20 mg by mouth daily., Disp: , Rfl:  .  senna-docusate (SENOKOT-S) 8.6-50 MG tablet, Take 2 tablets by mouth at bedtime as needed for mild constipation., Disp: , Rfl:  .  thiamine 100 MG tablet, Take 1 tablet (100 mg total) by mouth  daily., Disp: 30 tablet, Rfl: 0  Past Medical History: Past Medical History:  Diagnosis Date  . CHF (congestive heart failure) ([place])   . Diabetes mellitus ([place])   . Diabetic peripheral neuropathy ([place])   . Elevated liver enzymes    fatty liver per [facility]  . GERD (gastroesophageal reflux disease)   . History of pulmonary embolus (PE)   . Hyperlipidemia   . Hypertension   . Myocardial infarct ([place])   . OSA (obstructive sleep apnea)   . 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 Renal insufficiency     Tobacco Use: Social History   Tobacco Use  Smoking Status Never Smoker  Smokeless Tobacco Never Used    Labs: Recent Review Flowsheet Data    Labs for ITP Cardiac and Pulmonary Rehab Latest Ref Rng & Units 07/17/2017 10/08/2017 10/24/2017   Cholestrol 0 - 200 mg/dL 125 - -   LDLCALC 0 - 99 mg/dL 53 - -   HDL >40 mg/dL 56 - -   Trlycerides <150 mg/dL 82 - -   Hemoglobin A1c 4.8 - 5.6 % 6.1(H) 5.7(H) -   HCO3 20.0 - 28.0 mmol/L - - 32.3(H)   O2SAT % - - 80.2       Pulmonary Assessment Scores: Pulmonary Assessment Scores    Row Name 03/17/18 1451         ADL UCSD   ADL Phase  Entry     SOB Score total  63     Rest  2     Walk  2     Stairs  4     Bath  1     Dress  1     Shop  3       CAT Score   CAT Score  25       mMRC Score   mMRC Score  2        Pulmonary Function Assessment: Pulmonary Function Assessment - 03/17/18 1519      Breath   Bilateral Breath Sounds  Clear    Shortness of Breath  Limiting activity;Yes;Panic with Shortness of Breath       Exercise Target Goals: Exercise Program Goal: Individual exercise prescription set using results from initial 6 min walk test and THRR while considering  patient's activity barriers and safety.   Exercise Prescription Goal: Initial exercise prescription builds to 30-45 minutes a day of aerobic activity, 2-3 days per week.  Home exercise guidelines will be given to  patient during program as part of exercise prescription that the participant will acknowledge.  Activity Barriers & Risk Stratification: Activity Barriers & Cardiac Risk Stratification - 03/17/18 1522      Activity Barriers & Cardiac Risk  Stratification   Activity Barriers  Muscular Weakness;Deconditioning;Balance Concerns;History of Falls;Assistive Device;Shortness of Breath;Back Problems;Decreased Ventricular Function   occasional back spasm      6 Minute Walk: 6 Minute Walk    Row Name 03/17/18 1520         6 Minute Walk   Phase  Initial     Distance  525 feet used rollator     Walk Time  6 minutes     # of Rest Breaks  0     MPH  0.99     METS  1.65     RPE  12     Perceived Dyspnea   1     VO2 Peak  5.78     Symptoms  Yes (comment)     Comments  arms got tired, legs got tight, slightly dizzy headed     Resting HR  61 bpm     Resting BP  124/72     Resting Oxygen Saturation   92 %     Exercise Oxygen Saturation  during 6 min walk  80 %     Max Ex. HR  100 bpm     Max Ex. BP  146/64     2 Minute Post BP  136/72       Interval HR   1 Minute HR  75     2 Minute HR  80     3 Minute HR  91     4 Minute HR  98     5 Minute HR  100     6 Minute HR  90     2 Minute Post HR  60     Interval Heart Rate?  Yes       Interval Oxygen   Interval Oxygen?  Yes     Baseline Oxygen Saturation %  92 %     1 Minute Oxygen Saturation %  83 %     1 Minute Liters of Oxygen  0 L Room Air     2 Minute Oxygen Saturation %  82 %     2 Minute Liters of Oxygen  0 L     3 Minute Oxygen Saturation %  82 %     3 Minute Liters of Oxygen  0 L     4 Minute Oxygen Saturation %  82 %     4 Minute Liters of Oxygen  0 L     5 Minute Oxygen Saturation %  80 %     5 Minute Liters of Oxygen  0 L     6 Minute Oxygen Saturation %  82 %     6 Minute Liters of Oxygen  0 L     2 Minute Post Oxygen Saturation %  85 %     2 Minute Post Liters of Oxygen  0 L       Oxygen Initial  Assessment: Oxygen Initial Assessment - 03/17/18 1518      Home Oxygen   Home Oxygen Device  Home Concentrator    Sleep Oxygen Prescription  Continuous    Liters per minute  3.5    Home Exercise Oxygen Prescription  None    Home at Rest Exercise Oxygen Prescription  None    Compliance with Home Oxygen Use  Yes      Initial 6 min Walk   Oxygen Used  None      Program Oxygen Prescription   Program Oxygen Prescription  None      Intervention   Short Term Goals  To learn and exhibit compliance with exercise, home and travel O2 prescription;To learn and understand importance of monitoring SPO2 with pulse oximeter and demonstrate accurate use of the pulse oximeter.;To learn and understand importance of maintaining oxygen saturations>88%;To learn and demonstrate proper pursed lip breathing techniques or other breathing techniques.;To learn and demonstrate proper use of respiratory medications    Long  Term Goals  Exhibits compliance with exercise, home and travel O2 prescription;Verbalizes importance of monitoring SPO2 with pulse oximeter and return demonstration;Maintenance of O2 saturations>88%;Exhibits proper breathing techniques, such as pursed lip breathing or other method taught during program session;Compliance with respiratory medication;Demonstrates proper use of MDI's       Oxygen Re-Evaluation:   Oxygen Discharge (Final Oxygen Re-Evaluation):   Initial Exercise Prescription: Initial Exercise Prescription - 03/17/18 1500      Date of Initial Exercise RX and Referring Provider   Date  03/17/18    Referring Provider  [name] MD      Oxygen   Oxygen  Continuous    Liters  2      Treadmill   MPH  0.8    Grade  0    Minutes  15    METs  1.6      Recumbant Elliptical   Level  1    RPM  50    Minutes  15    METs  1.6      T5 Nustep   Level  1    SPM  80    Minutes  15    METs  1.6      Prescription Details   Frequency (times per week)  3     Duration  Progress to 45 minutes of aerobic exercise without signs/symptoms of physical distress      Intensity   THRR 40-80% of Max Heartrate  102-144    Ratings of Perceived Exertion  11-13    Perceived Dyspnea  0-4      Progression   Progression  Continue to progress workloads to maintain intensity without signs/symptoms of physical distress.      Resistance Training   Training Prescription  Yes    Weight  4 lbs    Reps  10-15       Perform Capillary Blood Glucose checks as needed.  Exercise Prescription Changes: Exercise Prescription Changes    Row Name 03/17/18 1500             Response to Exercise   Blood Pressure (Admit)  124/72       Blood Pressure (Exercise)  146/64       Blood Pressure (Exit)  136/72       Heart Rate (Admit)  61 bpm       Heart Rate (Exercise)  100 bpm       Heart Rate (Exit)  60 bpm       Oxygen Saturation (Admit)  92 %       Oxygen Saturation (Exercise)  80 %       Oxygen Saturation (Exit)  85 %       Rating of Perceived Exertion (Exercise)  12       Perceived Dyspnea (Exercise)  1       Symptoms  SOB, arms and legs tired and tight, slightly dizzy       Comments  walk test results          Exercise Comments:  Exercise Goals and Review: Exercise Goals    Row Name 03/17/18 1526             Exercise Goals   Increase Physical Activity  Yes       Intervention  Provide advice, education, support and counseling about physical activity/exercise needs.;Develop an individualized exercise prescription for aerobic and resistive training based on initial evaluation findings, risk stratification, comorbidities and participant's personal goals.       Expected Outcomes  Short Term: Attend rehab on a regular basis to increase amount of physical activity.;Long Term: Add in home exercise to make exercise part of routine and to increase amount of physical activity.;Long Term: Exercising regularly at least 3-5 days a week.       Increase Strength  and Stamina  Yes       Intervention  Provide advice, education, support and counseling about physical activity/exercise needs.;Develop an individualized exercise prescription for aerobic and resistive training based on initial evaluation findings, risk stratification, comorbidities and participant's personal goals.       Expected Outcomes  Short Term: Increase workloads from initial exercise prescription for resistance, speed, and METs.;Short Term: Perform resistance training exercises routinely during rehab and add in resistance training at home;Long Term: Improve cardiorespiratory fitness, muscular endurance and strength as measured by increased METs and functional capacity (6MWT)       Able to understand and use rate of perceived exertion (RPE) scale  Yes       Intervention  Provide education and explanation on how to use RPE scale       Expected Outcomes  Short Term: Able to use RPE daily in rehab to express subjective intensity level;Long Term:  Able to use RPE to guide intensity level when exercising independently       Able to understand and use Dyspnea scale  Yes       Intervention  Provide education and explanation on how to use Dyspnea scale       Expected Outcomes  Short Term: Able to use Dyspnea scale daily in rehab to express subjective sense of shortness of breath during exertion;Long Term: Able to use Dyspnea scale to guide intensity level when exercising independently       Knowledge and understanding of Target Heart Rate Range (THRR)  Yes       Intervention  Provide education and explanation of THRR including how the numbers were predicted and where they are located for reference       Expected Outcomes  Short Term: Able to state/look up THRR;Short Term: Able to use daily as guideline for intensity in rehab;Long Term: Able to use THRR to govern intensity when exercising independently       Able to check pulse independently  Yes       Intervention  Provide education and demonstration on  how to check pulse in carotid and radial arteries.;Review the importance of being able to check your own pulse for safety during independent exercise       Expected Outcomes  Short Term: Able to explain why pulse checking is important during independent exercise;Long Term: Able to check pulse independently and accurately       Understanding of Exercise Prescription  Yes       Intervention  Provide education, explanation, and written materials on patient's individual exercise prescription       Expected Outcomes  Short Term: Able to explain program exercise prescription;Long Term: Able to explain home exercise prescription to exercise independently  Exercise Goals Re-Evaluation :   Discharge Exercise Prescription (Final Exercise Prescription Changes): Exercise Prescription Changes - 03/17/18 1500      Response to Exercise   Blood Pressure (Admit)  124/72    Blood Pressure (Exercise)  146/64    Blood Pressure (Exit)  136/72    Heart Rate (Admit)  61 bpm    Heart Rate (Exercise)  100 bpm    Heart Rate (Exit)  60 bpm    Oxygen Saturation (Admit)  92 %    Oxygen Saturation (Exercise)  80 %    Oxygen Saturation (Exit)  85 %    Rating of Perceived Exertion (Exercise)  12    Perceived Dyspnea (Exercise)  1    Symptoms  SOB, arms and legs tired and tight, slightly dizzy    Comments  walk test results       Nutrition:  Target Goals: Understanding of nutrition guidelines, daily intake of sodium <1515m, cholesterol <2064m calories 30% from fat and 7% or less from saturated fats, daily to have 5 or more servings of fruits and vegetables.  Biometrics: Pre Biometrics - 03/17/18 1527      Pre Biometrics   Height  6' 1.5" (1.867 m)    Weight  (!) 313 lb 3.2 oz (142.1 kg)    Waist Circumference  53 inches    Hip Circumference  50 inches    Waist to Hip Ratio  1.06 %    BMI (Calculated)  40.76    Single Leg Stand  0 seconds        Nutrition Therapy Plan and Nutrition  Goals: Nutrition Therapy & Goals - 03/17/18 1523      Personal Nutrition Goals   Nutrition Goal  Lose weight    Personal Goal #2  Learn to eat better    Comments  He would like to meet with the dietician. He tries to eat the best he can but eats at late sometime. He states he should eat more vegatables.      Intervention Plan   Intervention  Prescribe, educate and counsel regarding individualized specific dietary modifications aiming towards targeted core components such as weight, hypertension, lipid management, diabetes, heart failure and other comorbidities.;Nutrition handout(s) given to patient.    Expected Outcomes  Short Term Goal: Understand basic principles of dietary content, such as calories, fat, sodium, cholesterol and nutrients.;Long Term Goal: Adherence to prescribed nutrition plan.       Nutrition Assessments:   Nutrition Goals Re-Evaluation:   Nutrition Goals Discharge (Final Nutrition Goals Re-Evaluation):   Psychosocial: Target Goals: Acknowledge presence or absence of significant depression and/or stress, maximize coping skills, provide positive support system. Participant is able to verbalize types and ability to use techniques and skills needed for reducing stress and depression.   Initial Review & Psychosocial Screening: Initial Psych Review & Screening - 03/17/18 1520      Initial Review   Current issues with  Current Depression;History of Depression;Current Stress Concerns    Source of Stress Concerns  Chronic Illness;Unable to perform yard/household activities;Financial    Comments  Financial stress and relationship stress. He is unable to do things that he used to do.      Family Dynamics   Good Support System?  Yes    Comments  He can look to his brothers and his fiance. His mother is also supportive for him.      Barriers   Psychosocial barriers to participate in program  The patient should benefit from  training in stress management and relaxation.       Screening Interventions   Interventions  Encouraged to exercise;To provide support and resources with identified psychosocial needs;Provide feedback about the scores to participant;Program counselor consult    Expected Outcomes  Short Term goal: Utilizing psychosocial counselor, staff and physician to assist with identification of specific Stressors or current issues interfering with healing process. Setting desired goal for each stressor or current issue identified.;Long Term Goal: Stressors or current issues are controlled or eliminated.;Short Term goal: Identification and review with participant of any Quality of Life or Depression concerns found by scoring the questionnaire.;Long Term goal: The participant improves quality of Life and PHQ9 Scores as seen by post scores and/or verbalization of changes       Quality of Life Scores:  Scores of 19 and below usually indicate a poorer quality of life in these areas.  A difference of  2-3 points is a clinically meaningful difference.  A difference of 2-3 points in the total score of the Quality of Life Index has been associated with significant improvement in overall quality of life, self-image, physical symptoms, and general health in studies assessing change in quality of life.  PHQ-9: Recent Review Flowsheet Data    Depression screen [facility] 2/9 03/17/2018   Decreased Interest 3   Down, Depressed, Hopeless 2   PHQ - 2 Score 5   Altered sleeping 2   Tired, decreased energy 2   Change in appetite 1   Feeling bad or failure about yourself  2   Trouble concentrating 1   Moving slowly or fidgety/restless 1   Suicidal thoughts 0   PHQ-9 Score 14   Difficult doing work/chores Very difficult     Interpretation of Total Score  Total Score Depression Severity:  1-4 = Minimal depression, 5-9 = Mild depression, 10-14 = Moderate depression, 15-19 = Moderately severe depression, 20-27 = Severe depression   Psychosocial Evaluation and  Intervention:   Psychosocial Re-Evaluation:   Psychosocial Discharge (Final Psychosocial Re-Evaluation):   Education: Education Goals: Education classes will be provided on a weekly basis, covering required topics. Participant will state understanding/return demonstration of topics presented.  Learning Barriers/Preferences: Learning Barriers/Preferences - 03/17/18 1525      Learning Barriers/Preferences   Learning Barriers  None    Learning Preferences  None       Education Topics:  Initial Evaluation Education: - Verbal, written and demonstration of respiratory meds, oximetry and breathing techniques. Instruction on use of nebulizers and MDIs and importance of monitoring MDI activations.   Pulmonary Rehab from 03/17/2018 in [facility] Cardiac and Pulmonary Rehab  Date  03/17/18  Educator  [facility]  Instruction Review Code  1- Verbalizes Understanding      General Nutrition Guidelines/Fats and Fiber: -Group instruction provided by verbal, written material, models and posters to present the general guidelines for heart healthy nutrition. Gives an explanation and review of dietary fats and fiber.   Controlling Sodium/Reading Food Labels: -Group verbal and written material supporting the discussion of sodium use in heart healthy nutrition. Review and explanation with models, verbal and written materials for utilization of the food label.   Exercise Physiology & General Exercise Guidelines: - Group verbal and written instruction with models to review the exercise physiology of the cardiovascular system and associated critical values. Provides general exercise guidelines with specific guidelines to those with heart or lung disease.    Aerobic Exercise & Resistance Training: - Gives group verbal and written instruction on the various  components of exercise. Focuses on aerobic and resistive training programs and the benefits of this training and how to safely progress through these  programs.   Flexibility, Balance, Mind/Body Relaxation: Provides group verbal/written instruction on the benefits of flexibility and balance training, including mind/body exercise modes such as yoga, pilates and tai chi.  Demonstration and skill practice provided.   Stress and Anxiety: - Provides group verbal and written instruction about the health risks of elevated stress and causes of high stress.  Discuss the correlation between heart/lung disease and anxiety and treatment options. Review healthy ways to manage with stress and anxiety.   Depression: - Provides group verbal and written instruction on the correlation between heart/lung disease and depressed mood, treatment options, and the stigmas associated with seeking treatment.   Exercise & Equipment Safety: - Individual verbal instruction and demonstration of equipment use and safety with use of the equipment.   Pulmonary Rehab from 03/17/2018 in [facility] Cardiac and Pulmonary Rehab  Date  03/17/18  Educator  [facility]  Instruction Review Code  1- Verbalizes Understanding      Infection Prevention: - Provides verbal and written material to individual with discussion of infection control including proper hand washing and proper equipment cleaning during exercise session.   Pulmonary Rehab from 03/17/2018 in [facility] Cardiac and Pulmonary Rehab  Date  03/17/18  Educator  [facility]  Instruction Review Code  1- Verbalizes Understanding      Falls Prevention: - Provides verbal and written material to individual with discussion of falls prevention and safety.   Pulmonary Rehab from 03/17/2018 in [facility] Cardiac and Pulmonary Rehab  Date  03/17/18  Educator  [facility]  Instruction Review Code  1- Verbalizes Understanding      Diabetes: - Individual verbal and written instruction to review signs/symptoms of diabetes, desired ranges of glucose level fasting, after meals and with exercise. Advice that pre and post exercise glucose checks will be done for 3  sessions at entry of program.   Chronic Lung Diseases: - Group verbal and written instruction to review updates, respiratory medications, advancements in procedures and treatments. Discuss use of supplemental oxygen including available portable oxygen systems, continuous and intermittent flow rates, concentrators, personal use and safety guidelines. Review proper use of inhaler and spacers. Provide informative websites for self-education.    Energy Conservation: - Provide group verbal and written instruction for methods to conserve energy, plan and organize activities. Instruct on pacing techniques, use of adaptive equipment and posture/positioning to relieve shortness of breath.   Triggers and Exacerbations: - Group verbal and written instruction to review types of environmental triggers and ways to prevent exacerbations. Discuss weather changes, air quality and the benefits of nasal washing. Review warning signs and symptoms to help prevent infections. Discuss techniques for effective airway clearance, coughing, and vibrations.   AED/CPR: - Group verbal and written instruction with the use of models to demonstrate the basic use of the AED with the basic ABC's of resuscitation.   Anatomy and Physiology of the Lungs: - Group verbal and written instruction with the use of models to provide basic lung anatomy and physiology related to function, structure and complications of lung disease.   Anatomy & Physiology of the Heart: - Group verbal and written instruction and models provide basic cardiac anatomy and physiology, with the coronary electrical and arterial systems. Review of Valvular disease and Heart Failure   Cardiac Medications: - Group verbal and written instruction to review commonly prescribed medications for heart disease. Reviews the medication,  class of the drug, and side effects.   Know Your Numbers and Risk Factors: -Group verbal and written instruction about important  numbers in your health.  Discussion of what are risk factors and how they play a role in the disease process.  Review of Cholesterol, Blood Pressure, Diabetes, and BMI and the role they play in your overall health.   Sleep Hygiene: -Provides group verbal and written instruction about how sleep can affect your health.  Define sleep hygiene, discuss sleep cycles and impact of sleep habits. Review good sleep hygiene tips.    Other: -Provides group and verbal instruction on various topics (see comments)    Knowledge Questionnaire Score: Knowledge Questionnaire Score - 03/17/18 1452      Knowledge Questionnaire Score   Pre Score  16/18   REVIEWED WITH patient       Core Components/Risk Factors/Patient Goals at Admission: Personal Goals and Risk Factors at Admission - 03/17/18 1525      Core Components/Risk Factors/Patient Goals on Admission    Weight Management  Weight Loss    Intervention  Weight Management: Develop a combined nutrition and exercise program designed to reach desired caloric intake, while maintaining appropriate intake of nutrient and fiber, sodium and fats, and appropriate energy expenditure required for the weight goal.;Weight Management/Obesity: Establish reasonable short term and long term weight goals.;Weight Management: Provide education and appropriate resources to help participant work on and attain dietary goals.    Admit Weight  313 lb 3.2 oz (142.1 kg)    Goal Weight: Short Term  308 lb (139.7 kg)    Goal Weight: Long Term  290 lb (131.5 kg)    Expected Outcomes  Short Term: Continue to assess and modify interventions until short term weight is achieved;Long Term: Adherence to nutrition and physical activity/exercise program aimed toward attainment of established weight goal;Understanding recommendations for meals to include 15-35% energy as protein, 25-35% energy from fat, 35-60% energy from carbohydrates, less than 223m of dietary cholesterol, 20-35 gm of total  fiber daily;Understanding of distribution of calorie intake throughout the day with the consumption of 4-5 meals/snacks;Weight Loss: Understanding of general recommendations for a balanced deficit meal plan, which promotes 1-2 lb weight loss per week and includes a negative energy balance of [phone] kcal/d    Improve shortness of breath with ADL's  Yes    Intervention  Provide education, individualized exercise plan and daily activity instruction to help decrease symptoms of SOB with activities of daily living.    Expected Outcomes  Short Term: Improve cardiorespiratory fitness to achieve a reduction of symptoms when performing ADLs;Long Term: Be able to perform more ADLs without symptoms or delay the onset of symptoms    Diabetes  Yes    Intervention  Provide education about signs/symptoms and action to take for hypo/hyperglycemia.;Provide education about proper nutrition, including hydration, and aerobic/resistive exercise prescription along with prescribed medications to achieve blood glucose in normal ranges: Fasting glucose 65-99 mg/dL    Expected Outcomes  Short Term: Participant verbalizes understanding of the signs/symptoms and immediate care of hyper/hypoglycemia, proper foot care and importance of medication, aerobic/resistive exercise and nutrition plan for blood glucose control.;Long Term: Attainment of HbA1C < 7%.   his A1c was 6.5 he states   Heart Failure  Yes   pace maker   Intervention  Provide a combined exercise and nutrition program that is supplemented with education, support and counseling about heart failure. Directed toward relieving symptoms such as shortness of breath, decreased exercise tolerance,  and extremity edema.    Expected Outcomes  Improve functional capacity of life;Short term: Attendance in program 2-3 days a week with increased exercise capacity. Reported lower sodium intake. Reported increased fruit and vegetable intake. Reports medication compliance.;Short term:  Daily weights obtained and reported for increase. Utilizing diuretic protocols set by physician.;Long term: Adoption of self-care skills and reduction of barriers for early signs and symptoms recognition and intervention leading to self-care maintenance.    Hypertension  Yes    Intervention  Provide education on lifestyle modifcations including regular physical activity/exercise, weight management, moderate sodium restriction and increased consumption of fresh fruit, vegetables, and low fat dairy, alcohol moderation, and smoking cessation.;Monitor prescription use compliance.    Expected Outcomes  Short Term: Continued assessment and intervention until BP is < 140/54m HG in hypertensive participants. < 130/830mHG in hypertensive participants with diabetes, heart failure or chronic kidney disease.;Long Term: Maintenance of blood pressure at goal levels.    Lipids  Yes    Intervention  Provide education and support for participant on nutrition & aerobic/resistive exercise along with prescribed medications to achieve LDL <7019mHDL >13m42m  Expected Outcomes  Short Term: Participant states understanding of desired cholesterol values and is compliant with medications prescribed. Participant is following exercise prescription and nutrition guidelines.;Long Term: Cholesterol controlled with medications as prescribed, with individualized exercise RX and with personalized nutrition plan. Value goals: LDL < 70mg54mL > 40 mg.       Core Components/Risk Factors/Patient Goals Review:    Core Components/Risk Factors/Patient Goals at Discharge (Final Review):    ITP Comments: ITP Comments    Row Name 03/17/18 1446 04/13/18 0855         ITP Comments  Medical Evaluation completed. Chart sent for review and changes to Dr. Mark Emily Filbertctor of [facility] be found in [facility] encounter 02/27/18  30 day review completed. ITP sent to Dr. Mark Emily Filbertctor of [facility] with ITP unless  changes are made by physician.         Comments: 30 day review

## 2018-04-13 NOTE — Progress Notes (Signed)
Daily Session Note  Patient Details  Name: [name] MRN: [id number] Date of Birth: 02-Nov-1960 Referring Provider:     Pulmonary Rehab from 03/17/2018 in [facility] Card[facility] and Pul[facility] Re[facility]  Referring Provider  [name] MD      Encounter Date: 04/13/2018  Check In:      Social History   Tobacco Use  Smoking Status Never Smoker  Smokeless Tobacco Never Used    Goals Met:  Proper associated with RPD/PD & O2 Sat Exercise tolerated well Personal goals reviewed Strength training completed today  Goals Unmet:  Not Applicable  Comments: First full day of exercise!  Patient was oriented to gym and equipment including functions, settings, policies, and procedures.  Patient's individual exercise prescription and treatment plan were reviewed.  All starting workloads were established based on the results of the 6 minute walk test done at initial orientation visit.  The plan for exercise progression was also introduced and progression will be customized based on patient's performance and goals.    Dr. Emily Filbert is Medical Director for [facility] and [facility].

## 2018-04-15 ENCOUNTER — Encounter: Payer: Medicare HMO | Admitting: *Deleted

## 2018-04-15 DIAGNOSIS — J9610 Chronic respiratory failure, unspecified whether with hypoxia or hypercapnia: Secondary | ICD-10-CM | POA: Diagnosis not present

## 2018-04-15 LAB — GLUCOSE, CAPILLARY
Glucose-Capillary: 117 mg/dL — ABNORMAL HIGH (ref 70–99)
Glucose-Capillary: 137 mg/dL — ABNORMAL HIGH (ref 70–99)

## 2018-04-15 NOTE — Progress Notes (Signed)
Daily Session Note  Patient Details  Name: [name] MRN: [id number] Date of Birth: Jan 24, 1961 Referring Provider:     Pulmonary Rehab from 03/17/2018 in [facility] Cardiac and Pulmonary Rehab  Referring Provider  [name] MD      Encounter Date: 04/15/2018  Check In: Session Check In - 04/15/18 1128      Check-In   Supervising physician immediately available to respond to emergencies  [facility] immediately available ER MD    Physician(s)  Drs. Veda Canning    Location  [facility]-Cardiac & Pulmonary Rehab    Staff Present  Renita Papa, RN BSN;Maxemiliano Riel Luan [place], [place], RCEP, CCRP, Exercise Physiologist;Joseph Tessie Fass RCP,RRT,BSRT    Medication changes reported      No    Fall or balance concerns reported     No    Warm-up and Cool-down  Performed as group-led instruction    Resistance Training Performed  Yes    VAD Patient?  No    PAD/SET Patient?  No      Pain Assessment   Currently in Pain?  No/denies          Social History   Tobacco Use  Smoking Status Never Smoker  Smokeless Tobacco Never Used    Goals Met:  Proper associated with RPD/PD & O2 Sat Independence with exercise equipment Using PLB without cueing & demonstrates good technique Exercise tolerated well No report of cardiac concerns or symptoms Strength training completed today  Goals Unmet:  Not Applicable  Comments: Pt able to follow exercise prescription today without complaint.  Will continue to monitor for progression.    Dr. Emily Filbert is Medical Director for [place] and [facility].

## 2018-04-24 ENCOUNTER — Encounter: Payer: Medicare HMO | Attending: Internal Medicine

## 2018-04-24 DIAGNOSIS — Z79899 Other long term (current) drug therapy: Secondary | ICD-10-CM | POA: Insufficient documentation

## 2018-04-24 DIAGNOSIS — Z7901 Long term (current) use of anticoagulants: Secondary | ICD-10-CM | POA: Insufficient documentation

## 2018-04-24 DIAGNOSIS — Z794 Long term (current) use of insulin: Secondary | ICD-10-CM | POA: Insufficient documentation

## 2018-04-24 DIAGNOSIS — Z86711 Personal history of pulmonary embolism: Secondary | ICD-10-CM | POA: Insufficient documentation

## 2018-04-24 DIAGNOSIS — I110 Hypertensive heart disease with heart failure: Secondary | ICD-10-CM | POA: Insufficient documentation

## 2018-04-24 DIAGNOSIS — Z7951 Long term (current) use of inhaled steroids: Secondary | ICD-10-CM | POA: Insufficient documentation

## 2018-04-24 DIAGNOSIS — J9610 Chronic respiratory failure, unspecified whether with hypoxia or hypercapnia: Secondary | ICD-10-CM | POA: Insufficient documentation

## 2018-04-24 DIAGNOSIS — G4733 Obstructive sleep apnea (adult) (pediatric): Secondary | ICD-10-CM | POA: Insufficient documentation

## 2018-04-24 DIAGNOSIS — K219 Gastro-esophageal reflux disease without esophagitis: Secondary | ICD-10-CM | POA: Insufficient documentation

## 2018-04-24 DIAGNOSIS — I252 Old myocardial infarction: Secondary | ICD-10-CM | POA: Insufficient documentation

## 2018-04-24 DIAGNOSIS — E1142 Type 2 diabetes mellitus with diabetic polyneuropathy: Secondary | ICD-10-CM | POA: Insufficient documentation

## 2018-04-24 DIAGNOSIS — I509 Heart failure, unspecified: Secondary | ICD-10-CM | POA: Insufficient documentation

## 2018-04-24 DIAGNOSIS — N289 Disorder of kidney and ureter, unspecified: Secondary | ICD-10-CM | POA: Insufficient documentation

## 2018-04-24 DIAGNOSIS — E785 Hyperlipidemia, unspecified: Secondary | ICD-10-CM | POA: Insufficient documentation

## 2018-04-28 ENCOUNTER — Encounter: Payer: Self-pay | Admitting: *Deleted

## 2018-04-28 ENCOUNTER — Telehealth: Payer: Self-pay | Admitting: *Deleted

## 2018-04-28 NOTE — Telephone Encounter (Signed)
Called to check on pt.  He has been out since 04/15/18.  He said that last week he was out sick and that on Monday he couldn't get a ride scheduled.  He has a ride already set up for tomorrow and hopes to be here then.

## 2018-05-10 IMAGING — DX DG CHEST 1V PORT
1 series · 1 of 1 positions shown · non-contrast
Comparison: 07/17/2017

CLINICAL DATA: Shortness of breath. Heart palpitations. Feels bad
all day.

EXAM:
PORTABLE CHEST 1 VIEW

[chest ap]
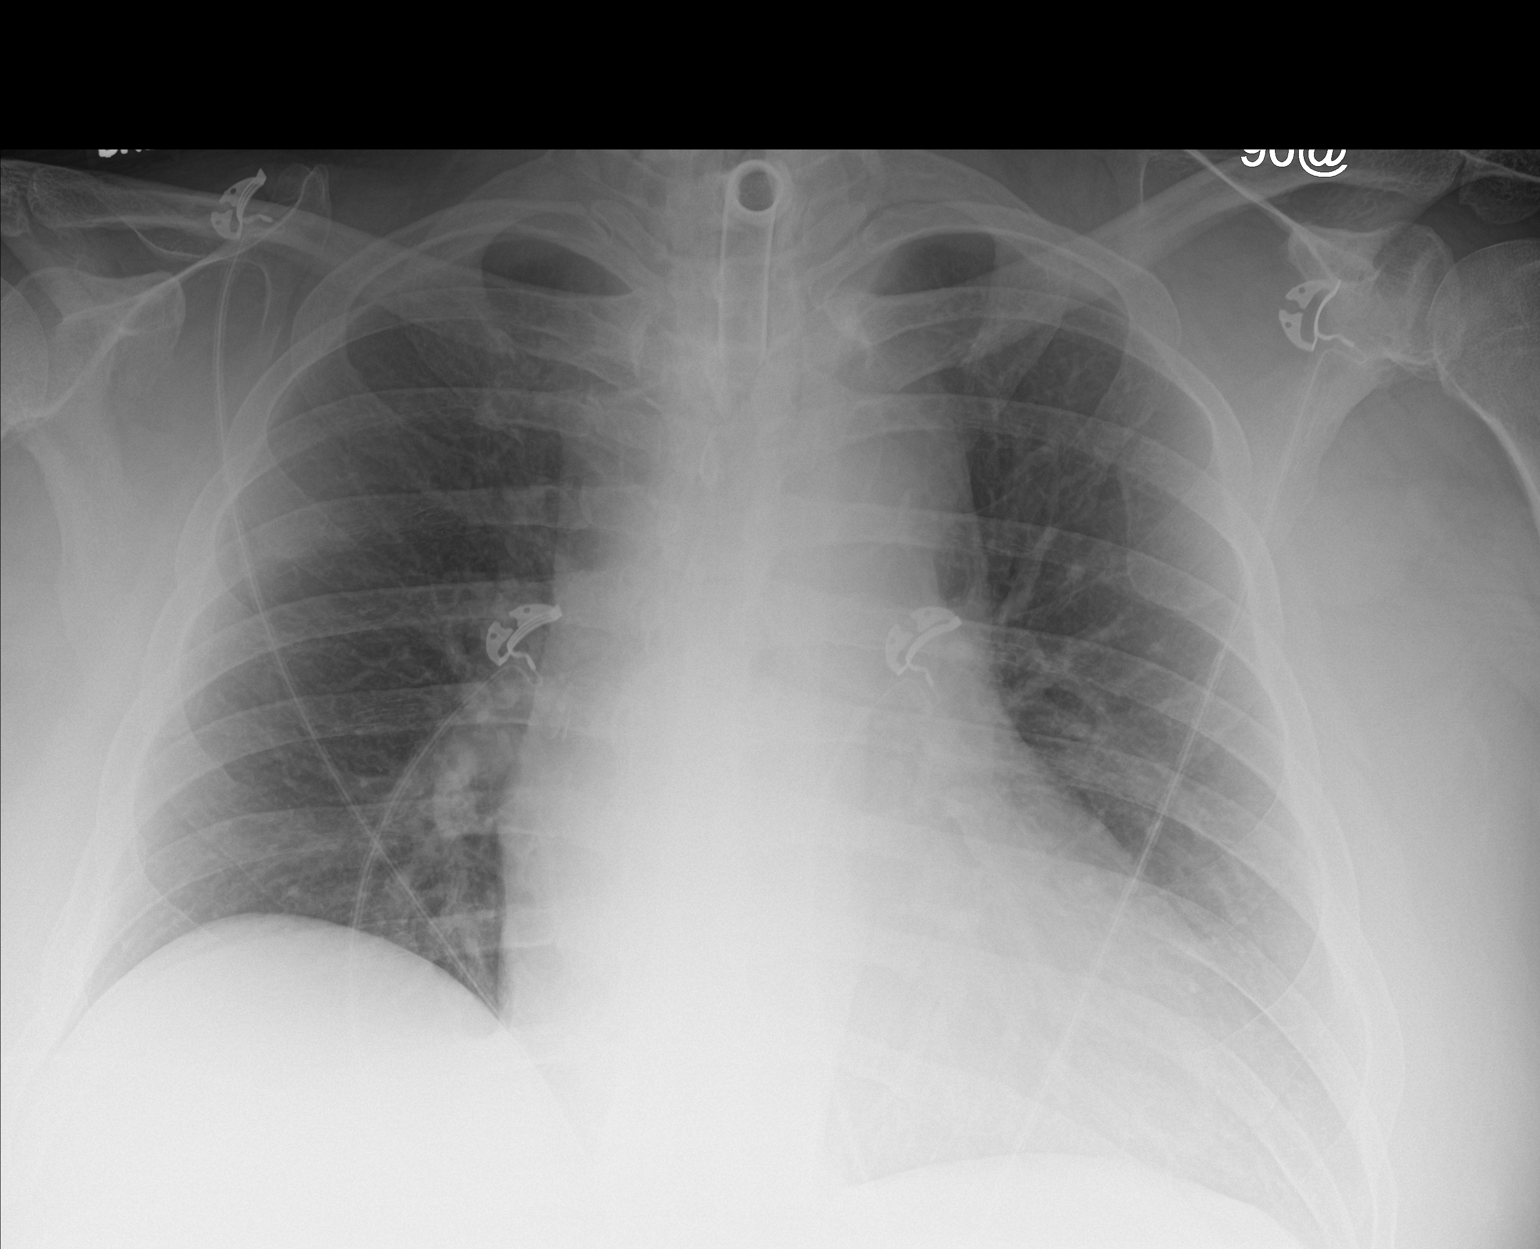

[1 of 1 positions shown; findings below may reference images not displayed]

FINDINGS: Tracheostomy. Shallow inspiration with elevation of the right
hemidiaphragm. Mild cardiac enlargement. No vascular congestion or
edema. Lungs are clear. No blunting of costophrenic angles. No
pneumothorax.
IMPRESSION: Cardiac enlargement.  No evidence of active pulmonary disease.

## 2018-05-11 ENCOUNTER — Telehealth: Payer: Self-pay

## 2018-05-11 DIAGNOSIS — J9610 Chronic respiratory failure, unspecified whether with hypoxia or hypercapnia: Secondary | ICD-10-CM

## 2018-05-11 IMAGING — CT CT ANGIO CHEST
2 of 6 series · 18 of 46 positions shown · IV contrast (APPLIED)
Comparison: 11/02/2016

CLINICAL DATA: Shortness of breath, heart palpitations, feels bad
all day. Bilateral cellulitis. History of CHF, coronary stents, and
pulmonary embolus.

EXAM:
CT ANGIOGRAPHY CHEST WITH CONTRAST
TECHNIQUE: Multidetector CT imaging of the chest was performed using the
standard protocol during bolus administration of intravenous
contrast. Multiplanar CT image reconstructions and MIPs were
obtained to evaluate the vascular anatomy.
CONTRAST:  100mL P0P8S4-HOH IOPAMIDOL (P0P8S4-HOH) INJECTION 76%

[Series 5: thins · axial · 0.91mm/px · z∈[+1196,+1460]mm · 15 of 290 slices shown]
[im 13/290  lung]
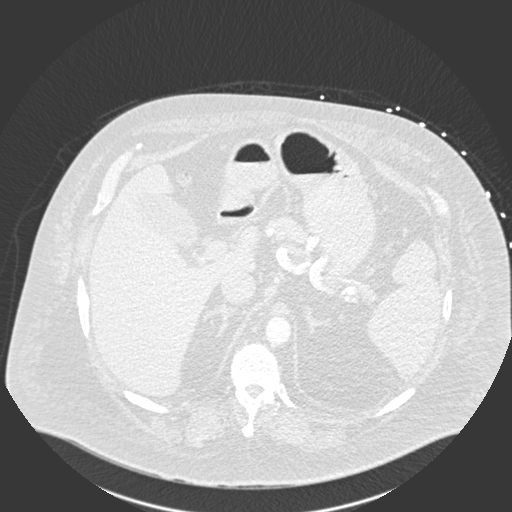
[im 38/290  soft-tissue]
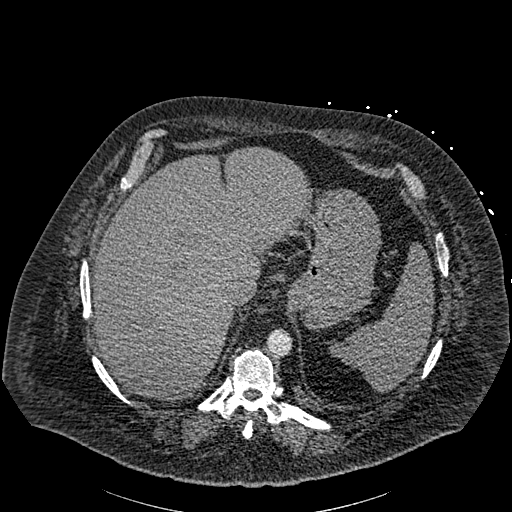
[im 51/290  lung]
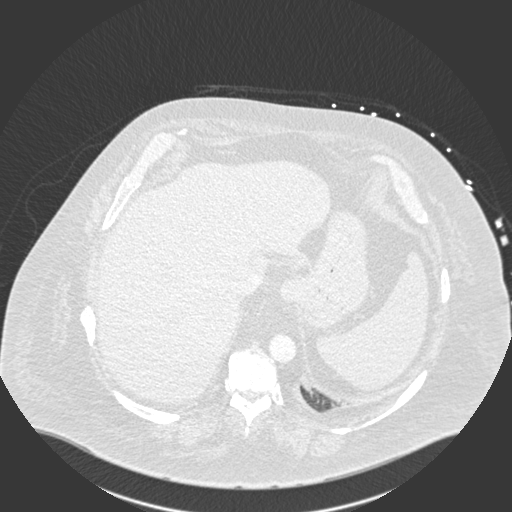
[im 76/290  soft-tissue]
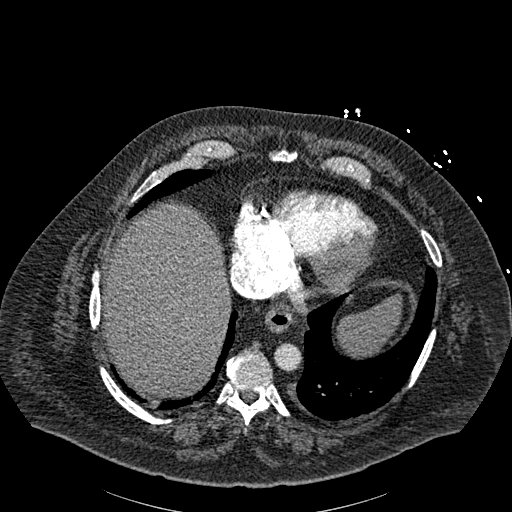
[im 88/290  lung]
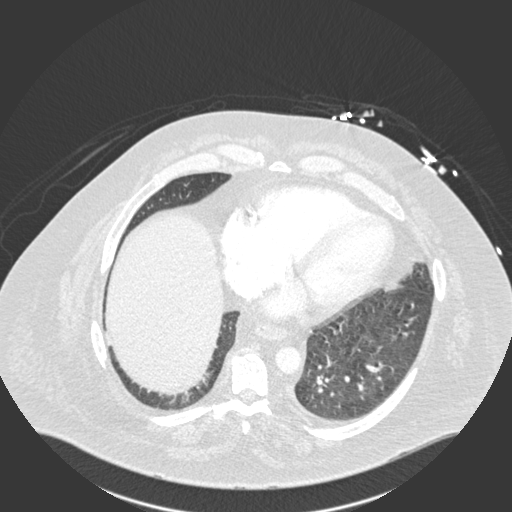
[im 114/290  soft-tissue]
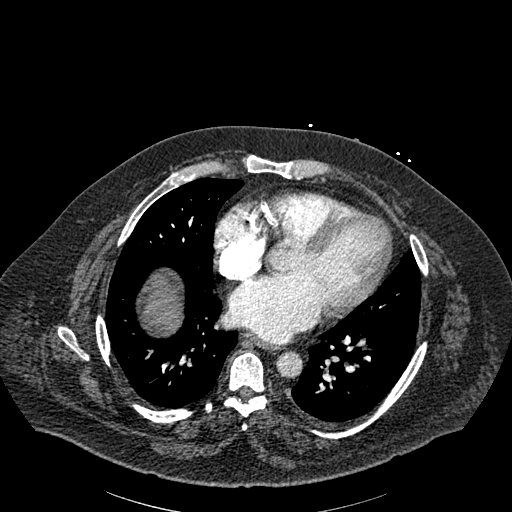
[im 126/290  lung]
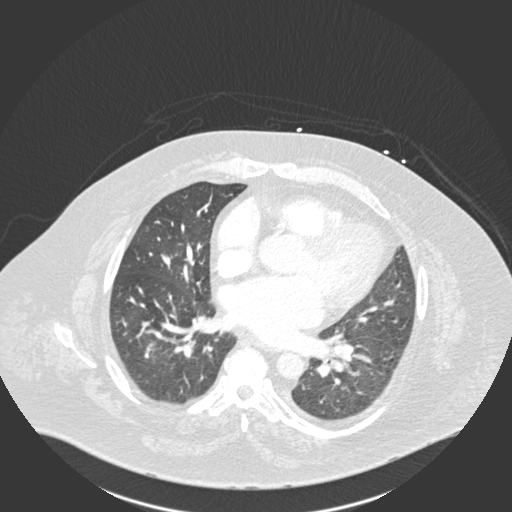
[im 151/290  soft-tissue]
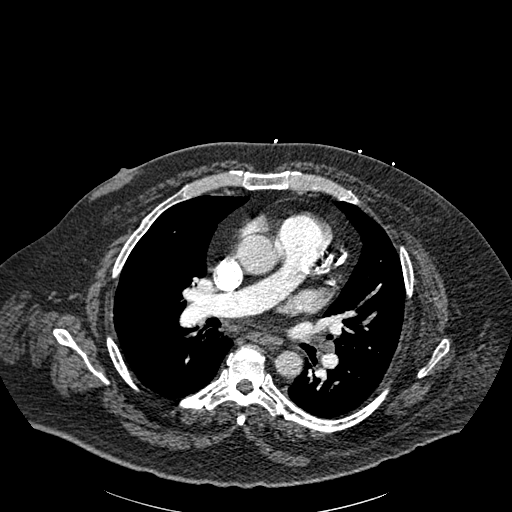
[im 164/290  lung]
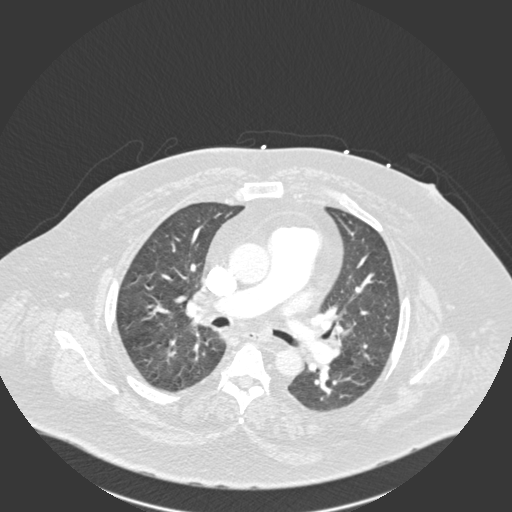
[im 176/290  soft-tissue]
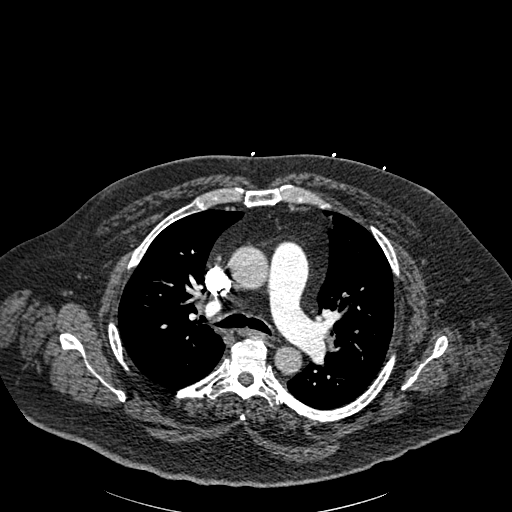
[im 202/290  lung]
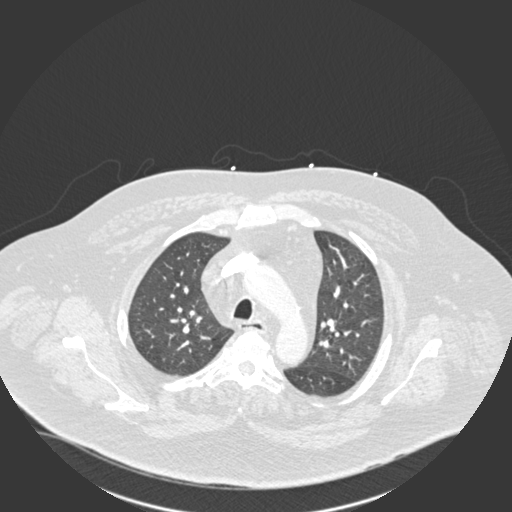
[im 214/290  soft-tissue]
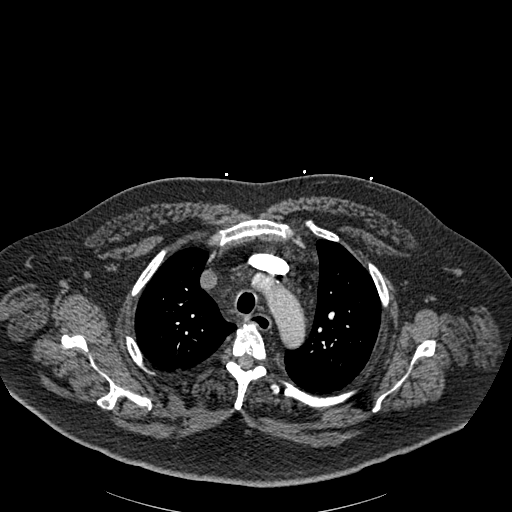
[im 239/290  lung]
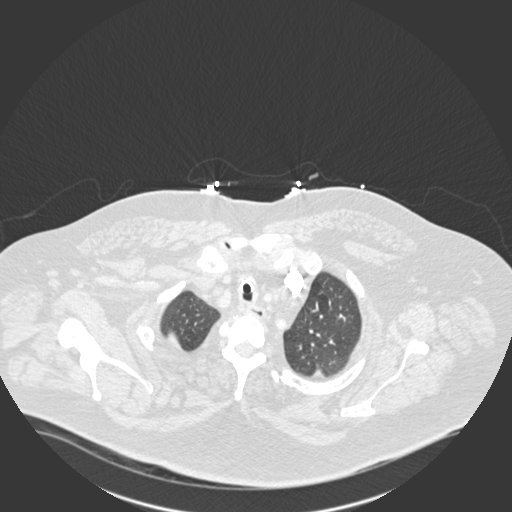
[im 252/290  soft-tissue]
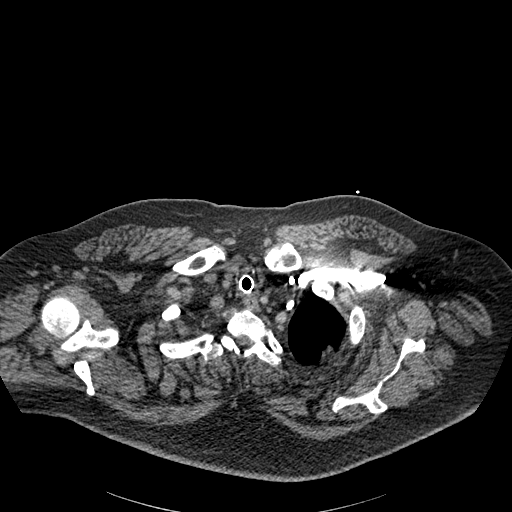
[im 277/290  lung]
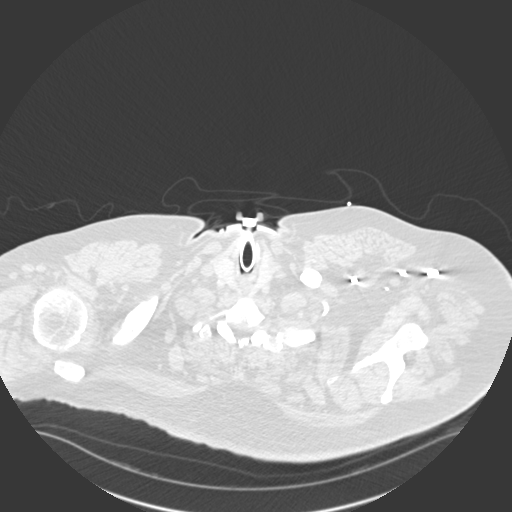

[Series 7: coronal mpr · coronal · 0.64mm/px · 3 of 95 slices shown]
[im 24/95  soft-tissue]
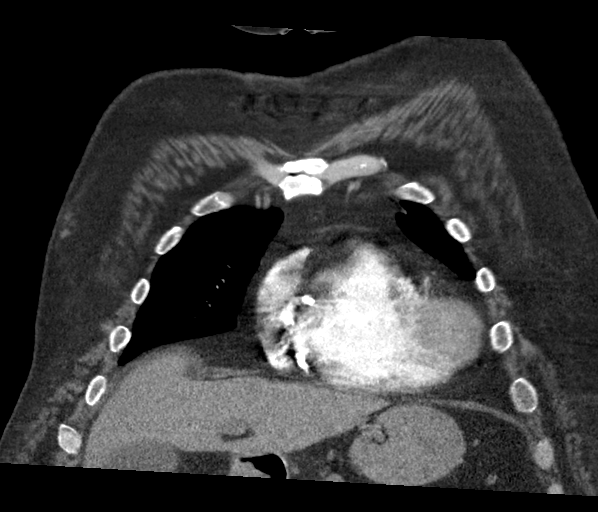
[im 48/95  soft-tissue]
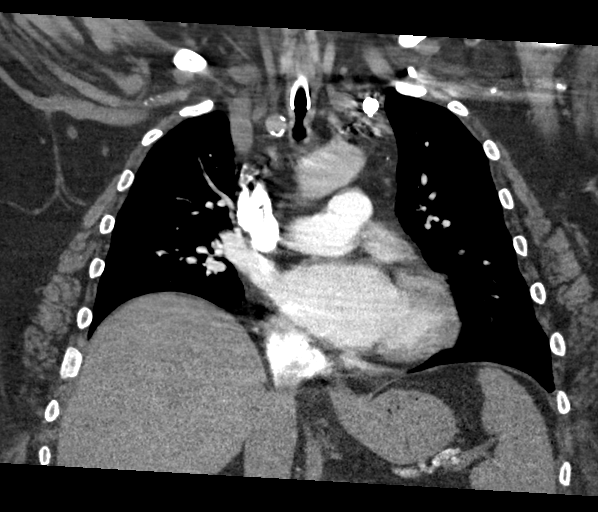
[im 71/95  soft-tissue]
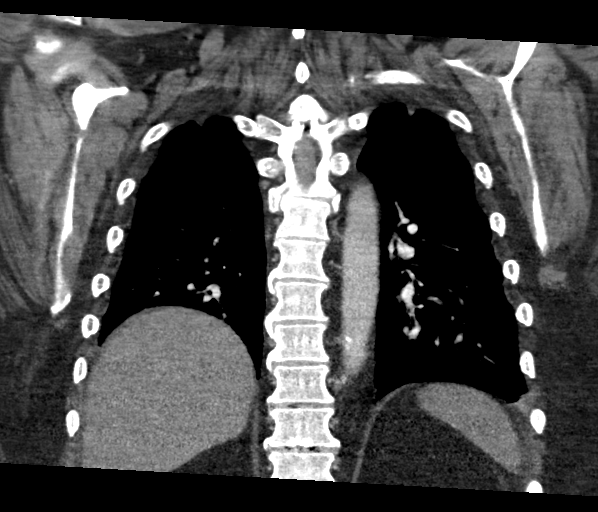

[18 of 46 positions shown; findings below may reference images not displayed]

FINDINGS: Cardiovascular: Good opacification of the central and segmental
pulmonary arteries. No focal filling defects. No evidence of
significant pulmonary embolus. Normal caliber thoracic aorta with
scattered calcifications. Mild cardiac enlargement with right heart
prominence. No pericardial effusion. Coronary artery calcifications
and stents.

Mediastinum/Nodes: No significant lymphadenopathy in the chest.
Esophagus is decompressed. A tracheostomy tube is present.

Lungs/Pleura: Evaluation is limited due to motion artifact. No
airspace disease or consolidation. No pleural effusions. No
pneumothorax. There is a 5 mm polypoid filling defect in the left
mainstem bronchus that appears to been present previously. While
this could represent mucus, the persistence of this lesion suggest a
possible endobronchial lesion. Consider bronchoscopy for further
evaluation. Prominent pleural fat.

Upper Abdomen: Gallbladder is in completely included within the
field of view but appears to demonstrate a thickened wall. This
could indicate cholecystitis in the appropriate clinical setting.
Liver disease or other etiologies could also have this appearance.
Vascular calcifications in the upper abdomen.

Musculoskeletal: No chest wall abnormality. No acute or significant
osseous findings.

Review of the MIP images confirms the above findings.
IMPRESSION: 1. No evidence of significant pulmonary embolus.
2. No evidence of active pulmonary disease.
3. Small endobronchial polypoid lesion in the left mainstem bronchus
appears to been present previously. Consider bronchoscopy to exclude
significant lesion.
4. Aortic atherosclerosis.  Coronary artery calcification.
5. Suggestion of gallbladder wall thickening. Changes could indicate
cholecystitis.

Aortic Atherosclerosis ([id number]).

## 2018-05-11 NOTE — Progress Notes (Signed)
Pulmonary Individual Treatment Plan  Patient Details  Name: [name] MRN: [id number] Date of Birth: 05-18-1961 Referring Provider:     Pulmonary Rehab from 03/17/2018 in [facility] Cardiac [facility] Pulmonary Rehab  Referring Provider  [name] MD      Initial Encounter Date:    Pulmonary Rehab from 03/17/2018 in [facility] Cardiac and Pulmonary Rehab  Date  03/17/18      Visit Diagnosis: Chronic respiratory failure, unspecified whether with hypoxia or hypercapnia ([place])  Patient's Home Medications on Admission:  Current Outpatient Medications:  .  acetaminophen (TYLENOL) 325 MG tablet, Take 1 tablet (325 mg total) by mouth every 6 (six) hours as needed for mild pain (or Fever >/= 101)., Disp: , Rfl:  .  ALPRAZolam (XANAX) 0.5 MG tablet, Take 1 tablet (0.5 mg total) by mouth every 8 (eight) hours as needed for anxiety., Disp: 20 tablet, Rfl: 0 .  AMBULATORY NON FORMULARY MEDICATION, Medication Name: Trachea collar for use with nebulizer DX: J96.10, Disp: 1 each, Rfl: 0 .  AMBULATORY NON FORMULARY MEDICATION, Medication Name: Nebulizer DX: J96.10, Disp: 1 each, Rfl: 0 .  amiodarone (PACERONE) 200 MG tablet, Take 200 mg by mouth 2 (two) times daily., Disp: , Rfl: 8 .  atorvastatin (LIPITOR) 40 MG tablet, Take 1 tablet (40 mg total) by mouth daily at 6 PM., Disp: 30 tablet, Rfl: 1 .  atorvastatin (LIPITOR) 80 MG tablet, Take 80 mg by mouth at bedtime., Disp: , Rfl:  .  budesonide (PULMICORT) 0.5 MG/2ML nebulizer solution, Take 2 mLs (0.5 mg total) by nebulization 2 (two) times daily., Disp: 75 mL, Rfl: 12 .  busPIRone (BUSPAR) 10 MG tablet, Take 10 mg by mouth 2 (two) times daily as needed. , Disp: , Rfl:  .  collagenase (SANTYL) ointment, Apply topically daily., Disp: 15 g, Rfl: 0 .  enalapril (VASOTEC) 2.5 MG tablet, Take 1 tablet (2.5 mg total) by mouth daily., Disp: 30 tablet, Rfl: 0 .  feeding supplement, GLUCERNA SHAKE, (GLUCERNA SHAKE) LIQD, Take 237 mLs by mouth 2 (two) times  daily between meals., Disp: 60 Can, Rfl: 0 .  folic acid (FOLVITE) 1 MG tablet, Take 1 tablet (1 mg total) by mouth daily., Disp: 30 tablet, Rfl: 0 .  furosemide (LASIX) 40 MG tablet, Take 1 tablet (40 mg total) by mouth daily., Disp: 30 tablet, Rfl: 0 .  gabapentin (NEURONTIN) 800 MG tablet, Take 1 tablet (800 mg total) by mouth 3 (three) times daily. 1 tablet twice daily (Patient taking differently: Take 1,600 mg by mouth 2 (two) times daily. ), Disp: 60 tablet, Rfl: 0 .  insulin detemir (LEVEMIR) 100 unit/ml SOLN, Inject 20 Units into the skin 2 (two) times daily. , Disp: , Rfl:  .  insulin lispro (HUMALOG) 100 UNIT/ML injection, Inject 2-10 Units into the skin 3 (three) times daily with meals. , Disp: , Rfl:  .  ipratropium-albuterol (DUONEB) 0.5-2.5 (3) MG/3ML SOLN, Take 3 mLs by nebulization 3 (three) times daily., Disp: 360 mL, Rfl: 1 .  lamoTRIgine (LAMICTAL) 150 MG tablet, Take 150 mg by mouth 2 (two) times daily. , Disp: , Rfl:  .  metFORMIN (GLUCOPHAGE-XR) 500 MG 24 hr tablet, Take 1,000 mg by mouth 2 (two) times daily. , Disp: , Rfl:  .  metoprolol tartrate 75 MG TABS, Take 75 mg by mouth 2 (two) times daily. 100 mg twice daily, Disp: 60 tablet, Rfl: 0 .  Multiple Vitamin (MULTIVITAMIN WITH MINERALS) TABS tablet, Take 1 tablet by mouth daily., Disp: ,  Rfl:  .  omeprazole (PRILOSEC) 20 MG capsule, Take 20 mg by mouth daily. Once daily, Disp: , Rfl:  .  oxyCODONE-acetaminophen (PERCOCET) 10-325 MG tablet, Take 1 tablet by mouth 4 (four) times daily as needed for pain. , Disp: , Rfl:  .  polyethylene glycol (MIRALAX / GLYCOLAX) packet, Take 17 g by mouth daily as needed for mild constipation., Disp: 14 each, Rfl: 0 .  rivaroxaban (XARELTO) 20 MG TABS tablet, Take 20 mg by mouth daily., Disp: , Rfl:  .  senna-docusate (SENOKOT-S) 8.6-50 MG tablet, Take 2 tablets by mouth at bedtime as needed for mild constipation., Disp: , Rfl:  .  thiamine 100 MG tablet, Take 1 tablet (100 mg total) by mouth  daily., Disp: 30 tablet, Rfl: 0  Past Medical History: Past Medical History:  Diagnosis Date  . CHF (congestive heart failure) ([place])   . Diabetes mellitus ([place])   . Diabetic peripheral neuropathy ([place])   . Elevated liver enzymes    fatty liver per [facility]  . GERD (gastroesophageal reflux disease)   . History of pulmonary embolus (PE)   . Hyperlipidemia   . Hypertension   . Myocardial infarct ([place])   . OSA (obstructive sleep apnea)   . 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 Renal insufficiency     Tobacco Use: Social History   Tobacco Use  Smoking Status Never Smoker  Smokeless Tobacco Never Used    Labs: Recent Review Flowsheet Data    Labs for ITP Cardiac and Pulmonary Rehab Latest Ref Rng & Units 07/17/2017 10/08/2017 10/24/2017   Cholestrol 0 - 200 mg/dL 125 - -   LDLCALC 0 - 99 mg/dL 53 - -   HDL >40 mg/dL 56 - -   Trlycerides <150 mg/dL 82 - -   Hemoglobin A1c 4.8 - 5.6 % 6.1(H) 5.7(H) -   HCO3 20.0 - 28.0 mmol/L - - 32.3(H)   O2SAT % - - 80.2       Pulmonary Assessment Scores: Pulmonary Assessment Scores    Row Name 03/17/18 1451         ADL UCSD   ADL Phase  Entry     SOB Score total  63     Rest  2     Walk  2     Stairs  4     Bath  1     Dress  1     Shop  3       CAT Score   CAT Score  25       mMRC Score   mMRC Score  2        Pulmonary Function Assessment: Pulmonary Function Assessment - 03/17/18 1519      Breath   Bilateral Breath Sounds  Clear    Shortness of Breath  Limiting activity;Yes;Panic with Shortness of Breath       Exercise Target Goals: Exercise Program Goal: Individual exercise prescription set using results from initial 6 min walk test and THRR while considering  patient's activity barriers and safety.   Exercise Prescription Goal: Initial exercise prescription builds to 30-45 minutes a day of aerobic activity, 2-3 days per week.  Home exercise guidelines will be given to  patient during program as part of exercise prescription that the participant will acknowledge.  Activity Barriers & Risk Stratification: Activity Barriers & Cardiac Risk Stratification - 03/17/18 1522      Activity Barriers & Cardiac Risk  Stratification   Activity Barriers  Muscular Weakness;Deconditioning;Balance Concerns;History of Falls;Assistive Device;Shortness of Breath;Back Problems;Decreased Ventricular Function   occasional back spasm      6 Minute Walk: 6 Minute Walk    Row Name 03/17/18 1520         6 Minute Walk   Phase  Initial     Distance  525 feet used rollator     Walk Time  6 minutes     # of Rest Breaks  0     MPH  0.99     METS  1.65     RPE  12     Perceived Dyspnea   1     VO2 Peak  5.78     Symptoms  Yes (comment)     Comments  arms got tired, legs got tight, slightly dizzy headed     Resting HR  61 bpm     Resting BP  124/72     Resting Oxygen Saturation   92 %     Exercise Oxygen Saturation  during 6 min walk  80 %     Max Ex. HR  100 bpm     Max Ex. BP  146/64     2 Minute Post BP  136/72       Interval HR   1 Minute HR  75     2 Minute HR  80     3 Minute HR  91     4 Minute HR  98     5 Minute HR  100     6 Minute HR  90     2 Minute Post HR  60     Interval Heart Rate?  Yes       Interval Oxygen   Interval Oxygen?  Yes     Baseline Oxygen Saturation %  92 %     1 Minute Oxygen Saturation %  83 %     1 Minute Liters of Oxygen  0 L Room Air     2 Minute Oxygen Saturation %  82 %     2 Minute Liters of Oxygen  0 L     3 Minute Oxygen Saturation %  82 %     3 Minute Liters of Oxygen  0 L     4 Minute Oxygen Saturation %  82 %     4 Minute Liters of Oxygen  0 L     5 Minute Oxygen Saturation %  80 %     5 Minute Liters of Oxygen  0 L     6 Minute Oxygen Saturation %  82 %     6 Minute Liters of Oxygen  0 L     2 Minute Post Oxygen Saturation %  85 %     2 Minute Post Liters of Oxygen  0 L       Oxygen Initial  Assessment: Oxygen Initial Assessment - 03/17/18 1518      Home Oxygen   Home Oxygen Device  Home Concentrator    Sleep Oxygen Prescription  Continuous    Liters per minute  3.5    Home Exercise Oxygen Prescription  None    Home at Rest Exercise Oxygen Prescription  None    Compliance with Home Oxygen Use  Yes      Initial 6 min Walk   Oxygen Used  None      Program Oxygen Prescription   Program Oxygen Prescription  None      Intervention   Short Term Goals  To learn and exhibit compliance with exercise, home and travel O2 prescription;To learn and understand importance of monitoring SPO2 with pulse oximeter and demonstrate accurate use of the pulse oximeter.;To learn and understand importance of maintaining oxygen saturations>88%;To learn and demonstrate proper pursed lip breathing techniques or other breathing techniques.;To learn and demonstrate proper use of respiratory medications    Long  Term Goals  Exhibits compliance with exercise, home and travel O2 prescription;Verbalizes importance of monitoring SPO2 with pulse oximeter and return demonstration;Maintenance of O2 saturations>88%;Exhibits proper breathing techniques, such as pursed lip breathing or other method taught during program session;Compliance with respiratory medication;Demonstrates proper use of MDI's       Oxygen Re-Evaluation:   Oxygen Discharge (Final Oxygen Re-Evaluation):   Initial Exercise Prescription: Initial Exercise Prescription - 03/17/18 1500      Date of Initial Exercise RX and Referring Provider   Date  03/17/18    Referring Provider  [name] MD      Oxygen   Oxygen  Continuous    Liters  2      Treadmill   MPH  0.8    Grade  0    Minutes  15    METs  1.6      Recumbant Elliptical   Level  1    RPM  50    Minutes  15    METs  1.6      T5 Nustep   Level  1    SPM  80    Minutes  15    METs  1.6      Prescription Details   Frequency (times per week)  3     Duration  Progress to 45 minutes of aerobic exercise without signs/symptoms of physical distress      Intensity   THRR 40-80% of Max Heartrate  102-144    Ratings of Perceived Exertion  11-13    Perceived Dyspnea  0-4      Progression   Progression  Continue to progress workloads to maintain intensity without signs/symptoms of physical distress.      Resistance Training   Training Prescription  Yes    Weight  4 lbs    Reps  10-15       Perform Capillary Blood Glucose checks as needed.  Exercise Prescription Changes: Exercise Prescription Changes    Row Name 03/17/18 1500 04/14/18 1500 04/28/18 1500         Response to Exercise   Blood Pressure (Admit)  124/72  130/62  128/62     Blood Pressure (Exercise)  146/64  138/76  140/62     Blood Pressure (Exit)  136/72  138/82  126/64     Heart Rate (Admit)  61 bpm  54 bpm  65 bpm     Heart Rate (Exercise)  100 bpm  78 bpm  89 bpm     Heart Rate (Exit)  60 bpm  53 bpm  62 bpm     Oxygen Saturation (Admit)  92 %  92 %  93 %     Oxygen Saturation (Exercise)  80 %  90 %  93 %     Oxygen Saturation (Exit)  85 %  93 %  98 %     Rating of Perceived Exertion (Exercise)  '12  15  15     '$ Perceived Dyspnea (Exercise)  1  3  3  Symptoms  SOB, arms and legs tired and tight, slightly dizzy  SOB  SOB     Comments  walk test results  first full day of exercise  second full day of exercise     Duration  -  Progress to 45 minutes of aerobic exercise without signs/symptoms of physical distress  Progress to 45 minutes of aerobic exercise without signs/symptoms of physical distress     Intensity  -  THRR unchanged  THRR unchanged       Progression   Progression  -  Continue to progress workloads to maintain intensity without signs/symptoms of physical distress.  Continue to progress workloads to maintain intensity without signs/symptoms of physical distress.     Average METs  -  2  1.55       Resistance Training   Training Prescription  -  Yes   Yes     Weight  -  4 lbs  4 lbs     Reps  -  10-15  10-15       Interval Training   Interval Training  -  No  No       Oxygen   Oxygen  -  -  -     Liters  -  -  -       Treadmill   MPH  -  0.8  0.8     Grade  -  0  0     Minutes  -  15  15     METs  -  1.6  1.6       Recumbant Elliptical   Level  -  1  -     Minutes  -  15  -     METs  -  1.5  -       T5 Nustep   Level  -  1  1     Minutes  -  15  15     METs  -  2.9  1.5        Exercise Comments: Exercise Comments    Row Name 04/13/18 1153           Exercise Comments  First full day of exercise!  Patient was oriented to gym and equipment including functions, settings, policies, and procedures.  Patient's individual exercise prescription and treatment plan were reviewed.  All starting workloads were established based on the results of the 6 minute walk test done at initial orientation visit.  The plan for exercise progression was also introduced and progression will be customized based on patient's performance and goals.          Exercise Goals and Review: Exercise Goals    Row Name 03/17/18 1526             Exercise Goals   Increase Physical Activity  Yes       Intervention  Provide advice, education, support and counseling about physical activity/exercise needs.;Develop an individualized exercise prescription for aerobic and resistive training based on initial evaluation findings, risk stratification, comorbidities and participant's personal goals.       Expected Outcomes  Short Term: Attend rehab on a regular basis to increase amount of physical activity.;Long Term: Add in home exercise to make exercise part of routine and to increase amount of physical activity.;Long Term: Exercising regularly at least 3-5 days a week.       Increase Strength and Stamina  Yes       Intervention  Provide advice, education, support and counseling about physical activity/exercise needs.;Develop an individualized exercise  prescription for aerobic and resistive training based on initial evaluation findings, risk stratification, comorbidities and participant's personal goals.       Expected Outcomes  Short Term: Increase workloads from initial exercise prescription for resistance, speed, and METs.;Short Term: Perform resistance training exercises routinely during rehab and add in resistance training at home;Long Term: Improve cardiorespiratory fitness, muscular endurance and strength as measured by increased METs and functional capacity (6MWT)       Able to understand and use rate of perceived exertion (RPE) scale  Yes       Intervention  Provide education and explanation on how to use RPE scale       Expected Outcomes  Short Term: Able to use RPE daily in rehab to express subjective intensity level;Long Term:  Able to use RPE to guide intensity level when exercising independently       Able to understand and use Dyspnea scale  Yes       Intervention  Provide education and explanation on how to use Dyspnea scale       Expected Outcomes  Short Term: Able to use Dyspnea scale daily in rehab to express subjective sense of shortness of breath during exertion;Long Term: Able to use Dyspnea scale to guide intensity level when exercising independently       Knowledge and understanding of Target Heart Rate Range (THRR)  Yes       Intervention  Provide education and explanation of THRR including how the numbers were predicted and where they are located for reference       Expected Outcomes  Short Term: Able to state/look up THRR;Short Term: Able to use daily as guideline for intensity in rehab;Long Term: Able to use THRR to govern intensity when exercising independently       Able to check pulse independently  Yes       Intervention  Provide education and demonstration on how to check pulse in carotid and radial arteries.;Review the importance of being able to check your own pulse for safety during independent exercise        Expected Outcomes  Short Term: Able to explain why pulse checking is important during independent exercise;Long Term: Able to check pulse independently and accurately       Understanding of Exercise Prescription  Yes       Intervention  Provide education, explanation, and written materials on patient's individual exercise prescription       Expected Outcomes  Short Term: Able to explain program exercise prescription;Long Term: Able to explain home exercise prescription to exercise independently          Exercise Goals Re-Evaluation : Exercise Goals Re-Evaluation    [place] Name 04/13/18 1153 04/28/18 1510           Exercise Goal Re-Evaluation   Exercise Goals Review  Increase Physical Activity;Able to understand and use rate of perceived exertion (RPE) scale;Understanding of Exercise Prescription;Increase Strength and Stamina;Able to understand and use Dyspnea scale  Increase Physical Activity;Increase Strength and Stamina;Understanding of Exercise Prescription      Comments  Reviewed RPE scale, THR and program prescription with pt today.  Pt voiced understanding and was given a copy of goals to take home.   Yahmir has only attended once since last review.  He has been out sick.  He has completed two full days of exercise.  He hopes to return soon.  Expected Outcomes  Short: Use RPE daily to regulate intensity. Long: Follow program prescription in THR.  Short: Attend class regularly.  Long: Continue to follow program prescription.          Discharge Exercise Prescription (Final Exercise Prescription Changes): Exercise Prescription Changes - 04/28/18 1500      Response to Exercise   Blood Pressure (Admit)  128/62    Blood Pressure (Exercise)  140/62    Blood Pressure (Exit)  126/64    Heart Rate (Admit)  65 bpm    Heart Rate (Exercise)  89 bpm    Heart Rate (Exit)  62 bpm    Oxygen Saturation (Admit)  93 %    Oxygen Saturation (Exercise)  93 %    Oxygen Saturation (Exit)  98 %     Rating of Perceived Exertion (Exercise)  15    Perceived Dyspnea (Exercise)  3    Symptoms  SOB    Comments  second full day of exercise    Duration  Progress to 45 minutes of aerobic exercise without signs/symptoms of physical distress    Intensity  THRR unchanged      Progression   Progression  Continue to progress workloads to maintain intensity without signs/symptoms of physical distress.    Average METs  1.55      Resistance Training   Training Prescription  Yes    Weight  4 lbs    Reps  10-15      Interval Training   Interval Training  No      Treadmill   MPH  0.8    Grade  0    Minutes  15    METs  1.6      T5 Nustep   Level  1    Minutes  15    METs  1.5       Nutrition:  Target Goals: Understanding of nutrition guidelines, daily intake of sodium '1500mg'$ , cholesterol '200mg'$ , calories 30% from fat and 7% or less from saturated fats, daily to have 5 or more servings of fruits and vegetables.  Biometrics: Pre Biometrics - 03/17/18 1527      Pre Biometrics   Height  6' 1.5" (1.867 m)    Weight  (!) 313 lb 3.2 oz (142.1 kg)    Waist Circumference  53 inches    Hip Circumference  50 inches    Waist to Hip Ratio  1.06 %    BMI (Calculated)  40.76    Single Leg Stand  0 seconds        Nutrition Therapy Plan and Nutrition Goals: Nutrition Therapy & Goals - 04/13/18 1358      Nutrition Therapy   Diet  DM    Drug/Food Interactions  Statins/Certain Fruits    Protein (specify units)  12-13oz    Fiber  35 grams    Whole Grain Foods  3 servings   eats white-wheat or rye bread   Saturated Fats  16 max. grams    Fruits and Vegetables  6 servings/day   8 ideal; eats more fruits than vegetables   Sodium  2000 grams   does not need to pt reports doctor to tell pt not to worry about monitoring his sodium intake currently     Personal Nutrition Goals   Nutrition Goal  Keep intake of non-nutritious liquid calories IE beer to a minimum. Try to reduce daily  consumption from 3-4/day to 1-2/day as an initial goal    Personal Goal #  2  Choose evening snacks that are low sugar or zero sugar as opposed to pies and cakes. For example, sugar free pudding or fruit with yogurt    Comments  He has made several changes to his eating practices which has resulted in wt loss of 64# x2 years. BG control has improved, most recent 3 month average 130; HgbA1c 6.7. He has started to eat "lighter" meals and no longer drinks sodas. He has reduced frequency of eating food out of the home from several times per week to zero in the past several months. Drinks water and unsweetened iced tea for beverages, but also typically drinks 3-4 beers per day. Breakfast: oatmeal with fruit and honey, Lunch: apple and cheese, Dinner: soup & sandwich, occasional frozen pizza, beef stew. Typically has a sweet evening snack such as pie but has also tried some sugar free dessert options. Does not eat many vegetables at this time and does not feel that he eats a lot of meat      Intervention Plan   Intervention  Prescribe, educate and counsel regarding individualized specific dietary modifications aiming towards targeted core components such as weight, hypertension, lipid management, diabetes, heart failure and other comorbidities.    Expected Outcomes  Short Term Goal: Understand basic principles of dietary content, such as calories, fat, sodium, cholesterol and nutrients.;Short Term Goal: A plan has been developed with personal nutrition goals set during dietitian appointment.;Long Term Goal: Adherence to prescribed nutrition plan.       Nutrition Assessments:   Nutrition Goals Re-Evaluation: Nutrition Goals Re-Evaluation    [place] Name 04/13/18 1424             Goals   Nutrition Goal  Keep intake of non-nutritious liquid calories IE beer to a minimum. Try to reduce daily consumption from 3-4/day to 1-2/day as an initial goal       Comment  He has removed all other liquid calories such as  soda and sweet tea from his diet but continues to drink alcohol daily. He desires to lose another 10-20#       Expected Outcome  He will reduce daily alcohol consumption until he is having an alcoholic beverage only sparingly rather than multiple each day         Personal Goal #2 Re-Evaluation   Personal Goal #2  Choose evening snacks that are low sugar or zero sugar as opposed to pies and cakes. For example, sugar free pudding or fruit with yogurt          Nutrition Goals Discharge (Final Nutrition Goals Re-Evaluation): Nutrition Goals Re-Evaluation - 04/13/18 1424      Goals   Nutrition Goal  Keep intake of non-nutritious liquid calories IE beer to a minimum. Try to reduce daily consumption from 3-4/day to 1-2/day as an initial goal    Comment  He has removed all other liquid calories such as soda and sweet tea from his diet but continues to drink alcohol daily. He desires to lose another 10-20#    Expected Outcome  He will reduce daily alcohol consumption until he is having an alcoholic beverage only sparingly rather than multiple each day      Personal Goal #2 Re-Evaluation   Personal Goal #2  Choose evening snacks that are low sugar or zero sugar as opposed to pies and cakes. For example, sugar free pudding or fruit with yogurt       Psychosocial: Target Goals: Acknowledge presence or absence of significant depression and/or stress,  maximize coping skills, provide positive support system. Participant is able to verbalize types and ability to use techniques and skills needed for reducing stress and depression.   Initial Review & Psychosocial Screening: Initial Psych Review & Screening - 03/17/18 1520      Initial Review   Current issues with  Current Depression;History of Depression;Current Stress Concerns    Source of Stress Concerns  Chronic Illness;Unable to perform yard/household activities;Financial    Comments  Financial stress and relationship stress. He is unable to do  things that he used to do.      Family Dynamics   Good Support System?  Yes    Comments  He can look to his brothers and his fiance. His mother is also supportive for him.      Barriers   Psychosocial barriers to participate in program  The patient should benefit from training in stress management and relaxation.      Screening Interventions   Interventions  Encouraged to exercise;To provide support and resources with identified psychosocial needs;Provide feedback about the scores to participant;Program counselor consult    Expected Outcomes  Short Term goal: Utilizing psychosocial counselor, staff and physician to assist with identification of specific Stressors or current issues interfering with healing process. Setting desired goal for each stressor or current issue identified.;Long Term Goal: Stressors or current issues are controlled or eliminated.;Short Term goal: Identification and review with participant of any Quality of Life or Depression concerns found by scoring the questionnaire.;Long Term goal: The participant improves quality of Life and PHQ9 Scores as seen by post scores and/or verbalization of changes       Quality of Life Scores:  Scores of 19 and below usually indicate a poorer quality of life in these areas.  A difference of  2-3 points is a clinically meaningful difference.  A difference of 2-3 points in the total score of the Quality of Life Index has been associated with significant improvement in overall quality of life, self-image, physical symptoms, and general health in studies assessing change in quality of life.  PHQ-9: Recent Review Flowsheet Data    Depression screen [facility] 2/9 03/17/2018   Decreased Interest 3   Down, Depressed, Hopeless 2   PHQ - 2 Score 5   Altered sleeping 2   Tired, decreased energy 2   Change in appetite 1   Feeling bad or failure about yourself  2   Trouble concentrating 1   Moving slowly or fidgety/restless 1   Suicidal thoughts 0    PHQ-9 Score 14   Difficult doing work/chores Very difficult     Interpretation of Total Score  Total Score Depression Severity:  1-4 = Minimal depression, 5-9 = Mild depression, 10-14 = Moderate depression, 15-19 = Moderately severe depression, 20-27 = Severe depression   Psychosocial Evaluation and Intervention: Psychosocial Evaluation - 04/13/18 1215      Psychosocial Evaluation & Interventions   Interventions  Stress management education;Relaxation education;Encouraged to exercise with the program and follow exercise prescription;Therapist referral    Comments  Counselor met with Mr. Murley Bless) today for initial psychsoocial evaluation.  He is a 57 year old who has multiple health issues including a recent polyp removed from his airway.  Malcolm has had a trache for the past 9 years and also struggles with diabetes; neuropathy; GERD; CHF; obesity.  He sleeps well with a ventillator although he has OSA. Daivion has a good support system with a partner of 14 years; and a mother who  lives locally and brothers who stay in touch by phone.  He has a good appetite.  Saketh reports a history of depression and anxiety and is on medications for these currently.  He states he is currently depressed and would like to see a counselor - if possible.  Counselor reviewed the PHQ-9 with Haskel Schroeder score of "14" - which indicated moderate symptoms for depression.  Additionally he has multiple stressors with his health and finances.  He would like to improve his breathing while in this program.  Counselor provided contact information to Gerardo for local therapist options to see if they are taking new patients.  Counselor will follow with him about this.     Expected Outcomes  Short:  Derrich will contact a therapist to begin treatment for depressive symptoms.  Lue will exercise consistently in this program for his health and mental health - particularly positive coping strategies.  Long:  Kareem will develop a healthy lifestyle routine  of exercise, diet and positive coping strategies for his health and mental health.     Continue Psychosocial Services   Follow up required by counselor       Psychosocial Re-Evaluation:   Psychosocial Discharge (Final Psychosocial Re-Evaluation):   Education: Education Goals: Education classes will be provided on a weekly basis, covering required topics. Participant will state understanding/return demonstration of topics presented.  Learning Barriers/Preferences: Learning Barriers/Preferences - 03/17/18 1525      Learning Barriers/Preferences   Learning Barriers  None    Learning Preferences  None       Education Topics:  Initial Evaluation Education: - Verbal, written and demonstration of respiratory meds, oximetry and breathing techniques. Instruction on use of nebulizers and MDIs and importance of monitoring MDI activations.   Pulmonary Rehab from 04/15/2018 in [facility] Cardiac and Pulmonary Rehab  Date  03/17/18  Educator  [facility]  Instruction Review Code  1- Verbalizes Understanding      General Nutrition Guidelines/Fats and Fiber: -Group instruction provided by verbal, written material, models and posters to present the general guidelines for heart healthy nutrition. Gives an explanation and review of dietary fats and fiber.   Controlling Sodium/Reading Food Labels: -Group verbal and written material supporting the discussion of sodium use in heart healthy nutrition. Review and explanation with models, verbal and written materials for utilization of the food label.   Exercise Physiology & General Exercise Guidelines: - Group verbal and written instruction with models to review the exercise physiology of the cardiovascular system and associated critical values. Provides general exercise guidelines with specific guidelines to those with heart or lung disease.    Aerobic Exercise & Resistance Training: - Gives group verbal and written instruction on the various components of  exercise. Focuses on aerobic and resistive training programs and the benefits of this training and how to safely progress through these programs.   Flexibility, Balance, Mind/Body Relaxation: Provides group verbal/written instruction on the benefits of flexibility and balance training, including mind/body exercise modes such as yoga, pilates and tai chi.  Demonstration and skill practice provided.   Pulmonary Rehab from 04/15/2018 in [facility] Cardiac and Pulmonary Rehab  Date  04/15/18  Educator  [facility]  Instruction Review Code  1- Verbalizes Understanding      Stress and Anxiety: - Provides group verbal and written instruction about the health risks of elevated stress and causes of high stress.  Discuss the correlation between heart/lung disease and anxiety and treatment options. Review healthy ways to manage with stress and anxiety.  Depression: - Provides group verbal and written instruction on the correlation between heart/lung disease and depressed mood, treatment options, and the stigmas associated with seeking treatment.   Exercise & Equipment Safety: - Individual verbal instruction and demonstration of equipment use and safety with use of the equipment.   Pulmonary Rehab from 04/15/2018 in [facility] Cardiac and Pulmonary Rehab  Date  03/17/18  Educator  [facility]  Instruction Review Code  1- Verbalizes Understanding      Infection Prevention: - Provides verbal and written material to individual with discussion of infection control including proper hand washing and proper equipment cleaning during exercise session.   Pulmonary Rehab from 04/15/2018 in [facility] Cardiac and Pulmonary Rehab  Date  03/17/18  Educator  [facility]  Instruction Review Code  1- Verbalizes Understanding      Falls Prevention: - Provides verbal and written material to individual with discussion of falls prevention and safety.   Pulmonary Rehab from 04/15/2018 in [facility] Cardiac and Pulmonary Rehab  Date  03/17/18  Educator  [facility]   Instruction Review Code  1- Verbalizes Understanding      Diabetes: - Individual verbal and written instruction to review signs/symptoms of diabetes, desired ranges of glucose level fasting, after meals and with exercise. Advice that pre and post exercise glucose checks will be done for 3 sessions at entry of program.   Chronic Lung Diseases: - Group verbal and written instruction to review updates, respiratory medications, advancements in procedures and treatments. Discuss use of supplemental oxygen including available portable oxygen systems, continuous and intermittent flow rates, concentrators, personal use and safety guidelines. Review proper use of inhaler and spacers. Provide informative websites for self-education.    Energy Conservation: - Provide group verbal and written instruction for methods to conserve energy, plan and organize activities. Instruct on pacing techniques, use of adaptive equipment and posture/positioning to relieve shortness of breath.   Triggers and Exacerbations: - Group verbal and written instruction to review types of environmental triggers and ways to prevent exacerbations. Discuss weather changes, air quality and the benefits of nasal washing. Review warning signs and symptoms to help prevent infections. Discuss techniques for effective airway clearance, coughing, and vibrations.   AED/CPR: - Group verbal and written instruction with the use of models to demonstrate the basic use of the AED with the basic ABC's of resuscitation.   Anatomy and Physiology of the Lungs: - Group verbal and written instruction with the use of models to provide basic lung anatomy and physiology related to function, structure and complications of lung disease.   Anatomy & Physiology of the Heart: - Group verbal and written instruction and models provide basic cardiac anatomy and physiology, with the coronary electrical and arterial systems. Review of Valvular disease and  Heart Failure   Cardiac Medications: - Group verbal and written instruction to review commonly prescribed medications for heart disease. Reviews the medication, class of the drug, and side effects.   Know Your Numbers and Risk Factors: -Group verbal and written instruction about important numbers in your health.  Discussion of what are risk factors and how they play a role in the disease process.  Review of Cholesterol, Blood Pressure, Diabetes, and BMI and the role they play in your overall health.   Sleep Hygiene: -Provides group verbal and written instruction about how sleep can affect your health.  Define sleep hygiene, discuss sleep cycles and impact of sleep habits. Review good sleep hygiene tips.    Other: -Provides group and verbal instruction on various  topics (see comments)    Knowledge Questionnaire Score: Knowledge Questionnaire Score - 03/17/18 1452      Knowledge Questionnaire Score   Pre Score  16/18   REVIEWED WITH patient       Core Components/Risk Factors/Patient Goals at Admission: Personal Goals and Risk Factors at Admission - 03/17/18 1525      Core Components/Risk Factors/Patient Goals on Admission    Weight Management  Weight Loss    Intervention  Weight Management: Develop a combined nutrition and exercise program designed to reach desired caloric intake, while maintaining appropriate intake of nutrient and fiber, sodium and fats, and appropriate energy expenditure required for the weight goal.;Weight Management/Obesity: Establish reasonable short term and long term weight goals.;Weight Management: Provide education and appropriate resources to help participant work on and attain dietary goals.    Admit Weight  313 lb 3.2 oz (142.1 kg)    Goal Weight: Short Term  308 lb (139.7 kg)    Goal Weight: Long Term  290 lb (131.5 kg)    Expected Outcomes  Short Term: Continue to assess and modify interventions until short term weight is achieved;Long Term:  Adherence to nutrition and physical activity/exercise program aimed toward attainment of established weight goal;Understanding recommendations for meals to include 15-35% energy as protein, 25-35% energy from fat, 35-60% energy from carbohydrates, less than '200mg'$  of dietary cholesterol, 20-35 gm of total fiber daily;Understanding of distribution of calorie intake throughout the day with the consumption of 4-5 meals/snacks;Weight Loss: Understanding of general recommendations for a balanced deficit meal plan, which promotes 1-2 lb weight loss per week and includes a negative energy balance of [phone] kcal/d    Improve shortness of breath with ADL's  Yes    Intervention  Provide education, individualized exercise plan and daily activity instruction to help decrease symptoms of SOB with activities of daily living.    Expected Outcomes  Short Term: Improve cardiorespiratory fitness to achieve a reduction of symptoms when performing ADLs;Long Term: Be able to perform more ADLs without symptoms or delay the onset of symptoms    Diabetes  Yes    Intervention  Provide education about signs/symptoms and action to take for hypo/hyperglycemia.;Provide education about proper nutrition, including hydration, and aerobic/resistive exercise prescription along with prescribed medications to achieve blood glucose in normal ranges: Fasting glucose 65-99 mg/dL    Expected Outcomes  Short Term: Participant verbalizes understanding of the signs/symptoms and immediate care of hyper/hypoglycemia, proper foot care and importance of medication, aerobic/resistive exercise and nutrition plan for blood glucose control.;Long Term: Attainment of HbA1C < 7%.   his A1c was 6.5 he states   Heart Failure  Yes   pace maker   Intervention  Provide a combined exercise and nutrition program that is supplemented with education, support and counseling about heart failure. Directed toward relieving symptoms such as shortness of breath, decreased  exercise tolerance, and extremity edema.    Expected Outcomes  Improve functional capacity of life;Short term: Attendance in program 2-3 days a week with increased exercise capacity. Reported lower sodium intake. Reported increased fruit and vegetable intake. Reports medication compliance.;Short term: Daily weights obtained and reported for increase. Utilizing diuretic protocols set by physician.;Long term: Adoption of self-care skills and reduction of barriers for early signs and symptoms recognition and intervention leading to self-care maintenance.    Hypertension  Yes    Intervention  Provide education on lifestyle modifcations including regular physical activity/exercise, weight management, moderate sodium restriction and increased consumption of fresh fruit, vegetables,  and low fat dairy, alcohol moderation, and smoking cessation.;Monitor prescription use compliance.    Expected Outcomes  Short Term: Continued assessment and intervention until BP is < 140/60m HG in hypertensive participants. < 130/834mHG in hypertensive participants with diabetes, heart failure or chronic kidney disease.;Long Term: Maintenance of blood pressure at goal levels.    Lipids  Yes    Intervention  Provide education and support for participant on nutrition & aerobic/resistive exercise along with prescribed medications to achieve LDL '70mg'$ , HDL >'40mg'$ .    Expected Outcomes  Short Term: Participant states understanding of desired cholesterol values and is compliant with medications prescribed. Participant is following exercise prescription and nutrition guidelines.;Long Term: Cholesterol controlled with medications as prescribed, with individualized exercise RX and with personalized nutrition plan. Value goals: LDL < '70mg'$ , HDL > 40 mg.       Core Components/Risk Factors/Patient Goals Review:    Core Components/Risk Factors/Patient Goals at Discharge (Final Review):    ITP Comments: ITP Comments    Row Name 03/17/18  1446 04/13/18 0855 04/28/18 1509 05/11/18 0841     ITP Comments  Medical Evaluation completed. Chart sent for review and changes to Dr. MaEmily Filbertirector of LuGratiotDiagnosis can be found in [facility] encounter 02/27/18  30 day review completed. ITP sent to Dr. MaEmily Filbertirector of LuChoctawContinue with ITP unless changes are made by physician.  Called to check on pt.  He has been out since 04/15/18.  He said that last week he was out sick and that on Monday he couldn't get a ride scheduled.  He has a ride already set up for tomorrow and hopes to be here then.    30 day review completed. ITP sent to Dr. MaEmily Filbertirector of LuBensonContinue with ITP unless changes are made by physician.       Comments: 30 day review

## 2018-05-11 NOTE — Telephone Encounter (Signed)
Left message for patient to call back since he has not been in rehab.

## 2018-05-13 ENCOUNTER — Encounter: Payer: Medicare HMO | Admitting: *Deleted

## 2018-05-13 DIAGNOSIS — N289 Disorder of kidney and ureter, unspecified: Secondary | ICD-10-CM | POA: Diagnosis not present

## 2018-05-13 DIAGNOSIS — E1142 Type 2 diabetes mellitus with diabetic polyneuropathy: Secondary | ICD-10-CM | POA: Diagnosis not present

## 2018-05-13 DIAGNOSIS — J9610 Chronic respiratory failure, unspecified whether with hypoxia or hypercapnia: Secondary | ICD-10-CM | POA: Diagnosis present

## 2018-05-13 DIAGNOSIS — I509 Heart failure, unspecified: Secondary | ICD-10-CM | POA: Diagnosis not present

## 2018-05-13 DIAGNOSIS — Z7901 Long term (current) use of anticoagulants: Secondary | ICD-10-CM | POA: Diagnosis not present

## 2018-05-13 DIAGNOSIS — I110 Hypertensive heart disease with heart failure: Secondary | ICD-10-CM | POA: Diagnosis not present

## 2018-05-13 DIAGNOSIS — I252 Old myocardial infarction: Secondary | ICD-10-CM | POA: Diagnosis not present

## 2018-05-13 DIAGNOSIS — Z79899 Other long term (current) drug therapy: Secondary | ICD-10-CM | POA: Diagnosis not present

## 2018-05-13 DIAGNOSIS — Z794 Long term (current) use of insulin: Secondary | ICD-10-CM | POA: Diagnosis not present

## 2018-05-13 DIAGNOSIS — Z7951 Long term (current) use of inhaled steroids: Secondary | ICD-10-CM | POA: Diagnosis not present

## 2018-05-13 DIAGNOSIS — Z86711 Personal history of pulmonary embolism: Secondary | ICD-10-CM | POA: Diagnosis not present

## 2018-05-13 DIAGNOSIS — G4733 Obstructive sleep apnea (adult) (pediatric): Secondary | ICD-10-CM | POA: Diagnosis not present

## 2018-05-13 DIAGNOSIS — E785 Hyperlipidemia, unspecified: Secondary | ICD-10-CM | POA: Diagnosis not present

## 2018-05-13 DIAGNOSIS — K219 Gastro-esophageal reflux disease without esophagitis: Secondary | ICD-10-CM | POA: Diagnosis not present

## 2018-05-13 LAB — GLUCOSE, CAPILLARY
GLUCOSE-CAPILLARY: 151 mg/dL — AB (ref 70–99)
GLUCOSE-CAPILLARY: 167 mg/dL — AB (ref 70–99)

## 2018-05-13 NOTE — Progress Notes (Signed)
Daily Session Note  Patient Details  Name: [name] MRN: [id number] Date of Birth: May 27, 1961 Referring Provider:     Pulmonary Rehab from 03/17/2018 in [facility] Cardiac and Pulmonary Rehab  Referring Provider  [name] MD      Encounter Date: 05/13/2018  Check In: Session Check In - 05/13/18 1122      Check-In   Supervising physician immediately available to respond to emergencies  [facility] immediately available ER MD    Physician(s)  Drs. Malinda and [facility]    Location  [facility]-Cardiac & Pulmonary Rehab    Staff Present  Alberteen Sam, MA, RCEP, CCRP, Exercise Physiologist;Joseph Alcus Dad, RN BSN    Medication changes reported      No    Fall or balance concerns reported     No    Warm-up and Cool-down  Performed as group-led Higher education careers adviser Performed  Yes    VAD Patient?  No    PAD/SET Patient?  No          Social History   Tobacco Use  Smoking Status Never Smoker  Smokeless Tobacco Never Used    Goals Met:  Proper associated with RPD/PD & O2 Sat Independence with exercise equipment Using PLB without cueing & demonstrates good technique Exercise tolerated well No report of cardiac concerns or symptoms Strength training completed today  Goals Unmet:  Not Applicable  Comments: Pt able to follow exercise prescription today without complaint.  Will continue to monitor for progression.    Dr. Emily Filbert is Medical Director for [place] and [facility].

## 2018-05-20 DIAGNOSIS — J9610 Chronic respiratory failure, unspecified whether with hypoxia or hypercapnia: Secondary | ICD-10-CM

## 2018-05-20 NOTE — Progress Notes (Signed)
Daily Session Note  Patient Details  Name: [name] MRN: [id number] Date of Birth: 27-Nov-1960 Referring Provider:     Pulmonary Rehab from 03/17/2018 in [facility] Cardiac and Pulmonary Rehab  Referring Provider  [name] MD      Encounter Date: 05/20/2018  Check In: Session Check In - 05/20/18 1141      Check-In   Supervising physician immediately available to respond to emergencies  [facility] immediately available ER MD    Physician(s)  Dr. Quentin Cornwall and Darl Householder    Location  [facility]-Cardiac & Pulmonary Rehab    Staff Present  Carson Myrtle, BS, RRT, Respiratory Therapist;Meredith Sherryll Burger, RN BSN;Beacher Every RCP,RRT,BSRT    Medication changes reported      No    Fall or balance concerns reported     No    Warm-up and Cool-down  Performed as group-led instruction    Resistance Training Performed  Yes    VAD Patient?  No    PAD/SET Patient?  No      Pain Assessment   Currently in Pain?  No/denies          Social History   Tobacco Use  Smoking Status Never Smoker  Smokeless Tobacco Never Used    Goals Met:  Independence with exercise equipment Exercise tolerated well No report of cardiac concerns or symptoms Strength training completed today  Goals Unmet:  Not Applicable  Comments: Pt able to follow exercise prescription today without complaint.  Will continue to monitor for progression.    Dr. Emily Filbert is Medical Director for [place] and [facility].

## 2018-05-25 ENCOUNTER — Encounter: Payer: Medicare HMO | Attending: Internal Medicine

## 2018-05-25 DIAGNOSIS — N289 Disorder of kidney and ureter, unspecified: Secondary | ICD-10-CM | POA: Insufficient documentation

## 2018-05-25 DIAGNOSIS — Z7951 Long term (current) use of inhaled steroids: Secondary | ICD-10-CM | POA: Insufficient documentation

## 2018-05-25 DIAGNOSIS — I252 Old myocardial infarction: Secondary | ICD-10-CM | POA: Insufficient documentation

## 2018-05-25 DIAGNOSIS — Z794 Long term (current) use of insulin: Secondary | ICD-10-CM | POA: Insufficient documentation

## 2018-05-25 DIAGNOSIS — E785 Hyperlipidemia, unspecified: Secondary | ICD-10-CM | POA: Insufficient documentation

## 2018-05-25 DIAGNOSIS — G4733 Obstructive sleep apnea (adult) (pediatric): Secondary | ICD-10-CM | POA: Insufficient documentation

## 2018-05-25 DIAGNOSIS — J9610 Chronic respiratory failure, unspecified whether with hypoxia or hypercapnia: Secondary | ICD-10-CM | POA: Insufficient documentation

## 2018-05-25 DIAGNOSIS — I110 Hypertensive heart disease with heart failure: Secondary | ICD-10-CM | POA: Insufficient documentation

## 2018-05-25 DIAGNOSIS — Z7901 Long term (current) use of anticoagulants: Secondary | ICD-10-CM | POA: Insufficient documentation

## 2018-05-25 DIAGNOSIS — K219 Gastro-esophageal reflux disease without esophagitis: Secondary | ICD-10-CM | POA: Insufficient documentation

## 2018-05-25 DIAGNOSIS — Z86711 Personal history of pulmonary embolism: Secondary | ICD-10-CM | POA: Insufficient documentation

## 2018-05-25 DIAGNOSIS — E1142 Type 2 diabetes mellitus with diabetic polyneuropathy: Secondary | ICD-10-CM | POA: Insufficient documentation

## 2018-05-25 DIAGNOSIS — Z79899 Other long term (current) drug therapy: Secondary | ICD-10-CM | POA: Insufficient documentation

## 2018-05-25 DIAGNOSIS — I509 Heart failure, unspecified: Secondary | ICD-10-CM | POA: Insufficient documentation

## 2018-06-03 ENCOUNTER — Encounter: Payer: Medicare HMO | Admitting: *Deleted

## 2018-06-03 DIAGNOSIS — I252 Old myocardial infarction: Secondary | ICD-10-CM | POA: Diagnosis not present

## 2018-06-03 DIAGNOSIS — I509 Heart failure, unspecified: Secondary | ICD-10-CM | POA: Diagnosis not present

## 2018-06-03 DIAGNOSIS — J9610 Chronic respiratory failure, unspecified whether with hypoxia or hypercapnia: Secondary | ICD-10-CM

## 2018-06-03 DIAGNOSIS — Z7901 Long term (current) use of anticoagulants: Secondary | ICD-10-CM | POA: Diagnosis not present

## 2018-06-03 DIAGNOSIS — Z7951 Long term (current) use of inhaled steroids: Secondary | ICD-10-CM | POA: Diagnosis not present

## 2018-06-03 DIAGNOSIS — Z86711 Personal history of pulmonary embolism: Secondary | ICD-10-CM | POA: Diagnosis not present

## 2018-06-03 DIAGNOSIS — G4733 Obstructive sleep apnea (adult) (pediatric): Secondary | ICD-10-CM | POA: Diagnosis not present

## 2018-06-03 DIAGNOSIS — E785 Hyperlipidemia, unspecified: Secondary | ICD-10-CM | POA: Diagnosis not present

## 2018-06-03 DIAGNOSIS — Z794 Long term (current) use of insulin: Secondary | ICD-10-CM | POA: Diagnosis not present

## 2018-06-03 DIAGNOSIS — Z79899 Other long term (current) drug therapy: Secondary | ICD-10-CM | POA: Diagnosis not present

## 2018-06-03 DIAGNOSIS — I110 Hypertensive heart disease with heart failure: Secondary | ICD-10-CM | POA: Diagnosis not present

## 2018-06-03 DIAGNOSIS — N289 Disorder of kidney and ureter, unspecified: Secondary | ICD-10-CM | POA: Diagnosis not present

## 2018-06-03 DIAGNOSIS — K219 Gastro-esophageal reflux disease without esophagitis: Secondary | ICD-10-CM | POA: Diagnosis not present

## 2018-06-03 DIAGNOSIS — E1142 Type 2 diabetes mellitus with diabetic polyneuropathy: Secondary | ICD-10-CM | POA: Diagnosis not present

## 2018-06-03 NOTE — Progress Notes (Signed)
Daily Session Note  Patient Details  Name: [name] MRN: [id number] Date of Birth: 1961-05-19 Referring Provider:     Pulmonary Rehab from 03/17/2018 in [facility] Cardiac and Pulmonary Rehab  Referring Provider  [name] MD      Encounter Date: 06/03/2018  Check In: Session Check In - 06/03/18 1133      Check-In   Supervising physician immediately available to respond to emergencies  [facility] immediately available ER MD    Physician(s)  Drs. Alyse Low    Location  [facility]-Cardiac & Pulmonary Rehab    Staff Present  Alberteen Sam, MA, RCEP, CCRP, Exercise Physiologist;Joseph Foy [place], [place], ACSM CEP, Exercise Physiologist    Medication changes reported      No    Fall or balance concerns reported     No    Warm-up and Cool-down  Performed as group-led instruction    Resistance Training Performed  Yes    VAD Patient?  No    PAD/SET Patient?  No      Pain Assessment   Currently in Pain?  No/denies          Social History   Tobacco Use  Smoking Status Never Smoker  Smokeless Tobacco Never Used    Goals Met:  Proper associated with RPD/PD & O2 Sat Independence with exercise equipment Using PLB without cueing & demonstrates good technique Exercise tolerated well No report of cardiac concerns or symptoms Strength training completed today  Goals Unmet:  Not Applicable  Comments: Pt able to follow exercise prescription today without complaint.  Will continue to monitor for progression.  Reviewed home exercise with pt today.  Pt plans to walk in drive way and do chair exercises for exercise.  Reviewed THR, pulse, RPE, sign and symptoms, and when to call 911 or MD.  Also discussed weather considerations and indoor options.  Pt voiced understanding.   Dr. Emily Filbert is Medical Director for [place] and [facility].

## 2018-06-08 DIAGNOSIS — J9610 Chronic respiratory failure, unspecified whether with hypoxia or hypercapnia: Secondary | ICD-10-CM

## 2018-06-08 NOTE — Progress Notes (Signed)
Pulmonary Individual Treatment Plan  Patient Details  Name: [name] MRN: [id number] Date of Birth: 05-18-1961 Referring Provider:     Pulmonary Rehab from 03/17/2018 in [facility]iac [facility]monary Rehab  Referring Provider  [name] MD      Initial Encounter Date:    Pulmonary Rehab from 03/17/2018 in [facility] Cardiac and Pulmonary Rehab  Date  03/17/18      Visit Diagnosis: Chronic respiratory failure, unspecified whether with hypoxia or hypercapnia ([place])  Patient's Home Medications on Admission:  Current Outpatient Medications:  .  acetaminophen (TYLENOL) 325 MG tablet, Take 1 tablet (325 mg total) by mouth every 6 (six) hours as needed for mild pain (or Fever >/= 101)., Disp: , Rfl:  .  ALPRAZolam (XANAX) 0.5 MG tablet, Take 1 tablet (0.5 mg total) by mouth every 8 (eight) hours as needed for anxiety., Disp: 20 tablet, Rfl: 0 .  AMBULATORY NON FORMULARY MEDICATION, Medication Name: Trachea collar for use with nebulizer DX: J96.10, Disp: 1 each, Rfl: 0 .  AMBULATORY NON FORMULARY MEDICATION, Medication Name: Nebulizer DX: J96.10, Disp: 1 each, Rfl: 0 .  amiodarone (PACERONE) 200 MG tablet, Take 200 mg by mouth 2 (two) times daily., Disp: , Rfl: 8 .  atorvastatin (LIPITOR) 40 MG tablet, Take 1 tablet (40 mg total) by mouth daily at 6 PM., Disp: 30 tablet, Rfl: 1 .  atorvastatin (LIPITOR) 80 MG tablet, Take 80 mg by mouth at bedtime., Disp: , Rfl:  .  budesonide (PULMICORT) 0.5 MG/2ML nebulizer solution, Take 2 mLs (0.5 mg total) by nebulization 2 (two) times daily., Disp: 75 mL, Rfl: 12 .  busPIRone (BUSPAR) 10 MG tablet, Take 10 mg by mouth 2 (two) times daily as needed. , Disp: , Rfl:  .  collagenase (SANTYL) ointment, Apply topically daily., Disp: 15 g, Rfl: 0 .  enalapril (VASOTEC) 2.5 MG tablet, Take 1 tablet (2.5 mg total) by mouth daily., Disp: 30 tablet, Rfl: 0 .  feeding supplement, GLUCERNA SHAKE, (GLUCERNA SHAKE) LIQD, Take 237 mLs by mouth 2 (two) times  daily between meals., Disp: 60 Can, Rfl: 0 .  folic acid (FOLVITE) 1 MG tablet, Take 1 tablet (1 mg total) by mouth daily., Disp: 30 tablet, Rfl: 0 .  furosemide (LASIX) 40 MG tablet, Take 1 tablet (40 mg total) by mouth daily., Disp: 30 tablet, Rfl: 0 .  gabapentin (NEURONTIN) 800 MG tablet, Take 1 tablet (800 mg total) by mouth 3 (three) times daily. 1 tablet twice daily (Patient taking differently: Take 1,600 mg by mouth 2 (two) times daily. ), Disp: 60 tablet, Rfl: 0 .  insulin detemir (LEVEMIR) 100 unit/ml SOLN, Inject 20 Units into the skin 2 (two) times daily. , Disp: , Rfl:  .  insulin lispro (HUMALOG) 100 UNIT/ML injection, Inject 2-10 Units into the skin 3 (three) times daily with meals. , Disp: , Rfl:  .  ipratropium-albuterol (DUONEB) 0.5-2.5 (3) MG/3ML SOLN, Take 3 mLs by nebulization 3 (three) times daily., Disp: 360 mL, Rfl: 1 .  lamoTRIgine (LAMICTAL) 150 MG tablet, Take 150 mg by mouth 2 (two) times daily. , Disp: , Rfl:  .  metFORMIN (GLUCOPHAGE-XR) 500 MG 24 hr tablet, Take 1,000 mg by mouth 2 (two) times daily. , Disp: , Rfl:  .  metoprolol tartrate 75 MG TABS, Take 75 mg by mouth 2 (two) times daily. 100 mg twice daily, Disp: 60 tablet, Rfl: 0 .  Multiple Vitamin (MULTIVITAMIN WITH MINERALS) TABS tablet, Take 1 tablet by mouth daily., Disp: ,  Rfl:  .  omeprazole (PRILOSEC) 20 MG capsule, Take 20 mg by mouth daily. Once daily, Disp: , Rfl:  .  oxyCODONE-acetaminophen (PERCOCET) 10-325 MG tablet, Take 1 tablet by mouth 4 (four) times daily as needed for pain. , Disp: , Rfl:  .  polyethylene glycol (MIRALAX / GLYCOLAX) packet, Take 17 g by mouth daily as needed for mild constipation., Disp: 14 each, Rfl: 0 .  rivaroxaban (XARELTO) 20 MG TABS tablet, Take 20 mg by mouth daily., Disp: , Rfl:  .  senna-docusate (SENOKOT-S) 8.6-50 MG tablet, Take 2 tablets by mouth at bedtime as needed for mild constipation., Disp: , Rfl:  .  thiamine 100 MG tablet, Take 1 tablet (100 mg total) by mouth  daily., Disp: 30 tablet, Rfl: 0  Past Medical History: Past Medical History:  Diagnosis Date  . CHF (congestive heart failure) ([place])   . Diabetes mellitus ([place])   . Diabetic peripheral neuropathy ([place])   . Elevated liver enzymes    fatty liver per [facility]  . GERD (gastroesophageal reflux disease)   . History of pulmonary embolus (PE)   . Hyperlipidemia   . Hypertension   . Myocardial infarct ([place])   . OSA (obstructive sleep apnea)   . 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 Renal insufficiency     Tobacco Use: Social History   Tobacco Use  Smoking Status Never Smoker  Smokeless Tobacco Never Used    Labs: Recent Review Flowsheet Data    Labs for ITP Cardiac and Pulmonary Rehab Latest Ref Rng & Units 07/17/2017 10/08/2017 10/24/2017   Cholestrol 0 - 200 mg/dL 125 - -   LDLCALC 0 - 99 mg/dL 53 - -   HDL >40 mg/dL 56 - -   Trlycerides <150 mg/dL 82 - -   Hemoglobin A1c 4.8 - 5.6 % 6.1(H) 5.7(H) -   HCO3 20.0 - 28.0 mmol/L - - 32.3(H)   O2SAT % - - 80.2       Pulmonary Assessment Scores: Pulmonary Assessment Scores    Row Name 03/17/18 1451         ADL UCSD   ADL Phase  Entry     SOB Score total  63     Rest  2     Walk  2     Stairs  4     Bath  1     Dress  1     Shop  3       CAT Score   CAT Score  25       mMRC Score   mMRC Score  2        Pulmonary Function Assessment: Pulmonary Function Assessment - 03/17/18 1519      Breath   Bilateral Breath Sounds  Clear    Shortness of Breath  Limiting activity;Yes;Panic with Shortness of Breath       Exercise Target Goals: Exercise Program Goal: Individual exercise prescription set using results from initial 6 min walk test and THRR while considering  patient's activity barriers and safety.   Exercise Prescription Goal: Initial exercise prescription builds to 30-45 minutes a day of aerobic activity, 2-3 days per week.  Home exercise guidelines will be given to  patient during program as part of exercise prescription that the participant will acknowledge.  Activity Barriers & Risk Stratification: Activity Barriers & Cardiac Risk Stratification - 03/17/18 1522      Activity Barriers & Cardiac Risk  Stratification   Activity Barriers  Muscular Weakness;Deconditioning;Balance Concerns;History of Falls;Assistive Device;Shortness of Breath;Back Problems;Decreased Ventricular Function   occasional back spasm      6 Minute Walk: 6 Minute Walk    Row Name 03/17/18 1520         6 Minute Walk   Phase  Initial     Distance  525 feet used rollator     Walk Time  6 minutes     # of Rest Breaks  0     MPH  0.99     METS  1.65     RPE  12     Perceived Dyspnea   1     VO2 Peak  5.78     Symptoms  Yes (comment)     Comments  arms got tired, legs got tight, slightly dizzy headed     Resting HR  61 bpm     Resting BP  124/72     Resting Oxygen Saturation   92 %     Exercise Oxygen Saturation  during 6 min walk  80 %     Max Ex. HR  100 bpm     Max Ex. BP  146/64     2 Minute Post BP  136/72       Interval HR   1 Minute HR  75     2 Minute HR  80     3 Minute HR  91     4 Minute HR  98     5 Minute HR  100     6 Minute HR  90     2 Minute Post HR  60     Interval Heart Rate?  Yes       Interval Oxygen   Interval Oxygen?  Yes     Baseline Oxygen Saturation %  92 %     1 Minute Oxygen Saturation %  83 %     1 Minute Liters of Oxygen  0 L Room Air     2 Minute Oxygen Saturation %  82 %     2 Minute Liters of Oxygen  0 L     3 Minute Oxygen Saturation %  82 %     3 Minute Liters of Oxygen  0 L     4 Minute Oxygen Saturation %  82 %     4 Minute Liters of Oxygen  0 L     5 Minute Oxygen Saturation %  80 %     5 Minute Liters of Oxygen  0 L     6 Minute Oxygen Saturation %  82 %     6 Minute Liters of Oxygen  0 L     2 Minute Post Oxygen Saturation %  85 %     2 Minute Post Liters of Oxygen  0 L       Oxygen Initial  Assessment: Oxygen Initial Assessment - 03/17/18 1518      Home Oxygen   Home Oxygen Device  Home Concentrator    Sleep Oxygen Prescription  Continuous    Liters per minute  3.5    Home Exercise Oxygen Prescription  None    Home at Rest Exercise Oxygen Prescription  None    Compliance with Home Oxygen Use  Yes      Initial 6 min Walk   Oxygen Used  None      Program Oxygen Prescription   Program Oxygen Prescription  None      Intervention   Short Term Goals  To learn and exhibit compliance with exercise, home and travel O2 prescription;To learn and understand importance of monitoring SPO2 with pulse oximeter and demonstrate accurate use of the pulse oximeter.;To learn and understand importance of maintaining oxygen saturations>88%;To learn and demonstrate proper pursed lip breathing techniques or other breathing techniques.;To learn and demonstrate proper use of respiratory medications    Long  Term Goals  Exhibits compliance with exercise, home and travel O2 prescription;Verbalizes importance of monitoring SPO2 with pulse oximeter and return demonstration;Maintenance of O2 saturations>88%;Exhibits proper breathing techniques, such as pursed lip breathing or other method taught during program session;Compliance with respiratory medication;Demonstrates proper use of MDI's       Oxygen Re-Evaluation: Oxygen Re-Evaluation    Row Name 06/03/18 1504             Program Oxygen Prescription   Program Oxygen Prescription  None         Home Oxygen   Home Oxygen Device  Home Concentrator       Sleep Oxygen Prescription  Continuous       Liters per minute  3.5       Home Exercise Oxygen Prescription  None       Home at Rest Exercise Oxygen Prescription  None       Compliance with Home Oxygen Use  Yes         Goals/Expected Outcomes   Short Term Goals  To learn and exhibit compliance with exercise, home and travel O2 prescription;To learn and understand importance of monitoring SPO2  with pulse oximeter and demonstrate accurate use of the pulse oximeter.;To learn and understand importance of maintaining oxygen saturations>88%;To learn and demonstrate proper pursed lip breathing techniques or other breathing techniques.;To learn and demonstrate proper use of respiratory medications       Long  Term Goals  Exhibits compliance with exercise, home and travel O2 prescription;Verbalizes importance of monitoring SPO2 with pulse oximeter and return demonstration;Maintenance of O2 saturations>88%;Exhibits proper breathing techniques, such as pursed lip breathing or other method taught during program session;Compliance with respiratory medication;Demonstrates proper use of MDI's       Comments  Patient has a pulse oximeter at home and is to check his oxygen when he is exerting himself. Patient has a trach and is to practice at deep and diphragmatic breathing.       Goals/Expected Outcomes  Short: monitor oxygen at home with exertion. Long: maintain oxygen saturations above 88 percent independently.          Oxygen Discharge (Final Oxygen Re-Evaluation): Oxygen Re-Evaluation - 06/03/18 1504      Program Oxygen Prescription   Program Oxygen Prescription  None      Home Oxygen   Home Oxygen Device  Home Concentrator    Sleep Oxygen Prescription  Continuous    Liters per minute  3.5    Home Exercise Oxygen Prescription  None    Home at Rest Exercise Oxygen Prescription  None    Compliance with Home Oxygen Use  Yes      Goals/Expected Outcomes   Short Term Goals  To learn and exhibit compliance with exercise, home and travel O2 prescription;To learn and understand importance of monitoring SPO2 with pulse oximeter and demonstrate accurate use of the pulse oximeter.;To learn and understand importance of maintaining oxygen saturations>88%;To learn and demonstrate proper pursed lip breathing techniques or other breathing techniques.;To learn and demonstrate proper use of respiratory  medications    Long  Term Goals  Exhibits compliance with exercise, home and travel O2 prescription;Verbalizes importance of monitoring SPO2 with pulse oximeter and return demonstration;Maintenance of O2 saturations>88%;Exhibits proper breathing techniques, such as pursed lip breathing or other method taught during program session;Compliance with respiratory medication;Demonstrates proper use of MDI's    Comments  Patient has a pulse oximeter at home and is to check his oxygen when he is exerting himself. Patient has a trach and is to practice at deep and diphragmatic breathing.    Goals/Expected Outcomes  Short: monitor oxygen at home with exertion. Long: maintain oxygen saturations above 88 percent independently.       Initial Exercise Prescription: Initial Exercise Prescription - 03/17/18 1500      Date of Initial Exercise RX and Referring Provider   Date  03/17/18    Referring Provider  [name] MD      Oxygen   Oxygen  Continuous    Liters  2      Treadmill   MPH  0.8    Grade  0    Minutes  15    METs  1.6      Recumbant Elliptical   Level  1    RPM  50    Minutes  15    METs  1.6      T5 Nustep   Level  1    SPM  80    Minutes  15    METs  1.6      Prescription Details   Frequency (times per week)  3    Duration  Progress to 45 minutes of aerobic exercise without signs/symptoms of physical distress      Intensity   THRR 40-80% of Max Heartrate  102-144    Ratings of Perceived Exertion  11-13    Perceived Dyspnea  0-4      Progression   Progression  Continue to progress workloads to maintain intensity without signs/symptoms of physical distress.      Resistance Training   Training Prescription  Yes    Weight  4 lbs    Reps  10-15       Perform Capillary Blood Glucose checks as needed.  Exercise Prescription Changes: Exercise Prescription Changes    Row Name 03/17/18 1500 04/14/18 1500 04/28/18 1500 05/25/18 1600 06/03/18 1400      Response to Exercise   Blood Pressure (Admit)  124/72  130/62  128/62  124/74  -   Blood Pressure (Exercise)  146/64  138/76  140/62  -  -   Blood Pressure (Exit)  136/72  138/82  126/64  144/78  -   Heart Rate (Admit)  61 bpm  54 bpm  65 bpm  57 bpm  -   Heart Rate (Exercise)  100 bpm  78 bpm  89 bpm  131 bpm  -   Heart Rate (Exit)  60 bpm  53 bpm  62 bpm  64 bpm  -   Oxygen Saturation (Admit)  92 %  92 %  93 %  95 %  -   Oxygen Saturation (Exercise)  80 %  90 %  93 %  87 %  -   Oxygen Saturation (Exit)  85 %  93 %  98 %  93 %  -   Rating of Perceived Exertion (Exercise)  _0 -   Perceived Dyspnea (Exercise)  1  3  3  2  -   Symptoms  SOB, arms and legs tired and tight, slightly dizzy  SOB  SOB  SOB  -   Comments  walk test results  first full day of exercise  second full day of exercise  -  -   Duration  -  Progress to 45 minutes of aerobic exercise without signs/symptoms of physical distress  Progress to 45 minutes of aerobic exercise without signs/symptoms of physical distress  Progress to 45 minutes of aerobic exercise without signs/symptoms of physical distress rest breaks on treadmill  -   Intensity  -  THRR unchanged  THRR unchanged  THRR unchanged  -     Progression   Progression  -  Continue to progress workloads to maintain intensity without signs/symptoms of physical distress.  Continue to progress workloads to maintain intensity without signs/symptoms of physical distress.  Continue to progress workloads to maintain intensity without signs/symptoms of physical distress.  -   Average METs  -  2  1.55  -  -     Resistance Training   Training Prescription  -  Yes  Yes  Yes  -   Weight  -  4 lbs  4 lbs  4 lbs  -   Reps  -  10-15  10-15  10-15  -     Interval Training   Interval Training  -  No  No  No  -     Oxygen   Oxygen  -  -  -  -  -   Liters  -  -  -  -  -     Treadmill   MPH  -  0.8  0.8  0.5  -   Grade  -  0  0  0  -   Minutes  -  _0 -   METs   -  1.6  1.6  1.4  -     Recumbant Elliptical   Level  -  1  -  1  -   Minutes  -  15  -  15  -   METs  -  1.5  -  1.6  -     T5 Nustep   Level  -  _1 -   Minutes  -  _2 -   METs  -  2.9  1.5  4.8  -     Home Exercise Plan   Plans to continue exercise at  -  -  -  -  Home (comment) walk, chair exercises   Frequency  -  -  -  -  Add 1 additional day to program exercise sessions.   Initial Home Exercises Provided  -  -  -  -  06/03/18      Exercise Comments: Exercise Comments    Row Name 04/13/18 1153           Exercise Comments  First full day of exercise!  Patient was oriented to gym and equipment including functions, settings, policies, and procedures.  Patient's individual exercise prescription and treatment plan were reviewed.  All starting workloads were established based on the results of the 6 minute walk test done at initial orientation visit.  The plan for exercise progression was also introduced and progression will be customized based on patient's performance and goals.          Exercise Goals  and Review: Exercise Goals    Row Name 03/17/18 1526             Exercise Goals   Increase Physical Activity  Yes       Intervention  Provide advice, education, support and counseling about physical activity/exercise needs.;Develop an individualized exercise prescription for aerobic and resistive training based on initial evaluation findings, risk stratification, comorbidities and participant's personal goals.       Expected Outcomes  Short Term: Attend rehab on a regular basis to increase amount of physical activity.;Long Term: Add in home exercise to make exercise part of routine and to increase amount of physical activity.;Long Term: Exercising regularly at least 3-5 days a week.       Increase Strength and Stamina  Yes       Intervention  Provide advice, education, support and counseling about physical activity/exercise needs.;Develop an individualized  exercise prescription for aerobic and resistive training based on initial evaluation findings, risk stratification, comorbidities and participant's personal goals.       Expected Outcomes  Short Term: Increase workloads from initial exercise prescription for resistance, speed, and METs.;Short Term: Perform resistance training exercises routinely during rehab and add in resistance training at home;Long Term: Improve cardiorespiratory fitness, muscular endurance and strength as measured by increased METs and functional capacity (6MWT)       Able to understand and use rate of perceived exertion (RPE) scale  Yes       Intervention  Provide education and explanation on how to use RPE scale       Expected Outcomes  Short Term: Able to use RPE daily in rehab to express subjective intensity level;Long Term:  Able to use RPE to guide intensity level when exercising independently       Able to understand and use Dyspnea scale  Yes       Intervention  Provide education and explanation on how to use Dyspnea scale       Expected Outcomes  Short Term: Able to use Dyspnea scale daily in rehab to express subjective sense of shortness of breath during exertion;Long Term: Able to use Dyspnea scale to guide intensity level when exercising independently       Knowledge and understanding of Target Heart Rate Range (THRR)  Yes       Intervention  Provide education and explanation of THRR including how the numbers were predicted and where they are located for reference       Expected Outcomes  Short Term: Able to state/look up THRR;Short Term: Able to use daily as guideline for intensity in rehab;Long Term: Able to use THRR to govern intensity when exercising independently       Able to check pulse independently  Yes       Intervention  Provide education and demonstration on how to check pulse in carotid and radial arteries.;Review the importance of being able to check your own pulse for safety during independent exercise        Expected Outcomes  Short Term: Able to explain why pulse checking is important during independent exercise;Long Term: Able to check pulse independently and accurately       Understanding of Exercise Prescription  Yes       Intervention  Provide education, explanation, and written materials on patient's individual exercise prescription       Expected Outcomes  Short Term: Able to explain program exercise prescription;Long Term: Able to explain home exercise prescription to exercise independently  Exercise Goals Re-Evaluation : Exercise Goals Re-Evaluation    Row Name 04/13/18 1153 04/28/18 1510 05/12/18 1409 05/25/18 1638 06/03/18 1353     Exercise Goal Re-Evaluation   Exercise Goals Review  Increase Physical Activity;Able to understand and use rate of perceived exertion (RPE) scale;Understanding of Exercise Prescription;Increase Strength and Stamina;Able to understand and use Dyspnea scale  Increase Physical Activity;Increase Strength and Stamina;Understanding of Exercise Prescription  -  Increase Physical Activity;Increase Strength and Stamina;Understanding of Exercise Prescription  Increase Physical Activity;Increase Strength and Stamina;Understanding of Exercise Prescription;Able to understand and use rate of perceived exertion (RPE) scale;Able to understand and use Dyspnea scale;Knowledge and understanding of Target Heart Rate Range (THRR);Able to check pulse independently   Comments  Reviewed RPE scale, THR and program prescription with pt today.  Pt voiced understanding and was given a copy of goals to take home.   [name] has only attended once since last review.  He has been out sick.  He has completed two full days of exercise.  He hopes to return soon.   Out since last review  [name] has only attended twice since last review. He is limited by his transportation.  However, he would benefit more if he could attend more regularly. He is up to level 5 on the NuStep. We will continue to monitor  his progress.   Reviewed home exercise with pt today.  Pt plans to walk in drive way and do chair exercises for exercise.  Reviewed THR, pulse, RPE, sign and symptoms, and when to call 911 or MD.  Also discussed weather considerations and indoor options.  Pt voiced understanding.   Expected Outcomes  Short: Use RPE daily to regulate intensity. Long: Follow program prescription in THR.  Short: Attend class regularly.  Long: Continue to follow program prescription.   -  Short: Continue to increase endurance and attend regularly.  Long: Continue to increase strength and stamina.   Short: Start to move more at home.  Long: Continue to increase physcial activity.       Discharge Exercise Prescription (Final Exercise Prescription Changes): Exercise Prescription Changes - 06/03/18 1400      Home Exercise Plan   Plans to continue exercise at  Home (comment)   walk, chair exercises   Frequency  Add 1 additional day to program exercise sessions.    Initial Home Exercises Provided  06/03/18       Nutrition:  Target Goals: Understanding of nutrition guidelines, daily intake of sodium <1513m, cholesterol <2070m calories 30% from fat and 7% or less from saturated fats, daily to have 5 or more servings of fruits and vegetables.  Biometrics: Pre Biometrics - 03/17/18 1527      Pre Biometrics   Height  6' 1.5" (1.867 m)    Weight  (!) 313 lb 3.2 oz (142.1 kg)    Waist Circumference  53 inches    Hip Circumference  50 inches    Waist to Hip Ratio  1.06 %    BMI (Calculated)  40.76    Single Leg Stand  0 seconds        Nutrition Therapy Plan and Nutrition Goals: Nutrition Therapy & Goals - 04/13/18 1358      Nutrition Therapy   Diet  DM    Drug/Food Interactions  Statins/Certain Fruits    Protein (specify units)  12-13oz    Fiber  35 grams    Whole Grain Foods  3 servings   eats white-wheat or rye bread   Saturated  Fats  16 max. grams    Fruits and Vegetables  6 servings/day   8 ideal;  eats more fruits than vegetables   Sodium  2000 grams   does not need to pt reports doctor to tell pt not to worry about monitoring his sodium intake currently     Personal Nutrition Goals   Nutrition Goal  Keep intake of non-nutritious liquid calories IE beer to a minimum. Try to reduce daily consumption from 3-4/day to 1-2/day as an initial goal    Personal Goal #2  Choose evening snacks that are low sugar or zero sugar as opposed to pies and cakes. For example, sugar free pudding or fruit with yogurt    Comments  He has made several changes to his eating practices which has resulted in wt loss of 64# x2 years. BG control has improved, most recent 3 month average 130; HgbA1c 6.7. He has started to eat "lighter" meals and no longer drinks sodas. He has reduced frequency of eating food out of the home from several times per week to zero in the past several months. Drinks water and unsweetened iced tea for beverages, but also typically drinks 3-4 beers per day. Breakfast: oatmeal with fruit and honey, Lunch: apple and cheese, Dinner: soup & sandwich, occasional frozen pizza, beef stew. Typically has a sweet evening snack such as pie but has also tried some sugar free dessert options. Does not eat many vegetables at this time and does not feel that he eats a lot of meat      Intervention Plan   Intervention  Prescribe, educate and counsel regarding individualized specific dietary modifications aiming towards targeted core components such as weight, hypertension, lipid management, diabetes, heart failure and other comorbidities.    Expected Outcomes  Short Term Goal: Understand basic principles of dietary content, such as calories, fat, sodium, cholesterol and nutrients.;Short Term Goal: A plan has been developed with personal nutrition goals set during dietitian appointment.;Long Term Goal: Adherence to prescribed nutrition plan.       Nutrition Assessments:   Nutrition Goals  Re-Evaluation: Nutrition Goals Re-Evaluation    [place] Name 04/13/18 1424 05/13/18 1239           Goals   Nutrition Goal  Keep intake of non-nutritious liquid calories IE beer to a minimum. Try to reduce daily consumption from 3-4/day to 1-2/day as an initial goal  Keep intake of non-nutritious liquid calories IE beer to a minimum by starting to reduce the number of beers you drink per day; Choose evening snacks that are low or zero sugar more often      Comment  He has removed all other liquid calories such as soda and sweet tea from his diet but continues to drink alcohol daily. He desires to lose another 10-20#  He has lost 10-12# this month, wt loss was desired but amount was unexpeced per pt. He feels that he has had less fluid retention in his BLE which may account for some wt loss in addition to continued dietary interventions. He has continued to work on reducing the number of alcoholic beverages he drinks each week and does not drink other sugar sweetened beverages. He has also been minimizing snack intake, though sometimes still chooses cookies or pie "if it's around" in the evenings. Eating 2 meals/day typically and is including vegetables      Expected Outcome  He will reduce daily alcohol consumption until he is having an alcoholic beverage only sparingly rather than multiple  each day  He will monitor wt more closely and be aware of any edema to BLE. He will continue to work on reducing alcohol intake.        Personal Goal #2 Re-Evaluation   Personal Goal #2  Choose evening snacks that are low sugar or zero sugar as opposed to pies and cakes. For example, sugar free pudding or fruit with yogurt  -         Nutrition Goals Discharge (Final Nutrition Goals Re-Evaluation): Nutrition Goals Re-Evaluation - 05/13/18 1239      Goals   Nutrition Goal  Keep intake of non-nutritious liquid calories IE beer to a minimum by starting to reduce the number of beers you drink per day; Choose evening  snacks that are low or zero sugar more often    Comment  He has lost 10-12# this month, wt loss was desired but amount was unexpeced per pt. He feels that he has had less fluid retention in his BLE which may account for some wt loss in addition to continued dietary interventions. He has continued to work on reducing the number of alcoholic beverages he drinks each week and does not drink other sugar sweetened beverages. He has also been minimizing snack intake, though sometimes still chooses cookies or pie "if it's around" in the evenings. Eating 2 meals/day typically and is including vegetables    Expected Outcome  He will monitor wt more closely and be aware of any edema to BLE. He will continue to work on reducing alcohol intake.       Psychosocial: Target Goals: Acknowledge presence or absence of significant depression and/or stress, maximize coping skills, provide positive support system. Participant is able to verbalize types and ability to use techniques and skills needed for reducing stress and depression.   Initial Review & Psychosocial Screening: Initial Psych Review & Screening - 03/17/18 1520      Initial Review   Current issues with  Current Depression;History of Depression;Current Stress Concerns    Source of Stress Concerns  Chronic Illness;Unable to perform yard/household activities;Financial    Comments  Financial stress and relationship stress. He is unable to do things that he used to do.      Family Dynamics   Good Support System?  Yes    Comments  He can look to his brothers and his fiance. His mother is also supportive for him.      Barriers   Psychosocial barriers to participate in program  The patient should benefit from training in stress management and relaxation.      Screening Interventions   Interventions  Encouraged to exercise;To provide support and resources with identified psychosocial needs;Provide feedback about the scores to participant;Program counselor  consult    Expected Outcomes  Short Term goal: Utilizing psychosocial counselor, staff and physician to assist with identification of specific Stressors or current issues interfering with healing process. Setting desired goal for each stressor or current issue identified.;Long Term Goal: Stressors or current issues are controlled or eliminated.;Short Term goal: Identification and review with participant of any Quality of Life or Depression concerns found by scoring the questionnaire.;Long Term goal: The participant improves quality of Life and PHQ9 Scores as seen by post scores and/or verbalization of changes       Quality of Life Scores:  Scores of 19 and below usually indicate a poorer quality of life in these areas.  A difference of  2-3 points is a clinically meaningful difference.  A difference of  2-3 points in the total score of the Quality of Life Index has been associated with significant improvement in overall quality of life, self-image, physical symptoms, and general health in studies assessing change in quality of life.  PHQ-9: Recent Review Flowsheet Data    Depression screen [facility] 2/9 03/17/2018   Decreased Interest 3   Down, Depressed, Hopeless 2   PHQ - 2 Score 5   Altered sleeping 2   Tired, decreased energy 2   Change in appetite 1   Feeling bad or failure about yourself  2   Trouble concentrating 1   Moving slowly or fidgety/restless 1   Suicidal thoughts 0   PHQ-9 Score 14   Difficult doing work/chores Very difficult     Interpretation of Total Score  Total Score Depression Severity:  1-4 = Minimal depression, 5-9 = Mild depression, 10-14 = Moderate depression, 15-19 = Moderately severe depression, 20-27 = Severe depression   Psychosocial Evaluation and Intervention: Psychosocial Evaluation - 04/13/18 1215      Psychosocial Evaluation & Interventions   Interventions  Stress management education;Relaxation education;Encouraged to exercise with the program and follow  exercise prescription;Therapist referral    Comments  Counselor met with Mr. [name]) today for initial psychsoocial evaluation.  He is a 57 year old who has multiple health issues including a recent polyp removed from his airway.  [name] has had a trache for the past 9 years and also struggles with diabetes; neuropathy; GERD; CHF; obesity.  He sleeps well with a ventillator although he has OSA. [name] has a good support system with a partner of 14 years; and a mother who lives locally and brothers who stay in touch by phone.  He has a good appetite.  [name] reports a history of depression and anxiety and is on medications for these currently.  He states he is currently depressed and would like to see a counselor - if possible.  Counselor reviewed the PHQ-9 with [name] score of "14" - which indicated moderate symptoms for depression.  Additionally he has multiple stressors with his health and finances.  He would like to improve his breathing while in this program.  Counselor provided contact information to [name] for local therapist options to see if they are taking new patients.  Counselor will follow with him about this.     Expected Outcomes  Short:  [name] will contact a therapist to begin treatment for depressive symptoms.  [name] will exercise consistently in this program for his health and mental health - particularly positive coping strategies.  Long:  [name] will develop a healthy lifestyle routine of exercise, diet and positive coping strategies for his health and mental health.     Continue Psychosocial Services   Follow up required by counselor       Psychosocial Re-Evaluation: Psychosocial Re-Evaluation    [place] Name 06/03/18 1511             Psychosocial Re-Evaluation   Current issues with  Current Depression;History of Depression;Current Stress Concerns       Comments  Patient states that he sometimes does not make it to therapy because he is to depressed, He states that until now he has been  prescribed and anti depressant. He states he has to pick it up today, Informed him that it may be good for him to take it and see if it helps him. He gets beside himself when he thinks about his health issues.       Expected  Outcomes  Short: pick up his anti depressant medication. Long: take anti depressant medication regularly.       Interventions  Encouraged to attend Pulmonary Rehabilitation for the exercise       Continue Psychosocial Services   Follow up required by staff          Psychosocial Discharge (Final Psychosocial Re-Evaluation): Psychosocial Re-Evaluation - 06/03/18 1511      Psychosocial Re-Evaluation   Current issues with  Current Depression;History of Depression;Current Stress Concerns    Comments  Patient states that he sometimes does not make it to therapy because he is to depressed, He states that until now he has been prescribed and anti depressant. He states he has to pick it up today, Informed him that it may be good for him to take it and see if it helps him. He gets beside himself when he thinks about his health issues.    Expected Outcomes  Short: pick up his anti depressant medication. Long: take anti depressant medication regularly.    Interventions  Encouraged to attend Pulmonary Rehabilitation for the exercise    Continue Psychosocial Services   Follow up required by staff       Education: Education Goals: Education classes will be provided on a weekly basis, covering required topics. Participant will state understanding/return demonstration of topics presented.  Learning Barriers/Preferences: Learning Barriers/Preferences - 03/17/18 1525      Learning Barriers/Preferences   Learning Barriers  None    Learning Preferences  None       Education Topics:  Initial Evaluation Education: - Verbal, written and demonstration of respiratory meds, oximetry and breathing techniques. Instruction on use of nebulizers and MDIs and importance of monitoring MDI  activations.   Pulmonary Rehab from 06/03/2018 in [facility] Cardiac and Pulmonary Rehab  Date  03/17/18  Educator  [facility]  Instruction Review Code  1- Verbalizes Understanding      General Nutrition Guidelines/Fats and Fiber: -Group instruction provided by verbal, written material, models and posters to present the general guidelines for heart healthy nutrition. Gives an explanation and review of dietary fats and fiber.   Controlling Sodium/Reading Food Labels: -Group verbal and written material supporting the discussion of sodium use in heart healthy nutrition. Review and explanation with models, verbal and written materials for utilization of the food label.   Pulmonary Rehab from 06/03/2018 in [facility] Cardiac and Pulmonary Rehab  Date  05/13/18  Educator  [facility]  Instruction Review Code  1- Verbalizes Understanding      Exercise Physiology & General Exercise Guidelines: - Group verbal and written instruction with models to review the exercise physiology of the cardiovascular system and associated critical values. Provides general exercise guidelines with specific guidelines to those with heart or lung disease.    Aerobic Exercise & Resistance Training: - Gives group verbal and written instruction on the various components of exercise. Focuses on aerobic and resistive training programs and the benefits of this training and how to safely progress through these programs.   Flexibility, Balance, Mind/Body Relaxation: Provides group verbal/written instruction on the benefits of flexibility and balance training, including mind/body exercise modes such as yoga, pilates and tai chi.  Demonstration and skill practice provided.   Pulmonary Rehab from 06/03/2018 in [facility] Cardiac and Pulmonary Rehab  Date  04/15/18  Educator  [facility]  Instruction Review Code  1- Verbalizes Understanding      Stress and Anxiety: - Provides group verbal and written instruction about the health risks of elevated stress  and  causes of high stress.  Discuss the correlation between heart/lung disease and anxiety and treatment options. Review healthy ways to manage with stress and anxiety.   Depression: - Provides group verbal and written instruction on the correlation between heart/lung disease and depressed mood, treatment options, and the stigmas associated with seeking treatment.   Exercise & Equipment Safety: - Individual verbal instruction and demonstration of equipment use and safety with use of the equipment.   Pulmonary Rehab from 06/03/2018 in [facility] Cardiac and Pulmonary Rehab  Date  03/17/18  Educator  [facility]  Instruction Review Code  1- Verbalizes Understanding      Infection Prevention: - Provides verbal and written material to individual with discussion of infection control including proper hand washing and proper equipment cleaning during exercise session.   Pulmonary Rehab from 06/03/2018 in [facility] Cardiac and Pulmonary Rehab  Date  03/17/18  Educator  [facility]  Instruction Review Code  1- Verbalizes Understanding      Falls Prevention: - Provides verbal and written material to individual with discussion of falls prevention and safety.   Pulmonary Rehab from 06/03/2018 in [facility] Cardiac and Pulmonary Rehab  Date  03/17/18  Educator  [facility]  Instruction Review Code  1- Verbalizes Understanding      Diabetes: - Individual verbal and written instruction to review signs/symptoms of diabetes, desired ranges of glucose level fasting, after meals and with exercise. Advice that pre and post exercise glucose checks will be done for 3 sessions at entry of program.   Chronic Lung Diseases: - Group verbal and written instruction to review updates, respiratory medications, advancements in procedures and treatments. Discuss use of supplemental oxygen including available portable oxygen systems, continuous and intermittent flow rates, concentrators, personal use and safety guidelines. Review proper use of inhaler and  spacers. Provide informative websites for self-education.    Energy Conservation: - Provide group verbal and written instruction for methods to conserve energy, plan and organize activities. Instruct on pacing techniques, use of adaptive equipment and posture/positioning to relieve shortness of breath.   Triggers and Exacerbations: - Group verbal and written instruction to review types of environmental triggers and ways to prevent exacerbations. Discuss weather changes, air quality and the benefits of nasal washing. Review warning signs and symptoms to help prevent infections. Discuss techniques for effective airway clearance, coughing, and vibrations.   AED/CPR: - Group verbal and written instruction with the use of models to demonstrate the basic use of the AED with the basic ABC's of resuscitation.   Anatomy and Physiology of the Lungs: - Group verbal and written instruction with the use of models to provide basic lung anatomy and physiology related to function, structure and complications of lung disease.   Anatomy & Physiology of the Heart: - Group verbal and written instruction and models provide basic cardiac anatomy and physiology, with the coronary electrical and arterial systems. Review of Valvular disease and Heart Failure   Cardiac Medications: - Group verbal and written instruction to review commonly prescribed medications for heart disease. Reviews the medication, class of the drug, and side effects.   Know Your Numbers and Risk Factors: -Group verbal and written instruction about important numbers in your health.  Discussion of what are risk factors and how they play a role in the disease process.  Review of Cholesterol, Blood Pressure, Diabetes, and BMI and the role they play in your overall health.   Sleep Hygiene: -Provides group verbal and written instruction about how sleep can affect your health.  Define sleep hygiene, discuss sleep cycles and impact of sleep  habits. Review good sleep hygiene tips.    Pulmonary Rehab from 06/03/2018 in [facility] Cardiac and Pulmonary Rehab  Date  06/03/18  Educator  [facility]  Instruction Review Code  1- Verbalizes Understanding      Other: -Provides group and verbal instruction on various topics (see comments)    Knowledge Questionnaire Score: Knowledge Questionnaire Score - 03/17/18 1452      Knowledge Questionnaire Score   Pre Score  16/18   REVIEWED WITH patient       Core Components/Risk Factors/Patient Goals at Admission: Personal Goals and Risk Factors at Admission - 03/17/18 1525      Core Components/Risk Factors/Patient Goals on Admission    Weight Management  Weight Loss    Intervention  Weight Management: Develop a combined nutrition and exercise program designed to reach desired caloric intake, while maintaining appropriate intake of nutrient and fiber, sodium and fats, and appropriate energy expenditure required for the weight goal.;Weight Management/Obesity: Establish reasonable short term and long term weight goals.;Weight Management: Provide education and appropriate resources to help participant work on and attain dietary goals.    Admit Weight  313 lb 3.2 oz (142.1 kg)    Goal Weight: Short Term  308 lb (139.7 kg)    Goal Weight: Long Term  290 lb (131.5 kg)    Expected Outcomes  Short Term: Continue to assess and modify interventions until short term weight is achieved;Long Term: Adherence to nutrition and physical activity/exercise program aimed toward attainment of established weight goal;Understanding recommendations for meals to include 15-35% energy as protein, 25-35% energy from fat, 35-60% energy from carbohydrates, less than 249m of dietary cholesterol, 20-35 gm of total fiber daily;Understanding of distribution of calorie intake throughout the day with the consumption of 4-5 meals/snacks;Weight Loss: Understanding of general recommendations for a balanced deficit meal plan, which promotes  1-2 lb weight loss per week and includes a negative energy balance of [phone] kcal/d    Improve shortness of breath with ADL's  Yes    Intervention  Provide education, individualized exercise plan and daily activity instruction to help decrease symptoms of SOB with activities of daily living.    Expected Outcomes  Short Term: Improve cardiorespiratory fitness to achieve a reduction of symptoms when performing ADLs;Long Term: Be able to perform more ADLs without symptoms or delay the onset of symptoms    Diabetes  Yes    Intervention  Provide education about signs/symptoms and action to take for hypo/hyperglycemia.;Provide education about proper nutrition, including hydration, and aerobic/resistive exercise prescription along with prescribed medications to achieve blood glucose in normal ranges: Fasting glucose 65-99 mg/dL    Expected Outcomes  Short Term: Participant verbalizes understanding of the signs/symptoms and immediate care of hyper/hypoglycemia, proper foot care and importance of medication, aerobic/resistive exercise and nutrition plan for blood glucose control.;Long Term: Attainment of HbA1C < 7%.   his A1c was 6.5 he states   Heart Failure  Yes   pace maker   Intervention  Provide a combined exercise and nutrition program that is supplemented with education, support and counseling about heart failure. Directed toward relieving symptoms such as shortness of breath, decreased exercise tolerance, and extremity edema.    Expected Outcomes  Improve functional capacity of life;Short term: Attendance in program 2-3 days a week with increased exercise capacity. Reported lower sodium intake. Reported increased fruit and vegetable intake. Reports medication compliance.;Short term: Daily weights obtained and reported for increase. Utilizing  diuretic protocols set by physician.;Long term: Adoption of self-care skills and reduction of barriers for early signs and symptoms recognition and intervention  leading to self-care maintenance.    Hypertension  Yes    Intervention  Provide education on lifestyle modifcations including regular physical activity/exercise, weight management, moderate sodium restriction and increased consumption of fresh fruit, vegetables, and low fat dairy, alcohol moderation, and smoking cessation.;Monitor prescription use compliance.    Expected Outcomes  Short Term: Continued assessment and intervention until BP is < 140/72m HG in hypertensive participants. < 130/866mHG in hypertensive participants with diabetes, heart failure or chronic kidney disease.;Long Term: Maintenance of blood pressure at goal levels.    Lipids  Yes    Intervention  Provide education and support for participant on nutrition & aerobic/resistive exercise along with prescribed medications to achieve LDL <7076mHDL >55m61m  Expected Outcomes  Short Term: Participant states understanding of desired cholesterol values and is compliant with medications prescribed. Participant is following exercise prescription and nutrition guidelines.;Long Term: Cholesterol controlled with medications as prescribed, with individualized exercise RX and with personalized nutrition plan. Value goals: LDL < 70mg36mL > 40 mg.       Core Components/Risk Factors/Patient Goals Review:  Goals and Risk Factor Review    Row Name 06/03/18 1515             Core Components/Risk Factors/Patient Goals Review   Personal Goals Review  Weight Management/Obesity;Improve shortness of breath with ADL's;Heart Failure;Diabetes;Lipids       Review  [name]'s attendance has been sporadic since he suffers from depression. He states that after he exercises he feels better. Informed him that he has to hold on to that feeling he gets when he exercises and continue moving forward.       Expected Outcomes  Short: Attend [facility] regularly to improve shortness of breath with ADL's. Long: maintain independence with ADL's           Core  Components/Risk Factors/Patient Goals at Discharge (Final Review):  Goals and Risk Factor Review - 06/03/18 1515      Core Components/Risk Factors/Patient Goals Review   Personal Goals Review  Weight Management/Obesity;Improve shortness of breath with ADL's;Heart Failure;Diabetes;Lipids    Review  [name]'s attendance has been sporadic since he suffers from depression. He states that after he exercises he feels better. Informed him that he has to hold on to that feeling he gets when he exercises and continue moving forward.    Expected Outcomes  Short: Attend [facility] regularly to improve shortness of breath with ADL's. Long: maintain independence with ADL's        ITP Comments: ITP Comments    Row Name 03/17/18 1446 04/13/18 0855 04/28/18 1509 05/11/18 0841 05/13/18 1127   ITP Comments  Medical Evaluation completed. Chart sent for review and changes to Dr. Mark Emily [facility]ctor [place] [facility] [place] [facility]/27/18  30 day review completed. ITP sent to Dr. Mark Emily Filbertctor of [facility] with ITP unless changes are made by physician.  Called to check on pt.  He has been out since 04/15/18.  He said that last week he was out sick and that on Monday he couldn't get a ride scheduled.  He has a ride already set up for tomorrow and hopes to be here then.    30 day review completed. ITP sent to Dr. Mark Emily [facility]ctor [facility] with ITP unless changes are made by physician.  [name] [name]  today after being out for almost a month   [place] Name 06/08/18 0843           ITP Comments  30 day review completed. ITP sent to Dr. Emily Filbert Director of [place]. Continue with ITP unless changes are made by physician.          Comments: 30 day review

## 2018-06-10 ENCOUNTER — Encounter: Payer: Medicare HMO | Admitting: *Deleted

## 2018-06-10 DIAGNOSIS — J9610 Chronic respiratory failure, unspecified whether with hypoxia or hypercapnia: Secondary | ICD-10-CM

## 2018-06-10 LAB — GLUCOSE, CAPILLARY: GLUCOSE-CAPILLARY: 134 mg/dL — AB (ref 70–99)

## 2018-06-10 NOTE — Progress Notes (Signed)
Daily Session Note  Patient Details  Name: [name] MRN: [id number] Date of Birth: 07-29-1960 Referring Provider:     Pulmonary Rehab from 03/17/2018 in [facility] Cardiac and Pulmonary Rehab  Referring Provider  [name] MD      Encounter Date: 06/10/2018  Check In: Session Check In - 06/10/18 1122      Check-In   Supervising physician immediately available to respond to emergencies  [facility] immediately available ER MD    Physician(s)  Drs. Alyse Low    Location  [facility]-Cardiac & Pulmonary Rehab    Staff Present  Alberteen Sam, MA, RCEP, CCRP, Exercise Physiologist;Joseph Alcus Dad, RN BSN    Medication changes reported      No    Fall or balance concerns reported     No    Warm-up and Cool-down  Performed as group-led Higher education careers adviser Performed  Yes    VAD Patient?  No    PAD/SET Patient?  No      Pain Assessment   Currently in Pain?  No/denies          Social History   Tobacco Use  Smoking Status Never Smoker  Smokeless Tobacco Never Used    Goals Met:  Proper associated with RPD/PD & O2 Sat Independence with exercise equipment Using PLB without cueing & demonstrates good technique Exercise tolerated well No report of cardiac concerns or symptoms Strength training completed today  Goals Unmet:  Not Applicable  Comments: Pt able to follow exercise prescription today without complaint.  Will continue to monitor for progression.    Dr. Emily Filbert is Medical Director for [place] and [facility].

## 2018-06-15 ENCOUNTER — Encounter: Payer: Medicare HMO | Admitting: *Deleted

## 2018-06-15 DIAGNOSIS — J9610 Chronic respiratory failure, unspecified whether with hypoxia or hypercapnia: Secondary | ICD-10-CM

## 2018-06-15 NOTE — Progress Notes (Signed)
Daily Session Note  Patient Details  Name: [name] MRN: [id number] Date of Birth: 1961/03/27 Referring Provider:     Pulmonary Rehab from 03/17/2018 in [facility] Cardiac and Pulmonary Rehab  Referring Provider  [name] MD      Encounter Date: 06/15/2018  Check In: Session Check In - 06/15/18 1214      Check-In   Supervising physician immediately available to respond to emergencies  [facility] immediately available ER MD    Physician(s)  Dr. Clearnce Hasten and Dr. Joni Fears    Location  [facility]-Cardiac & Pulmonary Rehab    Staff Present  Earlean Shawl, BS, ACSM CEP, Exercise Physiologist;Jeanna Durrell BS, Exercise Physiologist;Amanda Oletta Darter, BA, ACSM CEP, Exercise Physiologist    Medication changes reported      No    Fall or balance concerns reported     No    Tobacco Cessation  No Change    Warm-up and Cool-down  Performed as group-led instruction    Resistance Training Performed  Yes    VAD Patient?  No    PAD/SET Patient?  No      Pain Assessment   Currently in Pain?  No/denies    Multiple Pain Sites  No          Social History   Tobacco Use  Smoking Status Never Smoker  Smokeless Tobacco Never Used    Goals Met:  Proper associated with RPD/PD & O2 Sat Independence with exercise equipment Exercise tolerated well No report of cardiac concerns or symptoms Strength training completed today  Goals Unmet:  Not Applicable  Comments: Pt able to follow exercise prescription today without complaint.  Will continue to monitor for progression.    Dr. Emily Filbert is Medical Director for [place] and [facility].

## 2018-06-22 ENCOUNTER — Ambulatory Visit: Payer: Medicare HMO | Admitting: Family Medicine

## 2018-06-26 ENCOUNTER — Encounter: Payer: Medicare HMO | Attending: Internal Medicine

## 2018-06-26 DIAGNOSIS — K219 Gastro-esophageal reflux disease without esophagitis: Secondary | ICD-10-CM | POA: Insufficient documentation

## 2018-06-26 DIAGNOSIS — Z79899 Other long term (current) drug therapy: Secondary | ICD-10-CM | POA: Insufficient documentation

## 2018-06-26 DIAGNOSIS — E785 Hyperlipidemia, unspecified: Secondary | ICD-10-CM | POA: Insufficient documentation

## 2018-06-26 DIAGNOSIS — Z7901 Long term (current) use of anticoagulants: Secondary | ICD-10-CM | POA: Insufficient documentation

## 2018-06-26 DIAGNOSIS — J9610 Chronic respiratory failure, unspecified whether with hypoxia or hypercapnia: Secondary | ICD-10-CM | POA: Insufficient documentation

## 2018-06-26 DIAGNOSIS — I110 Hypertensive heart disease with heart failure: Secondary | ICD-10-CM | POA: Insufficient documentation

## 2018-06-26 DIAGNOSIS — I509 Heart failure, unspecified: Secondary | ICD-10-CM | POA: Insufficient documentation

## 2018-06-26 DIAGNOSIS — N289 Disorder of kidney and ureter, unspecified: Secondary | ICD-10-CM | POA: Insufficient documentation

## 2018-06-26 DIAGNOSIS — Z7951 Long term (current) use of inhaled steroids: Secondary | ICD-10-CM | POA: Insufficient documentation

## 2018-06-26 DIAGNOSIS — Z86711 Personal history of pulmonary embolism: Secondary | ICD-10-CM | POA: Insufficient documentation

## 2018-06-26 DIAGNOSIS — I252 Old myocardial infarction: Secondary | ICD-10-CM | POA: Insufficient documentation

## 2018-06-26 DIAGNOSIS — G4733 Obstructive sleep apnea (adult) (pediatric): Secondary | ICD-10-CM | POA: Insufficient documentation

## 2018-06-26 DIAGNOSIS — E1142 Type 2 diabetes mellitus with diabetic polyneuropathy: Secondary | ICD-10-CM | POA: Insufficient documentation

## 2018-06-26 DIAGNOSIS — Z794 Long term (current) use of insulin: Secondary | ICD-10-CM | POA: Insufficient documentation

## 2018-07-06 DIAGNOSIS — J9610 Chronic respiratory failure, unspecified whether with hypoxia or hypercapnia: Secondary | ICD-10-CM

## 2018-07-06 NOTE — Progress Notes (Signed)
Pulmonary Individual Treatment Plan  Patient Details  Name: Malon Branton MRN: [id number] Date of Birth: 10/08/1960 Referring Provider:     Pulmonary Rehab from 03/17/2018 in [facility] Cardiac and Pulmonary Rehab  Referring Provider  [name] MD      Initial Encounter Date:    Pulmonary Rehab from 03/17/2018 in [facility] Cardiac and Pulmonary Rehab  Date  03/17/18      Visit Diagnosis: Chronic respiratory failure, unspecified whether with hypoxia or hypercapnia ([place])  Patient's Home Medications on Admission:  Current Outpatient Medications:  .  acetaminophen (TYLENOL) 325 MG tablet, Take 1 tablet (325 mg total) by mouth every 6 (six) hours as needed for mild pain (or Fever >/= 101)., Disp: , Rfl:  .  ALPRAZolam (XANAX) 0.5 MG tablet, Take 1 tablet (0.5 mg total) by mouth every 8 (eight) hours as needed for anxiety., Disp: 20 tablet, Rfl: 0 .  AMBULATORY NON FORMULARY MEDICATION, Medication Name: Trachea collar for use with nebulizer DX: J96.10, Disp: 1 each, Rfl: 0 .  AMBULATORY NON FORMULARY MEDICATION, Medication Name: Nebulizer DX: J96.10, Disp: 1 each, Rfl: 0 .  amiodarone (PACERONE) 200 MG tablet, Take 200 mg by mouth 2 (two) times daily., Disp: , Rfl: 8 .  atorvastatin (LIPITOR) 40 MG tablet, Take 1 tablet (40 mg total) by mouth daily at 6 PM., Disp: 30 tablet, Rfl: 1 .  atorvastatin (LIPITOR) 80 MG tablet, Take 80 mg by mouth at bedtime., Disp: , Rfl:  .  budesonide (PULMICORT) 0.5 MG/2ML nebulizer solution, Take 2 mLs (0.5 mg total) by nebulization 2 (two) times daily., Disp: 75 mL, Rfl: 12 .  busPIRone (BUSPAR) 10 MG tablet, Take 10 mg by mouth 2 (two) times daily as needed. , Disp: , Rfl:  .  collagenase (SANTYL) ointment, Apply topically daily., Disp: 15 g, Rfl: 0 .  enalapril (VASOTEC) 2.5 MG tablet, Take 1 tablet (2.5 mg total) by mouth daily., Disp: 30 tablet, Rfl: 0 .  feeding supplement, GLUCERNA SHAKE, (GLUCERNA SHAKE) LIQD, Take 237 mLs by mouth 2 (two) times  daily between meals., Disp: 60 Can, Rfl: 0 .  folic acid (FOLVITE) 1 MG tablet, Take 1 tablet (1 mg total) by mouth daily., Disp: 30 tablet, Rfl: 0 .  furosemide (LASIX) 40 MG tablet, Take 1 tablet (40 mg total) by mouth daily., Disp: 30 tablet, Rfl: 0 .  gabapentin (NEURONTIN) 800 MG tablet, Take 1 tablet (800 mg total) by mouth 3 (three) times daily. 1 tablet twice daily (Patient taking differently: Take 1,600 mg by mouth 2 (two) times daily. ), Disp: 60 tablet, Rfl: 0 .  insulin detemir (LEVEMIR) 100 unit/ml SOLN, Inject 20 Units into the skin 2 (two) times daily. , Disp: , Rfl:  .  insulin lispro (HUMALOG) 100 UNIT/ML injection, Inject 2-10 Units into the skin 3 (three) times daily with meals. , Disp: , Rfl:  .  ipratropium-albuterol (DUONEB) 0.5-2.5 (3) MG/3ML SOLN, Take 3 mLs by nebulization 3 (three) times daily., Disp: 360 mL, Rfl: 1 .  lamoTRIgine (LAMICTAL) 150 MG tablet, Take 150 mg by mouth 2 (two) times daily. , Disp: , Rfl:  .  metFORMIN (GLUCOPHAGE-XR) 500 MG 24 hr tablet, Take 1,000 mg by mouth 2 (two) times daily. , Disp: , Rfl:  .  metoprolol tartrate 75 MG TABS, Take 75 mg by mouth 2 (two) times daily. 100 mg twice daily, Disp: 60 tablet, Rfl: 0 .  Multiple Vitamin (MULTIVITAMIN WITH MINERALS) TABS tablet, Take 1 tablet by mouth daily., Disp: ,  Rfl:  .  omeprazole (PRILOSEC) 20 MG capsule, Take 20 mg by mouth daily. Once daily, Disp: , Rfl:  .  oxyCODONE-acetaminophen (PERCOCET) 10-325 MG tablet, Take 1 tablet by mouth 4 (four) times daily as needed for pain. , Disp: , Rfl:  .  polyethylene glycol (MIRALAX / GLYCOLAX) packet, Take 17 g by mouth daily as needed for mild constipation., Disp: 14 each, Rfl: 0 .  rivaroxaban (XARELTO) 20 MG TABS tablet, Take 20 mg by mouth daily., Disp: , Rfl:  .  senna-docusate (SENOKOT-S) 8.6-50 MG tablet, Take 2 tablets by mouth at bedtime as needed for mild constipation., Disp: , Rfl:  .  thiamine 100 MG tablet, Take 1 tablet (100 mg total) by mouth  daily., Disp: 30 tablet, Rfl: 0  Past Medical History: Past Medical History:  Diagnosis Date  . CHF (congestive heart failure) ([place])   . Diabetes mellitus ([place])   . Diabetic peripheral neuropathy ([place])   . Elevated liver enzymes    fatty liver per [facility]  . GERD (gastroesophageal reflux disease)   . History of pulmonary embolus (PE)   . Hyperlipidemia   . Hypertension   . Myocardial infarct ([place])   . OSA (obstructive sleep apnea)   . 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 Renal insufficiency     Tobacco Use: Social History   Tobacco Use  Smoking Status Never Smoker  Smokeless Tobacco Never Used    Labs: Recent Review Flowsheet Data    Labs for ITP Cardiac and Pulmonary Rehab Latest Ref Rng & Units 07/17/2017 10/08/2017 10/24/2017   Cholestrol 0 - 200 mg/dL 125 - -   LDLCALC 0 - 99 mg/dL 53 - -   HDL >40 mg/dL 56 - -   Trlycerides <150 mg/dL 82 - -   Hemoglobin A1c 4.8 - 5.6 % 6.1(H) 5.7(H) -   HCO3 20.0 - 28.0 mmol/L - - 32.3(H)   O2SAT % - - 80.2       Pulmonary Assessment Scores: Pulmonary Assessment Scores    Row Name 03/17/18 1451         ADL UCSD   ADL Phase  Entry     SOB Score total  63     Rest  2     Walk  2     Stairs  4     Bath  1     Dress  1     Shop  3       CAT Score   CAT Score  25       mMRC Score   mMRC Score  2        Pulmonary Function Assessment: Pulmonary Function Assessment - 03/17/18 1519      Breath   Bilateral Breath Sounds  Clear    Shortness of Breath  Limiting activity;Yes;Panic with Shortness of Breath       Exercise Target Goals: Exercise Program Goal: Individual exercise prescription set using results from initial 6 min walk test and THRR while considering  patient's activity barriers and safety.   Exercise Prescription Goal: Initial exercise prescription builds to 30-45 minutes a day of aerobic activity, 2-3 days per week.  Home exercise guidelines will be given to  patient during program as part of exercise prescription that the participant will acknowledge.  Activity Barriers & Risk Stratification: Activity Barriers & Cardiac Risk Stratification - 03/17/18 1522      Activity Barriers & Cardiac Risk  Stratification   Activity Barriers  Muscular Weakness;Deconditioning;Balance Concerns;History of Falls;Assistive Device;Shortness of Breath;Back Problems;Decreased Ventricular Function   occasional back spasm      6 Minute Walk: 6 Minute Walk    Row Name 03/17/18 1520         6 Minute Walk   Phase  Initial     Distance  525 feet used rollator     Walk Time  6 minutes     # of Rest Breaks  0     MPH  0.99     METS  1.65     RPE  12     Perceived Dyspnea   1     VO2 Peak  5.78     Symptoms  Yes (comment)     Comments  arms got tired, legs got tight, slightly dizzy headed     Resting HR  61 bpm     Resting BP  124/72     Resting Oxygen Saturation   92 %     Exercise Oxygen Saturation  during 6 min walk  80 %     Max Ex. HR  100 bpm     Max Ex. BP  146/64     2 Minute Post BP  136/72       Interval HR   1 Minute HR  75     2 Minute HR  80     3 Minute HR  91     4 Minute HR  98     5 Minute HR  100     6 Minute HR  90     2 Minute Post HR  60     Interval Heart Rate?  Yes       Interval Oxygen   Interval Oxygen?  Yes     Baseline Oxygen Saturation %  92 %     1 Minute Oxygen Saturation %  83 %     1 Minute Liters of Oxygen  0 L Room Air     2 Minute Oxygen Saturation %  82 %     2 Minute Liters of Oxygen  0 L     3 Minute Oxygen Saturation %  82 %     3 Minute Liters of Oxygen  0 L     4 Minute Oxygen Saturation %  82 %     4 Minute Liters of Oxygen  0 L     5 Minute Oxygen Saturation %  80 %     5 Minute Liters of Oxygen  0 L     6 Minute Oxygen Saturation %  82 %     6 Minute Liters of Oxygen  0 L     2 Minute Post Oxygen Saturation %  85 %     2 Minute Post Liters of Oxygen  0 L       Oxygen Initial  Assessment: Oxygen Initial Assessment - 03/17/18 1518      Home Oxygen   Home Oxygen Device  Home Concentrator    Sleep Oxygen Prescription  Continuous    Liters per minute  3.5    Home Exercise Oxygen Prescription  None    Home at Rest Exercise Oxygen Prescription  None    Compliance with Home Oxygen Use  Yes      Initial 6 min Walk   Oxygen Used  None      Program Oxygen Prescription   Program Oxygen Prescription  None      Intervention   Short Term Goals  To learn and exhibit compliance with exercise, home and travel O2 prescription;To learn and understand importance of monitoring SPO2 with pulse oximeter and demonstrate accurate use of the pulse oximeter.;To learn and understand importance of maintaining oxygen saturations>88%;To learn and demonstrate proper pursed lip breathing techniques or other breathing techniques.;To learn and demonstrate proper use of respiratory medications    Long  Term Goals  Exhibits compliance with exercise, home and travel O2 prescription;Verbalizes importance of monitoring SPO2 with pulse oximeter and return demonstration;Maintenance of O2 saturations>88%;Exhibits proper breathing techniques, such as pursed lip breathing or other method taught during program session;Compliance with respiratory medication;Demonstrates proper use of MDI's       Oxygen Re-Evaluation: Oxygen Re-Evaluation    Row Name 06/03/18 1504             Program Oxygen Prescription   Program Oxygen Prescription  None         Home Oxygen   Home Oxygen Device  Home Concentrator       Sleep Oxygen Prescription  Continuous       Liters per minute  3.5       Home Exercise Oxygen Prescription  None       Home at Rest Exercise Oxygen Prescription  None       Compliance with Home Oxygen Use  Yes         Goals/Expected Outcomes   Short Term Goals  To learn and exhibit compliance with exercise, home and travel O2 prescription;To learn and understand importance of monitoring SPO2  with pulse oximeter and demonstrate accurate use of the pulse oximeter.;To learn and understand importance of maintaining oxygen saturations>88%;To learn and demonstrate proper pursed lip breathing techniques or other breathing techniques.;To learn and demonstrate proper use of respiratory medications       Long  Term Goals  Exhibits compliance with exercise, home and travel O2 prescription;Verbalizes importance of monitoring SPO2 with pulse oximeter and return demonstration;Maintenance of O2 saturations>88%;Exhibits proper breathing techniques, such as pursed lip breathing or other method taught during program session;Compliance with respiratory medication;Demonstrates proper use of MDI's       Comments  Patient has a pulse oximeter at home and is to check his oxygen when he is exerting himself. Patient has a trach and is to practice at deep and diphragmatic breathing.       Goals/Expected Outcomes  Short: monitor oxygen at home with exertion. Long: maintain oxygen saturations above 88 percent independently.          Oxygen Discharge (Final Oxygen Re-Evaluation): Oxygen Re-Evaluation - 06/03/18 1504      Program Oxygen Prescription   Program Oxygen Prescription  None      Home Oxygen   Home Oxygen Device  Home Concentrator    Sleep Oxygen Prescription  Continuous    Liters per minute  3.5    Home Exercise Oxygen Prescription  None    Home at Rest Exercise Oxygen Prescription  None    Compliance with Home Oxygen Use  Yes      Goals/Expected Outcomes   Short Term Goals  To learn and exhibit compliance with exercise, home and travel O2 prescription;To learn and understand importance of monitoring SPO2 with pulse oximeter and demonstrate accurate use of the pulse oximeter.;To learn and understand importance of maintaining oxygen saturations>88%;To learn and demonstrate proper pursed lip breathing techniques or other breathing techniques.;To learn and demonstrate proper use of respiratory  medications    Long  Term Goals  Exhibits compliance with exercise, home and travel O2 prescription;Verbalizes importance of monitoring SPO2 with pulse oximeter and return demonstration;Maintenance of O2 saturations>88%;Exhibits proper breathing techniques, such as pursed lip breathing or other method taught during program session;Compliance with respiratory medication;Demonstrates proper use of MDI's    Comments  Patient has a pulse oximeter at home and is to check his oxygen when he is exerting himself. Patient has a trach and is to practice at deep and diphragmatic breathing.    Goals/Expected Outcomes  Short: monitor oxygen at home with exertion. Long: maintain oxygen saturations above 88 percent independently.       Initial Exercise Prescription: Initial Exercise Prescription - 03/17/18 1500      Date of Initial Exercise RX and Referring Provider   Date  03/17/18    Referring Provider  [name] MD      Oxygen   Oxygen  Continuous    Liters  2      Treadmill   MPH  0.8    Grade  0    Minutes  15    METs  1.6      Recumbant Elliptical   Level  1    RPM  50    Minutes  15    METs  1.6      T5 Nustep   Level  1    SPM  80    Minutes  15    METs  1.6      Prescription Details   Frequency (times per week)  3    Duration  Progress to 45 minutes of aerobic exercise without signs/symptoms of physical distress      Intensity   THRR 40-80% of Max Heartrate  102-144    Ratings of Perceived Exertion  11-13    Perceived Dyspnea  0-4      Progression   Progression  Continue to progress workloads to maintain intensity without signs/symptoms of physical distress.      Resistance Training   Training Prescription  Yes    Weight  4 lbs    Reps  10-15       Perform Capillary Blood Glucose checks as needed.  Exercise Prescription Changes: Exercise Prescription Changes    Row Name 03/17/18 1500 04/14/18 1500 04/28/18 1500 05/25/18 1600 06/03/18 1400      Response to Exercise   Blood Pressure (Admit)  124/72  130/62  128/62  124/74  -   Blood Pressure (Exercise)  146/64  138/76  140/62  -  -   Blood Pressure (Exit)  136/72  138/82  126/64  144/78  -   Heart Rate (Admit)  61 bpm  54 bpm  65 bpm  57 bpm  -   Heart Rate (Exercise)  100 bpm  78 bpm  89 bpm  131 bpm  -   Heart Rate (Exit)  60 bpm  53 bpm  62 bpm  64 bpm  -   Oxygen Saturation (Admit)  92 %  92 %  93 %  95 %  -   Oxygen Saturation (Exercise)  80 %  90 %  93 %  87 %  -   Oxygen Saturation (Exit)  85 %  93 %  98 %  93 %  -   Rating of Perceived Exertion (Exercise)  _0 -   Perceived Dyspnea (Exercise)  1  3  3  2  -   Symptoms  SOB, arms and legs tired and tight, slightly dizzy  SOB  SOB  SOB  -   Comments  walk test results  first full day of exercise  second full day of exercise  -  -   Duration  -  Progress to 45 minutes of aerobic exercise without signs/symptoms of physical distress  Progress to 45 minutes of aerobic exercise without signs/symptoms of physical distress  Progress to 45 minutes of aerobic exercise without signs/symptoms of physical distress rest breaks on treadmill  -   Intensity  -  THRR unchanged  THRR unchanged  THRR unchanged  -     Progression   Progression  -  Continue to progress workloads to maintain intensity without signs/symptoms of physical distress.  Continue to progress workloads to maintain intensity without signs/symptoms of physical distress.  Continue to progress workloads to maintain intensity without signs/symptoms of physical distress.  -   Average METs  -  2  1.55  -  -     Resistance Training   Training Prescription  -  Yes  Yes  Yes  -   Weight  -  4 lbs  4 lbs  4 lbs  -   Reps  -  10-15  10-15  10-15  -     Interval Training   Interval Training  -  No  No  No  -     Oxygen   Oxygen  -  -  -  -  -   Liters  -  -  -  -  -     Treadmill   MPH  -  0.8  0.8  0.5  -   Grade  -  0  0  0  -   Minutes  -  _0 -   METs   -  1.6  1.6  1.4  -     Recumbant Elliptical   Level  -  1  -  1  -   Minutes  -  15  -  15  -   METs  -  1.5  -  1.6  -     T5 Nustep   Level  -  _1 -   Minutes  -  _2 -   METs  -  2.9  1.5  4.8  -     Home Exercise Plan   Plans to continue exercise at  -  -  -  -  Home (comment) walk, chair exercises   Frequency  -  -  -  -  Add 1 additional day to program exercise sessions.   Initial Home Exercises Provided  -  -  -  -  06/03/18   Row Name 06/09/18 1200 06/25/18 1000           Response to Exercise   Blood Pressure (Admit)  118/60  122/78      Blood Pressure (Exercise)  136/70  -      Blood Pressure (Exit)  118/74  118/70      Heart Rate (Admit)  55 bpm  64 bpm      Heart Rate (Exercise)  93 bpm  73 bpm      Heart Rate (Exit)  64 bpm  74 bpm      Oxygen Saturation (Admit)  90 %  89 %  Oxygen Saturation (Exercise)  91 %  91 %      Oxygen Saturation (Exit)  91 %  91 %      Rating of Perceived Exertion (Exercise)  13  14      Perceived Dyspnea (Exercise)  2  3      Symptoms  SOB  none      Duration  Progress to 45 minutes of aerobic exercise without signs/symptoms of physical distress breaks on treadmill  Progress to 45 minutes of aerobic exercise without signs/symptoms of physical distress      Intensity  THRR unchanged  THRR unchanged        Progression   Progression  Continue to progress workloads to maintain intensity without signs/symptoms of physical distress.  Continue to progress workloads to maintain intensity without signs/symptoms of physical distress.      Average METs  1.5  1.7        Resistance Training   Training Prescription  Yes  Yes      Weight  4 lbs  4 lb      Reps  10-15  10-15        Interval Training   Interval Training  No  No        Treadmill   MPH  0.5  0.5      Grade  0  0      Minutes  15  15      METs  1.4  1.4        Recumbant Elliptical   Level  1  1      Minutes  15  15      METs  1.6  1.7        T5 Nustep    Level  -  1      SPM  -  50      Minutes  -  15      METs  -  2.1        Home Exercise Plan   Plans to continue exercise at  Home (comment) walk, chair exercises  Home (comment) walk, chair exercises      Frequency  Add 1 additional day to program exercise sessions.  Add 1 additional day to program exercise sessions.      Initial Home Exercises Provided  06/03/18  06/03/18         Exercise Comments: Exercise Comments    Row Name 04/13/18 1153           Exercise Comments  First full day of exercise!  Patient was oriented to gym and equipment including functions, settings, policies, and procedures.  Patient's individual exercise prescription and treatment plan were reviewed.  All starting workloads were established based on the results of the 6 minute walk test done at initial orientation visit.  The plan for exercise progression was also introduced and progression will be customized based on patient's performance and goals.          Exercise Goals and Review: Exercise Goals    Row Name 03/17/18 1526             Exercise Goals   Increase Physical Activity  Yes       Intervention  Provide advice, education, support and counseling about physical activity/exercise needs.;Develop an individualized exercise prescription for aerobic and resistive training based on initial evaluation findings, risk stratification, comorbidities and participant's personal goals.       Expected Outcomes  Short Term: Attend  rehab on a regular basis to increase amount of physical activity.;Long Term: Add in home exercise to make exercise part of routine and to increase amount of physical activity.;Long Term: Exercising regularly at least 3-5 days a week.       Increase Strength and Stamina  Yes       Intervention  Provide advice, education, support and counseling about physical activity/exercise needs.;Develop an individualized exercise prescription for aerobic and resistive training based on initial  evaluation findings, risk stratification, comorbidities and participant's personal goals.       Expected Outcomes  Short Term: Increase workloads from initial exercise prescription for resistance, speed, and METs.;Short Term: Perform resistance training exercises routinely during rehab and add in resistance training at home;Long Term: Improve cardiorespiratory fitness, muscular endurance and strength as measured by increased METs and functional capacity (6MWT)       Able to understand and use rate of perceived exertion (RPE) scale  Yes       Intervention  Provide education and explanation on how to use RPE scale       Expected Outcomes  Short Term: Able to use RPE daily in rehab to express subjective intensity level;Long Term:  Able to use RPE to guide intensity level when exercising independently       Able to understand and use Dyspnea scale  Yes       Intervention  Provide education and explanation on how to use Dyspnea scale       Expected Outcomes  Short Term: Able to use Dyspnea scale daily in rehab to express subjective sense of shortness of breath during exertion;Long Term: Able to use Dyspnea scale to guide intensity level when exercising independently       Knowledge and understanding of Target Heart Rate Range (THRR)  Yes       Intervention  Provide education and explanation of THRR including how the numbers were predicted and where they are located for reference       Expected Outcomes  Short Term: Able to state/look up THRR;Short Term: Able to use daily as guideline for intensity in rehab;Long Term: Able to use THRR to govern intensity when exercising independently       Able to check pulse independently  Yes       Intervention  Provide education and demonstration on how to check pulse in carotid and radial arteries.;Review the importance of being able to check your own pulse for safety during independent exercise       Expected Outcomes  Short Term: Able to explain why pulse checking is  important during independent exercise;Long Term: Able to check pulse independently and accurately       Understanding of Exercise Prescription  Yes       Intervention  Provide education, explanation, and written materials on patient's individual exercise prescription       Expected Outcomes  Short Term: Able to explain program exercise prescription;Long Term: Able to explain home exercise prescription to exercise independently          Exercise Goals Re-Evaluation : Exercise Goals Re-Evaluation    Row Name 04/13/18 1153 04/28/18 1510 05/12/18 1409 05/25/18 1638 06/03/18 1353     Exercise Goal Re-Evaluation   Exercise Goals Review  Increase Physical Activity;Able to understand and use rate of perceived exertion (RPE) scale;Understanding of Exercise Prescription;Increase Strength and Stamina;Able to understand and use Dyspnea scale  Increase Physical Activity;Increase Strength and Stamina;Understanding of Exercise Prescription  -  Increase Physical Activity;Increase Strength and  Stamina;Understanding of Exercise Prescription  Increase Physical Activity;Increase Strength and Stamina;Understanding of Exercise Prescription;Able to understand and use rate of perceived exertion (RPE) scale;Able to understand and use Dyspnea scale;Knowledge and understanding of Target Heart Rate Range (THRR);Able to check pulse independently   Comments  Reviewed RPE scale, THR and program prescription with pt today.  Pt voiced understanding and was given a copy of goals to take home.   Ramone has only attended once since last review.  He has been out sick.  He has completed two full days of exercise.  He hopes to return soon.   Out since last review  Eutimio has only attended twice since last review. He is limited by his transportation.  However, he would benefit more if he could attend more regularly. He is up to level 5 on the NuStep. We will continue to monitor his progress.   Reviewed home exercise with pt today.  Pt plans to walk  in drive way and do chair exercises for exercise.  Reviewed THR, pulse, RPE, sign and symptoms, and when to call 911 or MD.  Also discussed weather considerations and indoor options.  Pt voiced understanding.   Expected Outcomes  Short: Use RPE daily to regulate intensity. Long: Follow program prescription in THR.  Short: Attend class regularly.  Long: Continue to follow program prescription.   -  Short: Continue to increase endurance and attend regularly.  Long: Continue to increase strength and stamina.   Short: Start to move more at home.  Long: Continue to increase physcial activity.    [place] Name 06/09/18 1243 06/25/18 1032           Exercise Goal Re-Evaluation   Exercise Goals Review  Increase Physical Activity;Increase Strength and Stamina;Understanding of Exercise Prescription  Increase Physical Activity;Increase Strength and Stamina;Able to understand and use rate of perceived exertion (RPE) scale;Able to understand and use Dyspnea scale      Comments  Jamarian has only attended once since last review.  He really needs to try to get here more often to make more progression. We will continue to monitor his progress.   Theodor has only attended three sessions in December.  Regular attendance is necessary for progression.      Expected Outcomes  Short: Attend more regularly.  Long: Continue to move more at home.   Short - attend class 2-3 days per week Long - improve MET level         Discharge Exercise Prescription (Final Exercise Prescription Changes): Exercise Prescription Changes - 06/25/18 1000      Response to Exercise   Blood Pressure (Admit)  122/78    Blood Pressure (Exit)  118/70    Heart Rate (Admit)  64 bpm    Heart Rate (Exercise)  73 bpm    Heart Rate (Exit)  74 bpm    Oxygen Saturation (Admit)  89 %    Oxygen Saturation (Exercise)  91 %    Oxygen Saturation (Exit)  91 %    Rating of Perceived Exertion (Exercise)  14    Perceived Dyspnea (Exercise)  3    Symptoms  none     Duration  Progress to 45 minutes of aerobic exercise without signs/symptoms of physical distress    Intensity  THRR unchanged      Progression   Progression  Continue to progress workloads to maintain intensity without signs/symptoms of physical distress.    Average METs  1.7      Resistance Training  Training Prescription  Yes    Weight  4 lb    Reps  10-15      Interval Training   Interval Training  No      Treadmill   MPH  0.5    Grade  0    Minutes  15    METs  1.4      Recumbant Elliptical   Level  1    Minutes  15    METs  1.7      T5 Nustep   Level  1    SPM  50    Minutes  15    METs  2.1      Home Exercise Plan   Plans to continue exercise at  Home (comment)   walk, chair exercises   Frequency  Add 1 additional day to program exercise sessions.    Initial Home Exercises Provided  06/03/18       Nutrition:  Target Goals: Understanding of nutrition guidelines, daily intake of sodium <1554m, cholesterol <2097m calories 30% from fat and 7% or less from saturated fats, daily to have 5 or more servings of fruits and vegetables.  Biometrics: Pre Biometrics - 03/17/18 1527      Pre Biometrics   Height  6' 1.5" (1.867 m)    Weight  (!) 313 lb 3.2 oz (142.1 kg)    Waist Circumference  53 inches    Hip Circumference  50 inches    Waist to Hip Ratio  1.06 %    BMI (Calculated)  40.76    Single Leg Stand  0 seconds        Nutrition Therapy Plan and Nutrition Goals: Nutrition Therapy & Goals - 04/13/18 1358      Nutrition Therapy   Diet  DM    Drug/Food Interactions  Statins/Certain Fruits    Protein (specify units)  12-13oz    Fiber  35 grams    Whole Grain Foods  3 servings   eats white-wheat or rye bread   Saturated Fats  16 max. grams    Fruits and Vegetables  6 servings/day   8 ideal; eats more fruits than vegetables   Sodium  2000 grams   does not need to pt reports doctor to tell pt not to worry about monitoring his sodium intake currently      Personal Nutrition Goals   Nutrition Goal  Keep intake of non-nutritious liquid calories IE beer to a minimum. Try to reduce daily consumption from 3-4/day to 1-2/day as an initial goal    Personal Goal #2  Choose evening snacks that are low sugar or zero sugar as opposed to pies and cakes. For example, sugar free pudding or fruit with yogurt    Comments  He has made several changes to his eating practices which has resulted in wt loss of 64# x2 years. BG control has improved, most recent 3 month average 130; HgbA1c 6.7. He has started to eat "lighter" meals and no longer drinks sodas. He has reduced frequency of eating food out of the home from several times per week to zero in the past several months. Drinks water and unsweetened iced tea for beverages, but also typically drinks 3-4 beers per day. Breakfast: oatmeal with fruit and honey, Lunch: apple and cheese, Dinner: soup & sandwich, occasional frozen pizza, beef stew. Typically has a sweet evening snack such as pie but has also tried some sugar free dessert options. Does not eat many vegetables at this  time and does not feel that he eats a lot of meat      Intervention Plan   Intervention  Prescribe, educate and counsel regarding individualized specific dietary modifications aiming towards targeted core components such as weight, hypertension, lipid management, diabetes, heart failure and other comorbidities.    Expected Outcomes  Short Term Goal: Understand basic principles of dietary content, such as calories, fat, sodium, cholesterol and nutrients.;Short Term Goal: A plan has been developed with personal nutrition goals set during dietitian appointment.;Long Term Goal: Adherence to prescribed nutrition plan.       Nutrition Assessments:   Nutrition Goals Re-Evaluation: Nutrition Goals Re-Evaluation    [place] Name 04/13/18 1424 05/13/18 1239           Goals   Nutrition Goal  Keep intake of non-nutritious liquid calories IE beer to a  minimum. Try to reduce daily consumption from 3-4/day to 1-2/day as an initial goal  Keep intake of non-nutritious liquid calories IE beer to a minimum by starting to reduce the number of beers you drink per day; Choose evening snacks that are low or zero sugar more often      Comment  He has removed all other liquid calories such as soda and sweet tea from his diet but continues to drink alcohol daily. He desires to lose another 10-20#  He has lost 10-12# this month, wt loss was desired but amount was unexpeced per pt. He feels that he has had less fluid retention in his BLE which may account for some wt loss in addition to continued dietary interventions. He has continued to work on reducing the number of alcoholic beverages he drinks each week and does not drink other sugar sweetened beverages. He has also been minimizing snack intake, though sometimes still chooses cookies or pie "if it's around" in the evenings. Eating 2 meals/day typically and is including vegetables      Expected Outcome  He will reduce daily alcohol consumption until he is having an alcoholic beverage only sparingly rather than multiple each day  He will monitor wt more closely and be aware of any edema to BLE. He will continue to work on reducing alcohol intake.        Personal Goal #2 Re-Evaluation   Personal Goal #2  Choose evening snacks that are low sugar or zero sugar as opposed to pies and cakes. For example, sugar free pudding or fruit with yogurt  -         Nutrition Goals Discharge (Final Nutrition Goals Re-Evaluation): Nutrition Goals Re-Evaluation - 05/13/18 1239      Goals   Nutrition Goal  Keep intake of non-nutritious liquid calories IE beer to a minimum by starting to reduce the number of beers you drink per day; Choose evening snacks that are low or zero sugar more often    Comment  He has lost 10-12# this month, wt loss was desired but amount was unexpeced per pt. He feels that he has had less fluid retention  in his BLE which may account for some wt loss in addition to continued dietary interventions. He has continued to work on reducing the number of alcoholic beverages he drinks each week and does not drink other sugar sweetened beverages. He has also been minimizing snack intake, though sometimes still chooses cookies or pie "if it's around" in the evenings. Eating 2 meals/day typically and is including vegetables    Expected Outcome  He will monitor wt more closely and be aware  of any edema to BLE. He will continue to work on reducing alcohol intake.       Psychosocial: Target Goals: Acknowledge presence or absence of significant depression and/or stress, maximize coping skills, provide positive support system. Participant is able to verbalize types and ability to use techniques and skills needed for reducing stress and depression.   Initial Review & Psychosocial Screening: Initial Psych Review & Screening - 03/17/18 1520      Initial Review   Current issues with  Current Depression;History of Depression;Current Stress Concerns    Source of Stress Concerns  Chronic Illness;Unable to perform yard/household activities;Financial    Comments  Financial stress and relationship stress. He is unable to do things that he used to do.      Family Dynamics   Good Support System?  Yes    Comments  He can look to his brothers and his fiance. His mother is also supportive for him.      Barriers   Psychosocial barriers to participate in program  The patient should benefit from training in stress management and relaxation.      Screening Interventions   Interventions  Encouraged to exercise;To provide support and resources with identified psychosocial needs;Provide feedback about the scores to participant;Program counselor consult    Expected Outcomes  Short Term goal: Utilizing psychosocial counselor, staff and physician to assist with identification of specific Stressors or current issues interfering with  healing process. Setting desired goal for each stressor or current issue identified.;Long Term Goal: Stressors or current issues are controlled or eliminated.;Short Term goal: Identification and review with participant of any Quality of Life or Depression concerns found by scoring the questionnaire.;Long Term goal: The participant improves quality of Life and PHQ9 Scores as seen by post scores and/or verbalization of changes       Quality of Life Scores:  Scores of 19 and below usually indicate a poorer quality of life in these areas.  A difference of  2-3 points is a clinically meaningful difference.  A difference of 2-3 points in the total score of the Quality of Life Index has been associated with significant improvement in overall quality of life, self-image, physical symptoms, and general health in studies assessing change in quality of life.  PHQ-9: Recent Review Flowsheet Data    Depression screen [facility] 2/9 03/17/2018   Decreased Interest 3   Down, Depressed, Hopeless 2   PHQ - 2 Score 5   Altered sleeping 2   Tired, decreased energy 2   Change in appetite 1   Feeling bad or failure about yourself  2   Trouble concentrating 1   Moving slowly or fidgety/restless 1   Suicidal thoughts 0   PHQ-9 Score 14   Difficult doing work/chores Very difficult     Interpretation of Total Score  Total Score Depression Severity:  1-4 = Minimal depression, 5-9 = Mild depression, 10-14 = Moderate depression, 15-19 = Moderately severe depression, 20-27 = Severe depression   Psychosocial Evaluation and Intervention: Psychosocial Evaluation - 04/13/18 1215      Psychosocial Evaluation & Interventions   Interventions  Stress management education;Relaxation education;Encouraged to exercise with the program and follow exercise prescription;Therapist referral    Comments  Counselor met with Mr. Warshawsky Dolata) today for initial psychsoocial evaluation.  He is a 58 year old who has multiple health issues  including a recent polyp removed from his airway.  [place] has had a trache for the past 9 years and also struggles with  diabetes; neuropathy; GERD; CHF; obesity.  He sleeps well with a ventillator although he has OSA. Duff has a good support system with a partner of 14 years; and a mother who lives locally and brothers who stay in touch by phone.  He has a good appetite.  Taray reports a history of depression and anxiety and is on medications for these currently.  He states he is currently depressed and would like to see a counselor - if possible.  Counselor reviewed the PHQ-9 with Haskel Schroeder score of "14" - which indicated moderate symptoms for depression.  Additionally he has multiple stressors with his health and finances.  He would like to improve his breathing while in this program.  Counselor provided contact information to [facility] for local therapist options to see if they are taking new patients.  Counselor will follow with him about this.     Expected Outcomes  Short:  Jenny will contact a therapist to begin treatment for depressive symptoms.  Saeed will exercise consistently in this program for his health and mental health - particularly positive coping strategies.  Long:  Jaxan will develop a healthy lifestyle routine of exercise, diet and positive coping strategies for his health and mental health.     Continue Psychosocial Services   Follow up required by counselor       Psychosocial Re-Evaluation: Psychosocial Re-Evaluation    [facility] Name 06/03/18 1511             Psychosocial Re-Evaluation   Current issues with  Current Depression;History of Depression;Current Stress Concerns       Comments  Patient states that he sometimes does not make it to therapy because he is to depressed, He states that until now he has been prescribed and anti depressant. He states he has to pick it up today, Informed him that it may be good for him to take it and see if it helps him. He gets beside himself when he thinks about  his health issues.       Expected Outcomes  Short: pick up his anti depressant medication. Long: take anti depressant medication regularly.       Interventions  Encouraged to attend Pulmonary Rehabilitation for the exercise       Continue Psychosocial Services   Follow up required by staff          Psychosocial Discharge (Final Psychosocial Re-Evaluation): Psychosocial Re-Evaluation - 06/03/18 1511      Psychosocial Re-Evaluation   Current issues with  Current Depression;History of Depression;Current Stress Concerns    Comments  Patient states that he sometimes does not make it to therapy because he is to depressed, He states that until now he has been prescribed and anti depressant. He states he has to pick it up today, Informed him that it may be good for him to take it and see if it helps him. He gets beside himself when he thinks about his health issues.    Expected Outcomes  Short: pick up his anti depressant medication. Long: take anti depressant medication regularly.    Interventions  Encouraged to attend Pulmonary Rehabilitation for the exercise    Continue Psychosocial Services   Follow up required by staff       Education: Education Goals: Education classes will be provided on a weekly basis, covering required topics. Participant will state understanding/return demonstration of topics presented.  Learning Barriers/Preferences: Learning Barriers/Preferences - 03/17/18 1525      Learning Barriers/Preferences   Learning Barriers  None    Learning Preferences  None       Education Topics:  Initial Evaluation Education: - Verbal, written and demonstration of respiratory meds, oximetry and breathing techniques. Instruction on use of nebulizers and MDIs and importance of monitoring MDI activations.   Pulmonary Rehab from 06/10/2018 in [facility] Cardiac and Pulmonary Rehab  Date  03/17/18  Educator  [facility]  Instruction Review Code  1- Verbalizes Understanding      General Nutrition  Guidelines/Fats and Fiber: -Group instruction provided by verbal, written material, models and posters to present the general guidelines for heart healthy nutrition. Gives an explanation and review of dietary fats and fiber.   Controlling Sodium/Reading Food Labels: -Group verbal and written material supporting the discussion of sodium use in heart healthy nutrition. Review and explanation with models, verbal and written materials for utilization of the food label.   Pulmonary Rehab from 06/10/2018 in [facility] Cardiac and Pulmonary Rehab  Date  05/13/18  Educator  [facility]  Instruction Review Code  1- Verbalizes Understanding      Exercise Physiology & General Exercise Guidelines: - Group verbal and written instruction with models to review the exercise physiology of the cardiovascular system and associated critical values. Provides general exercise guidelines with specific guidelines to those with heart or lung disease.    Aerobic Exercise & Resistance Training: - Gives group verbal and written instruction on the various components of exercise. Focuses on aerobic and resistive training programs and the benefits of this training and how to safely progress through these programs.   Flexibility, Balance, Mind/Body Relaxation: Provides group verbal/written instruction on the benefits of flexibility and balance training, including mind/body exercise modes such as yoga, pilates and tai chi.  Demonstration and skill practice provided.   Pulmonary Rehab from 06/10/2018 in [facility] Cardiac and Pulmonary Rehab  Date  04/15/18  Educator  [facility]  Instruction Review Code  1- Verbalizes Understanding      Stress and Anxiety: - Provides group verbal and written instruction about the health risks of elevated stress and causes of high stress.  Discuss the correlation between heart/lung disease and anxiety and treatment options. Review healthy ways to manage with stress and anxiety.   Depression: - Provides group  verbal and written instruction on the correlation between heart/lung disease and depressed mood, treatment options, and the stigmas associated with seeking treatment.   Exercise & Equipment Safety: - Individual verbal instruction and demonstration of equipment use and safety with use of the equipment.   Pulmonary Rehab from 06/10/2018 in [facility] Cardiac and Pulmonary Rehab  Date  03/17/18  Educator  [facility]  Instruction Review Code  1- Verbalizes Understanding      Infection Prevention: - Provides verbal and written material to individual with discussion of infection control including proper hand washing and proper equipment cleaning during exercise session.   Pulmonary Rehab from 06/10/2018 in [facility] Cardiac and Pulmonary Rehab  Date  03/17/18  Educator  [facility]  Instruction Review Code  1- Verbalizes Understanding      Falls Prevention: - Provides verbal and written material to individual with discussion of falls prevention and safety.   Pulmonary Rehab from 06/10/2018 in [facility] Cardiac and Pulmonary Rehab  Date  03/17/18  Educator  [facility]  Instruction Review Code  1- Verbalizes Understanding      Diabetes: - Individual verbal and written instruction to review signs/symptoms of diabetes, desired ranges of glucose level fasting, after meals and with exercise. Advice that pre and post exercise glucose checks  will be done for 3 sessions at entry of program.   Chronic Lung Diseases: - Group verbal and written instruction to review updates, respiratory medications, advancements in procedures and treatments. Discuss use of supplemental oxygen including available portable oxygen systems, continuous and intermittent flow rates, concentrators, personal use and safety guidelines. Review proper use of inhaler and spacers. Provide informative websites for self-education.    Energy Conservation: - Provide group verbal and written instruction for methods to conserve energy, plan and organize activities.  Instruct on pacing techniques, use of adaptive equipment and posture/positioning to relieve shortness of breath.   Triggers and Exacerbations: - Group verbal and written instruction to review types of environmental triggers and ways to prevent exacerbations. Discuss weather changes, air quality and the benefits of nasal washing. Review warning signs and symptoms to help prevent infections. Discuss techniques for effective airway clearance, coughing, and vibrations.   AED/CPR: - Group verbal and written instruction with the use of models to demonstrate the basic use of the AED with the basic ABC's of resuscitation.   Anatomy and Physiology of the Lungs: - Group verbal and written instruction with the use of models to provide basic lung anatomy and physiology related to function, structure and complications of lung disease.   Anatomy & Physiology of the Heart: - Group verbal and written instruction and models provide basic cardiac anatomy and physiology, with the coronary electrical and arterial systems. Review of Valvular disease and Heart Failure   Cardiac Medications: - Group verbal and written instruction to review commonly prescribed medications for heart disease. Reviews the medication, class of the drug, and side effects.   Know Your Numbers and Risk Factors: -Group verbal and written instruction about important numbers in your health.  Discussion of what are risk factors and how they play a role in the disease process.  Review of Cholesterol, Blood Pressure, Diabetes, and BMI and the role they play in your overall health.   Pulmonary Rehab from 06/10/2018 in [facility] Cardiac and Pulmonary Rehab  Date  06/10/18  Educator  [facility]  Instruction Review Code  1- Verbalizes Understanding      Sleep Hygiene: -Provides group verbal and written instruction about how sleep can affect your health.  Define sleep hygiene, discuss sleep cycles and impact of sleep habits. Review good sleep hygiene  tips.    Pulmonary Rehab from 06/10/2018 in [facility] Cardiac and Pulmonary Rehab  Date  06/03/18  Educator  [facility]  Instruction Review Code  1- Verbalizes Understanding      Other: -Provides group and verbal instruction on various topics (see comments)    Knowledge Questionnaire Score: Knowledge Questionnaire Score - 03/17/18 1452      Knowledge Questionnaire Score   Pre Score  16/18   REVIEWED WITH patient       Core Components/Risk Factors/Patient Goals at Admission: Personal Goals and Risk Factors at Admission - 03/17/18 1525      Core Components/Risk Factors/Patient Goals on Admission    Weight Management  Weight Loss    Intervention  Weight Management: Develop a combined nutrition and exercise program designed to reach desired caloric intake, while maintaining appropriate intake of nutrient and fiber, sodium and fats, and appropriate energy expenditure required for the weight goal.;Weight Management/Obesity: Establish reasonable short term and long term weight goals.;Weight Management: Provide education and appropriate resources to help participant work on and attain dietary goals.    Admit Weight  313 lb 3.2 oz (142.1 kg)    Goal Weight: Short Term  308 lb (139.7 kg)    Goal Weight: Long Term  290 lb (131.5 kg)    Expected Outcomes  Short Term: Continue to assess and modify interventions until short term weight is achieved;Long Term: Adherence to nutrition and physical activity/exercise program aimed toward attainment of established weight goal;Understanding recommendations for meals to include 15-35% energy as protein, 25-35% energy from fat, 35-60% energy from carbohydrates, less than 224m of dietary cholesterol, 20-35 gm of total fiber daily;Understanding of distribution of calorie intake throughout the day with the consumption of 4-5 meals/snacks;Weight Loss: Understanding of general recommendations for a balanced deficit meal plan, which promotes 1-2 lb weight loss per week and  includes a negative energy balance of [phone] kcal/d    Improve shortness of breath with ADL's  Yes    Intervention  Provide education, individualized exercise plan and daily activity instruction to help decrease symptoms of SOB with activities of daily living.    Expected Outcomes  Short Term: Improve cardiorespiratory fitness to achieve a reduction of symptoms when performing ADLs;Long Term: Be able to perform more ADLs without symptoms or delay the onset of symptoms    Diabetes  Yes    Intervention  Provide education about signs/symptoms and action to take for hypo/hyperglycemia.;Provide education about proper nutrition, including hydration, and aerobic/resistive exercise prescription along with prescribed medications to achieve blood glucose in normal ranges: Fasting glucose 65-99 mg/dL    Expected Outcomes  Short Term: Participant verbalizes understanding of the signs/symptoms and immediate care of hyper/hypoglycemia, proper foot care and importance of medication, aerobic/resistive exercise and nutrition plan for blood glucose control.;Long Term: Attainment of HbA1C < 7%.   his A1c was 6.5 he states   Heart Failure  Yes   pace maker   Intervention  Provide a combined exercise and nutrition program that is supplemented with education, support and counseling about heart failure. Directed toward relieving symptoms such as shortness of breath, decreased exercise tolerance, and extremity edema.    Expected Outcomes  Improve functional capacity of life;Short term: Attendance in program 2-3 days a week with increased exercise capacity. Reported lower sodium intake. Reported increased fruit and vegetable intake. Reports medication compliance.;Short term: Daily weights obtained and reported for increase. Utilizing diuretic protocols set by physician.;Long term: Adoption of self-care skills and reduction of barriers for early signs and symptoms recognition and intervention leading to self-care maintenance.     Hypertension  Yes    Intervention  Provide education on lifestyle modifcations including regular physical activity/exercise, weight management, moderate sodium restriction and increased consumption of fresh fruit, vegetables, and low fat dairy, alcohol moderation, and smoking cessation.;Monitor prescription use compliance.    Expected Outcomes  Short Term: Continued assessment and intervention until BP is < 140/933mHG in hypertensive participants. < 130/8073mG in hypertensive participants with diabetes, heart failure or chronic kidney disease.;Long Term: Maintenance of blood pressure at goal levels.    Lipids  Yes    Intervention  Provide education and support for participant on nutrition & aerobic/resistive exercise along with prescribed medications to achieve LDL <100m95mDL >40mg33m Expected Outcomes  Short Term: Participant states understanding of desired cholesterol values and is compliant with medications prescribed. Participant is following exercise prescription and nutrition guidelines.;Long Term: Cholesterol controlled with medications as prescribed, with individualized exercise RX and with personalized nutrition plan. Value goals: LDL < 100mg,74m > 40 mg.       Core Components/Risk Factors/Patient Goals Review:  Goals and Risk Factor Review  [place] Name 06/03/18 1515             Core Components/Risk Factors/Patient Goals Review   Personal Goals Review  Weight Management/Obesity;Improve shortness of breath with ADL's;Heart Failure;Diabetes;Lipids       Review  Jourdan's attendance has been sporadic since he suffers from depression. He states that after he exercises he feels better. Informed him that he has to hold on to that feeling he gets when he exercises and continue moving forward.       Expected Outcomes  Short: Attend [facility] regularly to improve shortness of breath with ADL's. Long: maintain independence with ADL's           Core Components/Risk Factors/Patient Goals at  Discharge (Final Review):  Goals and Risk Factor Review - 06/03/18 1515      Core Components/Risk Factors/Patient Goals Review   Personal Goals Review  Weight Management/Obesity;Improve shortness of breath with ADL's;Heart Failure;Diabetes;Lipids    Review  Orestes's attendance has been sporadic since he suffers from depression. He states that after he exercises he feels better. Informed him that he has to hold on to that feeling he gets when he exercises and continue moving forward.    Expected Outcomes  Short: Attend [facility] regularly to improve shortness of breath with ADL's. Long: maintain independence with ADL's        ITP Comments: ITP Comments    [place] Name 03/17/18 1446 04/13/18 0855 04/28/18 1509 05/11/18 0841 05/13/18 1127   ITP Comments  Medical Evaluation completed. Chart sent for review and changes to Dr. Emily Filbert Director of [place]. Diagnosis can be found in [facility] encounter 02/27/18  30 day review completed. ITP sent to Dr. Emily Filbert Director of [place]. Continue with ITP unless changes are made by physician.  Called to check on pt.  He has been out since 04/15/18.  He said that last week he was out sick and that on Monday he couldn't get a ride scheduled.  He has a ride already set up for tomorrow and hopes to be here then.    30 day review completed. ITP sent to Dr. Emily Filbert Director of [place]. Continue with ITP unless changes are made by physician.  Lewi returned today after being out for almost a month   [place] Name 06/08/18 0843 07/06/18 0843         ITP Comments  30 day review completed. ITP sent to Dr. Emily Filbert Director of [place]. Continue with ITP unless changes are made by physician.  Patient has not been to [place] to obtain goals. 30 day review completed. ITP sent to Dr. Emily Filbert Director of [place]. Continue with ITP unless changes are made by physician.         Comments: 30 day review

## 2018-07-08 ENCOUNTER — Encounter: Payer: Medicare HMO | Admitting: *Deleted

## 2018-07-08 DIAGNOSIS — Z79899 Other long term (current) drug therapy: Secondary | ICD-10-CM | POA: Diagnosis not present

## 2018-07-08 DIAGNOSIS — J9610 Chronic respiratory failure, unspecified whether with hypoxia or hypercapnia: Secondary | ICD-10-CM | POA: Diagnosis present

## 2018-07-08 DIAGNOSIS — E785 Hyperlipidemia, unspecified: Secondary | ICD-10-CM | POA: Diagnosis not present

## 2018-07-08 DIAGNOSIS — Z794 Long term (current) use of insulin: Secondary | ICD-10-CM | POA: Diagnosis not present

## 2018-07-08 DIAGNOSIS — I252 Old myocardial infarction: Secondary | ICD-10-CM | POA: Diagnosis not present

## 2018-07-08 DIAGNOSIS — Z7901 Long term (current) use of anticoagulants: Secondary | ICD-10-CM | POA: Diagnosis not present

## 2018-07-08 DIAGNOSIS — K219 Gastro-esophageal reflux disease without esophagitis: Secondary | ICD-10-CM | POA: Diagnosis not present

## 2018-07-08 DIAGNOSIS — Z7951 Long term (current) use of inhaled steroids: Secondary | ICD-10-CM | POA: Diagnosis not present

## 2018-07-08 DIAGNOSIS — I110 Hypertensive heart disease with heart failure: Secondary | ICD-10-CM | POA: Diagnosis not present

## 2018-07-08 DIAGNOSIS — E1142 Type 2 diabetes mellitus with diabetic polyneuropathy: Secondary | ICD-10-CM | POA: Diagnosis not present

## 2018-07-08 DIAGNOSIS — G4733 Obstructive sleep apnea (adult) (pediatric): Secondary | ICD-10-CM | POA: Diagnosis not present

## 2018-07-08 DIAGNOSIS — Z86711 Personal history of pulmonary embolism: Secondary | ICD-10-CM | POA: Diagnosis not present

## 2018-07-08 DIAGNOSIS — I509 Heart failure, unspecified: Secondary | ICD-10-CM | POA: Diagnosis not present

## 2018-07-08 DIAGNOSIS — N289 Disorder of kidney and ureter, unspecified: Secondary | ICD-10-CM | POA: Diagnosis not present

## 2018-07-08 NOTE — Progress Notes (Signed)
Daily Session Note  Patient Details  Name: [name] MRN: [id number] Date of Birth: [birth year] Referring Provider:     Pulmonary Rehab from 03/17/2018 in [facility]  Referring Provider  [name] MD      Encounter Date: 07/08/2018  Check In: Session Check In - 07/08/18 1111      Check-In   Supervising physician immediately available to respond to emergencies  [facility] immediately available ER MD    Physician(s)  Dr. Joni Fears and Jimmye Norman    Location  [facility]-Cardiac & Pulmonary Rehab    Staff Present  Alberteen Sam, MA, RCEP, CCRP, Exercise Physiologist;Joseph Alcus Dad, RN BSN    Medication changes reported      No    Fall or balance concerns reported     No    Warm-up and Cool-down  Performed as group-led Higher education careers adviser Performed  Yes    VAD Patient?  No    PAD/SET Patient?  No      Pain Assessment   Currently in Pain?  No/denies          Social History   Tobacco Use  Smoking Status Never Smoker  Smokeless Tobacco Never Used    Goals Met:  Proper associated with RPD/PD & O2 Sat Independence with exercise equipment Using PLB without cueing & demonstrates good technique Exercise tolerated well No report of cardiac concerns or symptoms Strength training completed today  Goals Unmet:  Not Applicable  Comments: Pt able to follow exercise prescription today without complaint.  Will continue to monitor for progression.    Dr. Emily Filbert is Medical Director for [place] and [facility].

## 2018-07-21 ENCOUNTER — Encounter: Payer: Self-pay | Admitting: *Deleted

## 2018-07-21 ENCOUNTER — Telehealth: Payer: Self-pay

## 2018-07-21 DIAGNOSIS — J9610 Chronic respiratory failure, unspecified whether with hypoxia or hypercapnia: Secondary | ICD-10-CM

## 2018-07-21 NOTE — Telephone Encounter (Signed)
Patient has not been in [facility] since 07/08/2018. Left message for patient.

## 2018-07-31 ENCOUNTER — Encounter: Payer: Medicare HMO | Attending: Internal Medicine | Admitting: *Deleted

## 2018-07-31 DIAGNOSIS — E1142 Type 2 diabetes mellitus with diabetic polyneuropathy: Secondary | ICD-10-CM | POA: Diagnosis not present

## 2018-07-31 DIAGNOSIS — N289 Disorder of kidney and ureter, unspecified: Secondary | ICD-10-CM | POA: Diagnosis not present

## 2018-07-31 DIAGNOSIS — I509 Heart failure, unspecified: Secondary | ICD-10-CM | POA: Insufficient documentation

## 2018-07-31 DIAGNOSIS — I110 Hypertensive heart disease with heart failure: Secondary | ICD-10-CM | POA: Insufficient documentation

## 2018-07-31 DIAGNOSIS — Z79899 Other long term (current) drug therapy: Secondary | ICD-10-CM | POA: Insufficient documentation

## 2018-07-31 DIAGNOSIS — Z86711 Personal history of pulmonary embolism: Secondary | ICD-10-CM | POA: Insufficient documentation

## 2018-07-31 DIAGNOSIS — Z794 Long term (current) use of insulin: Secondary | ICD-10-CM | POA: Diagnosis not present

## 2018-07-31 DIAGNOSIS — E785 Hyperlipidemia, unspecified: Secondary | ICD-10-CM | POA: Insufficient documentation

## 2018-07-31 DIAGNOSIS — G4733 Obstructive sleep apnea (adult) (pediatric): Secondary | ICD-10-CM | POA: Diagnosis not present

## 2018-07-31 DIAGNOSIS — Z7951 Long term (current) use of inhaled steroids: Secondary | ICD-10-CM | POA: Insufficient documentation

## 2018-07-31 DIAGNOSIS — J9610 Chronic respiratory failure, unspecified whether with hypoxia or hypercapnia: Secondary | ICD-10-CM | POA: Insufficient documentation

## 2018-07-31 DIAGNOSIS — K219 Gastro-esophageal reflux disease without esophagitis: Secondary | ICD-10-CM | POA: Insufficient documentation

## 2018-07-31 DIAGNOSIS — I252 Old myocardial infarction: Secondary | ICD-10-CM | POA: Diagnosis not present

## 2018-07-31 DIAGNOSIS — Z7901 Long term (current) use of anticoagulants: Secondary | ICD-10-CM | POA: Diagnosis not present

## 2018-07-31 NOTE — Progress Notes (Signed)
Daily Session Note  Patient Details  Name: [name] MRN: [id number] Date of Birth: [birth year] Referring Provider:     Pulmonary Rehab from 03/17/2018 in [facility] Pulmonary Rehab  Referring Provider  [name] MD      Encounter Date: 07/31/2018  Check In: Session Check In - 07/31/18 1133      Check-In   Supervising physician immediately available to respond to emergencies  [facility] immediately available ER MD    Physician(s)  Drs. Filiberto Pinks    Location  [facility]-Cardiac & Pulmonary Rehab    Staff Present  Renita Papa, RN BSN;Taiten Brawn Luan [place], [place], RCEP, CCRP, Exercise Physiologist;Joseph Tessie Fass RCP,RRT,BSRT    Medication changes reported      No    Fall or balance concerns reported     No    Warm-up and Cool-down  Performed as group-led instruction    Resistance Training Performed  Yes    VAD Patient?  No    PAD/SET Patient?  No      Pain Assessment   Currently in Pain?  No/denies          Social History   Tobacco Use  Smoking Status Never Smoker  Smokeless Tobacco Never Used    Goals Met:  Proper associated with RPD/PD & O2 Sat Independence with exercise equipment Using PLB without cueing & demonstrates good technique Exercise tolerated well No report of cardiac concerns or symptoms Strength training completed today  Goals Unmet:  Not Applicable  Comments: Pt able to follow exercise prescription today without complaint.  Will continue to monitor for progression.    Dr. Emily Filbert is Medical Director for [place] and [facility].

## 2018-08-03 DIAGNOSIS — J9610 Chronic respiratory failure, unspecified whether with hypoxia or hypercapnia: Secondary | ICD-10-CM

## 2018-08-03 NOTE — Progress Notes (Signed)
Pulmonary Individual Treatment Plan  Patient Details   Name: [name] MRN: [id number] Date of Birth: March 08, 1961 Referring Provider:     Pulmonary Rehab from 03/17/2018 in [facility]  Referring Provider  [name] MD      Initial Encounter Date:    Pulmonary Rehab from 03/17/2018 in [facility]  Date  03/17/18      Visit Diagnosis: Chronic respiratory failure, unspecified whether with hypoxia or hypercapnia ([place])  Patient's Home Medications on Admission:  Current Outpatient Medications:  .  acetaminophen (TYLENOL) 325 MG tablet, Take 1 tablet (325 mg total) by mouth every 6 (six) hours as needed for mild pain (or Fever >/= 101)., Disp: , Rfl:  .  ALPRAZolam (XANAX) 0.5 MG tablet, Take 1 tablet (0.5 mg total) by mouth every 8 (eight) hours as needed for anxiety., Disp: 20 tablet, Rfl: 0 .  AMBULATORY NON FORMULARY MEDICATION, Medication Name: Trachea collar for use with nebulizer DX: J96.10, Disp: 1 each, Rfl: 0 .  AMBULATORY NON FORMULARY MEDICATION, Medication Name: Nebulizer DX: J96.10, Disp: 1 each, Rfl: 0 .  amiodarone (PACERONE) 200 MG tablet, Take 200 mg by mouth 2 (two) times daily., Disp: , Rfl: 8 .  atorvastatin (LIPITOR) 40 MG tablet, Take 1 tablet (40 mg total) by mouth daily at 6 PM., Disp: 30 tablet, Rfl: 1 .  atorvastatin (LIPITOR) 80 MG tablet, Take 80 mg by mouth at bedtime., Disp: , Rfl:  .  budesonide (PULMICORT) 0.5 MG/2ML nebulizer solution, Take 2 mLs (0.5 mg total) by nebulization 2 (two) times daily., Disp: 75 mL, Rfl: 12 .  busPIRone (BUSPAR) 10 MG tablet, Take 10 mg by mouth 2 (two) times daily as needed. , Disp: , Rfl:  .  collagenase (SANTYL) ointment, Apply topically daily., Disp: 15 g, Rfl: 0 .  enalapril (VASOTEC) 2.5 MG tablet, Take 1 tablet (2.5 mg total) by mouth daily., Disp: 30 tablet, Rfl: 0 .  feeding supplement, GLUCERNA SHAKE, (GLUCERNA SHAKE) LIQD, Take 237 mLs by mouth 2 (two) times  daily between meals., Disp: 60 Can, Rfl: 0 .  folic acid (FOLVITE) 1 MG tablet, Take 1 tablet (1 mg total) by mouth daily., Disp: 30 tablet, Rfl: 0 .  furosemide (LASIX) 40 MG tablet, Take 1 tablet (40 mg total) by mouth daily., Disp: 30 tablet, Rfl: 0 .  gabapentin (NEURONTIN) 800 MG tablet, Take 1 tablet (800 mg total) by mouth 3 (three) times daily. 1 tablet twice daily (Patient taking differently: Take 1,600 mg by mouth 2 (two) times daily. ), Disp: 60 tablet, Rfl: 0 .  insulin detemir (LEVEMIR) 100 unit/ml SOLN, Inject 20 Units into the skin 2 (two) times daily. , Disp: , Rfl:  .  insulin lispro (HUMALOG) 100 UNIT/ML injection, Inject 2-10 Units into the skin 3 (three) times daily with meals. , Disp: , Rfl:  .  ipratropium-albuterol (DUONEB) 0.5-2.5 (3) MG/3ML SOLN, Take 3 mLs by nebulization 3 (three) times daily., Disp: 360 mL, Rfl: 1 .  lamoTRIgine (LAMICTAL) 150 MG tablet, Take 150 mg by mouth 2 (two) times daily. , Disp: , Rfl:  .  metFORMIN (GLUCOPHAGE-XR) 500 MG 24 hr tablet, Take 1,000 mg by mouth 2 (two) times daily. , Disp: , Rfl:  .  metoprolol tartrate 75 MG TABS, Take 75 mg by mouth 2 (two) times daily. 100 mg twice daily, Disp: 60 tablet, Rfl: 0 .  Multiple Vitamin (MULTIVITAMIN WITH MINERALS) TABS tablet, Take 1 tablet by mouth daily.,  Disp: , Rfl:  .  omeprazole (PRILOSEC) 20 MG capsule, Take 20 mg by mouth daily. Once daily, Disp: , Rfl:  .  oxyCODONE-acetaminophen (PERCOCET) 10-325 MG tablet, Take 1 tablet by mouth 4 (four) times daily as needed for pain. , Disp: , Rfl:  .  polyethylene glycol (MIRALAX / GLYCOLAX) packet, Take 17 g by mouth daily as needed for mild constipation., Disp: 14 each, Rfl: 0 .  rivaroxaban (XARELTO) 20 MG TABS tablet, Take 20 mg by mouth daily., Disp: , Rfl:  .  senna-docusate (SENOKOT-S) 8.6-50 MG tablet, Take 2 tablets by mouth at bedtime as needed for mild constipation., Disp: , Rfl:  .  thiamine 100 MG tablet, Take 1 tablet (100 mg total) by mouth  daily., Disp: 30 tablet, Rfl: 0  Past Medical History: Past Medical History:  Diagnosis Date  . CHF (congestive heart failure) ([place])   . Diabetes mellitus ([place])   . Diabetic peripheral neuropathy ([place])   . Elevated liver enzymes    fatty liver per [facility]  . GERD (gastroesophageal reflux disease)   . History of pulmonary embolus (PE)   . Hyperlipidemia   . Hypertension   . Myocardial infarct ([place])   . OSA (obstructive sleep apnea)   . 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 Renal insufficiency     Tobacco Use: Social History   Tobacco Use  Smoking Status Never Smoker  Smokeless Tobacco Never Used    Labs: Recent Review Flowsheet Data    Labs for ITP Cardiac and Pulmonary Rehab Latest Ref Rng & Units 07/17/2017 10/08/2017 10/24/2017   Cholestrol 0 - 200 mg/dL 125 - -   LDLCALC 0 - 99 mg/dL 53 - -   HDL >40 mg/dL 56 - -   Trlycerides <150 mg/dL 82 - -   Hemoglobin A1c 4.8 - 5.6 % 6.1(H) 5.7(H) -   HCO3 20.0 - 28.0 mmol/L - - 32.3(H)   O2SAT % - - 80.2       Pulmonary Assessment Scores: Pulmonary Assessment Scores    Row Name 03/17/18 1451         ADL UCSD   ADL Phase  Entry     SOB Score total  63     Rest  2     Walk  2     Stairs  4     Bath  1     Dress  1     Shop  3       CAT Score   CAT Score  25       mMRC Score   mMRC Score  2        Pulmonary Function Assessment: Pulmonary Function Assessment - 03/17/18 1519      Breath   Bilateral Breath Sounds  Clear    Shortness of Breath  Limiting activity;Yes;Panic with Shortness of Breath       Exercise Target Goals: Exercise Program Goal: Individual exercise prescription set using results from initial 6 min walk test and THRR while considering  patient's activity barriers and safety.   Exercise Prescription Goal: Initial exercise prescription builds to 30-45 minutes a day of aerobic activity, 2-3 days per week.  Home exercise guidelines will be given to  patient during program as part of exercise prescription that the participant will acknowledge.  Activity Barriers & Risk Stratification: Activity Barriers & Cardiac Risk Stratification - 03/17/18 1522      Activity Barriers &  Cardiac Risk Stratification   Activity Barriers  Muscular Weakness;Deconditioning;Balance Concerns;History of Falls;Assistive Device;Shortness of Breath;Back Problems;Decreased Ventricular Function   occasional back spasm      6 Minute Walk: 6 Minute Walk    Row Name 03/17/18 1520         6 Minute Walk   Phase  Initial     Distance  525 feet used rollator     Walk Time  6 minutes     # of Rest Breaks  0     MPH  0.99     METS  1.65     RPE  12     Perceived Dyspnea   1     VO2 Peak  5.78     Symptoms  Yes (comment)     Comments  arms got tired, legs got tight, slightly dizzy headed     Resting HR  61 bpm     Resting BP  124/72     Resting Oxygen Saturation   92 %     Exercise Oxygen Saturation  during 6 min walk  80 %     Max Ex. HR  100 bpm     Max Ex. BP  146/64     2 Minute Post BP  136/72       Interval HR   1 Minute HR  75     2 Minute HR  80     3 Minute HR  91     4 Minute HR  98     5 Minute HR  100     6 Minute HR  90     2 Minute Post HR  60     Interval Heart Rate?  Yes       Interval Oxygen   Interval Oxygen?  Yes     Baseline Oxygen Saturation %  92 %     1 Minute Oxygen Saturation %  83 %     1 Minute Liters of Oxygen  0 L Room Air     2 Minute Oxygen Saturation %  82 %     2 Minute Liters of Oxygen  0 L     3 Minute Oxygen Saturation %  82 %     3 Minute Liters of Oxygen  0 L     4 Minute Oxygen Saturation %  82 %     4 Minute Liters of Oxygen  0 L     5 Minute Oxygen Saturation %  80 %     5 Minute Liters of Oxygen  0 L     6 Minute Oxygen Saturation %  82 %     6 Minute Liters of Oxygen  0 L     2 Minute Post Oxygen Saturation %  85 %     2 Minute Post Liters of Oxygen  0 L       Oxygen Initial  Assessment: Oxygen Initial Assessment - 03/17/18 1518      Home Oxygen   Home Oxygen Device  Home Concentrator    Sleep Oxygen Prescription  Continuous    Liters per minute  3.5    Home Exercise Oxygen Prescription  None    Home at Rest Exercise Oxygen Prescription  None    Compliance with Home Oxygen Use  Yes      Initial 6 min Walk   Oxygen Used  None      Program Oxygen Prescription   Program Oxygen  Prescription  None      Intervention   Short Term Goals  To learn and exhibit compliance with exercise, home and travel O2 prescription;To learn and understand importance of monitoring SPO2 with pulse oximeter and demonstrate accurate use of the pulse oximeter.;To learn and understand importance of maintaining oxygen saturations>88%;To learn and demonstrate proper pursed lip breathing techniques or other breathing techniques.;To learn and demonstrate proper use of respiratory medications    Long  Term Goals  Exhibits compliance with exercise, home and travel O2 prescription;Verbalizes importance of monitoring SPO2 with pulse oximeter and return demonstration;Maintenance of O2 saturations>88%;Exhibits proper breathing techniques, such as pursed lip breathing or other method taught during program session;Compliance with respiratory medication;Demonstrates proper use of MDI's       Oxygen Re-Evaluation: Oxygen Re-Evaluation    Row Name 06/03/18 1504 07/31/18 1219           Program Oxygen Prescription   Program Oxygen Prescription  None  None        Home Oxygen   Home Oxygen Device  Home Concentrator  Home Concentrator      Sleep Oxygen Prescription  Continuous  Continuous      Liters per minute  3.5  3.5      Home Exercise Oxygen Prescription  None  None      Home at Rest Exercise Oxygen Prescription  None  None      Compliance with Home Oxygen Use  Yes  Yes        Goals/Expected Outcomes   Short Term Goals  To learn and exhibit compliance with exercise, home and travel O2  prescription;To learn and understand importance of monitoring SPO2 with pulse oximeter and demonstrate accurate use of the pulse oximeter.;To learn and understand importance of maintaining oxygen saturations>88%;To learn and demonstrate proper pursed lip breathing techniques or other breathing techniques.;To learn and demonstrate proper use of respiratory medications  To learn and exhibit compliance with exercise, home and travel O2 prescription;To learn and understand importance of monitoring SPO2 with pulse oximeter and demonstrate accurate use of the pulse oximeter.;To learn and understand importance of maintaining oxygen saturations>88%;To learn and demonstrate proper pursed lip breathing techniques or other breathing techniques.;To learn and demonstrate proper use of respiratory medications      Long  Term Goals  Exhibits compliance with exercise, home and travel O2 prescription;Verbalizes importance of monitoring SPO2 with pulse oximeter and return demonstration;Maintenance of O2 saturations>88%;Exhibits proper breathing techniques, such as pursed lip breathing or other method taught during program session;Compliance with respiratory medication;Demonstrates proper use of MDI's  Exhibits compliance with exercise, home and travel O2 prescription;Verbalizes importance of monitoring SPO2 with pulse oximeter and return demonstration;Maintenance of O2 saturations>88%;Exhibits proper breathing techniques, such as pursed lip breathing or other method taught during program session;Compliance with respiratory medication;Demonstrates proper use of MDI's      Comments  Patient has a pulse oximeter at home and is to check his oxygen when he is exerting himself. Patient has a trach and is to practice at deep and diphragmatic breathing.  Patient has been monitoring his oxygen at home. He is sleeping with his oxygen everynight. His oxygen is between 90-96 percent at home.      Goals/Expected Outcomes  Short: monitor  oxygen at home with exertion. Long: maintain oxygen saturations above 88 percent independently.  Short: attend [facility] regulary. Long: maintain exercise independently to imrpove shortness of breath.         Oxygen Discharge (Final Oxygen Re-Evaluation): Oxygen Re-Evaluation -  07/31/18 1219      Program Oxygen Prescription   Program Oxygen Prescription  None      Home Oxygen   Home Oxygen Device  Home Concentrator    Sleep Oxygen Prescription  Continuous    Liters per minute  3.5    Home Exercise Oxygen Prescription  None    Home at Rest Exercise Oxygen Prescription  None    Compliance with Home Oxygen Use  Yes      Goals/Expected Outcomes   Short Term Goals  To learn and exhibit compliance with exercise, home and travel O2 prescription;To learn and understand importance of monitoring SPO2 with pulse oximeter and demonstrate accurate use of the pulse oximeter.;To learn and understand importance of maintaining oxygen saturations>88%;To learn and demonstrate proper pursed lip breathing techniques or other breathing techniques.;To learn and demonstrate proper use of respiratory medications    Long  Term Goals  Exhibits compliance with exercise, home and travel O2 prescription;Verbalizes importance of monitoring SPO2 with pulse oximeter and return demonstration;Maintenance of O2 saturations>88%;Exhibits proper breathing techniques, such as pursed lip breathing or other method taught during program session;Compliance with respiratory medication;Demonstrates proper use of MDI's    Comments  Patient has been monitoring his oxygen at home. He is sleeping with his oxygen everynight. His oxygen is between 90-96 percent at home.    Goals/Expected Outcomes  Short: attend [facility] regulary. Long: maintain exercise independently to imrpove shortness of breath.       Initial Exercise Prescription: Initial Exercise Prescription - 03/17/18 1500      Date of Initial Exercise RX and Referring Provider    Date  03/17/18    Referring Provider  [name] MD      Oxygen   Oxygen  Continuous    Liters  2      Treadmill   MPH  0.8    Grade  0    Minutes  15    METs  1.6      Recumbant Elliptical   Level  1    RPM  50    Minutes  15    METs  1.6      T5 Nustep   Level  1    SPM  80    Minutes  15    METs  1.6      Prescription Details   Frequency (times per week)  3    Duration  Progress to 45 minutes of aerobic exercise without signs/symptoms of physical distress      Intensity   THRR 40-80% of Max Heartrate  102-144    Ratings of Perceived Exertion  11-13    Perceived Dyspnea  0-4      Progression   Progression  Continue to progress workloads to maintain intensity without signs/symptoms of physical distress.      Resistance Training   Training Prescription  Yes    Weight  4 lbs    Reps  10-15       Perform Capillary Blood Glucose checks as needed.  Exercise Prescription Changes: Exercise Prescription Changes    Row Name 03/17/18 1500 04/14/18 1500 04/28/18 1500 05/25/18 1600 06/03/18 1400     Response to Exercise   Blood Pressure (Admit)  124/72  130/62  128/62  124/74  -   Blood Pressure (Exercise)  146/64  138/76  140/62  -  -   Blood Pressure (Exit)  136/72  138/82  126/64  144/78  -   Heart Rate (Admit)  61 bpm  54 bpm  65 bpm  57 bpm  -   Heart Rate (Exercise)  100 bpm  78 bpm  89 bpm  131 bpm  -   Heart Rate (Exit)  60 bpm  53 bpm  62 bpm  64 bpm  -   Oxygen Saturation (Admit)  92 %  92 %  93 %  95 %  -   Oxygen Saturation (Exercise)  80 %  90 %  93 %  87 %  -   Oxygen Saturation (Exit)  85 %  93 %  98 %  93 %  -   Rating of Perceived Exertion (Exercise)  _0 -   Perceived Dyspnea (Exercise)  _1 -   Symptoms  SOB, arms and legs tired and tight, slightly dizzy  SOB  SOB  SOB  -   Comments  walk test results  first full day of exercise  second full day of exercise  -  -   Duration  -  Progress to 45 minutes of aerobic  exercise without signs/symptoms of physical distress  Progress to 45 minutes of aerobic exercise without signs/symptoms of physical distress  Progress to 45 minutes of aerobic exercise without signs/symptoms of physical distress rest breaks on treadmill  -   Intensity  -  THRR unchanged  THRR unchanged  THRR unchanged  -     Progression   Progression  -  Continue to progress workloads to maintain intensity without signs/symptoms of physical distress.  Continue to progress workloads to maintain intensity without signs/symptoms of physical distress.  Continue to progress workloads to maintain intensity without signs/symptoms of physical distress.  -   Average METs  -  2  1.55  -  -     Resistance Training   Training Prescription  -  Yes  Yes  Yes  -   Weight  -  4 lbs  4 lbs  4 lbs  -   Reps  -  10-15  10-15  10-15  -     Interval Training   Interval Training  -  No  No  No  -     Oxygen   Oxygen  -  -  -  -  -   Liters  -  -  -  -  -     Treadmill   MPH  -  0.8  0.8  0.5  -   Grade  -  0  0  0  -   Minutes  -  _2 -   METs  -  1.6  1.6  1.4  -     Recumbant Elliptical   Level  -  1  -  1  -   Minutes  -  15  -  15  -   METs  -  1.5  -  1.6  -     T5 Nustep   Level  -  _3 -   Minutes  -  _4 -   METs  -  2.9  1.5  4.8  -     Home Exercise Plan   Plans to continue exercise at  -  -  -  -  Home (comment) walk, chair exercises   Frequency  -  -  -  -  Add 1 additional day to program exercise sessions.   Initial Home Exercises Provided  -  -  -  -  06/03/18   Row Name 06/09/18 1200 06/25/18 1000 07/21/18 1300         Response to Exercise   Blood Pressure (Admit)  118/60  122/78  118/62     Blood Pressure (Exercise)  136/70  -  -     Blood Pressure (Exit)  118/74  118/70  104/62     Heart Rate (Admit)  55 bpm  64 bpm  52 bpm     Heart Rate (Exercise)  93 bpm  73 bpm  69 bpm     Heart Rate (Exit)  64 bpm  74 bpm  65 bpm     Oxygen Saturation (Admit)  90  %  89 %  95 %     Oxygen Saturation (Exercise)  91 %  91 %  86 %     Oxygen Saturation (Exit)  91 %  91 %  95 %     Rating of Perceived Exertion (Exercise)  _0 Perceived Dyspnea (Exercise)  _1 Symptoms  SOB  none  none     Duration  Progress to 45 minutes of aerobic exercise without signs/symptoms of physical distress breaks on treadmill  Progress to 45 minutes of aerobic exercise without signs/symptoms of physical distress  Progress to 45 minutes of aerobic exercise without signs/symptoms of physical distress     Intensity  THRR unchanged  THRR unchanged  THRR unchanged       Progression   Progression  Continue to progress workloads to maintain intensity without signs/symptoms of physical distress.  Continue to progress workloads to maintain intensity without signs/symptoms of physical distress.  Continue to progress workloads to maintain intensity without signs/symptoms of physical distress.     Average METs  1.5  1.7  1.7       Resistance Training   Training Prescription  Yes  Yes  Yes     Weight  4 lbs  4 lb  4 lbs     Reps  10-15  10-15  10-15       Interval Training   Interval Training  No  No  No       Treadmill   MPH  0.5  0.5  -     Grade  0  0  -     Minutes  15  15  -     METs  1.4  1.4  -       Recumbant Elliptical   Level  _2 Minutes  _3 METs  1.6  1.7  1.5       T5 Nustep   Level  -  1  1     SPM  -  50  -     Minutes  -  15  15     METs  -  2.1  1.9       Home Exercise Plan   Plans to continue exercise at  Home (comment) walk, chair exercises  Home (comment) walk, chair exercises  Home (comment) walk, chair exercises     Frequency  Add 1 additional day to program exercise sessions.  Add 1 additional day to program exercise sessions.  Add 1 additional  day to program exercise sessions.     Initial Home Exercises Provided  06/03/18  06/03/18  06/03/18        Exercise Comments: Exercise Comments    Row Name 04/13/18  1153           Exercise Comments  First full day of exercise!  Patient was oriented to gym and equipment including functions, settings, policies, and procedures.  Patient's individual exercise prescription and treatment plan were reviewed.  All starting workloads were established based on the results of the 6 minute walk test done at initial orientation visit.  The plan for exercise progression was also introduced and progression will be customized based on patient's performance and goals.          Exercise Goals and Review: Exercise Goals    Row Name 03/17/18 1526             Exercise Goals   Increase Physical Activity  Yes       Intervention  Provide advice, education, support and counseling about physical activity/exercise needs.;Develop an individualized exercise prescription for aerobic and resistive training based on initial evaluation findings, risk stratification, comorbidities and participant's personal goals.       Expected Outcomes  Short Term: Attend rehab on a regular basis to increase amount of physical activity.;Long Term: Add in home exercise to make exercise part of routine and to increase amount of physical activity.;Long Term: Exercising regularly at least 3-5 days a week.       Increase Strength and Stamina  Yes       Intervention  Provide advice, education, support and counseling about physical activity/exercise needs.;Develop an individualized exercise prescription for aerobic and resistive training based on initial evaluation findings, risk stratification, comorbidities and participant's personal goals.       Expected Outcomes  Short Term: Increase workloads from initial exercise prescription for resistance, speed, and METs.;Short Term: Perform resistance training exercises routinely during rehab and add in resistance training at home;Long Term: Improve cardiorespiratory fitness, muscular endurance and strength as measured by increased METs and functional capacity (6MWT)        Able to understand and use rate of perceived exertion (RPE) scale  Yes       Intervention  Provide education and explanation on how to use RPE scale       Expected Outcomes  Short Term: Able to use RPE daily in rehab to express subjective intensity level;Long Term:  Able to use RPE to guide intensity level when exercising independently       Able to understand and use Dyspnea scale  Yes       Intervention  Provide education and explanation on how to use Dyspnea scale       Expected Outcomes  Short Term: Able to use Dyspnea scale daily in rehab to express subjective sense of shortness of breath during exertion;Long Term: Able to use Dyspnea scale to guide intensity level when exercising independently       Knowledge and understanding of Target Heart Rate Range (THRR)  Yes       Intervention  Provide education and explanation of THRR including how the numbers were predicted and where they are located for reference       Expected Outcomes  Short Term: Able to state/look up THRR;Short Term: Able to use daily as guideline for intensity in rehab;Long Term: Able to use THRR to govern intensity when exercising independently       Able to check pulse independently  Yes       Intervention  Provide education and demonstration on how to check pulse in carotid and radial arteries.;Review the importance of being able to check your own pulse for safety during independent exercise       Expected Outcomes  Short Term: Able to explain why pulse checking is important during independent exercise;Long Term: Able to check pulse independently and accurately       Understanding of Exercise Prescription  Yes       Intervention  Provide education, explanation, and written materials on patient's individual exercise prescription       Expected Outcomes  Short Term: Able to explain program exercise prescription;Long Term: Able to explain home exercise prescription to exercise independently          Exercise Goals  Re-Evaluation : Exercise Goals Re-Evaluation    Row Name 04/13/18 1153 04/28/18 1510 05/12/18 1409 05/25/18 1638 06/03/18 1353     Exercise Goal Re-Evaluation   Exercise Goals Review  Increase Physical Activity;Able to understand and use rate of perceived exertion (RPE) scale;Understanding of Exercise Prescription;Increase Strength and Stamina;Able to understand and use Dyspnea scale  Increase Physical Activity;Increase Strength and Stamina;Understanding of Exercise Prescription  -  Increase Physical Activity;Increase Strength and Stamina;Understanding of Exercise Prescription  Increase Physical Activity;Increase Strength and Stamina;Understanding of Exercise Prescription;Able to understand and use rate of perceived exertion (RPE) scale;Able to understand and use Dyspnea scale;Knowledge and understanding of Target Heart Rate Range (THRR);Able to check pulse independently   Comments  Reviewed RPE scale, THR and program prescription with pt today.  Pt voiced understanding and was given a copy of goals to take home.   Swain has only attended once since last review.  He has been out sick.  He has completed two full days of exercise.  He hopes to return soon.   Out since last review  Murel has only attended twice since last review. He is limited by his transportation.  However, he would benefit more if he could attend more regularly. He is up to level 5 on the NuStep. We will continue to monitor his progress.   Reviewed home exercise with pt today.  Pt plans to walk in drive way and do chair exercises for exercise.  Reviewed THR, pulse, RPE, sign and symptoms, and when to call 911 or MD.  Also discussed weather considerations and indoor options.  Pt voiced understanding.   Expected Outcomes  Short: Use RPE daily to regulate intensity. Long: Follow program prescription in THR.  Short: Attend class regularly.  Long: Continue to follow program prescription.   -  Short: Continue to increase endurance and attend regularly.   Long: Continue to increase strength and stamina.   Short: Start to move more at home.  Long: Continue to increase physcial activity.    [place] Name 06/09/18 1243 06/25/18 1032 07/06/18 1544 07/21/18 1313 07/31/18 1217     Exercise Goal Re-Evaluation   Exercise Goals Review  Increase Physical Activity;Increase Strength and Stamina;Understanding of Exercise Prescription  Increase Physical Activity;Increase Strength and Stamina;Able to understand and use rate of perceived exertion (RPE) scale;Able to understand and use Dyspnea scale  -  Increase Physical Activity;Increase Strength and Stamina;Understanding of Exercise Prescription  Increase Physical Activity;Increase Strength and Stamina   Comments  Hiroshi has only attended once since last review.  He really needs to try to get here more often to make more progression. We will continue to monitor his progress.   Damarrion has only attended three  sessions in December.  Regular attendance is necessary for progression.  Out since last review  Refugio has only had one visit since last review.  He cannot make progress without coming consistently.  We will continue to encourage improved attendance.   Patient has not been exercising outside of rehab. He was adjusting medications and had doctors appointments and will try to get to class regularly.   Expected Outcomes  Short: Attend more regularly.  Long: Continue to move more at home.   Short - attend class 2-3 days per week Long - improve MET level  -  Short: Get to class regularly!!  Long: Continue to work on increasing physcial activity.   Short: Get to class regularly.  Long: Continue to work on increasing physcial activity.       Discharge Exercise Prescription (Final Exercise Prescription Changes): Exercise Prescription Changes - 07/21/18 1300      Response to Exercise   Blood Pressure (Admit)  118/62    Blood Pressure (Exit)  104/62    Heart Rate (Admit)  52 bpm    Heart Rate (Exercise)  69 bpm    Heart Rate (Exit)   65 bpm    Oxygen Saturation (Admit)  95 %    Oxygen Saturation (Exercise)  86 %    Oxygen Saturation (Exit)  95 %    Rating of Perceived Exertion (Exercise)  13    Perceived Dyspnea (Exercise)  2    Symptoms  none    Duration  Progress to 45 minutes of aerobic exercise without signs/symptoms of physical distress    Intensity  THRR unchanged      Progression   Progression  Continue to progress workloads to maintain intensity without signs/symptoms of physical distress.    Average METs  1.7      Resistance Training   Training Prescription  Yes    Weight  4 lbs    Reps  10-15      Interval Training   Interval Training  No      Recumbant Elliptical   Level  1    Minutes  15    METs  1.5      T5 Nustep   Level  1    Minutes  15    METs  1.9      Home Exercise Plan   Plans to continue exercise at  Home (comment)   walk, chair exercises   Frequency  Add 1 additional day to program exercise sessions.    Initial Home Exercises Provided  06/03/18       Nutrition:  Target Goals: Understanding of nutrition guidelines, daily intake of sodium <1577m, cholesterol <2064m calories 30% from fat and 7% or less from saturated fats, daily to have 5 or more servings of fruits and vegetables.  Biometrics: Pre Biometrics - 03/17/18 1527      Pre Biometrics   Height  6' 1.5" (1.867 m)    Weight  (!) 313 lb 3.2 oz (142.1 kg)    Waist Circumference  53 inches    Hip Circumference  50 inches    Waist to Hip Ratio  1.06 %    BMI (Calculated)  40.76    Single Leg Stand  0 seconds        Nutrition Therapy Plan and Nutrition Goals: Nutrition Therapy & Goals - 04/13/18 1358      Nutrition Therapy   Diet  DM    Drug/Food Interactions  Statins/Certain Fruits    Protein (  specify units)  12-13oz    Fiber  35 grams    Whole Grain Foods  3 servings   eats white-wheat or rye bread   Saturated Fats  16 max. grams    Fruits and Vegetables  6 servings/day   8 ideal; eats more fruits than  vegetables   Sodium  2000 grams   does not need to pt reports doctor to tell pt not to worry about monitoring his sodium intake currently     Personal Nutrition Goals   Nutrition Goal  Keep intake of non-nutritious liquid calories IE beer to a minimum. Try to reduce daily consumption from 3-4/day to 1-2/day as an initial goal    Personal Goal #2  Choose evening snacks that are low sugar or zero sugar as opposed to pies and cakes. For example, sugar free pudding or fruit with yogurt    Comments  He has made several changes to his eating practices which has resulted in wt loss of 64# x2 years. BG control has improved, most recent 3 month average 130; HgbA1c 6.7. He has started to eat "lighter" meals and no longer drinks sodas. He has reduced frequency of eating food out of the home from several times per week to zero in the past several months. Drinks water and unsweetened iced tea for beverages, but also typically drinks 3-4 beers per day. Breakfast: oatmeal with fruit and honey, Lunch: apple and cheese, Dinner: soup & sandwich, occasional frozen pizza, beef stew. Typically has a sweet evening snack such as pie but has also tried some sugar free dessert options. Does not eat many vegetables at this time and does not feel that he eats a lot of meat      Intervention Plan   Intervention  Prescribe, educate and counsel regarding individualized specific dietary modifications aiming towards targeted core components such as weight, hypertension, lipid management, diabetes, heart failure and other comorbidities.    Expected Outcomes  Short Term Goal: Understand basic principles of dietary content, such as calories, fat, sodium, cholesterol and nutrients.;Short Term Goal: A plan has been developed with personal nutrition goals set during dietitian appointment.;Long Term Goal: Adherence to prescribed nutrition plan.       Nutrition Assessments:   Nutrition Goals Re-Evaluation: Nutrition Goals Re-Evaluation     [place] Name 04/13/18 1424 05/13/18 1239 07/08/18 1215 07/31/18 1229       Goals   Current Weight  -  -  -  296 lb (134.3 kg)    Nutrition Goal  Keep intake of non-nutritious liquid calories IE beer to a minimum. Try to reduce daily consumption from 3-4/day to 1-2/day as an initial goal  Keep intake of non-nutritious liquid calories IE beer to a minimum by starting to reduce the number of beers you drink per day; Choose evening snacks that are low or zero sugar more often  Keep intake of non-nutritious calories IE beer to a minimum/ start to decrease; Choose evening snacks that are low or zero sugar more often   Lose weight and eat 3-5 smaller meals.    Comment  He has removed all other liquid calories such as soda and sweet tea from his diet but continues to drink alcohol daily. He desires to lose another 10-20#  He has lost 10-12# this month, wt loss was desired but amount was unexpeced per pt. He feels that he has had less fluid retention in his BLE which may account for some wt loss in addition to continued dietary interventions.  He has continued to work on reducing the number of alcoholic beverages he drinks each week and does not drink other sugar sweetened beverages. He has also been minimizing snack intake, though sometimes still chooses cookies or pie "if it's around" in the evenings. Eating 2 meals/day typically and is including vegetables  He reports his wt loss not to be as drastic. CBW 297# with ultimate goal wt of 225#. He has continued to reduce snacking in the evenings, but continues to struggle with this at times. He has made a new goal not to eat after 9pm. He has also been started on Zoloft which he is hopeful will help him be more adherent to his health goals. He continues to drink beer semi often  Jakobi has been trying to keep his portion sizes down. He does not eat junk food but eats breakfast and dinner. He drinks water, ice tea and seldom drinks soday. He states ice cream is his weekness  and eats it 2-3 times a week.     Expected Outcome  He will reduce daily alcohol consumption until he is having an alcoholic beverage only sparingly rather than multiple each day  He will monitor wt more closely and be aware of any edema to BLE. He will continue to work on reducing alcohol intake.  Continue to work on decreasing alcohol intake and to integrate healthy habits that facilitate weight loss towards ultimate wt loss goal. Choose low / zero sugar snack options and continue to eat at home most often.   Short: eat lunch and smaller meals. Long: lose weight in the next two weeks following a better diet.      Personal Goal #2 Re-Evaluation   Personal Goal #2  Choose evening snacks that are low sugar or zero sugar as opposed to pies and cakes. For example, sugar free pudding or fruit with yogurt  -  -  -       Nutrition Goals Discharge (Final Nutrition Goals Re-Evaluation): Nutrition Goals Re-Evaluation - 07/31/18 1229      Goals   Current Weight  296 lb (134.3 kg)    Nutrition Goal  Lose weight and eat 3-5 smaller meals.    Comment  Jencarlo has been trying to keep his portion sizes down. He does not eat junk food but eats breakfast and dinner. He drinks water, ice tea and seldom drinks soday. He states ice cream is his weekness and eats it 2-3 times a week.     Expected Outcome  Short: eat lunch and smaller meals. Long: lose weight in the next two weeks following a better diet.       Psychosocial: Target Goals: Acknowledge presence or absence of significant depression and/or stress, maximize coping skills, provide positive support system. Participant is able to verbalize types and ability to use techniques and skills needed for reducing stress and depression.   Initial Review & Psychosocial Screening: Initial Psych Review & Screening - 03/17/18 1520      Initial Review   Current issues with  Current Depression;History of Depression;Current Stress Concerns    Source of Stress Concerns   Chronic Illness;Unable to perform yard/household activities;Financial    Comments  Financial stress and relationship stress. He is unable to do things that he used to do.      Family Dynamics   Good Support System?  Yes    Comments  He can look to his brothers and his fiance. His mother is also supportive for him.  Barriers   Psychosocial barriers to participate in program  The patient should benefit from training in stress management and relaxation.      Screening Interventions   Interventions  Encouraged to exercise;To provide support and resources with identified psychosocial needs;Provide feedback about the scores to participant;Program counselor consult    Expected Outcomes  Short Term goal: Utilizing psychosocial counselor, staff and physician to assist with identification of specific Stressors or current issues interfering with healing process. Setting desired goal for each stressor or current issue identified.;Long Term Goal: Stressors or current issues are controlled or eliminated.;Short Term goal: Identification and review with participant of any Quality of Life or Depression concerns found by scoring the questionnaire.;Long Term goal: The participant improves quality of Life and PHQ9 Scores as seen by post scores and/or verbalization of changes       Quality of Life Scores:  Scores of 19 and below usually indicate a poorer quality of life in these areas.  A difference of  2-3 points is a clinically meaningful difference.  A difference of 2-3 points in the total score of the Quality of Life Index has been associated with significant improvement in overall quality of life, self-image, physical symptoms, and general health in studies assessing change in quality of life.  PHQ-9: Recent Review Flowsheet Data    Depression screen [facility] 2/9 03/17/2018   Decreased Interest 3   Down, Depressed, Hopeless 2   PHQ - 2 Score 5   Altered sleeping 2   Tired, decreased energy 2   Change in  appetite 1   Feeling bad or failure about yourself  2   Trouble concentrating 1   Moving slowly or fidgety/restless 1   Suicidal thoughts 0   PHQ-9 Score 14   Difficult doing work/chores Very difficult     Interpretation of Total Score  Total Score Depression Severity:  1-4 = Minimal depression, 5-9 = Mild depression, 10-14 = Moderate depression, 15-19 = Moderately severe depression, 20-27 = Severe depression   Psychosocial Evaluation and Intervention: Psychosocial Evaluation - 04/13/18 1215      Psychosocial Evaluation & Interventions   Interventions  Stress management education;Relaxation education;Encouraged to exercise with the program and follow exercise prescription;Therapist referral    Comments  Counselor met with Mr. Hippe Ogan) today for initial psychsoocial evaluation.  He is a 58 year old who has multiple health issues including a recent polyp removed from his airway.  Shylo has had a trache for the past 9 years and also struggles with diabetes; neuropathy; GERD; CHF; obesity.  He sleeps well with a ventillator although he has OSA. Mackenzy has a good support system with a partner of 14 years; and a mother who lives locally and brothers who stay in touch by phone.  He has a good appetite.  Aztlan reports a history of depression and anxiety and is on medications for these currently.  He states he is currently depressed and would like to see a counselor - if possible.  Counselor reviewed the PHQ-9 with Haskel Schroeder score of "14" - which indicated moderate symptoms for depression.  Additionally he has multiple stressors with his health and finances.  He would like to improve his breathing while in this program.  Counselor provided contact information to [facility] for local therapist options to see if they are taking new patients.  Counselor will follow with him about this.     Expected Outcomes  Short:  Subhan will contact a therapist to begin treatment for depressive  symptoms.  Keaten will exercise  consistently in this program for his health and mental health - particularly positive coping strategies.  Long:  Trigo will develop a healthy lifestyle routine of exercise, diet and positive coping strategies for his health and mental health.     Continue Psychosocial Services   Follow up required by counselor       Psychosocial Re-Evaluation: Psychosocial Re-Evaluation    [place] Name 06/03/18 1511 07/31/18 1222           Psychosocial Re-Evaluation   Current issues with  Current Depression;History of Depression;Current Stress Concerns  Current Sleep Concerns;Current Stress Concerns;Current Depression;History of Depression      Comments  Patient states that he sometimes does not make it to therapy because he is to depressed, He states that until now he has been prescribed and anti depressant. He states he has to pick it up today, Informed him that it may be good for him to take it and see if it helps him. He gets beside himself when he thinks about his health issues.  He states he does not sleep regularly and will nap during the day. Part of the issue he says is with his medications. His medications make him tired and he is tryng to adjust. His new medications are making him feel a little better. The trip getting her is hard to do. He feels lazy and his mother and fiance are the ones driving him here. He is working on getting his license so he can drive again. His life and being shut in being 58 year old with health issues making him depressed. He has been having issues for 15 years and feels like a failure since he cannot do alot for himself.      Expected Outcomes  Short: pick up his anti depressant medication. Long: take anti depressant medication regularly.  Short: attend [facility] regularly to improve mood. Long: Keep exercsing and get out of the house more and be more productive  His family is supportive but do not  know the concept of his disease process.      Interventions  Encouraged to attend  Pulmonary Rehabilitation for the exercise  Encouraged to attend Pulmonary Rehabilitation for the exercise;Relaxation education;Stress management education      Continue Psychosocial Services   Follow up required by staff  Follow up required by staff         Psychosocial Discharge (Final Psychosocial Re-Evaluation): Psychosocial Re-Evaluation - 07/31/18 1222      Psychosocial Re-Evaluation   Current issues with  Current Sleep Concerns;Current Stress Concerns;Current Depression;History of Depression    Comments  He states he does not sleep regularly and will nap during the day. Part of the issue he says is with his medications. His medications make him tired and he is tryng to adjust. His new medications are making him feel a little better. The trip getting her is hard to do. He feels lazy and his mother and fiance are the ones driving him here. He is working on getting his license so he can drive again. His life and being shut in being 58 year old with health issues making him depressed. He has been having issues for 15 years and feels like a failure since he cannot do alot for himself.    Expected Outcomes  Short: attend [facility] regularly to improve mood. Long: Keep exercsing and get out of the house more and be more productive  His family is supportive but do not  know the concept of his disease process.    Interventions  Encouraged to attend Pulmonary Rehabilitation for the exercise;Relaxation education;Stress management education    Continue Psychosocial Services   Follow up required by staff       Education: Education Goals: Education classes will be provided on a weekly basis, covering required topics. Participant will state understanding/return demonstration of topics presented.  Learning Barriers/Preferences: Learning Barriers/Preferences - 03/17/18 1525      Learning Barriers/Preferences   Learning Barriers  None    Learning Preferences  None       Education Topics:  Initial  Evaluation Education: - Verbal, written and demonstration of respiratory meds, oximetry and breathing techniques. Instruction on use of nebulizers and MDIs and importance of monitoring MDI activations.   Pulmonary Rehab from 07/31/2018 in [facility] Cardiac and Pulmonary Rehab  Date  03/17/18  Educator  [facility]  Instruction Review Code  1- Verbalizes Understanding      General Nutrition Guidelines/Fats and Fiber: -Group instruction provided by verbal, written material, models and posters to present the general guidelines for heart healthy nutrition. Gives an explanation and review of dietary fats and fiber.   Controlling Sodium/Reading Food Labels: -Group verbal and written material supporting the discussion of sodium use in heart healthy nutrition. Review and explanation with models, verbal and written materials for utilization of the food label.   Pulmonary Rehab from 07/31/2018 in [facility] Cardiac and Pulmonary Rehab  Date  05/13/18  Educator  [facility]  Instruction Review Code  1- Verbalizes Understanding      Exercise Physiology & General Exercise Guidelines: - Group verbal and written instruction with models to review the exercise physiology of the cardiovascular system and associated critical values. Provides general exercise guidelines with specific guidelines to those with heart or lung disease.    Aerobic Exercise & Resistance Training: - Gives group verbal and written instruction on the various components of exercise. Focuses on aerobic and resistive training programs and the benefits of this training and how to safely progress through these programs.   Flexibility, Balance, Mind/Body Relaxation: Provides group verbal/written instruction on the benefits of flexibility and balance training, including mind/body exercise modes such as yoga, pilates and tai chi.  Demonstration and skill practice provided.   Pulmonary Rehab from 07/31/2018 in [facility] Cardiac and Pulmonary Rehab  Date  04/15/18  Educator  [facility]   Instruction Review Code  1- Verbalizes Understanding      Stress and Anxiety: - Provides group verbal and written instruction about the health risks of elevated stress and causes of high stress.  Discuss the correlation between heart/lung disease and anxiety and treatment options. Review healthy ways to manage with stress and anxiety.   Depression: - Provides group verbal and written instruction on the correlation between heart/lung disease and depressed mood, treatment options, and the stigmas associated with seeking treatment.   Exercise & Equipment Safety: - Individual verbal instruction and demonstration of equipment use and safety with use of the equipment.   Pulmonary Rehab from 07/31/2018 in [facility] Cardiac and Pulmonary Rehab  Date  03/17/18  Educator  [facility]  Instruction Review Code  1- Verbalizes Understanding      Infection Prevention: - Provides verbal and written material to individual with discussion of infection control including proper hand washing and proper equipment cleaning during exercise session.   Pulmonary Rehab from 07/31/2018 in [facility] Cardiac and Pulmonary Rehab  Date  03/17/18  Educator  [facility]  Instruction Review Code  1- Verbalizes Understanding  Falls Prevention: - Provides verbal and written material to individual with discussion of falls prevention and safety.   Pulmonary Rehab from 07/31/2018 in [facility] Cardiac and Pulmonary Rehab  Date  03/17/18  Educator  [facility]  Instruction Review Code  1- Verbalizes Understanding      Diabetes: - Individual verbal and written instruction to review signs/symptoms of diabetes, desired ranges of glucose level fasting, after meals and with exercise. Advice that pre and post exercise glucose checks will be done for 3 sessions at entry of program.   Chronic Lung Diseases: - Group verbal and written instruction to review updates, respiratory medications, advancements in procedures and treatments. Discuss use of supplemental  oxygen including available portable oxygen systems, continuous and intermittent flow rates, concentrators, personal use and safety guidelines. Review proper use of inhaler and spacers. Provide informative websites for self-education.    Pulmonary Rehab from 07/31/2018 in [facility] Cardiac and Pulmonary Rehab  Date  07/31/18  Educator  [facility]  Instruction Review Code  1- Verbalizes Understanding      Energy Conservation: - Provide group verbal and written instruction for methods to conserve energy, plan and organize activities. Instruct on pacing techniques, use of adaptive equipment and posture/positioning to relieve shortness of breath.   Triggers and Exacerbations: - Group verbal and written instruction to review types of environmental triggers and ways to prevent exacerbations. Discuss weather changes, air quality and the benefits of nasal washing. Review warning signs and symptoms to help prevent infections. Discuss techniques for effective airway clearance, coughing, and vibrations.   AED/CPR: - Group verbal and written instruction with the use of models to demonstrate the basic use of the AED with the basic ABC's of resuscitation.   Pulmonary Rehab from 07/31/2018 in [facility] Cardiac and Pulmonary Rehab  Date  07/08/18  Educator  [facility]  Instruction Review Code  1- Actuary and Physiology of the Lungs: - Group verbal and written instruction with the use of models to provide basic lung anatomy and physiology related to function, structure and complications of lung disease.   Anatomy & Physiology of the Heart: - Group verbal and written instruction and models provide basic cardiac anatomy and physiology, with the coronary electrical and arterial systems. Review of Valvular disease and Heart Failure   Cardiac Medications: - Group verbal and written instruction to review commonly prescribed medications for heart disease. Reviews the medication, class of the drug, and side  effects.   Know Your Numbers and Risk Factors: -Group verbal and written instruction about important numbers in your health.  Discussion of what are risk factors and how they play a role in the disease process.  Review of Cholesterol, Blood Pressure, Diabetes, and BMI and the role they play in your overall health.   Pulmonary Rehab from 07/31/2018 in [facility] Cardiac [facility] Pulmonary Re[facility]  Date  06/10/18  Educator  [facility]  Instruction Review Code  1- Verbalizes Understanding      Sleep Hygiene: -Provides group verbal and written instruction about how sleep can affect your health.  Define sleep hygiene, discuss sleep cycles and impact of sleep habits. Review good sleep hygiene tips.    Pulmonary Rehab from 07/31/2018 in [facility] Cardiac and Pulmonary Rehab  Date  06/03/18  Educator  [facility]  Instruction Review Code  1- Verbalizes Understanding      Other: -Provides group and verbal instruction on various topics (see comments)    Knowledge Questionnaire Score: Knowledge Questionnaire Score - 03/17/18 1452  Knowledge Questionnaire Score   Pre Score  16/18   REVIEWED WITH patient       Core Components/Risk Factors/Patient Goals at Admission: Personal Goals and Risk Factors at Admission - 03/17/18 1525      Core Components/Risk Factors/Patient Goals on Admission    Weight Management  Weight Loss    Intervention  Weight Management: Develop a combined nutrition and exercise program designed to reach desired caloric intake, while maintaining appropriate intake of nutrient and fiber, sodium and fats, and appropriate energy expenditure required for the weight goal.;Weight Management/Obesity: Establish reasonable short term and long term weight goals.;Weight Management: Provide education and appropriate resources to help participant work on and attain dietary goals.    Admit Weight  313 lb 3.2 oz (142.1 kg)    Goal Weight: Short Term  308 lb (139.7 kg)    Goal Weight: Long Term  290 lb (131.5 kg)     Expected Outcomes  Short Term: Continue to assess and modify interventions until short term weight is achieved;Long Term: Adherence to nutrition and physical activity/exercise program aimed toward attainment of established weight goal;Understanding recommendations for meals to include 15-35% energy as protein, 25-35% energy from fat, 35-60% energy from carbohydrates, less than 240m of dietary cholesterol, 20-35 gm of total fiber daily;Understanding of distribution of calorie intake throughout the day with the consumption of 4-5 meals/snacks;Weight Loss: Understanding of general recommendations for a balanced deficit meal plan, which promotes 1-2 lb weight loss per week and includes a negative energy balance of [phone] kcal/d    Improve shortness of breath with ADL's  Yes    Intervention  Provide education, individualized exercise plan and daily activity instruction to help decrease symptoms of SOB with activities of daily living.    Expected Outcomes  Short Term: Improve cardiorespiratory fitness to achieve a reduction of symptoms when performing ADLs;Long Term: Be able to perform more ADLs without symptoms or delay the onset of symptoms    Diabetes  Yes    Intervention  Provide education about signs/symptoms and action to take for hypo/hyperglycemia.;Provide education about proper nutrition, including hydration, and aerobic/resistive exercise prescription along with prescribed medications to achieve blood glucose in normal ranges: Fasting glucose 65-99 mg/dL    Expected Outcomes  Short Term: Participant verbalizes understanding of the signs/symptoms and immediate care of hyper/hypoglycemia, proper foot care and importance of medication, aerobic/resistive exercise and nutrition plan for blood glucose control.;Long Term: Attainment of HbA1C < 7%.   his A1c was 6.5 he states   Heart Failure  Yes   pace maker   Intervention  Provide a combined exercise and nutrition program that is supplemented with  education, support and counseling about heart failure. Directed toward relieving symptoms such as shortness of breath, decreased exercise tolerance, and extremity edema.    Expected Outcomes  Improve functional capacity of life;Short term: Attendance in program 2-3 days a week with increased exercise capacity. Reported lower sodium intake. Reported increased fruit and vegetable intake. Reports medication compliance.;Short term: Daily weights obtained and reported for increase. Utilizing diuretic protocols set by physician.;Long term: Adoption of self-care skills and reduction of barriers for early signs and symptoms recognition and intervention leading to self-care maintenance.    Hypertension  Yes    Intervention  Provide education on lifestyle modifcations including regular physical activity/exercise, weight management, moderate sodium restriction and increased consumption of fresh fruit, vegetables, and low fat dairy, alcohol moderation, and smoking cessation.;Monitor prescription use compliance.    Expected Outcomes  Short Term:  Continued assessment and intervention until BP is < 140/45m HG in hypertensive participants. < 130/847mHG in hypertensive participants with diabetes, heart failure or chronic kidney disease.;Long Term: Maintenance of blood pressure at goal levels.    Lipids  Yes    Intervention  Provide education and support for participant on nutrition & aerobic/resistive exercise along with prescribed medications to achieve LDL <7024mHDL >7m80m  Expected Outcomes  Short Term: Participant states understanding of desired cholesterol values and is compliant with medications prescribed. Participant is following exercise prescription and nutrition guidelines.;Long Term: Cholesterol controlled with medications as prescribed, with individualized exercise RX and with personalized nutrition plan. Value goals: LDL < 70mg36mL > 40 mg.       Core Components/Risk Factors/Patient Goals Review:   Goals and Risk Factor Review    Row Name 06/03/18 1515 07/31/18 1235           Core Components/Risk Factors/Patient Goals Review   Personal Goals Review  Weight Management/Obesity;Improve shortness of breath with ADL's;Heart Failure;Diabetes;Lipids  Weight Management/Obesity;Improve shortness of breath with ADL's;Heart Failure;Diabetes;Lipids      Review  Jeral's attendance has been sporadic since he suffers from depression. He states that after he exercises he feels better. Informed him that he has to hold on to that feeling he gets when he exercises and continue moving forward.  Patient has not attended class in three weeks or more. He is going to try to come more regularly to class to improve his overall health. He states that he had labs drawn recently and states that they were good.       Expected Outcomes  Short: Attend [facility] regularly to improve shortness of breath with ADL's. Long: maintain independence with ADL's   Short: Attend [facility] regularly to improve shortness of breath with ADL's. Long: maintain independence with ADL's          Core Components/Risk Factors/Patient Goals at Discharge (Final Review):  Goals and Risk Factor Review - 07/31/18 1235      Core Components/Risk Factors/Patient Goals Review   Personal Goals Review  Weight Management/Obesity;Improve shortness of breath with ADL's;Heart Failure;Diabetes;Lipids    Review  Patient has not attended class in three weeks or more. He is going to try to come more regularly to class to improve his overall health. He states that he had labs drawn recently and states that they were good.     Expected Outcomes  Short: Attend [facility] regularly to improve shortness of breath with ADL's. Long: maintain independence with ADL's        ITP Comments: ITP Comments    Row Name 03/17/18 1446 04/13/18 0855 04/28/18 1509 05/11/18 0841 05/13/18 1127   ITP Comments  Medical Evaluation completed. Chart sent for review and changes to Dr.  Mark Emily Filbertctor of [facility] found [facility] encounter 02/27/18  30 day review completed. ITP sent to Dr. Mark Emily Fil[facility]ctor of [facility] [place] with ITP unless changes are made by physician.  Called to check on pt.  He has been out since 04/15/18.  He said that last week he was out sick and that on Monday he couldn't get a ride scheduled.  He has a ride already set up for tomorrow and hopes to be here then.    30 day review completed. ITP sent to Dr. Mark Emily Filbertctor of [facility] with ITP unless changes are made by physician.  Hazim rPhenixrned today after being out for almost a month  [place] Name 06/08/18 [phone] 07/06/18 0843 07/21/18 1359 08/03/18 0836     ITP Comments  30 day review completed. ITP sent to Dr. Emily Filbert Director of [place]. Continue with ITP unless changes are made by physician.  Patient has not been to [place] to obtain goals. 30 day review completed. ITP sent to Dr. Emily Filbert Director of [place]. Continue with ITP unless changes are made by physician.  Patient has not been in [place] since 07/08/2018. Left message for patient.  30 day review completed. ITP sent to Dr. Emily Filbert Director of [place]. Continue with ITP unless changes are made by physician.       Comments: 30 day review

## 2018-08-10 DIAGNOSIS — J9610 Chronic respiratory failure, unspecified whether with hypoxia or hypercapnia: Secondary | ICD-10-CM

## 2018-08-10 NOTE — Progress Notes (Signed)
Daily Session Note  Patient Details  Name: [name] MRN: [id number] Date of Birth: 12/22/1960 Referring Provider:     Pulmonary Rehab from 03/17/2018 in [facility] Re[facility]  Referring Provider  [name] MD      Encounter Date: 08/10/2018  Check In:      Social History   Tobacco Use  Smoking Status Never Smoker  Smokeless Tobacco Never Used    Goals Met:  Proper associated with RPD/PD & O2 Sat Independence with exercise equipment Exercise tolerated well Strength training completed today  Goals Unmet:  Not Applicable  Comments: Pt able to follow exercise prescription today without complaint.  Will continue to monitor for progression.    Dr. Emily Filbert is Medical Director for [place] and [facility].

## 2018-08-14 ENCOUNTER — Telehealth: Payer: Self-pay | Admitting: *Deleted

## 2018-08-14 NOTE — Telephone Encounter (Signed)
Received a referral from Phineas Real but patient is already established with Dr.Ramachandran. Left detailed message to call office and schedule follow up.

## 2018-08-18 ENCOUNTER — Telehealth: Payer: Self-pay

## 2018-08-18 DIAGNOSIS — J9610 Chronic respiratory failure, unspecified whether with hypoxia or hypercapnia: Secondary | ICD-10-CM

## 2018-08-18 NOTE — Progress Notes (Signed)
Pulmonary Individual Treatment Plan  Patient Details  Name: [name] MRN: [id number] Date of Birth: 05-18-1961 Referring Provider:     Pulmonary Rehab from 03/17/2018 in [facility]iac [facility]monary Rehab  Referring Provider  [name] MD      Initial Encounter Date:    Pulmonary Rehab from 03/17/2018 in [facility] Cardiac and Pulmonary Rehab  Date  03/17/18      Visit Diagnosis: Chronic respiratory failure, unspecified whether with hypoxia or hypercapnia ([place])  Patient's Home Medications on Admission:  Current Outpatient Medications:  .  acetaminophen (TYLENOL) 325 MG tablet, Take 1 tablet (325 mg total) by mouth every 6 (six) hours as needed for mild pain (or Fever >/= 101)., Disp: , Rfl:  .  ALPRAZolam (XANAX) 0.5 MG tablet, Take 1 tablet (0.5 mg total) by mouth every 8 (eight) hours as needed for anxiety., Disp: 20 tablet, Rfl: 0 .  AMBULATORY NON FORMULARY MEDICATION, Medication Name: Trachea collar for use with nebulizer DX: J96.10, Disp: 1 each, Rfl: 0 .  AMBULATORY NON FORMULARY MEDICATION, Medication Name: Nebulizer DX: J96.10, Disp: 1 each, Rfl: 0 .  amiodarone (PACERONE) 200 MG tablet, Take 200 mg by mouth 2 (two) times daily., Disp: , Rfl: 8 .  atorvastatin (LIPITOR) 40 MG tablet, Take 1 tablet (40 mg total) by mouth daily at 6 PM., Disp: 30 tablet, Rfl: 1 .  atorvastatin (LIPITOR) 80 MG tablet, Take 80 mg by mouth at bedtime., Disp: , Rfl:  .  budesonide (PULMICORT) 0.5 MG/2ML nebulizer solution, Take 2 mLs (0.5 mg total) by nebulization 2 (two) times daily., Disp: 75 mL, Rfl: 12 .  busPIRone (BUSPAR) 10 MG tablet, Take 10 mg by mouth 2 (two) times daily as needed. , Disp: , Rfl:  .  collagenase (SANTYL) ointment, Apply topically daily., Disp: 15 g, Rfl: 0 .  enalapril (VASOTEC) 2.5 MG tablet, Take 1 tablet (2.5 mg total) by mouth daily., Disp: 30 tablet, Rfl: 0 .  feeding supplement, GLUCERNA SHAKE, (GLUCERNA SHAKE) LIQD, Take 237 mLs by mouth 2 (two) times  daily between meals., Disp: 60 Can, Rfl: 0 .  folic acid (FOLVITE) 1 MG tablet, Take 1 tablet (1 mg total) by mouth daily., Disp: 30 tablet, Rfl: 0 .  furosemide (LASIX) 40 MG tablet, Take 1 tablet (40 mg total) by mouth daily., Disp: 30 tablet, Rfl: 0 .  gabapentin (NEURONTIN) 800 MG tablet, Take 1 tablet (800 mg total) by mouth 3 (three) times daily. 1 tablet twice daily (Patient taking differently: Take 1,600 mg by mouth 2 (two) times daily. ), Disp: 60 tablet, Rfl: 0 .  insulin detemir (LEVEMIR) 100 unit/ml SOLN, Inject 20 Units into the skin 2 (two) times daily. , Disp: , Rfl:  .  insulin lispro (HUMALOG) 100 UNIT/ML injection, Inject 2-10 Units into the skin 3 (three) times daily with meals. , Disp: , Rfl:  .  ipratropium-albuterol (DUONEB) 0.5-2.5 (3) MG/3ML SOLN, Take 3 mLs by nebulization 3 (three) times daily., Disp: 360 mL, Rfl: 1 .  lamoTRIgine (LAMICTAL) 150 MG tablet, Take 150 mg by mouth 2 (two) times daily. , Disp: , Rfl:  .  metFORMIN (GLUCOPHAGE-XR) 500 MG 24 hr tablet, Take 1,000 mg by mouth 2 (two) times daily. , Disp: , Rfl:  .  metoprolol tartrate 75 MG TABS, Take 75 mg by mouth 2 (two) times daily. 100 mg twice daily, Disp: 60 tablet, Rfl: 0 .  Multiple Vitamin (MULTIVITAMIN WITH MINERALS) TABS tablet, Take 1 tablet by mouth daily., Disp: ,  Rfl:  .  omeprazole (PRILOSEC) 20 MG capsule, Take 20 mg by mouth daily. Once daily, Disp: , Rfl:  .  oxyCODONE-acetaminophen (PERCOCET) 10-325 MG tablet, Take 1 tablet by mouth 4 (four) times daily as needed for pain. , Disp: , Rfl:  .  polyethylene glycol (MIRALAX / GLYCOLAX) packet, Take 17 g by mouth daily as needed for mild constipation., Disp: 14 each, Rfl: 0 .  rivaroxaban (XARELTO) 20 MG TABS tablet, Take 20 mg by mouth daily., Disp: , Rfl:  .  senna-docusate (SENOKOT-S) 8.6-50 MG tablet, Take 2 tablets by mouth at bedtime as needed for mild constipation., Disp: , Rfl:  .  thiamine 100 MG tablet, Take 1 tablet (100 mg total) by mouth  daily., Disp: 30 tablet, Rfl: 0  Past Medical History: Past Medical History:  Diagnosis Date  . CHF (congestive heart failure) ([place])   . Diabetes mellitus ([place])   . Diabetic peripheral neuropathy ([place])   . Elevated liver enzymes    fatty liver per [facility]  . GERD (gastroesophageal reflux disease)   . History of pulmonary embolus (PE)   . Hyperlipidemia   . Hypertension   . Myocardial infarct ([place])   . OSA (obstructive sleep apnea)   . 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 Renal insufficiency     Tobacco Use: Social History   Tobacco Use  Smoking Status Never Smoker  Smokeless Tobacco Never Used    Labs: Recent Review Flowsheet Data    Labs for ITP Cardiac and Pulmonary Rehab Latest Ref Rng & Units 07/17/2017 10/08/2017 10/24/2017   Cholestrol 0 - 200 mg/dL 125 - -   LDLCALC 0 - 99 mg/dL 53 - -   HDL >40 mg/dL 56 - -   Trlycerides <150 mg/dL 82 - -   Hemoglobin A1c 4.8 - 5.6 % 6.1(H) 5.7(H) -   HCO3 20.0 - 28.0 mmol/L - - 32.3(H)   O2SAT % - - 80.2       Pulmonary Assessment Scores: Pulmonary Assessment Scores    Row Name 03/17/18 1451         ADL UCSD   ADL Phase  Entry     SOB Score total  63     Rest  2     Walk  2     Stairs  4     Bath  1     Dress  1     Shop  3       CAT Score   CAT Score  25       mMRC Score   mMRC Score  2        Pulmonary Function Assessment: Pulmonary Function Assessment - 03/17/18 1519      Breath   Bilateral Breath Sounds  Clear    Shortness of Breath  Limiting activity;Yes;Panic with Shortness of Breath       Exercise Target Goals: Exercise Program Goal: Individual exercise prescription set using results from initial 6 min walk test and THRR while considering  patient's activity barriers and safety.   Exercise Prescription Goal: Initial exercise prescription builds to 30-45 minutes a day of aerobic activity, 2-3 days per week.  Home exercise guidelines will be given to  patient during program as part of exercise prescription that the participant will acknowledge.  Activity Barriers & Risk Stratification: Activity Barriers & Cardiac Risk Stratification - 03/17/18 1522      Activity Barriers & Cardiac Risk  Stratification   Activity Barriers  Muscular Weakness;Deconditioning;Balance Concerns;History of Falls;Assistive Device;Shortness of Breath;Back Problems;Decreased Ventricular Function   occasional back spasm      6 Minute Walk: 6 Minute Walk    Row Name 03/17/18 1520         6 Minute Walk   Phase  Initial     Distance  525 feet used rollator     Walk Time  6 minutes     # of Rest Breaks  0     MPH  0.99     METS  1.65     RPE  12     Perceived Dyspnea   1     VO2 Peak  5.78     Symptoms  Yes (comment)     Comments  arms got tired, legs got tight, slightly dizzy headed     Resting HR  61 bpm     Resting BP  124/72     Resting Oxygen Saturation   92 %     Exercise Oxygen Saturation  during 6 min walk  80 %     Max Ex. HR  100 bpm     Max Ex. BP  146/64     2 Minute Post BP  136/72       Interval HR   1 Minute HR  75     2 Minute HR  80     3 Minute HR  91     4 Minute HR  98     5 Minute HR  100     6 Minute HR  90     2 Minute Post HR  60     Interval Heart Rate?  Yes       Interval Oxygen   Interval Oxygen?  Yes     Baseline Oxygen Saturation %  92 %     1 Minute Oxygen Saturation %  83 %     1 Minute Liters of Oxygen  0 L Room Air     2 Minute Oxygen Saturation %  82 %     2 Minute Liters of Oxygen  0 L     3 Minute Oxygen Saturation %  82 %     3 Minute Liters of Oxygen  0 L     4 Minute Oxygen Saturation %  82 %     4 Minute Liters of Oxygen  0 L     5 Minute Oxygen Saturation %  80 %     5 Minute Liters of Oxygen  0 L     6 Minute Oxygen Saturation %  82 %     6 Minute Liters of Oxygen  0 L     2 Minute Post Oxygen Saturation %  85 %     2 Minute Post Liters of Oxygen  0 L       Oxygen Initial  Assessment: Oxygen Initial Assessment - 03/17/18 1518      Home Oxygen   Home Oxygen Device  Home Concentrator    Sleep Oxygen Prescription  Continuous    Liters per minute  3.5    Home Exercise Oxygen Prescription  None    Home at Rest Exercise Oxygen Prescription  None    Compliance with Home Oxygen Use  Yes      Initial 6 min Walk   Oxygen Used  None      Program Oxygen Prescription   Program Oxygen Prescription  None      Intervention   Short Term Goals  To learn and exhibit compliance with exercise, home and travel O2 prescription;To learn and understand importance of monitoring SPO2 with pulse oximeter and demonstrate accurate use of the pulse oximeter.;To learn and understand importance of maintaining oxygen saturations>88%;To learn and demonstrate proper pursed lip breathing techniques or other breathing techniques.;To learn and demonstrate proper use of respiratory medications    Long  Term Goals  Exhibits compliance with exercise, home and travel O2 prescription;Verbalizes importance of monitoring SPO2 with pulse oximeter and return demonstration;Maintenance of O2 saturations>88%;Exhibits proper breathing techniques, such as pursed lip breathing or other method taught during program session;Compliance with respiratory medication;Demonstrates proper use of MDI's       Oxygen Re-Evaluation: Oxygen Re-Evaluation    Row Name 06/03/18 1504 07/31/18 1219           Program Oxygen Prescription   Program Oxygen Prescription  None  None        Home Oxygen   Home Oxygen Device  Home Concentrator  Home Concentrator      Sleep Oxygen Prescription  Continuous  Continuous      Liters per minute  3.5  3.5      Home Exercise Oxygen Prescription  None  None      Home at Rest Exercise Oxygen Prescription  None  None      Compliance with Home Oxygen Use  Yes  Yes        Goals/Expected Outcomes   Short Term Goals  To learn and exhibit compliance with exercise, home and travel O2  prescription;To learn and understand importance of monitoring SPO2 with pulse oximeter and demonstrate accurate use of the pulse oximeter.;To learn and understand importance of maintaining oxygen saturations>88%;To learn and demonstrate proper pursed lip breathing techniques or other breathing techniques.;To learn and demonstrate proper use of respiratory medications  To learn and exhibit compliance with exercise, home and travel O2 prescription;To learn and understand importance of monitoring SPO2 with pulse oximeter and demonstrate accurate use of the pulse oximeter.;To learn and understand importance of maintaining oxygen saturations>88%;To learn and demonstrate proper pursed lip breathing techniques or other breathing techniques.;To learn and demonstrate proper use of respiratory medications      Long  Term Goals  Exhibits compliance with exercise, home and travel O2 prescription;Verbalizes importance of monitoring SPO2 with pulse oximeter and return demonstration;Maintenance of O2 saturations>88%;Exhibits proper breathing techniques, such as pursed lip breathing or other method taught during program session;Compliance with respiratory medication;Demonstrates proper use of MDI's  Exhibits compliance with exercise, home and travel O2 prescription;Verbalizes importance of monitoring SPO2 with pulse oximeter and return demonstration;Maintenance of O2 saturations>88%;Exhibits proper breathing techniques, such as pursed lip breathing or other method taught during program session;Compliance with respiratory medication;Demonstrates proper use of MDI's      Comments  Patient has a pulse oximeter at home and is to check his oxygen when he is exerting himself. Patient has a trach and is to practice at deep and diphragmatic breathing.  Patient has been monitoring his oxygen at home. He is sleeping with his oxygen everynight. His oxygen is between 90-96 percent at home.      Goals/Expected Outcomes  Short: monitor  oxygen at home with exertion. Long: maintain oxygen saturations above 88 percent independently.  Short: attend [facility] regulary. Long: maintain exercise independently to imrpove shortness of breath.         Oxygen Discharge (Final Oxygen Re-Evaluation): Oxygen Re-Evaluation - 07/31/18 1219  Program Oxygen Prescription   Program Oxygen Prescription  None      Home Oxygen   Home Oxygen Device  Home Concentrator    Sleep Oxygen Prescription  Continuous    Liters per minute  3.5    Home Exercise Oxygen Prescription  None    Home at Rest Exercise Oxygen Prescription  None    Compliance with Home Oxygen Use  Yes      Goals/Expected Outcomes   Short Term Goals  To learn and exhibit compliance with exercise, home and travel O2 prescription;To learn and understand importance of monitoring SPO2 with pulse oximeter and demonstrate accurate use of the pulse oximeter.;To learn and understand importance of maintaining oxygen saturations>88%;To learn and demonstrate proper pursed lip breathing techniques or other breathing techniques.;To learn and demonstrate proper use of respiratory medications    Long  Term Goals  Exhibits compliance with exercise, home and travel O2 prescription;Verbalizes importance of monitoring SPO2 with pulse oximeter and return demonstration;Maintenance of O2 saturations>88%;Exhibits proper breathing techniques, such as pursed lip breathing or other method taught during program session;Compliance with respiratory medication;Demonstrates proper use of MDI's    Comments  Patient has been monitoring his oxygen at home. He is sleeping with his oxygen everynight. His oxygen is between 90-96 percent at home.    Goals/Expected Outcomes  Short: attend [facility] regulary. Long: maintain exercise independently to imrpove shortness of breath.       Initial Exercise Prescription: Initial Exercise Prescription - 03/17/18 1500      Date of Initial Exercise RX and Referring Provider    Date  03/17/18    Referring Provider  [name] MD      Oxygen   Oxygen  Continuous    Liters  2      Treadmill   MPH  0.8    Grade  0    Minutes  15    METs  1.6      Recumbant Elliptical   Level  1    RPM  50    Minutes  15    METs  1.6      T5 Nustep   Level  1    SPM  80    Minutes  15    METs  1.6      Prescription Details   Frequency (times per week)  3    Duration  Progress to 45 minutes of aerobic exercise without signs/symptoms of physical distress      Intensity   THRR 40-80% of Max Heartrate  102-144    Ratings of Perceived Exertion  11-13    Perceived Dyspnea  0-4      Progression   Progression  Continue to progress workloads to maintain intensity without signs/symptoms of physical distress.      Resistance Training   Training Prescription  Yes    Weight  4 lbs    Reps  10-15       Perform Capillary Blood Glucose checks as needed.  Exercise Prescription Changes: Exercise Prescription Changes    Row Name 03/17/18 1500 04/14/18 1500 04/28/18 1500 05/25/18 1600 06/03/18 1400     Response to Exercise   Blood Pressure (Admit)  124/72  130/62  128/62  124/74  -   Blood Pressure (Exercise)  146/64  138/76  140/62  -  -   Blood Pressure (Exit)  136/72  138/82  126/64  144/78  -   Heart Rate (Admit)  61 bpm  54 bpm  65 bpm  57 bpm  -   Heart Rate (Exercise)  100 bpm  78 bpm  89 bpm  131 bpm  -   Heart Rate (Exit)  60 bpm  53 bpm  62 bpm  64 bpm  -   Oxygen Saturation (Admit)  92 %  92 %  93 %  95 %  -   Oxygen Saturation (Exercise)  80 %  90 %  93 %  87 %  -   Oxygen Saturation (Exit)  85 %  93 %  98 %  93 %  -   Rating of Perceived Exertion (Exercise)  '12  15  15  13  '$ -   Perceived Dyspnea (Exercise)  '1  3  3  2  '$ -   Symptoms  SOB, arms and legs tired and tight, slightly dizzy  SOB  SOB  SOB  -   Comments  walk test results  first full day of exercise  second full day of exercise  -  -   Duration  -  Progress to 45 minutes of aerobic  exercise without signs/symptoms of physical distress  Progress to 45 minutes of aerobic exercise without signs/symptoms of physical distress  Progress to 45 minutes of aerobic exercise without signs/symptoms of physical distress rest breaks on treadmill  -   Intensity  -  THRR unchanged  THRR unchanged  THRR unchanged  -     Progression   Progression  -  Continue to progress workloads to maintain intensity without signs/symptoms of physical distress.  Continue to progress workloads to maintain intensity without signs/symptoms of physical distress.  Continue to progress workloads to maintain intensity without signs/symptoms of physical distress.  -   Average METs  -  2  1.55  -  -     Resistance Training   Training Prescription  -  Yes  Yes  Yes  -   Weight  -  4 lbs  4 lbs  4 lbs  -   Reps  -  10-15  10-15  10-15  -     Interval Training   Interval Training  -  No  No  No  -     Oxygen   Oxygen  -  -  -  -  -   Liters  -  -  -  -  -     Treadmill   MPH  -  0.8  0.8  0.5  -   Grade  -  0  0  0  -   Minutes  -  '15  15  15  '$ -   METs  -  1.6  1.6  1.4  -     Recumbant Elliptical   Level  -  1  -  1  -   Minutes  -  15  -  15  -   METs  -  1.5  -  1.6  -     T5 Nustep   Level  -  '1  1  5  '$ -   Minutes  -  '15  15  15  '$ -   METs  -  2.9  1.5  4.8  -     Home Exercise Plan   Plans to continue exercise at  -  -  -  -  Home (comment) walk, chair exercises   Frequency  -  -  -  -  Add 1 additional  day to program exercise sessions.   Initial Home Exercises Provided  -  -  -  -  06/03/18   Row Name 06/09/18 1200 06/25/18 1000 07/21/18 1300 08/03/18 1500 08/18/18 1400     Response to Exercise   Blood Pressure (Admit)  118/60  122/78  118/62  128/70  110/70   Blood Pressure (Exercise)  136/70  -  -  -  -   Blood Pressure (Exit)  118/74  118/70  104/62  118/60  120/80   Heart Rate (Admit)  55 bpm  64 bpm  52 bpm  52 bpm  51 bpm   Heart Rate (Exercise)  93 bpm  73 bpm  69 bpm  74 bpm  95  bpm   Heart Rate (Exit)  64 bpm  74 bpm  65 bpm  63 bpm  62 bpm   Oxygen Saturation (Admit)  90 %  89 %  95 %  94 %  93 %   Oxygen Saturation (Exercise)  91 %  91 %  86 %  87 %  88 %   Oxygen Saturation (Exit)  91 %  91 %  95 %  94 %  89 %   Rating of Perceived Exertion (Exercise)  '13  14  13  13  15   '$ Perceived Dyspnea (Exercise)  '2  3  2  2  2   '$ Symptoms  SOB  none  none  none  none   Duration  Progress to 45 minutes of aerobic exercise without signs/symptoms of physical distress breaks on treadmill  Progress to 45 minutes of aerobic exercise without signs/symptoms of physical distress  Progress to 45 minutes of aerobic exercise without signs/symptoms of physical distress  Progress to 45 minutes of aerobic exercise without signs/symptoms of physical distress  Progress to 45 minutes of aerobic exercise without signs/symptoms of physical distress   Intensity  THRR unchanged  THRR unchanged  THRR unchanged  THRR unchanged  THRR unchanged     Progression   Progression  Continue to progress workloads to maintain intensity without signs/symptoms of physical distress.  Continue to progress workloads to maintain intensity without signs/symptoms of physical distress.  Continue to progress workloads to maintain intensity without signs/symptoms of physical distress.  Continue to progress workloads to maintain intensity without signs/symptoms of physical distress.  Continue to progress workloads to maintain intensity without signs/symptoms of physical distress.   Average METs  1.5  1.7  1.7  1.4  1.67     Resistance Training   Training Prescription  Yes  Yes  Yes  Yes  Yes   Weight  4 lbs  4 lb  4 lbs  4 lbs  4 lbs   Reps  10-15  10-15  10-15  10-15  10-15     Interval Training   Interval Training  No  No  No  No  No     Treadmill   MPH  0.5  0.5  -  0.5  0.5   Grade  0  0  -  0  0   Minutes  15  15  -  15  15   METs  1.4  1.4  -  1.4  1.4     Recumbant Elliptical   Level  '1  1  1  1  1    '$ Minutes  '15  15  15  15  15   '$ METs  1.6  1.7  1.5  1.4  1.6     T5 Nustep   Level  -  1  1  -  1   SPM  -  50  -  -  -   Minutes  -  15  15  -  15   METs  -  2.1  1.9  -  2     Home Exercise Plan   Plans to continue exercise at  Home (comment) walk, chair exercises  Home (comment) walk, chair exercises  Home (comment) walk, chair exercises  Home (comment) walk, chair exercises  Home (comment) walk, chair exercises   Frequency  Add 1 additional day to program exercise sessions.  Add 1 additional day to program exercise sessions.  Add 1 additional day to program exercise sessions.  Add 1 additional day to program exercise sessions.  Add 1 additional day to program exercise sessions.   Initial Home Exercises Provided  06/03/18  06/03/18  06/03/18  06/03/18  06/03/18      Exercise Comments: Exercise Comments    Row Name 04/13/18 1153           Exercise Comments  First full day of exercise!  Patient was oriented to gym and equipment including functions, settings, policies, and procedures.  Patient's individual exercise prescription and treatment plan were reviewed.  All starting workloads were established based on the results of the 6 minute walk test done at initial orientation visit.  The plan for exercise progression was also introduced and progression will be customized based on patient's performance and goals.          Exercise Goals and Review: Exercise Goals    Row Name 03/17/18 1526             Exercise Goals   Increase Physical Activity  Yes       Intervention  Provide advice, education, support and counseling about physical activity/exercise needs.;Develop an individualized exercise prescription for aerobic and resistive training based on initial evaluation findings, risk stratification, comorbidities and participant's personal goals.       Expected Outcomes  Short Term: Attend rehab on a regular basis to increase amount of physical activity.;Long Term: Add in home exercise  to make exercise part of routine and to increase amount of physical activity.;Long Term: Exercising regularly at least 3-5 days a week.       Increase Strength and Stamina  Yes       Intervention  Provide advice, education, support and counseling about physical activity/exercise needs.;Develop an individualized exercise prescription for aerobic and resistive training based on initial evaluation findings, risk stratification, comorbidities and participant's personal goals.       Expected Outcomes  Short Term: Increase workloads from initial exercise prescription for resistance, speed, and METs.;Short Term: Perform resistance training exercises routinely during rehab and add in resistance training at home;Long Term: Improve cardiorespiratory fitness, muscular endurance and strength as measured by increased METs and functional capacity (6MWT)       Able to understand and use rate of perceived exertion (RPE) scale  Yes       Intervention  Provide education and explanation on how to use RPE scale       Expected Outcomes  Short Term: Able to use RPE daily in rehab to express subjective intensity level;Long Term:  Able to use RPE to guide intensity level when exercising independently       Able to understand and use Dyspnea scale  Yes  Intervention  Provide education and explanation on how to use Dyspnea scale       Expected Outcomes  Short Term: Able to use Dyspnea scale daily in rehab to express subjective sense of shortness of breath during exertion;Long Term: Able to use Dyspnea scale to guide intensity level when exercising independently       Knowledge and understanding of Target Heart Rate Range (THRR)  Yes       Intervention  Provide education and explanation of THRR including how the numbers were predicted and where they are located for reference       Expected Outcomes  Short Term: Able to state/look up THRR;Short Term: Able to use daily as guideline for intensity in rehab;Long Term: Able to use  THRR to govern intensity when exercising independently       Able to check pulse independently  Yes       Intervention  Provide education and demonstration on how to check pulse in carotid and radial arteries.;Review the importance of being able to check your own pulse for safety during independent exercise       Expected Outcomes  Short Term: Able to explain why pulse checking is important during independent exercise;Long Term: Able to check pulse independently and accurately       Understanding of Exercise Prescription  Yes       Intervention  Provide education, explanation, and written materials on patient's individual exercise prescription       Expected Outcomes  Short Term: Able to explain program exercise prescription;Long Term: Able to explain home exercise prescription to exercise independently          Exercise Goals Re-Evaluation : Exercise Goals Re-Evaluation    Row Name 04/13/18 1153 04/28/18 1510 05/12/18 1409 05/25/18 1638 06/03/18 1353     Exercise Goal Re-Evaluation   Exercise Goals Review  Increase Physical Activity;Able to understand and use rate of perceived exertion (RPE) scale;Understanding of Exercise Prescription;Increase Strength and Stamina;Able to understand and use Dyspnea scale  Increase Physical Activity;Increase Strength and Stamina;Understanding of Exercise Prescription  -  Increase Physical Activity;Increase Strength and Stamina;Understanding of Exercise Prescription  Increase Physical Activity;Increase Strength and Stamina;Understanding of Exercise Prescription;Able to understand and use rate of perceived exertion (RPE) scale;Able to understand and use Dyspnea scale;Knowledge and understanding of Target Heart Rate Range (THRR);Able to check pulse independently   Comments  Reviewed RPE scale, THR and program prescription with pt today.  Pt voiced understanding and was given a copy of goals to take home.   Merwin has only attended once since last review.  He has been out  sick.  He has completed two full days of exercise.  He hopes to return soon.   Out since last review  Donold has only attended twice since last review. He is limited by his transportation.  However, he would benefit more if he could attend more regularly. He is up to level 5 on the NuStep. We will continue to monitor his progress.   Reviewed home exercise with pt today.  Pt plans to walk in drive way and do chair exercises for exercise.  Reviewed THR, pulse, RPE, sign and symptoms, and when to call 911 or MD.  Also discussed weather considerations and indoor options.  Pt voiced understanding.   Expected Outcomes  Short: Use RPE daily to regulate intensity. Long: Follow program prescription in THR.  Short: Attend class regularly.  Long: Continue to follow program prescription.   -  Short: Continue to increase  endurance and attend regularly.  Long: Continue to increase strength and stamina.   Short: Start to move more at home.  Long: Continue to increase physcial activity.    [place] Name 06/09/18 1243 06/25/18 1032 07/06/18 1544 07/21/18 1313 07/31/18 1217     Exercise Goal Re-Evaluation   Exercise Goals Review  Increase Physical Activity;Increase Strength and Stamina;Understanding of Exercise Prescription  Increase Physical Activity;Increase Strength and Stamina;Able to understand and use rate of perceived exertion (RPE) scale;Able to understand and use Dyspnea scale  -  Increase Physical Activity;Increase Strength and Stamina;Understanding of Exercise Prescription  Increase Physical Activity;Increase Strength and Stamina   Comments  [name] has only attended once since last review.  He really needs to try to get here more often to make more progression. We will continue to monitor his progress.   [name] has only attended three sessions in December.  Regular attendance is necessary for progression.  Out since last review  [name] has only had one visit since last review.  He cannot make progress without coming consistently.  We  will continue to encourage improved attendance.   Patient has not been exercising outside of rehab. He was adjusting medications and had doctors appointments and will try to get to class regularly.   Expected Outcomes  Short: Attend more regularly.  Long: Continue to move more at home.   Short - attend class 2-3 days per week Long - improve MET level  -  Short: Get to class regularly!!  Long: Continue to work on increasing physcial activity.   Short: Get to class regularly.  Long: Continue to work on increasing physcial activity.    [place] Name 08/03/18 1546 08/18/18 1409           Exercise Goal Re-Evaluation   Exercise Goals Review  Increase Physical Activity;Increase Strength and Stamina  Increase Physical Activity;Increase Strength and Stamina      Comments  [name] continues to have poor attendance which inhibits his progress. We will continue to monitor his progress and encourage improved attendance.   [name] is still only attending once evey two weeks.  He has not made any progress as a result of his poor attendance.  We will continue to encourage attendance and monitor his progress.       Expected Outcomes  Short: Attend regularly.  Long: Continue to increase strength and stamina.   Short: Attend regularly.  Long: Continue to increase strength and stamina.          Discharge Exercise Prescription (Final Exercise Prescription Changes): Exercise Prescription Changes - 08/18/18 1400      Response to Exercise   Blood Pressure (Admit)  110/70    Blood Pressure (Exit)  120/80    Heart Rate (Admit)  51 bpm    Heart Rate (Exercise)  95 bpm    Heart Rate (Exit)  62 bpm    Oxygen Saturation (Admit)  93 %    Oxygen Saturation (Exercise)  88 %    Oxygen Saturation (Exit)  89 %    Rating of Perceived Exertion (Exercise)  15    Perceived Dyspnea (Exercise)  2    Symptoms  none    Duration  Progress to 45 minutes of aerobic exercise without signs/symptoms of physical distress    Intensity  THRR unchanged       Progression   Progression  Continue to progress workloads to maintain intensity without signs/symptoms of physical distress.    Average METs  1.67  Resistance Training   Training Prescription  Yes    Weight  4 lbs    Reps  10-15      Interval Training   Interval Training  No      Treadmill   MPH  0.5    Grade  0    Minutes  15    METs  1.4      Recumbant Elliptical   Level  1    Minutes  15    METs  1.6      T5 Nustep   Level  1    Minutes  15    METs  2      Home Exercise Plan   Plans to continue exercise at  Home (comment)   walk, chair exercises   Frequency  Add 1 additional day to program exercise sessions.    Initial Home Exercises Provided  06/03/18       Nutrition:  Target Goals: Understanding of nutrition guidelines, daily intake of sodium '1500mg'$ , cholesterol '200mg'$ , calories 30% from fat and 7% or less from saturated fats, daily to have 5 or more servings of fruits and vegetables.  Biometrics: Pre Biometrics - 03/17/18 1527      Pre Biometrics   Height  6' 1.5" (1.867 m)    Weight  (!) 313 lb 3.2 oz (142.1 kg)    Waist Circumference  53 inches    Hip Circumference  50 inches    Waist to Hip Ratio  1.06 %    BMI (Calculated)  40.76    Single Leg Stand  0 seconds        Nutrition Therapy Plan and Nutrition Goals: Nutrition Therapy & Goals - 04/13/18 1358      Nutrition Therapy   Diet  DM    Drug/Food Interactions  Statins/Certain Fruits    Protein (specify units)  12-13oz    Fiber  35 grams    Whole Grain Foods  3 servings   eats white-wheat or rye bread   Saturated Fats  16 max. grams    Fruits and Vegetables  6 servings/day   8 ideal; eats more fruits than vegetables   Sodium  2000 grams   does not need to pt [name] doctor to tell pt not to worry about monitoring his sodium intake currently     Personal Nutrition Goals   Nutrition Goal  Keep intake of non-nutritious liquid calories IE beer to a minimum. Try to reduce daily  consumption from 3-4/day to 1-2/day as an initial goal    Personal Goal #2  Choose evening snacks that are low sugar or zero sugar as opposed to pies and cakes. For example, sugar free pudding or fruit with yogurt    Comments  He has made several changes to his eating practices which has resulted in wt loss of 64# x2 years. BG control has improved, most recent 3 month average 130; HgbA1c 6.7. He has started to eat "lighter" meals and no longer drinks sodas. He has reduced frequency of eating food out of the home from several times per week to zero in the past several months. Drinks water and unsweetened iced tea for beverages, but also typically drinks 3-4 beers per day. Breakfast: oatmeal with fruit and honey, Lunch: apple and cheese, Dinner: soup & sandwich, occasional frozen pizza, beef stew. Typically has a sweet evening snack such as pie but has also tried some sugar free dessert options. Does not eat many vegetables at this time and  does not feel that he eats a lot of meat      Intervention Plan   Intervention  Prescribe, educate and counsel regarding individualized specific dietary modifications aiming towards targeted core components such as weight, hypertension, lipid management, diabetes, heart failure and other comorbidities.    Expected Outcomes  Short Term Goal: Understand basic principles of dietary content, such as calories, fat, sodium, cholesterol and nutrients.;Short Term Goal: A plan has been developed with personal nutrition goals set during dietitian appointment.;Long Term Goal: Adherence to prescribed nutrition plan.       Nutrition Assessments:   Nutrition Goals Re-Evaluation: Nutrition Goals Re-Evaluation    [place] Name 04/13/18 1424 05/13/18 1239 07/08/18 1215 07/31/18 1229       Goals   Current Weight  -  -  -  296 lb (134.3 kg)    Nutrition Goal  Keep intake of non-nutritious liquid calories IE beer to a minimum. Try to reduce daily consumption from 3-4/day to 1-2/day as  an initial goal  Keep intake of non-nutritious liquid calories IE beer to a minimum by starting to reduce the number of beers you drink per day; Choose evening snacks that are low or zero sugar more often  Keep intake of non-nutritious calories IE beer to a minimum/ start to decrease; Choose evening snacks that are low or zero sugar more often   Lose weight and eat 3-5 smaller meals.    Comment  He has removed all other liquid calories such as soda and sweet tea from his diet but continues to drink alcohol daily. He desires to lose another 10-20#  He has lost 10-12# this month, wt loss was desired but amount was unexpeced per pt. He feels that he has had less fluid retention in his BLE which may account for some wt loss in addition to continued dietary interventions. He has continued to work on reducing the number of alcoholic beverages he drinks each week and does not drink other sugar sweetened beverages. He has also been minimizing snack intake, though sometimes still chooses cookies or pie "if it's around" in the evenings. Eating 2 meals/day typically and is including vegetables  He [name] his wt loss not to be as drastic. CBW 297# with ultimate goal wt of 225#. He has continued to reduce snacking in the evenings, but continues to struggle with this at times. He has made a new goal not to eat after 9pm. He has also been started on Zoloft which he is hopeful will help him be more adherent to his health goals. He continues to drink beer semi often  [place] has been trying to keep his portion sizes down. He does not eat junk food but eats breakfast and dinner. He drinks water, ice tea and seldom drinks soday. He states ice cream is his weekness and eats it 2-3 times a week.     Expected Outcome  He will reduce daily alcohol consumption until he is having an alcoholic beverage only sparingly rather than multiple each day  He will monitor wt more closely and be aware of any edema to BLE. He will continue to work on  reducing alcohol intake.  Continue to work on decreasing alcohol intake and to integrate healthy habits that facilitate weight loss towards ultimate wt loss goal. Choose low / zero sugar snack options and continue to eat at home most often.   Short: eat lunch and smaller meals. Long: lose weight in the next two weeks following a better diet.  Personal Goal #2 Re-Evaluation   Personal Goal #2  Choose evening snacks that are low sugar or zero sugar as opposed to pies and cakes. For example, sugar free pudding or fruit with yogurt  -  -  -       Nutrition Goals Discharge (Final Nutrition Goals Re-Evaluation): Nutrition Goals Re-Evaluation - 07/31/18 1229      Goals   Current Weight  296 lb (134.3 kg)    Nutrition Goal  Lose weight and eat 3-5 smaller meals.    Comment  [name] has been trying to keep his portion sizes down. He does not eat junk food but eats breakfast and dinner. He drinks water, ice tea and seldom drinks soday. He states ice cream is his weekness and eats it 2-3 times a week.     Expected Outcome  Short: eat lunch and smaller meals. Long: lose weight in the next two weeks following a better diet.       Psychosocial: Target Goals: Acknowledge presence or absence of significant depression and/or stress, maximize coping skills, provide positive support [name]. Participant is able to verbalize types and ability to use techniques and skills needed for reducing stress and depression.   Initial Review & Psychosocial Screening: Initial Psych Review & Screening - 03/17/18 1520      Initial Review   Current issues with  Current Depression;History of Depression;Current Stress Concerns    Source of Stress Concerns  Chronic Illness;Unable to perform yard/household activities;Financial    Comments  Financial stress and relationship stress. He is unable to do things that he used to do.      Family Dynamics   Good Support [name]?  Yes    Comments  He can look to his brothers and his  fiance. His mother is also supportive for him.      Barriers   Psychosocial barriers to participate in program  The patient should benefit from training in stress management and relaxation.      Screening Interventions   Interventions  Encouraged to exercise;To provide support and resources with identified psychosocial needs;Provide feedback about the scores to participant;Program counselor consult    Expected Outcomes  Short Term goal: Utilizing psychosocial counselor, staff and physician to assist with identification of specific Stressors or current issues interfering with healing process. Setting desired goal for each stressor or current issue identified.;Long Term Goal: Stressors or current issues are controlled or eliminated.;Short Term goal: Identification and review with participant of any Quality of Life or Depression concerns found by scoring the questionnaire.;Long Term goal: The participant improves quality of Life and PHQ9 Scores as seen by post scores and/or verbalization of changes       Quality of Life Scores:  Scores of 19 and below usually indicate a poorer quality of life in these areas.  A difference of  2-3 points is a clinically meaningful difference.  A difference of 2-3 points in the total score of the Quality of Life Index has been associated with significant improvement in overall quality of life, self-image, physical symptoms, and general health in studies assessing change in quality of life.  PHQ-9: Recent Review Flowsheet Data    Depression screen [facility] 2/9 03/17/2018   Decreased Interest 3   Down, Depressed, Hopeless 2   PHQ - 2 Score 5   Altered sleeping 2   Tired, decreased energy 2   Change in appetite 1   Feeling bad or failure about yourself  2   Trouble concentrating 1  Moving slowly or fidgety/restless 1   Suicidal thoughts 0   PHQ-9 Score 14   Difficult doing work/chores Very difficult     Interpretation of Total Score  Total Score Depression  Severity:  1-4 = Minimal depression, 5-9 = Mild depression, 10-14 = Moderate depression, 15-19 = Moderately severe depression, 20-27 = Severe depression   Psychosocial Evaluation and Intervention: Psychosocial Evaluation - 04/13/18 1215      Psychosocial Evaluation & Interventions   Interventions  Stress management education;Relaxation education;Encouraged to exercise with the program and follow exercise prescription;Therapist referral    Comments  Counselor met with [name]) today for initial psychsoocial evaluation.  He is a 58 year old who has multiple health issues including a recent polyp removed from his airway.  [name] has had a trache for the past 9 years and also struggles with diabetes; neuropathy; GERD; CHF; obesity.  He sleeps well with a ventillator although he has OSA. [name] with a partner of 14 years; and a mother who lives locally and brothers who stay in touch by phone.  He has a good appetite.  [name] a history of depression and anxiety and is on medications for these currently.  He states he is currently depressed and would like to see a counselor - if possible.  Counselor reviewed the PHQ-9 with [name] score of "14" - which indicated moderate symptoms for depression.  Additionally he has multiple stressors with his health and finances.  He would like to improve his breathing while in this program.  Counselor provided contact information to [name] for local therapist options to see if they are taking new patients.  Counselor will follow with him about this.     Expected Outcomes  Short:  Michaell will contact a therapist to begin treatment for depressive symptoms.  [name] will exercise consistently in this program for his health and mental health - particularly positive coping strategies.  Long:  [place] will develop a healthy lifestyle routine of exercise, diet and positive coping strategies for his health and mental health.     Continue Psychosocial Services    Follow up required by counselor       Psychosocial Re-Evaluation: Psychosocial Re-Evaluation    [place] Name 06/03/18 1511 07/31/18 1222           Psychosocial Re-Evaluation   Current issues with  Current Depression;History of Depression;Current Stress Concerns  Current Sleep Concerns;Current Stress Concerns;Current Depression;History of Depression      Comments  Patient states that he sometimes does not make it to therapy because he is to depressed, He states that until now he has been prescribed and anti depressant. He states he has to pick it up today, Informed him that it may be good for him to take it and see if it helps him. He gets beside himself when he thinks about his health issues.  He states he does not sleep regularly and will nap during the day. Part of the issue he says is with his medications. His medications make him tired and he is tryng to adjust. His new medications are making him feel a little better. The trip getting her is hard to do. He feels lazy and his mother and fiance are the ones driving him here. He is working on getting his license so he can drive again. His life and being shut in being 58 year old with health issues making him depressed. He has been having issues for 15  years and feels like a failure since he cannot do alot for himself.      Expected Outcomes  Short: pick up his anti depressant medication. Long: take anti depressant medication regularly.  Short: attend [facility] regularly to improve mood. Long: Keep exercsing and get out of the house more and be more productive  His family is supportive but do not  know the concept of his disease process.      Interventions  Encouraged to attend Pulmonary Rehabilitation for the exercise  Encouraged to attend Pulmonary Rehabilitation for the exercise;Relaxation education;Stress management education      Continue Psychosocial Services   Follow up required by staff  Follow up required by staff         Psychosocial  Discharge (Final Psychosocial Re-Evaluation): Psychosocial Re-Evaluation - 07/31/18 1222      Psychosocial Re-Evaluation   Current issues with  Current Sleep Concerns;Current Stress Concerns;Current Depression;History of Depression    Comments  He states he does not sleep regularly and will nap during the day. Part of the issue he says is with his medications. His medications make him tired and he is tryng to adjust. His new medications are making him feel a little better. The trip getting her is hard to do. He feels lazy and his mother and fiance are the ones driving him here. He is working on getting his license so he can drive again. His life and being shut in being 58 year old with health issues making him depressed. He has been having issues for 15 years and feels like a failure since he cannot do alot for himself.    Expected Outcomes  Short: attend [facility] regularly to improve mood. Long: Keep exercsing and get out of the house more and be more productive  His family is supportive but do not  know the concept of his disease process.    Interventions  Encouraged to attend Pulmonary Rehabilitation for the exercise;Relaxation education;Stress management education    Continue Psychosocial Services   Follow up required by staff       Education: Education Goals: Education classes will be provided on a weekly basis, covering required topics. Participant will state understanding/return demonstration of topics presented.  Learning Barriers/Preferences: Learning Barriers/Preferences - 03/17/18 1525      Learning Barriers/Preferences   Learning Barriers  None    Learning Preferences  None       Education Topics:  Initial Evaluation Education: - Verbal, written and demonstration of respiratory meds, oximetry and breathing techniques. Instruction on use of nebulizers and MDIs and importance of monitoring MDI activations.   Pulmonary Rehab from 07/31/2018 in [facility] Cardiac and Pulmonary Rehab   Date  03/17/18  Educator  [facility]  Instruction Review Code  1- Verbalizes Understanding      General Nutrition Guidelines/Fats and Fiber: -Group instruction provided by verbal, written material, models and posters to present the general guidelines for heart healthy nutrition. Gives an explanation and review of dietary fats and fiber.   Controlling Sodium/Reading Food Labels: -Group verbal and written material supporting the discussion of sodium use in heart healthy nutrition. Review and explanation with models, verbal and written materials for utilization of the food label.   Pulmonary Rehab from 07/31/2018 in [facility] Cardiac and Pulmonary Rehab  Date  05/13/18  Educator  [facility]  Instruction Review Code  1- Verbalizes Understanding      Exercise Physiology & General Exercise Guidelines: - Group verbal and written instruction with models to review the exercise  physiology of the cardiovascular [name] and associated critical values. Provides general exercise guidelines with specific guidelines to those with heart or lung disease.    Aerobic Exercise & Resistance Training: - Gives group verbal and written instruction on the various components of exercise. Focuses on aerobic and resistive training programs and the benefits of this training and how to safely progress through these programs.   Flexibility, Balance, Mind/Body Relaxation: Provides group verbal/written instruction on the benefits of flexibility and balance training, including mind/body exercise modes such as yoga, pilates and tai chi.  Demonstration and skill practice provided.   Pulmonary Rehab from 07/31/2018 in [facility] Cardiac and Pulmonary Rehab  Date  04/15/18  Educator  [facility]  Instruction Review Code  1- Verbalizes Understanding      Stress and Anxiety: - Provides group verbal and written instruction about the health risks of elevated stress and causes of high stress.  Discuss the correlation between heart/lung disease and anxiety and  treatment options. Review healthy ways to manage with stress and anxiety.   Depression: - Provides group verbal and written instruction on the correlation between heart/lung disease and depressed mood, treatment options, and the stigmas associated with seeking treatment.   Exercise & Equipment Safety: - Individual verbal instruction and demonstration of equipment use and safety with use of the equipment.   Pulmonary Rehab from 07/31/2018 in [facility] Cardiac and Pulmonary Rehab  Date  03/17/18  Educator  [facility]  Instruction Review Code  1- Verbalizes Understanding      Infection Prevention: - Provides verbal and written material to individual with discussion of infection control including proper hand washing and proper equipment cleaning during exercise session.   Pulmonary Rehab from 07/31/2018 in [facility] Cardiac and Pulmonary Rehab  Date  03/17/18  Educator  [facility]  Instruction Review Code  1- Verbalizes Understanding      Falls Prevention: - Provides verbal and written material to individual with discussion of falls prevention and safety.   Pulmonary Rehab from 07/31/2018 in [facility] Cardiac and Pulmonary Rehab  Date  03/17/18  Educator  [facility]  Instruction Review Code  1- Verbalizes Understanding      Diabetes: - Individual verbal and written instruction to review signs/symptoms of diabetes, desired ranges of glucose level fasting, after meals and with exercise. Advice that pre and post exercise glucose checks will be done for 3 sessions at entry of program.   Chronic Lung Diseases: - Group verbal and written instruction to review updates, respiratory medications, advancements in procedures and treatments. Discuss use of supplemental oxygen including available portable oxygen systems, continuous and intermittent flow rates, concentrators, personal use and safety guidelines. Review proper use of inhaler and spacers. Provide informative websites for self-education.    Pulmonary Rehab from 07/31/2018 in  [facility] Cardiac and Pulmonary Rehab  Date  07/31/18  Educator  [facility]  Instruction Review Code  1- Verbalizes Understanding      Energy Conservation: - Provide group verbal and written instruction for methods to conserve energy, plan and organize activities. Instruct on pacing techniques, use of adaptive equipment and posture/positioning to relieve shortness of breath.   Triggers and Exacerbations: - Group verbal and written instruction to review types of environmental triggers and ways to prevent exacerbations. Discuss weather changes, air quality and the benefits of nasal washing. Review warning signs and symptoms to help prevent infections. Discuss techniques for effective airway clearance, coughing, and vibrations.   AED/CPR: - Group verbal and written instruction with the use of models to demonstrate the  basic use of the AED with the basic ABC's of resuscitation.   Pulmonary Rehab from 07/31/2018 in [facility] Cardiac and Pulmonary Rehab  Date  07/08/18  Educator  [facility]  Instruction Review Code  1- Actuary and Physiology of the Lungs: - Group verbal and written instruction with the use of models to provide basic lung anatomy and physiology related to function, structure and complications of lung disease.   Anatomy & Physiology of the Heart: - Group verbal and written instruction and models provide basic cardiac anatomy and physiology, with the coronary electrical and arterial systems. Review of Valvular disease and Heart Failure   Cardiac Medications: - Group verbal and written instruction to review commonly prescribed medications for heart disease. Reviews the medication, class of the drug, and side effects.   Know Your Numbers and Risk Factors: -Group verbal and written instruction about important numbers in your health.  Discussion of what are risk factors and how they play a role in the disease process.  Review of Cholesterol, Blood Pressure, Diabetes, and  BMI and the role they play in your overall health.   Pulmonary Rehab from 07/31/2018 in [facility] Cardiac and Pulmonary Rehab  Date  06/10/18  Educator  [facility]  Instruction Review Code  1- Verbalizes Understanding      Sleep Hygiene: -Provides group verbal and written instruction about how sleep can affect your health.  Define sleep hygiene, discuss sleep cycles and impact of sleep habits. Review good sleep hygiene tips.    Pulmonary Rehab from 07/31/2018 in [facility] Cardiac and Pulmonary Rehab  Date  06/03/18  Educator  [facility]  Instruction Review Code  1- Verbalizes Understanding      Other: -Provides group and verbal instruction on various topics (see comments)    Knowledge Questionnaire Score: Knowledge Questionnaire Score - 03/17/18 1452      Knowledge Questionnaire Score   Pre Score  16/18   REVIEWED WITH patient       Core Components/Risk Factors/Patient Goals at Admission: Personal Goals and Risk Factors at Admission - 03/17/18 1525      Core Components/Risk Factors/Patient Goals on Admission    Weight Management  Weight Loss    Intervention  Weight Management: Develop a combined nutrition and exercise program designed to reach desired caloric intake, while maintaining appropriate intake of nutrient and fiber, sodium and fats, and appropriate energy expenditure required for the weight goal.;Weight Management/Obesity: Establish reasonable short term and long term weight goals.;Weight Management: Provide education and appropriate resources to help participant work on and attain dietary goals.    Admit Weight  313 lb 3.2 oz (142.1 kg)    Goal Weight: Short Term  308 lb (139.7 kg)    Goal Weight: Long Term  290 lb (131.5 kg)    Expected Outcomes  Short Term: Continue to assess and modify interventions until short term weight is achieved;Long Term: Adherence to nutrition and physical activity/exercise program aimed toward attainment of established weight goal;Understanding recommendations for  meals to include 15-35% energy as protein, 25-35% energy from fat, 35-60% energy from carbohydrates, less than '200mg'$  of dietary cholesterol, 20-35 gm of total fiber daily;Understanding of distribution of calorie intake throughout the day with the consumption of 4-5 meals/snacks;Weight Loss: Understanding of general recommendations for a balanced deficit meal plan, which promotes 1-2 lb weight loss per week and includes a negative energy balance of [phone] kcal/d    Improve shortness of breath with ADL's  Yes  Intervention  Provide education, individualized exercise plan and daily activity instruction to help decrease symptoms of SOB with activities of daily living.    Expected Outcomes  Short Term: Improve cardiorespiratory fitness to achieve a reduction of symptoms when performing ADLs;Long Term: Be able to perform more ADLs without symptoms or delay the onset of symptoms    Diabetes  Yes    Intervention  Provide education about signs/symptoms and action to take for hypo/hyperglycemia.;Provide education about proper nutrition, including hydration, and aerobic/resistive exercise prescription along with prescribed medications to achieve blood glucose in normal ranges: Fasting glucose 65-99 mg/dL    Expected Outcomes  Short Term: Participant verbalizes understanding of the signs/symptoms and immediate care of hyper/hypoglycemia, proper foot care and importance of medication, aerobic/resistive exercise and nutrition plan for blood glucose control.;Long Term: Attainment of HbA1C < 7%.   his A1c was 6.5 he states   Heart Failure  Yes   pace maker   Intervention  Provide a combined exercise and nutrition program that is supplemented with education, support and counseling about heart failure. Directed toward relieving symptoms such as shortness of breath, decreased exercise tolerance, and extremity edema.    Expected Outcomes  Improve functional capacity of life;Short term: Attendance in program 2-3 days a  week with increased exercise capacity. Reported lower sodium intake. Reported increased fruit and vegetable intake. [name] medication compliance.;Short term: Daily weights obtained and reported for increase. Utilizing diuretic protocols set by physician.;Long term: Adoption of self-care skills and reduction of barriers for early signs and symptoms recognition and intervention leading to self-care maintenance.    Hypertension  Yes    Intervention  Provide education on lifestyle modifcations including regular physical activity/exercise, weight management, moderate sodium restriction and increased consumption of fresh fruit, vegetables, and low fat dairy, alcohol moderation, and smoking cessation.;Monitor prescription use compliance.    Expected Outcomes  Short Term: Continued assessment and intervention until BP is < 140/53m HG in hypertensive participants. < 130/838mHG in hypertensive participants with diabetes, heart failure or chronic kidney disease.;Long Term: Maintenance of blood pressure at goal levels.    Lipids  Yes    Intervention  Provide education and support for participant on nutrition & aerobic/resistive exercise along with prescribed medications to achieve LDL '70mg'$ , HDL >'40mg'$ .    Expected Outcomes  Short Term: Participant states understanding of desired cholesterol values and is compliant with medications prescribed. Participant is following exercise prescription and nutrition guidelines.;Long Term: Cholesterol controlled with medications as prescribed, with individualized exercise RX and with personalized nutrition plan. Value goals: LDL < '70mg'$ , HDL > 40 mg.       Core Components/Risk Factors/Patient Goals Review:  Goals and Risk Factor Review    Row Name 06/03/18 1515 07/31/18 1235           Core Components/Risk Factors/Patient Goals Review   Personal Goals Review  Weight Management/Obesity;Improve shortness of breath with ADL's;Heart Failure;Diabetes;Lipids  Weight  Management/Obesity;Improve shortness of breath with ADL's;Heart Failure;Diabetes;Lipids      Review  [name]'s attendance has been sporadic since he suffers from depression. He states that after he exercises he feels better. Informed him that he has to hold on to that feeling he gets when he exercises and continue moving forward.  Patient has not attended class in three weeks or more. He is going to try to come more regularly to class to improve his overall health. He states that he had labs drawn recently and states that they were good.  Expected Outcomes  Short: Attend [facility] regularly to improve shortness of breath with ADL's. Long: maintain independence with ADL's   Short: Attend [facility] regularly to improve shortness of breath with ADL's. Long: maintain independence with ADL's          Core Components/Risk Factors/Patient Goals at Discharge (Final Review):  Goals and Risk Factor Review - 07/31/18 1235      Core Components/Risk Factors/Patient Goals Review   Personal Goals Review  Weight Management/Obesity;Improve shortness of breath with ADL's;Heart Failure;Diabetes;Lipids    Review  Patient has not attended class in three weeks or more. He is going to try to come more regularly to class to improve his overall health. He states that he had labs drawn recently and states that they were good.     Expected Outcomes  Short: Attend [facility] regularly to improve shortness of breath with ADL's. Long: maintain independence with ADL's        ITP Comments: ITP Comments    Row Name 03/17/18 1446 04/13/18 0855 04/28/18 1509 05/11/18 0841 05/13/18 1127   ITP Comments  Medical Evaluation completed. Chart sent for review and changes to Dr. Emily Filbert Director of [place]. Diagnosis can be found in [facility] encounter 02/27/18  30 day review completed. ITP sent to Dr. Emily Filbert Director of [place]. Continue with ITP unless changes are made by physician.  Called to check on pt.  He has been out since  04/15/18.  He said that last week he was out sick and that on Monday he couldn't get a ride scheduled.  He has a ride already set up for tomorrow and hopes to be here then.    30 day review completed. ITP sent to Dr. Emily Filbert Director of [place]. Continue with ITP unless changes are made by physician.  Othel returned today after being out for almost a month   [place] Name 06/08/18 0843 07/06/18 0843 07/21/18 1359 08/03/18 0836 08/18/18 1432   ITP Comments  30 day review completed. ITP sent to Dr. Emily Filbert Director of [place]. Continue with ITP unless changes are made by physician.  Patient has not been to [place] to obtain goals. 30 day review completed. ITP sent to Dr. Emily Filbert Director of [place]. Continue with ITP unless changes are made by physician.  Patient has not been in [place] since 07/08/2018. Left message for patient.  30 day review completed. ITP sent to Dr. Emily Filbert Director of [place]. Continue with ITP unless changes are made by physician.  Spoke to patient about his lack of attendance and that he has not progressed since the start of the program on 03/17/2018. Informed patient about the [employer] program and will send him information. Patient verbalizes understanding and discharged.      Comments: Discharge ITP

## 2018-08-18 NOTE — Progress Notes (Signed)
Discharge Progress Report  Patient Details  Name: [name] MRN: [id number] Date of Birth: June 28, 1960 Referring Provider:     Pulmonary Rehab from 03/17/2018 in [facility]  Referring Provider  [name] MD       Number of Visits: 11/36  Reason for Discharge:  Early Exit:  Lack of attendance  Smoking History:  Social History   Tobacco Use  Smoking Status Never Smoker  Smokeless Tobacco Never Used    Diagnosis:  Chronic respiratory failure, unspecified whether with hypoxia or hypercapnia ([place])  ADL UCSD: Pulmonary Assessment Scores    Row Name 03/17/18 1451         ADL [facility]SD   ADL Phase  Entry     SOB Score total  63     Rest  2     Walk  2     Stairs  4     Bath  1     Dress  1     Shop  3       CAT Score   CAT Score  25       mMRC Score   mMRC Score  2        Initial Exercise Prescription: Initial Exercise Prescription - 03/17/18 1500      Date of Initial Exercise RX and Referring Provider   Date  03/17/18    Referring Provider  [name] MD      Oxygen   Oxygen  Continuous    Liters  2      Treadmill   MPH  0.8    Grade  0    Minutes  15    METs  1.6      Recumbant Elliptical   Level  1    RPM  50    Minutes  15    METs  1.6      T5 Nustep   Level  1    SPM  80    Minutes  15    METs  1.6      Prescription Details   Frequency (times per week)  3    Duration  Progress to 45 minutes of aerobic exercise without signs/symptoms of physical distress      Intensity   THRR 40-80% of Max Heartrate  102-144    Ratings of Perceived Exertion  11-13    Perceived Dyspnea  0-4      Progression   Progression  Continue to progress workloads to maintain intensity without signs/symptoms of physical distress.      Resistance Training   Training Prescription  Yes    Weight  4 lbs    Reps  10-15       Discharge Exercise Prescription (Final Exercise Prescription Changes): Exercise  Prescription Changes - 08/18/18 1400      Response to Exercise   Blood Pressure (Admit)  110/70    Blood Pressure (Exit)  120/80    Heart Rate (Admit)  51 bpm    Heart Rate (Exercise)  95 bpm    Heart Rate (Exit)  62 bpm    Oxygen Saturation (Admit)  93 %    Oxygen Saturation (Exercise)  88 %    Oxygen Saturation (Exit)  89 %    Rating of Perceived Exertion (Exercise)  15    Perceived Dyspnea (Exercise)  2    Symptoms  none    Duration  Progress to 45 minutes of aerobic exercise without signs/symptoms of  physical distress    Intensity  THRR unchanged      Progression   Progression  Continue to progress workloads to maintain intensity without signs/symptoms of physical distress.    Average METs  1.67      Resistance Training   Training Prescription  Yes    Weight  4 lbs    Reps  10-15      Interval Training   Interval Training  No      Treadmill   MPH  0.5    Grade  0    Minutes  15    METs  1.4      Recumbant Elliptical   Level  1    Minutes  15    METs  1.6      T5 Nustep   Level  1    Minutes  15    METs  2      Home Exercise Plan   Plans to continue exercise at  Home (comment)   walk, chair exercises   Frequency  Add 1 additional day to program exercise sessions.    Initial Home Exercises Provided  06/03/18       Functional Capacity: 6 Minute Walk    Row Name 03/17/18 1520         6 Minute Walk   Phase  Initial     Distance  525 feet used rollator     Walk Time  6 minutes     # of Rest Breaks  0     MPH  0.99     METS  1.65     RPE  12     Perceived Dyspnea   1     VO2 Peak  5.78     Symptoms  Yes (comment)     Comments  arms got tired, legs got tight, slightly dizzy headed     Resting HR  61 bpm     Resting BP  124/72     Resting Oxygen Saturation   92 %     Exercise Oxygen Saturation  during 6 min walk  80 %     Max Ex. HR  100 bpm     Max Ex. BP  146/64     2 Minute Post BP  136/72       Interval HR   1 Minute HR  75     2 Minute  HR  80     3 Minute HR  91     4 Minute HR  98     5 Minute HR  100     6 Minute HR  90     2 Minute Post HR  60     Interval Heart Rate?  Yes       Interval Oxygen   Interval Oxygen?  Yes     Baseline Oxygen Saturation %  92 %     1 Minute Oxygen Saturation %  83 %     1 Minute Liters of Oxygen  0 L Room Air     2 Minute Oxygen Saturation %  82 %     2 Minute Liters of Oxygen  0 L     3 Minute Oxygen Saturation %  82 %     3 Minute Liters of Oxygen  0 L     4 Minute Oxygen Saturation %  82 %     4 Minute Liters of Oxygen  0 L  5 Minute Oxygen Saturation %  80 %     5 Minute Liters of Oxygen  0 L     6 Minute Oxygen Saturation %  82 %     6 Minute Liters of Oxygen  0 L     2 Minute Post Oxygen Saturation %  85 %     2 Minute Post Liters of Oxygen  0 L        Psychological, QOL, Others - Outcomes: PHQ 2/9: Depression screen PHQ 2/9 03/17/2018  Decreased Interest 3  Down, Depressed, Hopeless 2  PHQ - 2 Score 5  Altered sleeping 2  Tired, decreased energy 2  Change in appetite 1  Feeling bad or failure about yourself  2  Trouble concentrating 1  Moving slowly or fidgety/restless 1  Suicidal thoughts 0  PHQ-9 Score 14  Difficult doing work/chores Very difficult    Quality of Life:   Personal Goals: Goals established at orientation with interventions provided to work toward goal. Personal Goals and Risk Factors at Admission - 03/17/18 1525      Core Components/Risk Factors/Patient Goals on Admission    Weight Management  Weight Loss    Intervention  Weight Management: Develop a combined nutrition and exercise program designed to reach desired caloric intake, while maintaining appropriate intake of nutrient and fiber, sodium and fats, and appropriate energy expenditure required for the weight goal.;Weight Management/Obesity: Establish reasonable short term and long term weight goals.;Weight Management: Provide education and appropriate resources to help participant  work on and attain dietary goals.    Admit Weight  313 lb 3.2 oz (142.1 kg)    Goal Weight: Short Term  308 lb (139.7 kg)    Goal Weight: Long Term  290 lb (131.5 kg)    Expected Outcomes  Short Term: Continue to assess and modify interventions until short term weight is achieved;Long Term: Adherence to nutrition and physical activity/exercise program aimed toward attainment of established weight goal;Understanding recommendations for meals to include 15-35% energy as protein, 25-35% energy from fat, 35-60% energy from carbohydrates, less than '200mg'$  of dietary cholesterol, 20-35 gm of total fiber daily;Understanding of distribution of calorie intake throughout the day with the consumption of 4-5 meals/snacks;Weight Loss: Understanding of general recommendations for a balanced deficit meal plan, which promotes 1-2 lb weight loss per week and includes a negative energy balance of [phone] kcal/d    Improve shortness of breath with ADL's  Yes    Intervention  Provide education, individualized exercise plan and daily activity instruction to help decrease symptoms of SOB with activities of daily living.    Expected Outcomes  Short Term: Improve cardiorespiratory fitness to achieve a reduction of symptoms when performing ADLs;Long Term: Be able to perform more ADLs without symptoms or delay the onset of symptoms    Diabetes  Yes    Intervention  Provide education about signs/symptoms and action to take for hypo/hyperglycemia.;Provide education about proper nutrition, including hydration, and aerobic/resistive exercise prescription along with prescribed medications to achieve blood glucose in normal ranges: Fasting glucose 65-99 mg/dL    Expected Outcomes  Short Term: Participant verbalizes understanding of the signs/symptoms and immediate care of hyper/hypoglycemia, proper foot care and importance of medication, aerobic/resistive exercise and nutrition plan for blood glucose control.;Long Term: Attainment of  HbA1C < 7%.   his A1c was 6.5 he states   Heart Failure  Yes   pace maker   Intervention  Provide a combined exercise and nutrition program that is supplemented  with education, support and counseling about heart failure. Directed toward relieving symptoms such as shortness of breath, decreased exercise tolerance, and extremity edema.    Expected Outcomes  Improve functional capacity of life;Short term: Attendance in program 2-3 days a week with increased exercise capacity. Reported lower sodium intake. Reported increased fruit and vegetable intake. Reports medication compliance.;Short term: Daily weights obtained and reported for increase. Utilizing diuretic protocols set by physician.;Long term: Adoption of self-care skills and reduction of barriers for early signs and symptoms recognition and intervention leading to self-care maintenance.    Hypertension  Yes    Intervention  Provide education on lifestyle modifcations including regular physical activity/exercise, weight management, moderate sodium restriction and increased consumption of fresh fruit, vegetables, and low fat dairy, alcohol moderation, and smoking cessation.;Monitor prescription use compliance.    Expected Outcomes  Short Term: Continued assessment and intervention until BP is < 140/41m HG in hypertensive participants. < 130/835mHG in hypertensive participants with diabetes, heart failure or chronic kidney disease.;Long Term: Maintenance of blood pressure at goal levels.    Lipids  Yes    Intervention  Provide education and support for participant on nutrition & aerobic/resistive exercise along with prescribed medications to achieve LDL <7045mHDL >52m62m  Expected Outcomes  Short Term: Participant states understanding of desired cholesterol values and is compliant with medications prescribed. Participant is following exercise prescription and nutrition guidelines.;Long Term: Cholesterol controlled with medications as prescribed, with  individualized exercise RX and with personalized nutrition plan. Value goals: LDL < 70mg2mL > 40 mg.        Personal Goals Discharge: Goals and Risk Factor Review    Row Name 06/03/18 1515 07/31/18 1235           Core Components/Risk Factors/Patient Goals Review   Personal Goals Review  Weight Management/Obesity;Improve shortness of breath with ADL's;Heart Failure;Diabetes;Lipids  Weight Management/Obesity;Improve shortness of breath with ADL's;Heart Failure;Diabetes;Lipids      Review  Azeem's attendance has been sporadic since he suffers from depression. He states that after he exercises he feels better. Informed him that he has to hold on to that feeling he gets when he exercises and continue moving forward.  Patient has not attended class in three weeks or more. He is going to try to come more regularly to class to improve his overall health. He states that he had labs drawn recently and states that they were good.       Expected Outcomes  Short: Attend [facility] regularly to improve shortness of breath with ADL's. Long: maintain independence with ADL's   Short: Attend [facility] regularly to improve shortness of breath with ADL's. Long: maintain independence with ADL's          Exercise Goals and Review: Exercise Goals    Row Name 03/17/18 1526             Exercise Goals   Increase Physical Activity  Yes       Intervention  Provide advice, education, support and counseling about physical activity/exercise needs.;Develop an individualized exercise prescription for aerobic and resistive training based on initial evaluation findings, risk stratification, comorbidities and participant's personal goals.       Expected Outcomes  Short Term: Attend rehab on a regular basis to increase amount of physical activity.;Long Term: Add in home exercise to make exercise part of routine and to increase amount of physical activity.;Long Term: Exercising regularly at least 3-5 days a week.        Increase  Strength and Stamina  Yes       Intervention  Provide advice, education, support and counseling about physical activity/exercise needs.;Develop an individualized exercise prescription for aerobic and resistive training based on initial evaluation findings, risk stratification, comorbidities and participant's personal goals.       Expected Outcomes  Short Term: Increase workloads from initial exercise prescription for resistance, speed, and METs.;Short Term: Perform resistance training exercises routinely during rehab and add in resistance training at home;Long Term: Improve cardiorespiratory fitness, muscular endurance and strength as measured by increased METs and functional capacity (6MWT)       Able to understand and use rate of perceived exertion (RPE) scale  Yes       Intervention  Provide education and explanation on how to use RPE scale       Expected Outcomes  Short Term: Able to use RPE daily in rehab to express subjective intensity level;Long Term:  Able to use RPE to guide intensity level when exercising independently       Able to understand and use Dyspnea scale  Yes       Intervention  Provide education and explanation on how to use Dyspnea scale       Expected Outcomes  Short Term: Able to use Dyspnea scale daily in rehab to express subjective sense of shortness of breath during exertion;Long Term: Able to use Dyspnea scale to guide intensity level when exercising independently       Knowledge and understanding of Target Heart Rate Range (THRR)  Yes       Intervention  Provide education and explanation of THRR including how the numbers were predicted and where they are located for reference       Expected Outcomes  Short Term: Able to state/look up THRR;Short Term: Able to use daily as guideline for intensity in rehab;Long Term: Able to use THRR to govern intensity when exercising independently       Able to check pulse independently  Yes       Intervention  Provide education and  demonstration on how to check pulse in carotid and radial arteries.;Review the importance of being able to check your own pulse for safety during independent exercise       Expected Outcomes  Short Term: Able to explain why pulse checking is important during independent exercise;Long Term: Able to check pulse independently and accurately       Understanding of Exercise Prescription  Yes       Intervention  Provide education, explanation, and written materials on patient's individual exercise prescription       Expected Outcomes  Short Term: Able to explain program exercise prescription;Long Term: Able to explain home exercise prescription to exercise independently          Exercise Goals Re-Evaluation: Exercise Goals Re-Evaluation    Row Name 04/13/18 1153 04/28/18 1510 05/12/18 1409 05/25/18 1638 06/03/18 1353     Exercise Goal Re-Evaluation   Exercise Goals Review  Increase Physical Activity;Able to understand and use rate of perceived exertion (RPE) scale;Understanding of Exercise Prescription;Increase Strength and Stamina;Able to understand and use Dyspnea scale  Increase Physical Activity;Increase Strength and Stamina;Understanding of Exercise Prescription  -  Increase Physical Activity;Increase Strength and Stamina;Understanding of Exercise Prescription  Increase Physical Activity;Increase Strength and Stamina;Understanding of Exercise Prescription;Able to understand and use rate of perceived exertion (RPE) scale;Able to understand and use Dyspnea scale;Knowledge and understanding of Target Heart Rate Range (THRR);Able to check pulse independently   Comments  Reviewed  RPE scale, THR and program prescription with pt today.  Pt voiced understanding and was given a copy of goals to take home.   Dinero has only attended once since last review.  He has been out sick.  He has completed two full days of exercise.  He hopes to return soon.   Out since last review  Elward has only attended twice since last  review. He is limited by his transportation.  However, he would benefit more if he could attend more regularly. He is up to level 5 on the NuStep. We will continue to monitor his progress.   Reviewed home exercise with pt today.  Pt plans to walk in drive way and do chair exercises for exercise.  Reviewed THR, pulse, RPE, sign and symptoms, and when to call 911 or MD.  Also discussed weather considerations and indoor options.  Pt voiced understanding.   Expected Outcomes  Short: Use RPE daily to regulate intensity. Long: Follow program prescription in THR.  Short: Attend class regularly.  Long: Continue to follow program prescription.   -  Short: Continue to increase endurance and attend regularly.  Long: Continue to increase strength and stamina.   Short: Start to move more at home.  Long: Continue to increase physcial activity.    [place] Name 06/09/18 1243 06/25/18 1032 07/06/18 1544 07/21/18 1313 07/31/18 1217     Exercise Goal Re-Evaluation   Exercise Goals Review  Increase Physical Activity;Increase Strength and Stamina;Understanding of Exercise Prescription  Increase Physical Activity;Increase Strength and Stamina;Able to understand and use rate of perceived exertion (RPE) scale;Able to understand and use Dyspnea scale  -  Increase Physical Activity;Increase Strength and Stamina;Understanding of Exercise Prescription  Increase Physical Activity;Increase Strength and Stamina   Comments  Jameson has only attended once since last review.  He really needs to try to get here more often to make more progression. We will continue to monitor his progress.   Zi has only attended three sessions in December.  Regular attendance is necessary for progression.  Out since last review  Venson has only had one visit since last review.  He cannot make progress without coming consistently.  We will continue to encourage improved attendance.   Patient has not been exercising outside of rehab. He was adjusting medications and had  doctors appointments and will try to get to class regularly.   Expected Outcomes  Short: Attend more regularly.  Long: Continue to move more at home.   Short - attend class 2-3 days per week Long - improve MET level  -  Short: Get to class regularly!!  Long: Continue to work on increasing physcial activity.   Short: Get to class regularly.  Long: Continue to work on increasing physcial activity.    [place] Name 08/03/18 1546 08/18/18 1409           Exercise Goal Re-Evaluation   Exercise Goals Review  Increase Physical Activity;Increase Strength and Stamina  Increase Physical Activity;Increase Strength and Stamina      Comments  Raed continues to have poor attendance which inhibits his progress. We will continue to monitor his progress and encourage improved attendance.   Clare is still only attending once evey two weeks.  He has not made any progress as a result of his poor attendance.  We will continue to encourage attendance and monitor his progress.       Expected Outcomes  Short: Attend regularly.  Long: Continue to increase strength and stamina.   Short:  Attend regularly.  Long: Continue to increase strength and stamina.          Nutrition & Weight - Outcomes: Pre Biometrics - 03/17/18 1527      Pre Biometrics   Height  6' 1.5" (1.867 m)    Weight  (!) 313 lb 3.2 oz (142.1 kg)    Waist Circumference  53 inches    Hip Circumference  50 inches    Waist to Hip Ratio  1.06 %    BMI (Calculated)  40.76    Single Leg Stand  0 seconds        Nutrition: Nutrition Therapy & Goals - 04/13/18 1358      Nutrition Therapy   Diet  DM    Drug/Food Interactions  Statins/Certain Fruits    Protein (specify units)  12-13oz    Fiber  35 grams    Whole Grain Foods  3 servings   eats white-wheat or rye bread   Saturated Fats  16 max. grams    Fruits and Vegetables  6 servings/day   8 ideal; eats more fruits than vegetables   Sodium  2000 grams   does not need to pt reports doctor to tell pt not to  worry about monitoring his sodium intake currently     Personal Nutrition Goals   Nutrition Goal  Keep intake of non-nutritious liquid calories IE beer to a minimum. Try to reduce daily consumption from 3-4/day to 1-2/day as an initial goal    Personal Goal #2  Choose evening snacks that are low sugar or zero sugar as opposed to pies and cakes. For example, sugar free pudding or fruit with yogurt    Comments  He has made several changes to his eating practices which has resulted in wt loss of 64# x2 years. BG control has improved, most recent 3 month average 130; HgbA1c 6.7. He has started to eat "lighter" meals and no longer drinks sodas. He has reduced frequency of eating food out of the home from several times per week to zero in the past several months. Drinks water and unsweetened iced tea for beverages, but also typically drinks 3-4 beers per day. Breakfast: oatmeal with fruit and honey, Lunch: apple and cheese, Dinner: soup & sandwich, occasional frozen pizza, beef stew. Typically has a sweet evening snack such as pie but has also tried some sugar free dessert options. Does not eat many vegetables at this time and does not feel that he eats a lot of meat      Intervention Plan   Intervention  Prescribe, educate and counsel regarding individualized specific dietary modifications aiming towards targeted core components such as weight, hypertension, lipid management, diabetes, heart failure and other comorbidities.    Expected Outcomes  Short Term Goal: Understand basic principles of dietary content, such as calories, fat, sodium, cholesterol and nutrients.;Short Term Goal: A plan has been developed with personal nutrition goals set during dietitian appointment.;Long Term Goal: Adherence to prescribed nutrition plan.       Nutrition Discharge:   Education Questionnaire Score: Knowledge Questionnaire Score - 03/17/18 1452      Knowledge Questionnaire Score   Pre Score  16/18   REVIEWED WITH  patient      Goals reviewed with patient; copy given to patient.

## 2018-08-18 NOTE — Telephone Encounter (Signed)
Spoke to patient about his lack of attendance and that he has not progressed since the start of the program on 03/17/2018. Informed patient about the [employer] program and will send him information. Patient verbalizes understanding and discharged.

## 2018-11-30 ENCOUNTER — Telehealth: Payer: Self-pay | Admitting: Internal Medicine

## 2018-11-30 NOTE — Progress Notes (Deleted)
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 --Continue current measures, continue nocturnal ventilation.   Dyspnea on exertion, debility/deconditioning. Chronic bronchitis/COPD.  - Patient has had minimal to no relief after removing the endobronchial polyp. - Discussed that his chronic and progressive dyspnea on exertion is likely chronic, he is hoping for a combination of inhalers that can make him feel back to normal again.  I explained that this is unlikely, will start him on nebulized Pulmicort to see if this addition will help. - Discussed that some of his deconditioning is secondary to obesity, lack of physical activity.  Will refer to pulmonary rehab to try to improve his deconditioning.  This may be challenging given his comorbidities and peripheral neuropathy which makes walking difficult. 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  This could cause dynamic airway collapse with dyspnea on exertion.  Upon discussion of his multiple issues, he is very distant encouraged, he is hoping to live until the age of 58 or more.  I discussed that he may benefit from a second opinion from a pul[facility], to see if there is any other interventions or treatments as he could be offered.  Left endobronchial lesion.  --Status post bronchoscopy 07/28/2017, left endobronchial lesion, biopsy negative for malignancy. - [facility], biopsy result consistent with Endobronchial leiomyoma.     Date: 11/30/2018  MRN# [id number] [name] 29-Apr-1961   [name] is a 58 y.o. old male seen in follow up for chief complaint of  No chief complaint on file.    HPI:  The patient is a 58 year old male with chronic hypoxic  respiratory failure, with tracheal stenosis. He has a permanent tracheostomy and is on a home ventilator at nighttime.  He had a previous CT chest in May 2018 which was suggestive of an endobronchial lesion.  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  He was subsequently referred to [facility] for [facility] management and possible resection. Upon eval there he was found to have significant bradycardia and ended up being admitted and getting a pacemaker. He went pre-op eval a number of times but procedure was delayed due to comorbidities. He was finally able to undergo the procedure on 01/29/2018, which showed a left mainstem endobronchial Leo myoma.  Desat walk 02/27/18; baseline sat at rest on RA was 93% and HR 56, pt walked 360 feet at slow pace with significant anxiety , however did not appear in distress. Sat dropped to 87%, then increased to 90% at rest.  Despite the resection he continues to have dyspnea on exertion, he is using albuterol nebs 2-3 times per day, he continues to have dyspnea, he continues to cough up brown phlegm.   A. Bronchus, left mainstem, endobronchial biopsy:   Endobronchial leiomyoma. See comment.  Comment: The biopsy is composed of a bland spindle cell proliferation with smooth muscle differentiation involving the bronchiolar wall accompanied by squamous metaplasia of the overlying airway epithelium. The morphologic and immunophenotypic profile (see below) support the above diagnosis.    Medication:    Current Outpatient Medications:    acetaminophen (TYLENOL) 325 MG tablet, Take 1 tablet (325 mg total) by mouth every 6 (six) hours as needed for  mild pain (or Fever >/= 101)., Disp: , Rfl:    ALPRAZolam (XANAX) 0.5 MG tablet, Take 1 tablet (0.5 mg total) by mouth every 8 (eight) hours as needed for anxiety., Disp: 20 tablet, Rfl: 0   AMBULATORY NON FORMULARY  MEDICATION, Medication Name: Trachea collar for use with nebulizer DX: J96.10, Disp: 1 each, Rfl: 0   AMBULATORY NON FORMULARY MEDICATION, Medication Name: Nebulizer DX: J96.10, Disp: 1 each, Rfl: 0   amiodarone (PACERONE) 200 MG tablet, Take 200 mg by mouth 2 (two) times daily., Disp: , Rfl: 8   atorvastatin (LIPITOR) 40 MG tablet, Take 1 tablet (40 mg total) by mouth daily at 6 PM., Disp: 30 tablet, Rfl: 1   atorvastatin (LIPITOR) 80 MG tablet, Take 80 mg by mouth at bedtime., Disp: , Rfl:    budesonide (PULMICORT) 0.5 MG/2ML nebulizer solution, Take 2 mLs (0.5 mg total) by nebulization 2 (two) times daily., Disp: 75 mL, Rfl: 12   busPIRone (BUSPAR) 10 MG tablet, Take 10 mg by mouth 2 (two) times daily as needed. , Disp: , Rfl:    collagenase (SANTYL) ointment, Apply topically daily., Disp: 15 g, Rfl: 0   enalapril (VASOTEC) 2.5 MG tablet, Take 1 tablet (2.5 mg total) by mouth daily., Disp: 30 tablet, Rfl: 0   feeding supplement, GLUCERNA SHAKE, (GLUCERNA SHAKE) LIQD, Take 237 mLs by mouth 2 (two) times daily between meals., Disp: 60 Can, Rfl: 0   folic acid (FOLVITE) 1 MG tablet, Take 1 tablet (1 mg total) by mouth daily., Disp: 30 tablet, Rfl: 0   furosemide (LASIX) 40 MG tablet, Take 1 tablet (40 mg total) by mouth daily., Disp: 30 tablet, Rfl: 0   gabapentin (NEURONTIN) 800 MG tablet, Take 1 tablet (800 mg total) by mouth 3 (three) times daily. 1 tablet twice daily (Patient taking differently: Take 1,600 mg by mouth 2 (two) times daily. ), Disp: 60 tablet, Rfl: 0   insulin detemir (LEVEMIR) 100 unit/ml SOLN, Inject 20 Units into the skin 2 (two) times daily. , Disp: , Rfl:    insulin lispro (HUMALOG) 100 UNIT/ML injection, Inject 2-10 Units into the skin 3 (three) times daily with meals. , Disp: , Rfl:    ipratropium-albuterol (DUONEB) 0.5-2.5 (3) MG/3ML SOLN, Take 3 mLs by nebulization 3 (three) times daily., Disp: 360 mL, Rfl: 1   lamoTRIgine (LAMICTAL) 150 MG tablet, Take 150  mg by mouth 2 (two) times daily. , Disp: , Rfl:    metFORMIN (GLUCOPHAGE-XR) 500 MG 24 hr tablet, Take 1,000 mg by mouth 2 (two) times daily. , Disp: , Rfl:    metoprolol tartrate 75 MG TABS, Take 75 mg by mouth 2 (two) times daily. 100 mg twice daily, Disp: 60 tablet, Rfl: 0   Multiple Vitamin (MULTIVITAMIN WITH MINERALS) TABS tablet, Take 1 tablet by mouth daily., Disp: , Rfl:    omeprazole (PRILOSEC) 20 MG capsule, Take 20 mg by mouth daily. Once daily, Disp: , Rfl:    oxyCODONE-acetaminophen (PERCOCET) 10-325 MG tablet, Take 1 tablet by mouth 4 (four) times daily as needed for pain. , Disp: , Rfl:    polyethylene glycol (MIRALAX / GLYCOLAX) packet, Take 17 g by mouth daily as needed for mild constipation., Disp: 14 each, Rfl: 0   rivaroxaban (XARELTO) 20 MG TABS tablet, Take 20 mg by mouth daily., Disp: , Rfl:    senna-docusate (SENOKOT-S) 8.6-50 MG tablet, Take 2 tablets by mouth at bedtime as needed for mild constipation., Disp: , Rfl:    thiamine  100 MG tablet, Take 1 tablet (100 mg total) by mouth daily., Disp: 30 tablet, Rfl: 0   Allergies:  Cephalexin; Hctz  [hydrochlorothiazide]; Hydralazine; Penicillins; Reserpine; and Zaroxolyn [metolazone]  Review of Systems:  Constitutional: Feels well. Cardiovascular: No chest pain.  Pulmonary: Denies hemoptysis  The remainder of systems were reviewed and were found to be negative other than what is documented in the HPI.   Physical Examination:   VS: There were no vitals taken for this visit.  General Appearance: No distress  Neuro:without focal findings, mental status, speech normal, alert and oriented HEENT: PERRLA, EOM intact, trach in place.  Pulmonary: No wheezing, No rales  CardiovascularNormal S1,S2.  No m/r/g.  Abdomen: Benign, Soft, non-tender, No masses Renal:  No costovertebral tenderness  GU:  No performed at this time. Endoc: No evident thyromegaly, no signs of acromegaly or Cushing features Skin:   warm, no  rashes, no ecchymosis  Extremities: normal, no cyanosis, clubbing.      LABORATORY PANEL:   CBC No results for input(s): WBC, HGB, HCT, PLT in the last 168 hours. ------------------------------------------------------------------------------------------------------------------  Chemistries  No results for input(s): NA, K, CL, CO2, GLUCOSE, BUN, CREATININE, CALCIUM, MG, AST, ALT, ALKPHOS, BILITOT in the last 168 hours.  Invalid input(s): GFRCGP ------------------------------------------------------------------------------------------------------------------  Cardiac Enzymes No results for input(s): TROPONINI in the last 168 hours. ------------------------------------------------------------  RADIOLOGY:   No results found for this or any previous visit. Results for orders placed during the hospital encounter of 11/20/17  DG Chest 2 View   Narrative CLINICAL DATA:  Increased shortness of breath.  EXAM: CHEST - 2 VIEW  COMPARISON:  October 07, 2017  FINDINGS: The tracheostomy tube is stable. Stable cardiomegaly. The hila and mediastinum are normal. No pneumothorax. No nodules or masses. No focal infiltrates. No overt edema. Healed right rib fractures identified.  IMPRESSION: No active cardiopulmonary disease.   Electronically Signed   By: Gerome Samavid  Williams III M.D   On: 11/20/2017 16:56    ------------------------------------------------------------------------------------------------------------------  Thank  you for allowing [facility] to assist in the care of your patient. Our recommendations are noted above.  Please contact us if we can be of further service.  Wells Guileseep Unita Detamore, M.D., F.C.C.P.  Board Certified in Internal Medicine, Pulmonary Medicine, Critical Care Medicine, and Sleep Medicine.  [facility] Office Number: [phone]   11/30/2018

## 2018-11-30 NOTE — Telephone Encounter (Signed)
Called pt to confirm 12/01/2018 appt. And to let pt know that we have not started Spirometry testing back, would he like to proceed with appt.?

## 2018-12-01 ENCOUNTER — Ambulatory Visit: Payer: Medicare HMO | Admitting: Internal Medicine

## 2018-12-08 IMAGING — US US ABDOMEN LIMITED
1 series · 14 of 25 positions shown · non-contrast
Comparison: None.

CLINICAL DATA: Right upper quadrant pain

EXAM:
ULTRASOUND ABDOMEN LIMITED RIGHT UPPER QUADRANT

[Series 1: us abdomen limited · 0.26mm/px · 14 of 38 slices shown]
[im 1/38]
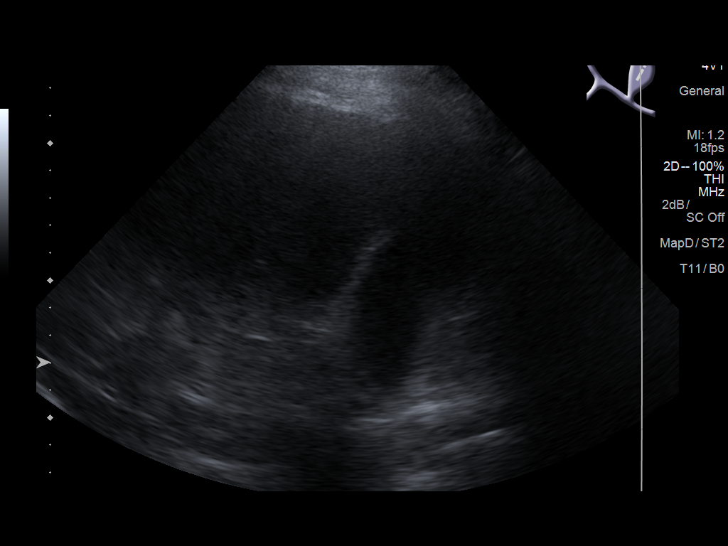
[im 4/38]
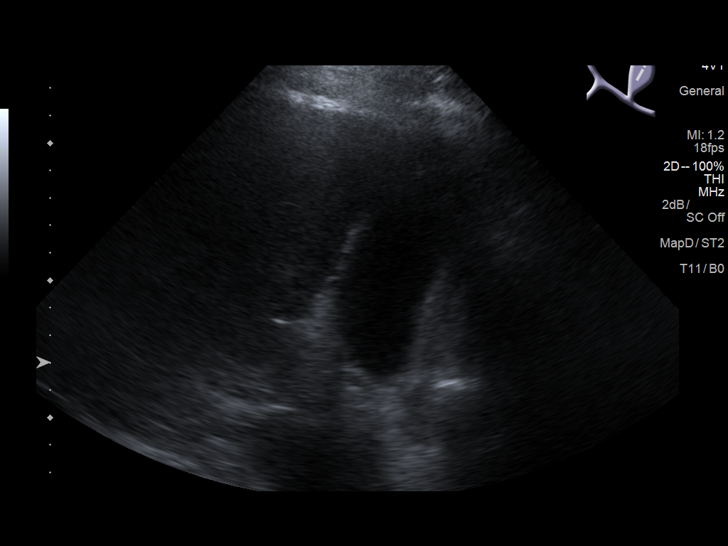
[im 7/38]
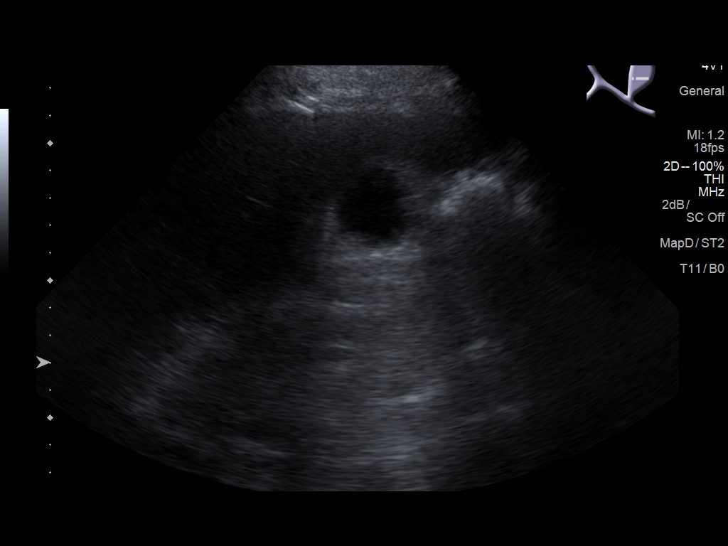
[im 10/38]
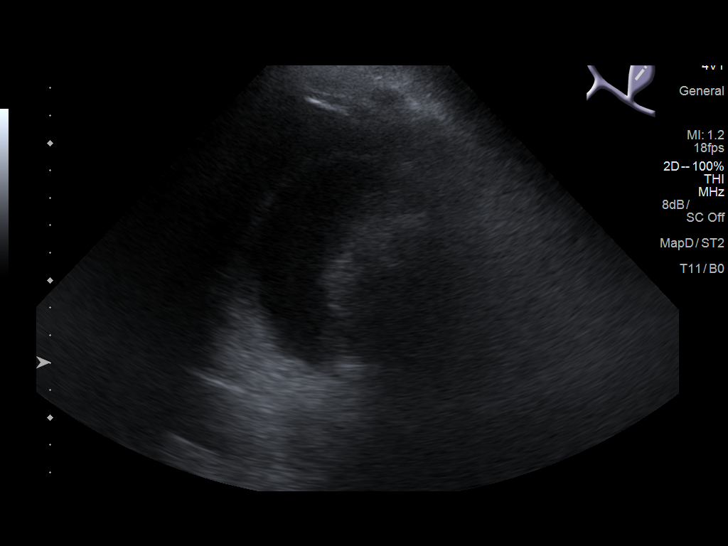
[im 13/38]
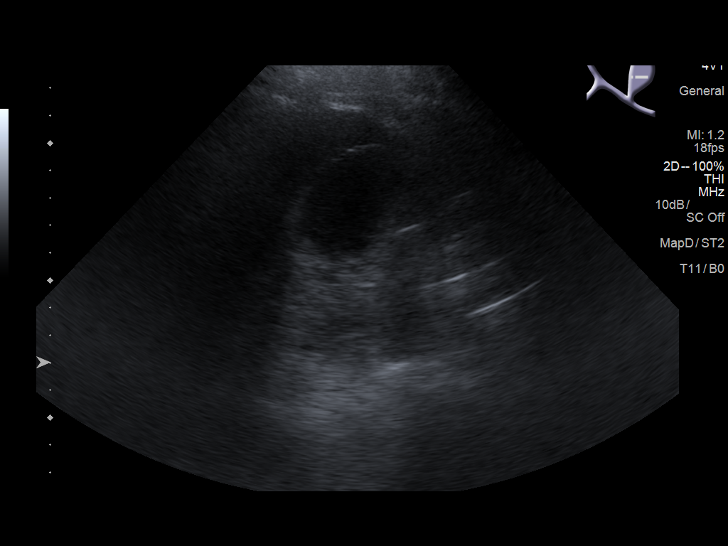
[im 14/38]
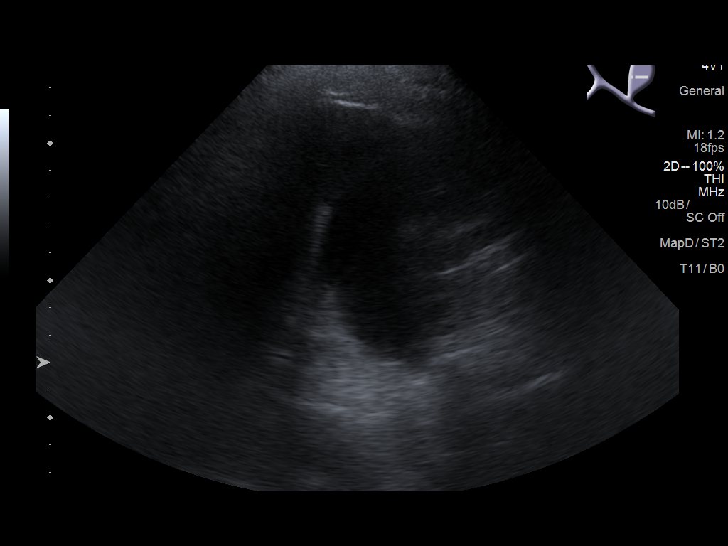
[im 17/38]
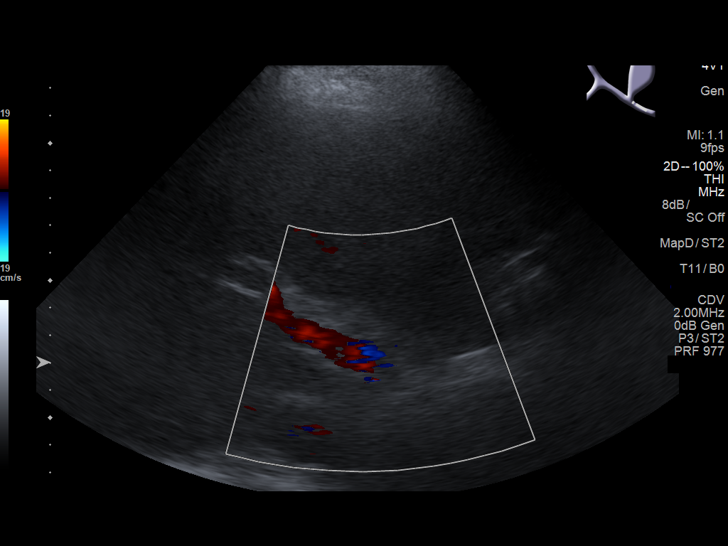
[im 21/38]
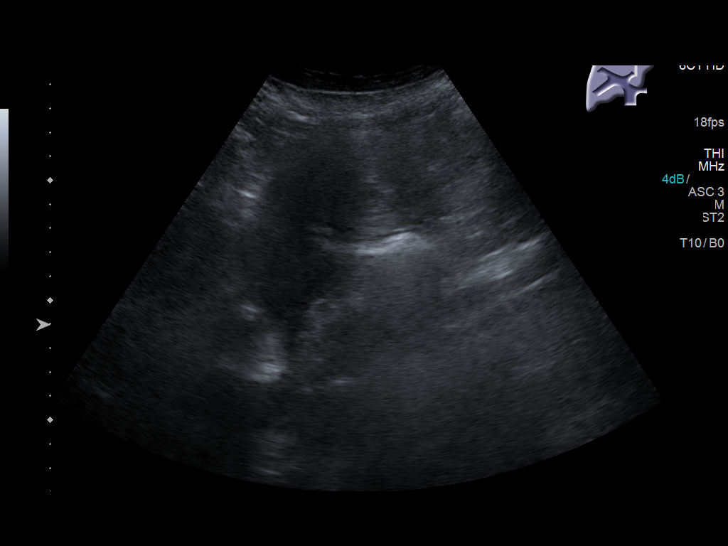
[im 24/38]
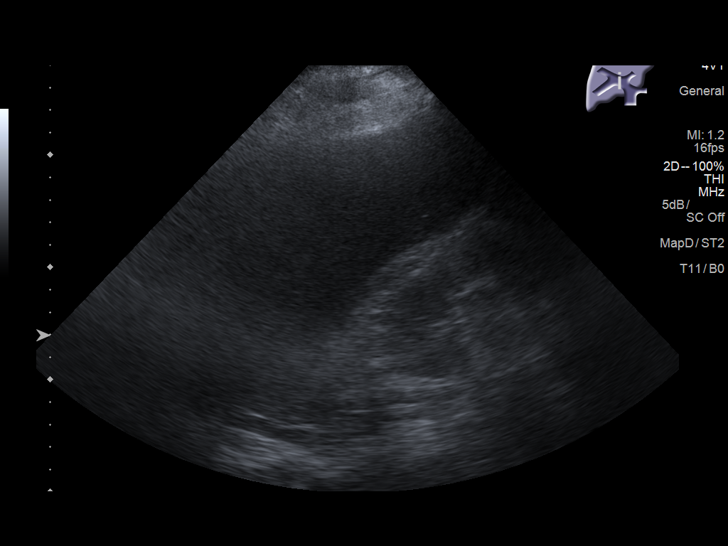
[im 25/38]
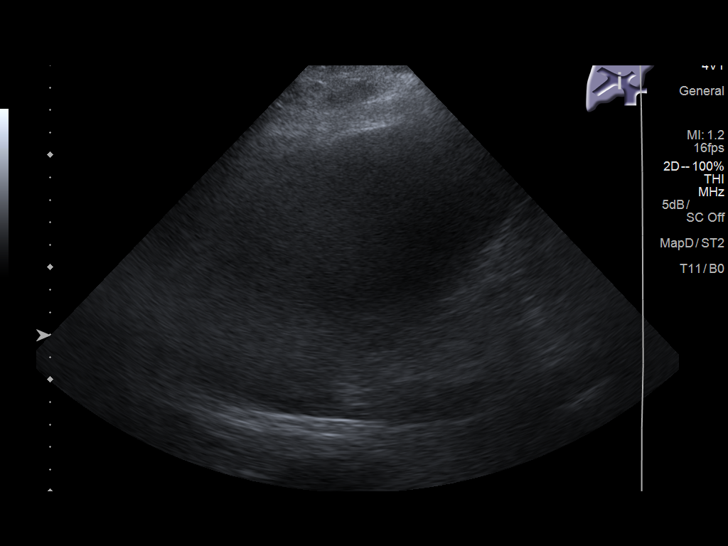
[im 28/38]
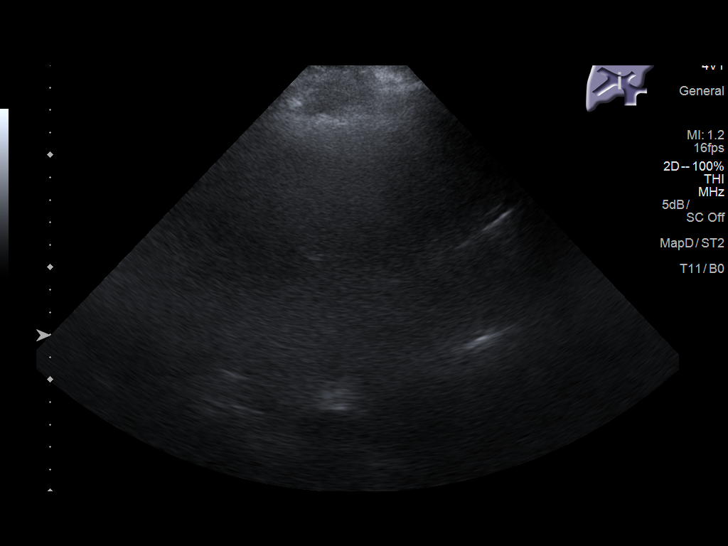
[im 31/38]
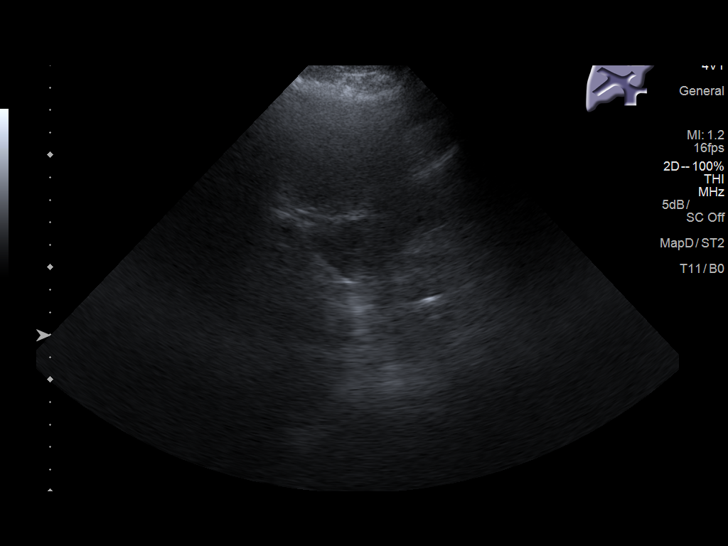
[im 34/38]
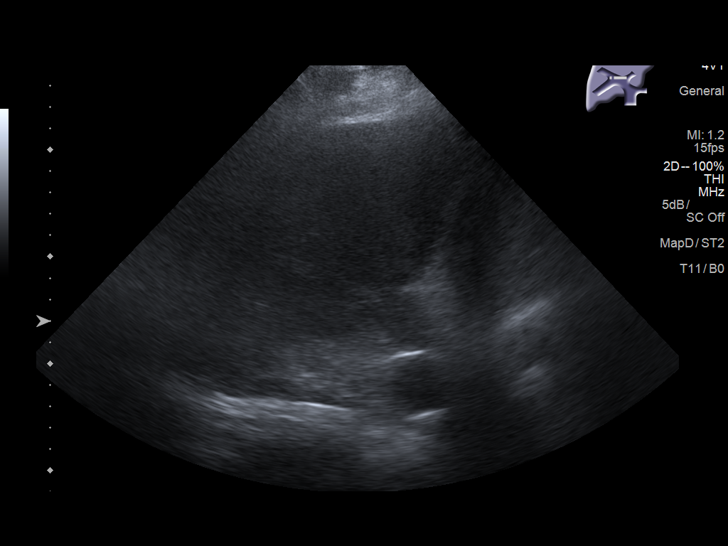
[im 38/38]
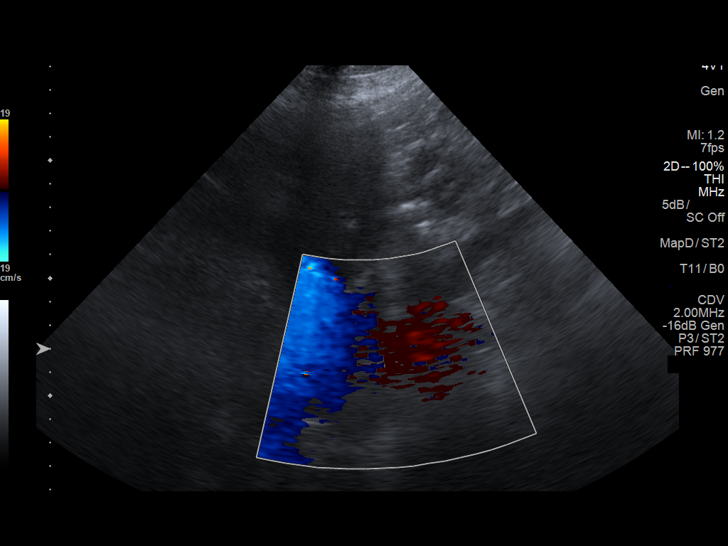

[14 of 25 positions shown; findings below may reference images not displayed]

FINDINGS: Gallbladder:

No gallstones or wall thickening visualized. No sonographic Murphy
sign noted by sonographer.

Common bile duct:

Diameter: 3.9 mm

Liver:

No focal lesion identified. Increased hepatic parenchymal
echogenicity. Portal vein is patent on color Doppler imaging with
normal direction of blood flow towards the liver.
IMPRESSION: 1. No cholelithiasis or sonographic evidence of acute cholecystitis.
2. Increased hepatic echogenicity as can be seen with hepatic
steatosis.

## 2019-01-01 ENCOUNTER — Inpatient Hospital Stay
Admission: EM | Admit: 2019-01-01 | Discharge: 2019-01-09 | DRG: 872 | Disposition: A | Discharge status: Home / Self Care | Payer: Medicare HMO | Attending: Internal Medicine | Admitting: Internal Medicine

## 2019-01-01 ENCOUNTER — Emergency Department: Payer: Medicare HMO

## 2019-01-01 ENCOUNTER — Encounter: Payer: Self-pay | Admitting: Emergency Medicine

## 2019-01-01 ENCOUNTER — Other Ambulatory Visit: Payer: Self-pay

## 2019-01-01 DIAGNOSIS — K861 Other chronic pancreatitis: Secondary | ICD-10-CM | POA: Diagnosis present

## 2019-01-01 DIAGNOSIS — K219 Gastro-esophageal reflux disease without esophagitis: Secondary | ICD-10-CM | POA: Diagnosis present

## 2019-01-01 DIAGNOSIS — L03115 Cellulitis of right lower limb: Secondary | ICD-10-CM | POA: Diagnosis present

## 2019-01-01 DIAGNOSIS — A419 Sepsis, unspecified organism: Secondary | ICD-10-CM | POA: Diagnosis present

## 2019-01-01 DIAGNOSIS — I2510 Atherosclerotic heart disease of native coronary artery without angina pectoris: Secondary | ICD-10-CM | POA: Diagnosis present

## 2019-01-01 DIAGNOSIS — M25422 Effusion, left elbow: Secondary | ICD-10-CM | POA: Diagnosis present

## 2019-01-01 DIAGNOSIS — L03114 Cellulitis of left upper limb: Secondary | ICD-10-CM | POA: Diagnosis present

## 2019-01-01 DIAGNOSIS — Z7901 Long term (current) use of anticoagulants: Secondary | ICD-10-CM | POA: Diagnosis not present

## 2019-01-01 DIAGNOSIS — R6520 Severe sepsis without septic shock: Secondary | ICD-10-CM | POA: Diagnosis present

## 2019-01-01 DIAGNOSIS — L0390 Cellulitis, unspecified: Secondary | ICD-10-CM | POA: Diagnosis present

## 2019-01-01 DIAGNOSIS — E785 Hyperlipidemia, unspecified: Secondary | ICD-10-CM | POA: Diagnosis present

## 2019-01-01 DIAGNOSIS — Z8249 Family history of ischemic heart disease and other diseases of the circulatory system: Secondary | ICD-10-CM | POA: Diagnosis not present

## 2019-01-01 DIAGNOSIS — Z930 Tracheostomy status: Secondary | ICD-10-CM

## 2019-01-01 DIAGNOSIS — Z794 Long term (current) use of insulin: Secondary | ICD-10-CM

## 2019-01-01 DIAGNOSIS — I5032 Chronic diastolic (congestive) heart failure: Secondary | ICD-10-CM | POA: Diagnosis present

## 2019-01-01 DIAGNOSIS — E1169 Type 2 diabetes mellitus with other specified complication: Secondary | ICD-10-CM | POA: Diagnosis present

## 2019-01-01 DIAGNOSIS — J9610 Chronic respiratory failure, unspecified whether with hypoxia or hypercapnia: Secondary | ICD-10-CM | POA: Diagnosis present

## 2019-01-01 DIAGNOSIS — I110 Hypertensive heart disease with heart failure: Secondary | ICD-10-CM | POA: Diagnosis present

## 2019-01-01 DIAGNOSIS — K760 Fatty (change of) liver, not elsewhere classified: Secondary | ICD-10-CM | POA: Diagnosis present

## 2019-01-01 DIAGNOSIS — E1142 Type 2 diabetes mellitus with diabetic polyneuropathy: Secondary | ICD-10-CM | POA: Diagnosis present

## 2019-01-01 DIAGNOSIS — Z7951 Long term (current) use of inhaled steroids: Secondary | ICD-10-CM

## 2019-01-01 DIAGNOSIS — Z880 Allergy status to penicillin: Secondary | ICD-10-CM | POA: Diagnosis not present

## 2019-01-01 DIAGNOSIS — Z1159 Encounter for screening for other viral diseases: Secondary | ICD-10-CM

## 2019-01-01 DIAGNOSIS — I252 Old myocardial infarction: Secondary | ICD-10-CM | POA: Diagnosis not present

## 2019-01-01 DIAGNOSIS — I480 Paroxysmal atrial fibrillation: Secondary | ICD-10-CM | POA: Diagnosis present

## 2019-01-01 DIAGNOSIS — Z79891 Long term (current) use of opiate analgesic: Secondary | ICD-10-CM

## 2019-01-01 DIAGNOSIS — Z6841 Body Mass Index (BMI) 40.0 and over, adult: Secondary | ICD-10-CM | POA: Diagnosis not present

## 2019-01-01 DIAGNOSIS — Z79899 Other long term (current) drug therapy: Secondary | ICD-10-CM

## 2019-01-01 DIAGNOSIS — L03116 Cellulitis of left lower limb: Secondary | ICD-10-CM | POA: Diagnosis not present

## 2019-01-01 DIAGNOSIS — Z86711 Personal history of pulmonary embolism: Secondary | ICD-10-CM

## 2019-01-01 DIAGNOSIS — E662 Morbid (severe) obesity with alveolar hypoventilation: Secondary | ICD-10-CM | POA: Diagnosis present

## 2019-01-01 LAB — URINALYSIS, COMPLETE (UACMP) WITH MICROSCOPIC
Bacteria, UA: NONE SEEN
Bilirubin Urine: NEGATIVE
Glucose, UA: NEGATIVE mg/dL
Ketones, ur: NEGATIVE mg/dL
Nitrite: NEGATIVE
Protein, ur: 30 mg/dL — AB
Specific Gravity, Urine: 1.012 (ref 1.005–1.030)
WBC, UA: >50 WBC/hpf — ABNORMAL HIGH (ref 0–5)
pH: 5 (ref 5.0–8.0)

## 2019-01-01 LAB — CBC WITH DIFFERENTIAL/PLATELET
Abs Immature Granulocytes: 0.04 10*3/uL (ref 0.00–0.07)
Basophils Absolute: 0.1 10*3/uL (ref 0.0–0.1)
Basophils Relative: 1 %
Eosinophils Absolute: 0.1 10*3/uL (ref 0.0–0.5)
Eosinophils Relative: 1 %
HCT: 35.9 % — ABNORMAL LOW (ref 39.0–52.0)
Hemoglobin: 12 g/dL — ABNORMAL LOW (ref 13.0–17.0)
Immature Granulocytes: 0 %
Lymphocytes Relative: 11 %
Lymphs Abs: 1.1 10*3/uL (ref 0.7–4.0)
MCH: 29.2 pg (ref 26.0–34.0)
MCHC: 33.4 g/dL (ref 30.0–36.0)
MCV: 87.3 fL (ref 80.0–100.0)
Monocytes Absolute: 1.1 10*3/uL — ABNORMAL HIGH (ref 0.1–1.0)
Monocytes Relative: 11 %
Neutro Abs: 7.9 10*3/uL — ABNORMAL HIGH (ref 1.7–7.7)
Neutrophils Relative %: 76 %
Platelets: 234 10*3/uL (ref 150–400)
RBC: 4.11 MIL/uL — ABNORMAL LOW (ref 4.22–5.81)
RDW: 13.9 % (ref 11.5–15.5)
WBC: 10.4 10*3/uL (ref 4.0–10.5)
nRBC: 0 % (ref 0.0–0.2)

## 2019-01-01 LAB — COMPREHENSIVE METABOLIC PANEL
ALT: 28 U/L (ref 0–44)
AST: 33 U/L (ref 15–41)
Albumin: 3.5 g/dL (ref 3.5–5.0)
Alkaline Phosphatase: 79 U/L (ref 38–126)
Anion gap: 13 (ref 5–15)
BUN: 22 mg/dL — ABNORMAL HIGH (ref 6–20)
CO2: 27 mmol/L (ref 22–32)
Calcium: 8.7 mg/dL — ABNORMAL LOW (ref 8.9–10.3)
Chloride: 91 mmol/L — ABNORMAL LOW (ref 98–111)
Creatinine, Ser: 1.13 mg/dL (ref 0.61–1.24)
GFR calc Af Amer: >60 mL/min (ref >60)
GFR calc non Af Amer: >60 mL/min (ref >60)
Glucose, Bld: 214 mg/dL — ABNORMAL HIGH (ref 70–99)
Potassium: 3.8 mmol/L (ref 3.5–5.1)
Sodium: 131 mmol/L — ABNORMAL LOW (ref 135–145)
Total Bilirubin: 0.8 mg/dL (ref 0.3–1.2)
Total Protein: 7.2 g/dL (ref 6.5–8.1)

## 2019-01-01 LAB — PROTIME-INR
INR: 3.4 — ABNORMAL HIGH (ref 0.8–1.2)
Prothrombin Time: 33.7 seconds — ABNORMAL HIGH (ref 11.4–15.2)

## 2019-01-01 LAB — LACTIC ACID, PLASMA
Lactic Acid, Venous: 2.2 mmol/L (ref 0.5–1.9)
Lactic Acid, Venous: 1.8 mmol/L (ref 0.5–1.9)

## 2019-01-01 LAB — MRSA PCR SCREENING: MRSA by PCR: NEGATIVE

## 2019-01-01 LAB — SARS CORONAVIRUS 2 BY RT PCR (HOSPITAL ORDER, PERFORMED IN ~~LOC~~ HOSPITAL LAB): SARS Coronavirus 2: NEGATIVE

## 2019-01-01 MED ORDER — VANCOMYCIN HCL IN DEXTROSE 1-5 GM/200ML-% IV SOLN
1000.0000 mg | Freq: Once | INTRAVENOUS | Status: DC
  Filled 2019-01-01: qty 200

## 2019-01-01 MED ORDER — CIPROFLOXACIN IN D5W 400 MG/200ML IV SOLN
400.0000 mg | Freq: Once | INTRAVENOUS | Status: AC
  Administered 2019-01-01: 400 mg via INTRAVENOUS
  Filled 2019-01-01: qty 200

## 2019-01-01 MED ORDER — POLYETHYLENE GLYCOL 3350 17 G PO PACK: 17.0000 g | PACK | Freq: Every day | ORAL | Status: DC | PRN

## 2019-01-01 MED ORDER — BUDESONIDE 0.5 MG/2ML IN SUSP
0.5000 mg | Freq: Two times a day (BID) | RESPIRATORY_TRACT | Status: DC
  Administered 2019-01-02 – 2019-01-09 (×15): 0.5 mg via RESPIRATORY_TRACT
  Filled 2019-01-01 (×15): qty 2

## 2019-01-01 MED ORDER — IPRATROPIUM-ALBUTEROL 0.5-2.5 (3) MG/3ML IN SOLN
3.0000 mL | Freq: Three times a day (TID) | RESPIRATORY_TRACT | Status: DC
  Administered 2019-01-02 – 2019-01-09 (×22): 3 mL via RESPIRATORY_TRACT
  Filled 2019-01-01 (×23): qty 3

## 2019-01-01 MED ORDER — VITAMIN B-1 100 MG PO TABS
100.0000 mg | ORAL_TABLET | Freq: Every day | ORAL | Status: DC
  Administered 2019-01-02 – 2019-01-09 (×8): 100 mg via ORAL
  Filled 2019-01-01 (×8): qty 1

## 2019-01-01 MED ORDER — INSULIN DETEMIR 100 UNIT/ML ~~LOC~~ SOLN
20.0000 [IU] | Freq: Two times a day (BID) | SUBCUTANEOUS | Status: DC
  Administered 2019-01-01 – 2019-01-05 (×8): 20 [IU] via SUBCUTANEOUS
  Filled 2019-01-01 (×9): qty 0.2

## 2019-01-01 MED ORDER — LAMOTRIGINE 25 MG PO TABS
150.0000 mg | ORAL_TABLET | Freq: Two times a day (BID) | ORAL | Status: DC
  Administered 2019-01-01 – 2019-01-09 (×16): 150 mg via ORAL
  Filled 2019-01-01 (×17): qty 2

## 2019-01-01 MED ORDER — OXYCODONE-ACETAMINOPHEN 10-325 MG PO TABS: 1.0000 | ORAL_TABLET | Freq: Four times a day (QID) | ORAL | Status: DC | PRN

## 2019-01-01 MED ORDER — FOLIC ACID 1 MG PO TABS
1.0000 mg | ORAL_TABLET | Freq: Every day | ORAL | Status: DC
  Administered 2019-01-02 – 2019-01-09 (×8): 1 mg via ORAL
  Filled 2019-01-01 (×8): qty 1

## 2019-01-01 MED ORDER — OXYCODONE-ACETAMINOPHEN 5-325 MG PO TABS
2.0000 | ORAL_TABLET | Freq: Four times a day (QID) | ORAL | Status: DC | PRN
  Administered 2019-01-01 – 2019-01-09 (×18): 2 via ORAL
  Filled 2019-01-01 (×18): qty 2

## 2019-01-01 MED ORDER — MORPHINE SULFATE (PF) 2 MG/ML IV SOLN
1.0000 mg | INTRAVENOUS | Status: DC | PRN
  Administered 2019-01-02 – 2019-01-04 (×10): 1 mg via INTRAVENOUS
  Filled 2019-01-01 (×12): qty 1

## 2019-01-01 MED ORDER — SENNOSIDES-DOCUSATE SODIUM 8.6-50 MG PO TABS: 2.0000 | ORAL_TABLET | Freq: Every evening | ORAL | Status: DC | PRN

## 2019-01-01 MED ORDER — CHLORHEXIDINE GLUCONATE CLOTH 2 % EX PADS
6.0000 | MEDICATED_PAD | Freq: Every day | CUTANEOUS | Status: DC
  Administered 2019-01-01 – 2019-01-02 (×2): 6 via TOPICAL

## 2019-01-01 MED ORDER — FUROSEMIDE 40 MG PO TABS
40.0000 mg | ORAL_TABLET | Freq: Two times a day (BID) | ORAL | Status: DC
  Administered 2019-01-02 – 2019-01-06 (×9): 40 mg via ORAL
  Filled 2019-01-01 (×2): qty 2
  Filled 2019-01-01 (×8): qty 1
  Filled 2019-01-01 (×3): qty 2
  Filled 2019-01-01 (×2): qty 1
  Filled 2019-01-01 (×2): qty 2

## 2019-01-01 MED ORDER — PANTOPRAZOLE SODIUM 40 MG PO TBEC
40.0000 mg | DELAYED_RELEASE_TABLET | Freq: Every day | ORAL | Status: DC
  Administered 2019-01-02 – 2019-01-09 (×8): 40 mg via ORAL
  Filled 2019-01-01 (×8): qty 1

## 2019-01-01 MED ORDER — GLUCERNA SHAKE PO LIQD
237.0000 mL | Freq: Two times a day (BID) | ORAL | Status: DC
  Administered 2019-01-04 – 2019-01-09 (×3): 237 mL via ORAL

## 2019-01-01 MED ORDER — ADULT MULTIVITAMIN W/MINERALS CH
1.0000 | ORAL_TABLET | Freq: Every day | ORAL | Status: DC
  Administered 2019-01-02 – 2019-01-09 (×8): 1 via ORAL
  Filled 2019-01-01 (×8): qty 1

## 2019-01-01 MED ORDER — AMIODARONE HCL 100 MG PO TABS
100.0000 mg | ORAL_TABLET | Freq: Two times a day (BID) | ORAL | Status: DC
  Administered 2019-01-01 – 2019-01-09 (×16): 100 mg via ORAL
  Filled 2019-01-01 (×17): qty 1

## 2019-01-01 MED ORDER — CIPROFLOXACIN IN D5W 400 MG/200ML IV SOLN
400.0000 mg | Freq: Two times a day (BID) | INTRAVENOUS | Status: DC
  Administered 2019-01-02 – 2019-01-07 (×11): 400 mg via INTRAVENOUS
  Filled 2019-01-01 (×12): qty 200

## 2019-01-01 MED ORDER — BUSPIRONE HCL 5 MG PO TABS
10.0000 mg | ORAL_TABLET | Freq: Two times a day (BID) | ORAL | Status: DC | PRN
  Administered 2019-01-02 – 2019-01-08 (×3): 10 mg via ORAL
  Filled 2019-01-01 (×4): qty 2

## 2019-01-01 MED ORDER — SODIUM CHLORIDE 0.9 % IV SOLN
2.0000 g | Freq: Once | INTRAVENOUS | Status: DC
  Filled 2019-01-01: qty 2

## 2019-01-01 MED ORDER — INSULIN ASPART 100 UNIT/ML ~~LOC~~ SOLN
0.0000 [IU] | Freq: Three times a day (TID) | SUBCUTANEOUS | Status: DC
  Administered 2019-01-02: 3 [IU] via SUBCUTANEOUS
  Administered 2019-01-02 – 2019-01-04 (×3): 2 [IU] via SUBCUTANEOUS
  Administered 2019-01-04: 1 [IU] via SUBCUTANEOUS
  Administered 2019-01-05: 5 [IU] via SUBCUTANEOUS
  Administered 2019-01-05: 7 [IU] via SUBCUTANEOUS
  Administered 2019-01-05 – 2019-01-06 (×2): 5 [IU] via SUBCUTANEOUS
  Administered 2019-01-06: 13:00:00 9 [IU] via SUBCUTANEOUS
  Administered 2019-01-06: 6 [IU] via SUBCUTANEOUS
  Administered 2019-01-07: 9 [IU] via SUBCUTANEOUS
  Administered 2019-01-07: 7 [IU] via SUBCUTANEOUS
  Administered 2019-01-07: 5 [IU] via SUBCUTANEOUS
  Administered 2019-01-08: 9 [IU] via SUBCUTANEOUS
  Administered 2019-01-08: 5 [IU] via SUBCUTANEOUS
  Administered 2019-01-08: 13:00:00 7 [IU] via SUBCUTANEOUS
  Administered 2019-01-09: 5 [IU] via SUBCUTANEOUS
  Administered 2019-01-09: 9 [IU] via SUBCUTANEOUS
  Filled 2019-01-01 (×18): qty 1

## 2019-01-01 MED ORDER — VANCOMYCIN HCL 10 G IV SOLR
1250.0000 mg | Freq: Two times a day (BID) | INTRAVENOUS | Status: DC
  Filled 2019-01-01 (×2): qty 1250

## 2019-01-01 MED ORDER — METRONIDAZOLE IN NACL 5-0.79 MG/ML-% IV SOLN
500.0000 mg | Freq: Once | INTRAVENOUS | Status: AC
  Administered 2019-01-01: 17:00:00 500 mg via INTRAVENOUS
  Filled 2019-01-01: qty 100

## 2019-01-01 MED ORDER — ENALAPRIL MALEATE 2.5 MG PO TABS
2.5000 mg | ORAL_TABLET | Freq: Every day | ORAL | Status: DC
  Administered 2019-01-03 – 2019-01-09 (×7): 2.5 mg via ORAL
  Filled 2019-01-01 (×8): qty 1

## 2019-01-01 MED ORDER — ALPRAZOLAM 0.5 MG PO TABS
0.5000 mg | ORAL_TABLET | Freq: Three times a day (TID) | ORAL | Status: DC | PRN
  Administered 2019-01-07 – 2019-01-09 (×2): 0.5 mg via ORAL
  Filled 2019-01-01 (×2): qty 1

## 2019-01-01 MED ORDER — VANCOMYCIN HCL 10 G IV SOLR
1500.0000 mg | Freq: Once | INTRAVENOUS | Status: AC
  Administered 2019-01-01: 1500 mg via INTRAVENOUS
  Filled 2019-01-01: qty 1500

## 2019-01-01 MED ORDER — ATORVASTATIN CALCIUM 20 MG PO TABS
80.0000 mg | ORAL_TABLET | Freq: Every day | ORAL | Status: DC
  Administered 2019-01-01 – 2019-01-08 (×8): 80 mg via ORAL
  Filled 2019-01-01 (×8): qty 4

## 2019-01-01 MED ORDER — GABAPENTIN 400 MG PO CAPS
1600.0000 mg | ORAL_CAPSULE | Freq: Two times a day (BID) | ORAL | Status: DC
  Administered 2019-01-01 – 2019-01-09 (×16): 1600 mg via ORAL
  Filled 2019-01-01 (×17): qty 4

## 2019-01-01 MED ORDER — METOPROLOL TARTRATE 50 MG PO TABS
100.0000 mg | ORAL_TABLET | Freq: Two times a day (BID) | ORAL | Status: DC
  Filled 2019-01-01 (×2): qty 2

## 2019-01-01 MED ORDER — METFORMIN HCL ER 500 MG PO TB24
1000.0000 mg | ORAL_TABLET | Freq: Two times a day (BID) | ORAL | Status: DC
  Administered 2019-01-02 – 2019-01-08 (×14): 1000 mg via ORAL
  Administered 2019-01-09: 500 mg via ORAL
  Filled 2019-01-01 (×16): qty 2

## 2019-01-01 MED ORDER — COLLAGENASE 250 UNIT/GM EX OINT
TOPICAL_OINTMENT | Freq: Every day | CUTANEOUS | Status: DC
  Administered 2019-01-02 – 2019-01-03 (×2): via TOPICAL
  Administered 2019-01-05: 1 via TOPICAL
  Administered 2019-01-06 – 2019-01-09 (×4): via TOPICAL
  Filled 2019-01-01: qty 30

## 2019-01-01 MED ORDER — INSULIN ASPART 100 UNIT/ML ~~LOC~~ SOLN
0.0000 [IU] | Freq: Every day | SUBCUTANEOUS | Status: DC
  Administered 2019-01-04 – 2019-01-05 (×2): 2 [IU] via SUBCUTANEOUS
  Administered 2019-01-06: 22:00:00 3 [IU] via SUBCUTANEOUS
  Administered 2019-01-07: 4 [IU] via SUBCUTANEOUS
  Administered 2019-01-08: 5 [IU] via SUBCUTANEOUS
  Filled 2019-01-01 (×5): qty 1

## 2019-01-01 MED ORDER — RIVAROXABAN 20 MG PO TABS
20.0000 mg | ORAL_TABLET | Freq: Every day | ORAL | Status: DC
  Administered 2019-01-02 – 2019-01-09 (×8): 20 mg via ORAL
  Filled 2019-01-01 (×8): qty 1

## 2019-01-01 NOTE — ED Notes (Signed)
Lab states they are running lactic now.

## 2019-01-01 NOTE — ED Notes (Signed)
This RN at bedside to place IVs and start meds. Pt using hands against trach to talk with admitting provider. Will complete tasks once pt's arms available.

## 2019-01-01 NOTE — Progress Notes (Signed)
Pharmacy Antibiotic Note  [name] is a 58 y.o. male admitted on 01/01/2019 with left elbow and RLE cellulitis.  Pharmacy has been consulted for cipro and vancomycin dosing.  Pt received cipro 400 mg IV x1 at 1726 in the ED Pt ordered vancomycin 1500 mg + 1000 mg for total 2500 mg loading dose - doses in progress  Plan: Cipro 400 mg IV q12h   Vancomycin 1250 mg IV Q 12 hrs. Goal AUC 400-550. Expected AUC: 518 SCr used: 1.13 Expected Cmin: 15.2  Follow up SCr in AM to monitor renal function  Height: 6' (182.9 cm) Weight: 300 lb (136.1 kg) IBW/kg (Calculated) : 77.6  Temp (24hrs), Avg:99.3 F (37.4 C), Min:99.3 F (37.4 C), Max:99.3 F (37.4 C)  Recent Labs  Lab 01/01/19 1401  WBC 10.4  CREATININE 1.13  LATICACIDVEN 2.2*    Estimated Creatinine Clearance: 104.3 mL/min (by C-G formula based on SCr of 1.13 mg/dL).    Allergies  Allergen Reactions  . Cephalexin     Other reaction(s): Unknown  . Hctz  [Hydrochlorothiazide]     Other reaction(s): Other (See Comments) hyponatremia  . Hydralazine   . Penicillins Hives    Has patient had a PCN reaction causing immediate rash, facial/tongue/throat swelling, SOB or lightheadedness with hypotension: Yes Has patient had a PCN reaction causing severe rash involving mucus membranes or skin necrosis: Yes Has patient had a PCN reaction that required hospitalization: Unknown Has patient had a PCN reaction occurring within the last 10 years: No If all of the above answers are "NO", then may proceed with Cephalosporin use.   Marland Kitchen Reserpine   . Zaroxolyn [Metolazone]     Other reaction(s): Unknown    Antimicrobials this admission: Cipro/vanc 7/10 >>  Dose adjustments this admission:  Microbiology results:  Thank you for allowing pharmacy to be a part of this patient's care.  Rocky Morel 01/01/2019 7:32 PM

## 2019-01-01 NOTE — ED Notes (Signed)
Provider finishing with pt now. Will place lines now.

## 2019-01-01 NOTE — ED Notes (Signed)
Called lab about missing lactic result.

## 2019-01-01 NOTE — Progress Notes (Signed)
PHARMACY -  BRIEF ANTIBIOTIC NOTE   Pharmacy has received consult(s) for vancomycin and cefepime from an ED provider.  The patient's profile has been reviewed for ht/wt/allergies/indication/available labs.    Cefepime changed to cipro by admitting MD  One time order(s) placed for cipro 400 mg IV x1 and vancomycin 1000 mg (ordered by EDP) + 1500 mg for total loading dose of 2500 mg  Further antibiotics/pharmacy consults should be ordered by admitting physician if indicated.                       Thank you, Rocky Morel 01/01/2019  4:48 PM

## 2019-01-01 NOTE — ED Notes (Signed)
Next antibiotic to be started once channel found for pump.

## 2019-01-01 NOTE — Consult Note (Signed)
PULMONARY / CRITICAL CARE MEDICINE  Name: [name] MRN: [id number] DOB: August 05, 1960    LOS: 0  Referring Provider:  Dr. Leslye Peer Reason for Referral:  Cellulitis-left elbow and right leg  HPI: This is a 58 y/o male with with a medical history as indicated below, OSA/OHS with a chronic trach on HS vent support who presented to the ED with complaints of left elbow swelling and pain as well as right lower extremity bruising and redness.  He reported a fever of 101 the night before and had a fever of 100.0 degrees in the ED.  Upper extremity Doppler was negative for DVT and x-ray of his elbow showed extensive soft tissue swelling.  He was admitted with left arm and right lower extremity cellulitis and started on vancomycin and Cipro.  However given that he is on the vent at night, he was admitted to the ICU for overnight vent support.  Besides left elbow pain, he offers no other complaints.  Past Medical History:  Diagnosis Date  . CHF (congestive heart failure) ([place])   . Diabetes mellitus ([place])   . Diabetic peripheral neuropathy ([place])   . Elevated liver enzymes    fatty liver per [facility]  . GERD (gastroesophageal reflux disease)   . History of pulmonary embolus (PE)   . Hyperlipidemia   . Hypertension   . Myocardial infarct ([place])   . OSA (obstructive sleep apnea)   . 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 Renal insufficiency    Past Surgical History:  Procedure Laterality Date  . CARDIAC SURGERY     With stent placement  . FLEXIBLE BRONCHOSCOPY N/A 07/28/2017   Procedure: FLEXIBLE BRONCHOSCOPY;  Surgeon: [name], MD;  Location: [facility] ORS;  Service: Pulmonary;  Laterality: N/A;  . LEFT HEART CATH AND CORONARY ANGIOGRAPHY N/A 09/01/2017   Procedure: LEFT HEART CATH AND CORONARY ANGIOGRAPHY and PCI stent;  Surgeon: Yolonda Kida, MD;  Location: [facility];  Service: Cardiovascular;  Laterality: N/A;  . pace maker   2019  . TRACHEOSTOMY  2007  . VASECTOMY     No current facility-administered medications on file prior to encounter.    Current Outpatient Medications on File Prior to Encounter  Medication Sig  . acetaminophen (TYLENOL) 325 MG tablet Take 1 tablet (325 mg total) by mouth every 6 (six) hours as needed for mild pain (or Fever >/= 101).  Marland Kitchen ALPRAZolam (XANAX) 0.5 MG tablet Take 1 tablet (0.5 mg total) by mouth every 8 (eight) hours as needed for anxiety.  . AMBULATORY NON FORMULARY MEDICATION Medication Name: Trachea collar for use with nebulizer DX: J96.10  . AMBULATORY NON FORMULARY MEDICATION Medication Name: Nebulizer DX: J96.10  . amiodarone (PACERONE) 200 MG tablet Take 200 mg by mouth daily.   Marland Kitchen atorvastatin (LIPITOR) 80 MG tablet Take 80 mg by mouth at bedtime.  . busPIRone (BUSPAR) 10 MG tablet Take 10 mg by mouth 2 (two) times daily as needed (anxiety).   . folic acid (FOLVITE) 1 MG tablet Take 1 tablet (1 mg total) by mouth daily.  . furosemide (LASIX) 80 MG tablet Take 80 mg by mouth 2 (two) times daily.  Marland Kitchen gabapentin (NEURONTIN) 800 MG tablet Take 1 tablet (800 mg total) by mouth 3 (three) times daily. 1 tablet twice daily (Patient taking differently: Take 1,600 mg by mouth 2 (two) times daily. )  . lamoTRIgine (LAMICTAL) 150 MG tablet Take 150 mg by mouth 2 (  two) times daily.   . metFORMIN (GLUCOPHAGE-XR) 500 MG 24 hr tablet Take 1,000 mg by mouth 2 (two) times daily.   . metoprolol tartrate (LOPRESSOR) 100 MG tablet Take 100 mg by mouth 2 (two) times daily.  . Multiple Vitamin (MULTIVITAMIN WITH MINERALS) TABS tablet Take 1 tablet by mouth daily.  Marland Kitchen omeprazole (PRILOSEC) 20 MG capsule Take 20 mg by mouth daily.   Marland Kitchen oxyCODONE-acetaminophen (PERCOCET) 10-325 MG tablet Take 1 tablet by mouth 5 (five) times daily as needed for pain.   . rivaroxaban (XARELTO) 20 MG TABS tablet Take 20 mg by mouth daily.  . budesonide (PULMICORT) 0.5 MG/2ML nebulizer solution Take 2 mLs (0.5 mg  total) by nebulization 2 (two) times daily. (Patient not taking: Reported on 01/01/2019)  . collagenase (SANTYL) ointment Apply topically daily. (Patient not taking: Reported on 01/01/2019)  . enalapril (VASOTEC) 2.5 MG tablet Take 1 tablet (2.5 mg total) by mouth daily. (Patient not taking: Reported on 01/01/2019)  . ipratropium-albuterol (DUONEB) 0.5-2.5 (3) MG/3ML SOLN Take 3 mLs by nebulization 3 (three) times daily. (Patient not taking: Reported on 01/01/2019)  . metoprolol tartrate 75 MG TABS Take 75 mg by mouth 2 (two) times daily. 100 mg twice daily (Patient not taking: Reported on 01/01/2019)  . polyethylene glycol (MIRALAX / GLYCOLAX) packet Take 17 g by mouth daily as needed for mild constipation. (Patient not taking: Reported on 01/01/2019)  . senna-docusate (SENOKOT-S) 8.6-50 MG tablet Take 2 tablets by mouth at bedtime as needed for mild constipation. (Patient not taking: Reported on 01/01/2019)  . thiamine 100 MG tablet Take 1 tablet (100 mg total) by mouth daily. (Patient not taking: Reported on 01/01/2019)    Allergies Allergies  Allergen Reactions  . Cephalexin     Other reaction(s): Unknown  . Hctz  [Hydrochlorothiazide]     Other reaction(s): Other (See Comments) hyponatremia  . Hydralazine   . Penicillins Hives    Has patient had a PCN reaction causing immediate rash, facial/tongue/throat swelling, SOB or lightheadedness with hypotension: Yes Has patient had a PCN reaction causing severe rash involving mucus membranes or skin necrosis: Yes Has patient had a PCN reaction that required hospitalization: Unknown Has patient had a PCN reaction occurring within the last 10 years: No If all of the above answers are "NO", then may proceed with Cephalosporin use.   Marland Kitchen Reserpine   . Zaroxolyn [Metolazone]     Other reaction(s): Unknown    Family History Family History  Problem Relation Age of Onset  . Heart attack Mother   . Coronary artery disease Father   . Heart attack Father    . Kidney cancer Neg Hx   . Kidney disease Neg Hx   . Prostate cancer Neg Hx    Social History  reports that he has never smoked. He has never used smokeless tobacco. He reports current alcohol use of about 6.0 - 7.0 standard drinks of alcohol per week. He reports that he does not use drugs.  Review Of Systems:   Constitutional: Negative for fever and chills.  HENT: Negative for congestion and rhinorrhea.  Eyes: Negative for redness and visual disturbance.  Respiratory: Negative for shortness of breath and wheezing, chronic trach Cardiovascular: Negative for chest pain and palpitations.  Gastrointestinal: Negative  for nausea , vomiting and abdominal pain and  Loose stools Genitourinary: Negative for dysuria and urgency.  Endocrine: Denies polyuria, polyphagia and heat intolerance Musculoskeletal: Positive for left elbow swelling and pain Skin: Extensive bruising and redness in  right lower shin and small skin tear which he attributes to hitting his leg on objects when walking Neurological: Negative for dizziness and headaches   VITAL SIGNS: BP 117/69   Pulse 68   Temp 98.1 F (36.7 C) (Oral)   Resp 16   Ht 6' (1.829 m)   Wt 136.1 kg   SpO2 99%   BMI 40.69 kg/m   HEMODYNAMICS:    VENTILATOR SETTINGS:    INTAKE / OUTPUT: No intake/output data recorded.  PHYSICAL EXAMINATION: General: Obese, in no acute distress HEENT: PERRLA, tracheostomy tube in place Neuro: Alert and oriented x4, no focal deficits Cardiovascular: Apical pulse regular, S1-S2, no murmur regurg or gallop, +2 pulses bilaterally Lungs: Bilateral breath sounds without any wheezes or rhonchi Abdomen: Nondistended, normal bowel sounds in all 4 quadrants Musculoskeletal: Limited range of motion in left elbow joint, swollen, pain with gentle palpation. Skin: Right lower extremity with extensive bruising and swelling and 2 x 2 cm skin tear  LABS:  BMET Recent Labs  Lab 01/01/19 1401  NA 131*  K 3.8   CL 91*  CO2 27  BUN 22*  CREATININE 1.13  GLUCOSE 214*    Electrolytes Recent Labs  Lab 01/01/19 1401  CALCIUM 8.7*    CBC Recent Labs  Lab 01/01/19 1401  WBC 10.4  HGB 12.0*  HCT 35.9*  PLT 234    Coag's Recent Labs  Lab 01/01/19 1401  INR 3.4*    Sepsis Markers Recent Labs  Lab 01/01/19 1401 01/01/19 1703  LATICACIDVEN 2.2* 1.8    ABG No results for input(s): PHART, PCO2ART, PO2ART in the last 168 hours.  Liver Enzymes Recent Labs  Lab 01/01/19 1401  AST 33  ALT 28  ALKPHOS 79  BILITOT 0.8  ALBUMIN 3.5    Cardiac Enzymes No results for input(s): TROPONINI, PROBNP in the last 168 hours.  Glucose No results for input(s): GLUCAP in the last 168 hours.  Imaging Dg Elbow Complete Left  Result Date: 01/01/2019 CLINICAL DATA:  LEFT elbow pain, swelling, bruising, redness and warmth, denies injury EXAM: LEFT ELBOW - COMPLETE 3+ VIEW COMPARISON:  None FINDINGS: Diffuse soft tissue swelling at LEFT elbow region. Osseous mineralization normal. Joint spaces preserved. No acute fracture, dislocation, or bone destruction. No elbow joint effusion. IMPRESSION: Significant soft tissue swelling at the LEFT elbow region. No acute osseous abnormalities. Electronically Signed   By: Lavonia Dana M.D.   On: 01/01/2019 14:40   US Venous Img Upper Uni Left  Result Date: 01/01/2019 CLINICAL DATA:  58 year old male with left arm pain, swelling and redness EXAM: LEFT UPPER EXTREMITY VENOUS DOPPLER ULTRASOUND TECHNIQUE: Gray-scale sonography with graded compression, as well as color Doppler and duplex ultrasound were performed to evaluate the upper extremity deep venous system from the level of the subclavian vein and including the jugular, axillary, basilic, radial, ulnar and upper cephalic vein. Spectral Doppler was utilized to evaluate flow at rest and with distal augmentation maneuvers. COMPARISON:  None. FINDINGS: Contralateral Subclavian Vein: Respiratory phasicity is  normal and symmetric with the symptomatic side. No evidence of thrombus. Normal compressibility. Internal Jugular Vein: No evidence of thrombus. Normal compressibility, respiratory phasicity and response to augmentation. Subclavian Vein: No evidence of thrombus. Normal compressibility, respiratory phasicity and response to augmentation. Axillary Vein: No evidence of thrombus. Normal compressibility, respiratory phasicity and response to augmentation. Cephalic Vein: No evidence of thrombus. Normal compressibility, respiratory phasicity and response to augmentation. Basilic Vein: No evidence of thrombus. Normal compressibility, respiratory phasicity and  response to augmentation. Brachial Veins: No evidence of thrombus. Normal compressibility, respiratory phasicity and response to augmentation. Radial Veins: No evidence of thrombus. Normal compressibility, respiratory phasicity and response to augmentation. Ulnar Veins: No evidence of thrombus. Normal compressibility, respiratory phasicity and response to augmentation. Venous Reflux:  None visualized. Other Findings:  Superficial subcutaneous edema. IMPRESSION: No evidence of DVT within the left upper extremity. Electronically Signed   By: Jacqulynn Cadet M.D.   On: 01/01/2019 16:09    STUDIES:  None  CULTURES: None  ANTIBIOTICS: Vancomycin Ciprofloxacin  SIGNIFICANT EVENTS: 01/01/2019: Admitted with cellulitis  LINES/TUBES: Peripheral IVs Tracheostomy tube  DISCUSSION: 58 year old male with chronic respiratory failure requiring tracheostomy and full vent support at night, admitted with left arm cellulitis and right lower extremity cellulitis  ASSESSMENT Cellulitis left elbow and right lower extremity Obstructive sleep apnea and obesity hypoventilation syndrome status post tracheostomy and full vent support at night Morbid obesity Type 2 diabetes with diabetic neuropathy Diastolic heart failure Atrial  fibrillation Hypertension Hyperlipidemia CAD status post MI Chronic pancreatitis  PLAN Antibiotics as above Trend procalcitonin and adjust antibiotics Full vent support at night with home settings.  Unfortunately patient does not really remember his home settings.  We will utilize vent settings from his last admission. Continue all home medications  Best Practice: Code Status: Full code Diet: Heart healthy GI prophylaxis: Not indicated VTE prophylaxis: Already on full-strength anticoagulation with Xarelto 20 mg daily  FAMILY  - Updates: Patient and fianc updated on current treatment plan  Magdalene S. Tukov-Yual ANP-BC Pulmonary and [place] Pager [phone] or [phone]  NB: This document was prepared using Dragon voice recognition software and may include unintentional dictation errors.    01/01/2019, 9:46 PM

## 2019-01-01 NOTE — ED Notes (Signed)
Repeat lactic sent to lab

## 2019-01-01 NOTE — ED Notes (Addendum)
Pt assisted to use bedside toilet. Pt adamant to urinate before having lines placed and meds started. Swelling to pt's legs, red/purple in places at lower legs.

## 2019-01-01 NOTE — ED Notes (Signed)
This RN back to bedside to send urine sample. Pt stated he already poured out of urinal when this RN had told him specifically that a urine sample was needed. Pt stated about 300cc of urine.

## 2019-01-01 NOTE — ED Notes (Signed)
Attempted report to floor.  

## 2019-01-01 NOTE — ED Notes (Addendum)
Pt given food tray and drink per orders. Pt understands that urine sample is still needed.

## 2019-01-01 NOTE — H&P (Signed)
[facility] NAME: [name]    MR#:  [id number]  DATE OF BIRTH:  10-03-1960  DATE OF ADMISSION:  01/01/2019  PRIMARY CARE PHYSICIAN: Denton Lank, MD   REQUESTING/REFERRING PHYSICIAN: Dr Carrie Mew  CHIEF COMPLAINT:  Left elbow pain and fever.  HISTORY OF PRESENT ILLNESS:  [name]  is a 58 y.o. male with a known history of sleep apnea obesity and congestive heart failure.  He presents to the hospital with a few days of left elbow pain.  It started night before last felt like a rug burn and started off a small spot on his left elbow.  He did have a fever of 101 last night had a fever of 100.  Then he started developing swelling of the elbow and redness started going down his forearm.  Hospitalist services were contacted for admission.  PAST MEDICAL HISTORY:   Past Medical History:  Diagnosis Date  . CHF (congestive heart failure) ([place])   . Diabetes mellitus ([place])   . Diabetic peripheral neuropathy ([place])   . Elevated liver enzymes    fatty liver per [facility]  . GERD (gastroesophageal reflux disease)   . History of pulmonary embolus (PE)   . Hyperlipidemia   . Hypertension   . Myocardial infarct ([address])   . OSA (obstructive sleep apnea)   . 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 Renal insufficiency     PAST SURGICAL HISTORY:   Past Surgical History:  Procedure Laterality Date  . CARDIAC SURGERY     With stent placement  . FLEXIBLE BRONCHOSCOPY N/A 07/28/2017   Procedure: FLEXIBLE BRONCHOSCOPY;  Surgeon: [name], MD;  Location: [facility] ORS;  Service: Pulmonary;  Laterality: N/A;  . LEFT HEART CATH AND CORONARY ANGIOGRAPHY N/A 09/01/2017   Procedure: LEFT HEART CATH AND CORONARY ANGIOGRAPHY and PCI stent;  Surgeon: Yolonda Kida, MD;  Location: [facility];  Service: Cardiovascular;  Laterality: N/A;  . pace maker  2019  . TRACHEOSTOMY  2007  .  VASECTOMY      SOCIAL HISTORY:   Social History   Tobacco Use  . Smoking status: Never Smoker  . Smokeless tobacco: Never Used  Substance Use Topics  . Alcohol use: Yes    Alcohol/week: 6.0 - 7.0 standard drinks    Types: 6 - 7 Cans of beer per week    FAMILY HISTORY:   Family History  Problem Relation Age of Onset  . Heart attack Mother   . Coronary artery disease Father   . Heart attack Father   . Kidney cancer Neg Hx   . Kidney disease Neg Hx   . Prostate cancer Neg Hx     DRUG ALLERGIES:   Allergies  Allergen Reactions  . Cephalexin     Other reaction(s): Unknown  . Hctz  [Hydrochlorothiazide]     Other reaction(s): Other (See Comments) hyponatremia  . Hydralazine   . Penicillins Hives    Has patient had a PCN reaction causing immediate rash, facial/tongue/throat swelling, SOB or lightheadedness with hypotension: Yes Has patient had a PCN reaction causing severe rash involving mucus membranes or skin necrosis: Yes Has patient had a PCN reaction that required hospitalization: Unknown Has patient had a PCN reaction occurring within the last 10 years: No If all of the above answers are "NO", then may proceed with Cephalosporin use.   Marland Kitchen Reserpine   . Zaroxolyn [Metolazone]  Other reaction(s): Unknown    REVIEW OF SYSTEMS:  CONSTITUTIONAL: Positive for fever, chills and sweats.  Positive for weight loss 60 pounds over 3 years no fatigue or weakness.  EYES: No blurred or double vision.  Wears glasses. EARS, NOSE, AND THROAT: No tinnitus or ear pain. No sore throat.  Positive postnasal drip RESPIRATORY: No cough, some shortness of breath, no wheezing or hemoptysis.  CARDIOVASCULAR: No chest pain, orthopnea, edema.  GASTROINTESTINAL: No nausea, vomiting, diarrhea or abdominal pain. No blood in bowel movements GENITOURINARY: No dysuria, hematuria.  ENDOCRINE: No polyuria, nocturia,  HEMATOLOGY: No anemia, easy bruising or bleeding SKIN: No rash or  lesion. MUSCULOSKELETAL: Left elbow pain NEUROLOGIC: No tingling, numbness, weakness.  PSYCHIATRY: No anxiety or depression.   MEDICATIONS AT HOME:   Prior to Admission medications   Medication Sig Start Date End Date Taking? Authorizing Provider  acetaminophen (TYLENOL) 325 MG tablet Take 1 tablet (325 mg total) by mouth every 6 (six) hours as needed for mild pain (or Fever >/= 101). 09/02/17   Gouru, Illene Silver, MD  ALPRAZolam Duanne Moron) 0.5 MG tablet Take 1 tablet (0.5 mg total) by mouth every 8 (eight) hours as needed for anxiety. 09/02/17   Nicholes Mango, MD  AMBULATORY NON FORMULARY MEDICATION Medication Name: Trachea collar for use with nebulizer DX: J96.10 12/09/17   [name], MD  AMBULATORY NON FORMULARY MEDICATION Medication Name: Nebulizer DX: J96.10 12/09/17   [name], MD  amiodarone (PACERONE) 200 MG tablet Take 200 mg by mouth 2 (two) times daily. 09/24/17   [provider]  atorvastatin (LIPITOR) 40 MG tablet Take 1 tablet (40 mg total) by mouth daily at 6 PM. 11/04/16   Flora Lipps, MD  atorvastatin (LIPITOR) 80 MG tablet Take 80 mg by mouth at bedtime. 08/23/17   [provider]  budesonide (PULMICORT) 0.5 MG/2ML nebulizer solution Take 2 mLs (0.5 mg total) by nebulization 2 (two) times daily. 02/27/18   [name], MD  busPIRone (BUSPAR) 10 MG tablet Take 10 mg by mouth 2 (two) times daily as needed.     [provider]  collagenase (SANTYL) ointment Apply topically daily. 07/20/17   Dustin Flock, MD  enalapril (VASOTEC) 2.5 MG tablet Take 1 tablet (2.5 mg total) by mouth daily. 10/09/17   Hillary Bow, MD  feeding supplement, GLUCERNA SHAKE, (GLUCERNA SHAKE) LIQD Take 237 mLs by mouth 2 (two) times daily between meals. 09/02/17   Nicholes Mango, MD  folic acid (FOLVITE) 1 MG tablet Take 1 tablet (1 mg total) by mouth daily. 09/03/17   Nicholes Mango, MD  furosemide (LASIX) 40 MG tablet Take 1 tablet (40 mg total) by mouth daily.  10/09/17 11/08/17  Hillary Bow, MD  gabapentin (NEURONTIN) 800 MG tablet Take 1 tablet (800 mg total) by mouth 3 (three) times daily. 1 tablet twice daily Patient taking differently: Take 1,600 mg by mouth 2 (two) times daily.  09/02/17   Gouru, Illene Silver, MD  insulin detemir (LEVEMIR) 100 unit/ml SOLN Inject 20 Units into the skin 2 (two) times daily.     [provider]  insulin lispro (HUMALOG) 100 UNIT/ML injection Inject 2-10 Units into the skin 3 (three) times daily with meals.     [provider]  ipratropium-albuterol (DUONEB) 0.5-2.5 (3) MG/3ML SOLN Take 3 mLs by nebulization 3 (three) times daily. 12/09/17   [name], MD  lamoTRIgine (LAMICTAL) 150 MG tablet Take 150 mg by mouth 2 (two) times daily.  07/02/17   [provider]  metFORMIN (  GLUCOPHAGE-XR) 500 MG 24 hr tablet Take 1,000 mg by mouth 2 (two) times daily.  01/24/14   [provider]  metoprolol tartrate 75 MG TABS Take 75 mg by mouth 2 (two) times daily. 100 mg twice daily 10/09/17   Hillary Bow, MD  Multiple Vitamin (MULTIVITAMIN WITH MINERALS) TABS tablet Take 1 tablet by mouth daily. 09/03/17   Gouru, Illene Silver, MD  omeprazole (PRILOSEC) 20 MG capsule Take 20 mg by mouth daily. Once daily    [provider]  oxyCODONE-acetaminophen (PERCOCET) 10-325 MG tablet Take 1 tablet by mouth 4 (four) times daily as needed for pain.     [provider]  polyethylene glycol (MIRALAX / GLYCOLAX) packet Take 17 g by mouth daily as needed for mild constipation. 09/02/17   Nicholes Mango, MD  rivaroxaban (XARELTO) 20 MG TABS tablet Take 20 mg by mouth daily.    [provider]  senna-docusate (SENOKOT-S) 8.6-50 MG tablet Take 2 tablets by mouth at bedtime as needed for mild constipation. 09/02/17   Nicholes Mango, MD  thiamine 100 MG tablet Take 1 tablet (100 mg total) by mouth daily. 09/03/17   Gouru, Illene Silver, MD      VITAL SIGNS:  Blood pressure 104/62, pulse 61, temperature 99.3 F  (37.4 C), temperature source Oral, resp. rate 16, height 6' (1.829 m), weight 136.1 kg, SpO2 95 %.  PHYSICAL EXAMINATION:  GENERAL:  58 y.o.-year-old patient lying in the bed with no acute distress.  EYES: Pupils equal, round, reactive to light and accommodation. No scleral icterus. Extraocular muscles intact.  HEENT: Head atraumatic, normocephalic. Oropharynx and nasopharynx clear.  NECK:  Supple, no jugular venous distention. No thyroid enlargement, no tenderness.  LUNGS: Normal breath sounds bilaterally, no wheezing, rales,rhonchi or crepitation. No use of accessory muscles of respiration.  CARDIOVASCULAR: S1, S2 normal. No murmurs, rubs, or gallops.  ABDOMEN: Soft, nontender, nondistended. Bowel sounds present. No organomegaly or mass.  EXTREMITIES: 3+ pedal edema, no cyanosis, or clubbing.  Good range of motion left elbow NEUROLOGIC: Cranial nerves II through XII are intact. Muscle strength 5/5 in all extremities. Sensation intact. Gait not checked.  PSYCHIATRIC: [name].  SKIN: Right lower extremity erythema with a few open wounds, chronic lower extremity discoloration left lower extremity, erythema from the left elbow down to the left wrist  LABORATORY PANEL:   CBC Recent Labs  Lab 01/01/19 1401  WBC 10.4  HGB 12.0*  HCT 35.9*  PLT 234   ------------------------------------------------------------------------------------------------------------------  Chemistries  Recent Labs  Lab 01/01/19 1401  NA 131*  K 3.8  CL 91*  CO2 27  GLUCOSE 214*  BUN 22*  CREATININE 1.13  CALCIUM 8.7*  AST 33  ALT 28  ALKPHOS 79  BILITOT 0.8   ------------------------------------------------------------------------------------------------------------------    RADIOLOGY:  Dg Elbow Complete Left  Result Date: 01/01/2019 CLINICAL DATA:  LEFT elbow pain, swelling, bruising, redness and warmth, denies injury EXAM: LEFT ELBOW - COMPLETE 3+ VIEW COMPARISON:   None FINDINGS: Diffuse soft tissue swelling at LEFT elbow region. Osseous mineralization normal. Joint spaces preserved. No acute fracture, dislocation, or bone destruction. No elbow joint effusion. IMPRESSION: Significant soft tissue swelling at the LEFT elbow region. No acute osseous abnormalities. Electronically Signed   By: Lavonia Dana M.D.   On: 01/01/2019 14:40   US Venous Img Upper Uni Left  Result Date: 01/01/2019 CLINICAL DATA:  58 year old male with left arm pain, swelling and redness EXAM: LEFT UPPER EXTREMITY VENOUS DOPPLER ULTRASOUND  TECHNIQUE: Gray-scale sonography with graded compression, as well as color Doppler and duplex ultrasound were performed to evaluate the upper extremity deep venous system from the level of the subclavian vein and including the jugular, axillary, basilic, radial, ulnar and upper cephalic vein. Spectral Doppler was utilized to evaluate flow at rest and with distal augmentation maneuvers. COMPARISON:  None. FINDINGS: Contralateral Subclavian Vein: Respiratory phasicity is normal and symmetric with the symptomatic side. No evidence of thrombus. Normal compressibility. Internal Jugular Vein: No evidence of thrombus. Normal compressibility, respiratory phasicity and response to augmentation. Subclavian Vein: No evidence of thrombus. Normal compressibility, respiratory phasicity and response to augmentation. Axillary Vein: No evidence of thrombus. Normal compressibility, respiratory phasicity and response to augmentation. Cephalic Vein: No evidence of thrombus. Normal compressibility, respiratory phasicity and response to augmentation. Basilic Vein: No evidence of thrombus. Normal compressibility, respiratory phasicity and response to augmentation. Brachial Veins: No evidence of thrombus. Normal compressibility, respiratory phasicity and response to augmentation. Radial Veins: No evidence of thrombus. Normal compressibility, respiratory phasicity and response to augmentation.  Ulnar Veins: No evidence of thrombus. Normal compressibility, respiratory phasicity and response to augmentation. Venous Reflux:  None visualized. Other Findings:  Superficial subcutaneous edema. IMPRESSION: No evidence of DVT within the left upper extremity. Electronically Signed   By: Jacqulynn Cadet M.D.   On: 01/01/2019 16:09      IMPRESSION AND PLAN:   1.  Cellulitis left arm and right lower extremity.  Patient has allergy to penicillin and Keflex.  Vancomycin and Cipro ordered.  Case discussed with pharmacist. 2.  Morbid obesity with sleep apnea.  Patient states that he sleeps with a ventilator at home.  I spoke with the nursing supervisor and this cannot be done on the floor so I switched his admission to CCU stepdown and spoke with Dr. Alva Garnet.  When I spoke with the patient's fianc she did not have the patient's settings.  She will get them ready for when the CCU calls her later. 3.  Morbid obesity with a BMI of 40.69.  Weight loss needed. 4.  Type 2 diabetes mellitus put on sliding scale insulin and Lantus. 5.  Chronic diastolic congestive heart failure and edema.  Increase Lasix to twice daily dosing 6.  History of CAD and paroxysmal atrial fibrillation.  Decrease dose of amiodarone to 100 twice daily continue metoprolol and Lipitor.  Patient also anticoagulated with Xarelto 7.  Advised to cut down on alcohol.  Patient states he does not get shaky if he does not drink.  Continue thiamine 8.  Diabetic neuropathy on large dose of gabapentin   All the records are reviewed and case discussed with ED provider. Management plans discussed with the patient, family and they are in agreement.  CODE STATUS: Full code  TOTAL TIME TAKING CARE OF THIS PATIENT: 50 minutes.    Loletha Grayer M.D on 01/01/2019 at 4:53 PM  Between 7am to 6pm - Pager - [phone]  After 6pm call admission pager [phone]  [facility]  [phone]  CC: Primary care physician; Denton Lank,  MD

## 2019-01-01 NOTE — ED Triage Notes (Signed)
Pt here with c/o L elbow swelling and R leg swelling. Redness noted to R leg, L elbow significantly swollen and painful. Pt is high risk population, had a trach.

## 2019-01-01 NOTE — ED Notes (Signed)
Flagyl switched to run through L ac Iv as pt c/o pain at R ac IV. Line checked, flushes well, and gives back blood.

## 2019-01-01 NOTE — ED Provider Notes (Signed)
[facility] Emergency Department Provider Note  ____________________________________________  Time seen: Approximately 4:13 PM  I have reviewed the triage vital signs and the nursing notes.   HISTORY  Chief Complaint No chief complaint on file.    HPI Averi Cacioppo is a 58 y.o. male with a history of CHF, diabetes, hypertension, CAD, pulmonary embolism who comes the ED complaining of pain swelling and redness of the left arm from the elbow down to the wrist over the past 2 days, associated fever to 101 at home.  Pain is constant, worse with movement and pressure, no alleviating factors, nonradiating.  Denies chest pain or shortness of breath.  He is on Xarelto for anticoagulation which he has been compliant with.      Past Medical History:  Diagnosis Date  . CHF (congestive heart failure) ([place])   . Diabetes mellitus ([place])   . Diabetic peripheral neuropathy ([place])   . Elevated liver enzymes    fatty liver per [facility]  . GERD (gastroesophageal reflux disease)   . History of pulmonary embolus (PE)   . Hyperlipidemia   . Hypertension   . Myocardial infarct ([place])   . OSA (obstructive sleep apnea)   . 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 Renal insufficiency      Patient Active Problem List   Diagnosis Date Noted  . RUQ pain   . Atrial fibrillation ([place]) 11/02/2016  . Chest pain 11/02/2016  . Hyponatremia 11/02/2016  . Chronic respiratory failure, unspecified whether with hypoxia or hypercapnia ([place]) 05/16/2014  . Ventilator dependence ([place]) 05/16/2014  . Obesity hypoventilation syndrome ([place]) 05/16/2014  . 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 CARDIAC SURGERY     With stent placement  . FLEXIBLE BRONCHOSCOPY N/A 07/28/2017   Procedure: FLEXIBLE BRONCHOSCOPY;  Surgeon: [name], MD;  Location: [facility] ORS;  Service: Pulmonary;  Laterality: N/A;   . LEFT HEART CATH AND CORONARY ANGIOGRAPHY N/A 09/01/2017   Procedure: LEFT HEART CATH AND CORONARY ANGIOGRAPHY and PCI stent;  Surgeon: Yolonda Kida, MD;  Location: [facility];  Service: Cardiovascular;  Laterality: N/A;  . pace maker  2019  . TRACHEOSTOMY  2007  . VASECTOMY       Prior to Admission medications   Medication Sig Start Date End Date Taking? Authorizing Provider  acetaminophen (TYLENOL) 325 MG tablet Take 1 tablet (325 mg total) by mouth every 6 (six) hours as needed for mild pain (or Fever >/= 101). 09/02/17   Gouru, Illene Silver, MD  ALPRAZolam Duanne Moron) 0.5 MG tablet Take 1 tablet (0.5 mg total) by mouth every 8 (eight) hours as needed for anxiety. 09/02/17   Nicholes Mango, MD  AMBULATORY NON FORMULARY MEDICATION Medication Name: Trachea collar for use with nebulizer DX: J96.10 12/09/17   [name], MD  AMBULATORY NON FORMULARY MEDICATION Medication Name: Nebulizer DX: J96.10 12/09/17   [name], MD  amiodarone (PACERONE) 200 MG tablet Take 200 mg by mouth 2 (two) times daily. 09/24/17   [provider]  atorvastatin (LIPITOR) 40 MG tablet Take 1 tablet (40 mg total) by mouth daily at 6 PM. 11/04/16   Flora Lipps, MD  atorvastatin (LIPITOR) 80 MG tablet Take 80 mg by mouth at bedtime. 08/23/17   [provider]  budesonide (PULMICORT) 0.5 MG/2ML nebulizer solution Take 2 mLs (0.5 mg total) by nebulization 2 (two) times daily. 02/27/18   Ashby Dawes,  Pradeep, MD  busPIRone (BUSPAR) 10 MG tablet Take 10 mg by mouth 2 (two) times daily as needed.     [provider]  collagenase (SANTYL) ointment Apply topically daily. 07/20/17   Dustin Flock, MD  enalapril (VASOTEC) 2.5 MG tablet Take 1 tablet (2.5 mg total) by mouth daily. 10/09/17   Hillary Bow, MD  feeding supplement, GLUCERNA SHAKE, (GLUCERNA SHAKE) LIQD Take 237 mLs by mouth 2 (two) times daily between meals. 09/02/17   Nicholes Mango, MD  folic acid (FOLVITE) 1 MG tablet  Take 1 tablet (1 mg total) by mouth daily. 09/03/17   Nicholes Mango, MD  furosemide (LASIX) 40 MG tablet Take 1 tablet (40 mg total) by mouth daily. 10/09/17 11/08/17  Hillary Bow, MD  gabapentin (NEURONTIN) 800 MG tablet Take 1 tablet (800 mg total) by mouth 3 (three) times daily. 1 tablet twice daily Patient taking differently: Take 1,600 mg by mouth 2 (two) times daily.  09/02/17   Gouru, Illene Silver, MD  insulin detemir (LEVEMIR) 100 unit/ml SOLN Inject 20 Units into the skin 2 (two) times daily.     [provider]  insulin lispro (HUMALOG) 100 UNIT/ML injection Inject 2-10 Units into the skin 3 (three) times daily with meals.     [provider]  ipratropium-albuterol (DUONEB) 0.5-2.5 (3) MG/3ML SOLN Take 3 mLs by nebulization 3 (three) times daily. 12/09/17   [name], MD  lamoTRIgine (LAMICTAL) 150 MG tablet Take 150 mg by mouth 2 (two) times daily.  07/02/17   [provider]  metFORMIN (GLUCOPHAGE-XR) 500 MG 24 hr tablet Take 1,000 mg by mouth 2 (two) times daily.  01/24/14   [provider]  metoprolol tartrate 75 MG TABS Take 75 mg by mouth 2 (two) times daily. 100 mg twice daily 10/09/17   Hillary Bow, MD  Multiple Vitamin (MULTIVITAMIN WITH MINERALS) TABS tablet Take 1 tablet by mouth daily. 09/03/17   Gouru, Illene Silver, MD  omeprazole (PRILOSEC) 20 MG capsule Take 20 mg by mouth daily. Once daily    [provider]  oxyCODONE-acetaminophen (PERCOCET) 10-325 MG tablet Take 1 tablet by mouth 4 (four) times daily as needed for pain.     [provider]  polyethylene glycol (MIRALAX / GLYCOLAX) packet Take 17 g by mouth daily as needed for mild constipation. 09/02/17   Nicholes Mango, MD  rivaroxaban (XARELTO) 20 MG TABS tablet Take 20 mg by mouth daily.    [provider]  senna-docusate (SENOKOT-S) 8.6-50 MG tablet Take 2 tablets by mouth at bedtime as needed for mild constipation. 09/02/17   Nicholes Mango, MD  thiamine 100 MG tablet  Take 1 tablet (100 mg total) by mouth daily. 09/03/17   Nicholes Mango, MD     Allergies Cephalexin, Hctz  [hydrochlorothiazide], Hydralazine, Penicillins, Reserpine, and Zaroxolyn [metolazone]   Family History  Problem Relation Age of Onset  . Heart attack Mother   . Coronary artery disease Father   . Heart attack Father   . Kidney cancer Neg Hx   . Kidney disease Neg Hx   . Prostate cancer Neg Hx     Social History Social History   Tobacco Use  . Smoking status: Never Smoker  . Smokeless tobacco: Never Used  Substance Use Topics  . Alcohol use: Yes    Alcohol/week: 6.0 - 7.0 standard drinks    Types: 6 - 7 Cans of beer per week  . Drug use: No    Review of Systems  Constitutional:  Positive fever ENT:   No sore throat. No rhinorrhea. Cardiovascular:   No chest pain or syncope. Respiratory:   No dyspnea or cough. Gastrointestinal:   Negative for abdominal pain, vomiting and diarrhea.  Musculoskeletal:   Positive left arm pain as above All other systems reviewed and are negative except as documented above in ROS and HPI.  ____________________________________________   PHYSICAL EXAM:  VITAL SIGNS: ED Triage Vitals  Enc Vitals Group     BP 01/01/19 1350 104/62     Pulse Rate 01/01/19 1350 61     Resp 01/01/19 1350 16     Temp 01/01/19 1350 99.3 F (37.4 C)     Temp Source 01/01/19 1350 Oral     SpO2 01/01/19 1350 95 %     Weight 01/01/19 1604 300 lb (136.1 kg)     Height 01/01/19 1604 6' (1.829 m)     Head Circumference --      Peak Flow --      Pain Score 01/01/19 1350 0     Pain Loc --      Pain Edu? --      Excl. in [place]? --     Vital signs reviewed, nursing assessments reviewed.   Constitutional:   Alert and oriented. Non-toxic appearance.  Morbidly obese Eyes:   Conjunctivae are normal. EOMI. PERRL. ENT      Head:   Normocephalic and atraumatic.      Nose:   No congestion/rhinnorhea.       Mouth/Throat:   MMM, no pharyngeal erythema. No  peritonsillar mass.       Neck:   No meningismus. Full ROM. Hematological/Lymphatic/Immunilogical:   No cervical lymphadenopathy. Cardiovascular:   RRR. Symmetric bilateral radial and DP pulses.  No murmurs. Cap refill less than 2 seconds. Respiratory:   Normal respiratory effort without tachypnea/retractions. Breath sounds are clear and equal bilaterally. No wheezes/rales/rhonchi. Gastrointestinal:   Soft and nontender. Non distended. There is no CVA tenderness.  No rebound, rigidity, or guarding.  Musculoskeletal: Pain limited range of motion in the left elbow, but the elbow is able to be ranged without severe pain.  There is swelling about the left elbow and forearm with erythema warmth and tenderness consistent with cellulitis.  No fluctuance or crepitus.  No open wounds.  Bilateral lower extremities are edematous with chronic skin changes of venous stasis. Neurologic:   Normal speech and language.  Motor grossly intact. No acute focal neurologic deficits are appreciated.  Skin:    Skin is warm, dry with chronic superficial wounds of bilateral shins. No rash noted.  No petechiae, purpura, or bullae.  ____________________________________________    LABS (pertinent positives/negatives) (all labs ordered are listed, but only abnormal results are displayed) Labs Reviewed  COMPREHENSIVE METABOLIC PANEL - Abnormal; Notable for the following components:      Result Value   Sodium 131 (*)    Chloride 91 (*)    Glucose, Bld 214 (*)    BUN 22 (*)    Calcium 8.7 (*)    All other components within normal limits  LACTIC ACID, PLASMA - Abnormal; Notable for the following components:   Lactic Acid, Venous 2.2 (*)    All other components within normal limits  CBC WITH DIFFERENTIAL/PLATELET - Abnormal; Notable for the following components:   RBC 4.11 (*)    Hemoglobin 12.0 (*)    HCT 35.9 (*)    Neutro Abs 7.9 (*)    Monocytes Absolute 1.1 (*)    All  other components within normal limits   PROTIME-INR - Abnormal; Notable for the following components:   Prothrombin Time 33.7 (*)    INR 3.4 (*)    All other components within normal limits  SARS CORONAVIRUS 2 (HOSPITAL ORDER, PERFORMED IN [facility])  URINALYSIS, COMPLETE (UACMP) WITH MICROSCOPIC   ____________________________________________   EKG    ____________________________________________    RADIOLOGY  Dg Elbow Complete Left  Result Date: 01/01/2019 CLINICAL DATA:  LEFT elbow pain, swelling, bruising, redness and warmth, denies injury EXAM: LEFT ELBOW - COMPLETE 3+ VIEW COMPARISON:  None FINDINGS: Diffuse soft tissue swelling at LEFT elbow region. Osseous mineralization normal. Joint spaces preserved. No acute fracture, dislocation, or bone destruction. No elbow joint effusion. IMPRESSION: Significant soft tissue swelling at the LEFT elbow region. No acute osseous abnormalities. Electronically Signed   By: Lavonia Dana M.D.   On: 01/01/2019 14:40   US Venous Img Upper Uni Left  Result Date: 01/01/2019 CLINICAL DATA:  58 year old male with left arm pain, swelling and redness EXAM: LEFT UPPER EXTREMITY VENOUS DOPPLER ULTRASOUND TECHNIQUE: Gray-scale sonography with graded compression, as well as color Doppler and duplex ultrasound were performed to evaluate the upper extremity deep venous system from the level of the subclavian vein and including the jugular, axillary, basilic, radial, ulnar and upper cephalic vein. Spectral Doppler was utilized to evaluate flow at rest and with distal augmentation maneuvers. COMPARISON:  None. FINDINGS: Contralateral Subclavian Vein: Respiratory phasicity is normal and symmetric with the symptomatic side. No evidence of thrombus. Normal compressibility. Internal Jugular Vein: No evidence of thrombus. Normal compressibility, respiratory phasicity and response to augmentation. Subclavian Vein: No evidence of thrombus. Normal compressibility, respiratory phasicity and response  to augmentation. Axillary Vein: No evidence of thrombus. Normal compressibility, respiratory phasicity and response to augmentation. Cephalic Vein: No evidence of thrombus. Normal compressibility, respiratory phasicity and response to augmentation. Basilic Vein: No evidence of thrombus. Normal compressibility, respiratory phasicity and response to augmentation. Brachial Veins: No evidence of thrombus. Normal compressibility, respiratory phasicity and response to augmentation. Radial Veins: No evidence of thrombus. Normal compressibility, respiratory phasicity and response to augmentation. Ulnar Veins: No evidence of thrombus. Normal compressibility, respiratory phasicity and response to augmentation. Venous Reflux:  None visualized. Other Findings:  Superficial subcutaneous edema. IMPRESSION: No evidence of DVT within the left upper extremity. Electronically Signed   By: Jacqulynn Cadet M.D.   On: 01/01/2019 16:09    ____________________________________________   PROCEDURES Procedures  ____________________________________________  DIFFERENTIAL DIAGNOSIS   Cellulitis of left arm.  Doubt DVT septic arthritis necrotizing fasciitis abscess or osteomyelitis.  CLINICAL IMPRESSION / ASSESSMENT AND PLAN / ED COURSE  Medications ordered in the ED: Medications  ceFEPIme (MAXIPIME) 2 g in sodium chloride 0.9 % 100 mL IVPB (has no administration in time range)  metroNIDAZOLE (FLAGYL) IVPB 500 mg (has no administration in time range)  vancomycin (VANCOCIN) IVPB 1000 mg/200 mL premix (has no administration in time range)    Pertinent labs & imaging results that were available during my care of the patient were reviewed by me and considered in my medical decision making (see chart for details).  Wayne Wicklund was evaluated in Emergency Department on 01/01/2019 for the symptoms described in the history of present illness. He was evaluated in the context of the global COVID-19 pandemic, which  necessitated consideration that the patient might be at risk for infection with the SARS-CoV-2 virus that causes COVID-19. Institutional protocols and algorithms that pertain to the evaluation of patients  at risk for COVID-19 are in a state of rapid change based on information released by regulatory bodies including the CDC and federal and state organizations. These policies and algorithms were followed during the patient's care in the ED.   Patient presents with left arm pain swelling and redness.  Clinically consistent with cellulitis.  X-ray does show a joint effusion of the left but with his ability to allow range of motion of the left elbow, it is doubtful that he has a septic joint.  Additionally, the cellulitis does overlie the joint making diagnostic arthrocentesis contraindicated.  Plan to start IV antibiotics for cellulitis, admit to hospital for further management including reassessing joint once cellulitis is improved to ensure that arthrocentesis is not needed at a later time.  In the ED vital signs are normal and the patient is not septic.  Offered analgesics, patient declines for now.  He and I agreed to continue his home dose of Percocet on an as-needed basis which he normally takes 3 times a day as needed.  I encouraged him to request additional analgesics as needed.      ____________________________________________   FINAL CLINICAL IMPRESSION(S) / ED DIAGNOSES    Final diagnoses:  Cellulitis of left upper extremity  Type 2 diabetes mellitus with other specified complication, unspecified whether long term insulin use (HCC)  Effusion, left elbow     ED Discharge Orders    None      Portions of this note were generated with dragon dictation software. Dictation errors may occur despite best attempts at proofreading.   Carrie Mew, MD 01/01/19 [phone]

## 2019-01-01 NOTE — ED Notes (Signed)
ICU reports bed assignment just changed. Will give charge time to assign and call again.

## 2019-01-01 NOTE — ED Notes (Signed)
Floor RN called this RN back. Report given to floor.

## 2019-01-02 DIAGNOSIS — L03114 Cellulitis of left upper limb: Secondary | ICD-10-CM

## 2019-01-02 DIAGNOSIS — J9610 Chronic respiratory failure, unspecified whether with hypoxia or hypercapnia: Secondary | ICD-10-CM

## 2019-01-02 LAB — CREATININE, SERUM
Creatinine, Ser: 1.03 mg/dL (ref 0.61–1.24)
GFR calc Af Amer: >60 mL/min (ref >60)
GFR calc non Af Amer: >60 mL/min (ref >60)

## 2019-01-02 LAB — GLUCOSE, CAPILLARY
Glucose-Capillary: 183 mg/dL — ABNORMAL HIGH (ref 70–99)
Glucose-Capillary: 208 mg/dL — ABNORMAL HIGH (ref 70–99)
Glucose-Capillary: 178 mg/dL — ABNORMAL HIGH (ref 70–99)
Glucose-Capillary: 178 mg/dL — ABNORMAL HIGH (ref 70–99)

## 2019-01-02 LAB — HEMOGLOBIN A1C
Hgb A1c MFr Bld: 7.3 % — ABNORMAL HIGH (ref 4.8–5.6)
Mean Plasma Glucose: 162.81 mg/dL

## 2019-01-02 MED ORDER — VANCOMYCIN HCL 1.25 G IV SOLR
1250.0000 mg | Freq: Two times a day (BID) | INTRAVENOUS | Status: DC
  Administered 2019-01-02 – 2019-01-05 (×7): 1250 mg via INTRAVENOUS
  Filled 2019-01-02 (×10): qty 1250

## 2019-01-02 MED ORDER — IBUPROFEN 400 MG PO TABS
400.0000 mg | ORAL_TABLET | Freq: Four times a day (QID) | ORAL | Status: DC | PRN
  Administered 2019-01-02 (×2): 400 mg via ORAL
  Filled 2019-01-02 (×2): qty 1

## 2019-01-02 MED ORDER — METOPROLOL TARTRATE 50 MG PO TABS
50.0000 mg | ORAL_TABLET | Freq: Two times a day (BID) | ORAL | Status: DC
  Administered 2019-01-02 – 2019-01-09 (×14): 50 mg via ORAL
  Filled 2019-01-02 (×14): qty 1

## 2019-01-02 NOTE — Progress Notes (Signed)
Sitting up in chair.  Room air.  No distress.  Cognition intact.  Vitals:   01/02/19 0300 01/02/19 0330 01/02/19 0800 01/02/19 1041  BP:   119/76   Pulse: (!) 59   (!) 52  Resp: 14     Temp:   98.7 F (37.1 C)   TempSrc:   Oral   SpO2: 100% 100%    Weight:      Height:        HEENT WNL Trach site clean Chest clear Cardiac: RRR, no M Abdomen: Obese, soft, NABS Extremities: Symmetric BLE edema with chronic stasis changes.  LUE with edema and erythema.  Tender around the L elbow  BMP Latest Ref Rng & Units 01/02/2019 01/01/2019 01/27/2018  Glucose 70 - 99 mg/dL - 214(H) 118(H)  BUN 6 - 20 mg/dL - 22(H) 27(H)  Creatinine 0.61 - 1.24 mg/dL 1.03 1.13 1.40(H)  Sodium 135 - 145 mmol/L - 131(L) 135  Potassium 3.5 - 5.1 mmol/L - 3.8 4.2  Chloride 98 - 111 mmol/L - 91(L) 95(L)  CO2 22 - 32 mmol/L - 27 32  Calcium 8.9 - 10.3 mg/dL - 8.7(L) 8.9   CBC Latest Ref Rng & Units 01/01/2019 01/27/2018 10/24/2017  WBC 4.0 - 10.5 K/uL 10.4 5.2 7.7  Hemoglobin 13.0 - 17.0 g/dL 12.0(L) 11.3(L) 11.0(L)  Hematocrit 39.0 - 52.0 % 35.9(L) 33.9(L) 32.6(L)  Platelets 150 - 400 K/uL 234 198 259   CXR: No recent film  IMPRESSION: OSA/OHS with hx of UAO, chronic trach and chronic nocturnal vent (settings 18/8) LUE cellulitis, ?L elbow bursitis LUE pain  PLAN/REC: We are housing him in ICU/SDU due to requirement for nocturnal ventilation Continue current antibiotics Since no blood cultures are drawn, we are unlikely to have a culture to direct [place] When he meets criteria for discharge, would consider levofloxacin and complete 10 days If fails to improve as expected, would consider further imaging with CT  Add ibuprofen PRN to analgesia regimen  Merton Border, MD PCCM service Mobile [phone] Pager [phone] 01/02/2019 11:51 AM

## 2019-01-02 NOTE — Treatment Plan (Signed)
Patient has a #8 cuffless Shiley trach in place. Patient wears home vent. Patient arrived to ICU. Patient and wife could not provide home settings. Patient initially placed on PRVC. Patient did not tolerate. Patient was then placed on PSV mode 18/8 40%. Tolerated settings well. Patient wanted these settings noted in his chart for his next visit should he have a need.

## 2019-01-02 NOTE — Progress Notes (Signed)
Pharmacy Antibiotic Note  [name] is a 58 y.o. male admitted on 01/01/2019 with left elbow and RLE cellulitis.  Pharmacy has been consulted for cipro and vancomycin dosing.  Pt received cipro 400 mg IV x1 at 1726 in the ED Pt ordered vancomycin 1500 mg given at 1915. Pt did not receive the vancomycin 1000 mg.   Plan: Cipro 400 mg IV q12h   Vancomycin 1250 mg IV Q 12 hrs. Goal AUC 400-550. Expected AUC: 472.5  SCr used: 1.03 Expected Cmin: 13.4  Follow up SCr in AM to monitor renal function. Plan to draw level for Monday.   Height: 6' 0.01" (182.9 cm) Weight: (!) 301 lb 9.4 oz (136.8 kg) IBW/kg (Calculated) : 77.62  Temp (24hrs), Avg:98.6 F (37 C), Min:98.1 F (36.7 C), Max:99.3 F (37.4 C)  Recent Labs  Lab 01/01/19 1401 01/01/19 1703 01/02/19 0548  WBC 10.4  --   --   CREATININE 1.13  --  1.03  LATICACIDVEN 2.2* 1.8  --     Estimated Creatinine Clearance: 114.7 mL/min (by C-G formula based on SCr of 1.03 mg/dL).    Allergies  Allergen Reactions  . Cephalexin     Other reaction(s): Unknown  . Hctz  [Hydrochlorothiazide]     Other reaction(s): Other (See Comments) hyponatremia  . Hydralazine   . Penicillins Hives    Has patient had a PCN reaction causing immediate rash, facial/tongue/throat swelling, SOB or lightheadedness with hypotension: Yes Has patient had a PCN reaction causing severe rash involving mucus membranes or skin necrosis: Yes Has patient had a PCN reaction that required hospitalization: Unknown Has patient had a PCN reaction occurring within the last 10 years: No If all of the above answers are "NO", then may proceed with Cephalosporin use.   Marland Kitchen Reserpine   . Zaroxolyn [Metolazone]     Other reaction(s): Unknown    Antimicrobials this admission: Cipro/vanc 7/10 >>  Dose adjustments this admission: None  Microbiology results: MRSA PCR negative.   Thank you for allowing pharmacy to be a part of this patient's care.  Oswald Hillock, PharmD, BCPS  01/02/2019 8:14 AM

## 2019-01-02 NOTE — Progress Notes (Signed)
[facility] - [place] at [facility]   PATIENT NAME: Darl PikesJim Hoog    MR#:  [id number]  DATE OF BIRTH:  May 16, 1961  SUBJECTIVE:  CHIEF COMPLAINT:  No chief complaint on file.  Came with cellulitis and admitted to stepdown because he has tracheostomy for sleep apnea and uses ventilator every night.  No new complaints with REVIEW OF SYSTEMS:  CONSTITUTIONAL: No fever, fatigue or weakness.  EYES: No blurred or double vision.  EARS, NOSE, AND THROAT: No tinnitus or ear pain.  RESPIRATORY: No cough, shortness of breath, wheezing or hemoptysis.  CARDIOVASCULAR: No chest pain, orthopnea, edema.  GASTROINTESTINAL: No nausea, vomiting, diarrhea or abdominal pain.  GENITOURINARY: No dysuria, hematuria.  ENDOCRINE: No polyuria, nocturia,  HEMATOLOGY: No anemia, easy bruising or bleeding SKIN: No rash or lesion. MUSCULOSKELETAL: No joint pain or arthritis.   NEUROLOGIC: No tingling, numbness, weakness.  PSYCHIATRY: No anxiety or depression.   ROS  DRUG ALLERGIES:   Allergies  Allergen Reactions  . Cephalexin     Other reaction(s): Unknown  . Hctz  [Hydrochlorothiazide]     Other reaction(s): Other (See Comments) hyponatremia  . Hydralazine   . Penicillins Hives    Has patient had a PCN reaction causing immediate rash, facial/tongue/throat swelling, SOB or lightheadedness with hypotension: Yes Has patient had a PCN reaction causing severe rash involving mucus membranes or skin necrosis: Yes Has patient had a PCN reaction that required hospitalization: Unknown Has patient had a PCN reaction occurring within the last 10 years: No If all of the above answers are "NO", then may proceed with Cephalosporin use.   Marland Kitchen. Reserpine   . Zaroxolyn [Metolazone]     Other reaction(s): Unknown    VITALS:  Blood pressure 119/76, pulse (!) 52, temperature 98.6 F (37 C), temperature source Oral, resp. rate 13, height 6' 0.01" (1.829 m), weight (!) 136.8 kg, SpO2 97 %.  PHYSICAL  EXAMINATION:  GENERAL:  58 y.o.-year-old morbidly obese patient lying in the bed with no acute distress.  EYES: Pupils equal, round, reactive to light and accommodation. No scleral icterus. Extraocular muscles intact.  HEENT: Head atraumatic, normocephalic. Oropharynx and nasopharynx clear.  NECK:  Supple, no jugular venous distention. No thyroid enlargement, no tenderness.  Tracheostomy in place. LUNGS: Normal breath sounds bilaterally, no wheezing, rales,rhonchi or crepitation. No use of accessory muscles of respiration.  CARDIOVASCULAR: S1, S2 normal. No murmurs, rubs, or gallops.  ABDOMEN: Soft, nontender, nondistended. Bowel sounds present. No organomegaly or mass.  EXTREMITIES: Bilateral some pedal edema, cyanosis, or clubbing.  Both legs and left upper extremity NEUROLOGIC: Cranial nerves II through XII are intact. Muscle strength 5/5 in all extremities. Sensation intact. Gait not checked.  PSYCHIATRIC: [name].  SKIN: No obvious rash, lesion, or ulcer.         Physical Exam LABORATORY PANEL:   CBC Recent Labs  Lab 01/01/19 1401  WBC 10.4  HGB 12.0*  HCT 35.9*  PLT 234   ------------------------------------------------------------------------------------------------------------------  Chemistries  Recent Labs  Lab 01/01/19 1401 01/02/19 0548  NA 131*  --   K 3.8  --   CL 91*  --   CO2 27  --   GLUCOSE 214*  --   BUN 22*  --   CREATININE 1.13 1.03  CALCIUM 8.7*  --   AST 33  --   ALT 28  --   ALKPHOS 79  --   BILITOT 0.8  --    ------------------------------------------------------------------------------------------------------------------  Cardiac Enzymes No results for input(s): TROPONINI in the last 168 hours. ------------------------------------------------------------------------------------------------------------------  RADIOLOGY:  Dg Elbow Complete Left  Result Date: 01/01/2019 CLINICAL DATA:  LEFT elbow pain,  swelling, bruising, redness and warmth, denies injury EXAM: LEFT ELBOW - COMPLETE 3+ VIEW COMPARISON:  None FINDINGS: Diffuse soft tissue swelling at LEFT elbow region. Osseous mineralization normal. Joint spaces preserved. No acute fracture, dislocation, or bone destruction. No elbow joint effusion. IMPRESSION: Significant soft tissue swelling at the LEFT elbow region. No acute osseous abnormalities. Electronically Signed   By: Lavonia Dana M.D.   On: 01/01/2019 14:40   US Venous Img Upper Uni Left  Result Date: 01/01/2019 CLINICAL DATA:  58 year old male with left arm pain, swelling and redness EXAM: LEFT UPPER EXTREMITY VENOUS DOPPLER ULTRASOUND TECHNIQUE: Gray-scale sonography with graded compression, as well as color Doppler and duplex ultrasound were performed to evaluate the upper extremity deep venous system from the level of the subclavian vein and including the jugular, axillary, basilic, radial, ulnar and upper cephalic vein. Spectral Doppler was utilized to evaluate flow at rest and with distal augmentation maneuvers. COMPARISON:  None. FINDINGS: Contralateral Subclavian Vein: Respiratory phasicity is normal and symmetric with the symptomatic side. No evidence of thrombus. Normal compressibility. Internal Jugular Vein: No evidence of thrombus. Normal compressibility, respiratory phasicity and response to augmentation. Subclavian Vein: No evidence of thrombus. Normal compressibility, respiratory phasicity and response to augmentation. Axillary Vein: No evidence of thrombus. Normal compressibility, respiratory phasicity and response to augmentation. Cephalic Vein: No evidence of thrombus. Normal compressibility, respiratory phasicity and response to augmentation. Basilic Vein: No evidence of thrombus. Normal compressibility, respiratory phasicity and response to augmentation. Brachial Veins: No evidence of thrombus. Normal compressibility, respiratory phasicity and response to augmentation. Radial  Veins: No evidence of thrombus. Normal compressibility, respiratory phasicity and response to augmentation. Ulnar Veins: No evidence of thrombus. Normal compressibility, respiratory phasicity and response to augmentation. Venous Reflux:  None visualized. Other Findings:  Superficial subcutaneous edema. IMPRESSION: No evidence of DVT within the left upper extremity. Electronically Signed   By: Jacqulynn Cadet M.D.   On: 01/01/2019 16:09    ASSESSMENT AND PLAN:   Active Problems:   Cellulitis  1.  Cellulitis left arm and right lower extremity.  Patient has allergy to penicillin and Keflex.  Vancomycin and Cipro ordered.  2.  Morbid obesity with sleep apnea.  Patient states that he sleeps with a ventilator at home. Monitor in CCU. 3.  Morbid obesity with a BMI of 40.69.  Weight loss needed. 4.  Type 2 diabetes mellitus put on sliding scale insulin and Lantus. 5.  Chronic diastolic congestive heart failure and edema.  Increase Lasix to twice daily dosing 6.  History of CAD and paroxysmal atrial fibrillation.  Decrease dose of amiodarone to 100 twice daily continue metoprolol and Lipitor.  Patient also anticoagulated with Xarelto 7.  Advised to cut down on alcohol.  Patient states he does not get shaky if he does not drink.  Continue thiamine 8.  Diabetic neuropathy on large dose of gabapentin    All the records are reviewed and case discussed with Care Management/Social Workerr. Management plans discussed with the patient, family and they are in agreement.  CODE STATUS: Full.  TOTAL TIME TAKING CARE OF THIS PATIENT: 35 minutes.     POSSIBLE D/C IN 1-2 DAYS, DEPENDING ON CLINICAL CONDITION.   Vaughan Basta M.D on 01/02/2019   Between 7am to 6pm - Pager - [phone]  After 6pm go to [email or web address] -  password [email or web address]  [facility]  Office  [phone]  CC: Primary care physician; Hillery AldoPatel, Sarah, MD  Note: This dictation was prepared with Dragon dictation  along with smaller phrase technology. Any transcriptional errors that result from this process are unintentional.

## 2019-01-02 NOTE — Progress Notes (Signed)
Patient reported pain again, RN gave him morphine and Advil. Pain is in the elbow.  Patient prefers to do his own trach care.  Supplies given to patient.  Two extra inner cannulas in the room.  He wears size 8.  Phillis Knack, RN
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
## 2019-01-03 ENCOUNTER — Inpatient Hospital Stay: Payer: Medicare HMO

## 2019-01-03 LAB — BASIC METABOLIC PANEL
Anion gap: 14 (ref 5–15)
BUN: 21 mg/dL — ABNORMAL HIGH (ref 6–20)
CO2: 28 mmol/L (ref 22–32)
Calcium: 9.3 mg/dL (ref 8.9–10.3)
Chloride: 92 mmol/L — ABNORMAL LOW (ref 98–111)
Creatinine, Ser: 1.02 mg/dL (ref 0.61–1.24)
GFR calc Af Amer: >60 mL/min (ref >60)
GFR calc non Af Amer: >60 mL/min (ref >60)
Glucose, Bld: 149 mg/dL — ABNORMAL HIGH (ref 70–99)
Potassium: 3.8 mmol/L (ref 3.5–5.1)
Sodium: 134 mmol/L — ABNORMAL LOW (ref 135–145)

## 2019-01-03 LAB — CREATININE, SERUM
Creatinine, Ser: 1.01 mg/dL (ref 0.61–1.24)
GFR calc Af Amer: >60 mL/min (ref >60)
GFR calc non Af Amer: >60 mL/min (ref >60)

## 2019-01-03 LAB — GLUCOSE, CAPILLARY
Glucose-Capillary: 191 mg/dL — ABNORMAL HIGH (ref 70–99)
Glucose-Capillary: 129 mg/dL — ABNORMAL HIGH (ref 70–99)
Glucose-Capillary: 119 mg/dL — ABNORMAL HIGH (ref 70–99)

## 2019-01-03 LAB — PROCALCITONIN: Procalcitonin: <0.1 ng/mL

## 2019-01-03 LAB — PHOSPHORUS: Phosphorus: 2.9 mg/dL (ref 2.5–4.6)

## 2019-01-03 LAB — MAGNESIUM: Magnesium: 1.5 mg/dL — ABNORMAL LOW (ref 1.7–2.4)

## 2019-01-03 MED ORDER — CHLORHEXIDINE GLUCONATE CLOTH 2 % EX PADS: 6.0000 | MEDICATED_PAD | Freq: Every day | CUTANEOUS | Status: DC

## 2019-01-03 MED ORDER — LORAZEPAM 2 MG/ML IJ SOLN
1.0000 mg | Freq: Once | INTRAMUSCULAR | Status: DC
  Filled 2019-01-03: qty 1

## 2019-01-03 MED ORDER — IOPAMIDOL (ISOVUE-370) INJECTION 76%
100.0000 mL | Freq: Once | INTRAVENOUS | Status: AC | PRN
  Administered 2019-01-03: 100 mL via INTRAVENOUS

## 2019-01-03 NOTE — Progress Notes (Signed)
Pt off the vent , on room air sat 98% hr 58

## 2019-01-03 NOTE — Progress Notes (Signed)
No distress.  Cognition intact.  Still with LUE pain and swelling  Vitals:   01/03/19 0600 01/03/19 0700 01/03/19 1000 01/03/19 1113  BP:    (!) 141/61  Pulse: (!) 51  70 72  Resp: 19  20   Temp:      TempSrc:      SpO2: 97% 94% 96%   Weight:      Height:        HEENT WNL Trach site clean Chest clear Cardiac: RRR, no M Abdomen: Obese, soft, NABS Extremities: Symmetric BLE edema with chronic stasis changes.  LUE edematous and erythematous.  Tender on back of upper arm.    BMP Latest Ref Rng & Units 01/03/2019 01/03/2019 01/02/2019  Glucose 70 - 99 mg/dL 149(H) - -  BUN 6 - 20 mg/dL 21(H) - -  Creatinine 0.61 - 1.24 mg/dL 1.02 1.01 1.03  Sodium 135 - 145 mmol/L 134(L) - -  Potassium 3.5 - 5.1 mmol/L 3.8 - -  Chloride 98 - 111 mmol/L 92(L) - -  CO2 22 - 32 mmol/L 28 - -  Calcium 8.9 - 10.3 mg/dL 9.3 - -   CBC Latest Ref Rng & Units 01/01/2019 01/27/2018 10/24/2017  WBC 4.0 - 10.5 K/uL 10.4 5.2 7.7  Hemoglobin 13.0 - 17.0 g/dL 12.0(L) 11.3(L) 11.0(L)  Hematocrit 39.0 - 52.0 % 35.9(L) 33.9(L) 32.6(L)  Platelets 150 - 400 K/uL 234 198 259   CXR: No recent film  IMPRESSION: OSA/OHS with hx of UAO, chronic trach and chronic nocturnal vent (settings 18/8) LUE cellulitis, R/O fasciitis LUE pain  PLAN/REC: We are housing him in ICU/SDU due to requirement for nocturnal ventilation Continue current antibiotics at least through today  Since no BCs sent, we will not have a culture to direct [place] When he meets criteria for discharge, recommend levofloxacin and complete 10-14 days CT LUE ordered today.  Per report, no evidence of abscess or fasciitis Continue current analgesics  Merton Border, MD PCCM service Mobile [phone] Pager [phone] 01/03/2019 1:32 PM

## 2019-01-03 NOTE — Progress Notes (Signed)
[facility] - [place] at [facility]   PATIENT NAME: [name]    MR#:  [id number]  DATE OF BIRTH:  [birth year]  SUBJECTIVE:  CHIEF COMPLAINT:  No chief complaint on file.  Came with cellulitis and admitted to stepdown because he has tracheostomy for sleep apnea and uses ventilator every night.  No new complaints, continue to have significant pain on left upper extremity with swelling REVIEW OF SYSTEMS:  CONSTITUTIONAL: No fever, fatigue or weakness.  EYES: No blurred or double vision.  EARS, NOSE, AND THROAT: No tinnitus or ear pain.  RESPIRATORY: No cough, shortness of breath, wheezing or hemoptysis.  CARDIOVASCULAR: No chest pain, orthopnea, edema.  GASTROINTESTINAL: No nausea, vomiting, diarrhea or abdominal pain.  GENITOURINARY: No dysuria, hematuria.  ENDOCRINE: No polyuria, nocturia,  HEMATOLOGY: No anemia, easy bruising or bleeding SKIN: No rash or lesion. MUSCULOSKELETAL: No joint pain or arthritis.   NEUROLOGIC: No tingling, numbness, weakness.  PSYCHIATRY: No anxiety or depression.   ROS  DRUG ALLERGIES:   Allergies  Allergen Reactions   Cephalexin     Other reaction(s): Unknown   Hctz  [Hydrochlorothiazide]     Other reaction(s): Other (See Comments) hyponatremia   Hydralazine    Penicillins Hives    Has patient had a PCN reaction causing immediate rash, facial/tongue/throat swelling, SOB or lightheadedness with hypotension: Yes Has patient had a PCN reaction causing severe rash involving mucus membranes or skin necrosis: Yes Has patient had a PCN reaction that required hospitalization: Unknown Has patient had a PCN reaction occurring within the last 10 years: No If all of the above answers are "NO", then may proceed with Cephalosporin use.    Reserpine    Zaroxolyn [Metolazone]     Other reaction(s): Unknown    VITALS:  Blood pressure (!) 141/61, pulse 72, temperature 97.8 F (36.6 C), temperature source Oral, resp. rate 20, height  6' 0.01" (1.829 m), weight (!) 136.8 kg, SpO2 96 %.  PHYSICAL EXAMINATION:  GENERAL:  58 y.o.-year-old morbidly obese patient lying in the bed with no acute distress.  EYES: Pupils equal, round, reactive to light and accommodation. No scleral icterus. Extraocular muscles intact.  HEENT: Head atraumatic, normocephalic. Oropharynx and nasopharynx clear.  NECK:  Supple, no jugular venous distention. No thyroid enlargement, no tenderness.  Tracheostomy in place. LUNGS: Normal breath sounds bilaterally, no wheezing, rales,rhonchi or crepitation. No use of accessory muscles of respiration.  CARDIOVASCULAR: S1, S2 normal. No murmurs, rubs, or gallops.  ABDOMEN: Soft, nontender, nondistended. Bowel sounds present. No organomegaly or mass.  EXTREMITIES: Bilateral some pedal edema, cyanosis, or clubbing.  Both legs and left upper extremity has swelling and redness.  Able to move fingers on left hand. NEUROLOGIC: Cranial nerves II through XII are intact. Muscle strength 5/5 in all extremities. Sensation intact. Gait not checked.  PSYCHIATRIC: [name].  SKIN: No obvious rash, lesion, or ulcer.         Physical Exam LABORATORY PANEL:   CBC Recent Labs  Lab 01/01/19 1401  WBC 10.4  HGB 12.0*  HCT 35.9*  PLT 234   ------------------------------------------------------------------------------------------------------------------  Chemistries  Recent Labs  Lab 01/01/19 1401  01/03/19 0457  NA 131*  --  134*  K 3.8  --  3.8  CL 91*  --  92*  CO2 27  --  28  GLUCOSE 214*  --  149*  BUN 22*  --  21*  CREATININE 1.13   < > 1.02  1.01  CALCIUM 8.7*  --  9.3  MG  --   --  1.5*  AST 33  --   --   ALT 28  --   --   ALKPHOS 79  --   --   BILITOT 0.8  --   --    < > = values in this interval not displayed.   ------------------------------------------------------------------------------------------------------------------  Cardiac Enzymes No results for  input(s): TROPONINI in the last 168 hours. ------------------------------------------------------------------------------------------------------------------  RADIOLOGY:  Ct Extrem Up Entire Arm L W/cm  Result Date: 01/03/2019 CLINICAL DATA:  LUE with edema and erythema. Tender around the L elbow. R/o cellulitis per MD ordering notes. Pt with large body habitus. Best images possible. ^167mL ISOVUE-370 IOPAMIDOL (ISOVUE-370) INJECTION 76% EXAM: CT OF THE UPPER LEFT EXTREMITY WITH CONTRAST TECHNIQUE: Multidetector CT imaging of the upper left extremity was performed according to the standard protocol following intravenous contrast administration. COMPARISON:  None. CONTRAST:  131mL ISOVUE-370 IOPAMIDOL (ISOVUE-370) INJECTION 76% FINDINGS: Bones/Joint/Cartilage No fracture or bone lesion. No areas of bone resorption to suggest osteomyelitis. Shoulder, elbow, wrist and hand joints are normally spaced and aligned. No arthropathic changes. No elbow joint effusion. Ligaments Suboptimally assessed by CT. Muscles and Tendons Muscles are unremarkable.  Tendons are grossly intact. Soft tissues There is diffuse subcutaneous soft tissue edema and/or cellulitis that extends from the lateral aspect of the shoulder, across the posterior arm extending to involve the medial and lateral aspects of the arm, extending across the elbow into the forearm, with the subcutaneous soft tissues of the forearm more diffusely affected. There is no formed fluid collection to suggest an abscess. There is no soft tissue air. Vascular structures are widely patent. IMPRESSION: 1. Diffuse edema/cellulitis extending to of the entirety of the left arm and forearm, predominantly along the posterior aspect of the arm through the elbow. There is no evidence of an abscess. There are no CT findings to suggest fasciitis. 2. No other abnormalities. No evidence of osteomyelitis. No bone or joint abnormality. Electronically Signed   By: Lajean Manes M.D.    On: 01/03/2019 11:23    ASSESSMENT AND PLAN:   Active Problems:   Cellulitis  1.  Cellulitis left arm and right lower extremity.  Patient has allergy to penicillin and Keflex.  Vancomycin and Cipro ordered.  As he has persistent swelling and complaint of pain get CT scan of upper extremity to rule out deep infections. 2.  Morbid obesity with sleep apnea.  Patient states that he sleeps with a ventilator at home. Monitor in CCU. 3.  Morbid obesity with a BMI of 40.69.  Weight loss needed. 4.  Type 2 diabetes mellitus put on sliding scale insulin and Lantus. 5.  Chronic diastolic congestive heart failure and edema.  Increase Lasix to twice daily dosing 6.  History of CAD and paroxysmal atrial fibrillation.  Decrease dose of amiodarone to 100 twice daily continue metoprolol and Lipitor.  Patient also anticoagulated with Xarelto 7.  Advised to cut down on alcohol.  Patient states he does not get shaky if he does not drink.  Continue thiamine 8.  Diabetic neuropathy on large dose of gabapentin    All the records are reviewed and case discussed with Care Management/Social Workerr. Management plans discussed with the patient, family and they are in agreement.  CODE STATUS: Full.  TOTAL TIME TAKING CARE OF THIS PATIENT: 35 minutes.    POSSIBLE D/C IN 1-2 DAYS, DEPENDING ON CLINICAL CONDITION.   Vaughan Basta M.D  on 01/03/2019   Between 7am to 6pm - Pager - [phone]  After 6pm go to [email or web address] - password [email or web address]  [facility] [place] [facility]  Office  [phone]  CC: Primary care physician; Hillery AldoPatel, Sarah, MD  Note: This dictation was prepared with Dragon dictation along with smaller phrase technology. Any transcriptional errors that result from this process are unintentional.

## 2019-01-03 NOTE — Progress Notes (Signed)
Pharmacy Antibiotic Note  [name] is a 58 y.o. male admitted on 01/01/2019 with left elbow and RLE cellulitis.  Pharmacy has been consulted for cipro and vancomycin dosing.  Pt received cipro 400 mg IV x1 at 1726 in the ED Pt ordered vancomycin 1500 mg given at 1915. Pt did not receive the vancomycin 1000 mg.   Plan: Day 3 of antibiotics.  Cipro 400 mg IV q12h   Vancomycin 1250 mg IV Q 12 hrs. Goal AUC 400-550. Expected AUC: 472.5  SCr used: 1.03 Expected Cmin: 13.4  Follow up SCr in AM to monitor renal function. Plan to draw level for Monday.   Height: 6' 0.01" (182.9 cm) Weight: (!) 301 lb 9.4 oz (136.8 kg) IBW/kg (Calculated) : 77.62  Temp (24hrs), Avg:98.2 F (36.8 C), Min:97.8 F (36.6 C), Max:98.7 F (37.1 C)  Recent Labs  Lab 01/01/19 1401 01/01/19 1703 01/02/19 0548 01/03/19 0457  WBC 10.4  --   --   --   CREATININE 1.13  --  1.03 1.01  LATICACIDVEN 2.2* 1.8  --   --     Estimated Creatinine Clearance: 117 mL/min (by C-G formula based on SCr of 1.01 mg/dL).    Allergies  Allergen Reactions  . Cephalexin     Other reaction(s): Unknown  . Hctz  [Hydrochlorothiazide]     Other reaction(s): Other (See Comments) hyponatremia  . Hydralazine   . Penicillins Hives    Has patient had a PCN reaction causing immediate rash, facial/tongue/throat swelling, SOB or lightheadedness with hypotension: Yes Has patient had a PCN reaction causing severe rash involving mucus membranes or skin necrosis: Yes Has patient had a PCN reaction that required hospitalization: Unknown Has patient had a PCN reaction occurring within the last 10 years: No If all of the above answers are "NO", then may proceed with Cephalosporin use.   Marland Kitchen Reserpine   . Zaroxolyn [Metolazone]     Other reaction(s): Unknown    Antimicrobials this admission: Cipro/vanc 7/10 >>  Dose adjustments this admission: None  Microbiology results: MRSA PCR negative.   Thank you for allowing pharmacy  to be a part of this patient's care.  Oswald Hillock, PharmD, BCPS  01/03/2019 7:38 AM

## 2019-01-04 ENCOUNTER — Encounter: Payer: Self-pay | Admitting: *Deleted

## 2019-01-04 LAB — CBC
HCT: 34.7 % — ABNORMAL LOW (ref 39.0–52.0)
Hemoglobin: 11.2 g/dL — ABNORMAL LOW (ref 13.0–17.0)
MCH: 28.6 pg (ref 26.0–34.0)
MCHC: 32.3 g/dL (ref 30.0–36.0)
MCV: 88.7 fL (ref 80.0–100.0)
Platelets: 281 10*3/uL (ref 150–400)
RBC: 3.91 MIL/uL — ABNORMAL LOW (ref 4.22–5.81)
RDW: 14 % (ref 11.5–15.5)
WBC: 7.3 10*3/uL (ref 4.0–10.5)
nRBC: 0 % (ref 0.0–0.2)

## 2019-01-04 LAB — CREATININE, SERUM
Creatinine, Ser: 1.15 mg/dL (ref 0.61–1.24)
GFR calc Af Amer: >60 mL/min (ref >60)
GFR calc non Af Amer: >60 mL/min (ref >60)

## 2019-01-04 LAB — GLUCOSE, CAPILLARY
Glucose-Capillary: 187 mg/dL — ABNORMAL HIGH (ref 70–99)
Glucose-Capillary: 100 mg/dL — ABNORMAL HIGH (ref 70–99)
Glucose-Capillary: 199 mg/dL — ABNORMAL HIGH (ref 70–99)
Glucose-Capillary: 138 mg/dL — ABNORMAL HIGH (ref 70–99)
Glucose-Capillary: 215 mg/dL — ABNORMAL HIGH (ref 70–99)

## 2019-01-04 LAB — PROCALCITONIN: Procalcitonin: <0.1 ng/mL

## 2019-01-04 MED ORDER — DIPHENHYDRAMINE HCL 25 MG PO CAPS
25.0000 mg | ORAL_CAPSULE | Freq: Four times a day (QID) | ORAL | Status: DC | PRN
  Administered 2019-01-04 – 2019-01-08 (×7): 25 mg via ORAL
  Filled 2019-01-04 (×8): qty 1

## 2019-01-04 MED ORDER — MAGNESIUM SULFATE 2 GM/50ML IV SOLN
2.0000 g | Freq: Once | INTRAVENOUS | Status: AC
  Administered 2019-01-04: 2 g via INTRAVENOUS
  Filled 2019-01-04: qty 50

## 2019-01-04 MED ORDER — DEXAMETHASONE SODIUM PHOSPHATE 4 MG/ML IJ SOLN
4.0000 mg | Freq: Three times a day (TID) | INTRAMUSCULAR | Status: DC
  Administered 2019-01-04 – 2019-01-07 (×9): 4 mg via INTRAVENOUS
  Filled 2019-01-04 (×9): qty 1

## 2019-01-04 MED ORDER — DIPHENHYDRAMINE HCL 25 MG PO CAPS: 25.0000 mg | ORAL_CAPSULE | Freq: Four times a day (QID) | ORAL | Status: DC | PRN

## 2019-01-04 MED ORDER — MORPHINE SULFATE (PF) 2 MG/ML IV SOLN
2.0000 mg | INTRAVENOUS | Status: DC | PRN
  Administered 2019-01-04 – 2019-01-06 (×7): 2 mg via INTRAVENOUS
  Administered 2019-01-06: 4 mg via INTRAVENOUS
  Administered 2019-01-06 (×2): 2 mg via INTRAVENOUS
  Administered 2019-01-07 (×4): 4 mg via INTRAVENOUS
  Administered 2019-01-07: 2 mg via INTRAVENOUS
  Administered 2019-01-07 – 2019-01-08 (×4): 4 mg via INTRAVENOUS
  Filled 2019-01-04: qty 2
  Filled 2019-01-04: qty 1
  Filled 2019-01-04: qty 2
  Filled 2019-01-04 (×2): qty 1
  Filled 2019-01-04 (×2): qty 2
  Filled 2019-01-04: qty 1
  Filled 2019-01-04: qty 2
  Filled 2019-01-04: qty 1
  Filled 2019-01-04 (×2): qty 2
  Filled 2019-01-04 (×4): qty 1
  Filled 2019-01-04: qty 2
  Filled 2019-01-04: qty 1
  Filled 2019-01-04: qty 2

## 2019-01-04 MED ORDER — CHLORHEXIDINE GLUCONATE CLOTH 2 % EX PADS
6.0000 | MEDICATED_PAD | Freq: Every day | CUTANEOUS | Status: DC
  Administered 2019-01-05 – 2019-01-08 (×4): 6 via TOPICAL

## 2019-01-04 NOTE — Progress Notes (Signed)
Pharmacy Antibiotic Note  [name] is a 58 y.o. male admitted on 01/01/2019 with left elbow and RLE cellulitis.  Pharmacy has been consulted for cipro and vancomycin dosing. This is day #4 of antibiotics. His SCr is slightly higher than yesterday  Plan:  1) continue Cipro 400 mg IV q12h   2) continue vancomycin 1250 mg IV Q 12 hrs. Goal AUC 400-550. Expected AUC: 523  SCr used: 1.15 BMI 40.9: Vd 0.5 L/kg Expected Cmin: 15.5 T1/2: 9.9 h Follow up SCr in AM to monitor renal function: if SCr continues to increase I will order a random level prior to tomorrow morning's dose  Height: 6' 0.01" (182.9 cm) Weight: (!) 301 lb 9.4 oz (136.8 kg) IBW/kg (Calculated) : 77.62  Temp (24hrs), Avg:98.4 F (36.9 C), Min:97.9 F (36.6 C), Max:98.9 F (37.2 C)  Recent Labs  Lab 01/01/19 1401 01/01/19 1703 01/02/19 0548 01/03/19 0457 01/04/19 0436  WBC 10.4  --   --   --  7.3  CREATININE 1.13  --  1.03 1.02  1.01 1.15  LATICACIDVEN 2.2* 1.8  --   --   --     Estimated Creatinine Clearance: 102.8 mL/min (by C-G formula based on SCr of 1.15 mg/dL).     Antimicrobials this admission: Cipro/vanc 7/10 >>  Microbiology results: 7/10 MRSA PCR negative.   Thank you for allowing pharmacy to be a part of this patient's care.  Dallie Piles, PharmD 01/04/2019 11:52 AM

## 2019-01-04 NOTE — Evaluation (Addendum)
Physical Therapy Evaluation Patient Details Name: [name] MRN: [id number] DOB: Jun 26, 1960 Today's Date: 01/04/2019   History of Present Illness  presented to ER from home secondary to L UE pain; admitted for management of bilat LE, L UE cellulitis.  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  Clinical Impression  Upon evaluation, patient alert and oriented; follows commands and demonstrates good effort with all mobility tasks.  L UE, bilat distal LEs generally edematous and reddened due to cellulitis, but strength and ROM grossly symmetrical and WFL for basic transfers and gait tasks.  Able to complete sit/stand, basic transfers and gait (50-100') with RW, cga/close sup.  Initial trial of gait (50') without assist device, cga/min assist, demonstrating shuffling gait performance, inconsistent step height/length/foot placement, fair trunk rotation and arm swing; increased postural sway with mild L knee buckling reported.  Additional trial with RW (100') revealing improved stepping pattern and overall foot placement; improved gait mechanics, overall stability; improved energy conservation, LE stability, decreased fall risk.  Do recommend continued use of RW for all mobility tasks at this time; patinet voiced agreement/understanding. Would benefit from skilled PT to address above deficits and promote optimal return to PLOF; recommend transition to home with outpatient PT follow up as appropriate.    Follow Up Recommendations Outpatient PT    Equipment Recommendations  (bariatric RW)    Recommendations for Other Services       Precautions / Restrictions Precautions Precautions: Fall Precaution Comments: trach      Mobility  Bed Mobility               General bed mobility comments: seated in recliner beginning/end of treatment session  Transfers Overall transfer level: Needs assistance Equipment used:  Rolling walker (2 wheeled) Transfers: Sit to/from Stand Sit to Stand: Min guard         General transfer comment: broad BOS, heavy use of UEs to assist with lift off  Ambulation/Gait Ambulation/Gait assistance: Min guard;Supervision Gait Distance (Feet): 50 Feet Assistive device: None       General Gait Details: shuffling gait performance, inconsistent step height/lenght/foot placement, fair trunk rotation and arm swing; increased postural sway with mild L knee buckling reported  Stairs            Wheelchair Mobility    Modified Rankin (Stroke Patients Only)       Balance Overall balance assessment: Needs assistance Sitting-balance support: No upper extremity supported;Feet supported Sitting balance-Leahy Scale: Good     Standing balance support: No upper extremity supported Standing balance-Leahy Scale: Fair                               Pertinent Vitals/Pain Pain Assessment: Faces Faces Pain Scale: Hurts little more Pain Location: L Elbow Pain Descriptors / Indicators: Aching;Guarding;Grimacing Pain Intervention(s): Limited activity within patient's tolerance    Home Living Family/patient expects to be discharged to:: Private residence Living Arrangements: Spouse/significant other Available Help at Discharge: Family Type of Home: House Home Access: Ramped entrance     Home Layout: One level Home Equipment: Gilmer MorCane - single point      Prior Function Level of Independence: Independent with assistive device(s)         Comments: Mod indep with intermittent use of SPC for ADLs, household and very limited community mobilization; 3-4 falls within previous year (knee buckling, rug slipped)  Hand Dominance   Dominant Hand: Right    Extremity/Trunk Assessment   Upper Extremity Assessment Upper Extremity Assessment: Overall WFL for tasks assessed(L UE grossly 4-/5, generally edematous and reddened due to cellulitis)    Lower  Extremity Assessment Lower Extremity Assessment: Overall WFL for tasks assessed(grossly 4/5 throughout)       Communication   Communication: Tracheostomy(finger occlusion for breath support)  Cognition Arousal/Alertness: Awake/alert Behavior During Therapy: WFL for tasks assessed/performed Overall Cognitive Status: Within Functional Limits for tasks assessed                                        General Comments      Exercises Other Exercises Other Exercises: 100' with RW, cga/close sup-improved stepping pattern and overall foot placement; improved gait mechanics, overall stability; improved energy conservation, LE stability, decreased fall risk.  Do recommend continued use of RW for all mobility tasks at this time; patinet voiced agreement/understanding.   Assessment/Plan    PT Assessment Patient needs continued PT services  PT Problem List Decreased strength;Decreased activity tolerance;Decreased balance;Cardiopulmonary status limiting activity;Decreased mobility;Obesity       PT Treatment Interventions DME instruction;Gait training;Functional mobility training;Therapeutic activities;Therapeutic exercise;Balance training    PT Goals (Current goals can be found in the Care Plan section)  Acute Rehab PT Goals Patient Stated Goal: to return home when ready, to make L UE feel better PT Goal Formulation: With patient Time For Goal Achievement: 01/18/19 Potential to Achieve Goals: Good    Frequency Min 2X/week   Barriers to discharge        Co-evaluation PT/OT/SLP Co-Evaluation/Treatment: Yes Reason for Co-Treatment: Complexity of the patient's impairments (multi-system involvement);For patient/therapist safety PT goals addressed during session: Mobility/safety with mobility OT goals addressed during session: ADL's and self-care;Proper use of Adaptive equipment and DME       AM-PAC PT "6 Clicks" Mobility  Outcome Measure Help needed turning from your  back to your side while in a flat bed without using bedrails?: A Little Help needed moving from lying on your back to sitting on the side of a flat bed without using bedrails?: A Little Help needed moving to and from a bed to a chair (including a wheelchair)?: A Little Help needed standing up from a chair using your arms (e.g., wheelchair or bedside chair)?: A Little Help needed to walk in hospital room?: A Little Help needed climbing 3-5 steps with a railing? : A Little 6 Click Score: 18    End of Session Equipment Utilized During Treatment: Gait belt Activity Tolerance: Patient tolerated treatment well Patient left: in chair;with call bell/phone within reach Nurse Communication: Mobility status PT Visit Diagnosis: Muscle weakness (generalized) (M62.81);Difficulty in walking, not elsewhere classified (R26.2)    Time: 8144-8185 PT Time Calculation (min) (ACUTE ONLY): 18 min   Charges:   PT Evaluation $PT Eval Moderate Complexity: 1 Mod PT Treatments $Gait Training: 8-22 mins       Netha Dafoe H. Owens Shark, PT, DPT, NCS 01/04/19, 11:24 AM [phone]

## 2019-01-04 NOTE — Evaluation (Signed)
Occupational Therapy Evaluation Patient Details Name: [name] MRN: [id number] DOB: [birth year] Today's Date: 01/04/2019    History of Present Illness presented to ER from home secondary to L UE pain; admitted for management of bilat LE, L UE cellulitis.  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   Clinical Impression   Pt seen for OT co-evaluation/tx with PT this date. Pt was independent in all ADLs and mobility, but having increased difficulty. Pt lives in a 1 story home with his partner and ramped entrance. Pt currently requires PRN Min A for LB ADL and CGA for functional transfers and mobility using RW due to poor activity tolerance, pain/swelling in LUE, and poor balance. Pt also endorses significant fear of falling in the tub. Pt educated in energy conservation conservation strategies including activity pacing, home/routines modifications, work simplification, AE/DME, prioritizing of meaningful occupations, and falls prevention. Pt verbalized understanding but would benefit from additional skilled OT services to maximize recall and carryover of learned techniques and facilitate implementation of learned techniques into daily routines. Upon discharge, recommend OP OT services.  Pt would also benefit from toilet riser to increase the height of his toilet and improve safety/independence with toilet transfers.    Follow Up Recommendations  Outpatient OT    Equipment Recommendations  Toilet riser    Recommendations for Other Services       Precautions / Restrictions Precautions Precautions: Fall Precaution Comments: trach Restrictions Weight Bearing Restrictions: No      Mobility Bed Mobility               General bed mobility comments: seated in recliner beginning/end of treatment session  Transfers Overall transfer level: Needs assistance Equipment used: Rolling walker (2 wheeled) Transfers:  Sit to/from Stand Sit to Stand: Min guard         General transfer comment: broad BOS, heavy use of UEs to assist with lift off    Balance Overall balance assessment: Needs assistance Sitting-balance support: No upper extremity supported;Feet supported Sitting balance-Leahy Scale: Good     Standing balance support: No upper extremity supported Standing balance-Leahy Scale: Fair                             ADL either performed or assessed with clinical judgement   ADL Overall ADL's : Needs assistance/impaired                                       General ADL Comments: PRN Min A for LB ADL, difficulty/increased effort to perform, CGA for toilet transfers to comfort height toilet using RW     Vision Baseline Vision/History: Wears glasses Wears Glasses: At all times(or contacts) Patient Visual Report: No change from baseline Vision Assessment?: No apparent visual deficits     Perception     Praxis      Pertinent Vitals/Pain Pain Assessment: 0-10 Pain Score: 8  Faces Pain Scale: Hurts little more Pain Location: L Elbow Pain Descriptors / Indicators: Sharp Pain Intervention(s): Limited activity within patient's tolerance;Monitored during session;RN gave pain meds during session;Patient requesting pain meds-RN notified;Repositioned     Hand Dominance Right   Extremity/Trunk Assessment Upper Extremity Assessment Upper Extremity Assessment: Overall WFL for tasks assessed;LUE deficits/detail LUE Deficits / Details: L UE grossly 4-/5, generally edematous and reddened due to  cellulitis, elbow painful LUE: Unable to fully assess due to pain   Lower Extremity Assessment Lower Extremity Assessment: Overall WFL for tasks assessed       Communication Communication Communication: Tracheostomy(finger occlusion for breath support)   Cognition Arousal/Alertness: Awake/alert Behavior During Therapy: WFL for tasks assessed/performed Overall  Cognitive Status: Within Functional Limits for tasks assessed                                     General Comments  LUE edematous and red, redness and skin breakdown noted on bilat shins - RN and pt aware    Exercises Other Exercises Other Exercises: 100' with RW, cga/close sup-improved stepping pattern and overall foot placement; improved gait mechanics, overall stability; improved energy conservation, LE stability, decreased fall risk.  Do recommend continued use of RW for all mobility tasks at this time; patinet voiced agreement/understanding. Other Exercises: Pt instructed in AE/DME, energy conservation strategies, falls prevention, and home/routines modifications to maximize safety/indep with toilet transfers, tub transfers, and ADL Other Exercises: Pt instructed in retrograde massage for L hand and positioning to elevate LUE to minimize edema   Shoulder Instructions      Home Living Family/patient expects to be discharged to:: Private residence Living Arrangements: Spouse/significant other Available Help at Discharge: Family Type of Home: House Home Access: Ramped entrance     Home Layout: One level     Bathroom Shower/Tub: Chief Strategy OfficerTub/shower unit   Bathroom Toilet: Standard     Home Equipment: Cane - single point          Prior Functioning/Environment Level of Independence: Independent with assistive device(s)        Comments: Mod indep with intermittent use of SPC for ADLs, household and very limited community mobilization; 3-4 falls within previous year (knee buckling, rug slipped)        OT Problem List: Decreased strength;Pain;Impaired sensation;Increased edema;Decreased range of motion;Decreased activity tolerance;Impaired balance (sitting and/or standing);Obesity;Impaired UE functional use      OT Treatment/Interventions: Self-care/ADL training;Therapeutic exercise;Therapeutic activities;Energy conservation;Patient/family education;DME and/or AE  instruction;Balance training    OT Goals(Current goals can be found in the care plan section) Acute Rehab OT Goals Patient Stated Goal: to return home when ready, to make L UE feel better OT Goal Formulation: With patient Time For Goal Achievement: 01/18/19 Potential to Achieve Goals: Good ADL Goals Pt Will Perform Lower Body Dressing: with modified independence;sit to/from stand(AE as needed, LRAD for amb) Pt Will Transfer to Toilet: with modified independence;ambulating(comfort height toilet or DME to increase height, LRAD for amb) Additional ADL Goal #1: Pt will verbalize plan to implement at least 2 learned energy conservation strategies to maximize safety/indep with ADL and minimize falls risk.  OT Frequency: Min 1X/week   Barriers to D/C:            Co-evaluation PT/OT/SLP Co-Evaluation/Treatment: Yes Reason for Co-Treatment: Complexity of the patient's impairments (multi-system involvement);For patient/therapist safety PT goals addressed during session: Mobility/safety with mobility OT goals addressed during session: ADL's and self-care;Proper use of Adaptive equipment and DME      AM-PAC OT "6 Clicks" Daily Activity     Outcome Measure Help from another person eating meals?: None Help from another person taking care of personal grooming?: None Help from another person toileting, which includes using toliet, bedpan, or urinal?: A Little Help from another person bathing (including washing, rinsing, drying)?: A Little Help from another person to  put on and taking off regular upper body clothing?: None Help from another person to put on and taking off regular lower body clothing?: A Little 6 Click Score: 21   End of Session Equipment Utilized During Treatment: Gait belt;Rolling walker Nurse Communication: (pt requesting Geo mat cushion)  Activity Tolerance: Patient tolerated treatment well Patient left: in chair;with call bell/phone within reach  OT Visit Diagnosis: Other  abnormalities of gait and mobility (R26.89);History of falling (Z91.81);Muscle weakness (generalized) (M62.81);Pain Pain - Right/Left: Left Pain - part of body: Arm                Time: [id number] OT Time Calculation (min): 62 min Charges:  OT General Charges $OT Visit: 1 Visit OT Evaluation $OT Eval Moderate Complexity: 1 Mod OT Treatments $Self Care/Home Management : 38-52 mins  Richrd PrimeJamie Stiller, MPH, MS, OTR/L ascom [phone] 01/04/19, 12:00 PM

## 2019-01-04 NOTE — Progress Notes (Signed)
CC Follow up sepsis  HPI LUE swelling and pain Chronic vent at night S/p trach   Vitals:   01/04/19 0600 01/04/19 0700 01/04/19 0800 01/04/19 1000  BP: 106/66     Pulse: (!) 49 (!) 52 (!) 51 66  Resp: 16 16 17 20   Temp:      TempSrc:      SpO2: 90% 93% 97% 97%  Weight:      Height:         Review of Systems:  Gen:  Denies  fever, sweats, chills weigh loss  HEENT: Denies blurred vision, double vision, ear pain, eye pain, hearing loss, nose bleeds, sore throat Cardiac:  No dizziness, chest pain or heaviness, chest tightness,edema, No JVD Resp:   No cough, -sputum production, -shortness of breath,-wheezing, -hemoptysis,  Gi: Denies swallowing difficulty, stomach pain, nausea or vomiting, diarrhea, constipation, bowel incontinence Gu:  Denies bladder incontinence, burning urine Ext:   +Joint pain,+swelling left arm Skin: Denies  skin rash, easy bruising or bleeding or hives Endoc:  Denies polyuria, polydipsia , polyphagia or weight change Psych:   Denies depression, insomnia or hallucinations  Other:  All other systems negative  Physical Examination:   GENERAL:NAD, no fevers, chills, no weakness no fatigue HEAD: Normocephalic, atraumatic.  EYES: PERLA, EOMI No scleral icterus.  EAR, NOSE, THROAT: s/p trach, site clean NECK: Supple. No thyromegaly.  No JVD.  PULMONARY: CTA B/L no wheezing, rhonchi, crackles CARDIOVASCULAR: S1 and S2. Regular rate and rhythm. No murmurs GASTROINTESTINAL: Soft, nontender, nondistended. Positive bowel sounds.  MUSCULOSKELETAL: No swelling, clubbing, or edema.  NEUROLOGIC: No gross focal neurological deficits. 5/5 strength all extremities SKIN: No ulceration, lesions, rashes, or cyanosis.  PSYCHIATRIC: Insight, judgment intact. -depression -anxiety ALL OTHER ROS ARE NEGATIVE     BMP Latest Ref Rng & Units 01/04/2019 01/03/2019 01/03/2019  Glucose 70 - 99 mg/dL - 149(H) -  BUN 6 - 20 mg/dL - 21(H) -  Creatinine 0.61 - 1.24 mg/dL 1.15 1.02  1.01  Sodium 135 - 145 mmol/L - 134(L) -  Potassium 3.5 - 5.1 mmol/L - 3.8 -  Chloride 98 - 111 mmol/L - 92(L) -  CO2 22 - 32 mmol/L - 28 -  Calcium 8.9 - 10.3 mg/dL - 9.3 -   CBC Latest Ref Rng & Units 01/04/2019 01/01/2019 01/27/2018  WBC 4.0 - 10.5 K/uL 7.3 10.4 5.2  Hemoglobin 13.0 - 17.0 g/dL 11.2(L) 12.0(L) 11.3(L)  Hematocrit 39.0 - 52.0 % 34.7(L) 35.9(L) 33.9(L)  Platelets 150 - 400 K/uL 281 234 198   CXR: No recent film  IMPRESSION: Severe sepsis with L Ext cellulitis Patient still with swelliing -continue IV abx -start IV decadron benadryl for itching  OSA/OHS Chronic Trach and Vent at night   PLAN/REC: We are housing him in ICU/SDU due to requirement for nocturnal ventilation Continue current antibiotics at least through today Started Decadron for swelling Since no BCs sent, we will not have a culture to direct [place] When he meets criteria for discharge, recommend levofloxacin and complete 10-14 days CT LUE -Per report, no evidence of abscess or fasciitis Continue current analgesics   Maretta Bees Patricia Pesa, M.D.  [facility]  Medical Director [place] Director [facility] Cardio-Pulmonary Department

## 2019-01-04 NOTE — Progress Notes (Signed)
[employer] - [place] [facility]   PATIENT NAME: [name] PikesJim [name]    MR#:  [id number]  DATE OF BIRTH:  Feb 09, 1961  SUBJECTIVE:  CHIEF COMPLAINT:  No chief complaint on file.  Came with cellulitis and admitted to stepdown because he has tracheostomy for sleep apnea and uses ventilator every night.  No new complaints, continue to have significant pain on left upper extremity with swelling.  REVIEW OF SYSTEMS:  CONSTITUTIONAL: No fever, fatigue or weakness.  EYES: No blurred or double vision.  EARS, NOSE, AND THROAT: No tinnitus or ear pain.  RESPIRATORY: No cough, shortness of breath, wheezing or hemoptysis.  CARDIOVASCULAR: No chest pain, orthopnea, edema.  GASTROINTESTINAL: No nausea, vomiting, diarrhea or abdominal pain.  GENITOURINARY: No dysuria, hematuria.  ENDOCRINE: No polyuria, nocturia,  HEMATOLOGY: No anemia, easy bruising or bleeding SKIN: No rash or lesion. MUSCULOSKELETAL: No joint pain or arthritis.   NEUROLOGIC: No tingling, numbness, weakness.  PSYCHIATRY: No anxiety or depression.   ROS  DRUG ALLERGIES:   Allergies  Allergen Reactions  . Cephalexin     Other reaction(s): Unknown  . Hctz  [Hydrochlorothiazide]     Other reaction(s): Other (See Comments) hyponatremia  . Hydralazine   . Penicillins Hives    Has patient had a PCN reaction causing immediate rash, facial/tongue/throat swelling, SOB or lightheadedness with hypotension: Yes Has patient had a PCN reaction causing severe rash involving mucus membranes or skin necrosis: Yes Has patient had a PCN reaction that required hospitalization: Unknown Has patient had a PCN reaction occurring within the last 10 years: No If all of the above answers are "NO", then may proceed with Cephalosporin use.   Marland Kitchen. Reserpine   . Zaroxolyn [Metolazone]     Other reaction(s): Unknown    VITALS:  Blood pressure 106/66, pulse 66, temperature 97.9 F (36.6 C), temperature source Oral, resp. rate 20, height  6' 0.01" (1.829 m), weight (!) 136.8 kg, SpO2 97 %.  PHYSICAL EXAMINATION:  GENERAL:  58 y.o.-year-old morbidly obese patient lying in the bed with no acute distress.  EYES: Pupils equal, round, reactive to light and accommodation. No scleral icterus. Extraocular muscles intact.  HEENT: Head atraumatic, normocephalic. Oropharynx and nasopharynx clear.  NECK:  Supple, no jugular venous distention. No thyroid enlargement, no tenderness.  Tracheostomy in place. LUNGS: Normal breath sounds bilaterally, no wheezing, rales,rhonchi or crepitation. No use of accessory muscles of respiration.  CARDIOVASCULAR: S1, S2 normal. No murmurs, rubs, or gallops.  ABDOMEN: Soft, nontender, nondistended. Bowel sounds present. No organomegaly or mass.  EXTREMITIES: Bilateral some pedal edema, cyanosis, or clubbing.  Both legs and left upper extremity has swelling and redness.  Able to move fingers on left hand. NEUROLOGIC: Cranial nerves II through XII are intact. Muscle strength 5/5 in all extremities. Sensation intact. Gait not checked.  PSYCHIATRIC: [name].  SKIN: No obvious rash, lesion, or ulcer.         Physical Exam LABORATORY PANEL:   CBC Recent Labs  Lab 01/04/19 0436  WBC 7.3  HGB 11.2*  HCT 34.7*  PLT 281   ------------------------------------------------------------------------------------------------------------------  Chemistries  Recent Labs  Lab 01/01/19 1401  01/03/19 0457 01/04/19 0436  NA 131*  --  134*  --   K 3.8  --  3.8  --   CL 91*  --  92*  --   CO2 27  --  28  --   GLUCOSE 214*  --  149*  --  BUN 22*  --  21*  --   CREATININE 1.13   < > 1.02  1.01 1.15  CALCIUM 8.7*  --  9.3  --   MG  --   --  1.5*  --   AST 33  --   --   --   ALT 28  --   --   --   ALKPHOS 79  --   --   --   BILITOT 0.8  --   --   --    < > = values in this interval not displayed.    ------------------------------------------------------------------------------------------------------------------  Cardiac Enzymes No results for input(s): TROPONINI in the last 168 hours. ------------------------------------------------------------------------------------------------------------------  RADIOLOGY:  Ct Extrem Up Entire Arm L W/cm  Result Date: 01/03/2019 CLINICAL DATA:  LUE with edema and erythema. Tender around the L elbow. R/o cellulitis per MD ordering notes. Pt with large body habitus. Best images possible. ^155mL ISOVUE-370 IOPAMIDOL (ISOVUE-370) INJECTION 76% EXAM: CT OF THE UPPER LEFT EXTREMITY WITH CONTRAST TECHNIQUE: Multidetector CT imaging of the upper left extremity was performed according to the standard protocol following intravenous contrast administration. COMPARISON:  None. CONTRAST:  121mL ISOVUE-370 IOPAMIDOL (ISOVUE-370) INJECTION 76% FINDINGS: Bones/Joint/Cartilage No fracture or bone lesion. No areas of bone resorption to suggest osteomyelitis. Shoulder, elbow, wrist and hand joints are normally spaced and aligned. No arthropathic changes. No elbow joint effusion. Ligaments Suboptimally assessed by CT. Muscles and Tendons Muscles are unremarkable.  Tendons are grossly intact. Soft tissues There is diffuse subcutaneous soft tissue edema and/or cellulitis that extends from the lateral aspect of the shoulder, across the posterior arm extending to involve the medial and lateral aspects of the arm, extending across the elbow into the forearm, with the subcutaneous soft tissues of the forearm more diffusely affected. There is no formed fluid collection to suggest an abscess. There is no soft tissue air. Vascular structures are widely patent. IMPRESSION: 1. Diffuse edema/cellulitis extending to of the entirety of the left arm and forearm, predominantly along the posterior aspect of the arm through the elbow. There is no evidence of an abscess. There are no CT findings to  suggest fasciitis. 2. No other abnormalities. No evidence of osteomyelitis. No bone or joint abnormality. Electronically Signed   By: Lajean Manes M.D.   On: 01/03/2019 11:23    ASSESSMENT AND PLAN:   Active Problems:   Cellulitis  1.  Cellulitis left arm and right lower extremity.  Patient has allergy to penicillin and Keflex.  Vancomycin and Cipro ordered.  As he has persistent swelling and complaint of pain get CT scan of upper extremity to rule out deep infections. CT scan was negative for any drainable abscess or fasciitis. 2.  Morbid obesity with sleep apnea.  Patient states that he sleeps with a ventilator at home. Monitor in CCU. 3.  Morbid obesity with a BMI of 40.69.  Weight loss needed. 4.  Type 2 diabetes mellitus put on sliding scale insulin and Lantus. 5.  Chronic diastolic congestive heart failure and edema.  Increase Lasix to twice daily dosing 6.  History of CAD and paroxysmal atrial fibrillation.  Decrease dose of amiodarone to 100 twice daily continue metoprolol and Lipitor.  Patient also anticoagulated with Xarelto 7.  Advised to cut down on alcohol.  Patient states he does not get shaky if he does not drink.  Continue thiamine 8.  Diabetic neuropathy on large dose of gabapentin   All the records are reviewed and case discussed with  Care Management/Social Workerr. Management plans discussed with the patient, family and they are in agreement.  CODE STATUS: Full.  TOTAL TIME TAKING CARE OF THIS PATIENT: 35 minutes.    POSSIBLE D/C IN 1-2 DAYS, DEPENDING ON CLINICAL CONDITION.   Altamese DillingVaibhavkumar Graclynn Vanantwerp M.D on 01/04/2019   Between 7am to 6pm - Pager - [phone]  After 6pm go to [email or web address] - password [email or web address]  [facility]  Office  [phone]  CC: Primary care physician; Hillery AldoPatel, Sarah, MD  Note: This dictation was prepared with Dragon dictation along with smaller phrase technology. Any transcriptional errors that result from this process  are unintentional.

## 2019-01-05 LAB — BASIC METABOLIC PANEL
Anion gap: 14 (ref 5–15)
BUN: 26 mg/dL — ABNORMAL HIGH (ref 6–20)
CO2: 23 mmol/L (ref 22–32)
Calcium: 9.3 mg/dL (ref 8.9–10.3)
Chloride: 96 mmol/L — ABNORMAL LOW (ref 98–111)
Creatinine, Ser: 1.02 mg/dL (ref 0.61–1.24)
GFR calc Af Amer: >60 mL/min (ref >60)
GFR calc non Af Amer: >60 mL/min (ref >60)
Glucose, Bld: 256 mg/dL — ABNORMAL HIGH (ref 70–99)
Potassium: 4.5 mmol/L (ref 3.5–5.1)
Sodium: 133 mmol/L — ABNORMAL LOW (ref 135–145)

## 2019-01-05 LAB — VANCOMYCIN, PEAK: Vancomycin Pk: 44 ug/mL — ABNORMAL HIGH (ref 30–40)

## 2019-01-05 LAB — VANCOMYCIN, TROUGH: Vancomycin Tr: 28 ug/mL (ref 15–20)

## 2019-01-05 LAB — GLUCOSE, CAPILLARY
Glucose-Capillary: 281 mg/dL — ABNORMAL HIGH (ref 70–99)
Glucose-Capillary: 293 mg/dL — ABNORMAL HIGH (ref 70–99)
Glucose-Capillary: 318 mg/dL — ABNORMAL HIGH (ref 70–99)
Glucose-Capillary: 246 mg/dL — ABNORMAL HIGH (ref 70–99)

## 2019-01-05 LAB — CBC WITH DIFFERENTIAL/PLATELET
Abs Immature Granulocytes: 0.09 10*3/uL — ABNORMAL HIGH (ref 0.00–0.07)
Basophils Absolute: 0 10*3/uL (ref 0.0–0.1)
Basophils Relative: 0 %
Eosinophils Absolute: 0 10*3/uL (ref 0.0–0.5)
Eosinophils Relative: 0 %
HCT: 33.8 % — ABNORMAL LOW (ref 39.0–52.0)
Hemoglobin: 11.2 g/dL — ABNORMAL LOW (ref 13.0–17.0)
Immature Granulocytes: 1 %
Lymphocytes Relative: 8 %
Lymphs Abs: 0.5 10*3/uL — ABNORMAL LOW (ref 0.7–4.0)
MCH: 29.1 pg (ref 26.0–34.0)
MCHC: 33.1 g/dL (ref 30.0–36.0)
MCV: 87.8 fL (ref 80.0–100.0)
Monocytes Absolute: 0.2 10*3/uL (ref 0.1–1.0)
Monocytes Relative: 3 %
Neutro Abs: 6 10*3/uL (ref 1.7–7.7)
Neutrophils Relative %: 88 %
Platelets: 278 10*3/uL (ref 150–400)
RBC: 3.85 MIL/uL — ABNORMAL LOW (ref 4.22–5.81)
RDW: 13.9 % (ref 11.5–15.5)
WBC: 6.8 10*3/uL (ref 4.0–10.5)
nRBC: 0 % (ref 0.0–0.2)

## 2019-01-05 LAB — MAGNESIUM: Magnesium: 1.8 mg/dL (ref 1.7–2.4)

## 2019-01-05 LAB — PROCALCITONIN: Procalcitonin: <0.1 ng/mL

## 2019-01-05 MED ORDER — INSULIN ASPART 100 UNIT/ML ~~LOC~~ SOLN
3.0000 [IU] | Freq: Three times a day (TID) | SUBCUTANEOUS | Status: DC
  Administered 2019-01-05 – 2019-01-06 (×3): 3 [IU] via SUBCUTANEOUS
  Filled 2019-01-05 (×2): qty 1

## 2019-01-05 MED ORDER — VANCOMYCIN HCL 10 G IV SOLR
1500.0000 mg | INTRAVENOUS | Status: DC
  Filled 2019-01-05: qty 1500

## 2019-01-05 MED ORDER — VANCOMYCIN HCL 1.5 G IV SOLR
1500.0000 mg | INTRAVENOUS | Status: DC
  Filled 2019-01-05: qty 1500

## 2019-01-05 MED ORDER — MAGNESIUM SULFATE 2 GM/50ML IV SOLN
2.0000 g | Freq: Once | INTRAVENOUS | Status: AC
  Administered 2019-01-05: 2 g via INTRAVENOUS
  Filled 2019-01-05: qty 50

## 2019-01-05 MED ORDER — INSULIN DETEMIR 100 UNIT/ML ~~LOC~~ SOLN
24.0000 [IU] | Freq: Two times a day (BID) | SUBCUTANEOUS | Status: DC
  Administered 2019-01-05 – 2019-01-06 (×2): 24 [IU] via SUBCUTANEOUS
  Filled 2019-01-05 (×3): qty 0.24

## 2019-01-05 MED ORDER — POLYETHYLENE GLYCOL 3350 17 G PO PACK
17.0000 g | PACK | Freq: Once | ORAL | Status: DC
  Filled 2019-01-05: qty 1

## 2019-01-05 MED ORDER — SENNOSIDES-DOCUSATE SODIUM 8.6-50 MG PO TABS
2.0000 | ORAL_TABLET | Freq: Once | ORAL | Status: DC
  Filled 2019-01-05: qty 2

## 2019-01-05 MED ORDER — IPRATROPIUM-ALBUTEROL 0.5-2.5 (3) MG/3ML IN SOLN
RESPIRATORY_TRACT | Status: AC
  Administered 2019-01-05: 3 mL via RESPIRATORY_TRACT
  Filled 2019-01-05: qty 3

## 2019-01-05 NOTE — Progress Notes (Signed)
Pt ambulating around unit at this time with walker. VS stable.

## 2019-01-05 NOTE — Progress Notes (Signed)
Inpatient Diabetes Program Recommendations  AACE/ADA: New Consensus Statement on Inpatient Glycemic Control   Target Ranges:  Prepandial:   less than 140 mg/dL      Peak postprandial:   less than 180 mg/dL (1-2 hours)      Critically ill patients:  140 - 180 mg/dL   Results for HARIM, BI (MRN [id number]) as of 01/05/2019 10:09  Ref. Range 01/04/2019 07:42 01/04/2019 11:39 01/04/2019 16:13 01/04/2019 21:33 01/05/2019 07:46  Glucose-Capillary Latest Ref Range: 70 - 99 mg/dL 100 (H) 199 (H) 138 (H) 215 (H) 281 (H)    Review of Glycemic Control  Diabetes history: DM2 Outpatient Diabetes medications: Metformin XR 1000 mg BID, Levemir 20 units BID, Humalog 2-10 units TID with meals (Levemir and Humalog listed on H&P but not on current home medication list in chart) Current orders for Inpatient glycemic control: Levemir 20 units BID, Novolog 0-9 units TID with meals, Novolog 0-5 units QHS, Metformin XR 1000 mg BID; Decadron 4 mg Q8H  Inpatient Diabetes Program Recommendations:   Insulin - Basal: If Decadron is continued, please consider increasing Levemir to 24 units BID. Once Decadron is decreased and/or discontinued, Levemir may also need to be decreased.  Insulin - Meal Coverage: If Decadron is continued, please consider ordering Novolog 3 units TID with meals for meal coverage if patient eats at least 50% of meals. Once Decadron is decreased and/or discontinued, Novolog meal coverage may also need to be decreased and/or discontinued.  Thanks, Barnie Alderman, RN, MSN, CDE Diabetes Coordinator Inpatient Diabetes Program [phone] (Team Pager from 8am to 5pm)

## 2019-01-05 NOTE — Progress Notes (Signed)
Pharmacy Antibiotic Note  [name] is a 58 y.o. male admitted on 01/01/2019 with left elbow and RLE cellulitis.  Pharmacy has been consulted for cipro and vancomycin dosing. This is day #5 of antibiotics. His SCr is back to his baseline level  Plan:  1) continue Cipro 400 mg IV q12h   2) Vancomycin peak and trough levels assessed with today's doses  Current Dosing 1250mg  q12h  Dose 7/14  1107 1250mg   Peak 7/14   1324 62mcg/ml  Trough 7/14 2117 44mcg/ml  Ke 0.0573 T/12 12.1 hr Estimated Cmax/Cmin for this interval 46.0/25.2  Will hold this evening's dose and resume tomorrow AM with  New dose: Vancomycin 1500mg  q 24h  Will redraw peak/trough in 3-4 days    Height: 6' 0.01" (182.9 cm) Weight: (!) 301 lb 9.4 oz (136.8 kg) IBW/kg (Calculated) : 77.62  Temp (24hrs), Avg:98.5 F (36.9 C), Min:98.1 F (36.7 C), Max:98.9 F (37.2 C)  Recent Labs  Lab 01/01/19 1401 01/01/19 1703 01/02/19 0548 01/03/19 0457 01/04/19 0436 01/05/19 0352 01/05/19 1324 01/05/19 2051  WBC 10.4  --   --   --  7.3 6.8  --   --   CREATININE 1.13  --  1.03 1.02  1.01 1.15 1.02  --   --   LATICACIDVEN 2.2* 1.8  --   --   --   --   --   --   VANCOTROUGH  --   --   --   --   --   --   --  28*  VANCOPEAK  --   --   --   --   --   --  44*  --     Estimated Creatinine Clearance: 115.9 mL/min (by C-G formula based on SCr of 1.02 mg/dL).     Antimicrobials this admission: Cipro/vanc 7/10 >>  Microbiology results: 7/10 MRSA PCR negative.   Thank you for allowing pharmacy to be a part of this patient's care.  Lu Duffel, PharmD, BCPS Clinical Pharmacist 01/05/2019 10:14 PM

## 2019-01-05 NOTE — Progress Notes (Signed)
[place] at [place] NAME: [name]    MR#:  [id number]  DATE OF BIRTH:  06-10-1961  SUBJECTIVE:  CHIEF COMPLAINT:  No chief complaint on file.  Came with cellulitis and admitted to stepdown because he has tracheostomy for sleep apnea and uses ventilator every night.  No new complaints, continue to have significant pain on left upper extremity with swelling.  As per patient his swelling is still not better but he does not appear in any discomfort.  REVIEW OF SYSTEMS:  CONSTITUTIONAL: No fever, fatigue or weakness.  EYES: No blurred or double vision.  EARS, NOSE, AND THROAT: No tinnitus or ear pain.  RESPIRATORY: No cough, shortness of breath, wheezing or hemoptysis.  CARDIOVASCULAR: No chest pain, orthopnea, edema.  GASTROINTESTINAL: No nausea, vomiting, diarrhea or abdominal pain.  GENITOURINARY: No dysuria, hematuria.  ENDOCRINE: No polyuria, nocturia,  HEMATOLOGY: No anemia, easy bruising or bleeding SKIN: No rash or lesion. MUSCULOSKELETAL: No joint pain or arthritis.   NEUROLOGIC: No tingling, numbness, weakness.  PSYCHIATRY: No anxiety or depression.   ROS  DRUG ALLERGIES:   Allergies  Allergen Reactions  . Cephalexin     Other reaction(s): Unknown  . Hctz  [Hydrochlorothiazide]     Other reaction(s): Other (See Comments) hyponatremia  . Hydralazine   . Penicillins Hives    Has patient had a PCN reaction causing immediate rash, facial/tongue/throat swelling, SOB or lightheadedness with hypotension: Yes Has patient had a PCN reaction causing severe rash involving mucus membranes or skin necrosis: Yes Has patient had a PCN reaction that required hospitalization: Unknown Has patient had a PCN reaction occurring within the last 10 years: No If all of the above answers are "NO", then may proceed with Cephalosporin use.   Marland Kitchen Reserpine   . Zaroxolyn [Metolazone]     Other reaction(s): Unknown    VITALS:  Blood pressure  129/79, pulse 64, temperature 98.9 F (37.2 C), temperature source Oral, resp. rate 15, height 6' 0.01" (1.829 m), weight (!) 136.8 kg, SpO2 93 %.  PHYSICAL EXAMINATION:  GENERAL:  58 y.o.-year-old morbidly obese patient lying in the bed with no acute distress.  EYES: Pupils equal, round, reactive to light and accommodation. No scleral icterus. Extraocular muscles intact.  HEENT: Head atraumatic, normocephalic. Oropharynx and nasopharynx clear.  NECK:  Supple, no jugular venous distention. No thyroid enlargement, no tenderness.  Tracheostomy in place. LUNGS: Normal breath sounds bilaterally, no wheezing, rales,rhonchi or crepitation. No use of accessory muscles of respiration.  CARDIOVASCULAR: S1, S2 normal. No murmurs, rubs, or gallops.  ABDOMEN: Soft, nontender, nondistended. Bowel sounds present. No organomegaly or mass.  EXTREMITIES: Bilateral some pedal edema, cyanosis, or clubbing.  Both legs and left upper extremity has swelling and redness.  Able to move fingers on left hand. NEUROLOGIC: Cranial nerves II through XII are intact. Muscle strength 5/5 in all extremities. Sensation intact. Gait not checked.  PSYCHIATRIC: [name].  SKIN: No obvious rash, lesion, or ulcer.         Physical Exam LABORATORY PANEL:   CBC Recent Labs  Lab 01/05/19 0352  WBC 6.8  HGB 11.2*  HCT 33.8*  PLT 278   ------------------------------------------------------------------------------------------------------------------  Chemistries  Recent Labs  Lab 01/01/19 1401  01/05/19 0352  NA 131*   < > 133*  K 3.8   < > 4.5  CL 91*   < > 96*  CO2 27   < > 23  GLUCOSE 214*   < > 256*  BUN 22*   < > 26*  CREATININE 1.13   < > 1.02  CALCIUM 8.7*   < > 9.3  MG  --    < > 1.8  AST 33  --   --   ALT 28  --   --   ALKPHOS 79  --   --   BILITOT 0.8  --   --    < > = values in this interval not displayed.    ------------------------------------------------------------------------------------------------------------------  Cardiac Enzymes No results for input(s): TROPONINI in the last 168 hours. ------------------------------------------------------------------------------------------------------------------  RADIOLOGY:  No results found.  ASSESSMENT AND PLAN:   Active Problems:   Cellulitis  1.  Cellulitis left arm and right lower extremity.  Patient has allergy to penicillin and Keflex.  Vancomycin and Cipro ordered.  As he has persistent swelling and complaint of pain get CT scan of upper extremity to rule out deep infections. CT scan was negative for any drainable abscess or fasciitis. Added IV steroid by ICU physician to help with edema and inflammatory response 2.  Morbid obesity with sleep apnea.  Patient states that he sleeps with a ventilator at home. Monitor in CCU. 3.  Morbid obesity with a BMI of 40.69.  Weight loss needed. 4.  Type 2 diabetes mellitus put on sliding scale insulin and Lantus. 5.  Chronic diastolic congestive heart failure and edema.  Increase Lasix to twice daily dosing 6.  History of CAD and paroxysmal atrial fibrillation.  Decrease dose of amiodarone to 100 twice daily continue metoprolol and Lipitor.  Patient also anticoagulated with Xarelto 7.  Advised to cut down on alcohol.  Patient states he does not get shaky if he does not drink.  Continue thiamine 8.  Diabetic neuropathy on large dose of gabapentin   All the records are reviewed and case discussed with Care Management/Social Workerr. Management plans discussed with the patient, family and they are in agreement.  CODE STATUS: Full.  TOTAL TIME TAKING CARE OF THIS PATIENT: 35 minutes.    POSSIBLE D/C IN 1-2 DAYS, DEPENDING ON CLINICAL CONDITION.   Altamese DillingVaibhavkumar Torsten Weniger M.D on 01/05/2019   Between 7am to 6pm - Pager - [phone]  After 6pm go to [email or web address] - password [email or web address]  [facility]  Office  [phone]  CC: Primary care physician; Hillery AldoPatel, Sarah, MD  Note: This dictation was prepared with Dragon dictation along with smaller phrase technology. Any transcriptional errors that result from this process are unintentional.

## 2019-01-05 NOTE — Progress Notes (Signed)
Pharmacy Antibiotic Note  [name] is a 58 y.o. male admitted on 01/01/2019 with left elbow and RLE cellulitis.  Pharmacy has been consulted for cipro and vancomycin dosing. This is day #5 of antibiotics. His SCr is back to his baseline level  Plan:  1) continue Cipro 400 mg IV q12h   2) continue vancomycin 1250 mg IV Q 12 hrs. Goal AUC 400-550. Expected AUC: 523  SCr used: 1.15 BMI 40.9: Vd 0.5 L/kg Expected Cmin: 15.5 T1/2: 9.9 h  Obtain vancomycin peak and trough levels with today's doses  Height: 6' 0.01" (182.9 cm) Weight: (!) 301 lb 9.4 oz (136.8 kg) IBW/kg (Calculated) : 77.62  Temp (24hrs), Avg:98.3 F (36.8 C), Min:98.1 F (36.7 C), Max:98.6 F (37 C)  Recent Labs  Lab 01/01/19 1401 01/01/19 1703 01/02/19 0548 01/03/19 0457 01/04/19 0436 01/05/19 0352  WBC 10.4  --   --   --  7.3 6.8  CREATININE 1.13  --  1.03 1.02  1.01 1.15 1.02  LATICACIDVEN 2.2* 1.8  --   --   --   --     Estimated Creatinine Clearance: 115.9 mL/min (by C-G formula based on SCr of 1.02 mg/dL).     Antimicrobials this admission: Cipro/vanc 7/10 >>  Microbiology results: 7/10 MRSA PCR negative.   Thank you for allowing pharmacy to be a part of this patient's care.  Dallie Piles, PharmD 01/05/2019 12:22 PM

## 2019-01-05 NOTE — Consult Note (Signed)
PHARMACY CONSULT NOTE - FOLLOW UP  Pharmacy Consult for Electrolyte Monitoring and Replacement   Recent Labs: Potassium (mmol/L)  Date Value  01/05/2019 4.5  07/29/2014 4.4   Magnesium (mg/dL)  Date Value  01/05/2019 1.8   Calcium (mg/dL)  Date Value  01/05/2019 9.3   Calcium, Total (mg/dL)  Date Value  07/29/2014 9.1   Albumin (g/dL)  Date Value  01/01/2019 3.5  08/14/2013 3.6   Phosphorus (mg/dL)  Date Value  01/03/2019 2.9   Sodium (mmol/L)  Date Value  01/05/2019 133 (L)  07/29/2014 130 (L)   Corrected Ca: 9.5 mg/dL  Assessment: 58 y.o. male admitted on 01/01/2019 with left elbow and RLE cellulitis.   Goal of Therapy:  Potassium > 4 mmol/L Magnesium > 2 mg/dL Phosphorous > 3.0 mg/dL  Plan:   Magnesium somewhat improved following 2 grams IV magnesium sulfate on 7/13: repeat x1  Na noted to be borderline low but higher than on admission. Chart review shows he does run low at baseline. He is on lamotrigine and amiodarone PTA, which may contribute to this  Other electrolytes are at goal  Repeat electrolytes in am  Dallie Piles ,PharmD Clinical Pharmacist 01/05/2019 8:34 AM

## 2019-01-06 LAB — BASIC METABOLIC PANEL
Anion gap: 10 (ref 5–15)
BUN: 31 mg/dL — ABNORMAL HIGH (ref 6–20)
CO2: 25 mmol/L (ref 22–32)
Calcium: 9 mg/dL (ref 8.9–10.3)
Chloride: 97 mmol/L — ABNORMAL LOW (ref 98–111)
Creatinine, Ser: 0.97 mg/dL (ref 0.61–1.24)
GFR calc Af Amer: >60 mL/min (ref >60)
GFR calc non Af Amer: >60 mL/min (ref >60)
Glucose, Bld: 303 mg/dL — ABNORMAL HIGH (ref 70–99)
Potassium: 4.5 mmol/L (ref 3.5–5.1)
Sodium: 132 mmol/L — ABNORMAL LOW (ref 135–145)

## 2019-01-06 LAB — GLUCOSE, CAPILLARY
Glucose-Capillary: 283 mg/dL — ABNORMAL HIGH (ref 70–99)
Glucose-Capillary: 365 mg/dL — ABNORMAL HIGH (ref 70–99)
Glucose-Capillary: 331 mg/dL — ABNORMAL HIGH (ref 70–99)
Glucose-Capillary: 263 mg/dL — ABNORMAL HIGH (ref 70–99)

## 2019-01-06 LAB — MAGNESIUM: Magnesium: 1.8 mg/dL (ref 1.7–2.4)

## 2019-01-06 MED ORDER — INSULIN DETEMIR 100 UNIT/ML ~~LOC~~ SOLN
28.0000 [IU] | Freq: Two times a day (BID) | SUBCUTANEOUS | Status: DC
  Administered 2019-01-06 – 2019-01-07 (×2): 28 [IU] via SUBCUTANEOUS
  Filled 2019-01-06 (×3): qty 0.28

## 2019-01-06 MED ORDER — VANCOMYCIN HCL 1.5 G IV SOLR
1500.0000 mg | INTRAVENOUS | Status: DC
  Administered 2019-01-06: 1500 mg via INTRAVENOUS
  Filled 2019-01-06 (×2): qty 1500

## 2019-01-06 MED ORDER — INSULIN ASPART 100 UNIT/ML ~~LOC~~ SOLN
6.0000 [IU] | Freq: Three times a day (TID) | SUBCUTANEOUS | Status: DC
  Administered 2019-01-06 – 2019-01-07 (×2): 6 [IU] via SUBCUTANEOUS
  Filled 2019-01-06 (×2): qty 1

## 2019-01-06 MED ORDER — MAGNESIUM SULFATE 2 GM/50ML IV SOLN
2.0000 g | Freq: Once | INTRAVENOUS | Status: AC
  Administered 2019-01-06: 2 g via INTRAVENOUS
  Filled 2019-01-06: qty 50

## 2019-01-06 MED ORDER — VANCOMYCIN HCL IN DEXTROSE 1-5 GM/200ML-% IV SOLN
1000.0000 mg | INTRAVENOUS | Status: DC
  Filled 2019-01-06: qty 200

## 2019-01-06 MED ORDER — FUROSEMIDE 20 MG PO TABS
40.0000 mg | ORAL_TABLET | Freq: Every day | ORAL | Status: DC
  Administered 2019-01-07 – 2019-01-09 (×3): 40 mg via ORAL
  Filled 2019-01-06 (×4): qty 2

## 2019-01-06 NOTE — Progress Notes (Signed)
   CHIEF COMPLAINT:  Follow up sepsis  Subjective  LUE swelling and pian slowly improving On iV steroids and IV abx Chronic vent at night S/p trach     Objective   Review of Systems:  Gen:  Denies  fever, sweats, chills weight loss  HEENT: Denies blurred vision, double vision, ear pain, eye pain, hearing loss, nose bleeds, sore throat Cardiac:  No dizziness, chest pain or heaviness, chest tightness,edema, No JVD Resp:   No cough, -sputum production, -shortness of breath,-wheezing, -hemoptysis,  Gi: Denies swallowing difficulty, stomach pain, nausea or vomiting, diarrhea, constipation, bowel incontinence Gu:  Denies bladder incontinence, burning urine Ext:   +Joint pain, + swelling Skin: Denies  skin rash, easy bruising or bleeding or hives Endoc:  Denies polyuria, polydipsia , polyphagia or weight change Psych:   Denies depression, insomnia or hallucinations  Other:  All other systems negative  Examination:  General exam: Appears calm and comfortable  Respiratory system: Clear to auscultation. Respiratory effort normal. HEENT: [place]/AT, PERRLA, no thrush, no stridor. Cardiovascular system: S1 & S2 heard, RRR. No JVD, murmurs, rubs, gallops or clicks. No pedal edema. Gastrointestinal system: Abdomen is nondistended, soft and nontender. No organomegaly or masses felt. Normal bowel sounds heard. Central nervous system: Alert and oriented. No focal neurological deficits. Extremities: Symmetric 5 x 5 power. LUE swelling and redness Skin: b/l venous stasis and swelling and edema Psychiatry: Judgement and insight appear normal. Mood & affect appropriate.   VITALS:  height is 6' 0.01" (1.829 m) and weight is 136.8 kg (abnormal). His oral temperature is 98 F (36.7 C). His blood pressure is 122/76 and his pulse is 52 (abnormal). His respiration is 12 and oxygen saturation is 100%.   I personally reviewed Labs under Results section.  Radiology Reports No results found.      Assessment/Plan:   Severe sepsis with L Ext cellulitis Patient still with swelliing-slowly improving -continue IV abx -continue  IV decadron  OSA/OHS Chronic Trach and Vent at night   PLAN/REC: We are housing him in ICU/SDU due to requirement for nocturnal ventilation Continue current antibiotics  IV Decadron for swelling Since no BCs sent, we will not have a culture to direct [place] CT LUE -Per report, no evidence of abscess or fasciitis Continue current analgesics  Continue IV meds x 24 hrs and then transition to Oral meds and plan for D/C in 24-48 hrs  [name] Patricia Pesa, M.D.  [facility]  Medical Director [place] Director [facility] Cardio-Pulmonary Department

## 2019-01-06 NOTE — Consult Note (Signed)
PHARMACY CONSULT NOTE - FOLLOW UP  Pharmacy Consult for Electrolyte Monitoring and Replacement   Recent Labs: Potassium (mmol/L)  Date Value  01/06/2019 4.5  07/29/2014 4.4   Magnesium (mg/dL)  Date Value  01/06/2019 1.8   Calcium (mg/dL)  Date Value  01/06/2019 9.0   Calcium, Total (mg/dL)  Date Value  07/29/2014 9.1   Albumin (g/dL)  Date Value  01/01/2019 3.5  08/14/2013 3.6   Phosphorus (mg/dL)  Date Value  01/03/2019 2.9   Sodium (mmol/L)  Date Value  01/06/2019 132 (L)  07/29/2014 130 (L)   Corrected Ca: 9.5 mg/dL  Assessment: 58 y.o. male admitted on 01/01/2019 with left elbow and RLE cellulitis.   Goal of Therapy:  Potassium > 4 mmol/L Magnesium > 2 mg/dL Phosphorous > 3.0 mg/dL  Plan:   Magnesium somewhat improved following 2 grams IV magnesium sulfate on 7/14: repeat x1  Na noted to be borderline low but higher than on admission. Chart review shows he does run low at baseline. He is on lamotrigine and amiodarone PTA, which may contribute to this  Other electrolytes are at goal  Repeat electrolytes in am  Dallie Piles ,PharmD Clinical Pharmacist 01/06/2019 12:54 PM

## 2019-01-06 NOTE — Progress Notes (Signed)
[employer] [facility] [place] [facility]   PATIENT NAME: [name]    MR#:  [id number]  DATE OF BIRTH:  March 10, 1961  SUBJECTIVE:  CHIEF COMPLAINT:  No chief complaint on file.  Came with cellulitis and admitted to stepdown because he has tracheostomy for sleep apnea and uses ventilator every night.  No new complaints, continue to have significant pain on left upper extremity with swelling.  As per patient his swelling is better but he does not appear in any discomfort.  REVIEW OF SYSTEMS:  CONSTITUTIONAL: No fever, fatigue or weakness.  EYES: No blurred or double vision.  EARS, NOSE, AND THROAT: No tinnitus or ear pain.  RESPIRATORY: No cough, shortness of breath, wheezing or hemoptysis.  CARDIOVASCULAR: No chest pain, orthopnea, edema.  GASTROINTESTINAL: No nausea, vomiting, diarrhea or abdominal pain.  GENITOURINARY: No dysuria, hematuria.  ENDOCRINE: No polyuria, nocturia,  HEMATOLOGY: No anemia, easy bruising or bleeding SKIN: No rash or lesion. MUSCULOSKELETAL: No joint pain or arthritis.   NEUROLOGIC: No tingling, numbness, weakness.  PSYCHIATRY: No anxiety or depression.   ROS  DRUG ALLERGIES:   Allergies  Allergen Reactions  . Cephalexin     Other reaction(s): Unknown  . Hctz  [Hydrochlorothiazide]     Other reaction(s): Other (See Comments) hyponatremia  . Hydralazine   . Penicillins Hives    Has patient had a PCN reaction causing immediate rash, facial/tongue/throat swelling, SOB or lightheadedness with hypotension: Yes Has patient had a PCN reaction causing severe rash involving mucus membranes or skin necrosis: Yes Has patient had a PCN reaction that required hospitalization: Unknown Has patient had a PCN reaction occurring within the last 10 years: No If all of the above answers are "NO", then may proceed with Cephalosporin use.   Marland Kitchen. Reserpine   . Zaroxolyn [Metolazone]     Other reaction(s): Unknown    VITALS:  Blood pressure 120/75, pulse  77, temperature 98 F (36.7 C), temperature source Oral, resp. rate 12, height 6' 0.01" (1.829 m), weight (!) 136.8 kg, SpO2 100 %.  PHYSICAL EXAMINATION:  GENERAL:  58 y.o.-year-old morbidly obese patient lying in the bed with no acute distress.  EYES: Pupils equal, round, reactive to light and accommodation. No scleral icterus. Extraocular muscles intact.  HEENT: Head atraumatic, normocephalic. Oropharynx and nasopharynx clear.  NECK:  Supple, no jugular venous distention. No thyroid enlargement, no tenderness.  Tracheostomy in place. LUNGS: Normal breath sounds bilaterally, no wheezing, rales,rhonchi or crepitation. No use of accessory muscles of respiration.  CARDIOVASCULAR: S1, S2 normal. No murmurs, rubs, or gallops.  ABDOMEN: Soft, nontender, nondistended. Bowel sounds present. No organomegaly or mass.  EXTREMITIES: Bilateral some pedal edema, cyanosis, or clubbing.  Both legs and left upper extremity has swelling and redness.  Able to move fingers on left hand. NEUROLOGIC: Cranial nerves II through XII are intact. Muscle strength 5/5 in all extremities. Sensation intact. Gait not checked.  PSYCHIATRIC: [name].  SKIN: No obvious rash, lesion, or ulcer.         Physical Exam LABORATORY PANEL:   CBC Recent Labs  Lab 01/05/19 0352  WBC 6.8  HGB 11.2*  HCT 33.8*  PLT 278   ------------------------------------------------------------------------------------------------------------------  Chemistries  Recent Labs  Lab 01/01/19 1401  01/06/19 0250  NA 131*   < > 132*  K 3.8   < > 4.5  CL 91*   < > 97*  CO2 27   < > 25  GLUCOSE 214*   < >  303*  BUN 22*   < > 31*  CREATININE 1.13   < > 0.97  CALCIUM 8.7*   < > 9.0  MG  --    < > 1.8  AST 33  --   --   ALT 28  --   --   ALKPHOS 79  --   --   BILITOT 0.8  --   --    < > = values in this interval not displayed.    ------------------------------------------------------------------------------------------------------------------  Cardiac Enzymes No results for input(s): TROPONINI in the last 168 hours. ------------------------------------------------------------------------------------------------------------------  RADIOLOGY:  No results found.  ASSESSMENT AND PLAN:   Active Problems:   Cellulitis  1.  Cellulitis left arm and right lower extremity.  Patient has allergy to penicillin and Keflex.  Vancomycin and Cipro ordered.  As he has persistent swelling and complaint of pain get CT scan of upper extremity to rule out deep infections. CT scan was negative for any drainable abscess or fasciitis. Added IV steroid by ICU physician to help with edema and inflammatory response Patient is feeling better after getting IV steroid and agreeable to try oral steroid and antibiotics in next 1 to 2 days so can be discharged home. 2.  Morbid obesity with sleep apnea.  Patient states that he sleeps with a ventilator at home. Monitor in CCU. 3.  Morbid obesity with a BMI of 40.69.  Weight loss needed. 4.  Type 2 diabetes mellitus put on sliding scale insulin and Lantus. 5.  Chronic diastolic congestive heart failure and edema.  Increase Lasix to twice daily dosing 6.  History of CAD and paroxysmal atrial fibrillation.  Decrease dose of amiodarone to 100 twice daily continue metoprolol and Lipitor.  Patient also anticoagulated with Xarelto 7.  Advised to cut down on alcohol.  Patient states he does not get shaky if he does not drink.  Continue thiamine 8.  Diabetic neuropathy on large dose of gabapentin   All the records are reviewed and case discussed with Care Management/Social Workerr. Management plans discussed with the patient, family and they are in agreement.  CODE STATUS: Full.  TOTAL TIME TAKING CARE OF THIS PATIENT: 35 minutes.    POSSIBLE D/C IN 1-2 DAYS, DEPENDING ON CLINICAL  CONDITION.   Vaughan Basta M.D on 01/06/2019   Between 7am to 6pm - Pager - [phone]  After 6pm go to [email or web address] - password [email or web address] [facility]  Office  [phone]  CC: Primary care physician; Denton Lank, MD  Note: This dictation was prepared with Dragon dictation along with smaller phrase technology. Any transcriptional errors that result from this process are unintentional.

## 2019-01-06 NOTE — Progress Notes (Signed)
Physical Therapy Treatment Patient Details Name: [name] MRN: [id number] DOB: [birth year] Today's Date: 01/06/2019    History of Present Illness presented to ER from home secondary to L UE pain; admitted for management of bilat LE, L UE cellulitis.  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    PT Comments    Pt in recliner.  Stated he has been walking with nursing on unit as able.  Stood and was able to walk 400' on unit with walker and turns x 8 with min guard.  Overall progressing well with increased activity tolerance.  Pt did wear mask over his nose/mouth and another paper mask around his trach when out in the hallway for his comfort.  Sats 89% after gait and quickly increased to mid 90's.  Progressing well with mobility.   Follow Up Recommendations  Outpatient PT     Equipment Recommendations    Bariatric RW   Recommendations for Other Services       Precautions / Restrictions Precautions Precautions: Fall Precaution Comments: trach Restrictions Weight Bearing Restrictions: No    Mobility  Bed Mobility               General bed mobility comments: seated in recliner beginning/end of treatment session  Transfers Overall transfer level: Needs assistance Equipment used: Rolling walker (2 wheeled) Transfers: Sit to/from Stand Sit to Stand: Min guard            Ambulation/Gait Ambulation/Gait assistance: Min Gaffer (Feet): 400 Feet Assistive device: Rolling walker (2 wheeled)   Gait velocity: decreased   General Gait Details: gererallty steady gait at a good pace with no LOB or buckling   Stairs             Wheelchair Mobility    Modified Rankin (Stroke Patients Only)       Balance Overall balance assessment: Needs assistance Sitting-balance support: No upper extremity supported;Feet supported Sitting balance-Leahy Scale: Good      Standing balance support: No upper extremity supported Standing balance-Leahy Scale: Fair                              Cognition Arousal/Alertness: Awake/alert Behavior During Therapy: WFL for tasks assessed/performed Overall Cognitive Status: Within Functional Limits for tasks assessed                                        Exercises      General Comments        Pertinent Vitals/Pain Pain Assessment: No/denies pain    Home Living                      Prior Function            PT Goals (current goals can now be found in the care plan section) Progress towards PT goals: Progressing toward goals    Frequency    Min 2X/week      PT Plan Current plan remains appropriate    Co-evaluation              AM-PAC PT "6 Clicks" Mobility   Outcome Measure  Help needed turning from your back to your side while in a flat bed without using bedrails?: A Little Help needed moving from lying on  your back to sitting on the side of a flat bed without using bedrails?: A Little Help needed moving to and from a bed to a chair (including a wheelchair)?: A Little Help needed standing up from a chair using your arms (e.g., wheelchair or bedside chair)?: A Little Help needed to walk in hospital room?: A Little Help needed climbing 3-5 steps with a railing? : A Little 6 Click Score: 18    End of Session Equipment Utilized During Treatment: Gait belt Activity Tolerance: Patient tolerated treatment well Patient left: in chair;with call bell/phone within reach Nurse Communication: Mobility status       Time: 0211-0226 PT Time Calculation (min) (ACUTE ONLY): 15 min  Charges:  $Gait Training: 8-22 mins                     Danielle DessSarah Cleta [name], PTA 01/06/19, 3:23 PM

## 2019-01-06 NOTE — Progress Notes (Signed)
Inpatient Diabetes Program Recommendations  AACE/ADA: New Consensus Statement on Inpatient Glycemic Control  Target Ranges:  Prepandial:   less than 140 mg/dL      Peak postprandial:   less than 180 mg/dL (1-2 hours)      Critically ill patients:  140 - 180 mg/dL   Results for [name], [name] (MRN [id number]) as of 01/06/2019 09:09  Ref. Range 01/05/2019 07:46 01/05/2019 11:42 01/05/2019 16:27 01/05/2019 21:43 01/06/2019 07:30  Glucose-Capillary Latest Ref Range: 70 - 99 mg/dL 281 (H) 293 (H) 318 (H) 246 (H) 283 (H)   Review of Glycemic Control  Diabetes history: DM2 Outpatient Diabetes medications: Metformin XR 1000 mg BID, Levemir 20 units BID, Humalog 2-10 units TID with meals (Levemir and Humalog listed on H&P but not on current home medication list in chart) Current orders for Inpatient glycemic control: Levemir 24 units BID, Novolog 0-9 units TID with meals, Novolog 0-5 units QHS, Novolog 3 units TID with meals, Metformin XR 1000 mg BID; Decadron 4 mg Q8H  Inpatient Diabetes Program Recommendations:   Insulin - Basal: If Decadron is continued, please consider increasing Levemir to 28 units BID. Once Decadron is decreased and/or discontinued, Levemir may also need to be decreased.  Insulin - Meal Coverage: If Decadron is continued, please consider increasing meal coverage to Novolog 6 units TID if patient eats at least 50% of meals. Once Decadron is decreased and/or discontinued, Novolog meal coverage may also need to be decreased and/or discontinued.  Thanks, Barnie Alderman, RN, MSN, CDE Diabetes Coordinator Inpatient Diabetes Program [phone] (Team Pager from 8am to 5pm)

## 2019-01-07 LAB — BASIC METABOLIC PANEL
Anion gap: 10 (ref 5–15)
BUN: 31 mg/dL — ABNORMAL HIGH (ref 6–20)
CO2: 25 mmol/L (ref 22–32)
Calcium: 9.3 mg/dL (ref 8.9–10.3)
Chloride: 96 mmol/L — ABNORMAL LOW (ref 98–111)
Creatinine, Ser: 0.93 mg/dL (ref 0.61–1.24)
GFR calc Af Amer: >60 mL/min (ref >60)
GFR calc non Af Amer: >60 mL/min (ref >60)
Glucose, Bld: 287 mg/dL — ABNORMAL HIGH (ref 70–99)
Potassium: 4.8 mmol/L (ref 3.5–5.1)
Sodium: 131 mmol/L — ABNORMAL LOW (ref 135–145)

## 2019-01-07 LAB — GLUCOSE, CAPILLARY
Glucose-Capillary: 258 mg/dL — ABNORMAL HIGH (ref 70–99)
Glucose-Capillary: 375 mg/dL — ABNORMAL HIGH (ref 70–99)
Glucose-Capillary: 301 mg/dL — ABNORMAL HIGH (ref 70–99)
Glucose-Capillary: 331 mg/dL — ABNORMAL HIGH (ref 70–99)

## 2019-01-07 LAB — MAGNESIUM: Magnesium: 1.9 mg/dL (ref 1.7–2.4)

## 2019-01-07 MED ORDER — INSULIN DETEMIR 100 UNIT/ML ~~LOC~~ SOLN
33.0000 [IU] | Freq: Two times a day (BID) | SUBCUTANEOUS | Status: DC
  Administered 2019-01-07 – 2019-01-08 (×2): 33 [IU] via SUBCUTANEOUS
  Filled 2019-01-07 (×3): qty 0.33

## 2019-01-07 MED ORDER — DEXAMETHASONE 4 MG PO TABS
8.0000 mg | ORAL_TABLET | Freq: Three times a day (TID) | ORAL | Status: DC
  Administered 2019-01-07 – 2019-01-08 (×3): 8 mg via ORAL
  Filled 2019-01-07 (×3): qty 2

## 2019-01-07 MED ORDER — CIPROFLOXACIN HCL 500 MG PO TABS
500.0000 mg | ORAL_TABLET | Freq: Two times a day (BID) | ORAL | Status: DC
  Administered 2019-01-07 – 2019-01-09 (×4): 500 mg via ORAL
  Filled 2019-01-07 (×6): qty 1

## 2019-01-07 MED ORDER — INSULIN ASPART 100 UNIT/ML ~~LOC~~ SOLN
8.0000 [IU] | Freq: Three times a day (TID) | SUBCUTANEOUS | Status: DC
  Administered 2019-01-07 – 2019-01-08 (×4): 8 [IU] via SUBCUTANEOUS
  Filled 2019-01-07 (×2): qty 1

## 2019-01-07 MED ORDER — SULFAMETHOXAZOLE-TRIMETHOPRIM 800-160 MG PO TABS
1.0000 | ORAL_TABLET | Freq: Two times a day (BID) | ORAL | Status: DC
  Administered 2019-01-07 – 2019-01-09 (×5): 1 via ORAL
  Filled 2019-01-07 (×7): qty 1

## 2019-01-07 NOTE — Progress Notes (Signed)
Physical Therapy Treatment Patient Details Name: [name] PoleJim Paul [name] MRN: [id number] DOB: 03-03-1961 Today's Date: 01/07/2019    History of Present Illness presented to ER from home secondary to L UE pain; admitted for management of bilat LE, L UE cellulitis.  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    PT Comments    Pt stood and walked 400' with walker and min guard/supervision.  Burning in quads/fatigue limited further distance.  Sats 89% on room air after gait and returned to baseline with seated rest.  Follow Up Recommendations  Outpatient PT     Equipment Recommendations  Rolling walker with 5" wheels    Recommendations for Other Services       Precautions / Restrictions Precautions Precautions: Fall Precaution Comments: trach Restrictions Weight Bearing Restrictions: No    Mobility  Bed Mobility               General bed mobility comments: seated in recliner beginning/end of treatment session  Transfers Overall transfer level: Needs assistance Equipment used: Rolling walker (2 wheeled) Transfers: Sit to/from Stand Sit to Stand: Min guard         General transfer comment: broad BOS, heavy use of UEs to assist with lift off  Ambulation/Gait Ambulation/Gait assistance: Min guard;Supervision Gait Distance (Feet): 400 Feet Assistive device: Rolling walker (2 wheeled) Gait Pattern/deviations: Step-through pattern Gait velocity: decreased   General Gait Details: gererallty steady gait at a good pace with no LOB or buckling   Stairs             Wheelchair Mobility    Modified Rankin (Stroke Patients Only)       Balance Overall balance assessment: Needs assistance;Modified Independent Sitting-balance support: No upper extremity supported;Feet supported Sitting balance-Leahy Scale: Good     Standing balance support: Bilateral upper extremity supported Standing  balance-Leahy Scale: Good                              Cognition Arousal/Alertness: Awake/alert Behavior During Therapy: WFL for tasks assessed/performed Overall Cognitive Status: Within Functional Limits for tasks assessed                                        Exercises Other Exercises Other Exercises: Pt instructed in energy conservation strategies and home/routines modifications to maximize safety/indep with handout provided to support recall and carryover Other Exercises: Pt instructed in retrograde massage for L hand and positioning to elevate LUE to minimize edema; retrograde massage performed on LUE with noticeable difference afterward both in edema in the back of the hand and pt's ability to make a fist    General Comments        Pertinent Vitals/Pain Pain Assessment: No/denies pain Pain Intervention(s): Monitored during session    Home Living                      Prior Function            PT Goals (current goals can now be found in the care plan section) Acute Rehab PT Goals Patient Stated Goal: to return home when ready, to make L UE feel better Progress towards PT goals: Progressing toward goals    Frequency    Min 2X/week      PT Plan Current  plan remains appropriate    Co-evaluation              AM-PAC PT "6 Clicks" Mobility   Outcome Measure  Help needed turning from your back to your side while in a flat bed without using bedrails?: A Little Help needed moving from lying on your back to sitting on the side of a flat bed without using bedrails?: A Little Help needed moving to and from a bed to a chair (including a wheelchair)?: A Little Help needed standing up from a chair using your arms (e.g., wheelchair or bedside chair)?: A Little Help needed to walk in hospital room?: A Little Help needed climbing 3-5 steps with a railing? : A Little 6 Click Score: 18    End of Session Equipment Utilized During  Treatment: Gait belt Activity Tolerance: Patient tolerated treatment well Patient left: in chair;with call bell/phone within reach         Time: 3664-4034 PT Time Calculation (min) (ACUTE ONLY): 12 min  Charges:  $Gait Training: 8-22 mins                     Chesley Noon, PTA 01/07/19, 4:40 PM

## 2019-01-07 NOTE — Progress Notes (Signed)
Inpatient Diabetes Program Recommendations  AACE/ADA: New Consensus Statement on Inpatient Glycemic Control  Target Ranges:  Prepandial:   less than 140 mg/dL      Peak postprandial:   less than 180 mg/dL (1-2 hours)      Critically ill patients:  140 - 180 mg/dL   Results for [name], [name] (MRN [id number]) as of 01/07/2019 08:15  Ref. Range 01/06/2019 07:30 01/06/2019 11:32 01/06/2019 16:30 01/06/2019 22:18 01/07/2019 08:06  Glucose-Capillary Latest Ref Range: 70 - 99 mg/dL 283 (H) 365 (H) 331 (H) 263 (H) 258 (H)    Review of Glycemic Control  Diabetes history:DM2 Outpatient Diabetes medications:Metformin XR 1000 mg BID, Levemir 20 units BID, Humalog 2-10 units TID with meals (Levemir and Humalog listed on H&P but not on current home medication list in chart) Current orders for Inpatient glycemic control:Levemir 28 units BID, Novolog 0-9 units TID with meals, Novolog 0-5 units QHS, Novolog 6 units TID with meals, Metformin XR 1000 mg BID; Decadron 4 mg Q8H  Inpatient Diabetes Program Recommendations:  Insulin - Basal:If Decadron is continued, please consider increasing Levemir to 33 units BID. Once Decadron is decreased and/or discontinued, Levemir may also need to be decreased.  Insulin - Meal Coverage:If Decadron is continued, please consider increasing meal coverage to Novolog 8 units TID if patient eats at least 50% of meals. Once Decadron is decreased and/or discontinued, Novolog meal coverage may also need to be decreased and/or discontinued.  Thanks, Barnie Alderman, RN, MSN, CDE Diabetes Coordinator Inpatient Diabetes Program [phone] (Team Pager from 8am to 5pm)

## 2019-01-07 NOTE — Progress Notes (Signed)
Occupational Therapy Treatment Patient Details Name: [name] MRN: [id number] DOB: [birth year] Today's Date: 01/07/2019    History of present illness presented to ER from home secondary to L UE pain; admitted for management of bilat LE, L UE cellulitis.  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   OT comments  Pt seen for OT tx session this date. Pt eager to participate and reporting feeling better over the past couple days. Pt noted with continued pitting edema in LUE. Pt instructed in self and assisted retrograde massage with therapist performing to support understanding. Noted improvement in edema afterwards with pt noting improvement in ability to make a fist with his hand as well as visually noticeable. Instructed pt in caregiver training/education to perform at home. Pt instructed in LUE positioning to minimize edema. Pt also instructed in ECS with handout provided. Pt verbalized understanding and need to incorporate strategies into daily routine so he would not get too SOB or over exert himself to minimize risk of falling or readmission. Pt continues to benefit from skilled OT services. Continue to recommend OP OT.    Follow Up Recommendations  Outpatient OT    Equipment Recommendations  Toilet riser;Other (comment)(reacher)    Recommendations for Other Services      Precautions / Restrictions Precautions Precautions: Fall Precaution Comments: trach Restrictions Weight Bearing Restrictions: No       Mobility Bed Mobility               General bed mobility comments: seated in recliner beginning/end of treatment session  Transfers                      Balance                                           ADL either performed or assessed with clinical judgement   ADL Overall ADL's : Needs assistance/impaired                                        General ADL Comments: PRN Min A for LB ADL, difficulty/increased effort to perform, Sup to CGA for toilet transfers to comfort height toilet using RW     Vision Baseline Vision/History: Wears glasses Wears Glasses: At all times Patient Visual Report: No change from baseline     Perception     Praxis      Cognition Arousal/Alertness: Awake/alert Behavior During Therapy: WFL for tasks assessed/performed Overall Cognitive Status: Within Functional Limits for tasks assessed                                          Exercises Other Exercises Other Exercises: Pt instructed in energy conservation strategies and home/routines modifications to maximize safety/indep with handout provided to support recall and carryover Other Exercises: Pt instructed in retrograde massage for L hand and positioning to elevate LUE to minimize edema; retrograde massage performed on LUE with noticeable difference afterward both in edema in the back of the hand and pt's ability to make a fist   Shoulder Instructions       General Comments  Pertinent Vitals/ Pain       Pain Assessment: No/denies pain Pain Intervention(s): Monitored during session  Home Living                                          Prior Functioning/Environment              Frequency  Min 1X/week        Progress Toward Goals  OT Goals(current goals can now be found in the care plan section)  Progress towards OT goals: Progressing toward goals  Acute Rehab OT Goals Patient Stated Goal: to return home when ready, to make L UE feel better OT Goal Formulation: With patient Time For Goal Achievement: 01/18/19 Potential to Achieve Goals: Good  Plan Discharge plan remains appropriate;Frequency remains appropriate    Co-evaluation                 AM-PAC OT "6 Clicks" Daily Activity     Outcome Measure   Help from another person eating meals?: None Help from another person  taking care of personal grooming?: None Help from another person toileting, which includes using toliet, bedpan, or urinal?: A Little Help from another person bathing (including washing, rinsing, drying)?: A Little Help from another person to put on and taking off regular upper body clothing?: None Help from another person to put on and taking off regular lower body clothing?: A Little 6 Click Score: 21    End of Session    OT Visit Diagnosis: Other abnormalities of gait and mobility (R26.89);History of falling (Z91.81);Muscle weakness (generalized) (M62.81)   Activity Tolerance Patient tolerated treatment well   Patient Left in chair;with call bell/phone within reach;Other (comment)(PT in room for session)   Nurse Communication          Time: [id number] OT Time Calculation (min): 28 min  Charges: OT General Charges $OT Visit: 1 Visit OT Treatments $Self Care/Home Management : 8-22 mins $Therapeutic Activity: 8-22 mins  Richrd PrimeJamie Stiller, MPH, MS, OTR/L ascom [phone] 01/07/19, 3:12 PM

## 2019-01-07 NOTE — Progress Notes (Signed)
[place] [facility] [place] NAME: [name]    MR#:  [id number]  DATE OF BIRTH:  25-Dec-1960  SUBJECTIVE:  CHIEF COMPLAINT:  No chief complaint on file.  Patient continues to complaint of swelling in his elbow and arm  REVIEW OF SYSTEMS:  CONSTITUTIONAL: No fever, fatigue or weakness.  EYES: No blurred or double vision.  EARS, NOSE, AND THROAT: No tinnitus or ear pain.  RESPIRATORY: No cough, shortness of breath, wheezing or hemoptysis.  CARDIOVASCULAR: No chest pain, orthopnea, edema.  GASTROINTESTINAL: No nausea, vomiting, diarrhea or abdominal pain.  GENITOURINARY: No dysuria, hematuria.  ENDOCRINE: No polyuria, nocturia,  HEMATOLOGY: No anemia, easy bruising or bleeding SKIN: No rash or lesion. MUSCULOSKELETAL: No joint pain or arthritis.   NEUROLOGIC: No tingling, numbness, weakness.  PSYCHIATRY: No anxiety or depression.   ROS  DRUG ALLERGIES:   Allergies  Allergen Reactions  . Cephalexin     Other reaction(s): Unknown  . Hctz  [Hydrochlorothiazide]     Other reaction(s): Other (See Comments) hyponatremia  . Hydralazine   . Penicillins Hives    Has patient had a PCN reaction causing immediate rash, facial/tongue/throat swelling, SOB or lightheadedness with hypotension: Yes Has patient had a PCN reaction causing severe rash involving mucus membranes or skin necrosis: Yes Has patient had a PCN reaction that required hospitalization: Unknown Has patient had a PCN reaction occurring within the last 10 years: No If all of the above answers are "NO", then may proceed with Cephalosporin use.   Marland Kitchen Reserpine   . Zaroxolyn [Metolazone]     Other reaction(s): Unknown    VITALS:  Blood pressure 131/76, pulse (!) 56, temperature 98.7 F (37.1 C), temperature source Oral, resp. rate 12, height 6' 0.01" (1.829 m), weight (!) 136.8 kg, SpO2 100 %.  PHYSICAL EXAMINATION:  GENERAL:  58 y.o.-year-old morbidly obese patient lying in the bed  with no acute distress.  EYES: Pupils equal, round, reactive to light and accommodation. No scleral icterus. Extraocular muscles intact.  HEENT: Head atraumatic, normocephalic. Oropharynx and nasopharynx clear.  NECK:  Supple, no jugular venous distention. No thyroid enlargement, no tenderness.  Tracheostomy in place. LUNGS: Normal breath sounds bilaterally, no wheezing, rales,rhonchi or crepitation. No use of accessory muscles of respiration.  CARDIOVASCULAR: S1, S2 normal. No murmurs, rubs, or gallops.  ABDOMEN: Soft, nontender, nondistended. Bowel sounds present. No organomegaly or mass.  EXTREMITIES: Bilateral some pedal edema, cyanosis, or clubbing.  Both legs and left upper extremity has swelling and redness.  Able to move fingers on left hand. NEUROLOGIC: Cranial nerves II through XII are intact. Muscle strength 5/5 in all extremities. Sensation intact. Gait not checked.  PSYCHIATRIC: [name].  SKIN: No obvious rash, lesion, or ulcer.         Physical Exam LABORATORY PANEL:   CBC Recent Labs  Lab 01/05/19 0352  WBC 6.8  HGB 11.2*  HCT 33.8*  PLT 278   ------------------------------------------------------------------------------------------------------------------  Chemistries  Recent Labs  Lab 01/01/19 1401  01/07/19 0707  NA 131*   < > 131*  K 3.8   < > 4.8  CL 91*   < > 96*  CO2 27   < > 25  GLUCOSE 214*   < > 287*  BUN 22*   < > 31*  CREATININE 1.13   < > 0.93  CALCIUM 8.7*   < > 9.3  MG  --    < >  1.9  AST 33  --   --   ALT 28  --   --   ALKPHOS 79  --   --   BILITOT 0.8  --   --    < > = values in this interval not displayed.   ------------------------------------------------------------------------------------------------------------------  Cardiac Enzymes No results for input(s): TROPONINI in the last 168  hours. ------------------------------------------------------------------------------------------------------------------  RADIOLOGY:  No results found.  ASSESSMENT AND PLAN:   Active Problems:   Cellulitis  1.  Cellulitis left arm and right lower extremity.  Patient has allergy to penicillin and Keflex.  Continue Vancomycin and Cipro ordered.  As he has persistent swelling and complaint of pain get CT scan of upper extremity to rule out deep infections. CT scan was negative for any drainable abscess or fasciitis. A continue steroids  2.  Morbid obesity with sleep apnea.  Patient states that he sleeps with a ventilator at home. Monitor in CCU. 3.  Morbid obesity with a BMI of 40.69.  Weight loss needed. 4.  Type 2 diabetes mellitus put on sliding scale insulin and Lantus. 5.  Chronic diastolic congestive heart failure and edema.  Increase Lasix to twice daily dosing 6.  History of CAD and paroxysmal atrial fibrillation.  Decrease dose of amiodarone to 100 twice daily continue metoprolol and Lipitor.  Patient also anticoagulated with Xarelto 7.  Advised to cut down on alcohol.  Patient states he does not get shaky if he does not drink.  Continue thiamine 8.  Diabetic neuropathy on large dose of gabapentin   All the records are reviewed and case discussed with Care Management/Social Workerr. Management plans discussed with the patient, family and they are in agreement.  CODE STATUS: Full.  TOTAL TIME TAKING CARE OF THIS PATIENT: 35 minutes.    POSSIBLE D/C IN 1-2 DAYS, DEPENDING ON CLINICAL CONDITION.   Auburn BilberryShreyang Bianna Haran M.D on 01/07/2019   Between 7am to 6pm - Pager - [phone]  After 6pm go to [email or web address] - password [email or web address]  [facility]  Office  [phone]  CC: Primary care physician; Hillery AldoPatel, Sarah, MD  Note: This dictation was prepared with Dragon dictation along with smaller phrase technology. Any transcriptional errors that result from this  process are unintentional.

## 2019-01-07 NOTE — Consult Note (Signed)
PHARMACY CONSULT NOTE - FOLLOW UP  Pharmacy Consult for Electrolyte Monitoring and Replacement   Recent Labs: Potassium (mmol/L)  Date Value  01/07/2019 4.8  07/29/2014 4.4   Magnesium (mg/dL)  Date Value  01/07/2019 1.9   Calcium (mg/dL)  Date Value  01/07/2019 9.3   Calcium, Total (mg/dL)  Date Value  07/29/2014 9.1   Albumin (g/dL)  Date Value  01/01/2019 3.5  08/14/2013 3.6   Phosphorus (mg/dL)  Date Value  01/03/2019 2.9   Sodium (mmol/L)  Date Value  01/07/2019 131 (L)  07/29/2014 130 (L)   Corrected Ca: 9.5 mg/dL  Assessment: 58 y.o. male admitted on 01/01/2019 with left elbow and RLE cellulitis.   Goal of Therapy:  Potassium > 4 mmol/L Magnesium > 2 mg/dL Phosphorous > 3.0 mg/dL  Plan:   Magnesium borderline goal following 2 grams IV magnesium sulfate on 7/14: repeat level in am  Na noted to be borderline low but higher than on admission. Chart review shows he does run low at baseline. He is on lamotrigine and amiodarone PTA, which may contribute to this  Other electrolytes are at goal  Repeat electrolytes in am  Dallie Piles ,PharmD Clinical Pharmacist 01/07/2019 8:00 AM

## 2019-01-08 DIAGNOSIS — L03116 Cellulitis of left lower limb: Secondary | ICD-10-CM

## 2019-01-08 LAB — GLUCOSE, CAPILLARY
Glucose-Capillary: 300 mg/dL — ABNORMAL HIGH (ref 70–99)
Glucose-Capillary: 335 mg/dL — ABNORMAL HIGH (ref 70–99)
Glucose-Capillary: 402 mg/dL — ABNORMAL HIGH (ref 70–99)
Glucose-Capillary: 360 mg/dL — ABNORMAL HIGH (ref 70–99)

## 2019-01-08 LAB — CBC
HCT: 39.4 % (ref 39.0–52.0)
Hemoglobin: 12.7 g/dL — ABNORMAL LOW (ref 13.0–17.0)
MCH: 29.1 pg (ref 26.0–34.0)
MCHC: 32.2 g/dL (ref 30.0–36.0)
MCV: 90.2 fL (ref 80.0–100.0)
Platelets: 397 10*3/uL (ref 150–400)
RBC: 4.37 MIL/uL (ref 4.22–5.81)
RDW: 13.3 % (ref 11.5–15.5)
WBC: 10.6 10*3/uL — ABNORMAL HIGH (ref 4.0–10.5)
nRBC: 0 % (ref 0.0–0.2)

## 2019-01-08 LAB — BASIC METABOLIC PANEL
Anion gap: 12 (ref 5–15)
BUN: 35 mg/dL — ABNORMAL HIGH (ref 6–20)
CO2: 25 mmol/L (ref 22–32)
Calcium: 9.5 mg/dL (ref 8.9–10.3)
Chloride: 95 mmol/L — ABNORMAL LOW (ref 98–111)
Creatinine, Ser: 0.94 mg/dL (ref 0.61–1.24)
GFR calc Af Amer: >60 mL/min (ref >60)
GFR calc non Af Amer: >60 mL/min (ref >60)
Glucose, Bld: 331 mg/dL — ABNORMAL HIGH (ref 70–99)
Potassium: 5.1 mmol/L (ref 3.5–5.1)
Sodium: 132 mmol/L — ABNORMAL LOW (ref 135–145)

## 2019-01-08 LAB — MAGNESIUM: Magnesium: 2 mg/dL (ref 1.7–2.4)

## 2019-01-08 MED ORDER — DEXAMETHASONE 4 MG PO TABS
4.0000 mg | ORAL_TABLET | Freq: Three times a day (TID) | ORAL | Status: DC
  Administered 2019-01-08 – 2019-01-09 (×3): 4 mg via ORAL
  Filled 2019-01-08 (×5): qty 1

## 2019-01-08 MED ORDER — INSULIN ASPART 100 UNIT/ML ~~LOC~~ SOLN
10.0000 [IU] | Freq: Three times a day (TID) | SUBCUTANEOUS | Status: DC
  Administered 2019-01-08 – 2019-01-09 (×3): 10 [IU] via SUBCUTANEOUS
  Filled 2019-01-08 (×2): qty 1

## 2019-01-08 MED ORDER — INSULIN DETEMIR 100 UNIT/ML ~~LOC~~ SOLN
35.0000 [IU] | Freq: Two times a day (BID) | SUBCUTANEOUS | Status: DC
  Administered 2019-01-08 – 2019-01-09 (×2): 35 [IU] via SUBCUTANEOUS
  Filled 2019-01-08 (×3): qty 0.35

## 2019-01-08 NOTE — Progress Notes (Signed)
Occupational Therapy Treatment Patient Details Name: [name] MRN: [id number] DOB: 03-Mar-1961 Today's Date: 01/08/2019    History of present illness presented to ER from home secondary to L UE pain; admitted for management of bilat LE, L UE cellulitis.  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   OT comments  Pt seen for OT tx this date. Pt denies significant complaints, eager to participate. LUE noted to be edematous with +1 pitting edema.. Retrograde massage performed to minimize edema with notable difference. LUE positioned on a pillow on tray table to elevate above heart. Pt appreciative, continues to be very motivated to participate and return to PLOF. Continues to benefit, continue to recommend OP OT.   Follow Up Recommendations  Outpatient OT    Equipment Recommendations  Toilet riser;Other (comment)(reacher)    Recommendations for Other Services      Precautions / Restrictions Precautions Precautions: Fall Precaution Comments: trach Restrictions Weight Bearing Restrictions: No       Mobility Bed Mobility               General bed mobility comments: seated in recliner beginning/end of treatment session  Transfers                      Balance Overall balance assessment: Needs assistance;Modified Independent                                         ADL either performed or assessed with clinical judgement   ADL Overall ADL's : Needs assistance/impaired                                       General ADL Comments: PRN Min A for LB ADL, difficulty/increased effort to perform, Sup to CGA for toilet transfers to comfort height toilet using RW     Vision Baseline Vision/History: Wears glasses Wears Glasses: At all times Patient Visual Report: No change from baseline Vision Assessment?: No apparent visual deficits   Perception     Praxis       Cognition Arousal/Alertness: Awake/alert Behavior During Therapy: WFL for tasks assessed/performed Overall Cognitive Status: Within Functional Limits for tasks assessed                                          Exercises Other Exercises Other Exercises: Retrograde massage performed to LUE with noticeable difference afterward both in edema in the back of the hand and pt's ability to make a fist; positioned up on meal tray   Shoulder Instructions       General Comments LUE edematous and slightly red    Pertinent Vitals/ Pain       Pain Assessment: No/denies pain Pain Intervention(s): Repositioned;Monitored during session  Home Living                                          Prior Functioning/Environment              Frequency  Min 1X/week  Progress Toward Goals  OT Goals(current goals can now be found in the care plan section)  Progress towards OT goals: Progressing toward goals  Acute Rehab OT Goals Patient Stated Goal: to return home when ready, to make L UE feel better OT Goal Formulation: With patient Time For Goal Achievement: 01/18/19 Potential to Achieve Goals: Good  Plan Discharge plan remains appropriate;Frequency remains appropriate    Co-evaluation                 AM-PAC OT "6 Clicks" Daily Activity     Outcome Measure   Help from another person eating meals?: None Help from another person taking care of personal grooming?: None Help from another person toileting, which includes using toliet, bedpan, or urinal?: A Little Help from another person bathing (including washing, rinsing, drying)?: A Little Help from another person to put on and taking off regular upper body clothing?: None Help from another person to put on and taking off regular lower body clothing?: A Little 6 Click Score: 21    End of Session    OT Visit Diagnosis: Other abnormalities of gait and mobility (R26.89);History of  falling (Z91.81);Muscle weakness (generalized) (M62.81) Pain - Right/Left: Left Pain - part of body: Arm   Activity Tolerance Patient tolerated treatment well   Patient Left in chair;with call bell/phone within reach   Nurse Communication        Charges: OT General Charges $OT Visit: 1 Visit OT Treatments $Therapeutic Activity: 23-37 mins  Jeni Salles, MPH, MS, OTR/L ascom [phone] 01/08/19, 1:23 PM

## 2019-01-08 NOTE — Progress Notes (Signed)
Pt off the vent , tolerating well.

## 2019-01-08 NOTE — Progress Notes (Addendum)
Name: [name] MRN: [id number] DOB: [birth year]     CONSULTATION DATE: 01/01/2019  CHIEF COMPLAINT: LUE cellutitis with pain and swelling.   HISTORY OF PRESENT ILLNESS: 58 year old Caucasian male with history of CHF, DM, peripheral neuropathy, OSA/OHS, hx of PE and MI seen in ICU for management of cellulitis. Patient presented to the ED with 1 day history of LUE redness and swelling, and was started on IV antibiotics. He has been treated in the ICU due to his need for nocturnal ventilation. He has a tracheostomy due to history ARDS.   EVENTS OVERNIGHT Today the patient notes continued LUE pain at the site of his cellulitis. Otherwise he is doing well. He denies fevers, chills, chest pain, shortness of breath, extension of the redness/swelling of his upper arm/lower legs.    SIGNIFICANT EVENTS:  01/01/2019: ED visit for LUE and R leg cellulitis, with fever, redness and swelling. ICU admission due to nocturnal ventilation. Started on vancomycin and ciprofloxacin.  01/02/2019: Continued IV abx and steroids. Patient remained in ICU. 01/06/2019: Continued improvement in swelling and erythema of RUE.  01/07/2019: CT upper extremity to rule out more invasive infection, given worsening pain.  01/08/2019: No evidence of abscess/facitis on CT. Transition to PO antibiotics/steroids.   PAST MEDICAL HISTORY :   has a past medical history of CHF (congestive heart failure) ([place]), Diabetes mellitus ([place]), Diabetic peripheral neuropathy ([place]), Elevated liver enzymes, GERD (gastroesophageal reflux disease), History of pulmonary embolus (PE), Hyperlipidemia, Hypertension, Myocardial infarct ([place]), OSA (obstructive sleep apnea), Pancreatitis, and Renal insufficiency.  has a past surgical history that includes Vasectomy; Tracheostomy (2007); Cardiac surgery; Flexible bronchoscopy (N/A, 07/28/2017); LEFT HEART CATH AND CORONARY ANGIOGRAPHY (N/A, 09/01/2017); and pace maker (2019). Prior to Admission medications    Medication Sig Start Date End Date Taking? Authorizing Provider  acetaminophen (TYLENOL) 325 MG tablet Take 1 tablet (325 mg total) by mouth every 6 (six) hours as needed for mild pain (or Fever >/= 101). 09/02/17  Yes Gouru, Illene Silver, MD  ALPRAZolam Duanne Moron) 0.5 MG tablet Take 1 tablet (0.5 mg total) by mouth every 8 (eight) hours as needed for anxiety. 09/02/17  Yes Gouru, Illene Silver, MD  AMBULATORY NON FORMULARY MEDICATION Medication Name: Trachea collar for use with nebulizer DX: J96.10 12/09/17  Yes [name], MD  AMBULATORY NON FORMULARY MEDICATION Medication Name: Nebulizer DX: J96.10 12/09/17  Yes [name], MD  amiodarone (PACERONE) 200 MG tablet Take 200 mg by mouth daily.    Yes [provider]  atorvastatin (LIPITOR) 80 MG tablet Take 80 mg by mouth at bedtime. 08/23/17  Yes [provider]  busPIRone (BUSPAR) 10 MG tablet Take 10 mg by mouth 2 (two) times daily as needed (anxiety).    Yes [provider]  folic acid (FOLVITE) 1 MG tablet Take 1 tablet (1 mg total) by mouth daily. 09/03/17  Yes Gouru, Illene Silver, MD  furosemide (LASIX) 80 MG tablet Take 80 mg by mouth 2 (two) times daily.   Yes [provider]  gabapentin (NEURONTIN) 800 MG tablet Take 1 tablet (800 mg total) by mouth 3 (three) times daily. 1 tablet twice daily Patient taking differently: Take 1,600 mg by mouth 2 (two) times daily.  09/02/17  Yes Gouru, Illene Silver, MD  lamoTRIgine (LAMICTAL) 150 MG tablet Take 150 mg by mouth 2 (two) times daily.  07/02/17  Yes [provider]  metFORMIN (GLUCOPHAGE-XR) 500 MG 24 hr tablet Take 1,000 mg by mouth 2 (two) times daily.  01/24/14  Yes [provider]  metoprolol tartrate (LOPRESSOR) 100 MG tablet Take 100 mg by mouth 2 (two) times daily.   Yes [provider]  Multiple Vitamin (MULTIVITAMIN WITH MINERALS) TABS tablet Take 1 tablet by mouth daily. 09/03/17  Yes Gouru, Illene Silver, MD  omeprazole (PRILOSEC) 20 MG capsule Take  20 mg by mouth daily.    Yes [provider]  oxyCODONE-acetaminophen (PERCOCET) 10-325 MG tablet Take 1 tablet by mouth 5 (five) times daily as needed for pain.    Yes [provider]  rivaroxaban (XARELTO) 20 MG TABS tablet Take 20 mg by mouth daily.   Yes [provider]  budesonide (PULMICORT) 0.5 MG/2ML nebulizer solution Take 2 mLs (0.5 mg total) by nebulization 2 (two) times daily. Patient not taking: Reported on 01/01/2019 02/27/18   [name], MD  collagenase (SANTYL) ointment Apply topically daily. Patient not taking: Reported on 01/01/2019 07/20/17   Dustin Flock, MD  enalapril (VASOTEC) 2.5 MG tablet Take 1 tablet (2.5 mg total) by mouth daily. Patient not taking: Reported on 01/01/2019 10/09/17   Hillary Bow, MD  ipratropium-albuterol (DUONEB) 0.5-2.5 (3) MG/3ML SOLN Take 3 mLs by nebulization 3 (three) times daily. Patient not taking: Reported on 01/01/2019 12/09/17   [name], MD  metoprolol tartrate 75 MG TABS Take 75 mg by mouth 2 (two) times daily. 100 mg twice daily Patient not taking: Reported on 01/01/2019 10/09/17   Hillary Bow, MD  polyethylene glycol (MIRALAX / GLYCOLAX) packet Take 17 g by mouth daily as needed for mild constipation. Patient not taking: Reported on 01/01/2019 09/02/17   Nicholes Mango, MD  senna-docusate (SENOKOT-S) 8.6-50 MG tablet Take 2 tablets by mouth at bedtime as needed for mild constipation. Patient not taking: Reported on 01/01/2019 09/02/17   Nicholes Mango, MD  thiamine 100 MG tablet Take 1 tablet (100 mg total) by mouth daily. Patient not taking: Reported on 01/01/2019 09/03/17   Nicholes Mango, MD   Allergies  Allergen Reactions  . Cephalexin     Other reaction(s): Unknown  . Hctz  [Hydrochlorothiazide]     Other reaction(s): Other (See Comments) hyponatremia  . Hydralazine   . Penicillins Hives    Has patient had a PCN reaction causing immediate rash, facial/tongue/throat swelling, SOB or  lightheadedness with hypotension: Yes Has patient had a PCN reaction causing severe rash involving mucus membranes or skin necrosis: Yes Has patient had a PCN reaction that required hospitalization: Unknown Has patient had a PCN reaction occurring within the last 10 years: No If all of the above answers are "NO", then may proceed with Cephalosporin use.   Marland Kitchen Reserpine   . Zaroxolyn [Metolazone]     Other reaction(s): Unknown    REVIEW OF SYSTEMS:    Gen:  Denies  fever, sweats, chills,  weight loss, anorexia. Cardiac:  No dizziness, chest pain or heaviness, chest tightness, No JVD Resp:  Has trach. No cough, - sputum production, - shortness of breath,- wheezing, - hemoptysis,  Gi: Denies stomach pain, nausea or vomiting, diarrhea, constipation, bowel incontinence Gu:  Denies bladder incontinence, burning urine Ext:  Endorses pain and edema of LUE to the elbow, and right and left lower extremity edema and redness. Endorses pain with ROM of left shoulder, elbow, wrist and hand. Denies other sites of joint pain, stiffness or swelling.  Skin: Endorses LUE and bilateral LE redness, swelling and edema. Endorses chronic skin lesions on bilateral shins. Denies skin rash, easy bruising or bleeding or hives. Endoc:  Endorses heat intolerance. Denies  polyuria, polydipsia , polyphagia or weight change Other:  All other systems negative.   VITAL SIGNS: Temp:  [97.6 F (36.4 C)-98.7 F (37.1 C)] 97.6 F (36.4 C) (07/17 0815) Pulse Rate:  [51-71] 51 (07/17 0200) Resp:  [12-17] 12 (07/17 0200) BP: (131-139)/(74-90) 133/90 (07/17 0200) SpO2:  [99 %-100 %] 100 % (07/17 0431) FiO2 (%):  [40 %] 40 % (07/17 0431)   I/O last 3 completed shifts: In: 1450 [P.O.:1450] Out: 3600 [Urine:3600] Total I/O In: -  Out: 250 [Urine:250]   SpO2: 100 % O2 Flow Rate (L/min): 40 L/min FiO2 (%): 40 %  Physical Examination:  GENERAL: Comfortably resting in chair. No acute distress. Has tracheostomy in place.   HEAD: Normocephalic, atraumatic.  EYES: Pupils equal, round, reactive to light.  No scleral icterus. Wears glasses.  MOUTH: Moist mucosal membrane.  NECK: Supple. No JVD.  PULMONARY: Clear to auscultation bilaterally.  CARDIOVASCULAR: S1 and S2. Regular rate and rhythm. No murmurs, rubs, or gallops. Radial and DP pulses 1+ bilaterally.  GASTROINTESTINAL: Soft, nontender, - distended. No masses. Positive bowel sounds. No hepatosplenomegaly.   MUSCULOSKELETAL: ROM of left upper extremity (elbow, wrist, fingers) limited due to pain. Limited movement toes bilaterally. Strength grossly intact.  NEUROLOGIC: Alert and oriented x 3. Peripheral neuropathy bilaterally of upper extremities to elbow. Peripheral neuropathy bilaterally of lower extremities to right below the knee. Otherwise sensory grossly intact SKIN:  Left upper extremity erythema and edema with warmth, starting at elbow to fingers. +Tenderness, most pronounced at elbow. No crepitus or fluctuance.   Chronic venous stasis ulcers bilateral shins, with drainage. 2+ edema to knee bilaterally. No fluctuance or crepitance. Lesion with serous drainage on right medial maleolus, at location where his slippers contact his skin. No evidence of diabetic ulcers on feet, + calluses.  I personally reviewed lab work that was obtained in last 24 hrs.  CT OF THE UPPER LEFT EXTREMITY WITH CONTRAST 01/03/2019:  1. Diffuse edema/cellulitis extending to of the entirety of the left arm and forearm, predominantly along the posterior aspect of the arm through the elbow. There is no evidence of an abscess. There are no CT findings to suggest fasciitis.  2. No other abnormalities. No evidence of osteomyelitis. No bone or joint abnormality.  MEDICATIONS: I have reviewed all medications and confirmed regimen as documented   CULTURE RESULTS   Recent Results (from the past 240 hour(s))  SARS Coronavirus 2 (CEPHEID - Performed in [place] hospital lab), Hosp  Order     Status: None   Collection Time: 01/01/19  5:03 PM   Specimen: Nasopharyngeal Swab  Result Value Ref Range Status   SARS Coronavirus 2 NEGATIVE NEGATIVE Final    Comment: (NOTE) If result is NEGATIVE SARS-CoV-2 target nucleic acids are NOT DETECTED. The SARS-CoV-2 RNA is generally detectable in upper and lower  respiratory specimens during the acute phase of infection. The lowest  concentration of SARS-CoV-2 viral copies this assay can detect is 250  copies / mL. A negative result does not preclude SARS-CoV-2 infection  and should not be used as the sole basis for treatment or other  patient management decisions.  A negative result may occur with  improper specimen collection / handling, submission of specimen other  than nasopharyngeal swab, presence of viral mutation(s) within the  areas targeted by this assay, and inadequate number of viral copies  (<250 copies / mL). A negative result must be combined with clinical  observations, patient history, and epidemiological information. If result is  POSITIVE SARS-CoV-2 target nucleic acids are DETECTED. The SARS-CoV-2 RNA is generally detectable in upper and lower  respiratory specimens dur ing the acute phase of infection.  Positive  results are indicative of active infection with SARS-CoV-2.  Clinical  correlation with patient history and other diagnostic information is  necessary to determine patient infection status.  Positive results do  not rule out bacterial infection or co-infection with other viruses. If result is PRESUMPTIVE POSTIVE SARS-CoV-2 nucleic acids MAY BE PRESENT.   A presumptive positive result was obtained on the submitted specimen  and confirmed on repeat testing.  While 2019 novel coronavirus  (SARS-CoV-2) nucleic acids may be present in the submitted sample  additional confirmatory testing may be necessary for epidemiological  and / or clinical management purposes  to differentiate between  SARS-CoV-2  and other Sarbecovirus currently known to infect humans.  If clinically indicated additional testing with an alternate test  methodology [phone]) is advised. The SARS-CoV-2 RNA is generally  detectable in upper and lower respiratory sp ecimens during the acute  phase of infection. The expected result is Negative. Fact Sheet for Patients:  StrictlyIdeas.no Fact Sheet for Healthcare Providers: BankingDealers.co.za This test is not yet approved or cleared by the [place] FDA and has been authorized for detection and/or diagnosis of SARS-CoV-2 by FDA under an Emergency Use Authorization (EUA).  This EUA will remain in effect (meaning this test can be used) for the duration of the COVID-19 declaration under Section 564(b)(1) of the Act, 21 U.S.C. section 360bbb-3(b)(1), unless the authorization is terminated or revoked sooner. Performed at [facility], [address] [place], [place] [address]   MRSA PCR Screening     Status: None   Collection Time: 01/01/19  8:41 PM   Specimen: Nasal Mucosa; Nasopharyngeal  Result Value Ref Range Status   MRSA by PCR NEGATIVE NEGATIVE Final    Comment:        The GeneXpert MRSA Assay (FDA approved for NASAL specimens only), is one component of a comprehensive MRSA colonization surveillance program. It is not intended to diagnose MRSA infection nor to guide or monitor treatment for MRSA infections. Performed at [facility], [address] [place], [place] [address]          ASSESSMENT AND PLAN SYNOPSIS: 58 year old Caucasian male admitted with severe sepsis secondary to multi-extremity cellulitis, improving with antibiotics and corticosteroids. No evidence of abscess, fascitis or osteomyelitis on CT UE.  CELLULITIS - Improving - Patient notes continued pain of LUE that is steadily improving.  -No evidence of more invasive infection on CT.  - Continue IV morphine with  plans to transition to PO percocet before discharge.  - Continue PO antibiotics (Cipro and Bactrim), and will start PO dexamethasone today. - Rx for diabetic footwear provided.   CARDIAC - Afib well controlled on Amiodarone.   GI GI PROPHYLAXIS as indicated  NUTRITIONAL STATUS DIET--> Regular Constipation protocol as indicated  ENDO - will use ICU hypoglycemic\Hyperglycemia protocol if needed  ELECTROLYTES -follow labs as needed -replace as needed -pharmacy consultation and following   DVT/GI PRX ordered TRANSFUSIONS AS NEEDED MONITOR FSBS ASSESS the need for LABS   Disposition: Will discharge home with PO antibiotics and steroids. Patient has nocturnal ventilation capabilities at home. Plan for PT outpatient services and wound care outpatient.   Corrin Parker, M.D.  [facility]  Medical Director [place] Director [facility] Cardio-Pulmonary Department

## 2019-01-08 NOTE — Progress Notes (Signed)
Inpatient Diabetes Program Recommendations  AACE/ADA: New Consensus Statement on Inpatient Glycemic Control (2015)  Target Ranges:  Prepandial:   less than 140 mg/dL      Peak postprandial:   less than 180 mg/dL (1-2 hours)      Critically ill patients:  140 - 180 mg/dL   Results for [name], [name] (MRN [id number]) as of 01/08/2019 12:21  Ref. Range 01/08/2019 07:31 01/08/2019 11:44  Glucose-Capillary Latest Ref Range: 70 - 99 mg/dL 300 (H)  13 units NOVOLOG +  33 units LEVEMIR 335 (H)    Home DM Meds: Levemir 20 units BID       Humalog 2-10 units TID       Metformin 1000 mg BID  Current Orders: Levemir 33 units BID       Novolog Sensitive Correction Scale/ SSI (0-9 units) TID AC + HS      Novolog 8 units TID with meals      Metformin 1000 mg BID       Getting Decadron 4 mg Q8H.  CBGs remain elevated despite insulin adjustments made yesterday (Levemr increased to 33 units BID yest and Novolog meal coverage increased yesterday at 12pm).    MD- Please consider the following in-hospital insulin adjustments while patient remains on Decadron and CBGs are elevated >300 mg/dl:  1. Increase Levemir further to 35 units BID  2. Increase Novolog Meal Coverage to: Novolog 10 units TID with meals   Insulins will likely need downward adjustments as steroids are decreased.    --Will follow patient during hospitalization--  Wyn Quaker RN, MSN, CDE Diabetes Coordinator Inpatient Glycemic Control Team Team Pager: [phone] (8a-5p)

## 2019-01-08 NOTE — Consult Note (Signed)
PHARMACY CONSULT NOTE - FOLLOW UP  Pharmacy Consult for Electrolyte Monitoring and Replacement   Recent Labs: Potassium (mmol/L)  Date Value  01/08/2019 5.1  07/29/2014 4.4   Magnesium (mg/dL)  Date Value  01/08/2019 2.0   Calcium (mg/dL)  Date Value  01/08/2019 9.5   Calcium, Total (mg/dL)  Date Value  07/29/2014 9.1   Albumin (g/dL)  Date Value  01/01/2019 3.5  08/14/2013 3.6   Phosphorus (mg/dL)  Date Value  01/03/2019 2.9   Sodium (mmol/L)  Date Value  01/08/2019 132 (L)  07/29/2014 130 (L)   Corrected Ca: 9.9 mg/dL  Assessment: 58 y.o. male admitted on 01/01/2019 with left elbow and RLE cellulitis.   Goal of Therapy:  Potassium > 4 mmol/L Magnesium > 2 mg/dL Phosphorous > 3.0 mg/dL  Plan:   Na noted to be borderline low but stable. Chart review shows he does run low at baseline. He is on lamotrigine and amiodarone PTA, which may contribute to this  Potassium continues to trend upward, although no supplementation has been given since admission: f/u in am  Other electrolytes are at goal, although last phosphorous was borderline on 7/12: f/u in am  No electrolyte replacement warranted at this time  Repeat electrolytes labs in am and replace if needed  Dallie Piles ,PharmD Clinical Pharmacist 01/08/2019 2:17 PM

## 2019-01-08 NOTE — Progress Notes (Signed)
[place] [facility] [place] NAME: [name]    MR#:  [id number]  DATE OF BIRTH:  [birth year]  SUBJECTIVE:  CHIEF COMPLAINT:  No chief complaint on file.  Swelling in the arm and elbow is improved  REVIEW OF SYSTEMS:  CONSTITUTIONAL: No fever, fatigue or weakness.  EYES: No blurred or double vision.  EARS, NOSE, AND THROAT: No tinnitus or ear pain.  RESPIRATORY: No cough, shortness of breath, wheezing or hemoptysis.  CARDIOVASCULAR: No chest pain, orthopnea, edema.  GASTROINTESTINAL: No nausea, vomiting, diarrhea or abdominal pain.  GENITOURINARY: No dysuria, hematuria.  ENDOCRINE: No polyuria, nocturia,  HEMATOLOGY: No anemia, easy bruising or bleeding SKIN: No rash or lesion. MUSCULOSKELETAL: No joint pain or arthritis.   NEUROLOGIC: No tingling, numbness, weakness.  PSYCHIATRY: No anxiety or depression.   ROS  DRUG ALLERGIES:   Allergies  Allergen Reactions  . Cephalexin     Other reaction(s): Unknown  . Hctz  [Hydrochlorothiazide]     Other reaction(s): Other (See Comments) hyponatremia  . Hydralazine   . Penicillins Hives    Has patient had a PCN reaction causing immediate rash, facial/tongue/throat swelling, SOB or lightheadedness with hypotension: Yes Has patient had a PCN reaction causing severe rash involving mucus membranes or skin necrosis: Yes Has patient had a PCN reaction that required hospitalization: Unknown Has patient had a PCN reaction occurring within the last 10 years: No If all of the above answers are "NO", then may proceed with Cephalosporin use.   Marland Kitchen Reserpine   . Zaroxolyn [Metolazone]     Other reaction(s): Unknown    VITALS:  Blood pressure 133/90, pulse (!) 51, temperature 97.6 F (36.4 C), temperature source Oral, resp. rate 12, height 6' 0.01" (1.829 m), weight (!) 136.8 kg, SpO2 100 %.  PHYSICAL EXAMINATION:  GENERAL:  58 y.o.-year-old morbidly obese patient lying in the bed with no acute distress.   EYES: Pupils equal, round, reactive to light and accommodation. No scleral icterus. Extraocular muscles intact.  HEENT: Head atraumatic, normocephalic. Oropharynx and nasopharynx clear.  NECK:  Supple, no jugular venous distention. No thyroid enlargement, no tenderness.  Tracheostomy in place. LUNGS: Normal breath sounds bilaterally, no wheezing, rales,rhonchi or crepitation. No use of accessory muscles of respiration.  CARDIOVASCULAR: S1, S2 normal. No murmurs, rubs, or gallops.  ABDOMEN: Soft, nontender, nondistended. Bowel sounds present. No organomegaly or mass.  EXTREMITIES: Bilateral some pedal edema, cyanosis, or clubbing.  Both legs and left upper extremity has swelling that is improved able to move fingers on left hand. NEUROLOGIC: Cranial nerves II through XII are intact. Muscle strength 5/5 in all extremities. Sensation intact. Gait not checked.  PSYCHIATRIC: [name].  SKIN: No obvious rash, lesion, or ulcer.         Physical Exam LABORATORY PANEL:   CBC Recent Labs  Lab 01/08/19 0441  WBC 10.6*  HGB 12.7*  HCT 39.4  PLT 397   ------------------------------------------------------------------------------------------------------------------  Chemistries  Recent Labs  Lab 01/01/19 1401  01/08/19 0441  NA 131*   < > 132*  K 3.8   < > 5.1  CL 91*   < > 95*  CO2 27   < > 25  GLUCOSE 214*   < > 331*  BUN 22*   < > 35*  CREATININE 1.13   < > 0.94  CALCIUM 8.7*   < > 9.5  MG  --    < > 2.0  AST  33  --   --   ALT 28  --   --   ALKPHOS 79  --   --   BILITOT 0.8  --   --    < > = values in this interval not displayed.   ------------------------------------------------------------------------------------------------------------------  Cardiac Enzymes No results for input(s): TROPONINI in the last 168  hours. ------------------------------------------------------------------------------------------------------------------  RADIOLOGY:  No results found.  ASSESSMENT AND PLAN:   Active Problems:   Cellulitis  1.  Cellulitis left arm and right lower extremity.  Patient has allergy to penicillin and Keflex.  Continue Vancomycin and Cipro ordered.  Possible discharge home on oral antibiotics As he has persistent swelling and complaint of pain get CT scan of upper extremity to rule out deep infections. CT scan was negative for any drainable abscess or fasciitis. A continue steroids  2.  Morbid obesity with sleep apnea.  Patient states that he sleeps with a ventilator at home. Monitor in CCU. 3.  Morbid obesity with a BMI of 40.69.  Weight loss needed. 4.  Type 2 diabetes mellitus put on sliding scale insulin and Lantus. 5.  Chronic diastolic congestive heart failure and edema.  Continue Lasix  6.  History of CAD and paroxysmal atrial fibrillation.    Continue amiodarone and metoprolol and Lipitor  7.    Alcohol use recommended to decrease alcohol intake 8.  Diabetic neuropathy continue gabapentin  CODE STATUS: Full.  TOTAL TIME TAKING CARE OF THIS PATIENT: 35 minutes.    POSSIBLE D/C today   Auburn BilberryShreyang Sherlyne Crownover M.D on 01/08/2019   Between 7am to 6pm - Pager - [phone]  After 6pm go to [email or web address] - password [email or web address]  [facility] [place]  Office  [phone]  CC: Primary care physician; Hillery AldoPatel, Sarah, MD  Note: This dictation was prepared with Dragon dictation along with smaller phrase technology. Any transcriptional errors that result from this process are unintentional.

## 2019-01-09 LAB — CBC
HCT: 38.5 % — ABNORMAL LOW (ref 39.0–52.0)
Hemoglobin: 12.8 g/dL — ABNORMAL LOW (ref 13.0–17.0)
MCH: 28.8 pg (ref 26.0–34.0)
MCHC: 33.2 g/dL (ref 30.0–36.0)
MCV: 86.7 fL (ref 80.0–100.0)
Platelets: 391 10*3/uL (ref 150–400)
RBC: 4.44 MIL/uL (ref 4.22–5.81)
RDW: 13.1 % (ref 11.5–15.5)
WBC: 11.9 10*3/uL — ABNORMAL HIGH (ref 4.0–10.5)
nRBC: 0 % (ref 0.0–0.2)

## 2019-01-09 LAB — RENAL FUNCTION PANEL
Albumin: 3.3 g/dL — ABNORMAL LOW (ref 3.5–5.0)
Anion gap: 10 (ref 5–15)
BUN: 39 mg/dL — ABNORMAL HIGH (ref 6–20)
CO2: 28 mmol/L (ref 22–32)
Calcium: 9.8 mg/dL (ref 8.9–10.3)
Chloride: 93 mmol/L — ABNORMAL LOW (ref 98–111)
Creatinine, Ser: 0.98 mg/dL (ref 0.61–1.24)
GFR calc Af Amer: >60 mL/min (ref >60)
GFR calc non Af Amer: >60 mL/min (ref >60)
Glucose, Bld: 301 mg/dL — ABNORMAL HIGH (ref 70–99)
Phosphorus: 2.8 mg/dL (ref 2.5–4.6)
Potassium: 5 mmol/L (ref 3.5–5.1)
Sodium: 131 mmol/L — ABNORMAL LOW (ref 135–145)

## 2019-01-09 LAB — GLUCOSE, CAPILLARY
Glucose-Capillary: 276 mg/dL — ABNORMAL HIGH (ref 70–99)
Glucose-Capillary: 376 mg/dL — ABNORMAL HIGH (ref 70–99)

## 2019-01-09 LAB — PROCALCITONIN: Procalcitonin: <0.1 ng/mL

## 2019-01-09 LAB — MAGNESIUM: Magnesium: 1.9 mg/dL (ref 1.7–2.4)

## 2019-01-09 MED ORDER — OXYCODONE-ACETAMINOPHEN 10-325 MG PO TABS: 1.0000 | ORAL_TABLET | Freq: Four times a day (QID) | ORAL | 0 refills | Status: DC | PRN

## 2019-01-09 MED ORDER — METOPROLOL TARTRATE 50 MG PO TABS: 50.0000 mg | ORAL_TABLET | Freq: Two times a day (BID) | ORAL | Status: AC

## 2019-01-09 MED ORDER — SULFAMETHOXAZOLE-TRIMETHOPRIM 800-160 MG PO TABS: 1.0000 | ORAL_TABLET | Freq: Two times a day (BID) | ORAL | 0 refills | Status: AC

## 2019-01-09 MED ORDER — CIPROFLOXACIN HCL 500 MG PO TABS: 500.0000 mg | ORAL_TABLET | Freq: Two times a day (BID) | ORAL | 0 refills | Status: AC

## 2019-01-09 MED ORDER — OXYCODONE-ACETAMINOPHEN 10-325 MG PO TABS: 1.0000 | ORAL_TABLET | Freq: Four times a day (QID) | ORAL | 0 refills | Status: AC | PRN

## 2019-01-09 MED ORDER — INSULIN DETEMIR 100 UNIT/ML FLEXPEN: 20.0000 [IU] | Freq: Two times a day (BID) | SUBCUTANEOUS | 0 refills | Status: AC

## 2019-01-09 MED ORDER — SENNOSIDES-DOCUSATE SODIUM 8.6-50 MG PO TABS: 2.0000 | ORAL_TABLET | Freq: Every evening | ORAL | Status: AC | PRN

## 2019-01-09 NOTE — Progress Notes (Signed)
Patient states feeling much better VS stable Swelling persistent but improved with time WBC 11.9, Afebrile  Patient states that he is comfortable going home Patient switched to oral abx and tolerated well I advised cold compresses at home for left arm  COVID-19 EDUCATION: The signs and symptoms of COVID-19 were discussed with the patient and how to seek care for testing (follow up with PCP or arrange E-visit).  The importance of social distancing was discussed today.   CURRENT MEDICATIONS REVIEWED AT LENGTH WITH PATIENT TODAY   Patient are satisfied with Plan of action and management. All questions answered  Follow up with PCP and Dr Ulyess Blossom Patricia Pesa, M.D.  [facility]  Medical Director [place] Director [facility] Cardio-Pulmonary Department

## 2019-01-09 NOTE — Progress Notes (Signed)
Pharmacy Electrolyte Monitoring Consult:  Pharmacy consulted to assist in monitoring and replacing electrolytes in this 58 y.o. male admitted on 01/01/2019 with cellulitis.   Labs:  Sodium (mmol/[name])  Date Value  01/09/2019 131 ([name])  07/29/2014 130 ([name])   Potassium (mmol/[name])  Date Value  01/09/2019 5.0  07/29/2014 4.4   Magnesium (mg/dL)  Date Value  01/09/2019 1.9   Phosphorus (mg/dL)  Date Value  01/09/2019 2.8   Calcium (mg/dL)  Date Value  01/09/2019 9.8   Calcium, Total (mg/dL)  Date Value  07/29/2014 9.1   Albumin (g/dL)  Date Value  01/09/2019 3.3 ([name])  08/14/2013 3.6    Assessment/Plan: Patient currently ordered furosemide 40mg  daily. Patient on enalapril 2.5mg  daily and Bactrim DS which may be contributing to potassium value.   Will defer replacement at this time as electrolytes are at or near goal. Will order BMP with am labs.   Will replace to potassium ~ 4, magnesium ~ 2, and phosphorus ~ 3.   Pharmacy will continue to monitor and adjust per consult.   [name],[name] 01/09/2019 7:24 AM

## 2019-01-09 NOTE — Discharge Summary (Signed)
Name: [name] MRN: [id number] DOB: 04-30-1961     CONSULTATION DATE: 01/01/2019  CHIEF COMPLAINT: LUE cellutitis with pain and swelling.   HISTORY OF PRESENT ILLNESS: 58 year old Caucasian male with history of CHF, DM, peripheral neuropathy, OSA/OHS, hx of PE and MI seen in ICU for management of cellulitis. Patient presented to the ED with 1 day history of LUE redness and swelling, and was started on IV antibiotics. He has been treated in the ICU due to his need for nocturnal ventilation. He has a tracheostomy due to history ARDS.   Patient had LET [facility] IV infiltrated--which caused swelling and pain Arms swelling much improved  EVENTS OVERNIGHT Today the patient notes continued LUE pain at the site of his cellulitis. Otherwise he is doing well. He denies fevers, chills, chest pain, shortness of breath, extension of the redness/swelling of his upper arm/lower legs.    SIGNIFICANT EVENTS:  01/01/2019: ED visit for LUE and R leg cellulitis, with fever, redness and swelling. ICU admission due to nocturnal ventilation. Started on vancomycin and ciprofloxacin.  01/02/2019: Continued IV abx and steroids. Patient remained in ICU. 01/06/2019: Continued improvement in swelling and erythema of RUE.  01/07/2019: CT upper extremity to rule out more invasive infection, given worsening pain.  01/08/2019: No evidence of abscess/facitis on CT. Transition to PO antibiotics/steroids.   PAST MEDICAL HISTORY :   has a past medical history of CHF (congestive heart failure) (Robinson), Diabetes mellitus ([name]), Diabetic peripheral neuropathy ([name]), Elevated liver enzymes, GERD (gastroesophageal reflux disease), History of pulmonary embolus (PE), Hyperlipidemia, Hypertension, Myocardial infarct ([name]), OSA (obstructive sleep apnea), Pancreatitis, and Renal insufficiency.  has a past surgical history that includes Vasectomy; Tracheostomy (2007); Cardiac surgery; Flexible bronchoscopy (N/A, 07/28/2017); LEFT HEART CATH AND  CORONARY ANGIOGRAPHY (N/A, 09/01/2017); and pace maker (2019). Prior to Admission medications   Medication Sig Start Date End Date Taking? Authorizing Provider  acetaminophen (TYLENOL) 325 MG tablet Take 1 tablet (325 mg total) by mouth every 6 (six) hours as needed for mild pain (or Fever >/= 101). 09/02/17  Yes Gouru, Illene Silver, MD  ALPRAZolam Duanne Moron) 0.5 MG tablet Take 1 tablet (0.5 mg total) by mouth every 8 (eight) hours as needed for anxiety. 09/02/17  Yes Gouru, Illene Silver, MD  AMBULATORY NON FORMULARY MEDICATION Medication Name: Trachea collar for use with nebulizer DX: J96.10 12/09/17  Yes [name], MD  AMBULATORY NON FORMULARY MEDICATION Medication Name: Nebulizer DX: J96.10 12/09/17  Yes [name], MD  amiodarone (PACERONE) 200 MG tablet Take 200 mg by mouth daily.    Yes [provider]  atorvastatin (LIPITOR) 80 MG tablet Take 80 mg by mouth at bedtime. 08/23/17  Yes [provider]  busPIRone (BUSPAR) 10 MG tablet Take 10 mg by mouth 2 (two) times daily as needed (anxiety).    Yes [provider]  folic acid (FOLVITE) 1 MG tablet Take 1 tablet (1 mg total) by mouth daily. 09/03/17  Yes Gouru, Illene Silver, MD  furosemide (LASIX) 80 MG tablet Take 80 mg by mouth 2 (two) times daily.   Yes [provider]  gabapentin (NEURONTIN) 800 MG tablet Take 1 tablet (800 mg total) by mouth 3 (three) times daily. 1 tablet twice daily Patient taking differently: Take 1,600 mg by mouth 2 (two) times daily.  09/02/17  Yes Gouru, Illene Silver, MD  lamoTRIgine (LAMICTAL) 150 MG tablet Take 150 mg by mouth 2 (two) times daily.  07/02/17  Yes [provider]  metFORMIN (GLUCOPHAGE-XR) 500 MG 24 hr  tablet Take 1,000 mg by mouth 2 (two) times daily.  01/24/14  Yes [provider]  metoprolol tartrate (LOPRESSOR) 100 MG tablet Take 100 mg by mouth 2 (two) times daily.   Yes [provider]  Multiple Vitamin (MULTIVITAMIN WITH MINERALS) TABS tablet Take 1  tablet by mouth daily. 09/03/17  Yes Gouru, Illene Silver, MD  omeprazole (PRILOSEC) 20 MG capsule Take 20 mg by mouth daily.    Yes [provider]  oxyCODONE-acetaminophen (PERCOCET) 10-325 MG tablet Take 1 tablet by mouth 5 (five) times daily as needed for pain.    Yes [provider]  rivaroxaban (XARELTO) 20 MG TABS tablet Take 20 mg by mouth daily.   Yes [provider]  budesonide (PULMICORT) 0.5 MG/2ML nebulizer solution Take 2 mLs (0.5 mg total) by nebulization 2 (two) times daily. Patient not taking: Reported on 01/01/2019 02/27/18   [name], MD  collagenase (SANTYL) ointment Apply topically daily. Patient not taking: Reported on 01/01/2019 07/20/17   Dustin Flock, MD  enalapril (VASOTEC) 2.5 MG tablet Take 1 tablet (2.5 mg total) by mouth daily. Patient not taking: Reported on 01/01/2019 10/09/17   Hillary Bow, MD  ipratropium-albuterol (DUONEB) 0.5-2.5 (3) MG/3ML SOLN Take 3 mLs by nebulization 3 (three) times daily. Patient not taking: Reported on 01/01/2019 12/09/17   [name], MD  metoprolol tartrate 75 MG TABS Take 75 mg by mouth 2 (two) times daily. 100 mg twice daily Patient not taking: Reported on 01/01/2019 10/09/17   Hillary Bow, MD  polyethylene glycol (MIRALAX / GLYCOLAX) packet Take 17 g by mouth daily as needed for mild constipation. Patient not taking: Reported on 01/01/2019 09/02/17   Nicholes Mango, MD  senna-docusate (SENOKOT-S) 8.6-50 MG tablet Take 2 tablets by mouth at bedtime as needed for mild constipation. Patient not taking: Reported on 01/01/2019 09/02/17   Nicholes Mango, MD  thiamine 100 MG tablet Take 1 tablet (100 mg total) by mouth daily. Patient not taking: Reported on 01/01/2019 09/03/17   Nicholes Mango, MD   Allergies  Allergen Reactions  . Cephalexin     Other reaction(s): Unknown  . Hctz  [Hydrochlorothiazide]     Other reaction(s): Other (See Comments) hyponatremia  . Hydralazine   . Penicillins Hives    Has  patient had a PCN reaction causing immediate rash, facial/tongue/throat swelling, SOB or lightheadedness with hypotension: Yes Has patient had a PCN reaction causing severe rash involving mucus membranes or skin necrosis: Yes Has patient had a PCN reaction that required hospitalization: Unknown Has patient had a PCN reaction occurring within the last 10 years: No If all of the above answers are "NO", then may proceed with Cephalosporin use.   Marland Kitchen Reserpine   . Zaroxolyn [Metolazone]     Other reaction(s): Unknown    REVIEW OF SYSTEMS:    Gen:  Denies  fever, sweats, chills,  weight loss, anorexia. Cardiac:  No dizziness, chest pain or heaviness, chest tightness, No JVD Resp:  Has trach. No cough, - sputum production, - shortness of breath,- wheezing, - hemoptysis,  Gi: Denies stomach pain, nausea or vomiting, diarrhea, constipation, bowel incontinence Gu:  Denies bladder incontinence, burning urine Ext:  Endorses pain and edema of LUE to the elbow, and right and left lower extremity edema and redness. Endorses pain with ROM of left shoulder, elbow, wrist and hand. Denies other sites of joint pain, stiffness or swelling.  Skin: Endorses LUE and bilateral LE redness, swelling and edema. Endorses chronic skin lesions on bilateral shins.  Denies skin rash, easy bruising or bleeding or hives. Endoc:  Endorses heat intolerance. Denies polyuria, polydipsia , polyphagia or weight change Other:  All other systems negative.   VITAL SIGNS: Temp:  [97.5 F (36.4 C)-97.6 F (36.4 C)] 97.5 F (36.4 C) (07/17 2047) Pulse Rate:  [88-100] 88 (07/18 0000) Resp:  [14-18] 18 (07/18 0000) BP: (128-140)/(75-78) 140/78 (07/17 2251) SpO2:  [100 %] 100 % (07/18 0400) FiO2 (%):  [40 %-100 %] 40 % (07/18 0400)   I/O last 3 completed shifts: In: -  Out: 2850 [Urine:2850] No intake/output data recorded.   SpO2: 100 % O2 Flow Rate (L/min): 40 L/min FiO2 (%): 40 %  Physical Examination:  GENERAL:  Comfortably resting in chair. No acute distress. Has tracheostomy in place.  HEAD: Normocephalic, atraumatic.  EYES: Pupils equal, round, reactive to light.  No scleral icterus. Wears glasses.  MOUTH: Moist mucosal membrane.  NECK: Supple. No JVD.  PULMONARY: Clear to auscultation bilaterally.  CARDIOVASCULAR: S1 and S2. Regular rate and rhythm. No murmurs, rubs, or gallops. Radial and DP pulses 1+ bilaterally.  GASTROINTESTINAL: Soft, nontender, - distended. No masses. Positive bowel sounds. No hepatosplenomegaly.   MUSCULOSKELETAL: ROM of left upper extremity (elbow, wrist, fingers) limited due to pain. Limited movement toes bilaterally. Strength grossly intact.  NEUROLOGIC: Alert and oriented x 3. Peripheral neuropathy bilaterally of upper extremities to elbow. Peripheral neuropathy bilaterally of lower extremities to right below the knee. Otherwise sensory grossly intact SKIN:  Left upper extremity erythema and edema with warmth, starting at elbow to fingers. +Tenderness, most pronounced at elbow. No crepitus or fluctuance.   Chronic venous stasis ulcers bilateral shins, with drainage. 2+ edema to knee bilaterally. No fluctuance or crepitance. Lesion with serous drainage on right medial maleolus, at location where his slippers contact his skin. No evidence of diabetic ulcers on feet, + calluses.  I personally reviewed lab work that was obtained in last 24 hrs.  CT OF THE UPPER LEFT EXTREMITY WITH CONTRAST 01/03/2019:  1. Diffuse edema/cellulitis extending to of the entirety of the left arm and forearm, predominantly along the posterior aspect of the arm through the elbow. There is no evidence of an abscess. There are no CT findings to suggest fasciitis.  2. No other abnormalities. No evidence of osteomyelitis. No bone or joint abnormality.  MEDICATIONS: I have reviewed all medications and confirmed regimen as documented   CULTURE RESULTS   Recent Results (from the past 240 hour(s))   SARS Coronavirus 2 (CEPHEID - Performed in [place] hospital lab), Hosp Order     Status: None   Collection Time: 01/01/19  5:03 PM   Specimen: Nasopharyngeal Swab  Result Value Ref Range Status   SARS Coronavirus 2 NEGATIVE NEGATIVE Final    Comment: (NOTE) If result is NEGATIVE SARS-CoV-2 target nucleic acids are NOT DETECTED. The SARS-CoV-2 RNA is generally detectable in upper and lower  respiratory specimens during the acute phase of infection. The lowest  concentration of SARS-CoV-2 viral copies this assay can detect is 250  copies / mL. A negative result does not preclude SARS-CoV-2 infection  and should not be used as the sole basis for treatment or other  patient management decisions.  A negative result may occur with  improper specimen collection / handling, submission of specimen other  than nasopharyngeal swab, presence of viral mutation(s) within the  areas targeted by this assay, and inadequate number of viral copies  (<250 copies / mL). A negative result must be combined with  clinical  observations, patient history, and epidemiological information. If result is POSITIVE SARS-CoV-2 target nucleic acids are DETECTED. The SARS-CoV-2 RNA is generally detectable in upper and lower  respiratory specimens dur ing the acute phase of infection.  Positive  results are indicative of active infection with SARS-CoV-2.  Clinical  correlation with patient history and other diagnostic information is  necessary to determine patient infection status.  Positive results do  not rule out bacterial infection or co-infection with other viruses. If result is PRESUMPTIVE POSTIVE SARS-CoV-2 nucleic acids MAY BE PRESENT.   A presumptive positive result was obtained on the submitted specimen  and confirmed on repeat testing.  While 2019 novel coronavirus  (SARS-CoV-2) nucleic acids may be present in the submitted sample  additional confirmatory testing may be necessary for epidemiological   and / or clinical management purposes  to differentiate between  SARS-CoV-2 and other Sarbecovirus currently known to infect humans.  If clinically indicated additional testing with an alternate test  methodology [phone]) is advised. The SARS-CoV-2 RNA is generally  detectable in upper and lower respiratory sp ecimens during the acute  phase of infection. The expected result is Negative. Fact Sheet for Patients:  StrictlyIdeas.no Fact Sheet for Healthcare Providers: BankingDealers.co.za This test is not yet approved or cleared by the [place] FDA and has been authorized for detection and/or diagnosis of SARS-CoV-2 by FDA under an Emergency Use Authorization (EUA).  This EUA will remain in effect (meaning this test can be used) for the duration of the COVID-19 declaration under Section 564(b)(1) of the Act, 21 U.S.C. section 360bbb-3(b)(1), unless the authorization is terminated or revoked sooner. Performed at [facility], [address] [place], [place] [address]   MRSA PCR Screening     Status: None   Collection Time: 01/01/19  8:41 PM   Specimen: Nasal Mucosa; Nasopharyngeal  Result Value Ref Range Status   MRSA by PCR NEGATIVE NEGATIVE Final    Comment:        The GeneXpert MRSA Assay (FDA approved for NASAL specimens only), is one component of a comprehensive MRSA colonization surveillance program. It is not intended to diagnose MRSA infection nor to guide or monitor treatment for MRSA infections. Performed at [facility], [address] [place], [place] [address]         Current Facility-Administered Medications:  .  ALPRAZolam Duanne Moron) tablet 0.5 mg, 0.5 mg, Oral, Q8H PRN, Loletha Grayer, MD, 0.5 mg at 01/09/19 0041 .  amiodarone (PACERONE) tablet 100 mg, 100 mg, Oral, BID, Leslye Peer, Richard, MD, 100 mg at 01/08/19 2258 .  atorvastatin (LIPITOR) tablet 80 mg, 80 mg, Oral, QHS, Wieting,  Richard, MD, 80 mg at 01/08/19 2249 .  budesonide (PULMICORT) nebulizer solution 0.5 mg, 0.5 mg, Nebulization, BID, Leslye Peer, Richard, MD, 0.5 mg at 01/08/19 2014 .  busPIRone (BUSPAR) tablet 10 mg, 10 mg, Oral, BID PRN, Loletha Grayer, MD, 10 mg at 01/08/19 1335 .  Chlorhexidine Gluconate Cloth 2 % PADS 6 each, 6 each, Topical, Daily, Flora Lipps, MD, 6 each at 01/08/19 0956 .  ciprofloxacin (CIPRO) tablet 500 mg, 500 mg, Oral, BID, Dallie Piles, RPH, 500 mg at 01/08/19 2038 .  collagenase (SANTYL) ointment, , Topical, Daily, Wieting, Richard, MD .  dexamethasone (DECADRON) tablet 4 mg, 4 mg, Oral, Q8H, Dallie Piles, RPH, 4 mg at 01/09/19 0555 .  diphenhydrAMINE (BENADRYL) capsule 25 mg, 25 mg, Oral, Q6H PRN, Flora Lipps, MD, 25 mg at 01/08/19 2301 .  enalapril (VASOTEC)  tablet 2.5 mg, 2.5 mg, Oral, Daily, Leslye Peer, Richard, MD, 2.5 mg at 01/08/19 0956 .  feeding supplement (GLUCERNA SHAKE) (GLUCERNA SHAKE) liquid 237 mL, 237 mL, Oral, BID BM, Leslye Peer, Richard, MD, 237 mL at 01/04/19 1514 .  folic acid (FOLVITE) tablet 1 mg, 1 mg, Oral, Daily, Wieting, Richard, MD, 1 mg at 01/08/19 0954 .  furosemide (LASIX) tablet 40 mg, 40 mg, Oral, Daily, Awilda Bill, NP, 40 mg at 01/08/19 0956 .  gabapentin (NEURONTIN) capsule 1,600 mg, 1,600 mg, Oral, BID, Leslye Peer, Richard, MD, 1,600 mg at 01/08/19 2248 .  ibuprofen (ADVIL) tablet 400 mg, 400 mg, Oral, Q6H PRN, Wilhelmina Mcardle, MD, 400 mg at 01/02/19 1831 .  insulin aspart (novoLOG) injection 0-5 Units, 0-5 Units, Subcutaneous, QHS, Loletha Grayer, MD, 5 Units at 01/08/19 2301 .  insulin aspart (novoLOG) injection 0-9 Units, 0-9 Units, Subcutaneous, TID WC, Loletha Grayer, MD, 9 Units at 01/08/19 1817 .  insulin aspart (novoLOG) injection 10 Units, 10 Units, Subcutaneous, TID WC, Flora Lipps, MD, 10 Units at 01/08/19 1818 .  insulin detemir (LEVEMIR) injection 35 Units, 35 Units, Subcutaneous, BID, Flora Lipps, MD, 35 Units at 01/08/19 2304  .  ipratropium-albuterol (DUONEB) 0.5-2.5 (3) MG/3ML nebulizer solution 3 mL, 3 mL, Nebulization, TID, Leslye Peer, Richard, MD, 3 mL at 01/08/19 2014 .  lamoTRIgine (LAMICTAL) tablet 150 mg, 150 mg, Oral, BID, Leslye Peer, Richard, MD, 150 mg at 01/08/19 2251 .  LORazepam (ATIVAN) injection 1 mg, 1 mg, Intravenous, Once, Vaughan Basta, MD, Stopped at 01/03/19 1321 .  metFORMIN (GLUCOPHAGE-XR) 24 hr tablet 1,000 mg, 1,000 mg, Oral, BID WC, Wieting, Richard, MD, 1,000 mg at 01/08/19 1817 .  metoprolol tartrate (LOPRESSOR) tablet 50 mg, 50 mg, Oral, BID, Wilhelmina Mcardle, MD, 50 mg at 01/08/19 2251 .  multivitamin with minerals tablet 1 tablet, 1 tablet, Oral, Daily, Loletha Grayer, MD, 1 tablet at 01/08/19 0954 .  oxyCODONE-acetaminophen (PERCOCET/ROXICET) 5-325 MG per tablet 2 tablet, 2 tablet, Oral, Q6H PRN, Loletha Grayer, MD, 2 tablet at 01/08/19 1824 .  pantoprazole (PROTONIX) EC tablet 40 mg, 40 mg, Oral, Daily, Leslye Peer, Richard, MD, 40 mg at 01/08/19 0954 .  polyethylene glycol (MIRALAX / GLYCOLAX) packet 17 g, 17 g, Oral, Daily PRN, Leslye Peer, Richard, MD .  polyethylene glycol (MIRALAX / GLYCOLAX) packet 17 g, 17 g, Oral, Once, Wieting, Richard, MD .  rivaroxaban (XARELTO) tablet 20 mg, 20 mg, Oral, Daily, Leslye Peer, Richard, MD, 20 mg at 01/08/19 0955 .  senna-docusate (Senokot-S) tablet 2 tablet, 2 tablet, Oral, QHS PRN, Leslye Peer, Richard, MD .  senna-docusate (Senokot-S) tablet 2 tablet, 2 tablet, Oral, Once, Wieting, Richard, MD .  sulfamethoxazole-trimethoprim (BACTRIM DS) 800-160 MG per tablet 1 tablet, 1 tablet, Oral, Q12H, Dallie Piles, RPH, 1 tablet at 01/08/19 2259 .  thiamine (VITAMIN B-1) tablet 100 mg, 100 mg, Oral, Daily, Wieting, Richard, MD, 100 mg at 01/08/19 0954  Discharged on CIPRO 500 mg BID for 5 days BACTRIM 1 TAB twice daily for 5 day  ASSESSMENT AND PLAN SYNOPSIS: 58 year old Caucasian male admitted with severe sepsis secondary to multi-extremity cellulitis,  improving with antibiotics and corticosteroids. No evidence of abscess, fascitis or osteomyelitis on CT UE.  CELLULITIS - Improving - Patient notes continued pain of LUE that is steadily improving.  -No evidence of more invasive infection on CT.  - Continue IV morphine with plans to transition to PO percocet before discharge.  - Continue PO antibiotics (Cipro and Bactrim), and will start PO dexamethasone today. -  Rx for diabetic footwear provided.   CARDIAC - Afib well controlled on Amiodarone.   GI GI PROPHYLAXIS as indicated  NUTRITIONAL STATUS DIET--> Regular Constipation protocol as indicated  ENDO - will use ICU hypoglycemic\Hyperglycemia protocol if needed  ELECTROLYTES -follow labs as needed -replace as needed -pharmacy consultation and following   DVT/GI PRX ordered TRANSFUSIONS AS NEEDED MONITOR FSBS ASSESS the need for LABS   Disposition: Will discharge home with PO antibiotics and steroids. Patient has nocturnal ventilation capabilities at home. Plan for PT outpatient services and wound care outpatient.   Corrin Parker, M.D.  [facility]  Medical Director [place] Director [facility] Cardio-Pulmonary Department

## 2019-01-09 NOTE — Progress Notes (Signed)
[facility] [place] NAME: [name]    MR#:  [id number]  DATE OF BIRTH:  04/09/1961  SUBJECTIVE:  CHIEF COMPLAINT:  No chief complaint on file.  Swelling in the arm and elbow is improved  REVIEW OF SYSTEMS:  CONSTITUTIONAL: No fever, fatigue or weakness.  EYES: No blurred or double vision.  EARS, NOSE, AND THROAT: No tinnitus or ear pain.  RESPIRATORY: No cough, shortness of breath, wheezing or hemoptysis.  CARDIOVASCULAR: No chest pain, orthopnea, edema.  GASTROINTESTINAL: No nausea, vomiting, diarrhea or abdominal pain.  GENITOURINARY: No dysuria, hematuria.  ENDOCRINE: No polyuria, nocturia,  HEMATOLOGY: No anemia, easy bruising or bleeding SKIN: No rash or lesion. MUSCULOSKELETAL: No joint pain or arthritis.   NEUROLOGIC: No tingling, numbness, weakness.  PSYCHIATRY: No anxiety or depression.   ROS  DRUG ALLERGIES:   Allergies  Allergen Reactions  . Cephalexin     Other reaction(s): Unknown  . Hctz  [Hydrochlorothiazide]     Other reaction(s): Other (See Comments) hyponatremia  . Hydralazine   . Penicillins Hives    Has patient had a PCN reaction causing immediate rash, facial/tongue/throat swelling, SOB or lightheadedness with hypotension: Yes Has patient had a PCN reaction causing severe rash involving mucus membranes or skin necrosis: Yes Has patient had a PCN reaction that required hospitalization: Unknown Has patient had a PCN reaction occurring within the last 10 years: No If all of the above answers are "NO", then may proceed with Cephalosporin use.   Marland Kitchen Reserpine   . Zaroxolyn [Metolazone]     Other reaction(s): Unknown    VITALS:  Blood pressure 138/82, pulse 90, temperature 98.1 F (36.7 C), resp. rate 18, height 6' 0.01" (1.829 m), weight (!) 136.8 kg, SpO2 100 %.  PHYSICAL EXAMINATION:  GENERAL:  58 y.o.-year-old morbidly obese patient lying in the bed with no acute distress.  EYES: Pupils equal, round,  reactive to light and accommodation. No scleral icterus. Extraocular muscles intact.  HEENT: Head atraumatic, normocephalic. Oropharynx and nasopharynx clear.  NECK:  Supple, no jugular venous distention. No thyroid enlargement, no tenderness.  Tracheostomy in place. LUNGS: Normal breath sounds bilaterally, no wheezing, rales,rhonchi or crepitation. No use of accessory muscles of respiration.  CARDIOVASCULAR: S1, S2 normal. No murmurs, rubs, or gallops.  ABDOMEN: Soft, nontender, nondistended. Bowel sounds present. No organomegaly or mass.  EXTREMITIES: Bilateral some pedal edema, cyanosis, or clubbing.  Both legs and left upper extremity has swelling that is improved able to move fingers on left hand. NEUROLOGIC: Cranial nerves II through XII are intact. Muscle strength 5/5 in all extremities. Sensation intact. Gait not checked.  PSYCHIATRIC: [name].  SKIN: No obvious rash, lesion, or ulcer.    Physical Exam LABORATORY PANEL:   CBC Recent Labs  Lab 01/09/19 0543  WBC 11.9*  HGB 12.8*  HCT 38.5*  PLT 391   ------------------------------------------------------------------------------------------------------------------  Chemistries  Recent Labs  Lab 01/09/19 0543  NA 131*  K 5.0  CL 93*  CO2 28  GLUCOSE 301*  BUN 39*  CREATININE 0.98  CALCIUM 9.8  MG 1.9   ------------------------------------------------------------------------------------------------------------------  Cardiac Enzymes No results for input(s): TROPONINI in the last 168 hours. ------------------------------------------------------------------------------------------------------------------  RADIOLOGY:  No results found.  ASSESSMENT AND PLAN:   Active Problems:   Cellulitis  1.  Cellulitis left arm and right lower extremity.  Patient has allergy to penicillin and Keflex.  Continue Vancomycin and Cipro ordered.  discharge home  on oral antibiotics As he has persistent  swelling and complaint of pain get CT scan of upper extremity to rule out deep infections. CT scan was negative for any drainable abscess or fasciitis. A continue steroids , discharge today. 2.  Morbid obesity with sleep apnea.  Patient states that he sleeps with a ventilator at home. Monitor in CCU. 3.  Morbid obesity with a BMI of 40.69.  Weight loss needed. 4.  Type 2 diabetes mellitus put on sliding scale insulin and Lantus. 5.  Chronic diastolic congestive heart failure and edema.  Continue Lasix  6.  History of CAD and paroxysmal atrial fibrillation.    Continue amiodarone and metoprolol and Lipitor  7.    Alcohol use recommended to decrease alcohol intake 8.  Diabetic neuropathy continue gabapentin  CODE STATUS: Full.  TOTAL TIME TAKING CARE OF THIS PATIENT: 35 minutes.     D/C today   Altamese DillingVaibhavkumar Kervens Roper M.D on 01/09/2019   Between 7am to 6pm - Pager - [phone]  After 6pm go to [email or web address] - password [email or web address]  [facility]  Office  [phone]  CC: Primary care physician; Hillery AldoPatel, Sarah, MD  Note: This dictation was prepared with Dragon dictation along with smaller phrase technology. Any transcriptional errors that result from this process are unintentional.

## 2019-01-09 NOTE — TOC Transition Note (Signed)
Transition of Care [facility]) - CM/SW Discharge Note   Patient Details  Name: Zyair Russi MRN: [id number] Date of Birth: [birth year]  Transition of Care [facility] CM/SW Contact:  Latanya Maudlin, RN Phone Number: 01/09/2019, 11:03 AM   Clinical Narrative:   Patient is being recommended outpatient PT/OT. He is agreeable. I have provided him a list of all outpatient clinics in the area, patient prefers to research them and see what places can work with his scheduled. Patient also in need of bariatric walker. Orders obtained and scheduled for home delivery from [place] [facility].      Final next level of care: Home/Self Care Barriers to Discharge: No Barriers Identified   Patient Goals and CMS Choice   CMS Medicare.gov Compare Post Acute Care list provided to:: Patient Choice offered to / list presented to : Patient  Discharge Placement                       Discharge Plan and Services                DME Arranged: [facility] D[facility] Agency: Other - Comment([facility]                  Social Determinants of Health (SDOH) Interventions     Readmission Risk Interventions No flowsheet data found.

## 2019-01-09 NOTE — Progress Notes (Signed)
1320 Discharge home with fiance. Discharge instructions read by patient at 1300 and questions answered. Discharge instructions left at hospital. Called patient at home and fiance refused to come to hospital and get them. Stated she was a CNA and she could care for him. We could email them to patient but she wasn't coming back to hospital.

## 2019-01-11 NOTE — Care Management (Addendum)
TOC team has received several calls from patient in regards to not receiving his bariatric rolling walker. I have spoken with both Lennette Bihari and Eustaquio Maize at [facility] home patient and they were not able to process the order for DME rolling walker since it is not stated in the discharge summary. There is an MD order as well as a PT recommendation for bariatric walker and that was faxed to [place] [facility] on Saturday. Since they are not able to process this order and the discharge is complete we will use another agency. I have provided DME referral to [facility] at [place] and he will attempt to process this order. The patient may have to obtain from a retail store but since there was an active order it may still be able to be processed. Patient also inquiring about elevated toilet seat unfortunately we cannot assist with this as it was not ordered for the patient here in the hospital. Additionally, the patient requests home health, PT recommended outpatient and I was able to provide him a list of outpatient clinics in the area to follow up with. However, as PT did not rec HH and there were no home health orders outpatient PT was the plan. If patient wants home health or further DME it will require an MD order.    Update- [place] from [place] will be able to take for home health. She plans to reach out to PCP for home health orders.

## 2019-02-04 ENCOUNTER — Telehealth: Payer: Self-pay | Admitting: Family Medicine

## 2019-02-04 NOTE — Telephone Encounter (Signed)
Pt Left overnight message stating he wanted to become a new patient called patient left msg to call back during office hours

## 2019-04-08 ENCOUNTER — Telehealth: Payer: Self-pay | Admitting: *Deleted

## 2019-04-08 ENCOUNTER — Encounter: Payer: Self-pay | Admitting: *Deleted

## 2019-04-08 NOTE — Telephone Encounter (Signed)
Received referral for low dose lung cancer screening CT scan. Message left at phone number listed in EMR for patient to call me back to facilitate scheduling scan.  

## 2019-04-26 ENCOUNTER — Encounter: Payer: Medicare HMO | Attending: Family Medicine

## 2019-04-26 ENCOUNTER — Other Ambulatory Visit: Payer: Self-pay

## 2019-04-26 DIAGNOSIS — I5032 Chronic diastolic (congestive) heart failure: Secondary | ICD-10-CM

## 2019-04-26 NOTE — Progress Notes (Signed)
Virtual Orientation performed. Patient informed when to come in for RD and EP orientation.  

## 2019-05-06 ENCOUNTER — Ambulatory Visit: Payer: Medicare HMO

## 2019-05-13 ENCOUNTER — Ambulatory Visit: Payer: Medicare HMO

## 2019-05-19 ENCOUNTER — Ambulatory Visit: Payer: Medicare HMO

## 2019-05-24 ENCOUNTER — Telehealth: Payer: Self-pay | Admitting: *Deleted

## 2019-05-24 NOTE — Telephone Encounter (Signed)
Attempted to cal Clair Gulling as he missed his 4th appointment for the Pulmonary Rehab Orientation.  No answer.

## 2019-05-24 NOTE — Telephone Encounter (Signed)
[name] returned call.  We reviewed his trach and how he has to clear it frequently during class.  With the COVID 19 Infection Prevention guidelines this activity would not be safe in the open gym.  Also Syd was not sdure how he would be able to wear a mask over his trach. We discussed that placing him on hold until January to see what the situation is here in the gym to see if we can admit him then to the program.

## 2019-09-17 ENCOUNTER — Ambulatory Visit: Payer: Medicare HMO

## 2019-09-20 ENCOUNTER — Ambulatory Visit: Payer: Medicare HMO

## 2019-12-23 DEATH — deceased
